# Patient Record
Sex: Male | Born: 1945
Health system: Southern US, Community
[De-identification: ages and names within clinical notes are randomized; demographics above are authoritative.]

## PROBLEM LIST (undated history)

## (undated) DIAGNOSIS — K5903 Drug induced constipation: Secondary | ICD-10-CM

## (undated) DIAGNOSIS — I739 Peripheral vascular disease, unspecified: Secondary | ICD-10-CM

## (undated) DIAGNOSIS — Z720 Tobacco use: Secondary | ICD-10-CM

## (undated) DIAGNOSIS — R0602 Shortness of breath: Secondary | ICD-10-CM

## (undated) DIAGNOSIS — M545 Low back pain, unspecified: Secondary | ICD-10-CM

## (undated) DIAGNOSIS — I10 Essential (primary) hypertension: Secondary | ICD-10-CM

## (undated) DIAGNOSIS — R634 Abnormal weight loss: Secondary | ICD-10-CM

## (undated) DIAGNOSIS — G709 Myoneural disorder, unspecified: Secondary | ICD-10-CM

## (undated) DIAGNOSIS — S81809A Unspecified open wound, unspecified lower leg, initial encounter: Secondary | ICD-10-CM

## (undated) DIAGNOSIS — J449 Chronic obstructive pulmonary disease, unspecified: Secondary | ICD-10-CM

## (undated) DIAGNOSIS — G8929 Other chronic pain: Secondary | ICD-10-CM

## (undated) DIAGNOSIS — J439 Emphysema, unspecified: Secondary | ICD-10-CM

## (undated) DIAGNOSIS — F419 Anxiety disorder, unspecified: Secondary | ICD-10-CM

## (undated) HISTORY — DX: Chronic obstructive pulmonary disease, unspecified: J44.9

## (undated) HISTORY — DX: Low back pain: M54.5

## (undated) HISTORY — DX: Other chronic pain: G89.29

## (undated) HISTORY — PX: OTHER SURGICAL HISTORY: SHX169

## (undated) HISTORY — DX: Peripheral vascular disease, unspecified: I73.9

## (undated) HISTORY — DX: Essential (primary) hypertension: I10

## (undated) HISTORY — DX: Anxiety disorder, unspecified: F41.9

## (undated) HISTORY — DX: Low back pain, unspecified: M54.50

## (undated) HISTORY — DX: Tobacco use: Z72.0

## (undated) HISTORY — DX: Abnormal weight loss: R63.4

---

## 2001-05-19 HISTORY — PX: APPENDECTOMY: SHX54

## 2013-04-09 ENCOUNTER — Ambulatory Visit (HOSPITAL_COMMUNITY)
Admission: RE | Admit: 2013-04-09 | Discharge: 2013-04-09 | Disposition: A | Discharge status: Home / Self Care | Payer: Medicare Other | Source: Ambulatory Visit | Attending: Orthopaedic Surgery | Admitting: Orthopaedic Surgery

## 2013-04-09 ENCOUNTER — Other Ambulatory Visit (HOSPITAL_COMMUNITY): Payer: Self-pay | Admitting: Orthopedic Surgery

## 2013-04-09 DIAGNOSIS — M79671 Pain in right foot: Secondary | ICD-10-CM

## 2013-04-09 DIAGNOSIS — M79609 Pain in unspecified limb: Secondary | ICD-10-CM

## 2013-04-09 DIAGNOSIS — I739 Peripheral vascular disease, unspecified: Secondary | ICD-10-CM | POA: Insufficient documentation

## 2013-04-09 NOTE — Progress Notes (Signed)
VASCULAR LAB PRELIMINARY  ARTERIAL  ABI completed:    RIGHT    LEFT    PRESSURE WAVEFORM  PRESSURE WAVEFORM  BRACHIAL 134 triphasic BRACHIAL 139 triphasic  DP   DP    AT 44 Dampened monophasic AT 79 Dampened monophasic  PT 48 Dampened monophasic PT 30 Dampened monophasic  PER   PER    GREAT TOE  NA GREAT TOE  NA    RIGHT LEFT  ABI 0.35 0.57     Aeon Koors, RVT 04/09/2013, 9:57 AM

## 2013-04-13 ENCOUNTER — Ambulatory Visit (INDEPENDENT_AMBULATORY_CARE_PROVIDER_SITE_OTHER): Payer: Medicare Other | Admitting: Internal Medicine

## 2013-04-13 ENCOUNTER — Encounter: Payer: Self-pay | Admitting: Internal Medicine

## 2013-04-13 VITALS — BP 108/68 | HR 70 | Ht 68.0 in | Wt 136.8 lb

## 2013-04-13 DIAGNOSIS — F419 Anxiety disorder, unspecified: Secondary | ICD-10-CM

## 2013-04-13 DIAGNOSIS — J449 Chronic obstructive pulmonary disease, unspecified: Secondary | ICD-10-CM

## 2013-04-13 DIAGNOSIS — M545 Low back pain, unspecified: Secondary | ICD-10-CM

## 2013-04-13 DIAGNOSIS — I739 Peripheral vascular disease, unspecified: Secondary | ICD-10-CM

## 2013-04-13 DIAGNOSIS — Z139 Encounter for screening, unspecified: Secondary | ICD-10-CM

## 2013-04-13 DIAGNOSIS — F411 Generalized anxiety disorder: Secondary | ICD-10-CM

## 2013-04-13 DIAGNOSIS — I10 Essential (primary) hypertension: Secondary | ICD-10-CM

## 2013-04-13 DIAGNOSIS — F17200 Nicotine dependence, unspecified, uncomplicated: Secondary | ICD-10-CM

## 2013-04-13 HISTORY — DX: Peripheral vascular disease, unspecified: I73.9

## 2013-04-13 NOTE — Patient Instructions (Addendum)
You have been referred to Dr. Nanetta Batty. Please schedule for next week.   Your physician has requested that you have a lower extremity arterial duplex. This test is an ultrasound of the arteries in the legs. It looks at arterial blood flow in the legs. Allow one hour for Lower Arterial scans. There are no restrictions or special instructions  Your physician has requested that you have an abdominal aorta duplex. During this test, an ultrasound is used to evaluate the aorta. Allow 30 minutes for this exam. Do not eat after midnight the day before and avoid carbonated beverages

## 2013-04-13 NOTE — Progress Notes (Signed)
OFFICE NOTE  Chief Complaint:  Leg pain, claudication  Primary Care Physician: No PCP Per Patient  HPI:  Cleon Signorelli is a 67 year old male with a long-standing history of smoking and COPD, hypertension, anxiety, and chronic low back pain managed by a pain clinic. He has been seen recently both at the gym at urgent care and by a podiatrist for foot pain and a sore over the left greater toe. The toe eventually healed but he underwent a surgical procedure to remove part of the nail. The podiatrist prescribed him some medications that he put on topically which caused pain over this so sore on his great toe. He also reports an open sore that came up spontaneously on the sole of the right foot which has since healed. He does report that he's had aching in his calves pain after walking 20-30 feet which improves when he stops. He is on chronic oxycodone as managed by his pain specialist. He has not had any blood work recently. He underwent ABIs recently which showed severe reduction in blood flow bilaterally. He is now referred for evaluation.  PMHx:  Past Medical History  Diagnosis Date  . Chronic low back pain   . COPD (chronic obstructive pulmonary disease)   . Hypertension   . Anxiety     Past Surgical History  Procedure Laterality Date  . Appendectomy    . Abi      04/09/13; abnormal    FAMHx:  No family history on file.  SOCHx:   reports that he has been smoking Cigarettes.  He has a 50 pack-year smoking history. He has never used smokeless tobacco. He reports that he does not drink alcohol or use illicit drugs.  ALLERGIES:  No Known Allergies  ROS: A comprehensive review of systems was negative except for: Cardiovascular: positive for lower extremity edema Musculoskeletal: positive for claudication, leg pain  HOME MEDS: Current Outpatient Prescriptions  Medication Sig Dispense Refill  . ALPRAZolam (XANAX) 1 MG tablet Take 1 mg by mouth 3 (three) times daily.      .  IBUPROFEN PO Take by mouth as needed.      . Oxycodone HCl 10 MG TABS Take 10 mg by mouth every 4 (four) hours.       No current facility-administered medications for this visit.    LABS/IMAGING: No results found for this or any previous visit (from the past 48 hour(s)). No results found.  VITALS: BP 108/68  Pulse 70  Ht 5\' 8"  (1.727 m)  Wt 136 lb 12.8 oz (62.052 kg)  BMI 20.81 kg/m2  EXAM: General appearance: alert and no distress Neck: no adenopathy, no carotid bruit, no JVD, supple, symmetrical, trachea midline and thyroid not enlarged, symmetric, no tenderness/mass/nodules Lungs: clear to auscultation bilaterally Heart: regular rate and rhythm, S1, S2 normal, no murmur, click, rub or gallop Abdomen: soft, non-tender; bowel sounds normal; no masses,  no organomegaly and no pulsatile masses Extremities: edema 1+ right, there is erythema of both feet which are cool, there is a healed sore on the tip of the left great toe. There is a healed ulcer on the sole of the right foot. Pulses: faint, not easily palpable Skin: pale, erythma over the feet Neurologic: Grossly normal  EKG: Sinus rhythm at 70  ASSESSMENT: 1. Symptomatic PAD with claudication, ulcers  PLAN: 1. [name] has significant PAD with pain in both legs. He's been on chronic pain management for low back problems, and probably has some neuropathy as well.  He is ABIs are severely reduced, but he did not have vein mapping. He's also been a smoker and his age of 39, should have a screening abdominal aortic ultrasound as well. I'll go ahead and order this as well as bilateral arterial venous mapping. We'll also obtain a CBC and BMP to evaluate candidacy for peripheral angiography. I plan to refer him to Dr. Allyson Sabal in our practice, who specializes in peripheral arterial disease.  He has been taking aspirin 162 mg daily which I encouraged him to continue. Again he is not reporting any significant rest pain, but does have  lifestyle-limiting claudication. In addition he has sores which are been slow to heal and are in typical arterial ulcer positions.  Chrystie Nose, MD, [facility] Attending Cardiologist [facility]  Javone Ybanez C 04/13/2013, 4:12 PM

## 2013-04-16 ENCOUNTER — Ambulatory Visit (HOSPITAL_COMMUNITY)
Admission: RE | Admit: 2013-04-16 | Discharge: 2013-04-16 | Disposition: A | Discharge status: Home / Self Care | Payer: Medicare Other | Source: Ambulatory Visit | Attending: Cardiovascular Disease | Admitting: Cardiovascular Disease

## 2013-04-16 DIAGNOSIS — F17200 Nicotine dependence, unspecified, uncomplicated: Secondary | ICD-10-CM

## 2013-04-16 DIAGNOSIS — I739 Peripheral vascular disease, unspecified: Secondary | ICD-10-CM | POA: Insufficient documentation

## 2013-04-16 NOTE — Progress Notes (Signed)
Abdominal Aortic Duplex Completed °Brianna L Mazza,RVT °

## 2013-04-16 NOTE — Progress Notes (Signed)
Lower Extremity Arterial Duplex Completed. °Brianna L Mazza,RVT °

## 2013-04-17 LAB — CBC
HCT: 41.7 % (ref 39.0–52.0)
Hemoglobin: 14.4 g/dL (ref 13.0–17.0)
MCHC: 34.5 g/dL (ref 30.0–36.0)
RBC: 4.46 MIL/uL (ref 4.22–5.81)

## 2013-04-17 LAB — BASIC METABOLIC PANEL
BUN: 16 mg/dL (ref 6–23)
CO2: 32 mEq/L (ref 19–32)
Chloride: 102 mEq/L (ref 96–112)
Glucose, Bld: 110 mg/dL — ABNORMAL HIGH (ref 70–99)
Potassium: 4.1 mEq/L (ref 3.5–5.3)
Sodium: 138 mEq/L (ref 135–145)

## 2013-04-21 ENCOUNTER — Telehealth: Payer: Self-pay | Admitting: Internal Medicine

## 2013-04-21 NOTE — Telephone Encounter (Signed)
Would like to know is there anything she should be doing , should he walking or should he be resting because of the pain in the muslces of his legs.. Please Call    Thanks

## 2013-04-21 NOTE — Telephone Encounter (Signed)
Walk as much as he can, stop when it hurts. Has appointment with Dr. Allyson Sabal. Can't do much else for him right now.  -Dr. Rennis Golden

## 2013-04-21 NOTE — Telephone Encounter (Signed)
Reviewed last OV note w/ Dr. Rennis Golden.  Pt to have tests completed and f/u with Dr. Allyson Sabal.  Appt on 9.10.14 w/ Dr. Allyson Sabal.    Message forwarded to Dr. Rennis Golden for further instructions.

## 2013-04-21 NOTE — Telephone Encounter (Signed)
Will await return call.

## 2013-04-28 ENCOUNTER — Ambulatory Visit (INDEPENDENT_AMBULATORY_CARE_PROVIDER_SITE_OTHER): Payer: Medicare Other | Admitting: Cardiovascular Disease

## 2013-04-28 ENCOUNTER — Encounter: Payer: Self-pay | Admitting: Cardiovascular Disease

## 2013-04-28 VITALS — BP 140/90 | HR 80 | Ht 68.0 in | Wt 132.0 lb

## 2013-04-28 DIAGNOSIS — R5381 Other malaise: Secondary | ICD-10-CM

## 2013-04-28 DIAGNOSIS — R5383 Other fatigue: Secondary | ICD-10-CM

## 2013-04-28 DIAGNOSIS — D689 Coagulation defect, unspecified: Secondary | ICD-10-CM

## 2013-04-28 DIAGNOSIS — R3911 Hesitancy of micturition: Secondary | ICD-10-CM

## 2013-04-28 DIAGNOSIS — I739 Peripheral vascular disease, unspecified: Secondary | ICD-10-CM

## 2013-04-28 DIAGNOSIS — R634 Abnormal weight loss: Secondary | ICD-10-CM | POA: Insufficient documentation

## 2013-04-28 DIAGNOSIS — Z01818 Encounter for other preprocedural examination: Secondary | ICD-10-CM

## 2013-04-28 DIAGNOSIS — R0602 Shortness of breath: Secondary | ICD-10-CM

## 2013-04-28 DIAGNOSIS — Z79899 Other long term (current) drug therapy: Secondary | ICD-10-CM

## 2013-04-28 DIAGNOSIS — F17200 Nicotine dependence, unspecified, uncomplicated: Secondary | ICD-10-CM

## 2013-04-28 DIAGNOSIS — Z720 Tobacco use: Secondary | ICD-10-CM

## 2013-04-28 NOTE — Assessment & Plan Note (Signed)
Patient has a 2 to three-month history of lifestyle limiting claudication. He has critical limb ischemia with painful feet and nonhealing ulcer on his left great toe as well as the dorsum of his right foot. He had arterial Dopplers performed in our office 04/16/13 revealing an ABI of point in the right with an occluded distal right SFA popliteal artery a right ABI of 0.38 with an occluded left SFA and popliteal artery.I'm going to obtain a pharmacologic Myoview stress test to stratify him prior to a cholangiogram and potential endovascular intervention for critical limb ischemia.

## 2013-04-28 NOTE — Progress Notes (Signed)
04/28/2013 Si Jachim   04/01/1946  [id number]  Primary Physician No PCP Per Patient Primary Cardiologist: Runell Gess MD [name]   HPI:  Christopher Pena is a 67 year old thin and cachectic appearing divorced Caucasian male father of 2, grandfather 57 grandchildren is a retired Customer service manager. He is referred by Dr. Rennis Golden for cardiovascular evaluation. Factors include 50-100-pack-year history of tobacco use having smoked one pack a day recently and tried to stop the cigarettes. He does have hypertension. He's never had a heart attack or stroke. He does of COPD. Colestid 2 months that is lifestyle limiting claudication which has gotten worse now unable to walk more than 20 yards. He does have resting pain at night. Dopplers performed in office on 04/16/13 revealed a right ABI 0.23 and left appropriate suggesting critical limb ischemia.   Current Outpatient Prescriptions  Medication Sig Dispense Refill  . ALPRAZolam (XANAX) 1 MG tablet Take 1 mg by mouth 3 (three) times daily.      . IBUPROFEN PO Take by mouth as needed.      . Oxycodone HCl 10 MG TABS Take 10 mg by mouth every 4 (four) hours.       No current facility-administered medications for this visit.    No Known Allergies  History   Social History  . Marital Status: Divorced    Spouse Name: N/A    Number of Children: N/A  . Years of Education: N/A   Occupational History  . former golf pro    Social History Main Topics  . Smoking status: Current Every Day Smoker -- 1.00 packs/day for 50 years    Types: Cigarettes  . Smokeless tobacco: Never Used  . Alcohol Use: No  . Drug Use: No  . Sexual Activity: Not on file   Other Topics Concern  . Not on file   Social History Narrative  . No narrative on file     Review of Systems: General: negative for chills, fever, night sweats or weight changes.  Cardiovascular: negative for chest pain, dyspnea on exertion, edema, orthopnea, palpitations,  paroxysmal nocturnal dyspnea or shortness of breath Dermatological: negative for rash Respiratory: negative for cough or wheezing Urologic: negative for hematuria Abdominal: negative for nausea, vomiting, diarrhea, bright red blood per rectum, melena, or hematemesis Neurologic: negative for visual changes, syncope, or dizziness All other systems reviewed and are otherwise negative except as noted above.    Blood pressure 140/90, pulse 80, height 5\' 8"  (1.727 m), weight 132 lb (59.875 kg).  General appearance: alert and no distress Neck: no adenopathy, no carotid bruit, no JVD, supple, symmetrical, trachea midline and thyroid not enlarged, symmetric, no tenderness/mass/nodules Lungs: clear to auscultation bilaterally Heart: regular rate and rhythm, S1, S2 normal, no murmur, click, rub or gallop Abdomen: soft, non-tender; bowel sounds normal; no masses,  no organomegaly and 8 hernia from a previous appendectomy in the right lower quadrant Extremities: dependent rubor bilaterally with absent pedal pulses Pulses: 2+ and symmetric absent pedal pulses  EKG not performed today  ASSESSMENT AND PLAN:   PAD (peripheral artery disease) Patient has a 2 to three-month history of lifestyle limiting claudication. He has critical limb ischemia with painful feet and nonhealing ulcer on his left great toe as well as the dorsum of his right foot. He had arterial Dopplers performed in our office 04/16/13 revealing an ABI of point in the right with an occluded distal right SFA popliteal artery a right ABI of 0.38 with an occluded left SFA  and popliteal artery.I'm going to obtain a pharmacologic Myoview stress test to stratify him prior to a cholangiogram and potential endovascular intervention for critical limb ischemia.      Runell Gess MD FACP,FACC,FAHA, [facility] 04/28/2013 4:05 PM

## 2013-04-28 NOTE — Patient Instructions (Signed)
Dr. Allyson Sabal has ordered a peripheral angiogram to be done at [facility].  This procedure is going to look at the bloodflow in your lower extremities.  If Dr. Allyson Sabal is able to open up the arteries, you will have to spend one night in the hospital.  If he is not able to open the arteries, you will be able to go home that same day.    After the procedure, you will not be allowed to drive for 3 days or push, pull, or lift anything greater than 10 lbs for one week.    You will be required to have bloodwork and a chest xray prior to your procedure.  Our scheduler will advise you on when these items need to be done.       REPS: Scott and Tara Right groin  Dr Allyson Sabal would like for you to have a lexiscan myoview prior to the pv angiogram

## 2013-05-04 ENCOUNTER — Ambulatory Visit
Admission: RE | Admit: 2013-05-04 | Discharge: 2013-05-04 | Disposition: A | Discharge status: Home / Self Care | Payer: Medicare Other | Source: Ambulatory Visit | Attending: Cardiovascular Disease | Admitting: Cardiovascular Disease

## 2013-05-04 DIAGNOSIS — Z720 Tobacco use: Secondary | ICD-10-CM

## 2013-05-04 LAB — CBC
MCH: 32.2 pg (ref 26.0–34.0)
Platelets: 246 10*3/uL (ref 150–400)
RBC: 4.82 MIL/uL (ref 4.22–5.81)
RDW: 13.5 % (ref 11.5–15.5)
WBC: 10.3 10*3/uL (ref 4.0–10.5)

## 2013-05-04 LAB — BASIC METABOLIC PANEL
BUN: 20 mg/dL (ref 6–23)
Creat: 0.84 mg/dL (ref 0.50–1.35)

## 2013-05-04 LAB — PROTIME-INR: INR: 1.09 (ref <1.50)

## 2013-05-04 LAB — APTT: aPTT: 34 seconds (ref 24–37)

## 2013-05-05 ENCOUNTER — Encounter (HOSPITAL_COMMUNITY): Payer: Self-pay | Admitting: Pharmacy Technician

## 2013-05-05 LAB — TSH: TSH: 1.027 u[IU]/mL (ref 0.350–4.500)

## 2013-05-06 ENCOUNTER — Ambulatory Visit (HOSPITAL_COMMUNITY)
Admission: RE | Admit: 2013-05-06 | Discharge: 2013-05-06 | Disposition: A | Discharge status: Home / Self Care | Payer: Medicare Other | Source: Ambulatory Visit | Attending: Cardiology | Admitting: Cardiology

## 2013-05-06 ENCOUNTER — Telehealth: Payer: Self-pay | Admitting: *Deleted

## 2013-05-06 DIAGNOSIS — R0602 Shortness of breath: Secondary | ICD-10-CM

## 2013-05-06 MED ORDER — TECHNETIUM TC 99M SESTAMIBI GENERIC - CARDIOLITE
30.0000 | Freq: Once | INTRAVENOUS | Status: AC | PRN
  Administered 2013-05-06: 30 via INTRAVENOUS

## 2013-05-06 MED ORDER — TECHNETIUM TC 99M SESTAMIBI GENERIC - CARDIOLITE
10.0000 | Freq: Once | INTRAVENOUS | Status: AC | PRN
  Administered 2013-05-06: 10 via INTRAVENOUS

## 2013-05-06 MED ORDER — REGADENOSON 0.4 MG/5ML IV SOLN
0.4000 mg | Freq: Once | INTRAVENOUS | Status: AC
  Administered 2013-05-06: 0.4 mg via INTRAVENOUS

## 2013-05-06 MED ORDER — AMINOPHYLLINE 25 MG/ML IV SOLN
75.0000 mg | Freq: Once | INTRAVENOUS | Status: AC
  Administered 2013-05-06: 75 mg via INTRAVENOUS

## 2013-05-06 NOTE — Procedures (Addendum)
[facility] [address] [place] [address] [phone]  Cardiology Nuclear Med Christopher Pena  Christopher Pena is a 67 y.o. male     MRN : [id number]     DOB: February 16, 1946  Procedure Date: 05/06/2013  Nuclear Med Background Indication for Stress Test:  Surgical Clearance History:  COPD Cardiac Risk Factors: Family History - CAD, Hypertension, PVD and Smoker  Symptoms:  DOE, Fatigue and SOB   Nuclear Pre-Procedure Caffeine/Decaff Intake:  10:00pm NPO After: 8:00am   IV Site: R Hand  IV 0.9% NS with Angio Cath:  22g  Chest Size (in):  36"  IV Started by: Emmit Pomfret, RN  Height: 5\' 8"  (1.727 m)  Cup Size: n/a  BMI:  Body mass index is 20.08 kg/(m^2). Weight:  132 lb (59.875 kg)   Tech Comments:  N/A    Nuclear Med Study 1 or 2 day study: 1 day  Stress Test Type:  Lexiscan  Order Authorizing Provider:  Nanetta Batty, MD   Resting Radionuclide: Technetium 72m Sestamibi  Resting Radionuclide Dose: 10.6 mCi   Stress Radionuclide:  Technetium 30m Sestamibi  Stress Radionuclide Dose: 32.8 mCi           Stress Protocol Rest HR: 82 Stress HR: 108  Rest BP: 133/87 Stress BP: 141/88  Exercise Time (min): n/a METS: n/a          Dose of Adenosine (mg):  n/a Dose of Lexiscan: 0.4 mg  Dose of Atropine (mg): n/a Dose of Dobutamine: n/a mcg/kg/min (at max HR)  Stress Test Technologist: Christopher Pena, CCT Nuclear Technologist: Christopher Pena, CNMT   Rest Procedure:  Myocardial perfusion imaging was performed at rest 45 minutes following the intravenous administration of Technetium 58m Sestamibi. Stress Procedure:  The patient received IV Lexiscan 0.4 mg over 15-seconds.  Technetium 60m Sestamibi injected at 30-seconds.  Due to patient's shortness of breath, he was given IV Aminophylline 75 mg. Symptom was resolved during recovery. There were no significant changes with Lexiscan.  Quantitative spect images were obtained after a 45  minute delay.  Transient Ischemic Dilatation (Normal <1.22):  0.94 Lung/Heart Ratio (Normal <0.45):  0.27 QGS EDV:  52 ml QGS ESV:  13 ml LV Ejection Fraction: 74%  Rest ECG: NSR - Normal EKG  Stress ECG: No significant change from baseline ECG  QPS Raw Data Images:  Normal; no motion artifact; normal heart/lung ratio. Stress Images:  Dense fixed septal and apical lateral defects, could represent scar Rest Images:  Marked global uptake of radiotracer with fixed septal and apical lateral defects Subtraction (SDS):  No evidence of ischemia.  Impression Exercise Capacity:  Lexiscan with no exercise. BP Response:  Hypotensive blood pressure response. Clinical Symptoms:  There is dyspnea. ECG Impression:  No significant ECG changes with Lexiscan. Comparison with Prior Nuclear Study: No previous nuclear study performed  Overall Impression:  Low risk stress nuclear study with fixed septal and apical lateral defects. There is reverse redistribution and poor radiotracer uptake. No obvious reversible ischemia, but clinical correlation is advised.  LV Wall Motion:  NL LV Function; NL Wall Motion; EF 74%  Christopher Nose, MD, [facility] Board Certified in Nuclear Cardiology Attending Cardiologist [facility]  Christopher Nose, MD  05/06/2013 12:55 PM

## 2013-05-06 NOTE — Telephone Encounter (Signed)
Application for disability parking placard form filled out and signed. Given to patient.

## 2013-05-10 ENCOUNTER — Ambulatory Visit (HOSPITAL_COMMUNITY)
Admission: RE | Admit: 2013-05-10 | Discharge: 2013-05-11 | Disposition: A | Discharge status: Home / Self Care | Payer: Medicare Other | Source: Ambulatory Visit | Attending: Cardiovascular Disease | Admitting: Cardiovascular Disease

## 2013-05-10 ENCOUNTER — Encounter (HOSPITAL_COMMUNITY): Payer: Self-pay | Admitting: *Deleted

## 2013-05-10 ENCOUNTER — Encounter (HOSPITAL_COMMUNITY)
Admission: RE | Disposition: A | Discharge status: Home / Self Care | Payer: Self-pay | Source: Ambulatory Visit | Attending: Cardiovascular Disease

## 2013-05-10 DIAGNOSIS — J4489 Other specified chronic obstructive pulmonary disease: Secondary | ICD-10-CM | POA: Insufficient documentation

## 2013-05-10 DIAGNOSIS — I739 Peripheral vascular disease, unspecified: Secondary | ICD-10-CM

## 2013-05-10 DIAGNOSIS — I7092 Chronic total occlusion of artery of the extremities: Secondary | ICD-10-CM | POA: Insufficient documentation

## 2013-05-10 DIAGNOSIS — R64 Cachexia: Secondary | ICD-10-CM | POA: Insufficient documentation

## 2013-05-10 DIAGNOSIS — Z01818 Encounter for other preprocedural examination: Secondary | ICD-10-CM

## 2013-05-10 DIAGNOSIS — Z7982 Long term (current) use of aspirin: Secondary | ICD-10-CM | POA: Diagnosis not present

## 2013-05-10 DIAGNOSIS — Z7902 Long term (current) use of antithrombotics/antiplatelets: Secondary | ICD-10-CM | POA: Insufficient documentation

## 2013-05-10 DIAGNOSIS — F17200 Nicotine dependence, unspecified, uncomplicated: Secondary | ICD-10-CM | POA: Insufficient documentation

## 2013-05-10 DIAGNOSIS — I700 Atherosclerosis of aorta: Secondary | ICD-10-CM | POA: Diagnosis not present

## 2013-05-10 DIAGNOSIS — J449 Chronic obstructive pulmonary disease, unspecified: Secondary | ICD-10-CM | POA: Insufficient documentation

## 2013-05-10 DIAGNOSIS — I70229 Atherosclerosis of native arteries of extremities with rest pain, unspecified extremity: Secondary | ICD-10-CM | POA: Insufficient documentation

## 2013-05-10 DIAGNOSIS — Z79899 Other long term (current) drug therapy: Secondary | ICD-10-CM | POA: Diagnosis not present

## 2013-05-10 DIAGNOSIS — I70219 Atherosclerosis of native arteries of extremities with intermittent claudication, unspecified extremity: Secondary | ICD-10-CM

## 2013-05-10 DIAGNOSIS — I10 Essential (primary) hypertension: Secondary | ICD-10-CM

## 2013-05-10 HISTORY — PX: FEMORAL ARTERY STENT: SHX1583

## 2013-05-10 HISTORY — PX: ABDOMINAL AORTAGRAM: SHX5454

## 2013-05-10 HISTORY — PX: PERCUTANEOUS STENT INTERVENTION: SHX5500

## 2013-05-10 HISTORY — PX: LOWER EXTREMITY ANGIOGRAM: SHX5508

## 2013-05-10 LAB — POCT ACTIVATED CLOTTING TIME
Activated Clotting Time: 211 seconds
Activated Clotting Time: 150 seconds

## 2013-05-10 SURGERY — PERCUTANEOUS STENT INTERVENTION
Laterality: Right

## 2013-05-10 MED ORDER — ALPRAZOLAM 0.5 MG PO TABS
1.0000 mg | ORAL_TABLET | Freq: Three times a day (TID) | ORAL | Status: DC
  Administered 2013-05-10: 1 mg via ORAL
  Filled 2013-05-10: qty 2

## 2013-05-10 MED ORDER — ASPIRIN 81 MG PO CHEW
CHEWABLE_TABLET | ORAL | Status: AC
  Filled 2013-05-10: qty 4

## 2013-05-10 MED ORDER — CLOPIDOGREL BISULFATE 300 MG PO TABS
ORAL_TABLET | ORAL | Status: AC
  Filled 2013-05-10: qty 1

## 2013-05-10 MED ORDER — ONDANSETRON HCL 4 MG/2ML IJ SOLN: 4.0000 mg | Freq: Four times a day (QID) | INTRAMUSCULAR | Status: DC | PRN

## 2013-05-10 MED ORDER — NICOTINE 7 MG/24HR TD PT24
7.0000 mg | MEDICATED_PATCH | Freq: Every day | TRANSDERMAL | Status: DC
  Administered 2013-05-10 – 2013-05-11 (×2): 7 mg via TRANSDERMAL
  Filled 2013-05-10 (×2): qty 1

## 2013-05-10 MED ORDER — OXYCODONE HCL 5 MG PO TABS
10.0000 mg | ORAL_TABLET | ORAL | Status: DC
  Administered 2013-05-10 – 2013-05-11 (×5): 10 mg via ORAL
  Filled 2013-05-10 (×5): qty 2

## 2013-05-10 MED ORDER — SODIUM CHLORIDE 0.9 % IV SOLN
INTRAVENOUS | Status: AC
  Administered 2013-05-10: 23:00:00 via INTRAVENOUS

## 2013-05-10 MED ORDER — DIAZEPAM 5 MG PO TABS
ORAL_TABLET | ORAL | Status: AC
  Filled 2013-05-10: qty 1

## 2013-05-10 MED ORDER — FENTANYL CITRATE 0.05 MG/ML IJ SOLN
INTRAMUSCULAR | Status: AC
  Filled 2013-05-10: qty 2

## 2013-05-10 MED ORDER — DIAZEPAM 5 MG PO TABS
5.0000 mg | ORAL_TABLET | ORAL | Status: AC
  Administered 2013-05-10: 5 mg via ORAL

## 2013-05-10 MED ORDER — LIDOCAINE HCL (PF) 1 % IJ SOLN
INTRAMUSCULAR | Status: AC
  Filled 2013-05-10: qty 30

## 2013-05-10 MED ORDER — HEPARIN (PORCINE) IN NACL 2-0.9 UNIT/ML-% IJ SOLN
INTRAMUSCULAR | Status: AC
  Filled 2013-05-10: qty 1000

## 2013-05-10 MED ORDER — MORPHINE SULFATE 2 MG/ML IJ SOLN: 2.0000 mg | INTRAMUSCULAR | Status: DC | PRN

## 2013-05-10 MED ORDER — ASPIRIN 81 MG PO CHEW
324.0000 mg | CHEWABLE_TABLET | ORAL | Status: AC
  Administered 2013-05-10: 324 mg via ORAL

## 2013-05-10 MED ORDER — SODIUM CHLORIDE 0.9 % IV SOLN
INTRAVENOUS | Status: DC
  Administered 2013-05-10: 11:00:00 via INTRAVENOUS

## 2013-05-10 MED ORDER — HEPARIN SODIUM (PORCINE) 1000 UNIT/ML IJ SOLN
INTRAMUSCULAR | Status: AC
  Filled 2013-05-10: qty 1

## 2013-05-10 MED ORDER — ACETAMINOPHEN 325 MG PO TABS: 650.0000 mg | ORAL_TABLET | ORAL | Status: DC | PRN

## 2013-05-10 MED ORDER — IBUPROFEN 800 MG PO TABS
800.0000 mg | ORAL_TABLET | Freq: Three times a day (TID) | ORAL | Status: DC | PRN
  Administered 2013-05-10: 800 mg via ORAL
  Filled 2013-05-10: qty 1

## 2013-05-10 MED ORDER — SODIUM CHLORIDE 0.9 % IJ SOLN: 3.0000 mL | INTRAMUSCULAR | Status: DC | PRN

## 2013-05-10 MED ORDER — ASPIRIN EC 81 MG PO TBEC
81.0000 mg | DELAYED_RELEASE_TABLET | Freq: Every day | ORAL | Status: DC
Start: 2013-05-11 — End: 2013-05-11
  Filled 2013-05-10: qty 1

## 2013-05-10 MED ORDER — MIDAZOLAM HCL 2 MG/2ML IJ SOLN
INTRAMUSCULAR | Status: AC
  Filled 2013-05-10: qty 2

## 2013-05-10 NOTE — H&P (Signed)
    Pt was reexamined and existing H & P reviewed. No changes found.  Runell Gess, MD [facility] 05/10/2013 12:40 PM

## 2013-05-10 NOTE — CV Procedure (Signed)
Brennon Otterness is a 67 y.o. male    [id number] LOCATION:  FACILITY: [facility]  PHYSICIAN: Nanetta Batty, M.D. 17-Jan-1946   DATE OF PROCEDURE:  05/10/2013  DATE OF DISCHARGE:   PV Intervention    History obtained from chart review.Mr. Cornia is a 67 year old thin and cachectic appearing divorced Caucasian male father of 2, grandfather 29 grandchildren is a retired Customer service manager. He is referred by Dr. Rennis Golden for cardiovascular evaluation. Factors include 50-100-pack-year history of tobacco use having smoked one pack a day recently and tried to stop the cigarettes. He does have hypertension. He's never had a heart attack or stroke. He does of COPD. Colestid 2 months that is lifestyle limiting claudication which has gotten worse now unable to walk more than 20 yards. He does have resting pain at night. Dopplers performed in office on 04/16/13 revealed a right ABI 0.23 and left appropriate suggesting critical limb ischemia.    PROCEDURE DESCRIPTION:    The patient was brought to the second floor [facility] cath lab in the postabsorptive state. He was premedicated with Valium 5 mg by mouth, IV Versed and fentanyl. His left groinwas prepped and shaved in usual sterile fashion. Xylocaine 1% was used for local anesthesia. A 5 French sheath was inserted into the left common femoral artery using standard Seldinger technique. A 5 French pigtail catheters used for abdominal aortography, bilateral iliac angiography and bifemoral runoff using bolus chase digital subtraction step table technique. Visipaque dye was used for the entirety of the case. Retrograde aortic pressure was monitored during the case.   HEMODYNAMICS:    AO SYSTOLIC/AO DIASTOLIC: 146/76    ANGIOGRAPHIC RESULTS:   1: Abdominal aortogram-the distal abdominal aorta was mildly atherosclerotic nonaneurysmal nor obstructive.  2: Left lower extremity-there is a moderately long segment occlusion beginning in the mid left SFA and  extending into the P1/P2 and P3 segments. There is two-vessel runoff at the posterior tibial  3: Right lower extremity-there was a chronic total occlusion beginning mid right SFA extending into the P1/P2 and P3 segments with three-vessel runoff  IMPRESSION:Mr. Taras has multilevel disease beginning in both his SFA and popliteal arteries. He has critical limb ischemia with rest pain and nonhealing ulcers. His right leg is more symptomatic than the left. We'll proceed with attempt at percutaneous revascularization.  Procedure description: Contralateral access obtained with a 5 French crossover catheter, 035 Versicore  wire and a 7 French/55 cm Ansel sheath. The patient received 9500 units of heparin in total with the contrast of 240 cc administered to the patient. The final ACT was 237. A Viance catheter was used to cross the COL on with an 014/300 cm length [place] core wire. This cross the lesion with little difficulty and rate entered the below-the-knee popliteal artery. I demonstrated I was intervascular. I then performed PTCA with a 5 mm x 120 mm long chocolate balloon throughout the entirety of the CTO. I then ballooned the mid and distal SFA and P1 and P2 segments with a 6 mm x 120 mm long balloon and stented the SFA, P1 and P2 segment with 5.5 mm x 150 mm long IDEV stent/5.5 mm x 40 mm long IDEV stent. The final angiographic result with reduction of a long chronic total occlusion to 0% residual in the SFA, P1, P2 and P3 segments with three-vessel runoff. There was a 30-40% stenosis in the P2 segment just beyond the distal end of the stent as well as a 50-60% stenosis in the proximal right SFA.  The sheath was then withdrawn across the bifurcation and exchanged over wire for a short 7 French sheath which was sutured in place.  Final impression: Successful PTA and stenting of a long right SFA and popliteal CT though with the Viance crossing catheter, chocolate balloon and IDEV Stent for critical limb  ischemia. The patient did receive 300 mg of by mouth Plavix. The sheath will be removed once the ACT falls below 170 and pressure will be held on the groin to achieve hemostasis. The patient left the lab in stable condition. He'll be hydrated overnight, discharged home in the morning we'll get followup arterial Dopplers after which he'll see me back in the office. We will then talk about staging his left SFA/popliteal intervention.  Runell Gess MD, [facility] 05/10/2013 2:25 PM

## 2013-05-10 NOTE — Progress Notes (Signed)
Site area: left groin  Site Prior to Removal:  Level 0  Pressure Applied For 20 MINUTES    Minutes Beginning at 1830  Manual:   yes  Patient Status During Pull:  stable  Post Pull Groin Site:  Level 0  Post Pull Instructions Given:  yes  Post Pull Pulses Present:  yes  Dressing Applied:  yes  Comments:

## 2013-05-10 NOTE — Progress Notes (Signed)
Pt sleeping soundly, awakened to loud voice.  Noncompliant with keeping rt leg straight.  Pt cursing at RN, states nobody gave him anything for pain.  Day RN held scheduled dose of oxycodone due to lethargy.  BP was down to 97/48, SB 45-60.  Explained to patient that pain meds cannot safely be given to one who is too drowsy to stay awake long enough to eat.  Meal and oxycodone given when pt finally remained awake.  Left groin level 0, DP pulses present per doppler.  Reviewed bedrest w/ left leg straight until 2300.  Pt calmed, ate, then went back to sleep.  Pt answered some questions but not agreeable to complete full health history admission questions.  Bed exit alarm on, call light in reach. VSS.

## 2013-05-11 ENCOUNTER — Encounter: Payer: Self-pay | Admitting: Cardiovascular Disease

## 2013-05-11 ENCOUNTER — Other Ambulatory Visit: Payer: Self-pay | Admitting: Physician Assistant

## 2013-05-11 ENCOUNTER — Encounter: Payer: Self-pay | Admitting: *Deleted

## 2013-05-11 DIAGNOSIS — I739 Peripheral vascular disease, unspecified: Secondary | ICD-10-CM

## 2013-05-11 DIAGNOSIS — I70229 Atherosclerosis of native arteries of extremities with rest pain, unspecified extremity: Secondary | ICD-10-CM | POA: Diagnosis not present

## 2013-05-11 DIAGNOSIS — Z01818 Encounter for other preprocedural examination: Secondary | ICD-10-CM

## 2013-05-11 LAB — CBC
HCT: 36.5 % — ABNORMAL LOW (ref 39.0–52.0)
MCH: 32.7 pg (ref 26.0–34.0)
MCHC: 35.3 g/dL (ref 30.0–36.0)
RDW: 13.4 % (ref 11.5–15.5)

## 2013-05-11 LAB — BASIC METABOLIC PANEL
BUN: 12 mg/dL (ref 6–23)
Chloride: 100 mEq/L (ref 96–112)
Creatinine, Ser: 0.78 mg/dL (ref 0.50–1.35)
GFR calc Af Amer: >90 mL/min (ref >90)
GFR calc non Af Amer: >90 mL/min (ref >90)
Glucose, Bld: 94 mg/dL (ref 70–99)

## 2013-05-11 MED ORDER — CLOPIDOGREL BISULFATE 75 MG PO TABS: 75.0000 mg | ORAL_TABLET | Freq: Every day | ORAL | Status: DC

## 2013-05-11 NOTE — Progress Notes (Signed)
    Subjective: Slept well  Objective: Vital signs in last 24 hours: Temp:  [97.5 F (36.4 C)-98 F (36.7 C)] 98 F (36.7 C) (09/23 0521) Pulse Rate:  [60-77] 63 (09/23 0521) Resp:  [16-18] 18 (09/23 0521) BP: (93-164)/(48-90) 93/64 mmHg (09/23 0521) SpO2:  [97 %-100 %] 97 % (09/23 0521) Weight:  [127 lb 3.3 oz (57.7 kg)-135 lb (61.236 kg)] 127 lb 3.3 oz (57.7 kg) (09/23 0050) Last BM Date: 05/09/13  Intake/Output from previous day: 09/22 0701 - 09/23 0700 In: 1135 [P.O.:440; I.V.:695] Out: 550 [Urine:550] Intake/Output this shift:    Medications Current Facility-Administered Medications  Medication Dose Route Frequency Provider Last Rate Last Dose  . acetaminophen (TYLENOL) tablet 650 mg  650 mg Oral Q4H PRN Runell Gess, MD      . ALPRAZolam Prudy Feeler) tablet 1 mg  1 mg Oral TID Runell Gess, MD   1 mg at 05/10/13 1656  . aspirin EC tablet 81 mg  81 mg Oral Daily Runell Gess, MD      . ibuprofen (ADVIL,MOTRIN) tablet 800 mg  800 mg Oral Q8H PRN Runell Gess, MD   800 mg at 05/10/13 1649  . morphine 2 MG/ML injection 2 mg  2 mg Intravenous Q1H PRN Runell Gess, MD      . nicotine (NICODERM CQ - dosed in mg/24 hr) patch 7 mg  7 mg Transdermal Daily Brittainy Simmons, PA-C   7 mg at 05/10/13 2101  . ondansetron (ZOFRAN) injection 4 mg  4 mg Intravenous Q6H PRN Runell Gess, MD      . oxyCODONE (Oxy IR/ROXICODONE) immediate release tablet 10 mg  10 mg Oral Q4H Runell Gess, MD   10 mg at 05/11/13 0535    PE: General appearance: alert, cooperative and no distress Lungs: clear to auscultation bilaterally Heart: regular rate and rhythm, S1, S2 normal, no murmur, click, rub or gallop Extremities: No LEE Pulses: 2+ radials.  2+ right PT. 0 Right-DP.  1+ Left DP. 2+ Left PT Skin: LEft groin:  no hematoma, ecchymosis or erythema.  Nontender.  Right foot:  Very warm with mild erythema.  ~4cm scabbed lesion on the medial aspect of the forefoot. Neurologic:  Grossly normal  Lab Results:   Recent Labs  05/11/13 0615  WBC 6.8  HGB 12.9*  HCT 36.5*  PLT 160     Assessment/Plan   Principal Problem:   Claudication Active Problems:   PAD (peripheral artery disease)   HTN (hypertension)  Plan:  S/P successful PTA and stenting of a long right SFA and popliteal.  LEA dopplers in two weeks.  Ok for DC home.      LOS: 1 day    HAGER, BRYAN 05/11/2013 7:44 AM   Agree with note written by Jones Skene PAC  S/P RSFA/Popliteal PTA/Stent with IDEV stent for CLI. Left groin OK. Right foot warm and moderately erythematous. Suspect this is secondary to increased blood flow. Labs IK. D/C home on ASA and Plavix. LEA then ROV. Staged LSFA intervention.   Runell Gess 05/11/2013 8:41 AM

## 2013-05-11 NOTE — Discharge Summary (Signed)
Physician Discharge Summary  Patient ID: Christopher Pena MRN: [id number] DOB/AGE: Jun 07, 1946 67 y.o.  Admit date: 05/10/2013 Discharge date: 05/11/2013  Admission Diagnoses:  Claudication, PAD  Discharge Diagnoses:  Principal Problem:   Claudication Active Problems:   PAD (peripheral artery disease)   HTN (hypertension)   Discharged Condition: stable  Hospital Course:  Christopher Pena is a 67 year old thin and cachectic appearing divorced Caucasian male father of 2, grandfather 67 grandchildren is a retired Customer service manager. He is referred by Dr. Rennis Golden for cardiovascular evaluation. Factors include 50-100-pack-year history of tobacco use having smoked one pack a day recently and tried to stop the cigarettes. He does have hypertension. He's never had a heart attack or stroke. He does of COPD.  Lifestyle limiting claudication which has gotten worse now unable to walk more than 20 yards. He does have resting pain at night. Dopplers performed in office on 04/16/13 revealed a right ABI 0.23 and left appropriate suggesting critical limb ischemia.  The patient presented for PV angiogram and underwent Successful PTA and stenting of a long right SFA and popliteal CT though with the Viance crossing catheter, chocolate balloon and IDEV Stent for critical limb ischemia. The patient did receive 300 mg of by mouth Plavix and was started on 75mg  PO daily.  The patient was seen by Dr. Allyson Sabal who felt he was stable for DC home.  He will be scheduled for LEA dopplers in 2-3 weeks and follow-up with Dr. Allyson Sabal after that.    Consults: None  Significant Diagnostic Studies: PROCEDURE DESCRIPTION:  The patient was brought to the second floor [facility] lab in the postabsorptive state. He was premedicated with Valium 5 mg by mouth, IV Versed and fentanyl. His left groinwas prepped and shaved in usual sterile fashion. Xylocaine 1% was used for local anesthesia. A 5 French sheath was inserted into the left  common femoral artery using standard Seldinger technique. A 5 French pigtail catheters used for abdominal aortography, bilateral iliac angiography and bifemoral runoff using bolus chase digital subtraction step table technique. Visipaque dye was used for the entirety of the case. Retrograde aortic pressure was monitored during the case.  HEMODYNAMICS:  AO SYSTOLIC/AO DIASTOLIC: 146/76  ANGIOGRAPHIC RESULTS:  1: Abdominal aortogram-the distal abdominal aorta was mildly atherosclerotic nonaneurysmal nor obstructive.  2: Left lower extremity-there is a moderately long segment occlusion beginning in the mid left SFA and extending into the P1/P2 and P3 segments. There is two-vessel runoff at the posterior tibial  3: Right lower extremity-there was a chronic total occlusion beginning mid right SFA extending into the P1/P2 and P3 segments with three-vessel runoff  IMPRESSION:Christopher Pena has multilevel disease beginning in both his SFA and popliteal arteries. He has critical limb ischemia with rest pain and nonhealing ulcers. His right leg is more symptomatic than the left. We'll proceed with attempt at percutaneous revascularization.  Procedure description: Contralateral access obtained with a 5 French crossover catheter, 035 Versicore wire and a 7 French/55 cm Ansel sheath. The patient received 9500 units of heparin in total with the contrast of 240 cc administered to the patient. The final ACT was 237. A Viance catheter was used to cross the COL on with an 014/300 cm length [place] core wire. This cross the lesion with little difficulty and rate entered the below-the-knee popliteal artery. I demonstrated I was intervascular. I then performed PTCA with a 5 mm x 120 mm long chocolate balloon throughout the entirety of the CTO. I then ballooned the mid and  distal SFA and P1 and P2 segments with a 6 mm x 120 mm long balloon and stented the SFA, P1 and P2 segment with 5.5 mm x 150 mm long IDEV stent/5.5 mm x 40 mm long  IDEV stent. The final angiographic result with reduction of a long chronic total occlusion to 0% residual in the SFA, P1, P2 and P3 segments with three-vessel runoff. There was a 30-40% stenosis in the P2 segment just beyond the distal end of the stent as well as a 50-60% stenosis in the proximal right SFA. The sheath was then withdrawn across the bifurcation and exchanged over wire for a short 7 French sheath which was sutured in place.  Final impression: Successful PTA and stenting of a long right SFA and popliteal CT though with the Viance crossing catheter, chocolate balloon and IDEV Stent for critical limb ischemia. The patient did receive 300 mg of by mouth Plavix. The sheath will be removed once the ACT falls below 170 and pressure will be held on the groin to achieve hemostasis. The patient left the lab in stable condition. He'll be hydrated overnight, discharged home in the morning we'll get followup arterial Dopplers after which he'll see me back in the office. We will then talk about staging his left SFA/popliteal intervention.  Runell Gess MD, [facility]  05/10/2013  Treatments:  See above.  Discharge Exam: Blood pressure 111/59, pulse 71, temperature 97.7 F (36.5 C), temperature source Oral, resp. rate 18, height 5\' 8"  (1.727 m), weight 127 lb 3.3 oz (57.7 kg), SpO2 98.00%.   Disposition: 01-Home or Self Care      Discharge Orders   Future Orders Complete By Expires   Diet - low sodium heart healthy  As directed    Discharge instructions  As directed    Comments:     No lifting more than a half gallon of milk or driving for three days.   Increase activity slowly  As directed        Medication List         ALPRAZolam 1 MG tablet  Commonly known as:  XANAX  Take 1 mg by mouth 3 (three) times daily.     aspirin EC 81 MG tablet  Take 81 mg by mouth See admin instructions. Patient takes 1 tablet every morning and occasionally takes one in the evening as needed for pain.      clopidogrel 75 MG tablet  Commonly known as:  PLAVIX  Take 1 tablet (75 mg total) by mouth daily.     ibuprofen 800 MG tablet  Commonly known as:  ADVIL,MOTRIN  Take 800 mg by mouth every 8 (eight) hours as needed for pain.     Oxycodone HCl 10 MG Tabs  Take 10 mg by mouth every 4 (four) hours.       Follow-up Information   Follow up with Runell Gess, MD. (Our office scheduler will call you with the appt date and time.)    Specialty:  Cardiology   Contact information:   [address] [place] [address] [phone]       Signed: Wilburt Finlay 05/11/2013, 11:00 AM

## 2013-05-19 ENCOUNTER — Encounter: Payer: Self-pay | Admitting: *Deleted

## 2013-06-01 ENCOUNTER — Telehealth (HOSPITAL_COMMUNITY): Payer: Self-pay | Admitting: *Deleted

## 2013-06-04 ENCOUNTER — Ambulatory Visit (INDEPENDENT_AMBULATORY_CARE_PROVIDER_SITE_OTHER): Payer: Medicare Other | Admitting: Cardiovascular Disease

## 2013-06-04 ENCOUNTER — Encounter: Payer: Self-pay | Admitting: Cardiovascular Disease

## 2013-06-04 VITALS — BP 110/72 | HR 68 | Ht 68.0 in | Wt 128.0 lb

## 2013-06-04 DIAGNOSIS — Z79899 Other long term (current) drug therapy: Secondary | ICD-10-CM

## 2013-06-04 DIAGNOSIS — Z01818 Encounter for other preprocedural examination: Secondary | ICD-10-CM

## 2013-06-04 DIAGNOSIS — I739 Peripheral vascular disease, unspecified: Secondary | ICD-10-CM

## 2013-06-04 DIAGNOSIS — D689 Coagulation defect, unspecified: Secondary | ICD-10-CM

## 2013-06-04 LAB — BASIC METABOLIC PANEL
BUN: 14 mg/dL (ref 6–23)
CO2: 29 mEq/L (ref 19–32)
Calcium: 9.2 mg/dL (ref 8.4–10.5)
Chloride: 101 mEq/L (ref 96–112)
Creat: 0.79 mg/dL (ref 0.50–1.35)
Glucose, Bld: 106 mg/dL — ABNORMAL HIGH (ref 70–99)
Sodium: 138 mEq/L (ref 135–145)

## 2013-06-04 LAB — CBC
HCT: 41.1 % (ref 39.0–52.0)
Hemoglobin: 14.4 g/dL (ref 13.0–17.0)
MCH: 32.1 pg (ref 26.0–34.0)
MCHC: 35 g/dL (ref 30.0–36.0)
Platelets: 236 10*3/uL (ref 150–400)
RDW: 14 % (ref 11.5–15.5)
WBC: 10.4 10*3/uL (ref 4.0–10.5)

## 2013-06-04 NOTE — Assessment & Plan Note (Signed)
Mr. Courington had critical limb ischemia with nonhealing ulcers on both feet. I performed percutaneous revascularization of his right SFA chronic total occlusion on 05/08/13 with overlapping IDEV stents. His claudication resolved and his wounds healed. He has an ischemic ulcer on his left toe which is painful and a chronic total occlusion in the mid to distal left SFA above-knee popliteal. We'll proceed with angiography and intervention for critical limb ischemia on the side.

## 2013-06-04 NOTE — Progress Notes (Signed)
   06/04/2013 Christopher Pena   09/27/1945  [id number]  Primary Physician No PCP Per Patient Primary Cardiologist: Spence Soberano J. Darcie Mellone MD FACP,FACC,FAHA, FSCAI   HPI:  Christopher Pena is a 67-year-old thin and cachectic appearing divorced Caucasian male father of 2, grandfather 22 grandchildren is a retired golf professional. He is referred by Dr. Hilty for cardiovascular evaluation. Factors include 50-100-pack-year history of tobacco use having smoked one pack a day recently and tried to stop the cigarettes. He does have hypertension. He's never had a heart attack or stroke. He does of COPD. He complained of several months of  lifestyle limiting claudication which has gotten worse now unable to walk more than 20 yards. He does have resting pain at night. Dopplers performed in office on 04/16/13 revealed a right ABI 0.23 and left appropriate suggesting critical limb ischemia. A subsequent Myoview stress test was low risk. On 05/10/13 a performed r angiography demonstrating occluded SFAs bilaterally. I recanalize his right SFA which resulted in improvement in his right lower [name] location and healing of his wound. He continues to have left lower extremity claudication and nonhealing wound. Will plan to proceed with percutaneous transposition of his left SFA. He does continue to smoke and is recalcitrant to risk factor modification.    Current Outpatient Prescriptions  Medication Sig Dispense Refill  . ALPRAZolam (XANAX) 1 MG tablet Take 1 mg by mouth 3 (three) times daily.      . aspirin EC 81 MG tablet Take 81 mg by mouth See admin instructions. Patient takes 1 tablet every morning and occasionally takes one in the evening as needed for pain.      . clopidogrel (PLAVIX) 75 MG tablet Take 1 tablet (75 mg total) by mouth daily.  30 tablet  5  . ibuprofen (ADVIL,MOTRIN) 800 MG tablet Take 800 mg by mouth every 8 (eight) hours as needed for pain.      . Oxycodone HCl 10 MG TABS Take 10 mg by mouth every 4  (four) hours.       No current facility-administered medications for this visit.    No Known Allergies  History   Social History  . Marital Status: Divorced    Spouse Name: N/A    Number of Children: N/A  . Years of Education: N/A   Occupational History  . former golf pro    Social History Main Topics  . Smoking status: Current Every Day Smoker -- 1.00 packs/day for 50 years    Types: Cigarettes  . Smokeless tobacco: Never Used  . Alcohol Use: No  . Drug Use: No  . Sexual Activity: Not on file   Other Topics Concern  . Not on file   Social History Narrative  . No narrative on file     Review of Systems: General: negative for chills, fever, night sweats or weight changes.  Cardiovascular: negative for chest pain, dyspnea on exertion, edema, orthopnea, palpitations, paroxysmal nocturnal dyspnea or shortness of breath Dermatological: negative for rash Respiratory: negative for cough or wheezing Urologic: negative for hematuria Abdominal: negative for nausea, vomiting, diarrhea, bright red blood per rectum, melena, or hematemesis Neurologic: negative for visual changes, syncope, or dizziness All other systems reviewed and are otherwise negative except as noted above.    Blood pressure 110/72, pulse 68, height 5' 8" (1.727 m), weight 128 lb (58.06 kg).  General appearance: alert and no distress Neck: no adenopathy, no carotid bruit, no JVD, supple, symmetrical, trachea midline and thyroid not enlarged, symmetric,   no tenderness/mass/nodules Lungs: clear to auscultation bilaterally Heart: regular rate and rhythm, S1, S2 normal, no murmur, click, rub or gallop Extremities: extremities normal, atraumatic, no cyanosis or edema and has a 2+ right pedal pulse.  EKG not performed today  ASSESSMENT AND PLAN:   PAD (peripheral artery disease) Christopher Pena had critical limb ischemia with nonhealing ulcers on both feet. I performed percutaneous revascularization of his right SFA  chronic total occlusion on 05/08/13 with overlapping IDEV stents. His claudication resolved and his wounds healed. He has an ischemic ulcer on his left toe which is painful and a chronic total occlusion in the mid to distal left SFA above-knee popliteal. We'll proceed with angiography and intervention for critical limb ischemia on the side.      Kayleena Eke J. Jenayah Antu MD FACP,FACC,FAHA, FSCAI 06/04/2013 3:19 PM  

## 2013-06-04 NOTE — Patient Instructions (Signed)
Dr. Allyson Sabal has ordered a peripheral angiogram to be done at [facility]  This procedure is going to look at the bloodflow in your lower extremities.  If Dr. Allyson Sabal is able to open up the arteries, you will have to spend one night in the hospital.  If he is not able to open the arteries, you will be able to go home that same day.    After the procedure, you will not be allowed to drive for 3 days or push, pull, or lift anything greater than 10 lbs for one week.    You will be required to have bloodwork prior to your procedure.  Our scheduler will advise you on when these items need to be done.    You do not need another chest xray!  REPS: Lorin Picket and Delice Bison

## 2013-06-05 LAB — PROTIME-INR
INR: 1.05 (ref <1.50)
Prothrombin Time: 13.7 seconds (ref 11.6–15.2)

## 2013-06-05 LAB — APTT: aPTT: 43 seconds — ABNORMAL HIGH (ref 24–37)

## 2013-06-07 ENCOUNTER — Encounter: Payer: Self-pay | Admitting: *Deleted

## 2013-06-07 ENCOUNTER — Encounter (HOSPITAL_COMMUNITY): Payer: Self-pay | Admitting: Pharmacy Technician

## 2013-06-08 ENCOUNTER — Encounter (HOSPITAL_COMMUNITY)
Admission: RE | Disposition: A | Discharge status: Home / Self Care | Payer: Self-pay | Source: Ambulatory Visit | Attending: Cardiovascular Disease

## 2013-06-08 ENCOUNTER — Encounter (HOSPITAL_COMMUNITY): Payer: Self-pay | Admitting: General Practice

## 2013-06-08 ENCOUNTER — Ambulatory Visit (HOSPITAL_COMMUNITY)
Admission: RE | Admit: 2013-06-08 | Discharge: 2013-06-09 | Disposition: A | Discharge status: Home / Self Care | Payer: Medicare Other | Source: Ambulatory Visit | Attending: Cardiovascular Disease | Admitting: Cardiovascular Disease

## 2013-06-08 DIAGNOSIS — I10 Essential (primary) hypertension: Secondary | ICD-10-CM | POA: Insufficient documentation

## 2013-06-08 DIAGNOSIS — R64 Cachexia: Secondary | ICD-10-CM | POA: Insufficient documentation

## 2013-06-08 DIAGNOSIS — L97509 Non-pressure chronic ulcer of other part of unspecified foot with unspecified severity: Secondary | ICD-10-CM | POA: Insufficient documentation

## 2013-06-08 DIAGNOSIS — Z79899 Other long term (current) drug therapy: Secondary | ICD-10-CM | POA: Insufficient documentation

## 2013-06-08 DIAGNOSIS — L03116 Cellulitis of left lower limb: Secondary | ICD-10-CM | POA: Diagnosis present

## 2013-06-08 DIAGNOSIS — I739 Peripheral vascular disease, unspecified: Secondary | ICD-10-CM | POA: Insufficient documentation

## 2013-06-08 DIAGNOSIS — Z01818 Encounter for other preprocedural examination: Secondary | ICD-10-CM

## 2013-06-08 DIAGNOSIS — J449 Chronic obstructive pulmonary disease, unspecified: Secondary | ICD-10-CM | POA: Diagnosis present

## 2013-06-08 DIAGNOSIS — L98499 Non-pressure chronic ulcer of skin of other sites with unspecified severity: Secondary | ICD-10-CM | POA: Insufficient documentation

## 2013-06-08 DIAGNOSIS — F17210 Nicotine dependence, cigarettes, uncomplicated: Secondary | ICD-10-CM | POA: Diagnosis present

## 2013-06-08 DIAGNOSIS — I7092 Chronic total occlusion of artery of the extremities: Secondary | ICD-10-CM | POA: Insufficient documentation

## 2013-06-08 DIAGNOSIS — F17200 Nicotine dependence, unspecified, uncomplicated: Secondary | ICD-10-CM | POA: Insufficient documentation

## 2013-06-08 DIAGNOSIS — L02619 Cutaneous abscess of unspecified foot: Secondary | ICD-10-CM | POA: Insufficient documentation

## 2013-06-08 DIAGNOSIS — J4489 Other specified chronic obstructive pulmonary disease: Secondary | ICD-10-CM | POA: Insufficient documentation

## 2013-06-08 DIAGNOSIS — S81809A Unspecified open wound, unspecified lower leg, initial encounter: Secondary | ICD-10-CM

## 2013-06-08 HISTORY — PX: LOWER EXTREMITY ANGIOGRAM: SHX5508

## 2013-06-08 HISTORY — DX: Tobacco use: Z72.0

## 2013-06-08 HISTORY — DX: Unspecified open wound, unspecified lower leg, initial encounter: S81.809A

## 2013-06-08 HISTORY — PX: FEMORAL ARTERY STENT: SHX1583

## 2013-06-08 HISTORY — DX: Peripheral vascular disease, unspecified: I73.9

## 2013-06-08 LAB — POCT ACTIVATED CLOTTING TIME
Activated Clotting Time: 222 seconds
Activated Clotting Time: 232 seconds
Activated Clotting Time: 186 seconds

## 2013-06-08 SURGERY — ANGIOGRAM, LOWER EXTREMITY
Anesthesia: LOCAL

## 2013-06-08 MED ORDER — OXYCODONE HCL 5 MG PO TABS
10.0000 mg | ORAL_TABLET | ORAL | Status: DC
Start: 2013-06-08 — End: 2013-06-09
  Administered 2013-06-08 – 2013-06-09 (×7): 10 mg via ORAL
  Filled 2013-06-08 (×12): qty 2

## 2013-06-08 MED ORDER — OXYCODONE HCL 5 MG PO TABS
ORAL_TABLET | ORAL | Status: AC
  Filled 2013-06-08: qty 2

## 2013-06-08 MED ORDER — CLOPIDOGREL BISULFATE 75 MG PO TABS
75.0000 mg | ORAL_TABLET | Freq: Every day | ORAL | Status: DC
  Administered 2013-06-08: 18:00:00 75 mg via ORAL
  Filled 2013-06-08 (×3): qty 1

## 2013-06-08 MED ORDER — HEPARIN (PORCINE) IN NACL 2-0.9 UNIT/ML-% IJ SOLN
INTRAMUSCULAR | Status: AC
  Filled 2013-06-08: qty 500

## 2013-06-08 MED ORDER — ASPIRIN EC 81 MG PO TBEC: 81.0000 mg | DELAYED_RELEASE_TABLET | Freq: Every day | ORAL | Status: DC

## 2013-06-08 MED ORDER — NICOTINE 7 MG/24HR TD PT24
7.0000 mg | MEDICATED_PATCH | TRANSDERMAL | Status: DC
  Administered 2013-06-08 – 2013-06-09 (×2): 7 mg via TRANSDERMAL
  Filled 2013-06-08 (×2): qty 1

## 2013-06-08 MED ORDER — ACETAMINOPHEN 325 MG PO TABS: 650.0000 mg | ORAL_TABLET | ORAL | Status: DC | PRN

## 2013-06-08 MED ORDER — SODIUM CHLORIDE 0.9 % IV SOLN: INTRAVENOUS | Status: AC

## 2013-06-08 MED ORDER — SODIUM CHLORIDE 0.9 % IJ SOLN: 3.0000 mL | INTRAMUSCULAR | Status: DC | PRN

## 2013-06-08 MED ORDER — HYDRALAZINE HCL 20 MG/ML IJ SOLN: 10.0000 mg | INTRAMUSCULAR | Status: DC

## 2013-06-08 MED ORDER — SODIUM CHLORIDE 0.9 % IV SOLN
INTRAVENOUS | Status: DC
  Administered 2013-06-08: 08:00:00 via INTRAVENOUS

## 2013-06-08 MED ORDER — HEPARIN SODIUM (PORCINE) 1000 UNIT/ML IJ SOLN
INTRAMUSCULAR | Status: AC
  Filled 2013-06-08: qty 1

## 2013-06-08 MED ORDER — DIAZEPAM 5 MG PO TABS
5.0000 mg | ORAL_TABLET | ORAL | Status: AC
  Administered 2013-06-08: 5 mg via ORAL
  Filled 2013-06-08: qty 1

## 2013-06-08 MED ORDER — FENTANYL CITRATE 0.05 MG/ML IJ SOLN
INTRAMUSCULAR | Status: AC
  Filled 2013-06-08: qty 2

## 2013-06-08 MED ORDER — ASPIRIN 81 MG PO CHEW
81.0000 mg | CHEWABLE_TABLET | ORAL | Status: AC
  Administered 2013-06-08: 81 mg via ORAL

## 2013-06-08 MED ORDER — ALPRAZOLAM 0.5 MG PO TABS
1.0000 mg | ORAL_TABLET | Freq: Three times a day (TID) | ORAL | Status: DC
  Administered 2013-06-09: 11:00:00 1 mg via ORAL
  Filled 2013-06-08: qty 2

## 2013-06-08 MED ORDER — ASPIRIN EC 325 MG PO TBEC
325.0000 mg | DELAYED_RELEASE_TABLET | Freq: Every day | ORAL | Status: DC
  Administered 2013-06-09: 11:00:00 325 mg via ORAL
  Filled 2013-06-08: qty 1

## 2013-06-08 MED ORDER — ONDANSETRON HCL 4 MG/2ML IJ SOLN: 4.0000 mg | Freq: Four times a day (QID) | INTRAMUSCULAR | Status: DC | PRN

## 2013-06-08 MED ORDER — CLOPIDOGREL BISULFATE 75 MG PO TABS: 75.0000 mg | ORAL_TABLET | Freq: Every day | ORAL | Status: DC

## 2013-06-08 MED ORDER — HEPARIN (PORCINE) IN NACL 2-0.9 UNIT/ML-% IJ SOLN
INTRAMUSCULAR | Status: AC
  Filled 2013-06-08: qty 1000

## 2013-06-08 MED ORDER — ASPIRIN 81 MG PO CHEW
CHEWABLE_TABLET | ORAL | Status: AC
  Administered 2013-06-08: 81 mg via ORAL
  Filled 2013-06-08: qty 1

## 2013-06-08 MED ORDER — HYDRALAZINE HCL 20 MG/ML IJ SOLN: 10.0000 mg | INTRAMUSCULAR | Status: DC | PRN

## 2013-06-08 MED ORDER — LIDOCAINE HCL (PF) 1 % IJ SOLN
INTRAMUSCULAR | Status: AC
  Filled 2013-06-08: qty 30

## 2013-06-08 NOTE — H&P (View-Only) (Signed)
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 He is referred by Dr. Rennis Golden for cardiovascular evaluation. Factors include 50-100-pack-year history of tobacco use having smoked one pack a day recently and tried to stop the cigarettes. He does have hypertension. He's never had a heart attack or stroke. He does of COPD. He complained of several months of  lifestyle limiting claudication which has gotten worse now unable to walk more than 20 yards. He does have resting pain at night. Dopplers performed in office on 04/16/13 revealed a right ABI 0.23 and left appropriate suggesting critical limb ischemia. A subsequent Myoview stress test was low risk. On 05/10/13 a performed r angiography demonstrating occluded SFAs bilaterally. I recanalize his right SFA which resulted in improvement in his right lower [name] location and healing of his wound. He continues to have left lower extremity claudication and nonhealing wound. Will plan to proceed with percutaneous transposition of his left SFA. He does continue to smoke and is recalcitrant to risk factor modification.    Current Outpatient Prescriptions  Medication Sig Dispense Refill  . ALPRAZolam (XANAX) 1 MG tablet Take 1 mg by mouth 3 (three) times daily.      Marland Kitchen aspirin EC 81 MG tablet Take 81 mg by mouth See admin instructions. Patient takes 1 tablet every morning and occasionally takes one in the evening as needed for pain.      Marland Kitchen clopidogrel (PLAVIX) 75 MG tablet Take 1 tablet (75 mg total) by mouth daily.  30 tablet  5  . ibuprofen (ADVIL,MOTRIN) 800 MG tablet Take 800 mg by mouth every 8 (eight) hours as needed for pain.      . Oxycodone HCl 10 MG TABS Take 10 mg by mouth every 4  (four) hours.       No current facility-administered medications for this visit.    No Known Allergies  History   Social History  . Marital Status: Divorced    Spouse Name: N/A    Number of Children: N/A  . Years of Education: N/A   Occupational History  . former golf pro    Social History Main Topics  . Smoking status: Current Every Day Smoker -- 1.00 packs/day for 50 years    Types: Cigarettes  . Smokeless tobacco: Never Used  . Alcohol Use: No  . Drug Use: No  . Sexual Activity: Not on file   Other Topics Concern  . Not on file   Social History Narrative  . No narrative on file     Review of Systems: General: negative for chills, fever, night sweats or weight changes.  Cardiovascular: negative for chest pain, dyspnea on exertion, edema, orthopnea, palpitations, paroxysmal nocturnal dyspnea or shortness of breath Dermatological: negative for rash Respiratory: negative for cough or wheezing Urologic: negative for hematuria Abdominal: negative for nausea, vomiting, diarrhea, bright red blood per rectum, melena, or hematemesis Neurologic: negative for visual changes, syncope, or dizziness All other systems reviewed and are otherwise negative except as noted above.    Blood pressure 110/72, pulse 68, height 5\' 8"  (1.727 m), weight 128 lb (58.06 kg).  General appearance: alert and no distress Neck: no adenopathy, no carotid bruit, no JVD, supple, symmetrical, trachea midline and thyroid not enlarged, symmetric,  no tenderness/mass/nodules Lungs: clear to auscultation bilaterally Heart: regular rate and rhythm, S1, S2 normal, no murmur, click, rub or gallop Extremities: extremities normal, atraumatic, no cyanosis or edema and has a 2+ right pedal pulse.  EKG not performed today  ASSESSMENT AND PLAN:   PAD (peripheral artery disease) Christopher Pena had critical limb ischemia with nonhealing ulcers on both feet. I performed percutaneous revascularization of his right SFA  chronic total occlusion on 05/08/13 with overlapping IDEV stents. His claudication resolved and his wounds healed. He has an ischemic ulcer on his left toe which is painful and a chronic total occlusion in the mid to distal left SFA above-knee popliteal. We'll proceed with angiography and intervention for critical limb ischemia on the side.      Runell Gess MD FACP,FACC,FAHA, [facility] 06/04/2013 3:19 PM

## 2013-06-08 NOTE — Plan of Care (Signed)
Problem: Consults Goal: Tobacco Cessation referral if indicated Outcome: Completed/Met Date Met:  06/08/13 Discussed smoking cessation with pt.  He states he smokes >1 ppd and has considered using e-cigarette.  Encouraged complete cessation with nicotine patch, explained dangers of using nicotine product with patch including stroke, heart attack, and death.  Pt states not committed to quitting at this time.  Tips for success sheet with support number given.

## 2013-06-08 NOTE — CV Procedure (Signed)
Christopher Pena is a 67 y.o. male    [id number] LOCATION:  FACILITY: [facility]  PHYSICIAN: Christopher Pena, M.D. 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 He is referred by Dr. Rennis Pena for cardiovascular evaluation. Factors include 50-100-pack-year history of tobacco use having smoked one pack a day recently and tried to stop the cigarettes. He does have hypertension. He's never had a heart attack or stroke. He does of COPD. He complained of several months of lifestyle limiting claudication which has gotten worse now unable to walk more than 20 yards. He does have resting pain at night. Dopplers performed in office on 04/16/13 revealed a right ABI 0.23 and left appropriate suggesting critical limb ischemia. A subsequent Myoview stress test was low risk. On 05/10/13 a performed r angiography demonstrating occluded SFAs bilaterally. I recanalize his right SFA which resulted in improvement in his right lower [name] location and healing of his wound. He continues to have left lower extremity claudication and nonhealing wound. Will plan to proceed with percutaneous transposition of his left SFA. He does continue to smoke and is recalcitrant to risk factor modification.    PROCEDURE DESCRIPTION:   The patient was brought to the second floor [facility] cath lab in the postabsorptive state. He was premedicated with Valium 5 mg by mouth, IV Versed and fentanyl. His right groinwas prepped and shaved in usual sterile fashion. Xylocaine 1% was used for local anesthesia. A 7 French sheath was inserted into the right common femoral  artery using standard Seldinger technique. Contralateral access was obtained with a 5 [place] crossover catheter, 035 Versicore  wire and a  7 French 55 cm long Ansel Sheath. A total of 240 cc of contrast was administered to the patient. The patient received a dose units of heparin intravenously ending ACT of 232.   HEMODYNAMICS:    AO SYSTOLIC/AO DIASTOLIC: 140/78   Angiographic Data: angiography revealed a total SFA on the left side of the canal reconstitution in the P3 segment of the popliteal artery. The posterior tibial was occluded.  IMPRESSION:long segment chronic total occlusion left SFA and popliteal artery with critical limb ischemia. We'll proceed with attempt at percutaneous transposition using the Viance catheter, chocolate balloon plus or minus the IDEV   Procedure Description:the sheath was placed in the left external iliac artery. Therapeutic ACT was documented. In 01/300 cm length [place] core wire was then preloaded into the [employer] catheter. With great difficulty the CTO was  crossed and true intraluminal position was documented angiographically. The crusting catheter was then removed and angioplasty was performed with a 3 mm x 120 mm long balloon creating a "channel". Following this a 5 mm x 120 mm long chocolate balloon was then used to perform angioplasty andthroughout the entirety of the chronic total occlusion. Angiography was performed and showed a area of dissection in the P2 segment. Following this, the entire segment was dilated with a 6 mm x 150 mm long balloon at nominal pressures and stented with a 5.5 mm x 150 mm long IDEV  Stent. The area within the abductor canal had significant recoil and was postdilated with a 5 mm x 100 mm balloon at 12 atmospheres. The final answer result with reduction of a long segment of total occlusion of  the mid and distal left SFA and P1 P3 segments 20% residual with excellent flow. There was a small area of recoil within the adductor canal and a 30-40% range. The anterior tibial and. Arteries were widely patent and the posterior tibial felt by collaterals.  Final  Impression: successful recanalization of a long chronic total occlusion SFA and popliteal arteries on the left lower extremity for critical limb ischemia using the IDEV  stent and chocolate balloon crossing using the Viance causing catheter. The patient is 40 on aspirin Plavix. The sheath was withdrawn across the bifurcation at completion angiography was performed using a pigtail catheter, bolus chase digital subtraction the table technique. The long 7 French sheath was then exchanged for a short 7 [place] sheath. The patient left the lab in stable condition.    Christopher Gess MD, [facility] 06/08/2013 11:09 AM

## 2013-06-08 NOTE — Interval H&P Note (Signed)
History and Physical Interval Note:  06/08/2013 9:01 AM  Christopher Pena  has presented today for surgery, with the diagnosis of Claudication  The various methods of treatment have been discussed with the patient and family. After consideration of risks, benefits and other options for treatment, the patient has consented to  Procedure(s): LOWER EXTREMITY ANGIOGRAM (N/A) as a surgical intervention .  The patient's history has been reviewed, patient examined, no change in status, stable for surgery.  I have reviewed the patient's chart and labs.  Questions were answered to the patient's satisfaction.     Runell Gess

## 2013-06-09 ENCOUNTER — Encounter (HOSPITAL_COMMUNITY): Payer: Self-pay | Admitting: Cardiology

## 2013-06-09 ENCOUNTER — Other Ambulatory Visit: Payer: Self-pay | Admitting: Cardiology

## 2013-06-09 DIAGNOSIS — S81809A Unspecified open wound, unspecified lower leg, initial encounter: Secondary | ICD-10-CM

## 2013-06-09 DIAGNOSIS — F17210 Nicotine dependence, cigarettes, uncomplicated: Secondary | ICD-10-CM | POA: Diagnosis present

## 2013-06-09 DIAGNOSIS — S91109D Unspecified open wound of unspecified toe(s) without damage to nail, subsequent encounter: Secondary | ICD-10-CM

## 2013-06-09 DIAGNOSIS — L03116 Cellulitis of left lower limb: Secondary | ICD-10-CM | POA: Diagnosis present

## 2013-06-09 DIAGNOSIS — Z959 Presence of cardiac and vascular implant and graft, unspecified: Secondary | ICD-10-CM

## 2013-06-09 DIAGNOSIS — I10 Essential (primary) hypertension: Secondary | ICD-10-CM

## 2013-06-09 DIAGNOSIS — I739 Peripheral vascular disease, unspecified: Secondary | ICD-10-CM

## 2013-06-09 DIAGNOSIS — Z720 Tobacco use: Secondary | ICD-10-CM

## 2013-06-09 HISTORY — DX: Unspecified open wound, unspecified lower leg, initial encounter: S81.809A

## 2013-06-09 HISTORY — DX: Tobacco use: Z72.0

## 2013-06-09 LAB — CBC
HCT: 36.4 % — ABNORMAL LOW (ref 39.0–52.0)
MCH: 32.7 pg (ref 26.0–34.0)
MCV: 92.2 fL (ref 78.0–100.0)
RBC: 3.95 MIL/uL — ABNORMAL LOW (ref 4.22–5.81)
WBC: 10 10*3/uL (ref 4.0–10.5)

## 2013-06-09 LAB — BASIC METABOLIC PANEL
CO2: 24 mEq/L (ref 19–32)
Chloride: 100 mEq/L (ref 96–112)
Glucose, Bld: 105 mg/dL — ABNORMAL HIGH (ref 70–99)
Potassium: 4 mEq/L (ref 3.5–5.1)
Sodium: 134 mEq/L — ABNORMAL LOW (ref 135–145)

## 2013-06-09 MED ORDER — ASPIRIN 325 MG PO TBEC: 325.0000 mg | DELAYED_RELEASE_TABLET | Freq: Every day | ORAL | Status: DC

## 2013-06-09 MED ORDER — CEPHALEXIN 500 MG PO CAPS: 500.0000 mg | ORAL_CAPSULE | Freq: Three times a day (TID) | ORAL | Status: DC

## 2013-06-09 MED ORDER — CEPHALEXIN 500 MG PO CAPS
500.0000 mg | ORAL_CAPSULE | Freq: Three times a day (TID) | ORAL | Status: DC
  Filled 2013-06-09 (×3): qty 1

## 2013-06-09 NOTE — Progress Notes (Signed)
67 year old WM  Factors include 50-100-pack-year history of tobacco use having smoked one pack a day recently and tried to stop the cigarettes. He does have hypertension. He's never had a heart attack or stroke. He does of COPD. He complained of several months of lifestyle limiting claudication which has gotten worse now unable to walk more than 20 yards. He does have resting pain at night. Dopplers performed in office on 04/16/13 revealed a right ABI 0.23 and left appropriate suggesting critical limb ischemia. A subsequent Myoview stress test was low risk. On 05/10/13 a performed r angiography demonstrating occluded SFAs bilaterally. I recanalize his right SFA which resulted in improvement in his right lower [name] location and healing of his wound. He continues to have left lower extremity claudication and nonhealing wound. Will plan to proceed with percutaneous transposition of his left SFA. He does continue to smoke and is recalcitrant to risk factor modification.   Subjective: Lt foot initially post procedure with reduced edema, though this AM + edema, and redness. Did hold foot off bed last pm due to throbbing this has been prior to procedure as well.   Objective: Vital signs in last 24 hours: Temp:  [97.8 F (36.6 C)-100.2 F (37.9 C)] 98.2 F (36.8 C) (10/22 0700) Pulse Rate:  [70-93] 93 (10/22 0700) Resp:  [16-20] 20 (10/22 0700) BP: (105-142)/(65-97) 114/65 mmHg (10/22 0700) SpO2:  [97 %-100 %] 97 % (10/22 0700) Weight:  [128 lb 1.4 oz (58.1 kg)] 128 lb 1.4 oz (58.1 kg) (10/22 0300) Weight change:  Last BM Date: 06/07/13 Intake/Output from previous day: 10/21 0701 - 10/22 0700 In: 1740 [P.O.:480; I.V.:1260] Out: 1000 [Urine:1000] Intake/Output this shift: Total I/O In: 120 [P.O.:120] Out: 200 [Urine:200]  ZO:XWRUEAV:WUJWJXBJ affect, NAD Skin:Warm and dry, brisk capillary refill HEENT:normocephalic, sclera clear, mucus membranes moist Heart:S1S2 RRR without murmur,  gallup, rub or click Lungs:clear without rales, rhonchi, or wheezes YNW:GNFA, non tender, + BS, do not palpate liver spleen or masses Ext:Lt foot edema, and redness, non healing wound on Lt great toe. Neuro:alert and oriented, MAE, follows commands, + facial symmetry  Lab Results:  Recent Labs  06/09/13 0333  WBC 10.0  HGB 12.9*  HCT 36.4*  PLT 189   BMET  Recent Labs  06/09/13 0333  NA 134*  K 4.0  CL 100  CO2 24  GLUCOSE 105*  BUN 13  CREATININE 0.72  CALCIUM 8.3*   No results found for this basename: TROPONINI, CK, MB,  in the last 72 hours  No results found for this basename: CHOL,  HDL,  LDLCALC,  LDLDIRECT,  TRIG,  CHOLHDL   No results found for this basename: HGBA1C     Lab Results  Component Value Date   TSH 1.027 05/04/2013    Studies/Results: successful recanalization of a long chronic total occlusion SFA and popliteal arteries on the left lower extremity for critical limb ischemia using the IDEV stent and chocolate balloon crossing using the Viance causing catheter  Medications: I have reviewed the patient's current medications. Scheduled Meds: . ALPRAZolam  1 mg Oral TID  . aspirin EC  325 mg Oral Daily  . clopidogrel  75 mg Oral Q breakfast  . nicotine  7 mg Transdermal Q24H  . oxyCODONE  10 mg Oral Q4H   Continuous Infusions:  PRN Meds:.acetaminophen, hydrALAZINE, ondansetron (ZOFRAN) IV  Assessment/Plan: Principal Problem:   Claudication, lifestyle limiting Active Problems:   PAD (peripheral artery disease), PTA/stent IDEV & chocolate  baloon 06/08/13, Previous PTA/Stent to Rt SFA 05/10/13   COPD (chronic obstructive pulmonary disease)  PLAN:   Ambulate and d/c home.  Follow up outpt dopplers, bil for yesterday procedure and procedure done 05/10/13 PTA/stent  Rt SFA.  Lt foot swelling and with redness.  Temp up to 100.2 last pm.  Add Keflex until he sees podiatrist?  Pt stated wound on Rt foot cleared within 2 weeks of PCI.     LOS: 1 day     [facility] R  Nurse Practitioner Certified Pager [phone] 06/09/2013, 10:01 AM   Pt seen & examined.  Groin site stable, good LLE pulses - I agree that the foot does seem swollen with erythema.  Short course of Abx would be a good idea.   Has f/u plan arranged.   Otherwise stable post LSFA PTA / stet.  Needs smoking cessation.  Needs to ROV with Dr. Allyson Sabal as well as Podiatry.  Ready for d/c  Marykay Lex, M.D., M.S. [facility] [address] [place], [place]  [address]  [phone] Pager # [phone] 06/09/2013 12:47 PM

## 2013-06-09 NOTE — Discharge Summary (Signed)
Physician Discharge Summary       Patient ID: Christopher Pena MRN: [id number] DOB/AGE: [id number] y.o.  Admit date: 06/08/2013 Discharge date: 06/09/2013  Discharge Diagnoses:  Principal Problem:   Claudication, lifestyle limiting Active Problems:   PAD (peripheral artery disease), PTA/stent IDEV & chocolate baloon 06/08/13, Previous PTA/Stent to Rt SFA 05/10/13   COPD (chronic obstructive pulmonary disease)   Cellulitis of foot, left   Non-healing wound of lower extremity, lt great toe   Tobacco use   Discharged Condition: good  Procedures: 2nd of planned staged intervention to Lt SFA with successful recanalization of a long chronic total occlusion SFA and popliteal arteries on the left lower extremity for critical limb ischemia using the IDEV stent and chocolate balloon crossing using the Viance causing catheter by Dr. Allyson Sabal 06/08/13. (Previous recanalization of Rt SFA was don 04/2013.)   Hospital Course: 67 year old WM  With risk factors that include 50-100-pack-year history of tobacco use having smoked one pack a day recently and tried to stop the cigarettes. He does have hypertension. He's never had a heart attack or stroke. He does of COPD. He complained of several months of lifestyle limiting claudication which has gotten worse now unable to walk more than 20 yards. He does have resting pain at night. Dopplers performed in office on 04/16/13 revealed a right ABI 0.23 and left appropriate suggesting critical limb ischemia. A subsequent Myoview stress test was low risk. On 05/10/13 Dr. Allyson Sabal performed angiography demonstrating occluded SFAs bilaterally. Dr. Allyson Sabal recanalize his right SFA which resulted in improvement in his right lower [name] location and healing of his wound. He continues to have left lower extremity claudication and nonhealing wound of Lt great toe.   Elective procedure of his left SFA was planned and pt underwent successful recanalization of a long chronic total  occlusion SFA and popliteal arteries on the left lower extremity for critical limb ischemia using the IDEV stent and chocolate balloon crossing using the Viance causing catheter.  He does continue to smoke and is recalcitrant to risk factor modification.   He tolerated the procedure without complications, but did develop mild fever night after procedure.  His Lt foot has less pain and edema, but is erythremic.  Keflex 500 mg added every 8 hours for 10 days and he will follow up with his podiatrist for further management of his Lt great toe wound. He will have bil. Lower ext dopplers done and then follow up with Dr. Allyson Sabal.  He will need continued tobacco cessation instructions as well. He was seen and found stable by Dr. Herbie Baltimore for discharge.  Consults: None  Significant Diagnostic Studies:  BMET    Component Value Date/Time   NA 134* 06/09/2013 0333   K 4.0 06/09/2013 0333   CL 100 06/09/2013 0333   CO2 24 06/09/2013 0333   GLUCOSE 105* 06/09/2013 0333   BUN 13 06/09/2013 0333   CREATININE 0.72 06/09/2013 0333   CREATININE 0.79 06/04/2013 1555   CALCIUM 8.3* 06/09/2013 0333   GFRNONAA >90 06/09/2013 0333   GFRAA >90 06/09/2013 0333    CBC    Component Value Date/Time   WBC 10.0 06/09/2013 0333   RBC 3.95* 06/09/2013 0333   HGB 12.9* 06/09/2013 0333   HCT 36.4* 06/09/2013 0333   PLT 189 06/09/2013 0333   MCV 92.2 06/09/2013 0333   MCH 32.7 06/09/2013 0333   MCHC 35.4 06/09/2013 0333   RDW 13.1 06/09/2013 0333       Discharge Exam:  Blood pressure 114/65, pulse 93, temperature 98.2 F (36.8 C), temperature source Oral, resp. rate 20, height 5\' 8"  (1.727 m), weight 128 lb 1.4 oz (58.1 kg), SpO2 97.00%.  AM exam:  ZO:XWRUEAV:WUJWJXBJ affect, NAD  Skin:Warm and dry, brisk capillary refill  HEENT:normocephalic, sclera clear, mucus membranes moist  Heart:S1S2 RRR without murmur, gallup, rub or click  Lungs:clear without rales, rhonchi, or wheezes  YNW:GNFA, non tender, +  BS, do not palpate liver spleen or masses  Ext:Lt foot edema, and redness, non healing wound on Lt great toe.  Neuro:alert and oriented, MAE, follows commands, + facial symmetry  Disposition: 01-Home or Self Care     Medication List         ALPRAZolam 1 MG tablet  Commonly known as:  XANAX  Take 1 mg by mouth 3 (three) times daily.     aspirin 325 MG EC tablet  Take 1 tablet (325 mg total) by mouth daily.     cephALEXin 500 MG capsule  Commonly known as:  KEFLEX  Take 1 capsule (500 mg total) by mouth 3 (three) times daily.     clopidogrel 75 MG tablet  Commonly known as:  PLAVIX  Take 1 tablet (75 mg total) by mouth daily.     nicotine 7 mg/24hr patch  Commonly known as:  NICODERM CQ - dosed in mg/24 hr  Place 1 patch onto the skin daily.     Oxycodone HCl 10 MG Tabs  Take 10 mg by mouth every 4 (four) hours.       Follow-up Information   Follow up with Runell Gess, MD. (for follow up dopplers and then appt with Dr. Allyson Sabal our office will call)    Specialty:  Cardiology   Contact information:   [address] [place] [address] [phone]        Discharge Instructions:  Call [facility] if any bleeding, swelling or drainage at cath site.  May shower, no tub baths for 48 hours for groin sticks.   No lifting over 5 pounds for 3 days, no driving for 3 days if you drive.  Heart healthy diet.  See your Podiatrist for Lt great toe wound.   Signed: Leone Brand Nurse Practitioner-Certified [facility]: [facility] 06/09/2013, 5:57 PM  Time spent on discharge : 35 minutes.    I saw the patient on the day of discharge.  Ready for d/c.  I agree with Ms. Ingold's summary.  Marykay Lex, MD

## 2013-06-10 ENCOUNTER — Encounter (HOSPITAL_COMMUNITY): Payer: Self-pay | Admitting: *Deleted

## 2013-06-22 ENCOUNTER — Telehealth: Payer: Self-pay | Admitting: Cardiovascular Disease

## 2013-06-22 ENCOUNTER — Ambulatory Visit (INDEPENDENT_AMBULATORY_CARE_PROVIDER_SITE_OTHER): Payer: Medicare Other | Admitting: Cardiology

## 2013-06-22 ENCOUNTER — Encounter: Payer: Self-pay | Admitting: Cardiology

## 2013-06-22 VITALS — BP 110/60 | HR 88 | Ht 68.0 in | Wt 126.0 lb

## 2013-06-22 DIAGNOSIS — S91109D Unspecified open wound of unspecified toe(s) without damage to nail, subsequent encounter: Secondary | ICD-10-CM

## 2013-06-22 DIAGNOSIS — I739 Peripheral vascular disease, unspecified: Secondary | ICD-10-CM

## 2013-06-22 DIAGNOSIS — R609 Edema, unspecified: Secondary | ICD-10-CM

## 2013-06-22 DIAGNOSIS — F17200 Nicotine dependence, unspecified, uncomplicated: Secondary | ICD-10-CM

## 2013-06-22 DIAGNOSIS — S81802D Unspecified open wound, left lower leg, subsequent encounter: Secondary | ICD-10-CM

## 2013-06-22 DIAGNOSIS — F411 Generalized anxiety disorder: Secondary | ICD-10-CM

## 2013-06-22 DIAGNOSIS — R600 Localized edema: Secondary | ICD-10-CM

## 2013-06-22 DIAGNOSIS — F419 Anxiety disorder, unspecified: Secondary | ICD-10-CM

## 2013-06-22 DIAGNOSIS — Z720 Tobacco use: Secondary | ICD-10-CM

## 2013-06-22 DIAGNOSIS — Z5189 Encounter for other specified aftercare: Secondary | ICD-10-CM

## 2013-06-22 LAB — CBC
HCT: 41.8 % (ref 39.0–52.0)
Hemoglobin: 14.4 g/dL (ref 13.0–17.0)
MCHC: 34.4 g/dL (ref 30.0–36.0)
Platelets: 492 10*3/uL — ABNORMAL HIGH (ref 150–400)
RDW: 13.7 % (ref 11.5–15.5)
WBC: 12.5 10*3/uL — ABNORMAL HIGH (ref 4.0–10.5)

## 2013-06-22 LAB — BASIC METABOLIC PANEL
Calcium: 9.8 mg/dL (ref 8.4–10.5)
Glucose, Bld: 100 mg/dL — ABNORMAL HIGH (ref 70–99)
Potassium: 4.4 mEq/L (ref 3.5–5.3)
Sodium: 137 mEq/L (ref 135–145)

## 2013-06-22 MED ORDER — FUROSEMIDE 20 MG PO TABS: 20.0000 mg | ORAL_TABLET | Freq: Every day | ORAL | Status: DC

## 2013-06-22 MED ORDER — POTASSIUM CHLORIDE ER 10 MEQ PO TBCR: 10.0000 meq | EXTENDED_RELEASE_TABLET | Freq: Every day | ORAL | Status: DC

## 2013-06-22 NOTE — Telephone Encounter (Signed)
Please call-having problems-had angioplasty on 06-08-13.

## 2013-06-22 NOTE — Assessment & Plan Note (Addendum)
Minimal improvement since discharge.  Crusty wound.  We did have him on cipro post procedure due to low grade fevers. Once dopplers are done and pt has had follow up with Dr. Allyson Sabal pt should return to podiatrist for further wound management.

## 2013-06-22 NOTE — Assessment & Plan Note (Signed)
Continued swelling of Lt foot.  From just above his ankle through foot.  This has continued since discharge.  I explained that once arterial blood flow improves the venous system may have trouble keeping up.  I added low dose lasix and K+.  He is to have follow up art. Dopplers done on Friday. No pain in his foot.

## 2013-06-22 NOTE — Telephone Encounter (Signed)
Christopher Pena is calling to speak to someone .Marland Kitchen Having some issues .Marland Kitchen Please Call

## 2013-06-22 NOTE — Assessment & Plan Note (Signed)
Pt very worried about the swelling in his lt foot.  I have reassured him that he is getting good blood flow but dopplers will prove that.  Most likely all venous insuff. Causing edema.

## 2013-06-22 NOTE — Patient Instructions (Signed)
Take lasix daily with KDUR, this will help with your swelling.  Keep foot elevated when you are not up and about.  Will check Lab work to make sure potassium and white blood count stable.  Follow up with Dr. Allyson Sabal next week.  Stop smoking or at least keep cutting back.

## 2013-06-22 NOTE — Assessment & Plan Note (Signed)
Claudication has resolved.  No pain with walking.  Bil lower ext dopplers Friday and to follow up with Dr. Allyson Sabal next week.

## 2013-06-22 NOTE — Telephone Encounter (Signed)
Returned call and pt verified x 2.  Pt stated he had an angioplasty on L leg two weeks ago.  Stated swelling is worse in foot and ankle.  Also stated he was given Abx for wound on his toe.  Stated doppler scheduled for next Tuesday.  Wanted to know what he should do.  RN advised sooner appt for doppler and possible appt w/ Extender.  Notified scheduling and doppler rescheduled for Friday at 2pm.  Pt informed and that he is to be fasting after 7am.  Pt agreed.  Vernona Rieger, NP also notified and advised pt come in today for evaluation.  Pt informed and agreed w/ plan.  Appt scheduled for today at 2:20pm per pt request.  Will come sooner if he can.  Pt agreed w/ plan.

## 2013-06-22 NOTE — Progress Notes (Signed)
06/22/2013   PCP: No PCP Per Patient   Chief Complaint  Patient presents with  . Follow-up    s/p stent placement two weeks ago by Dr. Allyson Sabal sates swelling is not gooing away    Primary Cardiologist: Dr. Allyson Sabal  HPI:67 year old WM With risk factors that include 50-100-pack-year history of tobacco use having smoked one pack a day recently and tried to stop the cigarettes. He does have hypertension. He's never had a heart attack or stroke. He does of COPD. He complained of several months of lifestyle limiting claudication which has gotten worse now unable to walk more than 20 yards. He does have resting pain at night. Dopplers performed in office on 04/16/13 revealed a right ABI 0.23 and left appropriate suggesting critical limb ischemia. A subsequent Myoview stress test was low risk. On 05/10/13 Dr. Allyson Sabal performed angiography demonstrating occluded SFAs bilaterally. Dr. Allyson Sabal recanalize his right SFA which resulted in improvement in his right lower [name] location and healing of his wound. He continues to have left lower extremity claudication and nonhealing wound of Lt great toe. Elective procedure of his left SFA was planned and pt underwent successful recanalization of a long chronic total occlusion SFA and popliteal arteries on the left lower extremity for critical limb ischemia using the IDEV stent and chocolate balloon crossing using the Viance causing catheter.   He does continue to smoke and is recalcitrant to risk factor modification.  He tolerated the procedure without complications, but did develop mild fever night after procedure. His Lt foot has less pain and edema, but is erythremic. Keflex 500 mg added every 8 hours for 10 days and he will follow up with his podiatrist for further management of his Lt great toe wound.   Patient called and very concerned about his left foot due to edema and he was afraid to close back up. He continues with edema today the left foot but  not as significant as it was in the hospital and no erythema his wound on his left great toe continues to be fair and has not improved. He is worried about infection he tells me at times besides hot to touch.  I reassured the patient about the edema that after you get blood flow arterial blood flow to the foot or leg the veins may not be able to handle the demand and edema results. He has no further claudication with walking. I could not palpate the pulses but I could not do that in the hospital either.  All diuretic to help with the fluid at all check a CBC to see if he needs further antibiotic therapy.   No Known Allergies  Current Outpatient Prescriptions  Medication Sig Dispense Refill  . ALPRAZolam (XANAX) 1 MG tablet Take 1 mg by mouth 3 (three) times daily.      Marland Kitchen aspirin EC 325 MG EC tablet Take 1 tablet (325 mg total) by mouth daily.  30 tablet  0  . clopidogrel (PLAVIX) 75 MG tablet Take 1 tablet (75 mg total) by mouth daily.  30 tablet  5  . Oxycodone HCl 10 MG TABS Take 10 mg by mouth every 4 (four) hours.      . cephALEXin (KEFLEX) 500 MG capsule Take 1 capsule (500 mg total) by mouth 3 (three) times daily.  30 capsule  0  . furosemide (LASIX) 20 MG tablet Take 1 tablet (20 mg total) by mouth daily.  30 tablet  0  . nicotine (NICODERM CQ - DOSED IN MG/24 HR) 7 mg/24hr patch Place 1 patch onto the skin daily.      . potassium chloride (K-DUR) 10 MEQ tablet Take 1 tablet (10 mEq total) by mouth daily.  30 tablet  0   No current facility-administered medications for this visit.    Past Medical History  Diagnosis Date  . Chronic low back pain     "degenerative spine dx'd ~ 7 yr ago" (06/08/2013)  . COPD (chronic obstructive pulmonary disease)   . Anxiety   . Claudication   . Unexplained weight loss   . Tobacco abuse   . Hypertension     "moderately high; RX didn't help" (06/08/2013)  . PAD (peripheral artery disease), PTA/stent IDEV & chocolate baloon 06/08/13 04/13/2013     Severely reduced ABI's of 0.3 bilaterally   . Non-healing wound of lower extremity 06/09/2013  . Tobacco use 06/09/2013    Past Surgical History  Procedure Laterality Date  . Abi      04/09/13; abnormal  . Femoral artery stent Right 05/10/13    2 stents Rt SFA  . Femoral artery stent Left 06/08/2013  . Appendectomy  05/2001    VHQ:IONGEXB:MW colds or fevers, continued weight loss Skin:no rashes or ulcers CV:see HPI PUL:see HPI MS:no joint pain, no claudication Endo:no diabetes, no thyroid disease  PHYSICAL EXAM BP 110/60  Pulse 88  Ht 5\' 8"  (1.727 m)  Wt 126 lb (57.153 kg)  BMI 19.16 kg/m2 General:Pleasant affect, NAD, anxious and frustrated Skin:feet: Warm to cool and dry,-slower capillary refill in toes with bilateral feet Heart:S1S2 RRR without murmur, gallup, rub or click Lungs:clear without rales, occ. rhonchi, no wheezes Ext:lt lower foot with 1+ edema, sock size area, no rt lower ext edema, unable to palpate pedal pulses in either foot, nor could I in the hospital Neuro:alert and oriented, MAE, follows commands, + facial symmetry   ASSESSMENT AND PLAN Leg edema, left, foot Continued swelling of Lt foot.  From just above his ankle through foot.  This has continued since discharge.  I explained that once arterial blood flow improves the venous system may have trouble keeping up.  I added low dose lasix and K+.  He is to have follow up art. Dopplers done on Friday. No pain in his foot.  Non-healing wound of lower extremity, lt great toe Minimal improvement since discharge.  Crusty wound.  We did have him on cipro post procedure due to low grade fevers. Once dopplers are done and pt has had follow up with Dr. Allyson Sabal pt should return to podiatrist for further wound management.  PAD (peripheral artery disease), PTA/stent IDEV & chocolate baloon 06/08/13, Previous PTA/Stent to Rt SFA 05/10/13 Claudication has resolved.  No pain with walking.  Bil lower ext dopplers Friday and  to follow up with Dr. Allyson Sabal next week.    Tobacco use Both my nurse and I discussed his need to stop tobacco.  He is cutting back.  He is finding it extremely difficult.  Anxiety Pt very worried about the swelling in his lt foot.  I have reassured him that he is getting good blood flow but dopplers will prove that.  Most likely all venous insuff. Causing edema.

## 2013-06-22 NOTE — Assessment & Plan Note (Signed)
Both my nurse and I discussed his need to stop tobacco.  He is cutting back.  He is finding it extremely difficult.

## 2013-06-24 ENCOUNTER — Other Ambulatory Visit: Payer: Self-pay | Admitting: *Deleted

## 2013-06-24 ENCOUNTER — Other Ambulatory Visit: Payer: Self-pay

## 2013-06-24 MED ORDER — CEPHALEXIN 500 MG PO CAPS: 500.0000 mg | ORAL_CAPSULE | Freq: Two times a day (BID) | ORAL | Status: DC

## 2013-06-24 NOTE — Progress Notes (Signed)
Pt. Informed of his results and informed his Keflex 500 mg was sent to his pharmacy. Pt stated understanding of this information

## 2013-06-25 ENCOUNTER — Ambulatory Visit (HOSPITAL_COMMUNITY)
Admission: RE | Admit: 2013-06-25 | Discharge: 2013-06-25 | Disposition: A | Discharge status: Home / Self Care | Payer: Medicare Other | Source: Ambulatory Visit | Attending: Cardiology | Admitting: Cardiology

## 2013-06-25 DIAGNOSIS — I70219 Atherosclerosis of native arteries of extremities with intermittent claudication, unspecified extremity: Secondary | ICD-10-CM

## 2013-06-25 DIAGNOSIS — Z9889 Other specified postprocedural states: Secondary | ICD-10-CM | POA: Insufficient documentation

## 2013-06-25 DIAGNOSIS — S91109A Unspecified open wound of unspecified toe(s) without damage to nail, initial encounter: Secondary | ICD-10-CM | POA: Insufficient documentation

## 2013-06-25 DIAGNOSIS — S91109D Unspecified open wound of unspecified toe(s) without damage to nail, subsequent encounter: Secondary | ICD-10-CM

## 2013-06-25 DIAGNOSIS — Z48812 Encounter for surgical aftercare following surgery on the circulatory system: Secondary | ICD-10-CM

## 2013-06-25 DIAGNOSIS — Z959 Presence of cardiac and vascular implant and graft, unspecified: Secondary | ICD-10-CM

## 2013-06-25 DIAGNOSIS — X58XXXA Exposure to other specified factors, initial encounter: Secondary | ICD-10-CM | POA: Insufficient documentation

## 2013-06-25 DIAGNOSIS — I739 Peripheral vascular disease, unspecified: Secondary | ICD-10-CM | POA: Insufficient documentation

## 2013-06-25 NOTE — Progress Notes (Signed)
Arterial Duplex Lower Ext. completed for status post SFA stents. Marilynne Halsted, BS, RDMS, RVT

## 2013-06-29 ENCOUNTER — Encounter (HOSPITAL_COMMUNITY): Payer: Medicare Other

## 2013-07-02 ENCOUNTER — Telehealth: Payer: Self-pay | Admitting: *Deleted

## 2013-07-02 ENCOUNTER — Encounter: Payer: Self-pay | Admitting: Cardiovascular Disease

## 2013-07-02 ENCOUNTER — Ambulatory Visit (INDEPENDENT_AMBULATORY_CARE_PROVIDER_SITE_OTHER): Payer: Medicare Other | Admitting: Cardiovascular Disease

## 2013-07-02 VITALS — BP 110/83 | HR 96 | Ht 68.0 in | Wt 127.4 lb

## 2013-07-02 DIAGNOSIS — F17200 Nicotine dependence, unspecified, uncomplicated: Secondary | ICD-10-CM

## 2013-07-02 DIAGNOSIS — E782 Mixed hyperlipidemia: Secondary | ICD-10-CM

## 2013-07-02 DIAGNOSIS — I739 Peripheral vascular disease, unspecified: Secondary | ICD-10-CM

## 2013-07-02 DIAGNOSIS — Z720 Tobacco use: Secondary | ICD-10-CM

## 2013-07-02 DIAGNOSIS — I10 Essential (primary) hypertension: Secondary | ICD-10-CM

## 2013-07-02 DIAGNOSIS — Z79899 Other long term (current) drug therapy: Secondary | ICD-10-CM

## 2013-07-02 NOTE — Telephone Encounter (Signed)
Message copied by Marella Bile on Fri Jul 02, 2013 10:34 AM ------      Message from: Runell Gess      Created: Fri Jul 02, 2013  6:41 AM       Signif improvement in bilateral ABIs a s a result of intervention. Repeat in 6 months ------

## 2013-07-02 NOTE — Patient Instructions (Signed)
Your physician wants you to follow-up in: 6 months with Dr Allyson Sabal.  You will receive a reminder letter in the mail two months in advance. If you don't receive a letter, please call our office to schedule the follow-up appointment.  Please have blood work done at [facility]. (fasting)

## 2013-07-02 NOTE — Assessment & Plan Note (Signed)
Recalcitrant to risk factor modification 

## 2013-07-02 NOTE — Progress Notes (Signed)
07/02/2013 Christopher Pena   14-Apr-1946  [id number]  Primary Physician No PCP Per Patient Primary Cardiologist: Runell Gess MD [name]   HPI:  Mr. Pellum is a 67 year old thin and cachectic appearing divorced Caucasian male father of 2, grandfather 52 grandchildren is a retired Customer service manager. He is referred by Dr. Rennis Golden for cardiovascular evaluation. Factors include 50-100-pack-year history of tobacco use having smoked one pack a day recently and tried to stop the cigarettes. He does have hypertension. He's never had a heart attack or stroke. He does of COPD. He complained of several months of lifestyle limiting claudication which has gotten worse now unable to walk more than 20 yards. He does have resting pain at night. Dopplers performed in office on 04/16/13 revealed a right ABI 0.23 and left appropriate suggesting critical limb ischemia. A subsequent Myoview stress test was low risk. On 05/10/13 a performed r angiography demonstrating occluded SFAs bilaterally. I recanalize his right SFA which resulted in improvement in his right lower [name] location and healing of his wound. He continued to have left lower extremity claudication and nonhealing wound.I performed percutaneous opacification of his left lower extremity on 06/08/13 using IDEV self expanding stents.his followup arterial Doppler studies performed 06/25/13 were completely normal. His pain is resolved and his wounds are healing. He does continue to smoke however   Current Outpatient Prescriptions  Medication Sig Dispense Refill  . ALPRAZolam (XANAX) 1 MG tablet Take 1 mg by mouth 3 (three) times daily.      Marland Kitchen aspirin EC 325 MG EC tablet Take 1 tablet (325 mg total) by mouth daily.  30 tablet  0  . cephALEXin (KEFLEX) 500 MG capsule Take 1 capsule (500 mg total) by mouth 2 (two) times daily.  40 capsule  0  . clopidogrel (PLAVIX) 75 MG tablet Take 1 tablet (75 mg total) by mouth daily.  30 tablet  5  .  furosemide (LASIX) 20 MG tablet Take 1 tablet (20 mg total) by mouth daily.  30 tablet  0  . nicotine (NICODERM CQ - DOSED IN MG/24 HR) 7 mg/24hr patch Place 1 patch onto the skin daily.      . Oxycodone HCl 10 MG TABS Take 10 mg by mouth every 4 (four) hours.      . potassium chloride (K-DUR) 10 MEQ tablet Take 1 tablet (10 mEq total) by mouth daily.  30 tablet  0   No current facility-administered medications for this visit.    No Known Allergies  History   Social History  . Marital Status: Divorced    Spouse Name: N/A    Number of Children: N/A  . Years of Education: N/A   Occupational History  . former golf pro    Social History Main Topics  . Smoking status: Current Every Day Smoker -- 0.50 packs/day for 50 years    Types: Cigarettes    Start date: 07/03/1963  . Smokeless tobacco: Never Used  . Alcohol Use: Yes     Comment: 06/08/2013 "quit 02/2007; drank my share before that though"  . Drug Use: No  . Sexual Activity: Yes   Other Topics Concern  . Not on file   Social History Narrative  . No narrative on file     Review of Systems: General: negative for chills, fever, night sweats or weight changes.  Cardiovascular: negative for chest pain, dyspnea on exertion, edema, orthopnea, palpitations, paroxysmal nocturnal dyspnea or shortness of breath Dermatological: negative for rash Respiratory:  negative for cough or wheezing Urologic: negative for hematuria Abdominal: negative for nausea, vomiting, diarrhea, bright red blood per rectum, melena, or hematemesis Neurologic: negative for visual changes, syncope, or dizziness All other systems reviewed and are otherwise negative except as noted above.    Blood pressure 110/83, pulse 96, height 5\' 8"  (1.727 m), weight 127 lb 6.4 oz (57.788 kg).  General appearance: alert and no distress Neck: no adenopathy, no carotid bruit, no JVD, supple, symmetrical, trachea midline and thyroid not enlarged, symmetric, no  tenderness/mass/nodules Lungs: clear to auscultation bilaterally Heart: regular rate and rhythm, S1, S2 normal, no murmur, click, rub or gallop Extremities: extremities normal, atraumatic, no cyanosis or edema and 2+ pedal pulses with mild ankle edema on the left  EKG not performed today  ASSESSMENT AND PLAN:   PAD (peripheral artery disease), PTA/stent IDEV & chocolate baloon 06/08/13, Previous PTA/Stent to Rt SFA 05/10/13 Status post bilateral SFA intervention right prior to left which was recently performed 06/08/13 using IDEV self expanding stents.His discomfort/ critical limb ischemia  has resolved as has as has his claudication.his followup lower extremity arterial Dopplers performed 06/25/13 normalized. He did have some edema in his left foot which is not unusual after recanalization of a chronic total occlusion. Continue to monitor him noninvasively.  Tobacco use Recalcitrant to risk factor modification  HTN (hypertension) Well-controlled on current medications      Runell Gess MD [facility], [facility] 07/02/2013 10:22 AM

## 2013-07-02 NOTE — Assessment & Plan Note (Signed)
Well-controlled on current medications 

## 2013-07-02 NOTE — Assessment & Plan Note (Signed)
Status post bilateral SFA intervention right prior to left which was recently performed 06/08/13 using IDEV self expanding stents.His discomfort/ critical limb ischemia  has resolved as has as has his claudication.his followup lower extremity arterial Dopplers performed 06/25/13 normalized. He did have some edema in his left foot which is not unusual after recanalization of a chronic total occlusion. Continue to monitor him noninvasively.

## 2013-07-02 NOTE — Telephone Encounter (Signed)
Order placed for repeat low ext doppler in 6 months

## 2013-10-26 ENCOUNTER — Encounter: Payer: Self-pay | Admitting: Cardiovascular Disease

## 2013-10-26 ENCOUNTER — Ambulatory Visit (HOSPITAL_COMMUNITY)
Admission: RE | Admit: 2013-10-26 | Discharge: 2013-10-26 | Disposition: A | Discharge status: Home / Self Care | Payer: Medicare Other | Source: Ambulatory Visit | Attending: Cardiovascular Disease | Admitting: Cardiovascular Disease

## 2013-10-26 ENCOUNTER — Encounter (HOSPITAL_COMMUNITY): Payer: Self-pay | Admitting: Pharmacy Technician

## 2013-10-26 ENCOUNTER — Ambulatory Visit (INDEPENDENT_AMBULATORY_CARE_PROVIDER_SITE_OTHER): Payer: Medicare Other | Admitting: Cardiovascular Disease

## 2013-10-26 VITALS — BP 102/82 | HR 104 | Ht 68.0 in | Wt 129.2 lb

## 2013-10-26 DIAGNOSIS — I739 Peripheral vascular disease, unspecified: Secondary | ICD-10-CM | POA: Insufficient documentation

## 2013-10-26 DIAGNOSIS — S81809A Unspecified open wound, unspecified lower leg, initial encounter: Secondary | ICD-10-CM

## 2013-10-26 DIAGNOSIS — Z79899 Other long term (current) drug therapy: Secondary | ICD-10-CM

## 2013-10-26 DIAGNOSIS — I10 Essential (primary) hypertension: Secondary | ICD-10-CM

## 2013-10-26 DIAGNOSIS — Z01818 Encounter for other preprocedural examination: Secondary | ICD-10-CM

## 2013-10-26 DIAGNOSIS — D689 Coagulation defect, unspecified: Secondary | ICD-10-CM

## 2013-10-26 NOTE — Progress Notes (Signed)
10/26/2013 Christopher Pena   24-Nov-1945  [id number]  Primary Physician No PCP Per Patient Primary Cardiologist: Runell Gess MD [name]   HPI:  Christopher Pena is a 68 year old thin and cachectic appearing divorced Caucasian male father of 2, grandfather 54 grandchildren is a retired Customer service manager. He is referred by Dr. Rennis Golden for cardiovascular evaluation. Factors include 50-100-pack-year history of tobacco use having smoked one pack a day recently and tried to stop the cigarettes. He does have hypertension. He's never had a heart attack or stroke. He does of COPD. He complained of several months of lifestyle limiting claudication which has gotten worse now unable to walk more than 20 yards. He does have resting pain at night. Dopplers performed in office on 04/16/13 revealed a right ABI 0.23 and left appropriate suggesting critical limb ischemia. A subsequent Myoview stress test was low risk. On 05/10/13 a performed r angiography demonstrating occluded SFAs bilaterally. I recanalize his right SFA which resulted in improvement in his right lower [name] location and healing of his wound. He continued to have left lower extremity claudication and nonhealing wound.I performed percutaneous opacification of his left lower extremity on 06/08/13 using IDEV self expanding stents.his followup arterial Doppler studies performed 06/25/13 were completely normal. His pain had resolved and his wounds were healing. He does continue to smoke however. Approximately 2 weeks ago he developed sudden onset of right lower 70 pain with subsequent claudication similar to his preintervention symptoms as well as resting pain. He does admit to his not taking his Plavix for several days prior to this. He does continue to smoke.    Current Outpatient Prescriptions  Medication Sig Dispense Refill  . ALPRAZolam (XANAX) 1 MG tablet Take 1 mg by mouth 3 (three) times daily.      Marland Kitchen aspirin EC 325 MG EC tablet Take 1  tablet (325 mg total) by mouth daily.  30 tablet  0  . clopidogrel (PLAVIX) 75 MG tablet Take 1 tablet (75 mg total) by mouth daily.  30 tablet  5  . Oxycodone HCl 10 MG TABS Take 10 mg by mouth every 4 (four) hours.       No current facility-administered medications for this visit.    No Known Allergies  History   Social History  . Marital Status: Divorced    Spouse Name: N/A    Number of Children: N/A  . Years of Education: N/A   Occupational History  . former golf pro    Social History Main Topics  . Smoking status: Current Every Day Smoker -- 0.50 packs/day for 50 years    Types: Cigarettes    Start date: 07/03/1963  . Smokeless tobacco: Never Used  . Alcohol Use: Yes     Comment: 06/08/2013 "quit 02/2007; drank my share before that though"  . Drug Use: No  . Sexual Activity: Yes   Other Topics Concern  . Not on file   Social History Narrative  . No narrative on file     Review of Systems: General: negative for chills, fever, night sweats or weight changes.  Cardiovascular: negative for chest pain, dyspnea on exertion, edema, orthopnea, palpitations, paroxysmal nocturnal dyspnea or shortness of breath Dermatological: negative for rash Respiratory: negative for cough or wheezing Urologic: negative for hematuria Abdominal: negative for nausea, vomiting, diarrhea, bright red blood per rectum, melena, or hematemesis Neurologic: negative for visual changes, syncope, or dizziness All other systems reviewed and are otherwise negative except as noted above.  Blood pressure 102/82, pulse 104, height 5\' 8"  (1.727 m), weight 58.605 kg (129 lb 3.2 oz).  General appearance: alert and no distress Neck: no adenopathy, no carotid bruit, no JVD, supple, symmetrical, trachea midline and thyroid not enlarged, symmetric, no tenderness/mass/nodules Lungs: clear to auscultation bilaterally Heart: regular rate and rhythm, S1, S2 normal, no murmur, click, rub or gallop Abdomen:  soft, non-tender; bowel sounds normal; no masses,  no organomegaly Pulses: 2+ and symmetric aabsent pedal pulses on the right, 1+ on the left  EKG sinus tachycardia at 104 without ST or T wave changes  ASSESSMENT AND PLAN:   Non-healing wound of lower extremity, lt great toe Status post staged bilateral SFA intervention in the fall of last year resulting in marked improvement in his ability to ambulate and normalization of his Doppler studies. The patient developed sudden onset of left lower extremity discomfort with resting pain" similar to his preintervention symptoms approximately 3 weeks ago. He does continue to smoke. He does admit to having missed several days of his products. I cannot feel pedal pulse on that side. A tourniquet Dopplers on him and most likely based on those results bring him to undergo angiography and probable re\re intervention.      Runell GessJonathan J. Berry MD FACP,FACC,FAHA, [facility] 10/26/2013 10:37 AM

## 2013-10-26 NOTE — Progress Notes (Signed)
Right Lower Extremity Arterial Duplex Completed. °Brianna L Mazza,RVT °

## 2013-10-26 NOTE — Patient Instructions (Signed)
Dr. Allyson SabalBerry [facility]  This procedure is going to look at the bloodflow in your lower extremities.  If Dr. Allyson SabalBerry is able to open up the arteries, you will have to spend one night in the hospital.  If he is not able to open the arteries, you will be able to go home that same day.    After the procedure, you will not be allowed to drive for 3 days or push, pull, or lift anything greater than 10 lbs for one week.    You will be required to have bloodwork prior to your procedure.  Our scheduler will advise you on when this item needs to be done.      [place]  You need to have another ultrasound of your leg arteries tomorrow.

## 2013-10-26 NOTE — Assessment & Plan Note (Signed)
Status post staged bilateral SFA intervention in the fall of last year resulting in marked improvement in his ability to ambulate and normalization of his Doppler studies. The patient developed sudden onset of left lower extremity discomfort with resting pain" similar to his preintervention symptoms approximately 3 weeks ago. He does continue to smoke. He does admit to having missed several days of his products. I cannot feel pedal pulse on that side. A tourniquet Dopplers on him and most likely based on those results bring him to undergo angiography and probable re\re intervention.

## 2013-10-27 ENCOUNTER — Ambulatory Visit (HOSPITAL_BASED_OUTPATIENT_CLINIC_OR_DEPARTMENT_OTHER)
Admission: RE | Admit: 2013-10-27 | Discharge: 2013-10-27 | Disposition: A | Discharge status: Home / Self Care | Payer: Medicare Other | Source: Ambulatory Visit | Attending: Cardiovascular Disease | Admitting: Cardiovascular Disease

## 2013-10-27 DIAGNOSIS — I70219 Atherosclerosis of native arteries of extremities with intermittent claudication, unspecified extremity: Secondary | ICD-10-CM

## 2013-10-27 DIAGNOSIS — I739 Peripheral vascular disease, unspecified: Secondary | ICD-10-CM

## 2013-10-27 DIAGNOSIS — M79609 Pain in unspecified limb: Secondary | ICD-10-CM

## 2013-10-27 LAB — CBC
HCT: 42.8 % (ref 39.0–52.0)
HEMOGLOBIN: 14.9 g/dL (ref 13.0–17.0)
MCH: 31.3 pg (ref 26.0–34.0)
MCHC: 34.8 g/dL (ref 30.0–36.0)
MCV: 89.9 fL (ref 78.0–100.0)
PLATELETS: 303 10*3/uL (ref 150–400)
RBC: 4.76 MIL/uL (ref 4.22–5.81)
RDW: 13.8 % (ref 11.5–15.5)
WBC: 8.5 10*3/uL (ref 4.0–10.5)

## 2013-10-27 LAB — BASIC METABOLIC PANEL
BUN: 16 mg/dL (ref 6–23)
CHLORIDE: 101 meq/L (ref 96–112)
CO2: 29 meq/L (ref 19–32)
CREATININE: 0.79 mg/dL (ref 0.50–1.35)
Calcium: 9.2 mg/dL (ref 8.4–10.5)
GLUCOSE: 99 mg/dL (ref 70–99)
Potassium: 4.8 mEq/L (ref 3.5–5.3)
Sodium: 138 mEq/L (ref 135–145)

## 2013-10-27 LAB — PROTIME-INR
INR: 1.06 (ref <1.50)
Prothrombin Time: 13.7 seconds (ref 11.6–15.2)

## 2013-10-27 LAB — APTT: APTT: 38 s — AB (ref 24–37)

## 2013-10-27 NOTE — Progress Notes (Signed)
Arterial Lower Ext. Duplex Completed. Rekita Miotke, BS, RDMS, RVT  

## 2013-10-28 ENCOUNTER — Ambulatory Visit: Payer: Medicare Other | Admitting: Cardiovascular Disease

## 2013-10-28 ENCOUNTER — Encounter (HOSPITAL_COMMUNITY)
Admission: RE | Disposition: A | Discharge status: Home / Self Care | Payer: Self-pay | Source: Ambulatory Visit | Attending: Cardiovascular Disease

## 2013-10-28 ENCOUNTER — Ambulatory Visit (HOSPITAL_COMMUNITY)
Admission: RE | Admit: 2013-10-28 | Discharge: 2013-10-28 | Disposition: A | Discharge status: Home / Self Care | Payer: Medicare Other | Source: Ambulatory Visit | Attending: Cardiovascular Disease | Admitting: Cardiovascular Disease

## 2013-10-28 DIAGNOSIS — Y831 Surgical operation with implant of artificial internal device as the cause of abnormal reaction of the patient, or of later complication, without mention of misadventure at the time of the procedure: Secondary | ICD-10-CM | POA: Insufficient documentation

## 2013-10-28 DIAGNOSIS — I70219 Atherosclerosis of native arteries of extremities with intermittent claudication, unspecified extremity: Secondary | ICD-10-CM | POA: Insufficient documentation

## 2013-10-28 DIAGNOSIS — T82898A Other specified complication of vascular prosthetic devices, implants and grafts, initial encounter: Secondary | ICD-10-CM | POA: Insufficient documentation

## 2013-10-28 DIAGNOSIS — Z01818 Encounter for other preprocedural examination: Secondary | ICD-10-CM

## 2013-10-28 HISTORY — PX: LOWER EXTREMITY ANGIOGRAM: SHX5508

## 2013-10-28 LAB — LIPID PANEL
Cholesterol: 152 mg/dL (ref 0–200)
HDL: 55 mg/dL (ref >39)
LDL Cholesterol: 87 mg/dL (ref 0–99)
Total CHOL/HDL Ratio: 2.8 Ratio
Triglycerides: 50 mg/dL (ref <150)
VLDL: 10 mg/dL (ref 0–40)

## 2013-10-28 LAB — HEPATIC FUNCTION PANEL
ALT: 10 U/L (ref 0–53)
AST: 18 U/L (ref 0–37)
Albumin: 4.1 g/dL (ref 3.5–5.2)
Alkaline Phosphatase: 54 U/L (ref 39–117)
Bilirubin, Direct: 0.1 mg/dL (ref 0.0–0.3)
Indirect Bilirubin: 0.3 mg/dL (ref 0.2–1.2)
Total Bilirubin: 0.4 mg/dL (ref 0.2–1.2)
Total Protein: 7.4 g/dL (ref 6.0–8.3)

## 2013-10-28 SURGERY — ANGIOGRAM, LOWER EXTREMITY
Anesthesia: LOCAL

## 2013-10-28 MED ORDER — SODIUM CHLORIDE 0.9 % IV SOLN: INTRAVENOUS | Status: AC

## 2013-10-28 MED ORDER — HEPARIN (PORCINE) IN NACL 2-0.9 UNIT/ML-% IJ SOLN
INTRAMUSCULAR | Status: AC
  Filled 2013-10-28: qty 500

## 2013-10-28 MED ORDER — ASPIRIN 81 MG PO CHEW
81.0000 mg | CHEWABLE_TABLET | ORAL | Status: AC
  Administered 2013-10-28: 81 mg via ORAL

## 2013-10-28 MED ORDER — FENTANYL CITRATE 0.05 MG/ML IJ SOLN
INTRAMUSCULAR | Status: AC
  Filled 2013-10-28: qty 2

## 2013-10-28 MED ORDER — ASPIRIN 81 MG PO CHEW
CHEWABLE_TABLET | ORAL | Status: AC
  Filled 2013-10-28: qty 1

## 2013-10-28 MED ORDER — SODIUM CHLORIDE 0.9 % IV SOLN
INTRAVENOUS | Status: DC
  Administered 2013-10-28: 08:00:00 via INTRAVENOUS

## 2013-10-28 MED ORDER — SODIUM CHLORIDE 0.9 % IJ SOLN: 3.0000 mL | INTRAMUSCULAR | Status: DC | PRN

## 2013-10-28 MED ORDER — MORPHINE SULFATE 2 MG/ML IJ SOLN
2.0000 mg | INTRAMUSCULAR | Status: DC | PRN
  Administered 2013-10-28 (×2): 2 mg via INTRAVENOUS
  Filled 2013-10-28: qty 1

## 2013-10-28 MED ORDER — ASPIRIN EC 325 MG PO TBEC: 325.0000 mg | DELAYED_RELEASE_TABLET | Freq: Every day | ORAL | Status: DC

## 2013-10-28 MED ORDER — DIAZEPAM 5 MG PO TABS
5.0000 mg | ORAL_TABLET | ORAL | Status: AC
  Administered 2013-10-28: 5 mg via ORAL
  Filled 2013-10-28: qty 1

## 2013-10-28 MED ORDER — LIDOCAINE HCL (PF) 1 % IJ SOLN
INTRAMUSCULAR | Status: AC
  Filled 2013-10-28: qty 30

## 2013-10-28 MED ORDER — MIDAZOLAM HCL 2 MG/2ML IJ SOLN
INTRAMUSCULAR | Status: AC
  Filled 2013-10-28: qty 2

## 2013-10-28 MED ORDER — FENTANYL CITRATE 0.05 MG/ML IJ SOLN: 25.0000 ug | INTRAMUSCULAR | Status: DC | PRN

## 2013-10-28 MED ORDER — MORPHINE SULFATE 2 MG/ML IJ SOLN
INTRAMUSCULAR | Status: AC
  Filled 2013-10-28: qty 1

## 2013-10-28 NOTE — Discharge Instructions (Signed)
Angiography, Care After °Refer to this sheet in the next few weeks. These instructions provide you with information on caring for yourself after your procedure. Your health care provider may also give you more specific instructions. Your treatment has been planned according to current medical practices, but problems sometimes occur. Call your health care provider if you have any problems or questions after your procedure.  °WHAT TO EXPECT AFTER THE PROCEDURE °After your procedure, it is typical to have the following sensations: °· Minor discomfort or tenderness and a small bump at the catheter insertion site. The bump should usually decrease in size and tenderness within 1 to 2 weeks. °· Any bruising will usually fade within 2 to 4 weeks. °HOME CARE INSTRUCTIONS  °· You may need to keep taking blood thinners if they were prescribed for you. Only take over-the-counter or prescription medicines for pain, fever, or discomfort as directed by your health care provider. °· Do not apply powder or lotion to the site. °· Do not sit in a bathtub, swimming pool, or whirlpool for 5 to 7 days. °· You may shower 24 hours after the procedure. Remove the bandage (dressing) and gently wash the site with plain soap and water. Gently pat the site dry. °· Inspect the site at least twice daily. °· Limit your activity for the first 48 hours. Do not bend, squat, or lift anything over 20 lb (9 kg) or as directed by your health care provider. °· Do not drive home if you are discharged the day of the procedure. Have someone else drive you. Follow instructions about when you can drive or return to work. °SEEK MEDICAL CARE IF: °· You get lightheaded when standing up. °· You have drainage (other than a small amount of blood on the dressing). °· You have chills. °· You have a fever. °· You have redness, warmth, swelling, or pain at the insertion site. °SEEK IMMEDIATE MEDICAL CARE IF:  °· You develop chest pain or shortness of breath, feel faint,  or pass out. °· You have bleeding, swelling larger than a walnut, or drainage from the catheter insertion site. °· You develop pain, discoloration, coldness, or severe bruising in the leg or arm that held the catheter. °· You develop bleeding from any other place, such as the bowels. You may see bright red blood in your urine or stools, or your stools may appear black and tarry. °· You have heavy bleeding from the site. If this happens, hold pressure on the site. °MAKE SURE YOU: °· Understand these instructions. °· Will watch your condition. °· Will get help right away if you are not doing well or get worse. °Document Released: 02/21/2005 Document Revised: 04/07/2013 Document Reviewed: 12/28/2012 °[employer]® Patient Information ©2014 [employer]. ° °

## 2013-10-28 NOTE — CV Procedure (Signed)
Christopher Pena is a 68 y.o. male    [id number] Pena:  [facility]  PHYSICIAN: Nanetta BattyJonathan Tahisha Hakim, M.D. Dec 07, 1945   DATE OF PROCEDURE:  10/28/2013  DATE OF DISCHARGE:     PV Angiogram/Intervention    History obtained from chart review.Christopher Pena is a 68 year old thin and cachectic appearing divorced Caucasian male father of 2, grandfather 5222 grandchildren is a retired Customer service managergolf professional. He is referred by Dr. Rennis GoldenHilty for cardiovascular evaluation. Factors include 50-100-pack-year history of tobacco use having smoked one pack a day recently and tried to stop the cigarettes. He does have hypertension. He's never had a heart attack or stroke. He does of COPD. He complained of several months of lifestyle limiting claudication which has gotten worse now unable to walk more than 20 yards. He does have resting pain at night. Dopplers performed in office on 04/16/13 revealed a right ABI 0.23 and left appropriate suggesting critical limb ischemia. A subsequent Myoview stress test was low risk. On 05/10/13 a performed r angiography demonstrating occluded SFAs bilaterally. I recanalize his right SFA which resulted in improvement in his right lower [name] Pena and healing of his wound. He continued to have left lower extremity claudication and nonhealing wound.I performed percutaneous opacification of his left lower extremity on 06/08/13 using IDEV self expanding stents.his followup arterial Doppler studies performed 06/25/13 were completely normal. His pain had resolved and his wounds were healing. He does continue to smoke however. Approximately 2 weeks ago he developed sudden onset of right lower 70 pain with subsequent claudication similar to his preintervention symptoms as well as resting pain. He does admit to his not taking his Plavix for several days prior to this. He does continue to smoke.    PROCEDURE DESCRIPTION:   The patient was brought to the second floor  Cardiac cath lab in the  postabsorptive state. He was premedicated with Valium 5 mg by mouth, IV Versed and fentanyl. His left groinwas prepped and shaved in usual sterile fashion. Xylocaine 1% was used for local anesthesia. A 5 French sheath was inserted into the left common femoral  artery using standard Seldinger technique. 5 French pigtail catheters used for abdominal aortography with bifemoral runoff using bolus chase digital subtraction spelled table technique. A 5 French crossover catheter and angled catheters were used for selective angiography and oblique views  Of the distal right common femoral and below knee popliteal arteries. Visipaque dye was used for the entirety of the case. Retrograde aortic pressure was monitored during the case.   HEMODYNAMICS:    AO SYSTOLIC/AO DIASTOLIC:  157/83   Angiographic Data:   1: Bilateral iliac angiography-iliac arteries are widely patent  2: Left lower extremity-there were 40-50% tandem lesions in the proximal and mid left SFA. The stent which originated in the adductor canal and continued down across the popliteal artery into the P3 segment of the popliteal artery was patent. There were 67% in-stent restenosis within the distal edge of the stent. There was two-vessel runoff.  3: Right lower extremity- the right SFA was occluded at its origin. The P3 segment of the popliteal artery reconstituted by profunda femoris and geniculate collaterals. There was three-vessel runoff.  IMPRESSION:Christopher Pena has had probably thrombotic occlusion of his entire right SFA now with Fontaine class III claudication. He has resting pain and cyanotic toes. I do not think he has produced his options for revascularization. He is a candidate for embolectomy popliteal bypass grafting. The sheath was removed and pressure was held on the left  groin to achieve hemostasis. The patient left the Cath Lab in stable condition. He will be discharged home after remaining recumbent for 4 hours and will see him  back in the office in one week for followup.    Runell Gess MD, [facility] 10/28/2013 11:03 AM

## 2013-11-01 ENCOUNTER — Telehealth: Payer: Self-pay | Admitting: Surgery

## 2013-11-01 NOTE — Telephone Encounter (Signed)
left message informing patient of appt. with dr. Myra Gianottibrabham on 11-29-13 10 am as per staff message 10-30-13, mailed np information   bar

## 2013-11-01 NOTE — Telephone Encounter (Signed)
notified patient of r/s from 11-29-13 tp 11-08-13 at 1:00 pm for his lab and he is aware that there will be an hr. wait to see the doctor.

## 2013-11-05 ENCOUNTER — Encounter: Payer: Self-pay | Admitting: Surgery

## 2013-11-05 ENCOUNTER — Other Ambulatory Visit: Payer: Self-pay | Admitting: *Deleted

## 2013-11-05 DIAGNOSIS — I739 Peripheral vascular disease, unspecified: Secondary | ICD-10-CM

## 2013-11-05 DIAGNOSIS — Z01810 Encounter for preprocedural cardiovascular examination: Secondary | ICD-10-CM

## 2013-11-08 ENCOUNTER — Ambulatory Visit (INDEPENDENT_AMBULATORY_CARE_PROVIDER_SITE_OTHER): Payer: Medicare Other | Admitting: Surgery

## 2013-11-08 ENCOUNTER — Encounter: Payer: Self-pay | Admitting: Surgery

## 2013-11-08 ENCOUNTER — Ambulatory Visit (INDEPENDENT_AMBULATORY_CARE_PROVIDER_SITE_OTHER)
Admission: RE | Admit: 2013-11-08 | Discharge: 2013-11-08 | Disposition: A | Discharge status: Home / Self Care | Payer: Medicare Other | Source: Ambulatory Visit | Attending: Surgery | Admitting: Surgery

## 2013-11-08 ENCOUNTER — Ambulatory Visit (HOSPITAL_COMMUNITY)
Admission: RE | Admit: 2013-11-08 | Discharge: 2013-11-08 | Disposition: A | Discharge status: Home / Self Care | Payer: Medicare Other | Source: Ambulatory Visit | Attending: Surgery | Admitting: Surgery

## 2013-11-08 ENCOUNTER — Other Ambulatory Visit: Payer: Self-pay | Admitting: Surgery

## 2013-11-08 VITALS — BP 131/75 | HR 64 | Ht 68.0 in | Wt 131.0 lb

## 2013-11-08 DIAGNOSIS — Z01810 Encounter for preprocedural cardiovascular examination: Secondary | ICD-10-CM

## 2013-11-08 DIAGNOSIS — Z01818 Encounter for other preprocedural examination: Secondary | ICD-10-CM | POA: Insufficient documentation

## 2013-11-08 DIAGNOSIS — I739 Peripheral vascular disease, unspecified: Secondary | ICD-10-CM | POA: Insufficient documentation

## 2013-11-08 NOTE — Progress Notes (Signed)
Patient name: Christopher Pena MRN: [id number] DOB: 17-Apr-1946 Sex: male   Referred by: Dr. Allyson Sabal  Reason for referral:  Chief Complaint  Patient presents with  . New Evaluation    PVD c/o R LE pain and cramping when ambulating    HISTORY OF PRESENT ILLNESS: This is a pleasant 68 year old gentleman who is referred for bypass.  The patient had stents placed in bilateral superficial femoral and popliteal arteries within the past year.  The ones on the right were done for an ulcer.  His ulcer on the right has healed however the stents have occluded within 5 months.  The patient has had worsening of his claudication symptoms on the right.  He can now only ambulate approximately 20 R.'s before he has difficulty.  Take him approximately 45 minutes to get back to baseline.  He also complains of right great toe pain which keeps him up most of the night.  He has to hang his foot over the bed to alleviate some of the pain.  He also has a nonhealing area from a ingrown toenail on the right toe.  Patient's cholesterol profile has been very good with a LDL cholesterol in the 87 range.  He is not on a statin.  The patient has a greater than 50 year pack year smoking.  He does suffer from COPD but is not yet on oxygen.  He has tried to quit.  He is medically managed for hypertension with minimal improvement for medications.  Past Medical History  Diagnosis Date  . Chronic low back pain     "degenerative spine dx'd ~ 7 yr ago" (06/08/2013)  . COPD (chronic obstructive pulmonary disease)   . Anxiety   . Claudication   . Unexplained weight loss   . Tobacco abuse   . Hypertension     "moderately high; RX didn't help" (06/08/2013)  . PAD (peripheral artery disease), PTA/stent IDEV & chocolate baloon 06/08/13 04/13/2013    Severely reduced ABI's of 0.3 bilaterally   . Non-healing wound of lower extremity 06/09/2013  . Tobacco use 06/09/2013    Past Surgical History  Procedure Laterality Date  . Abi       04/09/13; abnormal  . Femoral artery stent Right 05/10/13    2 stents Rt SFA  . Femoral artery stent Left 06/08/2013  . Appendectomy  05/2001    History   Social History  . Marital Status: Divorced    Spouse Name: N/A    Number of Children: N/A  . Years of Education: N/A   Occupational History  . former golf pro    Social History Main Topics  . Smoking status: Current Every Day Smoker -- 0.50 packs/day for 50 years    Types: Cigarettes    Start date: 07/03/1963  . Smokeless tobacco: Never Used  . Alcohol Use: Yes     Comment: 06/08/2013 "quit 02/2007; drank my share before that though"  . Drug Use: No  . Sexual Activity: Yes   Other Topics Concern  . Not on file   Social History Narrative  . No narrative on file    Family History  Problem Relation Age of Onset  . Hyperlipidemia Mother   . Heart disease Mother     Allergies as of 11/08/2013  . (No Known Allergies)    Current Outpatient Prescriptions on File Prior to Visit  Medication Sig Dispense Refill  . ALPRAZolam (XANAX) 1 MG tablet Take 1 mg by mouth 3 (three) times  daily as needed for anxiety.       Marland Kitchen. aspirin EC 81 MG tablet Take 81 mg by mouth daily.      . clopidogrel (PLAVIX) 75 MG tablet Take 1 tablet (75 mg total) by mouth daily.  30 tablet  5  . Oxycodone HCl 10 MG TABS Take 10 mg by mouth every 4 (four) hours.       No current facility-administered medications on file prior to visit.     REVIEW OF SYSTEMS: Cardiovascular: Positive for shortness of breath with exertion, pain in legs with walking, and when lying flat Pulmonary: Positive for productive cough. Neurologic: Positive for leg weakness.  No paresthesias, aphasia, or amaurosis. No dizziness. Hematologic: No bleeding problems or clotting disorders. Musculoskeletal: No joint pain or joint swelling. Gastrointestinal: No blood in stool or hematemesis Genitourinary: No dysuria or hematuria. Psychiatric:: No history of major  depression. Integumentary: No rashes or ulcers. Constitutional: No fever or chills.  PHYSICAL EXAMINATION: General: The patient appears their stated age.  Vital signs are BP 131/75  Pulse 64  Ht 5\' 8"  (1.727 m)  Wt 131 lb (59.421 kg)  BMI 19.92 kg/m2  SpO2 100% HEENT:  No gross abnormalities Pulmonary: Respirations are non-labored Abdomen: Soft and non-tender  Musculoskeletal: There are no major deformities.   Neurologic: No focal weakness or paresthesias are detected, Skin: dry eschar on the tip of the right great toe with dependent rubor. Psychiatric: The patient has normal affect. Cardiovascular: There is a regular rate and rhythm without significant murmur appreciated.  Palpable femoral pulses nonpalpable pedal pulses  Diagnostic Studies:  I have reviewed his angiography which reveals an occluded superficial femoral-popliteal system with reconstitution of the below knee popliteal artery.  I have ordered and reviewed his vein mapping.  He has adequate saphenous vein on the right.  Assessment:  Vascular disease Plan:  The patient will be scheduled for a right femoral to below-knee popliteal artery bypass graft for rest pain/nonhealing ulcer.  He appears to have adequate saphenous vein.  This will be performed this Friday, March 27.  He will stop his Plavix today.  We have discussed the risks and benefits of the operation including the need for future surveillance and long-term patency.  I have encouraged him to stop smoking.  I am going to start him on low-dose Lipitor despite inadequate cholesterol profile, given his short life of percutaneous stents.  I am ordering carotid Doppler studies were today   V. Charlena CrossWells Kiante Petrovich IV, M.D. [facility] [place] Office: [phone] Pager:  [phone]

## 2013-11-09 ENCOUNTER — Other Ambulatory Visit: Payer: Self-pay | Admitting: *Deleted

## 2013-11-11 ENCOUNTER — Encounter (HOSPITAL_COMMUNITY): Payer: Self-pay | Admitting: Certified Registered Nurse Anesthetist

## 2013-11-11 ENCOUNTER — Encounter (HOSPITAL_COMMUNITY): Payer: Self-pay

## 2013-11-11 ENCOUNTER — Encounter (HOSPITAL_COMMUNITY)
Admission: RE | Admit: 2013-11-11 | Discharge: 2013-11-11 | Disposition: A | Discharge status: Home / Self Care | Payer: Medicare Other | Source: Ambulatory Visit | Attending: Surgery | Admitting: Surgery

## 2013-11-11 HISTORY — DX: Shortness of breath: R06.02

## 2013-11-11 HISTORY — DX: Emphysema, unspecified: J43.9

## 2013-11-11 HISTORY — DX: Myoneural disorder, unspecified: G70.9

## 2013-11-11 LAB — URINALYSIS, ROUTINE W REFLEX MICROSCOPIC
BILIRUBIN URINE: NEGATIVE
Glucose, UA: NEGATIVE mg/dL
Ketones, ur: NEGATIVE mg/dL
Leukocytes, UA: NEGATIVE
Nitrite: NEGATIVE
Protein, ur: NEGATIVE mg/dL
Specific Gravity, Urine: 1.026 (ref 1.005–1.030)
Urobilinogen, UA: 0.2 mg/dL (ref 0.0–1.0)
pH: 6 (ref 5.0–8.0)

## 2013-11-11 LAB — CBC
HCT: 42.3 % (ref 39.0–52.0)
Hemoglobin: 14.8 g/dL (ref 13.0–17.0)
MCH: 32.5 pg (ref 26.0–34.0)
MCHC: 35 g/dL (ref 30.0–36.0)
MCV: 92.8 fL (ref 78.0–100.0)
Platelets: 258 10*3/uL (ref 150–400)
RBC: 4.56 MIL/uL (ref 4.22–5.81)
RDW: 13.7 % (ref 11.5–15.5)
WBC: 9.8 10*3/uL (ref 4.0–10.5)

## 2013-11-11 LAB — URINE MICROSCOPIC-ADD ON

## 2013-11-11 LAB — COMPREHENSIVE METABOLIC PANEL
ALBUMIN: 3.8 g/dL (ref 3.5–5.2)
ALK PHOS: 58 U/L (ref 39–117)
ALT: 10 U/L (ref 0–53)
AST: 20 U/L (ref 0–37)
BUN: 17 mg/dL (ref 6–23)
CO2: 27 mEq/L (ref 19–32)
Calcium: 9.1 mg/dL (ref 8.4–10.5)
Chloride: 100 mEq/L (ref 96–112)
Creatinine, Ser: 0.67 mg/dL (ref 0.50–1.35)
GFR calc Af Amer: >90 mL/min (ref >90)
GFR calc non Af Amer: >90 mL/min (ref >90)
Glucose, Bld: 105 mg/dL — ABNORMAL HIGH (ref 70–99)
POTASSIUM: 4.3 meq/L (ref 3.7–5.3)
SODIUM: 139 meq/L (ref 137–147)
Total Bilirubin: 0.3 mg/dL (ref 0.3–1.2)
Total Protein: 8 g/dL (ref 6.0–8.3)

## 2013-11-11 LAB — PROTIME-INR
INR: 1.02 (ref 0.00–1.49)
PROTHROMBIN TIME: 13.2 s (ref 11.6–15.2)

## 2013-11-11 LAB — ABO/RH: ABO/RH(D): A NEG

## 2013-11-11 LAB — APTT: APTT: 36 s (ref 24–37)

## 2013-11-11 LAB — SURGICAL PCR SCREEN
MRSA, PCR: NEGATIVE
STAPHYLOCOCCUS AUREUS: POSITIVE — AB

## 2013-11-11 LAB — TYPE AND SCREEN
ABO/RH(D): A NEG
Antibody Screen: NEGATIVE

## 2013-11-11 MED ORDER — CHLORHEXIDINE GLUCONATE 4 % EX LIQD
60.0000 mL | Freq: Once | CUTANEOUS | Status: DC
  Filled 2013-11-11: qty 60

## 2013-11-11 MED ORDER — DEXTROSE 5 % IV SOLN
1.5000 g | INTRAVENOUS | Status: AC
  Administered 2013-11-12: 1.5 g via INTRAVENOUS
  Filled 2013-11-11: qty 1.5

## 2013-11-11 MED ORDER — SODIUM CHLORIDE 0.9 % IV SOLN: INTRAVENOUS | Status: DC

## 2013-11-12 ENCOUNTER — Encounter (HOSPITAL_COMMUNITY)
Admission: RE | Disposition: A | Discharge status: Home Health | Payer: Self-pay | Source: Ambulatory Visit | Attending: Surgery

## 2013-11-12 ENCOUNTER — Encounter (HOSPITAL_COMMUNITY): Payer: Medicare Other | Admitting: Certified Registered Nurse Anesthetist

## 2013-11-12 ENCOUNTER — Encounter (HOSPITAL_COMMUNITY): Payer: Self-pay | Admitting: *Deleted

## 2013-11-12 ENCOUNTER — Inpatient Hospital Stay (HOSPITAL_COMMUNITY): Payer: Medicare Other | Admitting: Certified Registered Nurse Anesthetist

## 2013-11-12 ENCOUNTER — Inpatient Hospital Stay (HOSPITAL_COMMUNITY)
Admission: RE | Admit: 2013-11-12 | Discharge: 2013-11-15 | DRG: 253 | Disposition: A | Discharge status: Home Health | Payer: Medicare Other | Source: Ambulatory Visit | Attending: Surgery | Admitting: Surgery

## 2013-11-12 DIAGNOSIS — I739 Peripheral vascular disease, unspecified: Secondary | ICD-10-CM | POA: Diagnosis present

## 2013-11-12 DIAGNOSIS — Z7982 Long term (current) use of aspirin: Secondary | ICD-10-CM

## 2013-11-12 DIAGNOSIS — Z79899 Other long term (current) drug therapy: Secondary | ICD-10-CM

## 2013-11-12 DIAGNOSIS — F17200 Nicotine dependence, unspecified, uncomplicated: Secondary | ICD-10-CM | POA: Diagnosis present

## 2013-11-12 DIAGNOSIS — J4489 Other specified chronic obstructive pulmonary disease: Secondary | ICD-10-CM | POA: Diagnosis present

## 2013-11-12 DIAGNOSIS — L98499 Non-pressure chronic ulcer of skin of other sites with unspecified severity: Secondary | ICD-10-CM | POA: Diagnosis present

## 2013-11-12 DIAGNOSIS — T82898A Other specified complication of vascular prosthetic devices, implants and grafts, initial encounter: Principal | ICD-10-CM | POA: Diagnosis present

## 2013-11-12 DIAGNOSIS — L600 Ingrowing nail: Secondary | ICD-10-CM | POA: Diagnosis present

## 2013-11-12 DIAGNOSIS — L03116 Cellulitis of left lower limb: Secondary | ICD-10-CM

## 2013-11-12 DIAGNOSIS — I10 Essential (primary) hypertension: Secondary | ICD-10-CM

## 2013-11-12 DIAGNOSIS — L97909 Non-pressure chronic ulcer of unspecified part of unspecified lower leg with unspecified severity: Secondary | ICD-10-CM | POA: Diagnosis present

## 2013-11-12 DIAGNOSIS — IMO0002 Reserved for concepts with insufficient information to code with codable children: Secondary | ICD-10-CM | POA: Diagnosis present

## 2013-11-12 DIAGNOSIS — Z720 Tobacco use: Secondary | ICD-10-CM

## 2013-11-12 DIAGNOSIS — Y849 Medical procedure, unspecified as the cause of abnormal reaction of the patient, or of later complication, without mention of misadventure at the time of the procedure: Secondary | ICD-10-CM | POA: Diagnosis present

## 2013-11-12 DIAGNOSIS — I959 Hypotension, unspecified: Secondary | ICD-10-CM | POA: Diagnosis not present

## 2013-11-12 DIAGNOSIS — J449 Chronic obstructive pulmonary disease, unspecified: Secondary | ICD-10-CM | POA: Diagnosis present

## 2013-11-12 DIAGNOSIS — Z01812 Encounter for preprocedural laboratory examination: Secondary | ICD-10-CM

## 2013-11-12 DIAGNOSIS — Z7902 Long term (current) use of antithrombotics/antiplatelets: Secondary | ICD-10-CM

## 2013-11-12 DIAGNOSIS — S81809A Unspecified open wound, unspecified lower leg, initial encounter: Secondary | ICD-10-CM

## 2013-11-12 HISTORY — PX: FEMORAL-POPLITEAL BYPASS GRAFT: SHX937

## 2013-11-12 LAB — CBC
HCT: 36.5 % — ABNORMAL LOW (ref 39.0–52.0)
Hemoglobin: 12.5 g/dL — ABNORMAL LOW (ref 13.0–17.0)
MCH: 31.7 pg (ref 26.0–34.0)
MCHC: 34.2 g/dL (ref 30.0–36.0)
MCV: 92.6 fL (ref 78.0–100.0)
PLATELETS: 222 10*3/uL (ref 150–400)
RBC: 3.94 MIL/uL — AB (ref 4.22–5.81)
RDW: 13.7 % (ref 11.5–15.5)
WBC: 12.8 10*3/uL — ABNORMAL HIGH (ref 4.0–10.5)

## 2013-11-12 LAB — CREATININE, SERUM
Creatinine, Ser: 0.68 mg/dL (ref 0.50–1.35)
GFR calc non Af Amer: >90 mL/min (ref >90)

## 2013-11-12 SURGERY — BYPASS GRAFT FEMORAL-POPLITEAL ARTERY
Anesthesia: General | Site: Leg Upper | Laterality: Right

## 2013-11-12 MED ORDER — PHENYLEPHRINE HCL 10 MG/ML IJ SOLN
INTRAMUSCULAR | Status: DC | PRN
  Administered 2013-11-12: 40 ug via INTRAVENOUS

## 2013-11-12 MED ORDER — OXYCODONE HCL 5 MG PO TABS
5.0000 mg | ORAL_TABLET | Freq: Once | ORAL | Status: DC | PRN
Start: 2013-11-12 — End: 2013-11-12

## 2013-11-12 MED ORDER — SODIUM CHLORIDE 0.9 % IR SOLN
Status: DC | PRN
  Administered 2013-11-12: 07:00:00

## 2013-11-12 MED ORDER — LACTATED RINGERS IV SOLN
INTRAVENOUS | Status: DC | PRN
  Administered 2013-11-12 (×3): via INTRAVENOUS

## 2013-11-12 MED ORDER — ASPIRIN EC 81 MG PO TBEC
81.0000 mg | DELAYED_RELEASE_TABLET | Freq: Every day | ORAL | Status: DC
  Administered 2013-11-12 – 2013-11-15 (×4): 81 mg via ORAL
  Filled 2013-11-12 (×4): qty 1

## 2013-11-12 MED ORDER — PHENOL 1.4 % MT LIQD: 1.0000 | OROMUCOSAL | Status: DC | PRN

## 2013-11-12 MED ORDER — FENTANYL CITRATE 0.05 MG/ML IJ SOLN
INTRAMUSCULAR | Status: DC | PRN
  Administered 2013-11-12: 50 ug via INTRAVENOUS
  Administered 2013-11-12: 100 ug via INTRAVENOUS
  Administered 2013-11-12: 50 ug via INTRAVENOUS

## 2013-11-12 MED ORDER — HEPARIN SODIUM (PORCINE) 1000 UNIT/ML IJ SOLN
INTRAMUSCULAR | Status: AC
  Filled 2013-11-12: qty 1

## 2013-11-12 MED ORDER — OXYCODONE HCL 5 MG/5ML PO SOLN: 5.0000 mg | Freq: Once | ORAL | Status: DC | PRN

## 2013-11-12 MED ORDER — FENTANYL CITRATE 0.05 MG/ML IJ SOLN
INTRAMUSCULAR | Status: AC
  Filled 2013-11-12: qty 5

## 2013-11-12 MED ORDER — NICOTINE 21 MG/24HR TD PT24
21.0000 mg | MEDICATED_PATCH | Freq: Every day | TRANSDERMAL | Status: DC
  Administered 2013-11-12 – 2013-11-15 (×4): 21 mg via TRANSDERMAL
  Filled 2013-11-12 (×4): qty 1

## 2013-11-12 MED ORDER — LIDOCAINE HCL (CARDIAC) 20 MG/ML IV SOLN
INTRAVENOUS | Status: AC
  Filled 2013-11-12: qty 5

## 2013-11-12 MED ORDER — HYDRALAZINE HCL 20 MG/ML IJ SOLN: 10.0000 mg | INTRAMUSCULAR | Status: DC | PRN

## 2013-11-12 MED ORDER — ONDANSETRON HCL 4 MG/2ML IJ SOLN
INTRAMUSCULAR | Status: AC
  Filled 2013-11-12: qty 2

## 2013-11-12 MED ORDER — OXYCODONE HCL 5 MG PO TABS
10.0000 mg | ORAL_TABLET | ORAL | Status: DC
  Administered 2013-11-12 – 2013-11-15 (×17): 10 mg via ORAL
  Filled 2013-11-12 (×17): qty 2

## 2013-11-12 MED ORDER — DEXTROSE-NACL 5-0.45 % IV SOLN
INTRAVENOUS | Status: DC
  Administered 2013-11-12: 1000 mL via INTRAVENOUS
  Administered 2013-11-13: 18:00:00 via INTRAVENOUS

## 2013-11-12 MED ORDER — BISACODYL 10 MG RE SUPP: 10.0000 mg | Freq: Every day | RECTAL | Status: DC | PRN

## 2013-11-12 MED ORDER — HEPARIN SODIUM (PORCINE) 1000 UNIT/ML IJ SOLN
INTRAMUSCULAR | Status: DC | PRN
Start: 2013-11-12 — End: 2013-11-12
  Administered 2013-11-12 (×2): 1000 [IU] via INTRAVENOUS
  Administered 2013-11-12: 5000 [IU] via INTRAVENOUS

## 2013-11-12 MED ORDER — PROTAMINE SULFATE 10 MG/ML IV SOLN
INTRAVENOUS | Status: AC
Start: 2013-11-12 — End: 2013-11-12
  Filled 2013-11-12: qty 5

## 2013-11-12 MED ORDER — SENNOSIDES-DOCUSATE SODIUM 8.6-50 MG PO TABS
1.0000 | ORAL_TABLET | Freq: Every evening | ORAL | Status: DC | PRN
  Filled 2013-11-12: qty 1

## 2013-11-12 MED ORDER — DEXAMETHASONE SODIUM PHOSPHATE 4 MG/ML IJ SOLN
INTRAMUSCULAR | Status: AC
  Filled 2013-11-12: qty 2

## 2013-11-12 MED ORDER — SUCCINYLCHOLINE CHLORIDE 20 MG/ML IJ SOLN
INTRAMUSCULAR | Status: AC
  Filled 2013-11-12: qty 1

## 2013-11-12 MED ORDER — 0.9 % SODIUM CHLORIDE (POUR BTL) OPTIME
TOPICAL | Status: DC | PRN
  Administered 2013-11-12: 2000 mL

## 2013-11-12 MED ORDER — METOPROLOL TARTRATE 1 MG/ML IV SOLN: 2.0000 mg | INTRAVENOUS | Status: DC | PRN

## 2013-11-12 MED ORDER — PROPOFOL 10 MG/ML IV BOLUS
INTRAVENOUS | Status: DC | PRN
  Administered 2013-11-12: 120 mg via INTRAVENOUS

## 2013-11-12 MED ORDER — MIDAZOLAM HCL 2 MG/2ML IJ SOLN
INTRAMUSCULAR | Status: AC
  Filled 2013-11-12: qty 2

## 2013-11-12 MED ORDER — HYDROMORPHONE HCL PF 1 MG/ML IJ SOLN
INTRAMUSCULAR | Status: AC
  Filled 2013-11-12: qty 1

## 2013-11-12 MED ORDER — ONDANSETRON HCL 4 MG/2ML IJ SOLN: 4.0000 mg | Freq: Four times a day (QID) | INTRAMUSCULAR | Status: DC | PRN

## 2013-11-12 MED ORDER — MAGNESIUM SULFATE 40 MG/ML IJ SOLN
2.0000 g | Freq: Every day | INTRAMUSCULAR | Status: DC | PRN
  Filled 2013-11-12: qty 50

## 2013-11-12 MED ORDER — MUPIROCIN 2 % EX OINT
TOPICAL_OINTMENT | Freq: Two times a day (BID) | CUTANEOUS | Status: DC
  Administered 2013-11-12: 1 via NASAL
  Administered 2013-11-13 – 2013-11-14 (×4): via NASAL
  Filled 2013-11-12 (×2): qty 22

## 2013-11-12 MED ORDER — MUPIROCIN 2 % EX OINT
TOPICAL_OINTMENT | CUTANEOUS | Status: AC
  Administered 2013-11-12: 1
  Filled 2013-11-12: qty 22

## 2013-11-12 MED ORDER — ONDANSETRON HCL 4 MG/2ML IJ SOLN
4.0000 mg | Freq: Four times a day (QID) | INTRAMUSCULAR | Status: DC | PRN
  Filled 2013-11-12: qty 2

## 2013-11-12 MED ORDER — DOPAMINE-DEXTROSE 3.2-5 MG/ML-% IV SOLN
3.0000 ug/kg/min | INTRAVENOUS | Status: DC
  Administered 2013-11-12: 3 ug/kg/min via INTRAVENOUS
  Filled 2013-11-12: qty 250

## 2013-11-12 MED ORDER — ONDANSETRON HCL 4 MG/2ML IJ SOLN
INTRAMUSCULAR | Status: DC | PRN
  Administered 2013-11-12: 4 mg via INTRAVENOUS

## 2013-11-12 MED ORDER — HEMOSTATIC AGENTS (NO CHARGE) OPTIME
TOPICAL | Status: DC | PRN
  Administered 2013-11-12: 1 via TOPICAL

## 2013-11-12 MED ORDER — DEXTROSE 5 % IV SOLN
1.5000 g | Freq: Two times a day (BID) | INTRAVENOUS | Status: AC
  Administered 2013-11-12 – 2013-11-13 (×2): 1.5 g via INTRAVENOUS
  Filled 2013-11-12 (×2): qty 1.5

## 2013-11-12 MED ORDER — STERILE WATER FOR INJECTION IJ SOLN
INTRAMUSCULAR | Status: AC
  Filled 2013-11-12: qty 10

## 2013-11-12 MED ORDER — CLOPIDOGREL BISULFATE 75 MG PO TABS
75.0000 mg | ORAL_TABLET | Freq: Every day | ORAL | Status: DC
  Administered 2013-11-12 – 2013-11-15 (×4): 75 mg via ORAL
  Filled 2013-11-12 (×5): qty 1

## 2013-11-12 MED ORDER — LABETALOL HCL 5 MG/ML IV SOLN
10.0000 mg | INTRAVENOUS | Status: DC | PRN
  Filled 2013-11-12: qty 4

## 2013-11-12 MED ORDER — ARTIFICIAL TEARS OP OINT
TOPICAL_OINTMENT | OPHTHALMIC | Status: DC | PRN
  Administered 2013-11-12: 1 via OPHTHALMIC

## 2013-11-12 MED ORDER — DEXAMETHASONE SODIUM PHOSPHATE 4 MG/ML IJ SOLN
INTRAMUSCULAR | Status: DC | PRN
  Administered 2013-11-12: 8 mg via INTRAVENOUS

## 2013-11-12 MED ORDER — EPHEDRINE SULFATE 50 MG/ML IJ SOLN
INTRAMUSCULAR | Status: DC | PRN
  Administered 2013-11-12: 2.5 mg via INTRAVENOUS
  Administered 2013-11-12 (×3): 5 mg via INTRAVENOUS
  Administered 2013-11-12: 2.5 mg via INTRAVENOUS
  Administered 2013-11-12: 5 mg via INTRAVENOUS

## 2013-11-12 MED ORDER — MORPHINE SULFATE 2 MG/ML IJ SOLN
2.0000 mg | INTRAMUSCULAR | Status: DC | PRN
  Administered 2013-11-12: 4 mg via INTRAVENOUS
  Administered 2013-11-14: 2 mg via INTRAVENOUS
  Filled 2013-11-12: qty 2
  Filled 2013-11-12: qty 1

## 2013-11-12 MED ORDER — PANTOPRAZOLE SODIUM 40 MG PO TBEC
40.0000 mg | DELAYED_RELEASE_TABLET | Freq: Every day | ORAL | Status: DC
  Administered 2013-11-12 – 2013-11-15 (×4): 40 mg via ORAL
  Filled 2013-11-12 (×3): qty 1

## 2013-11-12 MED ORDER — PHENYLEPHRINE 40 MCG/ML (10ML) SYRINGE FOR IV PUSH (FOR BLOOD PRESSURE SUPPORT)
PREFILLED_SYRINGE | INTRAVENOUS | Status: AC
  Filled 2013-11-12: qty 10

## 2013-11-12 MED ORDER — ACETAMINOPHEN 650 MG RE SUPP: 325.0000 mg | RECTAL | Status: DC | PRN

## 2013-11-12 MED ORDER — PROTAMINE SULFATE 10 MG/ML IV SOLN
INTRAVENOUS | Status: DC | PRN
  Administered 2013-11-12: 10 mg via INTRAVENOUS
  Administered 2013-11-12 (×2): 20 mg via INTRAVENOUS

## 2013-11-12 MED ORDER — LIDOCAINE HCL (CARDIAC) 20 MG/ML IV SOLN
INTRAVENOUS | Status: DC | PRN
  Administered 2013-11-12: 80 mg via INTRAVENOUS

## 2013-11-12 MED ORDER — POTASSIUM CHLORIDE CRYS ER 20 MEQ PO TBCR: 20.0000 meq | EXTENDED_RELEASE_TABLET | Freq: Every day | ORAL | Status: DC | PRN

## 2013-11-12 MED ORDER — ALPRAZOLAM 0.5 MG PO TABS
1.0000 mg | ORAL_TABLET | Freq: Three times a day (TID) | ORAL | Status: DC | PRN
  Administered 2013-11-12 – 2013-11-15 (×4): 1 mg via ORAL
  Filled 2013-11-12 (×4): qty 2

## 2013-11-12 MED ORDER — SUCCINYLCHOLINE CHLORIDE 20 MG/ML IJ SOLN
INTRAMUSCULAR | Status: DC | PRN
  Administered 2013-11-12: 100 mg via INTRAVENOUS

## 2013-11-12 MED ORDER — EPHEDRINE SULFATE 50 MG/ML IJ SOLN
INTRAMUSCULAR | Status: AC
  Filled 2013-11-12: qty 1

## 2013-11-12 MED ORDER — ROCURONIUM BROMIDE 50 MG/5ML IV SOLN
INTRAVENOUS | Status: AC
  Filled 2013-11-12: qty 1

## 2013-11-12 MED ORDER — DOCUSATE SODIUM 100 MG PO CAPS
100.0000 mg | ORAL_CAPSULE | Freq: Every day | ORAL | Status: DC
  Administered 2013-11-13 – 2013-11-15 (×3): 100 mg via ORAL
  Filled 2013-11-12 (×3): qty 1

## 2013-11-12 MED ORDER — ALUM & MAG HYDROXIDE-SIMETH 200-200-20 MG/5ML PO SUSP
15.0000 mL | ORAL | Status: DC | PRN
  Administered 2013-11-13 – 2013-11-14 (×2): 30 mL via ORAL
  Filled 2013-11-12 (×2): qty 30

## 2013-11-12 MED ORDER — SODIUM CHLORIDE 0.9 % IV SOLN
500.0000 mL | Freq: Once | INTRAVENOUS | Status: AC | PRN
  Administered 2013-11-12 (×2): 500 mL via INTRAVENOUS

## 2013-11-12 MED ORDER — ARTIFICIAL TEARS OP OINT
TOPICAL_OINTMENT | OPHTHALMIC | Status: AC
  Filled 2013-11-12: qty 3.5

## 2013-11-12 MED ORDER — GUAIFENESIN-DM 100-10 MG/5ML PO SYRP: 15.0000 mL | ORAL_SOLUTION | ORAL | Status: DC | PRN

## 2013-11-12 MED ORDER — ENOXAPARIN SODIUM 40 MG/0.4ML ~~LOC~~ SOLN
40.0000 mg | SUBCUTANEOUS | Status: DC
  Administered 2013-11-12 – 2013-11-14 (×3): 40 mg via SUBCUTANEOUS
  Filled 2013-11-12 (×4): qty 0.4

## 2013-11-12 MED ORDER — PROPOFOL 10 MG/ML IV BOLUS
INTRAVENOUS | Status: AC
  Filled 2013-11-12: qty 20

## 2013-11-12 MED ORDER — ACETAMINOPHEN 325 MG PO TABS: 325.0000 mg | ORAL_TABLET | ORAL | Status: DC | PRN

## 2013-11-12 MED ORDER — HYDROMORPHONE HCL PF 1 MG/ML IJ SOLN
0.2500 mg | INTRAMUSCULAR | Status: DC | PRN
  Administered 2013-11-12: 0.5 mg via INTRAVENOUS

## 2013-11-12 SURGICAL SUPPLY — 67 items
ADH SKN CLS APL DERMABOND .7 (GAUZE/BANDAGES/DRESSINGS) ×3
BANDAGE ELASTIC 4 VELCRO ST LF (GAUZE/BANDAGES/DRESSINGS) IMPLANT
BANDAGE ESMARK 6X9 LF (GAUZE/BANDAGES/DRESSINGS) ×1 IMPLANT
BNDG CMPR 9X6 STRL LF SNTH (GAUZE/BANDAGES/DRESSINGS) ×1
BNDG ESMARK 6X9 LF (GAUZE/BANDAGES/DRESSINGS) ×3
CANISTER SUCTION 2500CC (MISCELLANEOUS) ×3 IMPLANT
CLIP TI MEDIUM 24 (CLIP) ×3 IMPLANT
CLIP TI WIDE RED SMALL 24 (CLIP) ×6 IMPLANT
COVER PROBE W GEL 5X96 (DRAPES) ×3 IMPLANT
COVER SURGICAL LIGHT HANDLE (MISCELLANEOUS) ×3 IMPLANT
CUFF TOURNIQUET SINGLE 24IN (TOURNIQUET CUFF) IMPLANT
CUFF TOURNIQUET SINGLE 34IN LL (TOURNIQUET CUFF) ×3 IMPLANT
CUFF TOURNIQUET SINGLE 44IN (TOURNIQUET CUFF) IMPLANT
DERMABOND ADVANCED (GAUZE/BANDAGES/DRESSINGS) ×6
DERMABOND ADVANCED .7 DNX12 (GAUZE/BANDAGES/DRESSINGS) ×3 IMPLANT
DRAIN CHANNEL 15F RND FF W/TCR (WOUND CARE) IMPLANT
DRAPE WARM FLUID 44X44 (DRAPE) ×3 IMPLANT
DRAPE X-RAY CASS 24X20 (DRAPES) IMPLANT
DRSG COVADERM 4X10 (GAUZE/BANDAGES/DRESSINGS) IMPLANT
DRSG COVADERM 4X8 (GAUZE/BANDAGES/DRESSINGS) IMPLANT
ELECT REM PT RETURN 9FT ADLT (ELECTROSURGICAL) ×3
ELECTRODE REM PT RTRN 9FT ADLT (ELECTROSURGICAL) ×1 IMPLANT
EVACUATOR SILICONE 100CC (DRAIN) IMPLANT
GLOVE BIOGEL M STRL SZ7.5 (GLOVE) ×3 IMPLANT
GLOVE BIOGEL PI IND STRL 6.5 (GLOVE) ×1 IMPLANT
GLOVE BIOGEL PI IND STRL 7.0 (GLOVE) ×2 IMPLANT
GLOVE BIOGEL PI IND STRL 7.5 (GLOVE) ×2 IMPLANT
GLOVE BIOGEL PI IND STRL 8 (GLOVE) ×1 IMPLANT
GLOVE BIOGEL PI INDICATOR 6.5 (GLOVE) ×2
GLOVE BIOGEL PI INDICATOR 7.0 (GLOVE) ×4
GLOVE BIOGEL PI INDICATOR 7.5 (GLOVE) ×4
GLOVE BIOGEL PI INDICATOR 8 (GLOVE) ×2
GLOVE SS BIOGEL STRL SZ 7 (GLOVE) ×1 IMPLANT
GLOVE SUPERSENSE BIOGEL SZ 7 (GLOVE) ×2
GLOVE SURG SS PI 7.5 STRL IVOR (GLOVE) ×3 IMPLANT
GOWN STRL REUS W/ TWL LRG LVL3 (GOWN DISPOSABLE) ×2 IMPLANT
GOWN STRL REUS W/ TWL XL LVL3 (GOWN DISPOSABLE) ×4 IMPLANT
GOWN STRL REUS W/TWL LRG LVL3 (GOWN DISPOSABLE) ×6
GOWN STRL REUS W/TWL XL LVL3 (GOWN DISPOSABLE) ×12
HEMOSTAT SNOW SURGICEL 2X4 (HEMOSTASIS) ×3 IMPLANT
KIT BASIN OR (CUSTOM PROCEDURE TRAY) ×3 IMPLANT
KIT ROOM TURNOVER OR (KITS) ×3 IMPLANT
MARKER GRAFT CORONARY BYPASS (MISCELLANEOUS) IMPLANT
NS IRRIG 1000ML POUR BTL (IV SOLUTION) ×6 IMPLANT
PACK PERIPHERAL VASCULAR (CUSTOM PROCEDURE TRAY) ×3 IMPLANT
PAD ARMBOARD 7.5X6 YLW CONV (MISCELLANEOUS) ×6 IMPLANT
PADDING CAST COTTON 6X4 STRL (CAST SUPPLIES) IMPLANT
SET COLLECT BLD 21X3/4 12 (NEEDLE) IMPLANT
STOPCOCK 4 WAY LG BORE MALE ST (IV SETS) IMPLANT
SUT ETHILON 3 0 PS 1 (SUTURE) IMPLANT
SUT PROLENE 5 0 C 1 24 (SUTURE) ×3 IMPLANT
SUT PROLENE 6 0 BV (SUTURE) ×15 IMPLANT
SUT PROLENE 7 0 BV 1 (SUTURE) ×9 IMPLANT
SUT SILK 2 0 SH (SUTURE) ×3 IMPLANT
SUT SILK 3 0 (SUTURE) ×4
SUT SILK 3-0 18XBRD TIE 12 (SUTURE) ×2 IMPLANT
SUT VIC AB 2-0 CT1 27 (SUTURE) ×6
SUT VIC AB 2-0 CT1 TAPERPNT 27 (SUTURE) ×2 IMPLANT
SUT VIC AB 3-0 SH 27 (SUTURE) ×12
SUT VIC AB 3-0 SH 27X BRD (SUTURE) ×4 IMPLANT
SUT VICRYL 4-0 PS2 18IN ABS (SUTURE) ×6 IMPLANT
TOWEL OR 17X24 6PK STRL BLUE (TOWEL DISPOSABLE) ×6 IMPLANT
TOWEL OR 17X26 10 PK STRL BLUE (TOWEL DISPOSABLE) ×6 IMPLANT
TRAY FOLEY CATH 16FRSI W/METER (SET/KITS/TRAYS/PACK) ×3 IMPLANT
TUBING EXTENTION W/L.L. (IV SETS) IMPLANT
UNDERPAD 30X30 INCONTINENT (UNDERPADS AND DIAPERS) ×3 IMPLANT
WATER STERILE IRR 1000ML POUR (IV SOLUTION) ×3 IMPLANT

## 2013-11-12 NOTE — Transfer of Care (Signed)
Immediate Anesthesia Transfer of Care Note  Patient: Christopher BalesDavid H Boulanger  Procedure(s) Performed: Procedure(s): RIGHT  FEMORAL-BELOW KNEE POPLITEAL ARTERY BYPASS GRAFT USING NON- REVERSE RIGHT GREATER SAPHENOUS VEIN. (Right)  Patient Location: PACU  Anesthesia Type:General  Level of Consciousness: sedated and responds to stimulation  Airway & Oxygen Therapy: Patient Spontanous Breathing and Patient connected to face mask oxygen  Post-op Assessment: Report given to PACU RN and Post -op Vital signs reviewed and stable  Post vital signs: Reviewed and stable  Complications: No apparent anesthesia complications

## 2013-11-12 NOTE — Op Note (Signed)
Patient name: Christopher Pena MRN: [id number] DOB: 1945/12/04 Sex: male  [name]3/27/2015 Pre-operative Diagnosis: Right leg ulcer Post-operative diagnosis:  Same Surgeon:  Jorge NyBRABHAM IV, V. WELLS Assistants:  Lianne CureMaureen Collins Procedure:   Right femoral to below knee popliteal artery bypass graft using ipsilateral non-reversed translocated vein Anesthesia:  Gen. Blood Loss:  See anesthesia record Specimens:  None  Findings:  The patient had an excellent vein, however just below the knee it tapered into a very small unusable vein.  This required me to place the proximal anastomosis on the profundofemoral artery so as to have enough length for the vein to reach the below knee popliteal artery.  The valvulotome did disrupt one of the vein branches which required primary repair.  Indications:  The patient is previously undergone percutaneous revascularization.  This has now occluded.  The patient has rest pain and a wound on his right great toe.  He comes in today for surgical revascularization.  Procedure:  The patient was identified in the holding area and [facility]  The patient was then placed supine on the table. general anesthesia was administered.  The patient was prepped and draped in the usual sterile fashion.  A time out was called and antibiotics were administered.  Ultrasound was used to mark the course of the saphenous vein.  It was of excellent caliber down to just below the knee.  A longitudinal incision was made in the right groin.  V. common femoral, profunda femoral and superficial femoral artery were all individually isolated and encircled with Vesseloops.  There was posterior plaque within the distal common femoral artery that did not appear occlusive.  Next, through multiple skip incisions the great saphenous vein was fully mobilized.  Side branches were ligated between silk ties and metal clips.  I then made the below knee incision 1 fingerbreadth posterior to the tibia.  I  continued with saphenous vein harvest through this incision.  About midway through the incision the vein had multiple branches, none of which I felt would be sufficient.  I proceeded with full mobilization of the vein.  Next, the fascia was opened in the popliteal space was entered.  The below knee popliteal artery was then dissected free.  This was a healthy-appearing artery with no significant plaque.  Next, a subsartorial tunnel was created.  The vein was then removed from the body, oversewing the saphenofemoral junction with 5-0 Prolene.  The vein was then prepared on the back table.  It distended nicely to about 5 mm.  Next, the patient was fully heparinized.  Based on the length of the vein, I felt I had to move the proximal anastomosis further distally.  I exposed the profundofemoral artery down to its primary branches.  The proximal anastomosis was then prepared for the proximal profunda femoral artery.  The profunda femoral artery was occluded distally.  I also occluded the common femoral artery.  A #11 blade was used to make an arteriotomy which was extended longitudinally with Potts scissors.  The vein was spatulated and cut to fit the size of the arteriotomy.  A running anastomosis was created in a end-to-side fashion with 6-0 Prolene.  Once the anastomosis was completed all clamps were released.  I then used a valvulotome to lyse the valves.  Unfortunately the valvulotome and out one of the vein branches.  This required primary repair this area with running 6-0 Prolene.  This did not narrow the vein.  There was excellent  pulsatile flow from the vein after lysis of all of the valves.  Next, the vein was brought through the previously created tunnel, making sure that it remained oriented.  I then placed a tourniquet on the upper thigh.  The leg was exsanguinated with an Esmarch.  The tourniquet was taken to 250 mm of pressure.  The below knee popliteal artery was then opened with #11 blade and extended  longitudinally.  The vein was cut to the appropriate length and spatulated.  A end-to-side anastomosis was created with running 6-0 Prolene.  Prior to completion, the appropriate flushing maneuvers were performed and the anastomosis was completed.  The patient had graft dependent Doppler signals in the dorsalis pedis and posterior tibial artery at the end of the case.  The patient's heparin was reversed with 50 mg of protamine.  Once hemostasis was satisfactory, the vein incisions were closed with 3-0 Vicryl in 2 layers.  The below knee incision in the groin incisions were closed by reapproximating the fascia with 2-0 Vicryl in the subcutaneous tissue with several layers of 2-0 Vicryl followed by 4 Vicryl the skin.  Dermabond was applied.  There were no complications.   Disposition:  To PACU in stable condition.   Juleen China, M.D. [facility] [place] Office: [phone] Pager:  [phone]

## 2013-11-12 NOTE — Anesthesia Preprocedure Evaluation (Signed)
Anesthesia Evaluation  Patient identified by MRN, date of birth, ID band Patient awake    Reviewed: Allergy & Precautions, H&P , NPO status , Patient's Chart, lab work & pertinent test results  Airway Mallampati: II  Neck ROM: full    Dental   Pulmonary shortness of breath, COPDCurrent Smoker,          Cardiovascular hypertension, + Peripheral Vascular Disease     Neuro/Psych Anxiety  Neuromuscular disease    GI/Hepatic   Endo/Other    Renal/GU      Musculoskeletal   Abdominal   Peds  Hematology   Anesthesia Other Findings   Reproductive/Obstetrics                           Anesthesia Physical Anesthesia Plan  ASA: II  Anesthesia Plan: General   Post-op Pain Management:    Induction: Intravenous  Airway Management Planned: Oral ETT  Additional Equipment:   Intra-op Plan:   Post-operative Plan: Extubation in OR  Informed Consent: I have reviewed the patients History and Physical, chart, labs and discussed the procedure including the risks, benefits and alternatives for the proposed anesthesia with the patient or authorized representative who has indicated his/her understanding and acceptance.     Plan Discussed with: CRNA, Anesthesiologist and Surgeon  Anesthesia Plan Comments:         Anesthesia Quick Evaluation

## 2013-11-12 NOTE — Preoperative (Signed)
Beta Blockers   Reason not to administer Beta Blockers:Not Applicable 

## 2013-11-12 NOTE — Anesthesia Postprocedure Evaluation (Signed)
Anesthesia Post Note  Patient: Christopher BalesDavid H Zaccone  Procedure(s) Performed: Procedure(s) (LRB): RIGHT  FEMORAL-BELOW KNEE POPLITEAL ARTERY BYPASS GRAFT USING NON- REVERSE RIGHT GREATER SAPHENOUS VEIN. (Right)  Anesthesia type: General  Patient location: PACU  Post pain: Pain level controlled and Adequate analgesia  Post assessment: Post-op Vital signs reviewed, Patient's Cardiovascular Status Stable, Respiratory Function Stable, Patent Airway and Pain level controlled  Last Vitals:  Filed Vitals:   11/12/13 1315  BP: 101/63  Pulse:   Temp:   Resp:     Post vital signs: Reviewed and stable  Level of consciousness: awake, alert  and oriented  Complications: No apparent anesthesia complications

## 2013-11-12 NOTE — H&P (View-Only) (Signed)
Patient name: Christopher Pena MRN: [id number] DOB: 17-Apr-1946 Sex: male   Referred by: Dr. Allyson Sabal  Reason for referral:  Chief Complaint  Patient presents with  . New Evaluation    PVD c/o R LE pain and cramping when ambulating    HISTORY OF PRESENT ILLNESS: This is a pleasant 68 year old gentleman who is referred for bypass.  The patient had stents placed in bilateral superficial femoral and popliteal arteries within the past year.  The ones on the right were done for an ulcer.  His ulcer on the right has healed however the stents have occluded within 5 months.  The patient has had worsening of his claudication symptoms on the right.  He can now only ambulate approximately 20 R.'s before he has difficulty.  Take him approximately 45 minutes to get back to baseline.  He also complains of right great toe pain which keeps him up most of the night.  He has to hang his foot over the bed to alleviate some of the pain.  He also has a nonhealing area from a ingrown toenail on the right toe.  Patient's cholesterol profile has been very good with a LDL cholesterol in the 87 range.  He is not on a statin.  The patient has a greater than 50 year pack year smoking.  He does suffer from COPD but is not yet on oxygen.  He has tried to quit.  He is medically managed for hypertension with minimal improvement for medications.  Past Medical History  Diagnosis Date  . Chronic low back pain     "degenerative spine dx'd ~ 7 yr ago" (06/08/2013)  . COPD (chronic obstructive pulmonary disease)   . Anxiety   . Claudication   . Unexplained weight loss   . Tobacco abuse   . Hypertension     "moderately high; RX didn't help" (06/08/2013)  . PAD (peripheral artery disease), PTA/stent IDEV & chocolate baloon 06/08/13 04/13/2013    Severely reduced ABI's of 0.3 bilaterally   . Non-healing wound of lower extremity 06/09/2013  . Tobacco use 06/09/2013    Past Surgical History  Procedure Laterality Date  . Abi       04/09/13; abnormal  . Femoral artery stent Right 05/10/13    2 stents Rt SFA  . Femoral artery stent Left 06/08/2013  . Appendectomy  05/2001    History   Social History  . Marital Status: Divorced    Spouse Name: N/A    Number of Children: N/A  . Years of Education: N/A   Occupational History  . former golf pro    Social History Main Topics  . Smoking status: Current Every Day Smoker -- 0.50 packs/day for 50 years    Types: Cigarettes    Start date: 07/03/1963  . Smokeless tobacco: Never Used  . Alcohol Use: Yes     Comment: 06/08/2013 "quit 02/2007; drank my share before that though"  . Drug Use: No  . Sexual Activity: Yes   Other Topics Concern  . Not on file   Social History Narrative  . No narrative on file    Family History  Problem Relation Age of Onset  . Hyperlipidemia Mother   . Heart disease Mother     Allergies as of 11/08/2013  . (No Known Allergies)    Current Outpatient Prescriptions on File Prior to Visit  Medication Sig Dispense Refill  . ALPRAZolam (XANAX) 1 MG tablet Take 1 mg by mouth 3 (three) times  daily as needed for anxiety.       Marland Kitchen. aspirin EC 81 MG tablet Take 81 mg by mouth daily.      . clopidogrel (PLAVIX) 75 MG tablet Take 1 tablet (75 mg total) by mouth daily.  30 tablet  5  . Oxycodone HCl 10 MG TABS Take 10 mg by mouth every 4 (four) hours.       No current facility-administered medications on file prior to visit.     REVIEW OF SYSTEMS: Cardiovascular: Positive for shortness of breath with exertion, pain in legs with walking, and when lying flat Pulmonary: Positive for productive cough. Neurologic: Positive for leg weakness.  No paresthesias, aphasia, or amaurosis. No dizziness. Hematologic: No bleeding problems or clotting disorders. Musculoskeletal: No joint pain or joint swelling. Gastrointestinal: No blood in stool or hematemesis Genitourinary: No dysuria or hematuria. Psychiatric:: No history of major  depression. Integumentary: No rashes or ulcers. Constitutional: No fever or chills.  PHYSICAL EXAMINATION: General: The patient appears their stated age.  Vital signs are BP 131/75  Pulse 64  Ht 5\' 8"  (1.727 m)  Wt 131 lb (59.421 kg)  BMI 19.92 kg/m2  SpO2 100% HEENT:  No gross abnormalities Pulmonary: Respirations are non-labored Abdomen: Soft and non-tender  Musculoskeletal: There are no major deformities.   Neurologic: No focal weakness or paresthesias are detected, Skin: dry eschar on the tip of the right great toe with dependent rubor. Psychiatric: The patient has normal affect. Cardiovascular: There is a regular rate and rhythm without significant murmur appreciated.  Palpable femoral pulses nonpalpable pedal pulses  Diagnostic Studies:  I have reviewed his angiography which reveals an occluded superficial femoral-popliteal system with reconstitution of the below knee popliteal artery.  I have ordered and reviewed his vein mapping.  He has adequate saphenous vein on the right.  Assessment:  Vascular disease Plan:  The patient will be scheduled for a right femoral to below-knee popliteal artery bypass graft for rest pain/nonhealing ulcer.  He appears to have adequate saphenous vein.  This will be performed this Friday, March 27.  He will stop his Plavix today.  We have discussed the risks and benefits of the operation including the need for future surveillance and long-term patency.  I have encouraged him to stop smoking.  I am going to start him on low-dose Lipitor despite inadequate cholesterol profile, given his short life of percutaneous stents.  I am ordering carotid Doppler studies were today   V. Charlena CrossWells Rudransh Bellanca IV, M.D. [facility] [place] Office: [phone] Pager:  [phone]

## 2013-11-12 NOTE — Interval H&P Note (Signed)
History and Physical Interval Note:  11/12/2013 7:10 AM  Christopher Pena  has presented today for surgery, with the diagnosis of PVD with ulcer  The various methods of treatment have been discussed with the patient and family. After consideration of risks, benefits and other options for treatment, the patient has consented to  Procedure(s): BYPASS GRAFT FEMORAL-BELOW KNEE POPLITEAL ARTERY (Right) as a surgical intervention .  The patient's history has been reviewed, patient examined, no change in status, stable for surgery.  I have reviewed the patient's chart and labs.  Questions were answered to the patient's satisfaction.     [name] IV, V. WELLS

## 2013-11-13 LAB — CBC
HCT: 34.4 % — ABNORMAL LOW (ref 39.0–52.0)
HEMOGLOBIN: 12 g/dL — AB (ref 13.0–17.0)
MCH: 31.9 pg (ref 26.0–34.0)
MCHC: 34.9 g/dL (ref 30.0–36.0)
MCV: 91.5 fL (ref 78.0–100.0)
Platelets: 235 10*3/uL (ref 150–400)
RBC: 3.76 MIL/uL — ABNORMAL LOW (ref 4.22–5.81)
RDW: 13.6 % (ref 11.5–15.5)
WBC: 13.9 10*3/uL — AB (ref 4.0–10.5)

## 2013-11-13 LAB — BASIC METABOLIC PANEL
BUN: 12 mg/dL (ref 6–23)
CALCIUM: 8.5 mg/dL (ref 8.4–10.5)
CO2: 26 mEq/L (ref 19–32)
CREATININE: 0.59 mg/dL (ref 0.50–1.35)
Chloride: 101 mEq/L (ref 96–112)
GFR calc Af Amer: >90 mL/min (ref >90)
GFR calc non Af Amer: >90 mL/min (ref >90)
GLUCOSE: 150 mg/dL — AB (ref 70–99)
Potassium: 3.9 mEq/L (ref 3.7–5.3)
Sodium: 139 mEq/L (ref 137–147)

## 2013-11-13 NOTE — Evaluation (Signed)
Physical Therapy Evaluation Patient Details Name: Christopher GageDavid H Bellin MRN: [id number] DOB: [birth year] Today's Date: 11/13/2013   History of Present Illness  Right femoral to below knee popliteal artery bypass graft using ipsilateral non-reversed translocated vein  Clinical Impression  Pt adm with above dx. Pt's functional mobility is limited to pain and general weakness associated with sx and lack of activity after/leading up to sx. Pt to benefit from supervision and HHPT upon d/c to maximize independence for safe return home. Pt to benefit from skilled acute PT to address limitations listed below.     Follow Up Recommendations Home health PT;Supervision/Assistance - 24 hour    Equipment Recommendations  Rolling walker with 5" wheels    Recommendations for Other Services       Precautions / Restrictions Precautions Precautions: Fall Restrictions Weight Bearing Restrictions: Yes RLE Weight Bearing: Weight bearing as tolerated      Mobility  Bed Mobility               General bed mobility comments: not assessed. pt in chair upon PT arrival  Transfers Overall transfer level: Needs assistance Equipment used: Rolling walker (2 wheeled) Transfers: Sit to/from Stand Sit to Stand: Min assist         General transfer comment: min A to collect balance and increased time to straighten R LE. pt req verbal cues for hand placement and safety using RW.   Ambulation/Gait Ambulation/Gait assistance: Min assist Ambulation Distance (Feet): 200 Feet Assistive device: Rolling walker (2 wheeled) Gait Pattern/deviations: Step-through pattern;Decreased stride length Gait velocity: slow; guarded for falls and WBAT R LE   General Gait Details: pt reports tightness in R politeal region. min A tactile cue for redirection. verbal cues for hand placement and RW safety  Stairs            Wheelchair Mobility    Modified Rankin (Stroke Patients Only)       Balance Overall balance  assessment: Needs assistance Sitting-balance support: Feet supported Sitting balance-Leahy Scale: Fair Sitting balance - Comments: pt able to sit edge of chair for approximately 3 min with feet supported   Standing balance support: Single extremity supported Standing balance-Leahy Scale: Fair Standing balance comment: pt able to tolerate standing with unilateral UE support for approximately 3 min             High level balance activites: Backward walking High Level Balance Comments: backward walking with RW for 4 feet min guard A, no LOB     Pertinent Vitals/Pain Pt reports no significant pain  BP seated: 94/49 BP standing: 88/53    Home Living Family/patient expects to be discharged to:: Private residence Living Arrangements: Alone Available Help at Discharge: Family Type of Home: Apartment Home Access: Level entry     Home Layout: One level Home Equipment: None Additional Comments: pt able to stay with daughters after d/c. daughter's home is level entry with bedroom/bathroom on first floor. family available to get off work to provide supervision and assistance    Prior Function Level of Independence: Independent         Comments: before sx his walking was limited to short distance due to R LE pain. pt reports being able to perform all ADLs and grocery shopping Independently      Hand Dominance        Extremity/Trunk Assessment   Upper Extremity Assessment: Overall WFL for tasks assessed           Lower Extremity Assessment: RLE deficits/detail RLE  Deficits / Details: WBAT due to sx. weakness and report "tightness" R popliteal region    Cervical / Trunk Assessment: Kyphotic  Communication   Communication: No difficulties  Cognition Arousal/Alertness: Awake/alert Behavior During Therapy: WFL for tasks assessed/performed Overall Cognitive Status: Within Functional Limits for tasks assessed                      General Comments General  comments (skin integrity, edema, etc.): ulcer on R great toe, sensative to touch    Exercises        Assessment/Plan    PT Assessment Patient needs continued PT services  PT Diagnosis Difficulty walking;Generalized weakness;Acute pain   PT Problem List Decreased strength;Decreased range of motion;Decreased activity tolerance;Decreased balance;Decreased mobility;Decreased coordination;Decreased knowledge of use of DME;Decreased safety awareness  PT Treatment Interventions DME instruction;Gait training;Stair training;Functional mobility training;Therapeutic activities;Therapeutic exercise;Balance training   PT Goals (Current goals can be found in the Care Plan section) Acute Rehab PT Goals Patient Stated Goal: return home with independence PT Goal Formulation: With patient Time For Goal Achievement: 11/20/13 Potential to Achieve Goals: Good    Frequency Min 3X/week   Barriers to discharge        End of Session Equipment Utilized During Treatment: Gait belt (RW) Activity Tolerance: Patient tolerated treatment well;Patient limited by fatigue Patient left: in chair;with call bell/phone within reach         Time: 1129-1200 PT Time Calculation (min): 31 min   Charges:         PT G Codes:          Constantinos Krempasky SPT 11/13/2013, 12:32 PM

## 2013-11-13 NOTE — Progress Notes (Addendum)
[facility] [place]  Subjective  - Eating and drinking.  Ambulated in the room short distance.  He states his pain is well controlled with PO meds.  He has been on chronic narcotics for 2 years for DDD of the spine.  He has not voided yet.     Objective 87/46 76 98.3 F (36.8 C) (Oral) 20 96%  Intake/Output Summary (Last 24 hours) at 11/13/13 0914 Last data filed at 11/12/13 2000  Gross per 24 hour  Intake   1170 ml  Output   1225 ml  Net    -55 ml    Right LE incisions C/D/I no hematoma. Skin is warm to touch.  Doppler DP/PT 2+ No change to right great toe tip ulcer   Assessment/Planning: POD #1 Right femoral to below knee popliteal artery bypass graft using ipsilateral non-reversed translocated vein Will start Plavix today. He is on dopamine secondary to Low BP 4 mcg/kg/ml, he received 2 bolus 500 ml 11/12/2013.  They tried to decrease it to 3 and had to go back up to the current dose. BP is normally 130s/70s.   He is completely asymptomatic.       Clinton GallantCOLLINS, EMMA [facility] 11/13/2013 9:14 AM -- No obvious etiology for his hypotension.  Apparently received several fluid boluses and started on Dopamine yesterday.  Not sure who ordered any of this. Right leg incisions clean no hematoma, doppler signals in foot pink and warm Wean off Dopamine Check BP both arms to make sure not artificially low from subclavian stenosis Ambulate  Fabienne Brunsharles Jovonte Commins, MD [facility] [place] Office: [phone] Pager: [phone]  Laboratory Lab Results:  Recent Labs  11/12/13 1805 11/13/13 0153  WBC 12.8* 13.9*  HGB 12.5* 12.0*  HCT 36.5* 34.4*  PLT 222 235   BMET  Recent Labs  11/11/13 1243 11/12/13 1805 11/13/13 0153  NA 139  --  139  K 4.3  --  3.9  CL 100  --  101  CO2 27  --  26  GLUCOSE 105*  --  150*  BUN 17  --  12  CREATININE 0.67 0.68 0.59  CALCIUM 9.1  --  8.5    COAG Lab Results  Component Value Date    INR 1.02 11/11/2013   INR 1.06 10/26/2013   INR 1.05 06/04/2013   No results found for this basename: PTT

## 2013-11-13 NOTE — Evaluation (Signed)
Physical Therapy Evaluation Patient Details Name: Christopher GageDavid H Pena MRN: [id number] DOB: [birth year] Today's Date: 11/13/2013   History of Present Illness  Right femoral to below knee popliteal artery bypass graft using ipsilateral non-reversed translocated vein  Clinical Impression  Pt adm with above dx. Pt's functional mobility is limited to pain and general weakness associated with sx and lack of activity after/leading up to sx. Pt to benefit from supervision, use of RW, and HHPT upon d/c to maximize independence for safe return home. Pt to benefit from skilled acute PT to address limitations listed below.     Follow Up Recommendations Home health PT;Supervision/Assistance - 24 hour    Equipment Recommendations  Rolling walker with 5" wheels    Recommendations for Other Services       Precautions / Restrictions Precautions Precautions: Fall Restrictions Weight Bearing Restrictions: Yes RLE Weight Bearing: Weight bearing as tolerated      Mobility  Bed Mobility               General bed mobility comments: not assessed. pt in chair upon PT arrival  Transfers Overall transfer level: Needs assistance Equipment used: Rolling walker (2 wheeled) Transfers: Sit to/from Stand Sit to Stand: Min assist         General transfer comment: min A to collect balance and increased time to straighten R LE. pt req verbal cues for hand placement and safety using RW.   Ambulation/Gait Ambulation/Gait assistance: Min assist Ambulation Distance (Feet): 200 Feet Assistive device: Rolling walker (2 wheeled) Gait Pattern/deviations: Step-through pattern;Decreased stride length Gait velocity: slow; guarded for falls and WBAT R LE   General Gait Details: pt reports tightness in R politeal region. min A tactile cue for redirection. verbal cues for hand placement and RW safety  Stairs            Wheelchair Mobility    Modified Rankin (Stroke Patients Only)       Balance  Overall balance assessment: Needs assistance Sitting-balance support: Feet supported Sitting balance-Leahy Scale: Fair Sitting balance - Comments: pt able to sit edge of chair for approximately 3 min with feet supported   Standing balance support: Single extremity supported Standing balance-Leahy Scale: Fair Standing balance comment: pt able to tolerate standing with unilateral UE support for approximately 3 min             High level balance activites: Backward walking High Level Balance Comments: backward walking with RW for 4 feet min guard A, no LOB     Pertinent Vitals/Pain Pt reports no significant pain  BP seated: 94/49 BP standing: 88/53 RN aware of low BP    Home Living Family/patient expects to be discharged to:: Private residence Living Arrangements: Alone Available Help at Discharge: Family Type of Home: Apartment Home Access: Level entry     Home Layout: One level Home Equipment: None Additional Comments: pt able to stay with daughters after d/c. daughter's home is level entry with bedroom/bathroom on first floor. family available to get off work to provide supervision and assistance    Prior Function Level of Independence: Independent         Comments: before sx his walking was limited to short distance due to R LE pain. pt reports being able to perform all ADLs and grocery shopping Independently      Hand Dominance        Extremity/Trunk Assessment   Upper Extremity Assessment: Overall WFL for tasks assessed  Lower Extremity Assessment: RLE deficits/detail RLE Deficits / Details: WBAT due to sx. weakness and report "tightness" R popliteal region    Cervical / Trunk Assessment: Kyphotic  Communication   Communication: No difficulties  Cognition Arousal/Alertness: Awake/alert Behavior During Therapy: WFL for tasks assessed/performed Overall Cognitive Status: Within Functional Limits for tasks assessed                       General Comments General comments (skin integrity, edema, etc.): ulcer on R great toe, sensative to touch    Exercises        Assessment/Plan    PT Assessment Patient needs continued PT services  PT Diagnosis Difficulty walking;Generalized weakness;Acute pain   PT Problem List Decreased strength;Decreased range of motion;Decreased activity tolerance;Decreased balance;Decreased mobility;Decreased coordination;Decreased knowledge of use of DME;Decreased safety awareness  PT Treatment Interventions DME instruction;Gait training;Stair training;Functional mobility training;Therapeutic activities;Therapeutic exercise;Balance training   PT Goals (Current goals can be found in the Care Plan section) Acute Rehab PT Goals Patient Stated Goal: return home with independence PT Goal Formulation: With patient Time For Goal Achievement: 11/20/13 Potential to Achieve Goals: Good    Frequency Min 3X/week   Barriers to discharge        End of Session Equipment Utilized During Treatment: Gait belt (RW) Activity Tolerance: Patient tolerated treatment well;Patient limited by fatigue Patient left: in chair;with call bell/phone within reach         Time: 1129-1200 PT Time Calculation (min): 31 min   Charges:         PT G Codes:          Kohler,Andrew SPT 11/13/2013, 12:32 PM  Agree with above assessment.  Lewis Shock, PT, DPT Pager #: [phone] Office #: [phone]

## 2013-11-14 NOTE — Progress Notes (Signed)
Report called to Marcelino DusterMichelle, RN, all questions answered.  Patient texting wife of his new location.  No complaints.

## 2013-11-14 NOTE — Progress Notes (Signed)
[facility] [place]  Subjective  - Feels better, off dopamine   Objective 112/66 80 98.3 F (36.8 C) (Oral) 22 99%  Intake/Output Summary (Last 24 hours) at 11/14/13 0832 Last data filed at 11/14/13 0700  Gross per 24 hour  Intake 876.05 ml  Output   2350 ml  Net -1473.95 ml   Right leg incisions healing Triphasic DP/PT doppler flow right foot Pink and warm  Assessment/Planning: Transfer 2 W Home tomorrow  Sherren KernsFIELDS,Navea Woodrow E 11/14/2013 8:32 AM --  Laboratory Lab Results:  Recent Labs  11/12/13 1805 11/13/13 0153  WBC 12.8* 13.9*  HGB 12.5* 12.0*  HCT 36.5* 34.4*  PLT 222 235   BMET  Recent Labs  11/11/13 1243 11/12/13 1805 11/13/13 0153  NA 139  --  139  K 4.3  --  3.9  CL 100  --  101  CO2 27  --  26  GLUCOSE 105*  --  150*  BUN 17  --  12  CREATININE 0.67 0.68 0.59  CALCIUM 9.1  --  8.5    COAG Lab Results  Component Value Date   INR 1.02 11/11/2013   INR 1.06 10/26/2013   INR 1.05 06/04/2013   No results found for this basename: PTT

## 2013-11-15 ENCOUNTER — Telehealth: Payer: Self-pay | Admitting: Surgery

## 2013-11-15 DIAGNOSIS — I739 Peripheral vascular disease, unspecified: Secondary | ICD-10-CM

## 2013-11-15 NOTE — Care Management Note (Signed)
    Page 1 of 2   11/15/2013     11:26:39 AM   CARE MANAGEMENT NOTE 11/15/2013  Patient:  HASKEL, DEWALT   Account Number:  [id number]  Date Initiated:  11/15/2013  Documentation initiated by:  Kye Hedden  Subjective/Objective Assessment:   PT ADM S/P RT FEM POP BYPASS GRAFT ON 11/12/13.  PTA, PT RESIDES AT [facility].     Action/Plan:   MET WITH PT TO DISCUSS DC ARRANGEMENTS.  REFERRAL TO [facility], PER PT CHOICE.  START OF CARE 24-48H POST DC DATE.  PT NEEDS RW FOR HOME; REFERRAL TO [facility] FOR DME NEEDS.   Anticipated DC Date:  11/15/2013   Anticipated DC Plan:  [place]  CM consult      [facility] Choice  HOME HEALTH   Choice offered to / List presented to:  C-1 Patient   DME arranged  [facility] Moselle      DME agency  [facility] arranged  HH-1 RN  [place].   Status of service:  Completed, signed off Medicare Important Message given?   (If response is "NO", the following Medicare IM given date fields will be blank) Date Medicare IM given:   Date Additional Medicare IM given:    Discharge Disposition:  [place]  Per UR Regulation:  Reviewed for med. necessity/level of care/duration of stay  If discussed at [place] of Stay Meetings, dates discussed:    Comments:

## 2013-11-15 NOTE — Telephone Encounter (Addendum)
Message copied by Fredrich BirksMILLIKAN, DANA P on Mon Nov 15, 2013  4:50 PM ------      Message from: Lorin MercyMCCHESNEY, MARILYN K      Created: Mon Nov 15, 2013  1:37 PM      Regarding: Schedule                   ----- Message -----         From: Sharee PimpleMarilyn K McChesney, CMA         Sent: 11/15/2013   8:35 AM           To: Sharee PimpleMarilyn K McChesney, CMA                        ----- Message -----         From: Lars MageEmma M Collins, PA-C         Sent: 11/15/2013   7:31 AM           To: Vvs Charge Pool            Right femoral to below knee popliteal artery bypass graft using ipsilateral non-reversed translocated vein f/u with Dr. Myra GianottiBrabham in 4 weeks       ------  11/15/13: spoke with pt, dpm

## 2013-11-15 NOTE — Progress Notes (Signed)
D/c instructions reviewed with pt. Copy of instructions given to pt. Pt d/c'd via wheelchair with his belongings with his daughter, escorted by unit NT.

## 2013-11-15 NOTE — Evaluation (Signed)
Occupational Therapy Evaluation and Discharge Patient Details Name: Christopher GageDavid H Pena MRN: [id number] DOB: [birth year] Today's Date: 11/15/2013    History of Present Illness Right femoral to below knee popliteal artery bypass graft using ipsilateral non-reversed translocated vein   Clinical Impression   This 68 yo male admitted and underwent above presents to acute OT with the need for S due to fall risk with new RLE surgery. Per chart family will be able to provide this at least initially. No further OT needs, will sign off.    Follow Up Recommendations  No OT follow up    Equipment Recommendations  None recommended by OT       Precautions / Restrictions Precautions Precautions: Fall Restrictions Weight Bearing Restrictions: No RLE Weight Bearing: Weight bearing as tolerated      Mobility Bed Mobility               General bed mobility comments: Pt sitting EOB upon arrival  Transfers Overall transfer level: Needs assistance Equipment used: Rolling walker (2 wheeled) Transfers: Sit to/from Stand Sit to Stand: Supervision                   ADL                     General ADL Comments: S for all BADLs due to fall risk with RLE sugery. Advised pt to use his tub seat/bench that he has for safety in the shower and to not scrub over the incision               Pertinent Vitals/Pain "RLE much less tender with walking"     Hand Dominance Right   Extremity/Trunk Assessment Upper Extremity Assessment Upper Extremity Assessment: Overall WFL for tasks assessed           Communication Communication Communication: No difficulties   Cognition Arousal/Alertness: Awake/alert Behavior During Therapy: WFL for tasks assessed/performed Overall Cognitive Status: Within Functional Limits for tasks assessed                             Home Living Family/patient expects to be discharged to:: Private residence Living Arrangements:  Alone Available Help at Discharge: Family;Available 24 hours/day                         Home Equipment: Tub bench   Additional Comments: pt able to stay with daughters after d/c. daughter's home is level entry with bedroom/bathroom on first floor. family available to get off work to provide supervision and assistance      Prior Functioning/Environment Level of Independence: Independent        Comments: before sx his walking was limited to short distance due to R LE pain. pt reports being able to perform all ADLs and grocery shopping Independently              OT Goals(Current goals can be found in the care plan section) Acute Rehab OT Goals Patient Stated Goal: home today  OT Frequency:             End of Session: Equipment Utilized During Treatment: Rolling walker  Activity Tolerance: Patient tolerated treatment well Patient left:  (sitting EOB waiting on daughter)   Time: [id number] OT Time Calculation (min): 9 min Charges:  OT General Charges $OT Visit: 1 Procedure OT Evaluation $Initial OT Evaluation Tier I: 1 Procedure   Reina FuseLeonard, [name]  [name] [phone] 11/15/2013, 3:04 PM

## 2013-11-15 NOTE — Progress Notes (Signed)
[facility] [place]  Subjective  - Medial calf to medial ankle sensation is decreased.   He did not walk yesterday.  He needs a rolling walker for home.  He is also on pain management and will not need a prescription from us to go home.   Objective 112/61 88 100 F (37.8 C) (Oral) 18 97%  Intake/Output Summary (Last 24 hours) at 11/15/13 0749 Last data filed at 11/15/13 0539  Gross per 24 hour  Intake      0 ml  Output   1100 ml  Net  -1100 ml    Incision are C/D/I Medial calf decreased sensation to touch Foot sensation is intact and equal bil. Triphasic DP/PT doppler flow right foot   Assessment/Planning: POD #3 Right femoral to below knee popliteal artery bypass graft using ipsilateral non-reversed translocated vein BP stable Voided , needs to ambulate HH and rolling walker ordered D/C home after PT    Clinton GallantCOLLINS, Savonna Birchmeier [facility] 11/15/2013 7:49 AM --  Laboratory Lab Results:  Recent Labs  11/12/13 1805 11/13/13 0153  WBC 12.8* 13.9*  HGB 12.5* 12.0*  HCT 36.5* 34.4*  PLT 222 235   BMET  Recent Labs  11/12/13 1805 11/13/13 0153  NA  --  139  K  --  3.9  CL  --  101  CO2  --  26  GLUCOSE  --  150*  BUN  --  12  CREATININE 0.68 0.59  CALCIUM  --  8.5    COAG Lab Results  Component Value Date   INR 1.02 11/11/2013   INR 1.06 10/26/2013   INR 1.05 06/04/2013   No results found for this basename: PTT

## 2013-11-15 NOTE — Progress Notes (Signed)
Physical Therapy Treatment Patient Details Name: Felicita GageDavid H Granderson MRN: [id number] DOB: 10/06/1945 Today's Date: 11/15/2013    History of Present Illness Right femoral to below knee popliteal artery bypass graft using ipsilateral non-reversed translocated vein    PT Comments    Progressing well. Should be safe with RW in home-like environment.  RW ordered.  No home PT needs.  Follow Up Recommendations  Home health PT;Supervision/Assistance - 24 hour     Equipment Recommendations  Rolling walker with 5" wheels    Recommendations for Other Services       Precautions / Restrictions Precautions Precautions: Fall Restrictions RLE Weight Bearing: Weight bearing as tolerated    Mobility  Bed Mobility Overal bed mobility: Modified Independent                Transfers Overall transfer level: Modified independent Equipment used: Rolling walker (2 wheeled) Transfers: Sit to/from UGI CorporationStand;Stand Pivot Transfers Sit to Stand: Supervision         General transfer comment: intially less steady due to R leg pain and decr ability to extend knee for weightbearing  Ambulation/Gait Ambulation/Gait assistance: Supervision Ambulation Distance (Feet): 130 Feet Assistive device: Rolling walker (2 wheeled) Gait Pattern/deviations: Step-to pattern;Step-through pattern;Antalgic Gait velocity: slow; guarded   Gait velocity interpretation: Below normal speed for age/gender General Gait Details: antalgic with progressively lessing limp and flexed posture as he worked the soreness out.   Stairs            Wheelchair Mobility    Modified Rankin (Stroke Patients Only)       Balance Overall balance assessment: Needs assistance Sitting-balance support: Feet supported Sitting balance-Leahy Scale: Good                              Cognition Arousal/Alertness: Awake/alert Behavior During Therapy: WFL for tasks assessed/performed Overall Cognitive Status: Within  Functional Limits for tasks assessed                      Exercises Other Exercises Other Exercises: taught and completed 10 reps each of quad sets, a/p and calf stretch with sheet.  Discussed doing 10-15 reps 3 times /day with holds.    General Comments        Pertinent Vitals/Pain     Home Living                      Prior Function            PT Goals (current goals can now be found in the care plan section) Acute Rehab PT Goals Patient Stated Goal: return home with independence PT Goal Formulation: With patient Time For Goal Achievement: 11/20/13 Potential to Achieve Goals: Good Progress towards PT goals: Progressing toward goals    Frequency  Min 3X/week    PT Plan Current plan remains appropriate    End of Session   Activity Tolerance: Patient tolerated treatment well;Patient limited by fatigue Patient left: in bed;with call bell/phone within reach     Time: 0951-1021 PT Time Calculation (min): 30 min  Charges:  $Gait Training: 8-22 mins $Therapeutic Exercise: 8-22 mins                    G Codes:      Merinda Victorino, Eliseo GumKenneth V 11/15/2013, 10:37 AM  11/15/2013  Butte BingKen Salam Micucci, PT [phone]  (pager)

## 2013-11-15 NOTE — Progress Notes (Signed)
VASCULAR LAB PRELIMINARY  ARTERIAL  ABI completed:    RIGHT    LEFT    PRESSURE WAVEFORM  PRESSURE WAVEFORM  BRACHIAL  triphasic BRACHIAL 120 triphasic  DP 101 monophasic DP 107 biphasic  AT   AT    PT 97 monophasic PT 123 Dampened monophasic  PER   PER    GREAT TOE  NA GREAT TOE  NA    RIGHT LEFT  ABI 0.84 >1.0     Leyton Brownlee, RVT 11/15/2013, 1:24 PM

## 2013-11-16 ENCOUNTER — Encounter (HOSPITAL_COMMUNITY): Payer: Self-pay | Admitting: Surgery

## 2013-11-16 NOTE — Discharge Summary (Signed)
[facility] Discharge Summary   Patient ID:  Christopher Pena MRN: [id number] DOB/AGE: [birth year] 68 y.o.  Admit date: 11/12/2013 Discharge date: 11/16/2013 Date of Surgery: 11/12/2013 Surgeon: Surgeon(s): Nada Libman, MD  Admission Diagnosis: PVD with ulcer  Discharge Diagnoses:  PVD with ulcer  Secondary Diagnoses: Past Medical History  Diagnosis Date  . Chronic low back pain     "degenerative spine dx'd ~ 7 yr ago" (06/08/2013)  . COPD (chronic obstructive pulmonary disease)   . Anxiety   . Claudication   . Unexplained weight loss   . Tobacco abuse   . Hypertension     "moderately high; RX didn't help" (06/08/2013)  . PAD (peripheral artery disease), PTA/stent IDEV & chocolate baloon 06/08/13 04/13/2013    Severely reduced ABI's of 0.3 bilaterally   . Non-healing wound of lower extremity 06/09/2013  . Tobacco use 06/09/2013  . Emphysema of lung   . Shortness of breath     w/ exertion  . Neuromuscular disorder     degen spine    Procedure(s): RIGHT  FEMORAL-BELOW KNEE POPLITEAL ARTERY BYPASS GRAFT USING NON- REVERSE RIGHT GREATER SAPHENOUS VEIN.  Discharged Condition: good  HPI: 68 y/o male Referred by Dr. Allyson Sabal secondary to occluded stents.  The patient had stents placed in bilateral superficial femoral and popliteal arteries within the past year. The ones on the right were done for an ulcer. His ulcer on the right has healed however the stents have occluded within 5 months. The patient has had worsening of his claudication symptoms on the right. He can now only ambulate approximately 20 R.'s before he has difficulty. Take him approximately 45 minutes to get back to baseline. He also complains of right great toe pain which keeps him up most of the night. He has to hang his foot over the bed to alleviate some of the pain. He also has a nonhealing area from a ingrown toenail on the right toe.  He underwent Right femoral to below knee popliteal artery  bypass graft using ipsilateral non-reversed translocated vein.  He has had low BP that has required Dopamine.  Post-op day 2 he was off the Dopamine and stable.  He was transferred to 2W.  A rolling walker was ordered for home and he was discharged with home PT set up.   Hospital Course:  BRITNEY CAPTAIN is a 68 y.o. male is S/P Right Procedure(s): RIGHT  FEMORAL-BELOW KNEE POPLITEAL ARTERY BYPASS GRAFT USING NON- REVERSE RIGHT GREATER SAPHENOUS VEIN. Extubated: POD # 0 Physical exam: Incision are C/D/I  Medial calf decreased sensation to touch  Foot sensation is intact and equal bil.  Triphasic DP/PT doppler flow right foot  Post-op wounds healing well Pt. Ambulating, voiding and taking PO diet without difficulty. Pt pain controlled with PO pain meds. Labs as below Complications:See HPI  Consults:     Significant Diagnostic Studies: CBC Lab Results  Component Value Date   WBC 13.9* 11/13/2013   HGB 12.0* 11/13/2013   HCT 34.4* 11/13/2013   MCV 91.5 11/13/2013   PLT 235 11/13/2013    BMET    Component Value Date/Time   NA 139 11/13/2013 0153   K 3.9 11/13/2013 0153   CL 101 11/13/2013 0153   CO2 26 11/13/2013 0153   GLUCOSE 150* 11/13/2013 0153   BUN 12 11/13/2013 0153   CREATININE 0.59 11/13/2013 0153   CREATININE 0.79 10/26/2013 1334   CALCIUM 8.5 11/13/2013 0153   GFRNONAA >90 11/13/2013 0153  GFRAA >90 11/13/2013 0153   COAG Lab Results  Component Value Date   INR 1.02 11/11/2013   INR 1.06 10/26/2013   INR 1.05 06/04/2013     Disposition:  Discharge to :Home Discharge Orders   Future Appointments Provider Department Dept Phone   12/13/2013 10:30 AM Nada LibmanVance W Brabham, MD [facility] [phone]   Future Orders Complete By Expires   Call MD for:  redness, tenderness, or signs of infection (pain, swelling, bleeding, redness, odor or green/yellow discharge around incision site)  As directed    Call MD for:  severe or increased pain, loss or  decreased feeling  in affected limb(s)  As directed    Call MD for:  temperature >100.5  As directed    Driving Restrictions  As directed    Comments:     No driving for 2 weeks   Increase activity slowly  As directed    Comments:     Walk with assistance use walker or cane as needed   Lifting restrictions  As directed    Comments:     No lifting for 6 weeks   May shower   As directed    Resume previous diet  As directed        Medication List         ALPRAZolam 1 MG tablet  Commonly known as:  XANAX  Take 1 mg by mouth 3 (three) times daily as needed for anxiety.     aspirin EC 81 MG tablet  Take 81 mg by mouth daily.     clopidogrel 75 MG tablet  Commonly known as:  PLAVIX  Take 1 tablet (75 mg total) by mouth daily.     Oxycodone HCl 10 MG Tabs  Take 10 mg by mouth every 4 (four) hours.       Verbal and written Discharge instructions given to the patient. Wound care per Discharge AVS     Follow-up Information   Follow up with Myra GianottiBRABHAM IV, Lala LundV. WELLS, MD In 4 weeks.   Specialty:  Vascular Surgery   Contact information:   [address] [place] [address] [phone]       Signed: Clinton GallantCOLLINS, Kayl [facility] 11/16/2013, 3:58 PM  - For VQI Registry use --- Instructions: Press F2 to tab through selections.  Delete question if not applicable.   Post-op:  Wound infection: No  Graft infection: No  Transfusion: No  If yes,  units given New Arrhythmia: No Ipsilateral amputation: [ ]  no, [ ]  Minor, [ ]  BKA, [ ]  AKA Discharge patency: [ ]  Primary, [ ]  Primary assisted, [ ]  Secondary, [ ]  Occluded Patency judged by: [ ]  Dopper only, [ ]  Palpable graft pulse, [ ]  Palpable distal pulse, [ ]  ABI inc. > 0.15, [ ]  Duplex Discharge ABI: R 0.84, L >1.0 D/C Ambulatory Status: Ambulatory with Assistance  Complications: MI: [x ] No, [ ]  Troponin only, [ ]  EKG or Clinical CHF: No Resp failure: [x ] none, [ ]  Pneumonia, [ ]  Ventilator Chg in renal function: [x ] none, [ ]   Inc. Cr > 0.5, [ ]  Temp. Dialysis, [ ]  Permanent dialysis Stroke: [x ] None, [ ]  Minor, [ ]  Major Return to OR: No  Reason for return to OR: [ ]  Bleeding, [ ]  Infection, [ ]  Thrombosis, [ ]  Revision  Discharge medications: Statin use:  No  for medical reason   ASA use:  Yes Plavix use:  Yes Beta blocker use:  No  for medical reason   Coumadin use: No  for medical reason

## 2013-11-17 NOTE — Discharge Summary (Signed)
I agree with the above  Christopher Pena 

## 2013-11-29 ENCOUNTER — Encounter: Payer: Medicare Other | Admitting: Surgery

## 2013-11-29 ENCOUNTER — Encounter (HOSPITAL_COMMUNITY): Payer: Medicare Other

## 2013-12-08 ENCOUNTER — Telehealth: Payer: Self-pay

## 2013-12-08 NOTE — Telephone Encounter (Signed)
Phone call from pt.  Reported approx. 1" of incision, of right lower leg, has opened-up.  Reported redness and swelling in the surrounding tissue.  Stated there is a "clear with yellow-tinged fluid" that is draining from the incision.   Denied any fever/ chills.  Appt. given for 11:20 AM 12/09/13.  Agreed with plan.

## 2013-12-09 ENCOUNTER — Ambulatory Visit (INDEPENDENT_AMBULATORY_CARE_PROVIDER_SITE_OTHER): Payer: Self-pay | Admitting: Family

## 2013-12-09 ENCOUNTER — Encounter: Payer: Self-pay | Admitting: Family

## 2013-12-09 VITALS — BP 125/81 | HR 80 | Temp 97.4°F | Resp 16 | Ht 68.0 in | Wt 128.0 lb

## 2013-12-09 DIAGNOSIS — L24A9 Irritant contact dermatitis due friction or contact with other specified body fluids: Secondary | ICD-10-CM

## 2013-12-09 DIAGNOSIS — Z4889 Encounter for other specified surgical aftercare: Secondary | ICD-10-CM | POA: Insufficient documentation

## 2013-12-09 DIAGNOSIS — T148XXA Other injury of unspecified body region, initial encounter: Secondary | ICD-10-CM | POA: Insufficient documentation

## 2013-12-09 NOTE — Progress Notes (Signed)
[facility] [place] HISTORY AND PHYSICAL -PAD  History of Present Illness Christopher Pena is a 68 y.o. male patient of Dr. Myra GianottiBrabham who is s/p right femoral to below the knee popliteal artery bypass graft using ipsilateral non-reversed translocated vein on 11/12/2013. He returns today for c/o incision infection, right lower leg incision opened up 1 inch, c/o red, swollen, draining. He thinks he might have accidentally hit his right lower leg on his chair the last day of his hospitalization. Swelling and clear yellow drainage at left lower leg, medial aspect started about 1.5 weeks ago.  He denies fever or chills at home. He has some numbness in his anterior left lower leg since the surgery. Dry ulcer at tip of right great toe enlarged since the surgery, but he did sustain a mild injury by foot of overhead rolling table hitting this toe. Since the last revascularization surgery, he has had no claudication pain in right calf as he did pre-op. He has no appetite, but denies post prandial abdominal pain. He denies personal or family history of aneurysms.  Pt Diabetic: Yes Pt smoker: smoker  (1/4 ppd, started 50 years ago)  Pt meds include: Statin :No ASA: Yes Other anticoagulants/antiplatelets: took his last Plavix last night  Past Medical History  Diagnosis Date  . Chronic low back pain     "degenerative spine dx'd ~ 7 yr ago" (06/08/2013)  . COPD (chronic obstructive pulmonary disease)   . Anxiety   . Claudication   . Unexplained weight loss   . Tobacco abuse   . Hypertension     "moderately high; RX didn't help" (06/08/2013)  . PAD (peripheral artery disease), PTA/stent IDEV & chocolate baloon 06/08/13 04/13/2013    Severely reduced ABI's of 0.3 bilaterally   . Non-healing wound of lower extremity 06/09/2013  . Tobacco use 06/09/2013  . Emphysema of lung   . Shortness of breath     w/ exertion  . Neuromuscular disorder     degen spine    Social  History History  Substance Use Topics  . Smoking status: Current Every Day Smoker -- 0.50 packs/day for 50 years    Types: Cigarettes    Start date: 07/03/1963  . Smokeless tobacco: Never Used  . Alcohol Use: Yes     Comment: 06/08/2013 "quit 02/2007; drank my share before that though"    Family History Family History  Problem Relation Age of Onset  . Hyperlipidemia Mother   . Heart disease Mother   . Cancer Father     Liver    Past Surgical History  Procedure Laterality Date  . Abi      04/09/13; abnormal  . Femoral artery stent Right 05/10/13    2 stents Rt SFA  . Femoral artery stent Left 06/08/2013  . Appendectomy  05/2001  . Femoral-popliteal bypass graft Right 11/12/2013    Procedure: RIGHT  FEMORAL-BELOW KNEE POPLITEAL ARTERY BYPASS GRAFT USING NON- REVERSE RIGHT GREATER SAPHENOUS VEIN.;  Surgeon: Nada LibmanVance W Brabham, MD;  Location: [facility] OR;  Service: Vascular;  Laterality: Right;    No Known Allergies  Current Outpatient Prescriptions  Medication Sig Dispense Refill  . ALPRAZolam (XANAX) 1 MG tablet Take 1 mg by mouth 3 (three) times daily as needed for anxiety.       Marland Kitchen. aspirin EC 81 MG tablet Take 81 mg by mouth daily.      . clopidogrel (PLAVIX) 75 MG tablet Take 1 tablet (75 mg total) by mouth daily.  30 tablet  5  . Oxycodone HCl 10 MG TABS Take 10 mg by mouth every 4 (four) hours.       No current facility-administered medications for this visit.    ROS: See HPI for pertinent positives and negatives.   Physical Examination  Filed Vitals:   12/09/13 1128  BP: 125/81  Pulse: 80  Temp: 97.4 F (36.3 C)  Resp: 16  Body mass index is 19.47 kg/(m^2).  General: A&O x 3, WDWN. Gait: normal Pulmonary: CTAB, without wheezes , rales or rhonchi. Cardiac: regular Rythm , without detected murmur.         Carotid Bruits Left Right   Negative Negative  Aorta is  palpable. Radial pulses: are 2+ palpable and =                           VASCULAR EXAM: Extremities  with ischemic changes: tip of right great toe and medial edge of left great ingrown toenail are dark, dry gangrene   with open surgical wound at right lower leg, medial aspect; large amount of serous fluid expressed and swelling at this area mostly resolved.                                                                                                          LE Pulses LEFT RIGHT       FEMORAL   palpable   palpable        POPLITEAL  not palpable   not palpable       POSTERIOR TIBIAL   palpable   not palpable        DORSALIS PEDIS      ANTERIOR TIBIAL not palpable   palpable    Abdomen: soft, NT, no masses. Skin: no rashes, no ulcers noted other than noted above. Musculoskeletal: no muscle wasting or atrophy.  Neurologic: A&O X 3; Appropriate Affect ; SENSATION: normal; MOTOR FUNCTION:  moving all extremities equally, motor strength 5/5 throughout. Speech is fluent/normal. CN 2-12 grossly intact.  ASSESSMENT: Christopher Pena is a 68 y.o. male who is s/p right femoral to below the knee popliteal artery bypass graft using ipsilateral non-reversed translocated vein on 11/12/2013. He returns today for c/o incision infection, right lower leg incision opened up 1 inch, c/o red, swollen, draining. He thinks he might have accidentally hit his right lower leg on his chair the last day of his hospitalization. Swelling and clear yellow drainage at left lower leg, medial aspect started about 1.5 weeks ago.  He denies fever or chills at home. He has some numbness in his anterior left lower leg since the surgery. Dry ulcer at tip of right great toe enlarged since the surgery, but he did sustain a mild injury by foot of overhead rolling table hitting this toe. Since the last revascularization surgery, he has had no claudication pain in right calf as he did pre-op. Seroma: Large amount of dark clear yellow tinged drainage expressed from the swelling at right lower leg medial aspect incision.  No purulent  drainage, minimal redness at incision. He has not been elevating his legs. Claudication in right calf resolved since the 11/12/13 surgery.  PLAN:  Walk at least 30 minutes daily. Elevate ankles above heart whenever he is not walking. Wash right lower leg incision with soap and water twice daily, tape 3 halved 4x4 gauze dressings on incision. I discussed in depth with the patient the nature of atherosclerosis, and emphasized the importance of maximal medical management including strict control of blood pressure, blood glucose, and lipid levels, obtaining regular exercise, and cessation of smoking.  The patient is aware that without maximal medical management the underlying atherosclerotic disease process will progress, limiting the benefit of any interventions.  Based on the patient's vascular studies and examination, pt will return to clinic on Monday, 12/13/13, as scheduled with Dr. Myra GianottiBrabham.  The patient was given information about PAD including signs, symptoms, treatment, what symptoms should prompt the patient to seek immediate medical care, and risk reduction measures to take.  Charisse MarchSuzanne Tecumseh Yeagley, RN, MSN, FNP-C [facility] [place] Office Phone: [phone]  [facility]  12/09/2013 11:39 AM

## 2013-12-09 NOTE — Patient Instructions (Addendum)
Peripheral Vascular Disease Peripheral Vascular Disease (PVD), also called Peripheral Arterial Disease (PAD), is a circulation problem caused by cholesterol (atherosclerotic plaque) deposits in the arteries. PVD commonly occurs in the lower extremities (legs) but it can occur in other areas of the body, such as your arms. The cholesterol buildup in the arteries reduces blood flow which can cause pain and other serious problems. The presence of PVD can place a person at risk for Coronary Artery Disease (CAD).  CAUSES  Causes of PVD can be many. It is usually associated with more than one risk factor such as:   High Cholesterol.  Smoking.  Diabetes.  Lack of exercise or inactivity.  High blood pressure (hypertension).  Obesity.  Family history. SYMPTOMS   When the lower extremities are affected, patients with PVD may experience:  Leg pain with exertion or physical activity. This is called INTERMITTENT CLAUDICATION. This may present as cramping or numbness with physical activity. The location of the pain is associated with the level of blockage. For example, blockage at the abdominal level (distal abdominal aorta) may result in buttock or hip pain. Lower leg arterial blockage may result in calf pain.  As PVD becomes more severe, pain can develop with less physical activity.  In people with severe PVD, leg pain may occur at rest.  Other PVD signs and symptoms:  Leg numbness or weakness.  Coldness in the affected leg or foot, especially when compared to the other leg.  A change in leg color.  Patients with significant PVD are more prone to ulcers or sores on toes, feet or legs. These may take longer to heal or may reoccur. The ulcers or sores can become infected.  If signs and symptoms of PVD are ignored, gangrene may occur. This can result in the loss of toes or loss of an entire limb.  Not all leg pain is related to PVD. Other medical conditions can cause leg pain such  as:  Blood clots (embolism) or Deep Vein Thrombosis.  Inflammation of the blood vessels (vasculitis).  Spinal stenosis. DIAGNOSIS  Diagnosis of PVD can involve several different types of tests. These can include:  Pulse Volume Recording Method (PVR). This test is simple, painless and does not involve the use of X-rays. PVR involves measuring and comparing the blood pressure in the arms and legs. An ABI (Ankle-Brachial Index) is calculated. The normal ratio of blood pressures is 1. As this number becomes smaller, it indicates more severe disease.  < 0.95  indicates significant narrowing in one or more leg vessels.  <0.8 there will usually be pain in the foot, leg or buttock with exercise.  <0.4 will usually have pain in the legs at rest.  <0.25  usually indicates limb threatening PVD.  Doppler detection of pulses in the legs. This test is painless and checks to see if you have a pulses in your legs/feet.  A dye or contrast material (a substance that highlights the blood vessels so they show up on x-ray) may be given to help your caregiver better see the arteries for the following tests. The dye is eliminated from your body by the kidney's. Your caregiver may order blood work to check your kidney function and other laboratory values before the following tests are performed:  Magnetic Resonance Angiography (MRA). An MRA is a picture study of the blood vessels and arteries. The MRA machine uses a large magnet to produce images of the blood vessels.  Computed Tomography Angiography (CTA). A CTA is a   specialized x-ray that looks at how the blood flows in your blood vessels. An IV may be inserted into your arm so contrast dye can be injected.  Angiogram. Is a procedure that uses x-rays to look at your blood vessels. This procedure is minimally invasive, meaning a small incision (cut) is made in your groin. A small tube (catheter) is then inserted into the artery of your groin. The catheter is  guided to the blood vessel or artery your caregiver wants to examine. Contrast dye is injected into the catheter. X-rays are then taken of the blood vessel or artery. After the images are obtained, the catheter is taken out. TREATMENT  Treatment of PVD involves many interventions which may include:  Lifestyle changes:  Quitting smoking.  Exercise.  Following a low fat, low cholesterol diet.  Control of diabetes.  Foot care is very important to the PVD patient. Good foot care can help prevent infection.  Medication:  Cholesterol-lowering medicine.  Blood pressure medicine.  Anti-platelet drugs.  Certain medicines may reduce symptoms of Intermittent Claudication.  Interventional/Surgical options:  Angioplasty. An Angioplasty is a procedure that inflates a balloon in the blocked artery. This opens the blocked artery to improve blood flow.  Stent Implant. A wire mesh tube (stent) is placed in the artery. The stent expands and stays in place, allowing the artery to remain open.  Peripheral Bypass Surgery. This is a surgical procedure that reroutes the blood around a blocked artery to help improve blood flow. This type of procedure may be performed if Angioplasty or stent implants are not an option. SEEK IMMEDIATE MEDICAL CARE IF:   You develop pain or numbness in your arms or legs.  Your arm or leg turns cold, becomes blue in color.  You develop redness, warmth, swelling and pain in your arms or legs. MAKE SURE YOU:   Understand these instructions.  Will watch your condition.  Will get help right away if you are not doing well or get worse. Document Released: 09/12/2004 Document Revised: 10/28/2011 Document Reviewed: 08/09/2008 ExitCare Patient Information 2014 [employer].   Smoking Cessation Quitting smoking is important to your health and has many advantages. However, it is not always easy to quit since nicotine is a very addictive drug. Often times, people try 3  times or more before being able to quit. This document explains the best ways for you to prepare to quit smoking. Quitting takes hard work and a lot of effort, but you can do it. ADVANTAGES OF QUITTING SMOKING  You will live longer, feel better, and live better.  Your body will feel the impact of quitting smoking almost immediately.  Within 20 minutes, blood pressure decreases. Your pulse returns to its normal level.  After 8 hours, carbon monoxide levels in the blood return to normal. Your oxygen level increases.  After 24 hours, the chance of having a heart attack starts to decrease. Your breath, hair, and body stop smelling like smoke.  After 48 hours, damaged nerve endings begin to recover. Your sense of taste and smell improve.  After 72 hours, the body is virtually free of nicotine. Your bronchial tubes relax and breathing becomes easier.  After 2 to 12 weeks, lungs can hold more air. Exercise becomes easier and circulation improves.  The risk of having a heart attack, stroke, cancer, or lung disease is greatly reduced.  After 1 year, the risk of coronary heart disease is cut in half.  After 5 years, the risk of stroke falls to   the same as a nonsmoker.  After 10 years, the risk of lung cancer is cut in half and the risk of other cancers decreases significantly.  After 15 years, the risk of coronary heart disease drops, usually to the level of a nonsmoker.  If you are pregnant, quitting smoking will improve your chances of having a healthy baby.  The people you live with, especially any children, will be healthier.  You will have extra money to spend on things other than cigarettes. QUESTIONS TO THINK ABOUT BEFORE ATTEMPTING TO QUIT You may want to talk about your answers with your caregiver.  Why do you want to quit?  If you tried to quit in the past, what helped and what did not?  What will be the most difficult situations for you after you quit? How will you plan to  handle them?  Who can help you through the tough times? Your family? Friends? A caregiver?  What pleasures do you get from smoking? What ways can you still get pleasure if you quit? Here are some questions to ask your caregiver:  How can you help me to be successful at quitting?  What medicine do you think would be best for me and how should I take it?  What should I do if I need more help?  What is smoking withdrawal like? How can I get information on withdrawal? GET READY  Set a quit date.  Change your environment by getting rid of all cigarettes, ashtrays, matches, and lighters in your home, car, or work. Do not let people smoke in your home.  Review your past attempts to quit. Think about what worked and what did not. GET SUPPORT AND ENCOURAGEMENT You have a better chance of being successful if you have help. You can get support in many ways.  Tell your family, friends, and co-workers that you are going to quit and need their support. Ask them not to smoke around you.  Get individual, group, or telephone counseling and support. Programs are available at local hospitals and health centers. Call your local health department for information about programs in your area.  Spiritual beliefs and practices may help some smokers quit.  Download a "quit meter" on your computer to keep track of quit statistics, such as how long you have gone without smoking, cigarettes not smoked, and money saved.  Get a self-help book about quitting smoking and staying off of tobacco. LEARN NEW SKILLS AND BEHAVIORS  Distract yourself from urges to smoke. Talk to someone, go for a walk, or occupy your time with a task.  Change your normal routine. Take a different route to work. Drink tea instead of coffee. Eat breakfast in a different place.  Reduce your stress. Take a hot bath, exercise, or read a book.  Plan something enjoyable to do every day. Reward yourself for not smoking.  Explore  interactive web-based programs that specialize in helping you quit. GET MEDICINE AND USE IT CORRECTLY Medicines can help you stop smoking and decrease the urge to smoke. Combining medicine with the above behavioral methods and support can greatly increase your chances of successfully quitting smoking.  Nicotine replacement therapy helps deliver nicotine to your body without the negative effects and risks of smoking. Nicotine replacement therapy includes nicotine gum, lozenges, inhalers, nasal sprays, and skin patches. Some may be available over-the-counter and others require a prescription.  Antidepressant medicine helps people abstain from smoking, but how this works is unknown. This medicine is available by prescription.    Nicotinic receptor partial agonist medicine simulates the effect of nicotine in your brain. This medicine is available by prescription. Ask your caregiver for advice about which medicines to use and how to use them based on your health history. Your caregiver will tell you what side effects to look out for if you choose to be on a medicine or therapy. Carefully read the information on the package. Do not use any other product containing nicotine while using a nicotine replacement product.  RELAPSE OR DIFFICULT SITUATIONS Most relapses occur within the first 3 months after quitting. Do not be discouraged if you start smoking again. Remember, most people try several times before finally quitting. You may have symptoms of withdrawal because your body is used to nicotine. You may crave cigarettes, be irritable, feel very hungry, cough often, get headaches, or have difficulty concentrating. The withdrawal symptoms are only temporary. They are strongest when you first quit, but they will go away within 10 14 days. To reduce the chances of relapse, try to:  Avoid drinking alcohol. Drinking lowers your chances of successfully quitting.  Reduce the amount of caffeine you consume. Once you  quit smoking, the amount of caffeine in your body increases and can give you symptoms, such as a rapid heartbeat, sweating, and anxiety.  Avoid smokers because they can make you want to smoke.  Do not let weight gain distract you. Many smokers will gain weight when they quit, usually less than 10 pounds. Eat a healthy diet and stay active. You can always lose the weight gained after you quit.  Find ways to improve your mood other than smoking. FOR MORE INFORMATION  www.smokefree.gov  Document Released: 07/30/2001 Document Revised: 02/04/2012 Document Reviewed: 11/14/2011 ExitCare Patient Information 2014 [employer].  

## 2013-12-10 ENCOUNTER — Encounter: Payer: Self-pay | Admitting: Family

## 2013-12-10 ENCOUNTER — Telehealth: Payer: Self-pay

## 2013-12-10 ENCOUNTER — Encounter: Payer: Self-pay | Admitting: Surgery

## 2013-12-10 NOTE — Telephone Encounter (Signed)
Phone call from pt. reported that approx. 2 hrs after his appt. yesterday with NP, he experienced "extreme pain behind his knee."  Stated he started having chills shortly after the pain in post. Knee; stated this was @ approx. 2:30 PM. Reported that at about 4:30 PM, his face and neck felt hot.  Reported at 9:00 PM, the chills and fever subsided.  Reported his thermometer did not work, but his symptoms improved.  Reported today he just feels like he has a mild case of the flu; c/o feeling achy, mild H/A, and feeling zapped."  Questioned about his right lower leg incision.  Reported there is some redness around the lower leg incision, but "not significant".  Reported the bandage that was placed yesterday, is saturated with drainage; describes as clear yellow with blood-tinge.  Will discuss with S. Nickel, NP.

## 2013-12-10 NOTE — Telephone Encounter (Signed)
Discussed pt's symptoms with Dr. Arbie CookeyEarly.  Advised pt. should monitor temperature, monitor incisional area for worsening symptoms, rest over the weekend, and see Dr. Myra GianottiBrabham on 12/13/13; also advised to check CBC prior to appt. on Monday.  Called pt. with above recommendations.  Stated he has pain at the top of the calf area, and not actually behind the knee.   Reports the pain decreases if he keeps his leg straight, and also improves if he points his right foot out to the side.  Denies any swelling of the right lower extremity.  Advised pt. to go to the ER of symptoms worsen, over the weekend.  Encouraged to get a thermometer, and monitor his temperature over the weekend.  Advised that Dr. Arbie CookeyEarly recommended to have pt. get CBC prior to his appt. on 4/27.   In[facility]ed [facility]  Stated he didn't want to do that until he sees Dr. Myra GianottiBrabham.  [place]. Pt. That the blood work would indicate if he had an infection.  Pt. Insisted he wanted to wait until he sees Dr. Myra GianottiBrabham.  Encouraged to keep the incision clean /dry to reduce chance of infection.  Pt. Verb. Understanding of instructions.

## 2013-12-13 ENCOUNTER — Encounter: Payer: Self-pay | Admitting: Surgery

## 2013-12-13 ENCOUNTER — Ambulatory Visit (INDEPENDENT_AMBULATORY_CARE_PROVIDER_SITE_OTHER): Payer: Self-pay | Admitting: Surgery

## 2013-12-13 VITALS — BP 129/85 | HR 109 | Temp 98.0°F | Ht 68.0 in | Wt 125.0 lb

## 2013-12-13 DIAGNOSIS — Z4889 Encounter for other specified surgical aftercare: Secondary | ICD-10-CM

## 2013-12-13 DIAGNOSIS — I739 Peripheral vascular disease, unspecified: Secondary | ICD-10-CM

## 2013-12-13 NOTE — Progress Notes (Signed)
The patient is back today for a wound check.  On 11/12/2013, he underwent a right femoral to below knee popliteal artery bypass graft using ipsilateral non-reversed translocated vein.  The proximal anastomosis was to the profundofemoral artery, given the length of the vein.  He had what sounds like a seroma drained in clinic a week ago.  After this he had pain and fevers.  These resolved.  He is back today for further evaluation.  There is an area about 2 cm in his below knee incision which has not completely healed.  I probed this area with a Q-tip and opened up into a seroma cavity.  This tracked approximately 1 cm cephalad.  The fascia was intact below.  I packed it with a 4 x 4.  The patient had excellent multiphasic Doppler signals in his anterior tibial and posterior tibial artery as well as what I believe to be his bypass graft and the distal thigh.  I am going to have the patient set up for home health.  He will do wet-to-dry dressing changes twice daily.  I have scheduled him for followup in 3-4 weeks.  I saw no evidence of infection today.  Durene CalWells Brabham

## 2013-12-14 ENCOUNTER — Ambulatory Visit: Payer: Medicare Other

## 2013-12-14 DIAGNOSIS — I739 Peripheral vascular disease, unspecified: Secondary | ICD-10-CM

## 2013-12-14 NOTE — Progress Notes (Signed)
Patient called this am asking about the status of home health. I [facility]  Wednesday, 12/15/13.  I asked him if he could come to office and I would do his dressing change today.  He came in and I removed the 4x4 from the incision on his right lower leg.  I cleansed the wound and then packed a 4x4 gauze in the wound moistened with normal saline.  I covered with two dry folded 4x4's. I instructed patient on doing the dressing change and gave him a few supplies in the event home health is late coming to see him. He is to call and come in for any worsening symptoms. The patient voiced understanding of these instructions.

## 2013-12-31 ENCOUNTER — Encounter: Payer: Self-pay | Admitting: Surgery

## 2014-01-03 ENCOUNTER — Encounter: Payer: Self-pay | Admitting: Surgery

## 2014-01-03 ENCOUNTER — Ambulatory Visit (INDEPENDENT_AMBULATORY_CARE_PROVIDER_SITE_OTHER): Payer: Self-pay | Admitting: Surgery

## 2014-01-03 ENCOUNTER — Telehealth: Payer: Self-pay | Admitting: Surgery

## 2014-01-03 VITALS — BP 132/77 | HR 69 | Ht 68.0 in | Wt 127.8 lb

## 2014-01-03 DIAGNOSIS — I739 Peripheral vascular disease, unspecified: Secondary | ICD-10-CM

## 2014-01-03 DIAGNOSIS — L24A9 Irritant contact dermatitis due friction or contact with other specified body fluids: Secondary | ICD-10-CM

## 2014-01-03 DIAGNOSIS — T148XXA Other injury of unspecified body region, initial encounter: Secondary | ICD-10-CM

## 2014-01-03 NOTE — Telephone Encounter (Signed)
l/m informing patient of fu appt. with dr. Allyson Sabalberry on 01-11-14   at 9:45

## 2014-01-03 NOTE — Progress Notes (Signed)
The patient is back today for a wound check.  On 11/12/2013, he underwent a right femoral to below knee popliteal artery bypass graft with ipsilateral non-reversed translocated vein.  The proximal anastomosis is on the profunda femoral artery secondary to the length of vein.  I have been following for a area on the below knee incision which has been slow to heal.  There has been significant interval change.  His wound is now very superficial.  He'll continue with wet to dry dressing changes.  I'll see him back in 6 weeks for a wound check.  The patient is complaining about discoloration and some claudication symptoms in his left leg.  I have referred him back to Dr. Allyson SabalBerry who has performed percutaneous intervention previously.

## 2014-01-11 ENCOUNTER — Ambulatory Visit (INDEPENDENT_AMBULATORY_CARE_PROVIDER_SITE_OTHER): Payer: Medicare Other | Admitting: Cardiovascular Disease

## 2014-01-11 ENCOUNTER — Encounter: Payer: Self-pay | Admitting: Cardiovascular Disease

## 2014-01-11 VITALS — BP 114/71 | HR 91 | Ht 68.0 in | Wt 127.5 lb

## 2014-01-11 DIAGNOSIS — Z720 Tobacco use: Secondary | ICD-10-CM

## 2014-01-11 DIAGNOSIS — F17200 Nicotine dependence, unspecified, uncomplicated: Secondary | ICD-10-CM

## 2014-01-11 DIAGNOSIS — I739 Peripheral vascular disease, unspecified: Secondary | ICD-10-CM

## 2014-01-11 MED ORDER — CLOPIDOGREL BISULFATE 75 MG PO TABS: 75.0000 mg | ORAL_TABLET | Freq: Every day | ORAL | Status: DC

## 2014-01-11 NOTE — Patient Instructions (Signed)
  We will see you back in follow up in 3 months with Dr Allyson Sabal.   Dr Allyson Sabal has ordered lower extremity arterial doppler- During this test, ultrasound is used to evaluate arterial blood flow in the legs. Allow approximately one hour for this exam.   Start back on your Plavix (clopidogrel) 75mg  daily

## 2014-01-11 NOTE — Assessment & Plan Note (Addendum)
Recalcitrant to risk factor modification. We offered him a prescription for Chantix

## 2014-01-11 NOTE — Assessment & Plan Note (Signed)
History of peripheral arterial disease with critical limb ischemia on both legs. I have a staged fashion percutaneously revascularize his right and left SFA last year. He ultimately occluded his right SFA probably thrombotic lady from continued tobacco abuse and not taking his Plavix. He underwent right profunda femoris to popliteal bypass grafting by Dr. Myra Gianotti with improvement in his symptoms. He now complains of some left lower extremity claudication. He does continue to smoke. He is currently on Plavix. Good repeat lower extremity arterial Doppler studies.

## 2014-01-11 NOTE — Progress Notes (Signed)
01/11/2014 Christopher Pena   [birth year]  [id number]  Primary Physician No PCP Per Patient Primary Cardiologist: Runell Gess MD [name]   HPI:  Christopher Pena is a 68 year old thin and cachectic appearing divorced Caucasian male father of 2, grandfather 79 grandchildren is a retired Customer service manager. He is referred by Dr. Rennis Golden for cardiovascular evaluation. Factors include 50-100-pack-year history of tobacco use having smoked one pack a day recently and tried to stop the cigarettes. He does have hypertension. He's never had a heart attack or stroke. He does of COPD. He complained of several months of lifestyle limiting claudication which has gotten worse now unable to walk more than 20 yards. He does have resting pain at night. Dopplers performed in office on 04/16/13 revealed a right ABI 0.23 and left appropriate suggesting critical limb ischemia. A subsequent Myoview stress test was low risk. On 05/10/13 a performed r angiography demonstrating occluded SFAs bilaterally. I recanalize his right SFA which resulted in improvement in his right lower [name] location and healing of his wound. He continued to have left lower extremity claudication and nonhealing wound.I performed percutaneous opacification of his left lower extremity on 06/08/13 using IDEV self expanding stents.his followup arterial Doppler studies performed 06/25/13 were completely normal. His pain had resolved and his wounds were healing. He does continue to smoke however.   I saw him in the office on 10/26/13 at which time he had fairly sudden onset of right lower extremity discomfort consistent with critical limb ischemia. I angiogram him 2 days later revealing what appeared to be a thrombotic occluded right SFA. His anatomy did not appear to be percutaneously revascularizable and therefore he underwent right profunda femoris to popliteal bypass grafting by Dr. Myra Gianotti  on 327 using ipsilateral lateral non-reversed  translocated vein. The symptoms have slowly resolved. He has seen Dr. Myra Gianotti back in the office for followup. He does complain of new left lower extremity claudication.    Current Outpatient Prescriptions  Medication Sig Dispense Refill  . ALPRAZolam (XANAX) 1 MG tablet Take 1 mg by mouth 3 (three) times daily as needed for anxiety.       Marland Kitchen aspirin EC 81 MG tablet Take 81 mg by mouth daily.      . Oxycodone HCl 10 MG TABS Take 10 mg by mouth every 4 (four) hours.      . clopidogrel (PLAVIX) 75 MG tablet Take 1 tablet (75 mg total) by mouth daily.  30 tablet  5   No current facility-administered medications for this visit.    No Known Allergies  History   Social History  . Marital Status: Divorced    Spouse Name: N/A    Number of Children: N/A  . Years of Education: N/A   Occupational History  . former golf pro    Social History Main Topics  . Smoking status: Current Every Day Smoker -- 0.50 packs/day for 50 years    Types: Cigarettes    Start date: 07/03/1963  . Smokeless tobacco: Never Used  . Alcohol Use: Yes     Comment: 06/08/2013 "quit 02/2007; drank my share before that though"  . Drug Use: No  . Sexual Activity: Yes   Other Topics Concern  . Not on file   Social History Narrative  . No narrative on file     Review of Systems: General: negative for chills, fever, night sweats or weight changes.  Cardiovascular: negative for chest pain, dyspnea on exertion, edema, orthopnea, palpitations,  paroxysmal nocturnal dyspnea or shortness of breath Dermatological: negative for rash Respiratory: negative for cough or wheezing Urologic: negative for hematuria Abdominal: negative for nausea, vomiting, diarrhea, bright red blood per rectum, melena, or hematemesis Neurologic: negative for visual changes, syncope, or dizziness All other systems reviewed and are otherwise negative except as noted above.    Blood pressure 114/71, pulse 91, height 5\' 8"  (1.727 m), weight  127 lb 8 oz (57.834 kg).  General appearance: alert and no distress Neck: no adenopathy, no carotid bruit, no JVD, supple, symmetrical, trachea midline and thyroid not enlarged, symmetric, no tenderness/mass/nodules Lungs: clear to auscultation bilaterally Heart: regular rate and rhythm, S1, S2 normal, no murmur, click, rub or gallop Extremities: his right femoral incision is healing well. He has bilateral palpable pedal pulses  EKG not performed today  ASSESSMENT AND PLAN:   PAD (peripheral artery disease), PTA/stent IDEV & chocolate baloon 06/08/13, Previous PTA/Stent to Rt SFA 05/10/13 History of peripheral arterial disease with critical limb ischemia on both legs. I have a staged fashion percutaneously revascularize his right and left SFA last year. He ultimately occluded his right SFA probably thrombotic lady from continued tobacco abuse and not taking his Plavix. He underwent right profunda femoris to popliteal bypass grafting by Dr. Myra GianottiBrabham with improvement in his symptoms. He now complains of some left lower extremity claudication. He does continue to smoke. He is currently on Plavix. Good repeat lower extremity arterial Doppler studies.  Tobacco use Recalcitrant to risk factor modification. We offered him a prescription for Chantix       Runell GessJonathan J. Berry MD [facility] LLCFACP,FACC,FAHA, [facility]SCAI 01/11/2014 10:49 AM

## 2014-01-13 ENCOUNTER — Other Ambulatory Visit (HOSPITAL_COMMUNITY): Payer: Self-pay | Admitting: Cardiovascular Disease

## 2014-01-13 ENCOUNTER — Ambulatory Visit (HOSPITAL_COMMUNITY)
Admission: RE | Admit: 2014-01-13 | Discharge: 2014-01-13 | Disposition: A | Discharge status: Home / Self Care | Payer: Medicare Other | Source: Ambulatory Visit | Attending: Cardiovascular Disease | Admitting: Cardiovascular Disease

## 2014-01-13 DIAGNOSIS — I739 Peripheral vascular disease, unspecified: Secondary | ICD-10-CM

## 2014-01-13 NOTE — Progress Notes (Signed)
Lower Extremity Arterial Duplex Completed. °Brianna L Mazza,RVT °

## 2014-01-19 ENCOUNTER — Other Ambulatory Visit: Payer: Self-pay | Admitting: *Deleted

## 2014-01-19 DIAGNOSIS — I739 Peripheral vascular disease, unspecified: Secondary | ICD-10-CM

## 2014-02-17 ENCOUNTER — Encounter: Payer: Self-pay | Admitting: Surgery

## 2014-02-21 ENCOUNTER — Ambulatory Visit (INDEPENDENT_AMBULATORY_CARE_PROVIDER_SITE_OTHER): Payer: Medicare Other | Admitting: Surgery

## 2014-02-21 ENCOUNTER — Encounter: Payer: Self-pay | Admitting: Surgery

## 2014-02-21 VITALS — BP 128/74 | HR 85 | Temp 97.9°F | Resp 24 | Ht 68.0 in | Wt 132.2 lb

## 2014-02-21 DIAGNOSIS — I739 Peripheral vascular disease, unspecified: Secondary | ICD-10-CM

## 2014-02-21 DIAGNOSIS — Z5189 Encounter for other specified aftercare: Secondary | ICD-10-CM

## 2014-02-21 NOTE — Progress Notes (Signed)
Patient name: Felicita GageDavid H Batalla MRN: [id number] DOB: 04/03/1946 Sex: male     Chief Complaint  Patient presents with  . Wound Check    hx Right Fem-BK Pop BP 11/12/13    HISTORY OF PRESENT ILLNESS: On 11/12/2013, he underwent a right femoral to below knee popliteal artery bypass graft using ipsilateral non-reversed translocated vein. The proximal anastomosis was to the profundofemoral artery, given the length of the vein.  The patient had a slight separation in his below knee incision which has been undergoing dressing changes.  He states that the claudication symptoms in his left leg have improved since starting back on his Plavix.   Past Medical History  Diagnosis Date  . Chronic low back pain     "degenerative spine dx'd ~ 7 yr ago" (06/08/2013)  . COPD (chronic obstructive pulmonary disease)   . Anxiety   . Claudication   . Unexplained weight loss   . Tobacco abuse   . Hypertension     "moderately high; RX didn't help" (06/08/2013)  . PAD (peripheral artery disease), PTA/stent IDEV & chocolate baloon 06/08/13 04/13/2013    Severely reduced ABI's of 0.3 bilaterally   . Non-healing wound of lower extremity 06/09/2013  . Tobacco use 06/09/2013  . Emphysema of lung   . Shortness of breath     w/ exertion  . Neuromuscular disorder     degen spine    Past Surgical History  Procedure Laterality Date  . Abi      04/09/13; abnormal  . Femoral artery stent Right 05/10/13    2 stents Rt SFA  . Femoral artery stent Left 06/08/2013  . Appendectomy  05/2001  . Femoral-popliteal bypass graft Right 11/12/2013    Procedure: RIGHT  FEMORAL-BELOW KNEE POPLITEAL ARTERY BYPASS GRAFT USING NON- REVERSE RIGHT GREATER SAPHENOUS VEIN.;  Surgeon: Nada LibmanVance W Turner Kunzman, MD;  Location: [facility] OR;  Service: Vascular;  Laterality: Right;    History   Social History  . Marital Status: Divorced    Spouse Name: N/A    Number of Children: N/A  . Years of Education: N/A   Occupational History  . former  golf pro    Social History Main Topics  . Smoking status: Current Every Day Smoker -- 0.50 packs/day for 50 years    Types: Cigarettes    Start date: 07/03/1963  . Smokeless tobacco: Never Used     Comment: states "trying to quit"  . Alcohol Use: No     Comment: 06/08/2013 "quit 02/2007; drank my share before that though"  . Drug Use: No  . Sexual Activity: Yes   Other Topics Concern  . Not on file   Social History Narrative  . No narrative on file    Family History  Problem Relation Age of Onset  . Hyperlipidemia Mother   . Heart disease Mother   . Cancer Father     Liver    Allergies as of 02/21/2014  . (No Known Allergies)    Current Outpatient Prescriptions on File Prior to Visit  Medication Sig Dispense Refill  . ALPRAZolam (XANAX) 1 MG tablet Take 1 mg by mouth 3 (three) times daily as needed for anxiety.       Marland Kitchen. aspirin EC 81 MG tablet Take 81 mg by mouth daily.      . clopidogrel (PLAVIX) 75 MG tablet Take 1 tablet (75 mg total) by mouth daily.  30 tablet  5  . Oxycodone HCl 10 MG TABS Take  10 mg by mouth every 4 (four) hours.       No current facility-administered medications on file prior to visit.     REVIEW OF SYSTEMS: Cardiovascular: No chest pain, chest pressure, palpitations, orthopnea, or dyspnea on exertion.  Pulmonary: No productive cough, asthma or wheezing. Neurologic: No weakness, paresthesias, aphasia, or amaurosis. No dizziness. Hematologic: No bleeding problems or clotting disorders. Musculoskeletal: No joint pain or joint swelling. Gastrointestinal: No blood in stool or hematemesis Genitourinary: No dysuria or hematuria. Psychiatric:: No history of major depression. Integumentary: Small opening on right below knee incision Constitutional: No fever or chills.  PHYSICAL EXAMINATION:   Vital signs are BP 128/74  Pulse 85  Temp(Src) 97.9 F (36.6 C)  Resp 24  Ht 5\' 8"  (1.727 m)  Wt 132 lb 3.2 oz (59.966 kg)  BMI 20.11 kg/m2 General:  The patient appears their stated age. HEENT:  No gross abnormalities Pulmonary:  Non labored breathing Musculoskeletal: There are no major deformities. Neurologic: No focal weakness or paresthesias are detected, Skin: Small 2 mm by less than 1 mm opening at the proximal aspect of the below knee incision on the right.  This was cauterized with silver nitrate. Psychiatric: The patient has normal affect. Cardiovascular: There is a regular rate and rhythm without significant murmur appreciated.   Diagnostic Studies I have reviewed his Doppler studies from Dr. Benay SpiceBarry's office which reveals a widely patent bypass graft, however there were velocity measurements in the high 30s.  Assessment: Status post right femoral-popliteal bypass graft Plan: From my perspective the patient is doing well.  I suspect that his wound will continue to heal and the resolve within the next one month.  His ultrasound is bypass graft shows a patent bypass, however there were velocities in the high 30s.  His imaging surveillance is going to be done with Dr. Allyson SabalBerry.  I strongly recommend repeating the ultrasound of his bypass graft in 6 months.  I am transferring care of his peripheral vascular disease back to Dr. Gery PrayBarry.  The patient will contact me if any surgical issues arise  V. Charlena CrossWells Dustan Hyams IV, M.D. [facility] [place] Office: [phone] Pager:  [phone]  e

## 2014-04-19 ENCOUNTER — Ambulatory Visit (INDEPENDENT_AMBULATORY_CARE_PROVIDER_SITE_OTHER): Payer: Medicare Other | Admitting: Cardiovascular Disease

## 2014-04-19 ENCOUNTER — Encounter (HOSPITAL_COMMUNITY): Payer: Self-pay | Admitting: *Deleted

## 2014-04-19 ENCOUNTER — Encounter: Payer: Self-pay | Admitting: Cardiovascular Disease

## 2014-04-19 VITALS — BP 122/66 | HR 84 | Ht 68.0 in | Wt 130.0 lb

## 2014-04-19 DIAGNOSIS — R5381 Other malaise: Secondary | ICD-10-CM

## 2014-04-19 DIAGNOSIS — R350 Frequency of micturition: Secondary | ICD-10-CM

## 2014-04-19 DIAGNOSIS — Z79899 Other long term (current) drug therapy: Secondary | ICD-10-CM

## 2014-04-19 DIAGNOSIS — E785 Hyperlipidemia, unspecified: Secondary | ICD-10-CM

## 2014-04-19 DIAGNOSIS — R5383 Other fatigue: Secondary | ICD-10-CM

## 2014-04-19 DIAGNOSIS — I10 Essential (primary) hypertension: Secondary | ICD-10-CM

## 2014-04-19 DIAGNOSIS — I739 Peripheral vascular disease, unspecified: Secondary | ICD-10-CM

## 2014-04-19 LAB — CBC
HEMATOCRIT: 42.7 % (ref 39.0–52.0)
Hemoglobin: 15.2 g/dL (ref 13.0–17.0)
MCH: 31.5 pg (ref 26.0–34.0)
MCHC: 35.6 g/dL (ref 30.0–36.0)
MCV: 88.4 fL (ref 78.0–100.0)
PLATELETS: 261 10*3/uL (ref 150–400)
RBC: 4.83 MIL/uL (ref 4.22–5.81)
RDW: 14.8 % (ref 11.5–15.5)
WBC: 10.7 10*3/uL — AB (ref 4.0–10.5)

## 2014-04-19 NOTE — Patient Instructions (Signed)
  We will see you back in follow up with Dr Allyson Sabal after the doppler.   Dr Allyson Sabal has ordered: 1. lower extremity arterial doppler (of the left leg)- During this test, ultrasound is used to evaluate arterial blood flow in the legs. Allow approximately one hour for this exam.   2. Dr Allyson Sabal has ordered for you to have some blood work done. fasting

## 2014-04-19 NOTE — Progress Notes (Signed)
04/19/2014 Christopher Pena Christopher Pena   1946-01-18  [id number]  Primary Physician No PCP Per Patient Primary Cardiologist: Runell Gess MD [name]   HPI:  [name] is a 68 year old thin and cachectic appearing divorced Caucasian male father of 2, grandfather 74 grandchildren is a retired Customer service manager. He is referred by Dr. Rennis Golden for cardiovascular evaluation. Factors include 50-100-pack-year history of tobacco use having smoked one pack a day recently and tried to stop the cigarettes. He does have hypertension. He's never had a heart attack or stroke. He does of COPD. He complained of several months of lifestyle limiting claudication which has gotten worse now unable to walk more than 20 yards. He does have resting pain at night. Dopplers performed in office on 04/16/13 revealed a right ABI 0.23 and left appropriate suggesting critical limb ischemia. A subsequent Myoview stress test was low risk. On 05/10/13 a performed r angiography demonstrating occluded SFAs bilaterally. I recanalize his right SFA which resulted in improvement in his right lower [name] location and healing of his wound. He continued to have left lower extremity claudication and nonhealing wound.I performed percutaneous opacification of his left lower extremity on 06/08/13 using IDEV self expanding stents.his followup arterial Doppler studies performed 06/25/13 were completely normal. His pain had resolved and his wounds were healing. He does continue to smoke however.  I saw him in the office on 10/26/13 at which time he had fairly sudden onset of right lower extremity discomfort consistent with critical limb ischemia. I angiogram him 2 days later revealing what appeared to be a thrombotic occluded right SFA. His anatomy did not appear to be percutaneously revascularizable and therefore he underwent right profunda femoris to popliteal bypass grafting by Dr. Myra Gianotti on 11/12/13 using ipsilateral lateral non-reversed  translocated vein. The symptoms have slowly resolved. He has seen Dr. Myra Gianotti back in the office for followup. He does complain of new left lower extremity claudication. Lower extremity arterial Doppler studies performed in our office on 12/2513 revealed a right ABI of 1.1 with a patent right femoropopliteal bypass graft a left ABI of 0.9 with moderate left popliteal artery disease.    Current Outpatient Prescriptions  Medication Sig Dispense Refill  . ALPRAZolam (XANAX) 1 MG tablet Take 1 mg by mouth 3 (three) times daily as needed for anxiety.       Marland Kitchen aspirin EC 81 MG tablet Take 81 mg by mouth daily.      . clopidogrel (PLAVIX) 75 MG tablet Take 1 tablet (75 mg total) by mouth daily.  30 tablet  5  . Oxycodone HCl 10 MG TABS Take 10 mg by mouth every 4 (four) hours.       No current facility-administered medications for this visit.    No Known Allergies  History   Social History  . Marital Status: Divorced    Spouse Name: N/A    Number of Children: N/A  . Years of Education: N/A   Occupational History  . former golf pro    Social History Main Topics  . Smoking status: Current Every Day Smoker -- 0.50 packs/day for 50 years    Types: Cigarettes    Start date: 07/03/1963  . Smokeless tobacco: Never Used     Comment: states "trying to quit"  . Alcohol Use: No     Comment: 06/08/2013 "quit 02/2007; drank my share before that though"  . Drug Use: No  . Sexual Activity: Yes   Other Topics Concern  . Not  on file   Social History Narrative  . No narrative on file     Review of Systems: General: negative for chills, fever, night sweats or weight changes.  Cardiovascular: negative for chest pain, dyspnea on exertion, edema, orthopnea, palpitations, paroxysmal nocturnal dyspnea or shortness of breath Dermatological: negative for rash Respiratory: negative for cough or wheezing Urologic: negative for hematuria Abdominal: negative for nausea, vomiting, diarrhea, bright red  blood per rectum, melena, or hematemesis Neurologic: negative for visual changes, syncope, or dizziness All other systems reviewed and are otherwise negative except as noted above.    Blood pressure 122/66, pulse 84, height  (1.727 m), weight 130 lb (58.968 kg).  General appearance: alert and no distress Neck: no adenopathy, no carotid bruit, no JVD, supple, symmetrical, trachea midline and thyroid not enlarged, symmetric, no tenderness/mass/nodules Lungs: clear to auscultation bilaterally Heart: regular rate and rhythm, S1, S2 normal, no murmur, click, rub or gallop Extremities: extremities normal, atraumatic, no cyanosis or edema and 1+ pedal pulses bilaterally  EKG normal sinus rhythm at 83 without ST or T wave changes  ASSESSMENT AND PLAN:   Tobacco use Significantly reduced, nonsmoking e-cigarettes  PAD (peripheral artery disease), PTA/stent IDEV & chocolate baloon 06/08/13, Previous PTA/Stent to Rt SFA 05/10/13 History of right SFA intervention with subsequent occlusion of the right requiring femoropopliteal bypass grafting by Dr. Myra Gianotti 11/12/13  with using ipsilateral non-reversed translocated vein.followup lower extremity arterial Doppler studies in our office 01/13/14 revealed a patent femoropopliteal bypass graft with an ABI of 1.1 on that side. His left-sided ABI was 0.9 with a mild to moderate stenosis in the popliteal area. He does complain of some left calf claudication.      Runell Gess MD FACP,FACC,FAHA, [facility] 04/19/2014 12:01 PM

## 2014-04-19 NOTE — Assessment & Plan Note (Signed)
Significantly reduced, nonsmoking e-cigarettes

## 2014-04-19 NOTE — Assessment & Plan Note (Signed)
History of right SFA intervention with subsequent occlusion of the right requiring femoropopliteal bypass grafting by Dr. Myra Gianotti 11/12/13  with using ipsilateral non-reversed translocated vein.followup lower extremity arterial Doppler studies in our office 01/13/14 revealed a patent femoropopliteal bypass graft with an ABI of 1.1 on that side. His left-sided ABI was 0.9 with a mild to moderate stenosis in the popliteal area. He does complain of some left calf claudication.

## 2014-04-20 LAB — COMPLETE METABOLIC PANEL WITH GFR
ALBUMIN: 4.4 g/dL (ref 3.5–5.2)
ALT: <8 U/L (ref 0–53)
AST: 14 U/L (ref 0–37)
Alkaline Phosphatase: 55 U/L (ref 39–117)
BUN: 15 mg/dL (ref 6–23)
CO2: 27 mEq/L (ref 19–32)
Calcium: 9.4 mg/dL (ref 8.4–10.5)
Chloride: 101 mEq/L (ref 96–112)
Creat: 0.71 mg/dL (ref 0.50–1.35)
GFR, Est African American: >89 mL/min
GFR, Est Non African American: >89 mL/min
Glucose, Bld: 101 mg/dL — ABNORMAL HIGH (ref 70–99)
POTASSIUM: 4.4 meq/L (ref 3.5–5.3)
SODIUM: 139 meq/L (ref 135–145)
Total Bilirubin: 0.4 mg/dL (ref 0.2–1.2)
Total Protein: 7.5 g/dL (ref 6.0–8.3)

## 2014-04-20 LAB — LIPID PANEL
CHOLESTEROL: 164 mg/dL (ref 0–200)
HDL: 56 mg/dL (ref >39)
LDL Cholesterol: 88 mg/dL (ref 0–99)
Total CHOL/HDL Ratio: 2.9 Ratio
Triglycerides: 101 mg/dL (ref <150)
VLDL: 20 mg/dL (ref 0–40)

## 2014-04-20 LAB — PSA: PSA: 0.57 ng/mL (ref <=4.00)

## 2014-04-20 LAB — TSH: TSH: 1.079 u[IU]/mL (ref 0.350–4.500)

## 2014-04-21 ENCOUNTER — Ambulatory Visit (HOSPITAL_COMMUNITY)
Admission: RE | Admit: 2014-04-21 | Discharge: 2014-04-21 | Disposition: A | Discharge status: Home / Self Care | Payer: Medicare Other | Source: Ambulatory Visit | Attending: Internal Medicine | Admitting: Internal Medicine

## 2014-04-21 DIAGNOSIS — I739 Peripheral vascular disease, unspecified: Secondary | ICD-10-CM | POA: Diagnosis not present

## 2014-04-21 DIAGNOSIS — I70219 Atherosclerosis of native arteries of extremities with intermittent claudication, unspecified extremity: Secondary | ICD-10-CM

## 2014-04-21 NOTE — Progress Notes (Signed)
Left Lower Ext. Arterial Duplex Completed.  Positive for an occluded SFA and popliteal artery stent with ABI of 0.34. Marilynne Halsted, BS, RDMS, RVT

## 2014-04-26 ENCOUNTER — Encounter: Payer: Self-pay | Admitting: *Deleted

## 2014-04-26 ENCOUNTER — Telehealth: Payer: Self-pay | Admitting: *Deleted

## 2014-04-26 DIAGNOSIS — Z79899 Other long term (current) drug therapy: Secondary | ICD-10-CM

## 2014-04-26 DIAGNOSIS — D689 Coagulation defect, unspecified: Secondary | ICD-10-CM

## 2014-04-26 NOTE — Telephone Encounter (Signed)
Lab slip mailed to patient for pre procedure labs.

## 2014-04-26 NOTE — Telephone Encounter (Signed)
Message copied by Marella Bile on Tue Apr 26, 2014  1:25 PM ------      Message from: Runell Gess      Created: Sat Apr 23, 2014  3:30 PM       Newly occluded LSFA. Will need angio ------

## 2014-04-26 NOTE — Telephone Encounter (Signed)
Patient notified and agreeable to proceed with angio. Procedure information given to patient and info packet mailed.

## 2014-05-12 ENCOUNTER — Encounter (HOSPITAL_COMMUNITY): Payer: Self-pay | Admitting: Pharmacy Technician

## 2014-05-12 LAB — CBC
HCT: 41.3 % (ref 39.0–52.0)
HEMOGLOBIN: 14.6 g/dL (ref 13.0–17.0)
MCH: 31.5 pg (ref 26.0–34.0)
MCHC: 35.4 g/dL (ref 30.0–36.0)
MCV: 89 fL (ref 78.0–100.0)
Platelets: 262 10*3/uL (ref 150–400)
RBC: 4.64 MIL/uL (ref 4.22–5.81)
RDW: 14.7 % (ref 11.5–15.5)
WBC: 9.3 10*3/uL (ref 4.0–10.5)

## 2014-05-12 LAB — BASIC METABOLIC PANEL
BUN: 9 mg/dL (ref 6–23)
CHLORIDE: 101 meq/L (ref 96–112)
CO2: 28 meq/L (ref 19–32)
CREATININE: 0.69 mg/dL (ref 0.50–1.35)
Calcium: 9.1 mg/dL (ref 8.4–10.5)
Glucose, Bld: 93 mg/dL (ref 70–99)
Potassium: 4.1 mEq/L (ref 3.5–5.3)
Sodium: 140 mEq/L (ref 135–145)

## 2014-05-12 LAB — PROTIME-INR
INR: 1.05 (ref <1.50)
PROTHROMBIN TIME: 13.7 s (ref 11.6–15.2)

## 2014-05-12 LAB — APTT: APTT: 34 s (ref 24–37)

## 2014-05-16 ENCOUNTER — Ambulatory Visit (HOSPITAL_COMMUNITY)
Admission: RE | Admit: 2014-05-16 | Discharge: 2014-05-16 | Disposition: A | Discharge status: Home / Self Care | Payer: Medicare Other | Source: Ambulatory Visit | Attending: Cardiovascular Disease | Admitting: Cardiovascular Disease

## 2014-05-16 ENCOUNTER — Encounter (HOSPITAL_COMMUNITY)
Admission: RE | Disposition: A | Discharge status: Home / Self Care | Payer: Self-pay | Source: Ambulatory Visit | Attending: Cardiovascular Disease

## 2014-05-16 DIAGNOSIS — J4489 Other specified chronic obstructive pulmonary disease: Secondary | ICD-10-CM | POA: Insufficient documentation

## 2014-05-16 DIAGNOSIS — Z7982 Long term (current) use of aspirin: Secondary | ICD-10-CM | POA: Diagnosis not present

## 2014-05-16 DIAGNOSIS — I10 Essential (primary) hypertension: Secondary | ICD-10-CM | POA: Insufficient documentation

## 2014-05-16 DIAGNOSIS — IMO0002 Reserved for concepts with insufficient information to code with codable children: Secondary | ICD-10-CM | POA: Diagnosis not present

## 2014-05-16 DIAGNOSIS — R64 Cachexia: Secondary | ICD-10-CM | POA: Insufficient documentation

## 2014-05-16 DIAGNOSIS — J449 Chronic obstructive pulmonary disease, unspecified: Secondary | ICD-10-CM | POA: Insufficient documentation

## 2014-05-16 DIAGNOSIS — I739 Peripheral vascular disease, unspecified: Secondary | ICD-10-CM | POA: Diagnosis present

## 2014-05-16 DIAGNOSIS — Z7902 Long term (current) use of antithrombotics/antiplatelets: Secondary | ICD-10-CM | POA: Diagnosis not present

## 2014-05-16 DIAGNOSIS — F17200 Nicotine dependence, unspecified, uncomplicated: Secondary | ICD-10-CM | POA: Insufficient documentation

## 2014-05-16 DIAGNOSIS — I70229 Atherosclerosis of native arteries of extremities with rest pain, unspecified extremity: Secondary | ICD-10-CM | POA: Insufficient documentation

## 2014-05-16 DIAGNOSIS — I70219 Atherosclerosis of native arteries of extremities with intermittent claudication, unspecified extremity: Secondary | ICD-10-CM

## 2014-05-16 HISTORY — PX: LOWER EXTREMITY ANGIOGRAM: SHX5508

## 2014-05-16 SURGERY — ANGIOGRAM, LOWER EXTREMITY
Anesthesia: LOCAL

## 2014-05-16 MED ORDER — SODIUM CHLORIDE 0.9 % IV SOLN: 250.0000 mL | INTRAVENOUS | Status: DC | PRN

## 2014-05-16 MED ORDER — ONDANSETRON HCL 4 MG/2ML IJ SOLN: 4.0000 mg | Freq: Four times a day (QID) | INTRAMUSCULAR | Status: DC | PRN

## 2014-05-16 MED ORDER — LIDOCAINE HCL (PF) 1 % IJ SOLN
INTRAMUSCULAR | Status: AC
  Filled 2014-05-16: qty 30

## 2014-05-16 MED ORDER — MIDAZOLAM HCL 2 MG/2ML IJ SOLN
INTRAMUSCULAR | Status: AC
  Filled 2014-05-16: qty 2

## 2014-05-16 MED ORDER — ASPIRIN 81 MG PO CHEW
CHEWABLE_TABLET | ORAL | Status: AC
  Filled 2014-05-16: qty 1

## 2014-05-16 MED ORDER — SODIUM CHLORIDE 0.9 % IJ SOLN: 3.0000 mL | INTRAMUSCULAR | Status: DC | PRN

## 2014-05-16 MED ORDER — HEPARIN (PORCINE) IN NACL 2-0.9 UNIT/ML-% IJ SOLN
INTRAMUSCULAR | Status: AC
  Filled 2014-05-16: qty 1000

## 2014-05-16 MED ORDER — ACETAMINOPHEN 325 MG PO TABS: 650.0000 mg | ORAL_TABLET | ORAL | Status: DC | PRN

## 2014-05-16 MED ORDER — SODIUM CHLORIDE 0.9 % IV SOLN
INTRAVENOUS | Status: DC
  Administered 2014-05-16: 08:00:00 via INTRAVENOUS

## 2014-05-16 MED ORDER — MORPHINE SULFATE 2 MG/ML IJ SOLN
INTRAMUSCULAR | Status: AC
  Filled 2014-05-16: qty 1

## 2014-05-16 MED ORDER — ASPIRIN 81 MG PO CHEW
81.0000 mg | CHEWABLE_TABLET | ORAL | Status: AC
  Administered 2014-05-16: 81 mg via ORAL

## 2014-05-16 MED ORDER — FENTANYL CITRATE 0.05 MG/ML IJ SOLN
INTRAMUSCULAR | Status: AC
  Filled 2014-05-16: qty 2

## 2014-05-16 MED ORDER — SODIUM CHLORIDE 0.9 % IJ SOLN: 3.0000 mL | Freq: Two times a day (BID) | INTRAMUSCULAR | Status: DC

## 2014-05-16 MED ORDER — MORPHINE SULFATE 2 MG/ML IJ SOLN
2.0000 mg | INTRAMUSCULAR | Status: DC | PRN
  Administered 2014-05-16: 2 mg via INTRAVENOUS

## 2014-05-16 MED ORDER — SODIUM CHLORIDE 0.9 % IV SOLN: INTRAVENOUS | Status: AC

## 2014-05-16 NOTE — Progress Notes (Signed)
Site area: rt groin Site Prior to Removal:  Level 0 Pressure Applied For: 20 minutes Manual:   yes Patient Status During Pull:  stable Post Pull Site:  Level 0 Post Pull Instructions Given:  yes Post Pull Pulses Present: rt popliteal, lt dp dopplered Dressing Applied:  yes Bedrest begins @ 1100 Comments: no complications

## 2014-05-16 NOTE — H&P (View-Only) (Signed)
04/19/2014 Christopher Pena   06-05-1946  [id number]  Primary Physician No PCP Per Patient Primary Cardiologist: Runell Gess MD [name]   HPI:  Christopher Pena is a 68 year old thin and cachectic appearing divorced Caucasian male father of 2, grandfather 52 grandchildren is a retired Customer service manager. He is referred by Dr. Rennis Golden for cardiovascular evaluation. Factors include 50-100-pack-year history of tobacco use having smoked one pack a day recently and tried to stop the cigarettes. He does have hypertension. He's never had a heart attack or stroke. He does of COPD. He complained of several months of lifestyle limiting claudication which has gotten worse now unable to walk more than 20 yards. He does have resting pain at night. Dopplers performed in office on 04/16/13 revealed a right ABI 0.23 and left appropriate suggesting critical limb ischemia. A subsequent Myoview stress test was low risk. On 05/10/13 a performed r angiography demonstrating occluded SFAs bilaterally. I recanalize his right SFA which resulted in improvement in his right lower [name] location and healing of his wound. He continued to have left lower extremity claudication and nonhealing wound.I performed percutaneous opacification of his left lower extremity on 06/08/13 using IDEV self expanding stents.his followup arterial Doppler studies performed 06/25/13 were completely normal. His pain had resolved and his wounds were healing. He does continue to smoke however.  I saw him in the office on 10/26/13 at which time he had fairly sudden onset of right lower extremity discomfort consistent with critical limb ischemia. I angiogram him 2 days later revealing what appeared to be a thrombotic occluded right SFA. His anatomy did not appear to be percutaneously revascularizable and therefore he underwent right profunda femoris to popliteal bypass grafting by Dr. Myra Gianotti on 11/12/13 using ipsilateral lateral non-reversed  translocated vein. The symptoms have slowly resolved. He has seen Dr. Myra Gianotti back in the office for followup. He does complain of new left lower extremity claudication. Lower extremity arterial Doppler studies performed in our office on 12/2513 revealed a right ABI of 1.1 with a patent right femoropopliteal bypass graft a left ABI of 0.9 with moderate left popliteal artery disease.    Current Outpatient Prescriptions  Medication Sig Dispense Refill  . ALPRAZolam (XANAX) 1 MG tablet Take 1 mg by mouth 3 (three) times daily as needed for anxiety.       Marland Kitchen aspirin EC 81 MG tablet Take 81 mg by mouth daily.      . clopidogrel (PLAVIX) 75 MG tablet Take 1 tablet (75 mg total) by mouth daily.  30 tablet  5  . Oxycodone HCl 10 MG TABS Take 10 mg by mouth every 4 (four) hours.       No current facility-administered medications for this visit.    No Known Allergies  History   Social History  . Marital Status: Divorced    Spouse Name: N/A    Number of Children: N/A  . Years of Education: N/A   Occupational History  . former golf pro    Social History Main Topics  . Smoking status: Current Every Day Smoker -- 0.50 packs/day for 50 years    Types: Cigarettes    Start date: 07/03/1963  . Smokeless tobacco: Never Used     Comment: states "trying to quit"  . Alcohol Use: No     Comment: 06/08/2013 "quit 02/2007; drank my share before that though"  . Drug Use: No  . Sexual Activity: Yes   Other Topics Concern  . Not  on file   Social History Narrative  . No narrative on file     Review of Systems: General: negative for chills, fever, night sweats or weight changes.  Cardiovascular: negative for chest pain, dyspnea on exertion, edema, orthopnea, palpitations, paroxysmal nocturnal dyspnea or shortness of breath Dermatological: negative for rash Respiratory: negative for cough or wheezing Urologic: negative for hematuria Abdominal: negative for nausea, vomiting, diarrhea, bright red  blood per rectum, melena, or hematemesis Neurologic: negative for visual changes, syncope, or dizziness All other systems reviewed and are otherwise negative except as noted above.    Blood pressure 122/66, pulse 84, height  (1.727 m), weight 130 lb (58.968 kg).  General appearance: alert and no distress Neck: no adenopathy, no carotid bruit, no JVD, supple, symmetrical, trachea midline and thyroid not enlarged, symmetric, no tenderness/mass/nodules Lungs: clear to auscultation bilaterally Heart: regular rate and rhythm, S1, S2 normal, no murmur, click, rub or gallop Extremities: extremities normal, atraumatic, no cyanosis or edema and 1+ pedal pulses bilaterally  EKG normal sinus rhythm at 83 without ST or T wave changes  ASSESSMENT AND PLAN:   Tobacco use Significantly reduced, nonsmoking e-cigarettes  PAD (peripheral artery disease), PTA/stent IDEV & chocolate baloon 06/08/13, Previous PTA/Stent to Rt SFA 05/10/13 History of right SFA intervention with subsequent occlusion of the right requiring femoropopliteal bypass grafting by Dr. Myra Gianotti 11/12/13  with using ipsilateral non-reversed translocated vein.followup lower extremity arterial Doppler studies in our office 01/13/14 revealed a patent femoropopliteal bypass graft with an ABI of 1.1 on that side. His left-sided ABI was 0.9 with a mild to moderate stenosis in the popliteal area. He does complain of some left calf claudication.      Runell Gess MD FACP,FACC,FAHA, [facility] 04/19/2014 12:01 PM

## 2014-05-16 NOTE — Discharge Instructions (Signed)
Angiogram, Care After °Refer to this sheet in the next few weeks. These instructions provide you with information on caring for yourself after your procedure. Your health care provider may also give you more specific instructions. Your treatment has been planned according to current medical practices, but problems sometimes occur. Call your health care provider if you have any problems or questions after your procedure.  °WHAT TO EXPECT AFTER THE PROCEDURE °After your procedure, it is typical to have the following sensations: °· Minor discomfort or tenderness and a small bump at the catheter insertion site. The bump should usually decrease in size and tenderness within 1 to 2 weeks. °· Any bruising will usually fade within 2 to 4 weeks. °HOME CARE INSTRUCTIONS  °· You may need to keep taking blood thinners if they were prescribed for you. Take medicines only as directed by your health care provider. °· Do not apply powder or lotion to the site. °· Do not take baths, swim, or use a hot tub until your health care provider approves. °· You may shower 24 hours after the procedure. Remove the bandage (dressing) and gently wash the site with plain soap and water. Gently pat the site dry. °· Inspect the site at least twice daily. °· Limit your activity for the first 48 hours. Do not bend, squat, or lift anything over 20 lb (9 kg) or as directed by your health care provider. °· Plan to have someone take you home after the procedure. Follow instructions about when you can drive or return to work. °SEEK MEDICAL CARE IF: °· You get light-headed when standing up. °· You have drainage (other than a small amount of blood on the dressing). °· You have chills. °· You have a fever. °· You have redness, warmth, swelling, or pain at the insertion site. °SEEK IMMEDIATE MEDICAL CARE IF:  °· You develop chest pain or shortness of breath, feel faint, or pass out. °· You have bleeding, swelling larger than a walnut, or drainage from the  catheter insertion site. °· You develop pain, discoloration, coldness, or severe bruising in the leg or arm that held the catheter. °· You have heavy bleeding from the site. If this happens, hold pressure on the site and call 911. °MAKE SURE YOU: °· Understand these instructions. °· Will watch your condition. °· Will get help right away if you are not doing well or get worse. °Document Released: 02/21/2005 Document Revised: 12/20/2013 Document Reviewed: 12/28/2012 °Exit[employer]® Patient Information ©2015 [employer]. This information is not intended to replace advice given to you by your health care provider. Make sure you discuss any questions you have with your health care provider. ° °

## 2014-05-16 NOTE — Interval H&P Note (Signed)
History and Physical Interval Note:  05/16/2014 9:21 AM  Christopher Pena  has presented today for surgery, with the diagnosis of pad  The various methods of treatment have been discussed with the patient and family. After consideration of risks, benefits and other options for treatment, the patient has consented to  Procedure(s): LOWER EXTREMITY ANGIOGRAM (N/A) as a surgical intervention .  The patient's history has been reviewed, patient examined, no change in status, stable for surgery.  I have reviewed the patient's chart and labs.  Questions were answered to the patient's satisfaction.     Runell Gess

## 2014-05-16 NOTE — CV Procedure (Signed)
Christopher Pena is a 68 y.o. male    [id number] LOCATION:  FACILITY: [facility]  PHYSICIAN: Nanetta Batty, M.D. 1945/10/15   DATE OF PROCEDURE:  05/16/2014  DATE OF DISCHARGE:     PV Angiogram/Intervention    History obtained from chart review.Christopher Pena is a 68 year old thin and cachectic appearing divorced Caucasian male father of 2, grandfather 7 grandchildren is a retired Customer service manager. He is referred by Dr. Rennis Golden for cardiovascular evaluation. Factors include 50-100-pack-year history of tobacco use having smoked one pack a day recently and tried to stop the cigarettes. He does have hypertension. He's never had a heart attack or stroke. He does of COPD. He complained of several months of lifestyle limiting claudication which has gotten worse now unable to walk more than 20 yards. He does have resting pain at night. Dopplers performed in office on 04/16/13 revealed a right ABI 0.23 and left appropriate suggesting critical limb ischemia. A subsequent Myoview stress test was low risk. On 05/10/13 a performed r angiography demonstrating occluded SFAs bilaterally. I recanalize his right SFA which resulted in improvement in his right lower [name] location and healing of his wound. He continued to have left lower extremity claudication and nonhealing wound.I performed percutaneous opacification of his left lower extremity on 06/08/13 using IDEV self expanding stents.his followup arterial Doppler studies performed 06/25/13 were completely normal. His pain had resolved and his wounds were healing. He does continue to smoke however.  I saw him in the office on 10/26/13 at which time he had fairly sudden onset of right lower extremity discomfort consistent with critical limb ischemia. I angiogram him 2 days later revealing what appeared to be a thrombotic occluded right SFA. His anatomy did not appear to be percutaneously revascularizable and therefore he underwent right profunda femoris to popliteal bypass  grafting by Dr. Myra Gianotti on 11/12/13 using ipsilateral lateral non-reversed translocated vein. The symptoms have slowly resolved. He has seen Dr. Myra Gianotti back in the office for followup. He does complain of new left lower extremity claudication. Lower extremity arterial Doppler studies performed in our office on 12/2513 revealed a right ABI of 1.1 with a patent right femoropopliteal bypass graft a left ABI of 0.9 with moderate left popliteal artery disease. Past developed worsening left lower extremity claudication. Recent Dopplers reveal a decrease in his left from .9 to 0.3. He presents now for angiography and potential intervention.   PROCEDURE DESCRIPTION:   The patient was brought to the second floor [place] [facility] cath lab in the postabsorptive state. He was premedicated with Valium 5 mg by mouth, IV Versed and fentanyl. His right groinwas prepped and shaved in usual sterile fashion. Xylocaine 1% was used for local anesthesia. A 5 French sheath was inserted into the recommend femoral artery using standard Seldinger technique. A 5 French pigtail catheter was placed in the distal aorta. Distal abdominal aortography, a laparoscopic cholecystectomy at angiography with bifemoral runoff was performed using bolus chase digital subtraction step table technique. Omnipaque dye was used for the entirety of the case. Retrograde aortic pressure was monitored during the case.   HEMODYNAMICS:    AO SYSTOLIC/AO DIASTOLIC: 127/63   Angiographic Data:   1: Abdominal aortogram-the distal abdominal aorta was free of significant disease  2: Left lower extremity-the left common and external iliac arteries were free of significant disease. The left SFA was occluded at its origin and did not not reconstitute until the P3 segments by profunda femoris collaterals. The entire stented segment and segment proximal to  that were occluded there was two-vessel runoff.  3: Right lower extremity-the right SFA was occluded.  The right profunda popliteal bypass graft had developed progressive disease in the distal third with an 80% pre-anastomotic lesion. There were tandem 80% lesion just proximal to this. There is three-vessel runoff.  IMPRESSION:Christopher Pena has had occlusion of his left SFA with an ABI of 0.3 range. I do not believe he has a percutaneous options for revascularization on the side. His right profunda femoris bypass graft has had progressive progression of disease don't need to further evaluate. The sheath was removed and pressure was held on the groin to achieve hemostasis. The patient left the lab in stable condition. He'll be gently hydrated and discharged home. I will see him back in the office in one to 2 weeks for followup.   Runell Gess MD, [facility] 05/16/2014 9:57 AM

## 2014-05-31 ENCOUNTER — Ambulatory Visit (INDEPENDENT_AMBULATORY_CARE_PROVIDER_SITE_OTHER): Payer: Medicare Other | Admitting: Cardiology

## 2014-05-31 ENCOUNTER — Encounter: Payer: Self-pay | Admitting: Cardiology

## 2014-05-31 VITALS — BP 130/87 | HR 93 | Ht 68.0 in | Wt 126.4 lb

## 2014-05-31 DIAGNOSIS — R000 Tachycardia, unspecified: Secondary | ICD-10-CM

## 2014-05-31 NOTE — Patient Instructions (Signed)
No changes have been made today in your current medications or treatment plan.  Please keep your appointment with Dr Allyson SabalBerry on 06/21/2014 at 11:00a.

## 2014-05-31 NOTE — Progress Notes (Signed)
Patient ID: Christopher Pena, male   DOB: [birth year], 68 y.o.   MRN: [id number]    05/31/2014 Sandria BalesDavid H Quesenberry   [birth year]  [id number]  Primary Physicia No PCP Per Patient Primary Cardiologist: Dr. Allyson Sabalberry  HPI:  The patient is a 68 year old male, followed by Dr Allyson SabalBerry, who presents to clinic for post procedural followup. He has a history of long-term tobacco abuse as well as severe bilateral peripheral vascular disease. He's had prior PV interventions to both his right and left SFAs. Recently in March of this year, he endorsed right lower extremity discomfort consistent with critical limb ischemia. He underwent repeat PV angiogram which revealed a thrombotic occluded right SFA, not amenable to percutaneous revascularization. He ended up undergoing popliteal bypass grafting by Dr. Myra GianottiBrabham on 11/12/2013 using an ipsilateral non-reversed translocated vein. Most recently, he developed worsening left lower extremity claudication. Repeat Doppler studies revealed a decrease in his left ABI from 0.9-0.3. Subsequently, he underwent repeat angiography by Dr. Allyson SabalBerry on 05/16/2014. Access was obtained via the right femoral artery. Was found to have occlusion of his left SFA, not amenable to percutaneous revascularization. His right profunda femoris bypass graft has had progressive progression of disease in the distal third with an 80% pre-anastomotic lesion. There was tandem 80% lesion just proximal to this with three-vessel runoff. No intervention was performed. He left the Cath Lab in stable condition.  He presents back to clinic today for post procedural followup. His femoral access site is stable without complication. He denies anterior groin, flank and low back pain. He continues to have bilateral lower extremity claudication. He can walk more no more than 100 feet before having to stop. Symptoms improve with rest. He denies resting leg pain. No foot ulcers or wounds. Unfortunately, he continues to smoke cigarettes,  on average a half pack per day.      Current Outpatient Prescriptions  Medication Sig Dispense Refill  . ALPRAZolam (XANAX) 1 MG tablet Take 1 mg by mouth 3 (three) times daily as needed for anxiety.       Marland Kitchen. aspirin EC 81 MG tablet Take 81 mg by mouth daily.      . clopidogrel (PLAVIX) 75 MG tablet Take 75 mg by mouth daily.      Marland Kitchen. oxyCODONE (ROXICODONE) 15 MG immediate release tablet Take 15 mg by mouth every 4 (four) hours as needed for pain.       No current facility-administered medications for this visit.    No Known Allergies  History   Social History  . Marital Status: Divorced    Spouse Name: N/A    Number of Children: N/A  . Years of Education: N/A   Occupational History  . former golf pro    Social History Main Topics  . Smoking status: Current Every Day Smoker -- 0.50 packs/day for 50 years    Types: Cigarettes    Start date: 07/03/1963  . Smokeless tobacco: Never Used     Comment: states "trying to quit"  . Alcohol Use: No     Comment: 06/08/2013 "quit 02/2007; drank my share before that though"  . Drug Use: No  . Sexual Activity: Yes   Other Topics Concern  . Not on file   Social History Narrative  . No narrative on file     Review of Systems: General: negative for chills, fever, night sweats or weight changes.  Cardiovascular: negative for chest pain, dyspnea on exertion, edema, orthopnea, palpitations, paroxysmal nocturnal dyspnea or shortness of breath  Dermatological: negative for rash Respiratory: negative for cough or wheezing Urologic: negative for hematuria Abdominal: negative for nausea, vomiting, diarrhea, bright red blood per rectum, melena, or hematemesis Neurologic: negative for visual changes, syncope, or dizziness All other systems reviewed and are otherwise negative except as noted above.    Blood pressure 130/87, pulse 93, height 5\' 8"  (1.727 m), weight 126 lb 6.4 oz (57.335 kg).  General appearance: alert, cooperative and no  distress Neck: no carotid bruit and no JVD Lungs: clear to auscultation bilaterally Heart: regular rate and rhythm, S1, S2 normal, no murmur, click, rub or gallop Extremities: no LEE Pulses: 2+ and symmetric weak bilateral distal pulses Skin: warm and dry, distal digits bilaterally are slightly purple in color Neurologic: Grossly normal  EKG NSR HR 93 bpm  ASSESSMENT AND PLAN:   1. bilateral peripheral vascular disease: Repeat PV angiogram reveals worsening bilateral peripheral vascular disease with reocclusion of his left SFA as well as progressive progression of disease in his right profunda femoris bypass graft. His PVD is not amenable to percutaneous intervention. For now, continue medical therapy with aspirin and Plavix. His right femoral access site is stable and free of complication. He is scheduled for followup with Dr. Allyson SabalBerry on 06/21/2014 to discuss further options.  2. tobacco abuse: Smoking cessation strongly advised  PLAN  continue aspirin and Plavix. Follow up with Dr. Allyson SabalBerry 06/21/2014.  Glenys Snader, BRITTAINYPA-C 05/31/2014 11:14 AM

## 2014-06-21 ENCOUNTER — Ambulatory Visit (INDEPENDENT_AMBULATORY_CARE_PROVIDER_SITE_OTHER): Payer: Medicare Other | Admitting: Cardiovascular Disease

## 2014-06-21 ENCOUNTER — Encounter: Payer: Self-pay | Admitting: Cardiovascular Disease

## 2014-06-21 VITALS — BP 110/76 | HR 88 | Ht 68.0 in | Wt 130.6 lb

## 2014-06-21 DIAGNOSIS — I10 Essential (primary) hypertension: Secondary | ICD-10-CM

## 2014-06-21 DIAGNOSIS — I739 Peripheral vascular disease, unspecified: Secondary | ICD-10-CM

## 2014-06-21 NOTE — Progress Notes (Signed)
06/21/2014 Christopher Pena   [birth year]  [id number]  Primary Physician No PCP Per Patient Primary Cardiologist: Runell GessJonathan J. Adair Lauderback MD Roseanne RenoFACP,FACC,FAHA, FSCAI   HPI:  [name] is a 68 year old thin and cachectic appearing divorced Caucasian male father of 2, grandfather 7322 grandchildren is a retired Customer service managergolf professional. He is referred by Dr. Rennis GoldenHilty for cardiovascular evaluation. Factors include 50-100-pack-year history of tobacco use having smoked one pack a day recently and tried to stop the cigarettes. He does have hypertension. He's never had a heart attack or stroke. He does of COPD. He complained of several months of lifestyle limiting claudication which has gotten worse now unable to walk more than 20 yards. He does have resting pain at night. Dopplers performed in office on 04/16/13 revealed a right ABI 0.23 and left appropriate suggesting critical limb ischemia. A subsequent Myoview stress test was low risk. On 05/10/13 a performed r angiography demonstrating occluded SFAs bilaterally. I recanalize his right SFA which resulted in improvement in his right lower [name] location and healing of his wound. He continued to have left lower extremity claudication and nonhealing wound.I performed percutaneous opacification of his left lower extremity on 06/08/13 using IDEV self expanding stents.his followup arterial Doppler studies performed 06/25/13 were completely normal. His pain had resolved and his wounds were healing. He does continue to smoke however.  I saw him in the office on 10/26/13 at which time he had fairly sudden onset of right lower extremity discomfort consistent with critical limb ischemia. I angiogram him 2 days later revealing what appeared to be a thrombotic occluded right SFA. His anatomy did not appear to be percutaneously revascularizable and therefore he underwent right profunda femoris to popliteal bypass grafting by Dr. Myra GianottiBrabham on 11/12/13 using ipsilateral lateral non-reversed  translocated vein. The symptoms have slowly resolved. He has seen Dr. Myra GianottiBrabham back in the office for followup. He does complain of new left lower extremity claudication. Lower extremity arterial Doppler studies performed in our office on 12/2513 revealed a right ABI of 1.1 with a patent right femoropopliteal bypass graft a left ABI of 0.9 with moderate left popliteal artery disease. Past developed worsening left lower extremity claudication. Recent Dopplers reveal a decrease in his left from .9 to 0.3. I performed angiography on him 05/16/14 revealing a functionally occluded right profunda to below-the-knee popliteal bypass graft and an occluded left SFA. He does have Rutherford class 4 claudication and really has no further percutaneous or surgical options for revascularization.   Current Outpatient Prescriptions  Medication Sig Dispense Refill  . ALPRAZolam (XANAX) 1 MG tablet Take 1 mg by mouth 3 (three) times daily as needed for anxiety.     Marland Kitchen. aspirin EC 81 MG tablet Take 81 mg by mouth daily.    . clopidogrel (PLAVIX) 75 MG tablet Take 75 mg by mouth daily.    Marland Kitchen. oxyCODONE (ROXICODONE) 15 MG immediate release tablet Take 15 mg by mouth every 4 (four) hours as needed for pain.     No current facility-administered medications for this visit.    No Known Allergies  History   Social History  . Marital Status: Divorced    Spouse Name: N/A    Number of Children: N/A  . Years of Education: N/A   Occupational History  . former golf pro    Social History Main Topics  . Smoking status: Current Every Day Smoker -- 0.25 packs/day for 50 years    Types: Cigarettes    Start date: 07/03/1963  .  Smokeless tobacco: Never Used     Comment: states "trying to quit"  . Alcohol Use: No     Comment: 06/08/2013 "quit 02/2007; drank my share before that though"  . Drug Use: No  . Sexual Activity: Yes   Other Topics Concern  . Not on file   Social History Narrative     Review of Systems: General:  negative for chills, fever, night sweats or weight changes.  Cardiovascular: negative for chest pain, dyspnea on exertion, edema, orthopnea, palpitations, paroxysmal nocturnal dyspnea or shortness of breath Dermatological: negative for rash Respiratory: negative for cough or wheezing Urologic: negative for hematuria Abdominal: negative for nausea, vomiting, diarrhea, bright red blood per rectum, melena, or hematemesis Neurologic: negative for visual changes, syncope, or dizziness All other systems reviewed and are otherwise negative except as noted above.    Blood pressure 110/76, pulse 88, height 5\' 8"  (1.727 m), weight 130 lb 9.6 oz (59.24 kg).  General appearance: alert and no distress Neck: no adenopathy, no carotid bruit, no JVD, supple, symmetrical, trachea midline and thyroid not enlarged, symmetric, no tenderness/mass/nodules Lungs: clear to auscultation bilaterally Heart: regular rate and rhythm, S1, S2 normal, no murmur, click, rub or gallop Extremities: extremities normal, atraumatic, no cyanosis or edema  EKG not performed.  ASSESSMENT AND PLAN:   PAD (peripheral artery disease), PTA/stent IDEV & chocolate baloon 06/08/13, Previous PTA/Stent to Rt SFA 05/10/13 [name] has severe PAD. He has occluded the stent in his right SFA and underwent subsequent right profunda to below the knee popliteal bypass grafting by Dr. Myra GianottiBrabham  11/12/13. He is also occluded his left SFA. Angiography which I performed 05/16/14 revealed a functionally occluded profunda to below the knee pop bypass graft and occluded left SFA. He does have Rutherford classic for claudication. He continues to smoke. I do not think he has any further percutaneous or surgical options for revascularization.  HTN (hypertension) Controlled on current medications      Runell GessJonathan J. Yaris Ferrell MD [facility] CenterFACP,FACC,FAHA, [facility] OrleansFSCAI 06/21/2014 11:57 AM

## 2014-06-21 NOTE — Patient Instructions (Addendum)
We request that you follow-up in: 6 months with an extender and in 1 year with Dr San MorelleBerry  You will receive a reminder letter in the mail two months in advance. If you don't receive a letter, please call our office to schedule the follow-up appointment.  Dr Allyson SabalBerry has referred you back to Dr Myra GianottiBrabham for your peripheral artery disease.

## 2014-06-21 NOTE — Assessment & Plan Note (Signed)
Controlled on current medications 

## 2014-06-21 NOTE — Assessment & Plan Note (Signed)
[name] has severe PAD. He has occluded the stent in his right SFA and underwent subsequent right profunda to below the knee popliteal bypass grafting by Dr. Myra GianottiBrabham  11/12/13. He is also occluded his left SFA. Angiography which I performed 05/16/14 revealed a functionally occluded profunda to below the knee pop bypass graft and occluded left SFA. He does have Rutherford classic for claudication. He continues to smoke. I do not think he has any further percutaneous or surgical options for revascularization.

## 2014-07-12 ENCOUNTER — Encounter: Payer: Self-pay | Admitting: Surgery

## 2014-07-13 ENCOUNTER — Encounter: Payer: Self-pay | Admitting: Surgery

## 2014-07-13 ENCOUNTER — Ambulatory Visit (INDEPENDENT_AMBULATORY_CARE_PROVIDER_SITE_OTHER): Payer: Medicare Other | Admitting: Surgery

## 2014-07-13 VITALS — BP 119/77 | HR 81 | Ht 68.0 in | Wt 129.1 lb

## 2014-07-13 DIAGNOSIS — I739 Peripheral vascular disease, unspecified: Secondary | ICD-10-CM

## 2014-07-13 MED ORDER — CILOSTAZOL 100 MG PO TABS: 100.0000 mg | ORAL_TABLET | Freq: Two times a day (BID) | ORAL | Status: DC

## 2014-07-13 NOTE — Progress Notes (Signed)
Patient name: Christopher Pena MRN: [id number] DOB: [birth year] Sex: male     Chief Complaint  Patient presents with  . Re-evaluation    discuss sugical intervention- PV Angio performed    HISTORY OF PRESENT ILLNESS: On 11/12/2013, he underwent a right femoral to below knee popliteal artery bypass graft using ipsilateral non-reversed translocated vein. The proximal anastomosis was to the profundofemoral artery, given the length of the vein. The patient had a slight separation in his below knee incision which has been undergoing dressing changes.  He has a history of failed bilateral percutaneous revascularization.  He developed a recurrence of symptoms and underwent angiography on September 28 which revealed a sclerotic vein graft which was nearly occluded.  The patient continues to smoke.  He also has a scab on the tip of his left great toe he continues to have claudication symptoms.  Past Medical History  Diagnosis Date  . Chronic low back pain     "degenerative spine dx'd ~ 7 yr ago" (06/08/2013)  . COPD (chronic obstructive pulmonary disease)   . Anxiety   . Claudication   . Unexplained weight loss   . Tobacco abuse   . Hypertension     "moderately high; RX didn't help" (06/08/2013)  . PAD (peripheral artery disease), PTA/stent IDEV & chocolate baloon 06/08/13 04/13/2013    Severely reduced ABI's of 0.3 bilaterally   . Non-healing wound of lower extremity 06/09/2013  . Tobacco use 06/09/2013  . Emphysema of lung   . Shortness of breath     w/ exertion  . Neuromuscular disorder     degen spine    Past Surgical History  Procedure Laterality Date  . Abi      04/09/13; abnormal  . Femoral artery stent Right 05/10/13    2 stents Rt SFA  . Femoral artery stent Left 06/08/2013  . Appendectomy  05/2001  . Femoral-popliteal bypass graft Right 11/12/2013    Procedure: RIGHT  FEMORAL-BELOW KNEE POPLITEAL ARTERY BYPASS GRAFT USING NON- REVERSE RIGHT GREATER SAPHENOUS VEIN.;   Surgeon: Nada LibmanVance W Brabham, MD;  Location: [facility] OR;  Service: Vascular;  Laterality: Right;    History   Social History  . Marital Status: Divorced    Spouse Name: N/A    Number of Children: N/A  . Years of Education: N/A   Occupational History  . former golf pro    Social History Main Topics  . Smoking status: Current Every Day Smoker -- 0.25 packs/day for 50 years    Types: Cigarettes    Start date: 07/03/1963  . Smokeless tobacco: Never Used     Comment: states "trying to quit"  . Alcohol Use: No     Comment: 06/08/2013 "quit 02/2007; drank my share before that though"  . Drug Use: No  . Sexual Activity: Yes   Other Topics Concern  . Not on file   Social History Narrative    Family History  Problem Relation Age of Onset  . Hyperlipidemia Mother   . Heart disease Mother   . Diabetes Mother   . Cancer Father     Liver    Allergies as of 07/13/2014  . (No Known Allergies)    Current Outpatient Prescriptions on File Prior to Visit  Medication Sig Dispense Refill  . ALPRAZolam (XANAX) 1 MG tablet Take 1 mg by mouth 3 (three) times daily as needed for anxiety.     Marland Kitchen. aspirin EC 81 MG tablet Take 81 mg by mouth  daily.    . clopidogrel (PLAVIX) 75 MG tablet Take 75 mg by mouth daily.    Marland Kitchen. oxyCODONE (ROXICODONE) 15 MG immediate release tablet Take 15 mg by mouth every 4 (four) hours as needed for pain.     No current facility-administered medications on file prior to visit.     REVIEW OF SYSTEMS: Please see history of present illness, otherwise no changes from prior visit.  PHYSICAL EXAMINATION:   Vital signs are BP 119/77 mmHg  Pulse 81  Ht 5\' 8"  (1.727 m)  Wt 129 lb 1.6 oz (58.559 kg)  BMI 19.63 kg/m2  SpO2 96% General: The patient appears their stated age. HEENT:  No gross abnormalities Pulmonary:  Non labored breathing  Musculoskeletal: There are no major deformities. Neurologic: No focal weakness or paresthesias are detected, Skin: Scab to the tip of  the left great toe Psychiatric: The patient has normal affect. Cardiovascular: There is a regular rate and rhythm without significant murmur appreciated.  Pedal pulses are not palpable   Diagnostic Studies None  Assessment: Peripheral vascular disease with claudication Plan: The patient had early failure of bilateral femoral-popliteal interventions.  He then went on to have a right femoral to below-knee popliteal artery bypass graft.  He had an excellent caliber vein, however within 4 months this became sclerotic and has nearly occluded.  The patient continues to smoke.  He does have a scab on the tip of his left great toe.  I discussed with the patient that I would not intervene unless absolutely necessary until he stops smoking.  He would need a right femoral to below-knee popliteal artery bypass graft with Gore-Tex.  I would not do this except in the setting of rest pain or nonhealing wounds.  On the left he will likely need a tibial bypass.  I reviewed his previous vein mapping he does have an adequate vein.  Again I would not intervene unless he develops rest pain or nonhealing wounds.  He is going to pay close attention to his wound on the left great toe and if it gets worse he will contact me.  The patient still suffers from claudication and therefore I'm starting him on Pletal today.  I'm glad to have a follow-up in 3 months.  Jorge NyV. Wells Brabham IV, M.D. [facility] Office: [phone] Pager:  [phone]

## 2014-07-28 ENCOUNTER — Encounter (HOSPITAL_COMMUNITY): Payer: Self-pay | Admitting: Cardiovascular Disease

## 2014-07-28 ENCOUNTER — Other Ambulatory Visit: Payer: Self-pay | Admitting: Cardiovascular Disease

## 2014-07-28 MED ORDER — CLOPIDOGREL BISULFATE 75 MG PO TABS: 75.0000 mg | ORAL_TABLET | Freq: Every day | ORAL | Status: DC

## 2014-07-28 NOTE — Telephone Encounter (Signed)
Refilled Rx, sent pt message.

## 2014-08-25 DIAGNOSIS — M545 Low back pain: Secondary | ICD-10-CM | POA: Diagnosis not present

## 2014-08-25 DIAGNOSIS — G8929 Other chronic pain: Secondary | ICD-10-CM | POA: Diagnosis not present

## 2014-08-25 DIAGNOSIS — M79605 Pain in left leg: Secondary | ICD-10-CM | POA: Diagnosis not present

## 2014-08-25 DIAGNOSIS — M79604 Pain in right leg: Secondary | ICD-10-CM | POA: Diagnosis not present

## 2014-09-22 DIAGNOSIS — M79605 Pain in left leg: Secondary | ICD-10-CM | POA: Diagnosis not present

## 2014-09-22 DIAGNOSIS — M79604 Pain in right leg: Secondary | ICD-10-CM | POA: Diagnosis not present

## 2014-09-22 DIAGNOSIS — M545 Low back pain: Secondary | ICD-10-CM | POA: Diagnosis not present

## 2014-09-22 DIAGNOSIS — G8929 Other chronic pain: Secondary | ICD-10-CM | POA: Diagnosis not present

## 2014-10-14 ENCOUNTER — Encounter: Payer: Self-pay | Admitting: Surgery

## 2014-10-17 ENCOUNTER — Encounter: Payer: Self-pay | Admitting: Surgery

## 2014-10-17 ENCOUNTER — Ambulatory Visit (INDEPENDENT_AMBULATORY_CARE_PROVIDER_SITE_OTHER): Payer: Medicare Other | Admitting: Surgery

## 2014-10-17 VITALS — BP 109/82 | HR 88 | Resp 16 | Ht 68.0 in | Wt 128.0 lb

## 2014-10-17 DIAGNOSIS — I739 Peripheral vascular disease, unspecified: Secondary | ICD-10-CM

## 2014-10-17 DIAGNOSIS — M25569 Pain in unspecified knee: Secondary | ICD-10-CM | POA: Diagnosis not present

## 2014-10-17 MED ORDER — CILOSTAZOL 100 MG PO TABS: 100.0000 mg | ORAL_TABLET | Freq: Two times a day (BID) | ORAL | Status: DC

## 2014-10-17 NOTE — Progress Notes (Signed)
Patient name: Christopher Pena MRN: [id number] DOB: Aug 22, 1945 Sex: male     Chief Complaint  Patient presents with  . Re-evaluation    F/U October 28, 2013 Angiogram C/O  Left Great toe painful to touch, ongoing bilat leg pain the same.    HISTORY OF PRESENT ILLNESS: On 11/12/2013, he underwent a right femoral to below knee popliteal artery bypass graft using ipsilateral non-reversed translocated vein. The proximal anastomosis was to the profundofemoral artery, given the length of the vein. The patient had a slight separation in his below knee incision which has been undergoing dressing changes. He has a history of failed bilateral percutaneous revascularization. He developed a recurrence of symptoms and underwent angiography on September 28 which revealed a sclerotic vein graft which was nearly occluded. The patient continues to smoke. He also has a scab on the tip of his left great toe he continues to have claudication symptoms.  His claudication symptoms are unchanged.  He did not start Pletal.  Past Medical History  Diagnosis Date  . Chronic low back pain     "degenerative spine dx'd ~ 7 yr ago" (06/08/2013)  . COPD (chronic obstructive pulmonary disease)   . Anxiety   . Claudication   . Unexplained weight loss   . Tobacco abuse   . Hypertension     "moderately high; RX didn't help" (06/08/2013)  . PAD (peripheral artery disease), PTA/stent IDEV & chocolate baloon 06/08/13 04/13/2013    Severely reduced ABI's of 0.3 bilaterally   . Non-healing wound of lower extremity 06/09/2013  . Tobacco use 06/09/2013  . Emphysema of lung   . Shortness of breath     w/ exertion  . Neuromuscular disorder     degen spine    Past Surgical History  Procedure Laterality Date  . Abi      04/09/13; abnormal  . Femoral artery stent Right 05/10/13    2 stents Rt SFA  . Femoral artery stent Left 06/08/2013  . Appendectomy  05/2001  . Femoral-popliteal bypass graft Right 11/12/2013   Procedure: RIGHT  FEMORAL-BELOW KNEE POPLITEAL ARTERY BYPASS GRAFT USING NON- REVERSE RIGHT GREATER SAPHENOUS VEIN.;  Surgeon: Nada LibmanVance W Brabham, MD;  Location: [facility] OR;  Service: Vascular;  Laterality: Right;  . Percutaneous stent intervention Right 05/10/2013    Procedure: PERCUTANEOUS STENT INTERVENTION;  Surgeon: Runell GessJonathan J Berry, MD;  [facility];  Service: Cardiovascular;  Laterality: Right;  rt sfa and popliteal stent x2  . Lower extremity angiogram Bilateral 05/10/2013    Procedure: LOWER EXTREMITY ANGIOGRAM;  Surgeon: Runell GessJonathan J Berry, MD;  [facility];  Service: Cardiovascular;  Laterality: Bilateral;  . Abdominal aortagram  05/10/2013    Procedure: ABDOMINAL AORTAGRAM;  Surgeon: Runell GessJonathan J Berry, MD;  [facility];  Service: Cardiovascular;;  . Lower extremity angiogram N/A 06/08/2013    Procedure: LOWER EXTREMITY ANGIOGRAM;  Surgeon: Runell GessJonathan J Berry, MD;  [facility];  Service: Cardiovascular;  Laterality: N/A;  . Lower extremity angiogram N/A 10/28/2013    Procedure: LOWER EXTREMITY ANGIOGRAM;  Surgeon: Runell GessJonathan J Berry, MD;  [facility];  Service: Cardiovascular;  Laterality: N/A;  . Lower extremity angiogram N/A 05/16/2014    Procedure: LOWER EXTREMITY ANGIOGRAM;  Surgeon: Runell GessJonathan J Berry, MD;  [facility];  Service: Cardiovascular;  Laterality: N/A;    History   Social History  . Marital Status: Divorced    Spouse Name: N/A  . Number of Children:  N/A  . Years of Education: N/A   Occupational History  . former golf pro    Social History Main Topics  . Smoking status: Light Tobacco Smoker -- 0.25 packs/day for 50 years    Types: Cigarettes    Start date: 07/03/1963  . Smokeless tobacco: Never Used     Comment: states "trying to quit"  . Alcohol Use: No     Comment: 06/08/2013 "quit 02/2007; drank my share before that though"  . Drug Use: No  . Sexual Activity: Yes   Other Topics Concern  . Not on file   Social  History Narrative    Family History  Problem Relation Age of Onset  . Hyperlipidemia Mother   . Heart disease Mother   . Diabetes Mother   . Cancer Father     Liver    Allergies as of 10/17/2014  . (No Known Allergies)    Current Outpatient Prescriptions on File Prior to Visit  Medication Sig Dispense Refill  . ALPRAZolam (XANAX) 1 MG tablet Take 1 mg by mouth 3 (three) times daily as needed for anxiety.     Marland Kitchen aspirin EC 81 MG tablet Take 81 mg by mouth daily.    . clopidogrel (PLAVIX) 75 MG tablet Take 1 tablet (75 mg total) by mouth daily. 30 tablet 5  . oxyCODONE (ROXICODONE) 15 MG immediate release tablet Take 15 mg by mouth every 4 (four) hours as needed for pain.    . cilostazol (PLETAL) 100 MG tablet Take 1 tablet (100 mg total) by mouth 2 (two) times daily before a meal. (Patient not taking: Reported on 10/17/2014) 60 tablet 3   No current facility-administered medications on file prior to visit.     REVIEW OF SYSTEMS: Cardiovascular: No chest pain, chest pressure, palpitations, orthopnea, or dyspnea on exertion. Positive claudication no rest pain,  No history of DVT or phlebitis. Pulmonary: No productive cough, asthma or wheezing. Neurologic: No weakness, paresthesias, aphasia, or amaurosis. No dizziness. Hematologic: No bleeding problems or clotting disorders. Musculoskeletal: No joint pain or joint swelling. Gastrointestinal: No blood in stool or hematemesis Genitourinary: No dysuria or hematuria. Psychiatric:: No history of major depression. Integumentary: No rashes or ulcers. Constitutional: No fever or chills.  PHYSICAL EXAMINATION:   Vital signs are BP 109/82 mmHg  Pulse 88  Resp 16  Ht  (1.727 m)  Wt 128 lb (58.06 kg)  BMI 19.47 kg/m2  SpO2 99% General: The patient appears their stated age. HEENT:  No gross abnormalities Pulmonary:  Non labored breathing Musculoskeletal: There are no major deformities. Neurologic: No focal weakness or  paresthesias are detected, Skin: There are no ulcer or rashes noted. Psychiatric: The patient has normal affect. Cardiovascular: There is a regular rate and rhythm without significant murmur appreciated. Pedal pulses not palpable  Diagnostic Studies None  Assessment: Claudiation Plan: Continue to try smoking cessation Start Pletal Follow up 6 months  V. Charlena Cross, M.D. [facility] [place] Office: [phone] Pager:  [phone]

## 2014-10-17 NOTE — Addendum Note (Signed)
Addended by: Sharee PimpleMCCHESNEY, Zev Blue K on: 10/17/2014 01:34 PM   Modules accepted: Orders

## 2014-10-20 DIAGNOSIS — G8929 Other chronic pain: Secondary | ICD-10-CM | POA: Diagnosis not present

## 2014-10-20 DIAGNOSIS — M545 Low back pain: Secondary | ICD-10-CM | POA: Diagnosis not present

## 2014-11-17 DIAGNOSIS — G8929 Other chronic pain: Secondary | ICD-10-CM | POA: Diagnosis not present

## 2014-11-17 DIAGNOSIS — M545 Low back pain: Secondary | ICD-10-CM | POA: Diagnosis not present

## 2014-12-15 DIAGNOSIS — G541 Lumbosacral plexus disorders: Secondary | ICD-10-CM | POA: Diagnosis not present

## 2014-12-15 DIAGNOSIS — M79605 Pain in left leg: Secondary | ICD-10-CM | POA: Diagnosis not present

## 2014-12-15 DIAGNOSIS — M545 Low back pain: Secondary | ICD-10-CM | POA: Diagnosis not present

## 2014-12-15 DIAGNOSIS — G603 Idiopathic progressive neuropathy: Secondary | ICD-10-CM | POA: Diagnosis not present

## 2014-12-15 DIAGNOSIS — G8929 Other chronic pain: Secondary | ICD-10-CM | POA: Diagnosis not present

## 2014-12-15 DIAGNOSIS — M79604 Pain in right leg: Secondary | ICD-10-CM | POA: Diagnosis not present

## 2015-01-12 DIAGNOSIS — G8929 Other chronic pain: Secondary | ICD-10-CM | POA: Diagnosis not present

## 2015-01-12 DIAGNOSIS — M79604 Pain in right leg: Secondary | ICD-10-CM | POA: Diagnosis not present

## 2015-01-12 DIAGNOSIS — M545 Low back pain: Secondary | ICD-10-CM | POA: Diagnosis not present

## 2015-01-12 DIAGNOSIS — M79605 Pain in left leg: Secondary | ICD-10-CM | POA: Diagnosis not present

## 2015-02-09 DIAGNOSIS — G8929 Other chronic pain: Secondary | ICD-10-CM | POA: Diagnosis not present

## 2015-02-09 DIAGNOSIS — M545 Low back pain: Secondary | ICD-10-CM | POA: Diagnosis not present

## 2015-02-10 DIAGNOSIS — G8929 Other chronic pain: Secondary | ICD-10-CM | POA: Diagnosis not present

## 2015-03-09 ENCOUNTER — Other Ambulatory Visit: Payer: Self-pay | Admitting: Cardiovascular Disease

## 2015-03-09 DIAGNOSIS — G8929 Other chronic pain: Secondary | ICD-10-CM | POA: Diagnosis not present

## 2015-03-09 DIAGNOSIS — M545 Low back pain: Secondary | ICD-10-CM | POA: Diagnosis not present

## 2015-03-09 NOTE — Telephone Encounter (Signed)
REFILL 

## 2015-04-06 DIAGNOSIS — Z79891 Long term (current) use of opiate analgesic: Secondary | ICD-10-CM | POA: Diagnosis not present

## 2015-04-06 DIAGNOSIS — M79605 Pain in left leg: Secondary | ICD-10-CM | POA: Diagnosis not present

## 2015-04-06 DIAGNOSIS — G8929 Other chronic pain: Secondary | ICD-10-CM | POA: Diagnosis not present

## 2015-04-06 DIAGNOSIS — M545 Low back pain: Secondary | ICD-10-CM | POA: Diagnosis not present

## 2015-04-06 DIAGNOSIS — M79604 Pain in right leg: Secondary | ICD-10-CM | POA: Diagnosis not present

## 2015-04-17 ENCOUNTER — Ambulatory Visit: Payer: Medicare Other | Admitting: Surgery

## 2015-05-04 ENCOUNTER — Other Ambulatory Visit: Payer: Self-pay | Admitting: Cardiovascular Disease

## 2015-05-04 DIAGNOSIS — M545 Low back pain: Secondary | ICD-10-CM | POA: Diagnosis not present

## 2015-05-04 DIAGNOSIS — G8929 Other chronic pain: Secondary | ICD-10-CM | POA: Diagnosis not present

## 2015-05-05 ENCOUNTER — Encounter: Payer: Self-pay | Admitting: Surgery

## 2015-05-08 ENCOUNTER — Ambulatory Visit (INDEPENDENT_AMBULATORY_CARE_PROVIDER_SITE_OTHER): Payer: Medicare Other | Admitting: Surgery

## 2015-05-08 ENCOUNTER — Encounter: Payer: Self-pay | Admitting: Surgery

## 2015-05-08 VITALS — BP 133/78 | HR 73 | Temp 98.2°F | Resp 16 | Ht 68.0 in | Wt 122.0 lb

## 2015-05-08 DIAGNOSIS — I739 Peripheral vascular disease, unspecified: Secondary | ICD-10-CM | POA: Diagnosis not present

## 2015-05-08 DIAGNOSIS — Z5189 Encounter for other specified aftercare: Secondary | ICD-10-CM | POA: Diagnosis not present

## 2015-05-08 NOTE — Progress Notes (Signed)
Patient name: Christopher Pena MRN: [id number] DOB: [birth year] Sex: male     Chief Complaint  Patient presents with  . Re-evaluation    6 mo PVD w/Claudication .  C/O Ongoing bilateral lower leg pain.    HISTORY OF PRESENT ILLNESS: On 11/12/2013, he underwent a right femoral to below knee popliteal artery bypass graft using ipsilateral non-reversed translocated vein. The proximal anastomosis was to the profundofemoral artery, given the length of the vein. The patient had a slight separation in his below knee incision which has been undergoing dressing changes. He has a history of failed bilateral percutaneous revascularization by Dr Allyson Sabal. He developed a recurrence of symptoms and underwent angiography on May 16 2014 which revealed a sclerotic vein graft which was nearly occluded. The patient continues to smoke  When I saw him 6 months ago we had elected to restart Pletal.  He states that after he took his first pill he has some chest pressure and he elected not to take anymore.  Currently he would like to try to restart that.  He can walk approximately 100 yards before he has cramping.  He denies any open wounds.  He denies rest pain.  Past Medical History  Diagnosis Date  . Chronic low back pain     "degenerative spine dx'd ~ 7 yr ago" (06/08/2013)  . COPD (chronic obstructive pulmonary disease)   . Anxiety   . Claudication   . Unexplained weight loss   . Tobacco abuse   . Hypertension     "moderately high; RX didn't help" (06/08/2013)  . PAD (peripheral artery disease), PTA/stent IDEV & chocolate baloon 06/08/13 04/13/2013    Severely reduced ABI's of 0.3 bilaterally   . Non-healing wound of lower extremity 06/09/2013  . Tobacco use 06/09/2013  . Emphysema of lung   . Shortness of breath     w/ exertion  . Neuromuscular disorder     degen spine    Past Surgical History  Procedure Laterality Date  . Abi      04/09/13; abnormal  . Femoral artery stent Right  05/10/13    2 stents Rt SFA  . Femoral artery stent Left 06/08/2013  . Appendectomy  05/2001  . Femoral-popliteal bypass graft Right 11/12/2013    Procedure: RIGHT  FEMORAL-BELOW KNEE POPLITEAL ARTERY BYPASS GRAFT USING NON- REVERSE RIGHT GREATER SAPHENOUS VEIN.;  Surgeon: Nada Libman, MD;  Location: [facility] OR;  Service: Vascular;  Laterality: Right;  . Percutaneous stent intervention Right 05/10/2013    Procedure: PERCUTANEOUS STENT INTERVENTION;  Surgeon: Runell Gess, MD;  [facility] LAB;  Service: Cardiovascular;  Laterality: Right;  rt sfa and popliteal stent x2  . Lower extremity angiogram Bilateral 05/10/2013    Procedure: LOWER EXTREMITY ANGIOGRAM;  Surgeon: Runell Gess, MD;  Location: [facility] CATH LAB;  Service: Cardiovascular;  Laterality: Bilateral;  . Abdominal aortagram  05/10/2013    Procedure: ABDOMINAL AORTAGRAM;  Surgeon: Runell Gess, MD;  Location: [facility] CATH LAB;  Service: Cardiovascular;;  . Lower extremity angiogram N/A 06/08/2013    Procedure: LOWER EXTREMITY ANGIOGRAM;  Surgeon: Runell Gess, MD;  Location: [facility] CATH LAB;  Service: Cardiovascular;  Laterality: N/A;  . Lower extremity angiogram N/A 10/28/2013    Procedure: LOWER EXTREMITY ANGIOGRAM;  Surgeon: Runell Gess, MD;  Location: [facility] CATH LAB;  Service: Cardiovascular;  Laterality: N/A;  . Lower extremity angiogram N/A 05/16/2014    Procedure: LOWER EXTREMITY ANGIOGRAM;  Surgeon: Runell Gess,  MD;  Location: [facility] CATH LAB;  Service: Cardiovascular;  Laterality: N/A;    Social History   Social History  . Marital Status: Divorced    Spouse Name: N/A  . Number of Children: N/A  . Years of Education: N/A   Occupational History  . former golf pro    Social History Main Topics  . Smoking status: Light Tobacco Smoker -- 0.25 packs/day for 50 years    Types: Cigarettes    Start date: 07/03/1963  . Smokeless tobacco: Never Used     Comment: states "trying to quit"  . Alcohol Use: No     Comment:  06/08/2013 "quit 02/2007; drank my share before that though"  . Drug Use: No  . Sexual Activity: Yes   Other Topics Concern  . Not on file   Social History Narrative    Family History  Problem Relation Age of Onset  . Hyperlipidemia Mother   . Heart disease Mother   . Diabetes Mother   . Cancer Father     Liver    Allergies as of 05/08/2015  . (No Known Allergies)    Current Outpatient Prescriptions on File Prior to Visit  Medication Sig Dispense Refill  . ALPRAZolam (XANAX) 1 MG tablet Take 1 mg by mouth daily.     Marland Kitchen aspirin EC 81 MG tablet Take 81 mg by mouth daily.    . clopidogrel (PLAVIX) 75 MG tablet take 1 tablet by mouth once daily 30 tablet 2  . oxyCODONE (ROXICODONE) 15 MG immediate release tablet Take 15 mg by mouth every 4 (four) hours as needed for pain.    . cilostazol (PLETAL) 100 MG tablet Take 1 tablet (100 mg total) by mouth 2 (two) times daily before a meal. (Patient not taking: Reported on 05/08/2015) 60 tablet 10   No current facility-administered medications on file prior to visit.     REVIEW OF SYSTEMS: See history of present illness, otherwise negative   PHYSICAL EXAMINATION:   Vital signs are  Filed Vitals:   05/08/15 1546  BP: 133/78  Pulse: 73  Temp: 98.2 F (36.8 C)  TempSrc: Oral  Resp: 16  Height:  (1.727 m)  Weight: 122 lb (55.339 kg)  SpO2: 100%   Body mass index is 18.55 kg/(m^2). General: The patient appears their stated age. HEENT:  No gross abnormalities Pulmonary:  Non labored breathing Musculoskeletal: There are no major deformities. Neurologic: No focal weakness or paresthesias are detected, Skin: There are no ulcer or rashes noted. Psychiatric: The patient has normal affect. Cardiovascular: Pedal pulses are nonpalpable.  Diagnostic Studies None today  Assessment: #1: Peripheral vascular disease #2: Abdominal aortic aneurysm Plan: #1: The patient is going to start taking 50 mg of Pletal twice a day to see  if he gets any relief from this.  If he does not have any recurrent chest pain type symptoms he will increase the dose. I will repeat his duplex in 6 months   #2: In reviewing his old records he had a abdominal aneurysm screening in 2014 by Dr. Rennis Golden which showed a 3.6 cm infrarenal abdominal aortic aneurysm.  I will have this repeated at his next visit.       Jorge Ny, M.D. [facility] [place] Office: [phone] Pager:  [phone]

## 2015-05-08 NOTE — Addendum Note (Signed)
Addended by: Adria Dill L on: 05/08/2015 05:00 PM   Modules accepted: Orders

## 2015-06-01 DIAGNOSIS — M5417 Radiculopathy, lumbosacral region: Secondary | ICD-10-CM | POA: Diagnosis not present

## 2015-06-01 DIAGNOSIS — G541 Lumbosacral plexus disorders: Secondary | ICD-10-CM | POA: Diagnosis not present

## 2015-06-01 DIAGNOSIS — M79604 Pain in right leg: Secondary | ICD-10-CM | POA: Diagnosis not present

## 2015-06-01 DIAGNOSIS — G8929 Other chronic pain: Secondary | ICD-10-CM | POA: Diagnosis not present

## 2015-06-01 DIAGNOSIS — M79652 Pain in left thigh: Secondary | ICD-10-CM | POA: Diagnosis not present

## 2015-06-01 DIAGNOSIS — G603 Idiopathic progressive neuropathy: Secondary | ICD-10-CM | POA: Diagnosis not present

## 2015-06-29 DIAGNOSIS — M545 Low back pain: Secondary | ICD-10-CM | POA: Diagnosis not present

## 2015-06-29 DIAGNOSIS — G8929 Other chronic pain: Secondary | ICD-10-CM | POA: Diagnosis not present

## 2015-07-28 DIAGNOSIS — Z79891 Long term (current) use of opiate analgesic: Secondary | ICD-10-CM | POA: Diagnosis not present

## 2015-07-28 DIAGNOSIS — M545 Low back pain: Secondary | ICD-10-CM | POA: Diagnosis not present

## 2015-07-28 DIAGNOSIS — M79604 Pain in right leg: Secondary | ICD-10-CM | POA: Diagnosis not present

## 2015-07-28 DIAGNOSIS — M79605 Pain in left leg: Secondary | ICD-10-CM | POA: Diagnosis not present

## 2015-07-28 DIAGNOSIS — G8929 Other chronic pain: Secondary | ICD-10-CM | POA: Diagnosis not present

## 2015-08-23 ENCOUNTER — Other Ambulatory Visit: Payer: Self-pay | Admitting: Cardiovascular Disease

## 2015-08-23 NOTE — Telephone Encounter (Signed)
REFILL 

## 2015-08-24 DIAGNOSIS — M79604 Pain in right leg: Secondary | ICD-10-CM | POA: Diagnosis not present

## 2015-08-24 DIAGNOSIS — M79605 Pain in left leg: Secondary | ICD-10-CM | POA: Diagnosis not present

## 2015-08-24 DIAGNOSIS — M545 Low back pain: Secondary | ICD-10-CM | POA: Diagnosis not present

## 2015-08-24 DIAGNOSIS — G894 Chronic pain syndrome: Secondary | ICD-10-CM | POA: Diagnosis not present

## 2015-09-21 ENCOUNTER — Other Ambulatory Visit: Payer: Self-pay | Admitting: Cardiovascular Disease

## 2015-09-21 DIAGNOSIS — M5431 Sciatica, right side: Secondary | ICD-10-CM | POA: Diagnosis not present

## 2015-09-21 DIAGNOSIS — F1120 Opioid dependence, uncomplicated: Secondary | ICD-10-CM | POA: Diagnosis not present

## 2015-09-21 DIAGNOSIS — M5418 Radiculopathy, sacral and sacrococcygeal region: Secondary | ICD-10-CM | POA: Diagnosis not present

## 2015-09-21 DIAGNOSIS — M5416 Radiculopathy, lumbar region: Secondary | ICD-10-CM | POA: Diagnosis not present

## 2015-09-21 DIAGNOSIS — G894 Chronic pain syndrome: Secondary | ICD-10-CM | POA: Diagnosis not present

## 2015-09-21 DIAGNOSIS — Z79891 Long term (current) use of opiate analgesic: Secondary | ICD-10-CM | POA: Diagnosis not present

## 2015-09-21 DIAGNOSIS — G890 Central pain syndrome: Secondary | ICD-10-CM | POA: Diagnosis not present

## 2015-10-19 DIAGNOSIS — Z79891 Long term (current) use of opiate analgesic: Secondary | ICD-10-CM | POA: Diagnosis not present

## 2015-10-19 DIAGNOSIS — M79605 Pain in left leg: Secondary | ICD-10-CM | POA: Diagnosis not present

## 2015-10-19 DIAGNOSIS — M79604 Pain in right leg: Secondary | ICD-10-CM | POA: Diagnosis not present

## 2015-10-19 DIAGNOSIS — M545 Low back pain: Secondary | ICD-10-CM | POA: Diagnosis not present

## 2015-10-19 DIAGNOSIS — G894 Chronic pain syndrome: Secondary | ICD-10-CM | POA: Diagnosis not present

## 2015-11-16 DIAGNOSIS — M5418 Radiculopathy, sacral and sacrococcygeal region: Secondary | ICD-10-CM | POA: Diagnosis not present

## 2015-11-16 DIAGNOSIS — G890 Central pain syndrome: Secondary | ICD-10-CM | POA: Diagnosis not present

## 2015-11-16 DIAGNOSIS — M5431 Sciatica, right side: Secondary | ICD-10-CM | POA: Diagnosis not present

## 2015-11-16 DIAGNOSIS — M5416 Radiculopathy, lumbar region: Secondary | ICD-10-CM | POA: Diagnosis not present

## 2015-11-17 ENCOUNTER — Encounter: Payer: Self-pay | Admitting: Family

## 2015-11-17 DIAGNOSIS — L03116 Cellulitis of left lower limb: Secondary | ICD-10-CM | POA: Diagnosis not present

## 2015-11-22 ENCOUNTER — Ambulatory Visit (INDEPENDENT_AMBULATORY_CARE_PROVIDER_SITE_OTHER): Payer: Medicare Other | Admitting: Podiatry

## 2015-11-22 ENCOUNTER — Ambulatory Visit (INDEPENDENT_AMBULATORY_CARE_PROVIDER_SITE_OTHER): Payer: Medicare Other

## 2015-11-22 ENCOUNTER — Encounter: Payer: Self-pay | Admitting: Podiatry

## 2015-11-22 ENCOUNTER — Ambulatory Visit: Payer: Self-pay

## 2015-11-22 VITALS — BP 112/86 | HR 107 | Temp 98.2°F | Resp 16 | Ht 68.0 in | Wt 125.0 lb

## 2015-11-22 DIAGNOSIS — M79674 Pain in right toe(s): Secondary | ICD-10-CM | POA: Diagnosis not present

## 2015-11-22 DIAGNOSIS — M79675 Pain in left toe(s): Secondary | ICD-10-CM

## 2015-11-22 DIAGNOSIS — L03012 Cellulitis of left finger: Secondary | ICD-10-CM

## 2015-11-22 DIAGNOSIS — I999 Unspecified disorder of circulatory system: Secondary | ICD-10-CM

## 2015-11-22 MED ORDER — GABAPENTIN 300 MG PO CAPS: 300.0000 mg | ORAL_CAPSULE | Freq: Three times a day (TID) | ORAL | Status: DC

## 2015-11-22 NOTE — Progress Notes (Signed)
   Subjective:    Patient ID: Felicita GageDavid H Millay, male    DOB: 1946/03/24, 70 y.o.   MRN: [id number]  HPI Patient presents with toe pain in their left foot; great toe; swollen; purple looking; oozing. Pt stated, "Went to urgent care on Friday, November 17, 2015 and was told had cellulitis; has constant pain for the past 11 days".  Patient is in a pain management clinic.  Pt was on Clindamycin but got diarrhea from taking. Pt stopped taking antibiotic on Tuesday, November 21, 2015.  Review of Systems  All other systems reviewed and are negative.      Objective:   Physical Exam        Assessment & Plan:

## 2015-11-23 NOTE — Progress Notes (Signed)
Subjective:     Patient ID: Christopher Pena, male   DOB: 03/29/1946, 70 y.o.   MRN: [id number]  HPI patient presents stating that he has a painful left big toe and he has vascular disease and is scheduled to see the doctor on Monday for evaluation   Review of Systems  All other systems reviewed and are negative.      Objective:   Physical Exam  Constitutional: He is oriented to person, place, and time.  Musculoskeletal: Normal range of motion.  Neurological: He is oriented to person, place, and time.  Skin: Skin is dry.  Nursing note and vitals reviewed.  vascular status found to be significantly diminished left over right foot with history of grafting on the right which only lasted for a period of time. Patient's left hallux distal social crusted type tissue damage to the nail plate and localized drainage with no proximal edema erythema or drainage noted currently. Patient has cool toes with minimal digital perfusion     Assessment:     Acute vascular condition that's being evaluated and will need further evaluation with traumatized left hallux nail with pain    Plan:     H&P and x-ray reviewed with patient. I do think that this patient is going to require amputation but it is difficult yet to ascertain as to what level it will be required. I do not believe the left big toe is viable and patient was made aware of this. To try to help him I did numb the big toe which she tolerated well I removed a portion of the distal nailbed I carefully divided prided necrotic tissue did not note any odor or drainage and instructed on soaks and open toed shoes. I then reviewed with him at that he is most likely can require amputation but we do not know yet the level until vascular evaluation on Monday is complete  X-ray the left foot indicated no indication of ostial lysis appears to be strictly soft tissue and circulatory in its orientation

## 2015-11-27 ENCOUNTER — Ambulatory Visit (INDEPENDENT_AMBULATORY_CARE_PROVIDER_SITE_OTHER)
Admission: RE | Admit: 2015-11-27 | Discharge: 2015-11-27 | Disposition: A | Discharge status: Home / Self Care | Payer: Medicare Other | Source: Ambulatory Visit | Attending: Family | Admitting: Family

## 2015-11-27 ENCOUNTER — Encounter: Payer: Self-pay | Admitting: Family

## 2015-11-27 ENCOUNTER — Ambulatory Visit (HOSPITAL_COMMUNITY)
Admission: RE | Admit: 2015-11-27 | Discharge: 2015-11-27 | Disposition: A | Discharge status: Home / Self Care | Payer: Medicare Other | Source: Ambulatory Visit | Attending: Family | Admitting: Family

## 2015-11-27 ENCOUNTER — Ambulatory Visit (INDEPENDENT_AMBULATORY_CARE_PROVIDER_SITE_OTHER): Payer: Medicare Other | Admitting: Family

## 2015-11-27 VITALS — BP 114/86 | HR 56 | Temp 97.0°F | Ht 68.0 in | Wt 121.2 lb

## 2015-11-27 DIAGNOSIS — I719 Aortic aneurysm of unspecified site, without rupture: Secondary | ICD-10-CM | POA: Insufficient documentation

## 2015-11-27 DIAGNOSIS — Z720 Tobacco use: Secondary | ICD-10-CM

## 2015-11-27 DIAGNOSIS — I998 Other disorder of circulatory system: Secondary | ICD-10-CM

## 2015-11-27 DIAGNOSIS — Z5189 Encounter for other specified aftercare: Secondary | ICD-10-CM | POA: Insufficient documentation

## 2015-11-27 DIAGNOSIS — I714 Abdominal aortic aneurysm, without rupture, unspecified: Secondary | ICD-10-CM

## 2015-11-27 DIAGNOSIS — I739 Peripheral vascular disease, unspecified: Secondary | ICD-10-CM

## 2015-11-27 DIAGNOSIS — I779 Disorder of arteries and arterioles, unspecified: Secondary | ICD-10-CM | POA: Diagnosis not present

## 2015-11-27 DIAGNOSIS — I723 Aneurysm of iliac artery: Secondary | ICD-10-CM | POA: Diagnosis not present

## 2015-11-27 DIAGNOSIS — Z95828 Presence of other vascular implants and grafts: Secondary | ICD-10-CM

## 2015-11-27 DIAGNOSIS — I748 Embolism and thrombosis of other arteries: Secondary | ICD-10-CM | POA: Diagnosis not present

## 2015-11-27 DIAGNOSIS — F17200 Nicotine dependence, unspecified, uncomplicated: Secondary | ICD-10-CM

## 2015-11-27 DIAGNOSIS — M79673 Pain in unspecified foot: Secondary | ICD-10-CM

## 2015-11-27 DIAGNOSIS — I10 Essential (primary) hypertension: Secondary | ICD-10-CM | POA: Insufficient documentation

## 2015-11-27 DIAGNOSIS — Z4889 Encounter for other specified surgical aftercare: Secondary | ICD-10-CM

## 2015-11-27 DIAGNOSIS — Z48812 Encounter for surgical aftercare following surgery on the circulatory system: Secondary | ICD-10-CM

## 2015-11-27 NOTE — Patient Instructions (Addendum)
Abdominal Aortic Aneurysm An aneurysm is a weakened or damaged part of an artery wall that bulges from the normal force of blood pumping through the body. An abdominal aortic aneurysm is an aneurysm that occurs in the lower part of the aorta, the main artery of the body.  The major concern with an abdominal aortic aneurysm is that it can enlarge and burst (rupture) or blood can flow between the layers of the wall of the aorta through a tear (aorticdissection). Both of these conditions can cause bleeding inside the body and can be life threatening unless diagnosed and treated promptly. CAUSES  The exact cause of an abdominal aortic aneurysm is unknown. Some contributing factors are:   A hardening of the arteries caused by the buildup of fat and other substances in the lining of a blood vessel (arteriosclerosis).  Inflammation of the walls of an artery (arteritis).   Connective tissue diseases, such as Marfan syndrome.   Abdominal trauma.   An infection, such as syphilis or staphylococcus, in the wall of the aorta (infectious aortitis) caused by bacteria. RISK FACTORS  Risk factors that contribute to an abdominal aortic aneurysm may include:  Age older than 60 years.   High blood pressure (hypertension).  Male gender.  Ethnicity (white race).  Obesity.  Family history of aneurysm (first degree relatives only).  Tobacco use. PREVENTION  The following healthy lifestyle habits may help decrease your risk of abdominal aortic aneurysm:  Quitting smoking. Smoking can raise your blood pressure and cause arteriosclerosis.  Limiting or avoiding alcohol.  Keeping your blood pressure, blood sugar level, and cholesterol levels within normal limits.  Decreasing your salt intake. In somepeople, too much salt can raise blood pressure and increase your risk of abdominal aortic aneurysm.  Eating a diet low in saturated fats and cholesterol.  Increasing your fiber intake by including  whole grains, vegetables, and fruits in your diet. Eating these foods may help lower blood pressure.  Maintaining a healthy weight.  Staying physically active and exercising regularly. SYMPTOMS  The symptoms of abdominal aortic aneurysm may vary depending on the size and rate of growth of the aneurysm.Most grow slowly and do not have any symptoms. When symptoms do occur, they may include:  Pain (abdomen, side, lower back, or groin). The pain may vary in intensity. A sudden onset of severe pain may indicate that the aneurysm has ruptured.  Feeling full after eating only small amounts of food.  Nausea or vomiting or both.  Feeling a pulsating lump in the abdomen.  Feeling faint or passing out. DIAGNOSIS  Since most unruptured abdominal aortic aneurysms have no symptoms, they are often discovered during diagnostic exams for other conditions. An aneurysm may be found during the following procedures:  Ultrasonography (A one-time screening for abdominal aortic aneurysm by ultrasonography is also recommended for all men aged 65-75 years who have ever smoked).  X-ray exams.  A computed tomography (CT).  Magnetic resonance imaging (MRI).  Angiography or arteriography. TREATMENT  Treatment of an abdominal aortic aneurysm depends on the size of your aneurysm, your age, and risk factors for rupture. Medication to control blood pressure and pain may be used to manage aneurysms smaller than 6 cm. Regular monitoring for enlargement may be recommended by your caregiver if:  The aneurysm is 3-4 cm in size (an annual ultrasonography may be recommended).  The aneurysm is 4-4.5 cm in size (an ultrasonography every 6 months may be recommended).  The aneurysm is larger than 4.5 cm in   size (your caregiver may ask that you be examined by a vascular surgeon). If your aneurysm is larger than 6 cm, surgical repair may be recommended. There are two main methods for repair of an aneurysm:   Endovascular  repair (a minimally invasive surgery). This is done most often.  Open repair. This method is used if an endovascular repair is not possible.   This information is not intended to replace advice given to you by your health care provider. Make sure you discuss any questions you have with your health care provider.   Document Released: 05/15/2005 Document Revised: 11/30/2012 Document Reviewed: 09/04/2012 Elsevier Interactive Patient Education 2016 [employer]    Peripheral Vascular Disease Peripheral vascular disease (PVD) is a disease of the blood vessels that are not part of your heart and brain. A simple term for PVD is poor circulation. In most cases, PVD narrows the blood vessels that carry blood from your heart to the rest of your body. This can result in a decreased supply of blood to your arms, legs, and internal organs, like your stomach or kidneys. However, it most often affects a person's lower legs and feet. There are two types of PVD.  Organic PVD. This is the more common type. It is caused by damage to the structure of blood vessels.  Functional PVD. This is caused by conditions that make blood vessels contract and tighten (spasm). Without treatment, PVD tends to get worse over time. PVD can also lead to acute ischemic limb. This is when an arm or limb suddenly has trouble getting enough blood. This is a medical emergency. CAUSES Each type of PVD has many different causes. The most common cause of PVD is buildup of a fatty material (plaque) inside of your arteries (atherosclerosis). Small amounts of plaque can break off from the walls of the blood vessels and become lodged in a smaller artery. This blocks blood flow and can cause acute ischemic limb. Other common causes of PVD include:  Blood clots that form inside of blood vessels.  Injuries to blood vessels.  Diseases that cause inflammation of blood vessels or cause blood vessel spasms.  Health behaviors and health  history that increase your risk of developing PVD. RISK FACTORS  You may have a greater risk of PVD if you:  Have a family history of PVD.  Have certain medical conditions, including:  High cholesterol.  Diabetes.  High blood pressure (hypertension).  Coronary heart disease.  Past problems with blood clots.  Past injury, such as burns or a broken bone. These may have damaged blood vessels in your limbs.  Buerger disease. This is caused by inflamed blood vessels in your hands and feet.  Some forms of arthritis.  Rare birth defects that affect the arteries in your legs.  Use tobacco.  Do not get enough exercise.  Are obese.  Are age 50 or older. SIGNS AND SYMPTOMS  PVD may cause many different symptoms. Your symptoms depend on what part of your body is not getting enough blood. Some common signs and symptoms include:  Cramps in your lower legs. This may be a symptom of poor leg circulation (claudication).  Pain and weakness in your legs while you are physically active that goes away when you rest (intermittent claudication).  Leg pain when at rest.  Leg numbness, tingling, or weakness.  Coldness in a leg or foot, especially when compared with the other leg.  Skin or hair changes. These can include:  Hair loss.  Shiny   skin.  Pale or bluish skin.  Thick toenails.  Inability to get or maintain an erection (erectile dysfunction). People with PVD are more prone to developing ulcers and sores on their toes, feet, or legs. These may take longer than normal to heal. DIAGNOSIS Your health care provider may diagnose PVD from your signs and symptoms. The health care provider will also do a physical exam. You may have tests to find out what is causing your PVD and determine its severity. Tests may include:  Blood pressure recordings from your arms and legs and measurements of the strength of your pulses (pulse volume recordings).  Imaging studies using sound waves to  take pictures of the blood flow through your blood vessels (Doppler ultrasound).  Injecting a dye into your blood vessels before having imaging studies using:  X-rays (angiogram or arteriogram).  Computer-generated X-rays (CT angiogram).  A powerful electromagnetic field and a computer (magnetic resonance angiogram or MRA). TREATMENT Treatment for PVD depends on the cause of your condition and the severity of your symptoms. It also depends on your age. Underlying causes need to be treated and controlled. These include long-lasting (chronic) conditions, such as diabetes, high cholesterol, and high blood pressure. You may need to first try making lifestyle changes and taking medicines. Surgery may be needed if these do not work. Lifestyle changes may include:  Quitting smoking.  Exercising regularly.  Following a low-fat, low-cholesterol diet. Medicines may include:  Blood thinners to prevent blood clots.  Medicines to improve blood flow.  Medicines to improve your blood cholesterol levels. Surgical procedures may include:  A procedure that uses an inflated balloon to open a blocked artery and improve blood flow (angioplasty).  A procedure to put in a tube (stent) to keep a blocked artery open (stent implant).  Surgery to reroute blood flow around a blocked artery (peripheral bypass surgery).  Surgery to remove dead tissue from an infected wound on the affected limb.  Amputation. This is surgical removal of the affected limb. This may be necessary in cases of acute ischemic limb that are not improved through medical or surgical treatments. HOME CARE INSTRUCTIONS  Take medicines only as directed by your health care provider.  Do not use any tobacco products, including cigarettes, chewing tobacco, or electronic cigarettes. If you need help quitting, ask your health care provider.  Lose weight if you are overweight, and maintain a healthy weight as directed by your health care  provider.  Eat a diet that is low in fat and cholesterol. If you need help, ask your health care provider.  Exercise regularly. Ask your health care provider to suggest some good activities for you.  Use compression stockings or other mechanical devices as directed by your health care provider.  Take good care of your feet.  Wear comfortable shoes that fit well.  Check your feet often for any cuts or sores. SEEK MEDICAL CARE IF:  You have cramps in your legs while walking.  You have leg pain when you are at rest.  You have coldness in a leg or foot.  Your skin changes.  You have erectile dysfunction.  You have cuts or sores on your feet that are not healing. SEEK IMMEDIATE MEDICAL CARE IF:  Your arm or leg turns cold and blue.  Your arms or legs become red, warm, swollen, painful, or numb.  You have chest pain or trouble breathing.  You suddenly have weakness in your face, arm, or leg.  You become very confused or   lose the ability to speak.  You suddenly have a very bad headache or lose your vision.   This information is not intended to replace advice given to you by your health care provider. Make sure you discuss any questions you have with your health care provider.   Document Released: 09/12/2004 Document Revised: 08/26/2014 Document Reviewed: 01/13/2014 Elsevier Interactive Patient Education 2016 [employer]    Steps to Quit Smoking  Smoking tobacco can be harmful to your health and can affect almost every organ in your body. Smoking puts you, and those around you, at risk for developing many serious chronic diseases. Quitting smoking is difficult, but it is one of the best things that you can do for your health. It is never too late to quit. WHAT ARE THE BENEFITS OF QUITTING SMOKING? When you quit smoking, you lower your risk of developing serious diseases and conditions, such as:  Lung cancer or lung disease, such as COPD.  Heart  disease.  Stroke.  Heart attack.  Infertility.  Osteoporosis and bone fractures. Additionally, symptoms such as coughing, wheezing, and shortness of breath may get better when you quit. You may also find that you get sick less often because your body is stronger at fighting off colds and infections. If you are pregnant, quitting smoking can help to reduce your chances of having a baby of low birth weight. HOW DO I GET READY TO QUIT? When you decide to quit smoking, create a plan to make sure that you are successful. Before you quit:  Pick a date to quit. Set a date within the next two weeks to give you time to prepare.  Write down the reasons why you are quitting. Keep this list in places where you will see it often, such as on your bathroom mirror or in your car or wallet.  Identify the people, places, things, and activities that make you want to smoke (triggers) and avoid them. Make sure to take these actions:  Throw away all cigarettes at home, at work, and in your car.  Throw away smoking accessories, such as ashtrays and lighters.  Clean your car and make sure to empty the ashtray.  Clean your home, including curtains and carpets.  Tell your family, friends, and coworkers that you are quitting. Support from your loved ones can make quitting easier.  Talk with your health care provider about your options for quitting smoking.  Find out what treatment options are covered by your health insurance. WHAT STRATEGIES CAN I USE TO QUIT SMOKING?  Talk with your healthcare provider about different strategies to quit smoking. Some strategies include:  Quitting smoking altogether instead of gradually lessening how much you smoke over a period of time. Research shows that quitting "cold turkey" is more successful than gradually quitting.  Attending in-person counseling to help you build problem-solving skills. You are more likely to have success in quitting if you attend several  counseling sessions. Even short sessions of 10 minutes can be effective.  Finding resources and support systems that can help you to quit smoking and remain smoke-free after you quit. These resources are most helpful when you use them often. They can include:  Online chats with a counselor.  Telephone quitlines.  Printed self-help materials.  Support groups or group counseling.  Text messaging programs.  Mobile phone applications.  Taking medicines to help you quit smoking. (If you are pregnant or breastfeeding, talk with your health care provider first.) Some medicines contain nicotine and some do   not. Both types of medicines help with cravings, but the medicines that include nicotine help to relieve withdrawal symptoms. Your health care provider may recommend:  Nicotine patches, gum, or lozenges.  Nicotine inhalers or sprays.  Non-nicotine medicine that is taken by mouth. Talk with your health care provider about combining strategies, such as taking medicines while you are also receiving in-person counseling. Using these two strategies together makes you more likely to succeed in quitting than if you used either strategy on its own. If you are pregnant or breastfeeding, talk with your health care provider about finding counseling or other support strategies to quit smoking. Do not take medicine to help you quit smoking unless told to do so by your health care provider. WHAT THINGS CAN I DO TO MAKE IT EASIER TO QUIT? Quitting smoking might feel overwhelming at first, but there is a lot that you can do to make it easier. Take these important actions:  Reach out to your family and friends and ask that they support and encourage you during this time. Call telephone quitlines, reach out to support groups, or work with a counselor for support.  Ask people who smoke to avoid smoking around you.  Avoid places that trigger you to smoke, such as bars, parties, or smoke-break areas at  work.  Spend time around people who do not smoke.  Lessen stress in your life, because stress can be a smoking trigger for some people. To lessen stress, try:  Exercising regularly.  Deep-breathing exercises.  Yoga.  Meditating.  Performing a body scan. This involves closing your eyes, scanning your body from head to toe, and noticing which parts of your body are particularly tense. Purposefully relax the muscles in those areas.  Download or purchase mobile phone or tablet apps (applications) that can help you stick to your quit plan by providing reminders, tips, and encouragement. There are many free apps, such as QuitGuide from the CDC (Centers for Disease Control and Prevention). You can find other support for quitting smoking (smoking cessation) through smokefree.gov and other websites. HOW WILL I FEEL WHEN I QUIT SMOKING? Within the first 24 hours of quitting smoking, you may start to feel some withdrawal symptoms. These symptoms are usually most noticeable 2-3 days after quitting, but they usually do not last beyond 2-3 weeks. Changes or symptoms that you might experience include:  Mood swings.  Restlessness, anxiety, or irritation.  Difficulty concentrating.  Dizziness.  Strong cravings for sugary foods in addition to nicotine.  Mild weight gain.  Constipation.  Nausea.  Coughing or a sore throat.  Changes in how your medicines work in your body.  A depressed mood.  Difficulty sleeping (insomnia). After the first 2-3 weeks of quitting, you may start to notice more positive results, such as:  Improved sense of smell and taste.  Decreased coughing and sore throat.  Slower heart rate.  Lower blood pressure.  Clearer skin.  The ability to breathe more easily.  Fewer sick days. Quitting smoking is very challenging for most people. Do not get discouraged if you are not successful the first time. Some people need to make many attempts to quit before they  achieve long-term success. Do your best to stick to your quit plan, and talk with your health care provider if you have any questions or concerns.   This information is not intended to replace advice given to you by your health care provider. Make sure you discuss any questions you have with your health care   provider.   Document Released: 07/30/2001 Document Revised: 12/20/2014 Document Reviewed: 12/20/2014 Elsevier Interactive Patient Education 2016 [employer]  

## 2015-11-27 NOTE — Progress Notes (Signed)
[facility] [place] HISTORY AND PHYSICAL -PAD  History of Present Illness Christopher Pena is a 70 y.o. male patient of Dr. Myra Gianotti who is s/p right femoral to below the knee popliteal artery bypass graft using ipsilateral non-reversed translocated vein on 11/12/2013. He reports constant pain started in left foot since about 2 1/2 weeks ago; before 2 1/2 weeks ago left foot pain had been rare. The ulcer on the tip of his left great toe has been present since about 2012 or 2013.  He denies fever or chills at home.  Since the right leg bypass surgery he has had no claudication pain in right calf as he did pre-op. He has no appetite, but denies post prandial abdominal pain.  He denies abdominal pain, denies any new back pain, he does have chronic back pain and attends a pain management clinic. The analgesics he takes for his back helps slightly to dull the constant pain in his left foot, but not the knife like pain in the left foot. He denies personal or family history of aneurysms.  Pt Diabetic: no Pt smoker: smoker (1/4 ppd, started 50 years ago)  Pt meds include: Statin :No ASA: Yes Other anticoagulants/antiplatelets: no, ran out of Plavix about a month ago, prescribed by Dr. Allyson Sabal     Past Medical History  Diagnosis Date  . Chronic low back pain     "degenerative spine dx'd ~ 7 yr ago" (06/08/2013)  . COPD (chronic obstructive pulmonary disease) (HCC)   . Anxiety   . Claudication (HCC)   . Unexplained weight loss   . Tobacco abuse   . Hypertension     "moderately high; RX didn't help" (06/08/2013)  . PAD (peripheral artery disease), PTA/stent IDEV & chocolate baloon 06/08/13 04/13/2013    Severely reduced ABI's of 0.3 bilaterally   . Non-healing wound of lower extremity 06/09/2013  . Tobacco use 06/09/2013  . Emphysema of lung (HCC)   . Shortness of breath     w/ exertion  . Neuromuscular disorder [facility])     degen spine    Social History Social  History  Substance Use Topics  . Smoking status: Light Tobacco Smoker -- 0.25 packs/day for 50 years    Types: Cigarettes    Start date: 07/03/1963  . Smokeless tobacco: Never Used     Comment: states "trying to quit"  . Alcohol Use: No     Comment: 06/08/2013 "quit 02/2007; drank my share before that though"    Family History Family History  Problem Relation Age of Onset  . Hyperlipidemia Mother   . Heart disease Mother   . Diabetes Mother   . Cancer Father     Liver    Past Surgical History  Procedure Laterality Date  . Abi      04/09/13; abnormal  . Femoral artery stent Right 05/10/13    2 stents Rt SFA  . Femoral artery stent Left 06/08/2013  . Appendectomy  05/2001  . Femoral-popliteal bypass graft Right 11/12/2013    Procedure: RIGHT  FEMORAL-BELOW KNEE POPLITEAL ARTERY BYPASS GRAFT USING NON- REVERSE RIGHT GREATER SAPHENOUS VEIN.;  Surgeon: Nada Libman, MD;  Location: [facility] OR;  Service: Vascular;  Laterality: Right;  . Percutaneous stent intervention Right 05/10/2013    Procedure: PERCUTANEOUS STENT INTERVENTION;  Surgeon: Runell Gess, MD;  Location: [facility] CATH LAB;  Service: Cardiovascular;  Laterality: Right;  rt sfa and popliteal stent x2  . Lower extremity angiogram Bilateral 05/10/2013  Procedure: LOWER EXTREMITY ANGIOGRAM;  Surgeon: Runell Gess, MD;  Location: [facility] CATH LAB;  Service: Cardiovascular;  Laterality: Bilateral;  . Abdominal aortagram  05/10/2013    Procedure: ABDOMINAL AORTAGRAM;  Surgeon: Runell Gess, MD;  Location: [facility] CATH LAB;  Service: Cardiovascular;;  . Lower extremity angiogram N/A 06/08/2013    Procedure: LOWER EXTREMITY ANGIOGRAM;  Surgeon: Runell Gess, MD;  Location: [facility] CATH LAB;  Service: Cardiovascular;  Laterality: N/A;  . Lower extremity angiogram N/A 10/28/2013    Procedure: LOWER EXTREMITY ANGIOGRAM;  Surgeon: Runell Gess, MD;  Location: [facility] CATH LAB;  Service: Cardiovascular;  Laterality: N/A;  . Lower extremity  angiogram N/A 05/16/2014    Procedure: LOWER EXTREMITY ANGIOGRAM;  Surgeon: Runell Gess, MD;  Location: [facility] CATH LAB;  Service: Cardiovascular;  Laterality: N/A;    Allergies  Allergen Reactions  . Clindamycin/Lincomycin Diarrhea    Current Outpatient Prescriptions  Medication Sig Dispense Refill  . aspirin EC 81 MG tablet Take 81 mg by mouth daily.    Marland Kitchen gabapentin (NEURONTIN) 300 MG capsule Take 1 capsule (300 mg total) by mouth 3 (three) times daily. 90 capsule 3  . oxyCODONE (ROXICODONE) 15 MG immediate release tablet Take 15 mg by mouth every 4 (four) hours as needed for pain.    Marland Kitchen clopidogrel (PLAVIX) 75 MG tablet take 1 tablet by mouth once daily (Patient not taking: Reported on 11/27/2015) 30 tablet 0   No current facility-administered medications for this visit.    ROS: See HPI for pertinent positives and negatives.   Physical Examination  Filed Vitals:   11/27/15 1038  BP: 114/86  Pulse: 56  Temp: 97 F (36.1 C)  TempSrc: Oral  Height:  (1.727 m)  Weight: 121 lb 3.2 oz (54.976 kg)  SpO2: 98%   Body mass index is 18.43 kg/(m^2).  General: A&O x 3, WDWN. Thin male. Gait: normal Pulmonary: CTAB, without wheezes , rales or rhonchi. Cardiac: regular rhythm, no detected murmur.     Carotid Bruits Left Right   Negative Negative  Aorta is palpable. Radial pulses: are 2+ palpable and =   VASCULAR EXAM: Extremities with ischemic changes: tip of left great toe and medial edge of left great ingrown toenail are dark; distal 2/3 of left foot with dependent rubor. Right leg incisions are well healed.       LE Pulses LEFT RIGHT   FEMORAL  palpable  palpable    POPLITEAL not palpable  not palpable   POSTERIOR TIBIAL  not palpable  not palpable     DORSALIS PEDIS  ANTERIOR TIBIAL not palpable  not palpable    Abdomen: soft, NT, no palpable masses. Skin: no rashes, no ulcers noted other than noted above. Musculoskeletal: no muscle wasting or atrophy. Neurologic: A&O X 3; Appropriate Affect ; SENSATION: normal; MOTOR FUNCTION: moving all extremities equally, motor strength 5/5 throughout. Speech is fluent/normal. CN 2-12 grossly intact.          Non-Invasive Vascular Imaging: DATE: 11/27/2015   ABDOMINAL AORTA DUPLEX EVALUATION    INDICATION: Follow up aortic aneurysm    PREVIOUS INTERVENTION(S): None    DUPLEX EXAM:     LOCATION DIAMETER AP (cm) DIAMETER TRANSVERSE (cm) VELOCITIES (cm/sec)  Aorta Proximal 2.4 1.9 34  Aorta Mid 3.6 3.6 36  Aorta Distal 2.5 2.6 18  Right Common Iliac Artery 1.7 2.0 27  Left Common Iliac Artery 1.4 1.5 27    Previous max aortic diameter:  3.6 Date: 2014  outside exam     ADDITIONAL FINDINGS: Plaque noted within mid to distal aorta    IMPRESSION: 1. 3.6 x 3.6 mid aortic aneurysm with bilateral common iliac artery aneurysm.    Compared to the previous exam:  No prior exam performed here       LOWER EXTREMITY ARTERIAL DUPLEX EVALUATION    INDICATION: Left great toe ulcer, rest pain of left foot    PREVIOUS INTERVENTION(S): History of bilateral stents, Known occlusion of left stent as well as the  femoral/popliteal artery. Right profunda to popliteal bypass graft with saphenous vein 11/12/2013    DUPLEX EXAM:     RIGHT  LEFT   Peak Systolic Velocity (cm/s) Ratio (if abnormal) Waveform  Peak Systolic Velocity (cm/s) Ratio (if abnormal) Waveform  180  T Inflow Artery See    112  T Proximal Anastomosis Attached    62  T Proximal Graft     80  M Mid Graft     63  M  Distal Graft     38  M Distal Anastomosis     90  M Outflow Artery     .69 Today's ABI / TBI .74  .80 Previous ABI / TBI ( 04/21/14 ) .35    Waveform:    M - Monophasic       B - Biphasic       T -  Triphasic  If Ankle Brachial Index (ABI) or Toe Brachial Index (TBI) performed, please see complete report     ADDITIONAL FINDINGS:     IMPRESSION: 1. The right profunda to popliteal bypass graft is small and tortuous, unable to visualize the mid to distal segment of the graft in its entirety,  2. Known left superficial femoral artery occlusion proximally through to the popliteal artery.  Collaterals visualized in the distal femoral artery. Flow appears to be by way of the profunda artery    Compared to the previous exam:  No prior exam     05/16/14 Angiography by Dr. Allyson SabalBerry: Angiographic Data:   1: Abdominal aortogram-the distal abdominal aorta was free of significant disease  2: Left lower extremity-the left common and external iliac arteries were free of significant disease. The left SFA was occluded at its origin and did not not reconstitute until the P3 segments by profunda femoris collaterals. The entire stented segment and segment proximal to that were occluded there was two-vessel runoff.  3: Right lower extremity-the right SFA was occluded. The right profunda popliteal bypass graft had developed progressive disease in the distal third with an 80% pre-anastomotic lesion. There were tandem 80% lesion just proximal to this. There is three-vessel runoff.  IMPRESSION:Mr. Dobberstein has had occlusion of his left SFA with an ABI of 0.3 range. I do not believe he has a percutaneous options for revascularization on the side. His right profunda femoris bypass graft has had progressive progression of disease don't need to further evaluate. The sheath was removed and pressure was held on the groin to achieve hemostasis. The patient left the lab in stable condition. He'll be gently hydrated and discharged home. I will see him back in the office in one to 2 weeks for followup.   06/21/14 office visit assessment by Dr. Allyson SabalBerry: PAD (peripheral artery disease), PTA/stent IDEV & chocolate baloon 06/08/13,  Previous PTA/Stent to Rt SFA 05/10/13 Mr. Floyde Parkinsullin has severe PAD. He has occluded the stent in his right SFA and underwent subsequent right profunda to below the knee popliteal bypass grafting by  Dr. Myra Gianotti 11/12/13. He is also occluded his left SFA. Angiography which I performed 05/16/14 revealed a functionally occluded profunda to below the knee pop bypass graft and occluded left SFA. He does have Rutherford classic for claudication. He continues to smoke. I do not think he has any further percutaneous or surgical options for revascularization.    ASSESSMENT: Christopher Pena is a 70 y.o. male who is s/p right femoral to below the knee popliteal artery bypass graft using ipsilateral non-reversed translocated vein on 11/12/2013.  Pt reports left foot rest pain suddenly started on a Friday 2 1/2 weeks ago accompanied by rubor in the distal 2/3 of left foot, and great toe dark discoloration seems enlarged compared to previous visit seven months ago. Dry ulcer at tip of left great toe enlarged since the surgery, but he has had this ulcer since 2012 or 2013.  Pt ran out of Plavix a month ago. He does not have DM but continues to smoke for 50+ years.  Dr. Myra Gianotti performed the right femoral to below the knee popliteal artery bypass graft using ipsilateral non-reversed translocated vein on 11/12/2013. Pt states Dr. Allyson Sabal inserted bilateral SFA stents.  Today's left LE arterial duplex suggests the right profunda to popliteal bypass graft is small and tortuous, unable to visualize the mid to distal segment of the graft in its entirety,  Known left superficial femoral artery occlusion proximally through to the popliteal artery.  Collaterals visualized in the distal femoral artery. Flow appears to be by way of the profunda artery. All monophasic waveforms in ABI's.  See above angiography results and subsequent office visit with Dr. Allyson Sabal.  Since the right leg bypass surgery he has had no claudication pain in  right calf as he did pre-op.  Small AAA on abdominal duplex today with largest diameter of 3.6 cm, unchanged in comparison to outlying facility exam in 2014.  Carotid duplex from 11/08/13 suggested <40% bilateral ICA stenoses.   PLAN:  Based on the patient's vascular studies and examination, pt will return to clinic on 12/05/15 to discuss with Dr. Myra Gianotti how to manage sudden onset left foot rest pain.  The patient was counseled re smoking cessation and given several free resources re smoking cessation.  I discussed in depth with the patient the nature of atherosclerosis, and emphasized the importance of maximal medical management including strict control of blood pressure, blood glucose, and lipid levels, obtaining regular exercise, and cessation of smoking.  The patient is aware that without maximal medical management the underlying atherosclerotic disease process will progress, limiting the benefit of any interventions.  The patient was given information about PAD including signs, symptoms, treatment, what symptoms should prompt the patient to seek immediate medical care, and risk reduction measures to take.  Charisse March, RN, MSN, FNP-C [facility] [place] Phone: [phone]  Clinic MD: Hart Rochester  11/27/2015 11:01 AM

## 2015-11-29 ENCOUNTER — Encounter: Payer: Self-pay | Admitting: Surgery

## 2015-12-04 ENCOUNTER — Other Ambulatory Visit: Payer: Self-pay

## 2015-12-04 ENCOUNTER — Encounter: Payer: Self-pay | Admitting: Surgery

## 2015-12-04 ENCOUNTER — Ambulatory Visit (INDEPENDENT_AMBULATORY_CARE_PROVIDER_SITE_OTHER): Payer: Medicare Other | Admitting: Surgery

## 2015-12-04 VITALS — BP 101/66 | HR 108 | Temp 98.2°F | Resp 22 | Ht 68.0 in | Wt 117.0 lb

## 2015-12-04 DIAGNOSIS — I70245 Atherosclerosis of native arteries of left leg with ulceration of other part of foot: Secondary | ICD-10-CM | POA: Diagnosis not present

## 2015-12-04 MED ORDER — LEVOFLOXACIN 500 MG PO TABS: 500.0000 mg | ORAL_TABLET | Freq: Every day | ORAL | Status: DC

## 2015-12-04 NOTE — Progress Notes (Signed)
[facility]  Patient name: Christopher Pena MRN: [id number] DOB: 26-Jun-1946 Sex: male  REASON FOR VISIT: Left great toe wound and pain  HPI: Christopher Pena is a 70 y.o. male who presents for evaluation of left great toe ulcer and left foot pain. He is status post right femoral to below-knee popliteal bypass with ipsilateral non-reversed translocated vein by Dr. Myra Gianotti on 11/12/2013. He previously underwent bilateral percutaneous revascularization by Dr. Allyson Sabal.   He states that he had a blood blister on his left great toe approximately 4 years ago. He "opened up" the blister at home and since then has been having issues with it healing. He recently went to a podiatrist two weeks ago and did some toe nail trimming. He states that his left great toe has had dark darkening since then. He states that his left foot has been very painful and he is unable to sleep at night. He states that elevating his legs to help with the swelling in his foot that causes him great pain. Dangling the foot down helps with the pain.  The patient has claudication after ambulating 5200 feet on the right. He states that he is able to tolerate this. His greatest concern is his left foot. He continues to smoke.  He was previously on Plavix but ran out of refills 2 months ago. He was advised to see Dr. Allyson Sabal, but the patient did not follow-up. He takes a daily aspirin, he continues to smoke.  He has a past medical history of chronic low back pain being followed at the pain clinic. He has hypertension not medically managed. He has a history of COPD.   Past Medical History  Diagnosis Date  . Chronic low back pain     "degenerative spine dx'd ~ 7 yr ago" (06/08/2013)  . COPD (chronic obstructive pulmonary disease) (HCC)   . Anxiety   . Claudication (HCC)   . Unexplained weight loss   . Tobacco abuse   . Hypertension     "moderately high; RX didn't help" (06/08/2013)  . PAD (peripheral  artery disease), PTA/stent IDEV & chocolate baloon 06/08/13 04/13/2013    Severely reduced ABI's of 0.3 bilaterally   . Non-healing wound of lower extremity 06/09/2013  . Tobacco use 06/09/2013  . Emphysema of lung (HCC)   . Shortness of breath     w/ exertion  . Neuromuscular disorder (HCC)     degen spine    Family History  Problem Relation Age of Onset  . Hyperlipidemia Mother   . Heart disease Mother   . Diabetes Mother   . Cancer Father     Liver    SOCIAL HISTORY: Social History  Substance Use Topics  . Smoking status: Light Tobacco Smoker -- 0.25 packs/day for 50 years    Types: Cigarettes    Start date: 07/03/1963  . Smokeless tobacco: Never Used     Comment: states "trying to quit"  . Alcohol Use: No     Comment: 06/08/2013 "quit 02/2007; drank my share before that though"    Allergies  Allergen Reactions  . Clindamycin/Lincomycin Diarrhea    Current Outpatient Prescriptions  Medication Sig Dispense Refill  . aspirin EC 81 MG tablet Take 81 mg by mouth daily.    Marland Kitchen gabapentin (NEURONTIN) 300 MG capsule Take 1 capsule (300 mg total) by mouth 3 (three) times daily. 90 capsule 3  . oxyCODONE (ROXICODONE) 15 MG immediate release tablet Take 15 mg by mouth  every 4 (four) hours as needed for pain.    Marland Kitchen. clopidogrel (PLAVIX) 75 MG tablet take 1 tablet by mouth once daily (Patient not taking: Reported on 12/04/2015) 30 tablet 0  . levofloxacin (LEVAQUIN) 500 MG tablet Take 1 tablet (500 mg total) by mouth daily. 14 tablet 0   No current facility-administered medications for this visit.    REVIEW OF SYSTEMS:  [X]  denotes positive finding, [ ]  denotes negative finding Cardiac  Comments:  Chest pain or chest pressure:    Shortness of breath upon exertion:    Short of breath when lying flat:    Irregular heart rhythm:        Vascular    Pain in calf, thigh, or hip brought on by ambulation: x   Pain in feet at night that wakes you up from your sleep:  x   Blood clot  in your veins:    Leg swelling:         Pulmonary    Oxygen at home:    Productive cough:     Wheezing:         Neurologic    Sudden weakness in arms or legs:     Sudden numbness in arms or legs:     Sudden onset of difficulty speaking or slurred speech:    Temporary loss of vision in one eye:     Problems with dizziness:         Gastrointestinal    Blood in stool:     Vomited blood:         Genitourinary    Burning when urinating:     Blood in urine:        Psychiatric    Major depression:         Hematologic    Bleeding problems:    Problems with blood clotting too easily:        Skin    Rashes or ulcers:        Constitutional    Fever or chills:      PHYSICAL EXAM: Filed Vitals:   12/04/15 1206  BP: 101/66  Pulse: 108  Temp: 98.2 F (36.8 C)  TempSrc: Oral  Resp: 22  Height: 5\' 8"  (1.727 m)  Weight: 117 lb (53.071 kg)  SpO2: 96%    GENERAL: The patient is a thin male who appears older than stated age, in no acute distress. The vital signs are documented above. CARDIAC: There is a regular rate and rhythm. No carotid bruits VASCULAR: 2+ femoral pulses bilaterally. Nonpalpable popliteal pulses. Nonpalpable pedal pulses. PULMONARY: There is good air exchange bilaterally. Mild expiratory wheeze bilaterally. ABDOMEN: Soft and non-tender with normal pitched bowel sounds.  MUSCULOSKELETAL: Erythematous, swollen left great toe with ulceration and dry gangrenous changes at tip of toe. No drainage seen. NEUROLOGIC: No focal weakness or paresthesias are detected. SKIN: There are no ulcers or rashes noted. PSYCHIATRIC: The patient has a normal affect.  DATA:  ABIs 11/27/2015  Right: 0.69 with monophasic peroneal, posterior tibial and dorsalis pedis waveforms.  Left: 0.74 monophasic peroneal, posterior tibial and dorsalis pedis waveforms. Unable to obtain TBI due to ulcer. ABIs may be falsely elevated.  Lower extremity arterial duplex for 06/07/2016  Right  profunda to popliteal bypass is small and tortuous the mid to distal segment of the graft was unable to be visualized Known left SFA occlusion. Collateral flow seen from distal femoral artery.  AAA duplex for 06/07/2016  3.6 x 3.6 cm at mid  aorta 1.7 cm right common iliac artery 1.4 cm left common iliac artery  MEDICAL ISSUES:  Left great toe ulceration and left rest pain  The patient has previously undergone left popliteal stenting by Dr. Allyson Sabal in 2015. He now presents with worsening left great toe ulcer and rest pain for the past 2 weeks. This appears infected. We'll prescribe Levaquin. He will need to undergo arteriogram to evaluate for revascularization options. This will be scheduled for 12/06/2015 with Dr. Myra Gianotti. Tentatively, he'll be scheduled for left femoral to tibial bypass on 12/12/2015 with Dr. Myra Gianotti. He'll need to undergo cardiac clearance prior to this. His previous saphenous vein mapping from 2015 reveals an adequate left great saphenous vein conduit. He understands that he is at risk for limb loss. The patient ran out of his Plavix 2 months ago. He has not been back to see Dr. Allyson Sabal for refill. He is on a daily aspirin.    Status post right femoral to below-knee popliteal bypass 11/12/2013 On duplex, this appeared very small and tortuous. Angiography in 2015 by Dr. Allyson Sabal revealed 2 stenotic regions of his bypass. He is currently having claudication symptoms that are tolerable. Fortunately, he does not have any wounds.  AAA  His infrarenal AAA has been stable in size at 3.6 cm. There have been no changes since his last ultrasound in 2014 Dr. Blanchie Dessert office.   Maris Berger, PA-C [facility] [place]   I agree with the above.  I have seen and evaluated the patient.  The patient has a history of a right femoral to below-knee popliteal artery bypass graft.  In 2015.  Despite a vein mapping that showed an excellent vein preoperatively, his vein became  sclerotic postoperatively, documented by angiography.  The patient has developed a wound on his left great toe with erythema and pain.  He has a history of stenting to his left popliteal artery.  This has occluded.  I am placing him on antibiotics for cellulitis.  He needs urgent revascularization of his left leg in order to salvage his foot.  I have scheduled him for angiography this Wednesday.  This will be a right femoral approach with diagnostic images of the left leg.  Next, he will be scheduled for a left femoral-tibial bypass graft on Tuesday, April 25.  He will remain off of his Plavix.  Durene Cal

## 2015-12-05 ENCOUNTER — Other Ambulatory Visit: Payer: Self-pay

## 2015-12-06 ENCOUNTER — Encounter (HOSPITAL_COMMUNITY)
Admission: RE | Disposition: A | Discharge status: Home / Self Care | Payer: Self-pay | Source: Ambulatory Visit | Attending: Surgery

## 2015-12-06 ENCOUNTER — Encounter (HOSPITAL_COMMUNITY): Payer: Self-pay | Admitting: *Deleted

## 2015-12-06 ENCOUNTER — Ambulatory Visit (HOSPITAL_COMMUNITY): Payer: Medicare Other

## 2015-12-06 ENCOUNTER — Ambulatory Visit (HOSPITAL_COMMUNITY)
Admission: RE | Admit: 2015-12-06 | Discharge: 2015-12-06 | Disposition: A | Discharge status: Home / Self Care | Payer: Medicare Other | Source: Ambulatory Visit | Attending: Surgery | Admitting: Surgery

## 2015-12-06 DIAGNOSIS — F17210 Nicotine dependence, cigarettes, uncomplicated: Secondary | ICD-10-CM

## 2015-12-06 DIAGNOSIS — L97529 Non-pressure chronic ulcer of other part of left foot with unspecified severity: Secondary | ICD-10-CM | POA: Insufficient documentation

## 2015-12-06 DIAGNOSIS — M545 Low back pain: Secondary | ICD-10-CM | POA: Diagnosis not present

## 2015-12-06 DIAGNOSIS — I10 Essential (primary) hypertension: Secondary | ICD-10-CM | POA: Diagnosis present

## 2015-12-06 DIAGNOSIS — J449 Chronic obstructive pulmonary disease, unspecified: Secondary | ICD-10-CM

## 2015-12-06 DIAGNOSIS — L03116 Cellulitis of left lower limb: Secondary | ICD-10-CM | POA: Diagnosis not present

## 2015-12-06 DIAGNOSIS — I70245 Atherosclerosis of native arteries of left leg with ulceration of other part of foot: Secondary | ICD-10-CM | POA: Diagnosis not present

## 2015-12-06 DIAGNOSIS — G8929 Other chronic pain: Secondary | ICD-10-CM | POA: Diagnosis not present

## 2015-12-06 DIAGNOSIS — Z7902 Long term (current) use of antithrombotics/antiplatelets: Secondary | ICD-10-CM | POA: Insufficient documentation

## 2015-12-06 DIAGNOSIS — Z79899 Other long term (current) drug therapy: Secondary | ICD-10-CM

## 2015-12-06 DIAGNOSIS — M79675 Pain in left toe(s): Secondary | ICD-10-CM | POA: Diagnosis not present

## 2015-12-06 DIAGNOSIS — Z7982 Long term (current) use of aspirin: Secondary | ICD-10-CM

## 2015-12-06 HISTORY — PX: PERIPHERAL VASCULAR CATHETERIZATION: SHX172C

## 2015-12-06 LAB — SURGICAL PCR SCREEN
MRSA, PCR: NEGATIVE
Staphylococcus aureus: POSITIVE — AB

## 2015-12-06 LAB — POCT I-STAT, CHEM 8
BUN: 22 mg/dL — AB (ref 6–20)
CALCIUM ION: 1.09 mmol/L — AB (ref 1.13–1.30)
CREATININE: 0.8 mg/dL (ref 0.61–1.24)
Chloride: 97 mmol/L — ABNORMAL LOW (ref 101–111)
GLUCOSE: 131 mg/dL — AB (ref 65–99)
HCT: 46 % (ref 39.0–52.0)
Hemoglobin: 15.6 g/dL (ref 13.0–17.0)
Potassium: 4 mmol/L (ref 3.5–5.1)
SODIUM: 140 mmol/L (ref 135–145)
TCO2: 33 mmol/L (ref 0–100)

## 2015-12-06 LAB — URINALYSIS, ROUTINE W REFLEX MICROSCOPIC
Glucose, UA: NEGATIVE mg/dL
KETONES UR: NEGATIVE mg/dL
Leukocytes, UA: NEGATIVE
Nitrite: NEGATIVE
PH: 5 (ref 5.0–8.0)
Protein, ur: 100 mg/dL — AB

## 2015-12-06 LAB — COMPREHENSIVE METABOLIC PANEL
ALBUMIN: 3.4 g/dL — AB (ref 3.5–5.0)
ALT: 12 U/L — ABNORMAL LOW (ref 17–63)
ANION GAP: 13 (ref 5–15)
AST: 25 U/L (ref 15–41)
Alkaline Phosphatase: 46 U/L (ref 38–126)
BILIRUBIN TOTAL: 0.5 mg/dL (ref 0.3–1.2)
BUN: 13 mg/dL (ref 6–20)
CHLORIDE: 98 mmol/L — AB (ref 101–111)
CO2: 27 mmol/L (ref 22–32)
Calcium: 8.5 mg/dL — ABNORMAL LOW (ref 8.9–10.3)
Creatinine, Ser: 0.82 mg/dL (ref 0.61–1.24)
GFR calc Af Amer: >60 mL/min (ref >60)
GLUCOSE: 168 mg/dL — AB (ref 65–99)
POTASSIUM: 3.3 mmol/L — AB (ref 3.5–5.1)
Sodium: 138 mmol/L (ref 135–145)
TOTAL PROTEIN: 6.8 g/dL (ref 6.5–8.1)

## 2015-12-06 LAB — PROTIME-INR
INR: 1.11 (ref 0.00–1.49)
PROTHROMBIN TIME: 14.5 s (ref 11.6–15.2)

## 2015-12-06 LAB — TYPE AND SCREEN
ABO/RH(D): A NEG
ANTIBODY SCREEN: NEGATIVE

## 2015-12-06 LAB — CBC
HEMATOCRIT: 40.8 % (ref 39.0–52.0)
Hemoglobin: 14 g/dL (ref 13.0–17.0)
MCH: 33 pg (ref 26.0–34.0)
MCHC: 34.3 g/dL (ref 30.0–36.0)
MCV: 96.2 fL (ref 78.0–100.0)
PLATELETS: 240 10*3/uL (ref 150–400)
RBC: 4.24 MIL/uL (ref 4.22–5.81)
RDW: 13.6 % (ref 11.5–15.5)
WBC: 7.8 10*3/uL (ref 4.0–10.5)

## 2015-12-06 LAB — URINE MICROSCOPIC-ADD ON: WBC, UA: NONE SEEN WBC/hpf (ref 0–5)

## 2015-12-06 LAB — APTT: APTT: 33 s (ref 24–37)

## 2015-12-06 SURGERY — ABDOMINAL AORTOGRAM W/LOWER EXTREMITY
Anesthesia: LOCAL

## 2015-12-06 MED ORDER — LABETALOL HCL 5 MG/ML IV SOLN: 10.0000 mg | INTRAVENOUS | Status: DC | PRN

## 2015-12-06 MED ORDER — METOPROLOL TARTRATE 1 MG/ML IV SOLN: 2.0000 mg | INTRAVENOUS | Status: DC | PRN

## 2015-12-06 MED ORDER — MIDAZOLAM HCL 2 MG/2ML IJ SOLN
INTRAMUSCULAR | Status: DC | PRN
  Administered 2015-12-06: 1 mg via INTRAVENOUS

## 2015-12-06 MED ORDER — FENTANYL CITRATE (PF) 100 MCG/2ML IJ SOLN
INTRAMUSCULAR | Status: DC | PRN
  Administered 2015-12-06: 25 ug via INTRAVENOUS

## 2015-12-06 MED ORDER — DOCUSATE SODIUM 100 MG PO CAPS: 100.0000 mg | ORAL_CAPSULE | Freq: Every day | ORAL | Status: DC

## 2015-12-06 MED ORDER — OXYCODONE HCL 5 MG PO TABS
5.0000 mg | ORAL_TABLET | ORAL | Status: DC | PRN
  Administered 2015-12-06: 10 mg via ORAL

## 2015-12-06 MED ORDER — MORPHINE SULFATE (PF) 10 MG/ML IV SOLN
2.0000 mg | INTRAVENOUS | Status: DC | PRN
  Administered 2015-12-06: 4 mg via INTRAVENOUS
  Administered 2015-12-06: 2 mg via INTRAVENOUS

## 2015-12-06 MED ORDER — IODIXANOL 320 MG/ML IV SOLN
INTRAVENOUS | Status: DC | PRN
  Administered 2015-12-06: 110 mL via INTRA_ARTERIAL

## 2015-12-06 MED ORDER — OXYCODONE HCL 5 MG PO TABS
ORAL_TABLET | ORAL | Status: AC
  Administered 2015-12-06: 10 mg via ORAL
  Filled 2015-12-06: qty 2

## 2015-12-06 MED ORDER — MORPHINE SULFATE (PF) 2 MG/ML IV SOLN
INTRAVENOUS | Status: AC
  Filled 2015-12-06: qty 1

## 2015-12-06 MED ORDER — ONDANSETRON HCL 4 MG/2ML IJ SOLN: 4.0000 mg | Freq: Four times a day (QID) | INTRAMUSCULAR | Status: DC | PRN

## 2015-12-06 MED ORDER — SODIUM CHLORIDE 0.9 % IV SOLN
INTRAVENOUS | Status: DC
  Administered 2015-12-06: 10:00:00 via INTRAVENOUS

## 2015-12-06 MED ORDER — FENTANYL CITRATE (PF) 100 MCG/2ML IJ SOLN
INTRAMUSCULAR | Status: AC
  Filled 2015-12-06: qty 2

## 2015-12-06 MED ORDER — MIDAZOLAM HCL 2 MG/2ML IJ SOLN
INTRAMUSCULAR | Status: AC
  Filled 2015-12-06: qty 2

## 2015-12-06 MED ORDER — HEPARIN (PORCINE) IN NACL 2-0.9 UNIT/ML-% IJ SOLN
INTRAMUSCULAR | Status: DC | PRN
  Administered 2015-12-06: 1000 mL via INTRA_ARTERIAL

## 2015-12-06 MED ORDER — ALUM & MAG HYDROXIDE-SIMETH 200-200-20 MG/5ML PO SUSP: 15.0000 mL | ORAL | Status: DC | PRN

## 2015-12-06 MED ORDER — ACETAMINOPHEN 325 MG PO TABS: 325.0000 mg | ORAL_TABLET | ORAL | Status: DC | PRN

## 2015-12-06 MED ORDER — LIDOCAINE HCL (PF) 1 % IJ SOLN
INTRAMUSCULAR | Status: AC
  Filled 2015-12-06: qty 30

## 2015-12-06 MED ORDER — ACETAMINOPHEN 325 MG RE SUPP: 325.0000 mg | RECTAL | Status: DC | PRN

## 2015-12-06 MED ORDER — MORPHINE SULFATE (PF) 4 MG/ML IV SOLN
INTRAVENOUS | Status: AC
  Filled 2015-12-06: qty 1

## 2015-12-06 MED ORDER — GUAIFENESIN-DM 100-10 MG/5ML PO SYRP: 15.0000 mL | ORAL_SOLUTION | ORAL | Status: DC | PRN

## 2015-12-06 MED ORDER — HEPARIN (PORCINE) IN NACL 2-0.9 UNIT/ML-% IJ SOLN
INTRAMUSCULAR | Status: AC
  Filled 2015-12-06: qty 1000

## 2015-12-06 MED ORDER — PHENOL 1.4 % MT LIQD
1.0000 | OROMUCOSAL | Status: DC | PRN
Start: 2015-12-06 — End: 2015-12-06

## 2015-12-06 MED ORDER — HYDRALAZINE HCL 20 MG/ML IJ SOLN: 5.0000 mg | INTRAMUSCULAR | Status: DC | PRN

## 2015-12-06 MED ORDER — LIDOCAINE HCL (PF) 1 % IJ SOLN
INTRAMUSCULAR | Status: DC | PRN
  Administered 2015-12-06: 12 mL

## 2015-12-06 MED ORDER — SODIUM CHLORIDE 0.9 % IV SOLN: 1.0000 mL/kg/h | INTRAVENOUS | Status: DC

## 2015-12-06 SURGICAL SUPPLY — 8 items
CATH OMNI FLUSH 5F 65CM (CATHETERS) ×2 IMPLANT
KIT MICROINTRODUCER STIFF 5F (SHEATH) ×2 IMPLANT
KIT PV (KITS) ×2 IMPLANT
SHEATH PINNACLE 5F 10CM (SHEATH) ×2 IMPLANT
SYR MEDRAD MARK V 150ML (SYRINGE) ×2 IMPLANT
TRANSDUCER W/STOPCOCK (MISCELLANEOUS) ×2 IMPLANT
TRAY PV CATH (CUSTOM PROCEDURE TRAY) ×2 IMPLANT
WIRE BENTSON .035X145CM (WIRE) ×4 IMPLANT

## 2015-12-06 NOTE — Pre-Procedure Instructions (Addendum)
Christopher BalesDavid H Pena  12/06/2015      [facility] [place] [facility] [address] [facility] [address] [place] [id number] Phone: [phone] Fax: [phone]    Your procedure is scheduled on Friday, December 08, 2015  [facility].M.  Call this number if you have problems the morning of surgery:  [phone]   Remember:  Do not eat food or drink liquids after midnight Thursday, December 07, 2015  Take these medicines the morning of surgery with A SIP OF WATER : aspirin, gabapentin (NEURONTIN), if needed: oxyCODONE (ROXICODONE) for pain  Stop taking vitamins, fish oil, and herbal medications. Do not take any NSAIDs ie: Ibuprofen, Advil, Naproxen, BC and Goody Powder; stop now.  Do not wear jewelry, make-up or nail polish.  Do not wear lotions, powders, or perfumes.  You may not wear deodorant.  Do not shave 48 hours prior to surgery.  Men may shave face and neck.  Do not bring valuables to the hospital.  [facility] is not responsible for any belongings or valuables.  Contacts, dentures or bridgework may not be worn into surgery.  Leave your suitcase in the car.  After surgery it may be brought to your room.  For patients admitted to the hospital, discharge time will be determined by your treatment team.  Patients discharged the day of surgery will not be allowed to drive home.   Name and phone number of your driver:  Special instructions: [place] - Preparing for Surgery  Before surgery, you can play an important role.  Because skin is not sterile, your skin needs to be as free of germs as possible.  You can reduce the number of germs on you skin by washing with CHG (chlorahexidine gluconate) soap before surgery.  CHG is an antiseptic cleaner which kills germs and bonds with the skin to continue killing germs even after washing.  Please DO NOT use if you have an allergy to CHG or antibacterial soaps.  If  your skin becomes reddened/irritated stop using the CHG and inform your nurse when you arrive at Short Stay.  Do not shave (including legs and underarms) for at least 48 hours prior to the first CHG shower.  You may shave your face.  Please follow these instructions carefully:   1.  Shower with CHG Soap the night before surgery and the morning of Surgery.  2.  If you choose to wash your hair, wash your hair first as usual with your normal shampoo.  3.  After you shampoo, rinse your hair and body thoroughly to remove the Shampoo.  4.  Use CHG as you would any other liquid soap.  You can apply chg directly  to the skin and wash gently with scrungie or a clean washcloth.  5.  Apply the CHG Soap to your body ONLY FROM THE NECK DOWN.  Do not use on open wounds or open sores.  Avoid contact with your eyes, ears, mouth and genitals (private parts).  Wash genitals (private parts) with your normal soap.  6.  Wash thoroughly, paying special attention to the area where your surgery will be performed.  7.  Thoroughly rinse your body with warm water from the neck down.  8.  DO NOT shower/wash with your normal soap after using and rinsing off the CHG Soap.  9.  Pat yourself dry with a clean towel.  10.  Wear clean pajamas.            11.  Place clean sheets on your bed the night of your first shower and do not sleep with pets.  Day of Surgery  Do not apply any lotions/deodorants the morning of surgery.  Please wear clean clothes to the hospital/surgery center.  Please read over the following fact sheets that you were given. Pain Booklet, Coughing and Deep Breathing, Blood Transfusion Information, MRSA Information and Surgical Site Infection Prevention

## 2015-12-06 NOTE — H&P (View-Only) (Signed)
 [facility] [place]  Patient name: Christopher Pena MRN: [id number] DOB: 02/26/1946 Sex: male  REASON FOR VISIT: Left great toe wound and pain  HPI: Christopher Pena is a 70 y.o. male who presents for evaluation of left great toe ulcer and left foot pain. He is status post right femoral to below-knee popliteal bypass with ipsilateral non-reversed translocated vein by Dr. Josselin Gaulin on 11/12/2013. He previously underwent bilateral percutaneous revascularization by Dr. Berry.   He states that he had a blood blister on his left great toe approximately 4 years ago. He "opened up" the blister at home and since then has been having issues with it healing. He recently went to a podiatrist two weeks ago and did some toe nail trimming. He states that his left great toe has had dark darkening since then. He states that his left foot has been very painful and he is unable to sleep at night. He states that elevating his legs to help with the swelling in his foot that causes him great pain. Dangling the foot down helps with the pain.  The patient has claudication after ambulating 5200 feet on the right. He states that he is able to tolerate this. His greatest concern is his left foot. He continues to smoke.  He was previously on Plavix but ran out of refills 2 months ago. He was advised to see Dr. Berry, but the patient did not follow-up. He takes a daily aspirin, he continues to smoke.  He has a past medical history of chronic low back pain being followed at the pain clinic. He has hypertension not medically managed. He has a history of COPD.   Past Medical History  Diagnosis Date  . Chronic low back pain     "degenerative spine dx'd ~ 7 yr ago" (06/08/2013)  . COPD (chronic obstructive pulmonary disease) (HCC)   . Anxiety   . Claudication (HCC)   . Unexplained weight loss   . Tobacco abuse   . Hypertension     "moderately high; RX didn't help" (06/08/2013)  . PAD (peripheral  artery disease), PTA/stent IDEV & chocolate baloon 06/08/13 04/13/2013    Severely reduced ABI's of 0.3 bilaterally   . Non-healing wound of lower extremity 06/09/2013  . Tobacco use 06/09/2013  . Emphysema of lung (HCC)   . Shortness of breath     w/ exertion  . Neuromuscular disorder (HCC)     degen spine    Family History  Problem Relation Age of Onset  . Hyperlipidemia Mother   . Heart disease Mother   . Diabetes Mother   . Cancer Father     Liver    SOCIAL HISTORY: Social History  Substance Use Topics  . Smoking status: Light Tobacco Smoker -- 0.25 packs/day for 50 years    Types: Cigarettes    Start date: 07/03/1963  . Smokeless tobacco: Never Used     Comment: states "trying to quit"  . Alcohol Use: No     Comment: 06/08/2013 "quit 02/2007; drank my share before that though"    Allergies  Allergen Reactions  . Clindamycin/Lincomycin Diarrhea    Current Outpatient Prescriptions  Medication Sig Dispense Refill  . aspirin EC 81 MG tablet Take 81 mg by mouth daily.    . gabapentin (NEURONTIN) 300 MG capsule Take 1 capsule (300 mg total) by mouth 3 (three) times daily. 90 capsule 3  . oxyCODONE (ROXICODONE) 15 MG immediate release tablet Take 15 mg by mouth   every 4 (four) hours as needed for pain.    . clopidogrel (PLAVIX) 75 MG tablet take 1 tablet by mouth once daily (Patient not taking: Reported on 12/04/2015) 30 tablet 0  . levofloxacin (LEVAQUIN) 500 MG tablet Take 1 tablet (500 mg total) by mouth daily. 14 tablet 0   No current facility-administered medications for this visit.    REVIEW OF SYSTEMS:  [X] denotes positive finding, [ ] denotes negative finding Cardiac  Comments:  Chest pain or chest pressure:    Shortness of breath upon exertion:    Short of breath when lying flat:    Irregular heart rhythm:        Vascular    Pain in calf, thigh, or hip brought on by ambulation: x   Pain in feet at night that wakes you up from your sleep:  x   Blood clot  in your veins:    Leg swelling:         Pulmonary    Oxygen at home:    Productive cough:     Wheezing:         Neurologic    Sudden weakness in arms or legs:     Sudden numbness in arms or legs:     Sudden onset of difficulty speaking or slurred speech:    Temporary loss of vision in one eye:     Problems with dizziness:         Gastrointestinal    Blood in stool:     Vomited blood:         Genitourinary    Burning when urinating:     Blood in urine:        Psychiatric    Major depression:         Hematologic    Bleeding problems:    Problems with blood clotting too easily:        Skin    Rashes or ulcers:        Constitutional    Fever or chills:      PHYSICAL EXAM: Filed Vitals:   12/03/68 1206  BP: 101/66  Pulse: 108  Temp: 98.2 F (36.8 C)  TempSrc: Oral  Resp: 22  Height: 5' 8" (1.727 m)  Weight: 117 lb (53.071 kg)  SpO2: 96%    GENERAL: The patient is a thin male who appears older than stated age, in no acute distress. The vital signs are documented above. CARDIAC: There is a regular rate and rhythm. No carotid bruits VASCULAR: 2+ femoral pulses bilaterally. Nonpalpable popliteal pulses. Nonpalpable pedal pulses. PULMONARY: There is good air exchange bilaterally. Mild expiratory wheeze bilaterally. ABDOMEN: Soft and non-tender with normal pitched bowel sounds.  MUSCULOSKELETAL: Erythematous, swollen left great toe with ulceration and dry gangrenous changes at tip of toe. No drainage seen. NEUROLOGIC: No focal weakness or paresthesias are detected. SKIN: There are no ulcers or rashes noted. PSYCHIATRIC: The patient has a normal affect.  DATA:  ABIs 11/27/2015  Right: 0.69 with monophasic peroneal, posterior tibial and dorsalis pedis waveforms.  Left: 0.74 monophasic peroneal, posterior tibial and dorsalis pedis waveforms. Unable to obtain TBI due to ulcer. ABIs may be falsely elevated.  Lower extremity arterial duplex for 06/07/2016  Right  profunda to popliteal bypass is small and tortuous the mid to distal segment of the graft was unable to be visualized Known left SFA occlusion. Collateral flow seen from distal femoral artery.  AAA duplex for 06/07/2016  3.6 x 3.6 cm at mid   aorta 1.7 cm right common iliac artery 1.4 cm left common iliac artery  MEDICAL ISSUES:  Left great toe ulceration and left rest pain  The patient has previously undergone left popliteal stenting by Dr. Berry in 2015. He now presents with worsening left great toe ulcer and rest pain for the past 2 weeks. This appears infected. We'll prescribe Levaquin. He will need to undergo arteriogram to evaluate for revascularization options. This will be scheduled for 12/06/2015 with Dr. Imaad Reuss. Tentatively, he'll be scheduled for left femoral to tibial bypass on 12/12/2015 with Dr. Henessy Rohrer. He'll need to undergo cardiac clearance prior to this. His previous saphenous vein mapping from 2015 reveals an adequate left great saphenous vein conduit. He understands that he is at risk for limb loss. The patient ran out of his Plavix 2 months ago. He has not been back to see Dr. Berry for refill. He is on a daily aspirin.    Status post right femoral to below-knee popliteal bypass 11/12/2013 On duplex, this appeared very small and tortuous. Angiography in 2015 by Dr. Berry revealed 2 stenotic regions of his bypass. He is currently having claudication symptoms that are tolerable. Fortunately, he does not have any wounds.  AAA  His infrarenal AAA has been stable in size at 3.6 cm. There have been no changes since his last ultrasound in 2014 Dr. Hilty's office.   Kimberly Trinh, PA-C [facility] [place]   I agree with the above.  I have seen and evaluated the patient.  The patient has a history of a right femoral to below-knee popliteal artery bypass graft.  In 2015.  Despite a vein mapping that showed an excellent vein preoperatively, his vein became  sclerotic postoperatively, documented by angiography.  The patient has developed a wound on his left great toe with erythema and pain.  He has a history of stenting to his left popliteal artery.  This has occluded.  I am placing him on antibiotics for cellulitis.  He needs urgent revascularization of his left leg in order to salvage his foot.  I have scheduled him for angiography this Wednesday.  This will be a right femoral approach with diagnostic images of the left leg.  Next, he will be scheduled for a left femoral-tibial bypass graft on Tuesday, April 25.  He will remain off of his Plavix.  Wells Khair Chasteen    

## 2015-12-06 NOTE — Interval H&P Note (Signed)
History and Physical Interval Note:  12/06/2015 9:27 AM  Christopher Pena  has presented today for surgery, with the diagnosis of pvd  lt great toe ulcer  The various methods of treatment have been discussed with the patient and family. After consideration of risks, benefits and other options for treatment, the patient has consented to  Procedure(s): Abdominal Aortogram w/Lower Extremity (N/A) as a surgical intervention .  The patient's history has been reviewed, patient examined, no change in status, stable for surgery.  I have reviewed the patient's chart and labs.  Questions were answered to the patient's satisfaction.     [name], Wells

## 2015-12-06 NOTE — Op Note (Addendum)
    Patient name: Christopher GageDavid H Sampey MRN: [id number] DOB: 1945/11/02 Sex: male  12/06/2015 Pre-operative Diagnosis: left leg ulcer Post-operative diagnosis:  Same Surgeon:  [name], Wells Procedure Performed:  1.  Ultrasound-guided access, right femoral artery  2.  abdominal aortogram  3.  Bilateral lower extremity runoff  4.  Second order catheterization  5.  Conscious sedation (10:23-10:54)     Indications:  Patient has a history of bilateral percutaneous interventions and right femoral-popliteal bypass graft.  He developed a wound on his left toe and comes in today for further evaluation  Procedure:  The patient was identified in the holding area and taken to room 8.  The patient was then placed supine on the table and prepped and draped in the usual sterile fashion.  A time out was called.  cconscious sedation was performed with the use of IV fentanyl and Versed under continuous position and nurse monitoring.  Heart rate blood pressur, and oxygen saturation levels were continuously monitored.  I was present for the duration of the procedure.Ultrasound was used to evaluate the right common femoral artery.  It was patent .  A digital ultrasound image was acquired.  A micropuncture needle was used to access the right common femoral artery under ultrasound guidance.  An 018 wire was advanced without resistance and a micropuncture sheath was placed.  The 018 wire was removed and a benson wire was placed.  The micropuncture sheath was exchanged for a 5 french sheath.  An omniflush catheter was advanced over the wire to the level of L-1.  An abdominal angiogram was obtained.  Next, using the omniflush catheter and a benson wire, the aortic bifurcation was crossed and the catheter was placed into theleft external iliac artery and left runoff was obtained.  right runoff was performed via retrograde sheath injections.  Findings:   Aortogram:  No significant renal artery stenosis.  Bilateral iliac arteries  widely patent  Right Lower Extremity:  Right common femoral profunda femoral are widely patent.  The superficial femoral artery is occluded.  There is reconstitution of the below-knee popliteal artery with three-vessel runoff.  Left Lower Extremity:  Left common femoral and profunda femoral artery are patent throughout their course.  The superficial femoral and popliteal artery are occluded.  There is two-vessel runoff via the peroneal and anterior tibial artery.  The dominant vessel his anterior tibial artery  Intervention:  none  Impression:  #1  Occluded right superficial femoral and popliteal artery  #2  occluded left superficial femoral and popliteal artery  #3  The patient will be scheduled for a left femoral to anterior tibial bypass graft   V. Durene CalWells Brabham, M.D. [facility] [place] Office: [phone] Pager:  [phone]

## 2015-12-06 NOTE — Progress Notes (Signed)
Site area: RFA Site Prior to Removal:  Level 0 Pressure Applied For:20 min Manual:   yes Patient Status During Pull:  stable Post Pull Site:  Level 0 Post Pull Instructions Given:  yes Post Pull Pulses Present: doppler Dressing Applied:  tegaderm Bedrest begins @ 1150 [phone] Comments:

## 2015-12-06 NOTE — Progress Notes (Signed)
Took over the last 5 minutes of direct pressure to rt groin from Chip Xcel Energynderson RN.  DSG applied to site and post instructions given.  Area soft without bruising or hematoma.  Palpatable rt dp  Pt to SS..Marland Kitchen

## 2015-12-06 NOTE — Progress Notes (Signed)
Pt denies SOB and chest pain. Pt is under the care of Dr. Allyson SabalBerry, Cardiology. Pt denies having an echo and cardiac cath.

## 2015-12-06 NOTE — Discharge Instructions (Signed)
Angiogram, Care After °Refer to this sheet in the next few weeks. These instructions provide you with information about caring for yourself after your procedure. Your health care provider may also give you more specific instructions. Your treatment has been planned according to current medical practices, but problems sometimes occur. Call your health care provider if you have any problems or questions after your procedure. °WHAT TO EXPECT AFTER THE PROCEDURE °After your procedure, it is typical to have the following: °· Bruising at the catheter insertion site that usually fades within 1-2 weeks. °· Blood collecting in the tissue (hematoma) that may be painful to the touch. It should usually decrease in size and tenderness within 1-2 weeks. °HOME CARE INSTRUCTIONS °· Take medicines only as directed by your health care provider. °· You may shower 24-48 hours after the procedure or as directed by your health care provider. Remove the bandage (dressing) and gently wash the site with plain soap and water. Pat the area dry with a clean towel. Do not rub the site, because this may cause bleeding. °· Do not take baths, swim, or use a hot tub until your health care provider approves. °· Check your insertion site every day for redness, swelling, or drainage. °· Do not apply powder or lotion to the site. °· Do not lift over 10 lb (4.5 kg) for 5 days after your procedure or as directed by your health care provider. °· Ask your health care provider when it is okay to: °¨ Return to work or school. °¨ Resume usual physical activities or sports. °¨ Resume sexual activity. °· Do not drive home if you are discharged the same day as the procedure. Have someone else drive you. °· You may drive 24 hours after the procedure unless otherwise instructed by your health care provider. °· Do not operate machinery or power tools for 24 hours after the procedure or as directed by your health care provider. °· If your procedure was done as an  outpatient procedure, which means that you went home the same day as your procedure, a responsible adult should be with you for the first 24 hours after you arrive home. °· Keep all follow-up visits as directed by your health care provider. This is important. °SEEK MEDICAL CARE IF: °· You have a fever. °· You have chills. °· You have increased bleeding from the catheter insertion site. Hold pressure on the site and call 911. °SEEK IMMEDIATE MEDICAL CARE IF: °· You have unusual pain at the catheter insertion site. °· You have redness, warmth, or swelling at the catheter insertion site. °· You have drainage (other than a small amount of blood on the dressing) from the catheter insertion site. °· The catheter insertion site is bleeding, and the bleeding does not stop after 30 minutes of holding steady pressure on the site. °· The area near or just beyond the catheter insertion site becomes pale, cool, tingly, or numb. °  °This information is not intended to replace advice given to you by your health care provider. Make sure you discuss any questions you have with your health care provider. °  °Document Released: 02/21/2005 Document Revised: 08/26/2014 Document Reviewed: 01/06/2013 °Elsevier Interactive Patient Education ©2016 [employer] ° °

## 2015-12-07 ENCOUNTER — Encounter (HOSPITAL_COMMUNITY): Payer: Self-pay | Admitting: Surgery

## 2015-12-07 MED ORDER — DEXTROSE 5 % IV SOLN
1.5000 g | INTRAVENOUS | Status: AC
  Administered 2015-12-08: 1.5 g via INTRAVENOUS
  Filled 2015-12-07: qty 1.5

## 2015-12-07 MED ORDER — SODIUM CHLORIDE 0.9 % IV SOLN: INTRAVENOUS | Status: DC

## 2015-12-07 MED FILL — Morphine Sulfate IV Soln PF 2 MG/ML: INTRAVENOUS | Qty: 1 | Status: AC

## 2015-12-07 MED FILL — Morphine Sulfate IV Soln PF 4 MG/ML: INTRAVENOUS | Qty: 1 | Status: AC

## 2015-12-08 ENCOUNTER — Inpatient Hospital Stay (HOSPITAL_COMMUNITY): Payer: Medicare Other | Admitting: Critical Care Medicine

## 2015-12-08 ENCOUNTER — Inpatient Hospital Stay (HOSPITAL_COMMUNITY)
Admission: RE | Admit: 2015-12-08 | Discharge: 2015-12-12 | DRG: 253 | Disposition: A | Discharge status: Home / Self Care | Payer: Medicare Other | Source: Ambulatory Visit | Attending: Surgery | Admitting: Surgery

## 2015-12-08 ENCOUNTER — Encounter (HOSPITAL_COMMUNITY): Payer: Self-pay | Admitting: Critical Care Medicine

## 2015-12-08 ENCOUNTER — Encounter (HOSPITAL_COMMUNITY)
Admission: RE | Disposition: A | Discharge status: Home / Self Care | Payer: Self-pay | Source: Ambulatory Visit | Attending: Surgery

## 2015-12-08 DIAGNOSIS — G8929 Other chronic pain: Secondary | ICD-10-CM | POA: Diagnosis present

## 2015-12-08 DIAGNOSIS — M79675 Pain in left toe(s): Secondary | ICD-10-CM | POA: Diagnosis present

## 2015-12-08 DIAGNOSIS — I10 Essential (primary) hypertension: Secondary | ICD-10-CM | POA: Diagnosis not present

## 2015-12-08 DIAGNOSIS — Z9889 Other specified postprocedural states: Secondary | ICD-10-CM | POA: Diagnosis not present

## 2015-12-08 DIAGNOSIS — J449 Chronic obstructive pulmonary disease, unspecified: Secondary | ICD-10-CM | POA: Diagnosis not present

## 2015-12-08 DIAGNOSIS — Z7982 Long term (current) use of aspirin: Secondary | ICD-10-CM | POA: Diagnosis not present

## 2015-12-08 DIAGNOSIS — I739 Peripheral vascular disease, unspecified: Secondary | ICD-10-CM | POA: Diagnosis present

## 2015-12-08 DIAGNOSIS — M545 Low back pain: Secondary | ICD-10-CM | POA: Diagnosis not present

## 2015-12-08 DIAGNOSIS — I70245 Atherosclerosis of native arteries of left leg with ulceration of other part of foot: Secondary | ICD-10-CM | POA: Diagnosis not present

## 2015-12-08 DIAGNOSIS — L03116 Cellulitis of left lower limb: Secondary | ICD-10-CM | POA: Diagnosis not present

## 2015-12-08 DIAGNOSIS — F17210 Nicotine dependence, cigarettes, uncomplicated: Secondary | ICD-10-CM | POA: Diagnosis present

## 2015-12-08 HISTORY — PX: VEIN HARVEST: SHX6363

## 2015-12-08 HISTORY — PX: FEMORAL-TIBIAL BYPASS GRAFT: SHX938

## 2015-12-08 LAB — CBC
HCT: 34.5 % — ABNORMAL LOW (ref 39.0–52.0)
Hemoglobin: 11.3 g/dL — ABNORMAL LOW (ref 13.0–17.0)
MCH: 31.7 pg (ref 26.0–34.0)
MCHC: 32.8 g/dL (ref 30.0–36.0)
MCV: 96.9 fL (ref 78.0–100.0)
PLATELETS: 214 10*3/uL (ref 150–400)
RBC: 3.56 MIL/uL — AB (ref 4.22–5.81)
RDW: 13.9 % (ref 11.5–15.5)
WBC: 13.4 10*3/uL — ABNORMAL HIGH (ref 4.0–10.5)

## 2015-12-08 LAB — CREATININE, SERUM
CREATININE: 0.66 mg/dL (ref 0.61–1.24)
GFR calc Af Amer: >60 mL/min (ref >60)
GFR calc non Af Amer: >60 mL/min (ref >60)

## 2015-12-08 SURGERY — CREATION, BYPASS, ARTERIAL, FEMORAL TO TIBIAL, USING GRAFT
Anesthesia: General | Site: Leg Lower | Laterality: Left

## 2015-12-08 MED ORDER — ACETAMINOPHEN 325 MG PO TABS
325.0000 mg | ORAL_TABLET | ORAL | Status: DC | PRN
  Administered 2015-12-09: 650 mg via ORAL
  Filled 2015-12-08: qty 2

## 2015-12-08 MED ORDER — SODIUM CHLORIDE 0.9 % IV SOLN
INTRAVENOUS | Status: DC | PRN
  Administered 2015-12-08: 12:00:00

## 2015-12-08 MED ORDER — OXYCODONE HCL 5 MG PO TABS
15.0000 mg | ORAL_TABLET | ORAL | Status: DC | PRN
  Administered 2015-12-08 – 2015-12-10 (×9): 15 mg via ORAL
  Filled 2015-12-08 (×9): qty 3

## 2015-12-08 MED ORDER — LACTATED RINGERS IV SOLN
INTRAVENOUS | Status: DC | PRN
  Administered 2015-12-08 (×3): via INTRAVENOUS

## 2015-12-08 MED ORDER — SODIUM CHLORIDE 0.9 % IV SOLN: 500.0000 mL | Freq: Once | INTRAVENOUS | Status: DC | PRN

## 2015-12-08 MED ORDER — LABETALOL HCL 5 MG/ML IV SOLN: 10.0000 mg | INTRAVENOUS | Status: DC | PRN

## 2015-12-08 MED ORDER — FENTANYL CITRATE (PF) 250 MCG/5ML IJ SOLN
INTRAMUSCULAR | Status: AC
  Filled 2015-12-08: qty 5

## 2015-12-08 MED ORDER — POTASSIUM CHLORIDE CRYS ER 20 MEQ PO TBCR: 20.0000 meq | EXTENDED_RELEASE_TABLET | Freq: Every day | ORAL | Status: DC | PRN

## 2015-12-08 MED ORDER — CHLORHEXIDINE GLUCONATE CLOTH 2 % EX PADS: 6.0000 | MEDICATED_PAD | Freq: Once | CUTANEOUS | Status: DC

## 2015-12-08 MED ORDER — DOCUSATE SODIUM 100 MG PO CAPS
100.0000 mg | ORAL_CAPSULE | Freq: Every day | ORAL | Status: DC
  Administered 2015-12-09 – 2015-12-12 (×4): 100 mg via ORAL
  Filled 2015-12-08 (×4): qty 1

## 2015-12-08 MED ORDER — HYDROMORPHONE HCL 1 MG/ML IJ SOLN
INTRAMUSCULAR | Status: AC
  Filled 2015-12-08: qty 1

## 2015-12-08 MED ORDER — PROTAMINE SULFATE 10 MG/ML IV SOLN
INTRAVENOUS | Status: DC | PRN
  Administered 2015-12-08: 40 mg via INTRAVENOUS
  Administered 2015-12-08: 10 mg via INTRAVENOUS

## 2015-12-08 MED ORDER — ONDANSETRON HCL 4 MG/2ML IJ SOLN
INTRAMUSCULAR | Status: AC
  Filled 2015-12-08: qty 4

## 2015-12-08 MED ORDER — DEXTROSE 5 % IV SOLN
1.5000 g | Freq: Once | INTRAVENOUS | Status: AC
  Administered 2015-12-08: 1.5 g via INTRAVENOUS
  Filled 2015-12-08 (×2): qty 1.5

## 2015-12-08 MED ORDER — GLYCOPYRROLATE 0.2 MG/ML IJ SOLN
INTRAMUSCULAR | Status: AC
  Filled 2015-12-08: qty 2

## 2015-12-08 MED ORDER — PHENYLEPHRINE HCL 10 MG/ML IJ SOLN
INTRAMUSCULAR | Status: DC | PRN
Start: 2015-12-08 — End: 2015-12-08
  Administered 2015-12-08 (×3): 80 ug via INTRAVENOUS
  Administered 2015-12-08 (×2): 40 ug via INTRAVENOUS
  Administered 2015-12-08: 80 ug via INTRAVENOUS

## 2015-12-08 MED ORDER — ACETAMINOPHEN 160 MG/5ML PO SOLN: 325.0000 mg | ORAL | Status: DC | PRN

## 2015-12-08 MED ORDER — HEPARIN SODIUM (PORCINE) 1000 UNIT/ML IJ SOLN
INTRAMUSCULAR | Status: DC | PRN
  Administered 2015-12-08: 2000 [IU] via INTRAVENOUS
  Administered 2015-12-08: 5000 [IU] via INTRAVENOUS

## 2015-12-08 MED ORDER — VANCOMYCIN HCL IN DEXTROSE 750-5 MG/150ML-% IV SOLN
750.0000 mg | Freq: Two times a day (BID) | INTRAVENOUS | Status: DC
  Administered 2015-12-08 – 2015-12-12 (×7): 750 mg via INTRAVENOUS
  Filled 2015-12-08 (×11): qty 150

## 2015-12-08 MED ORDER — DEXTROSE 5 % IV SOLN
10.0000 mg | INTRAVENOUS | Status: DC | PRN
  Administered 2015-12-08: 25 ug/min via INTRAVENOUS

## 2015-12-08 MED ORDER — FENTANYL CITRATE (PF) 100 MCG/2ML IJ SOLN
INTRAMUSCULAR | Status: DC | PRN
  Administered 2015-12-08: 25 ug via INTRAVENOUS
  Administered 2015-12-08 (×3): 50 ug via INTRAVENOUS
  Administered 2015-12-08: 25 ug via INTRAVENOUS
  Administered 2015-12-08 (×2): 50 ug via INTRAVENOUS
  Administered 2015-12-08: 100 ug via INTRAVENOUS

## 2015-12-08 MED ORDER — HYDRALAZINE HCL 20 MG/ML IJ SOLN: 5.0000 mg | INTRAMUSCULAR | Status: DC | PRN

## 2015-12-08 MED ORDER — ONDANSETRON HCL 4 MG/2ML IJ SOLN: 4.0000 mg | Freq: Four times a day (QID) | INTRAMUSCULAR | Status: DC | PRN

## 2015-12-08 MED ORDER — 0.9 % SODIUM CHLORIDE (POUR BTL) OPTIME
TOPICAL | Status: DC | PRN
  Administered 2015-12-08: 2000 mL

## 2015-12-08 MED ORDER — HEMOSTATIC AGENTS (NO CHARGE) OPTIME
TOPICAL | Status: DC | PRN
  Administered 2015-12-08: 1 via TOPICAL

## 2015-12-08 MED ORDER — LIDOCAINE HCL (CARDIAC) 20 MG/ML IV SOLN
INTRAVENOUS | Status: DC | PRN
  Administered 2015-12-08: 40 mg via INTRAVENOUS

## 2015-12-08 MED ORDER — PROPOFOL 10 MG/ML IV BOLUS
INTRAVENOUS | Status: DC | PRN
  Administered 2015-12-08: 100 mg via INTRAVENOUS

## 2015-12-08 MED ORDER — GUAIFENESIN-DM 100-10 MG/5ML PO SYRP: 15.0000 mL | ORAL_SOLUTION | ORAL | Status: DC | PRN

## 2015-12-08 MED ORDER — ALUM & MAG HYDROXIDE-SIMETH 200-200-20 MG/5ML PO SUSP: 15.0000 mL | ORAL | Status: DC | PRN

## 2015-12-08 MED ORDER — ACETAMINOPHEN 650 MG RE SUPP: 325.0000 mg | RECTAL | Status: DC | PRN

## 2015-12-08 MED ORDER — MORPHINE SULFATE (PF) 2 MG/ML IV SOLN
2.0000 mg | INTRAVENOUS | Status: DC | PRN
  Administered 2015-12-08 – 2015-12-10 (×18): 2 mg via INTRAVENOUS
  Filled 2015-12-08 (×17): qty 1

## 2015-12-08 MED ORDER — DEXTROSE-NACL 5-0.9 % IV SOLN
INTRAVENOUS | Status: DC
  Administered 2015-12-08: 100 mL/h via INTRAVENOUS

## 2015-12-08 MED ORDER — PIPERACILLIN-TAZOBACTAM 3.375 G IVPB
3.3750 g | Freq: Three times a day (TID) | INTRAVENOUS | Status: DC
  Administered 2015-12-08 – 2015-12-11 (×8): 3.375 g via INTRAVENOUS
  Filled 2015-12-08 (×11): qty 50

## 2015-12-08 MED ORDER — ESMOLOL HCL 100 MG/10ML IV SOLN
INTRAVENOUS | Status: AC
  Filled 2015-12-08: qty 10

## 2015-12-08 MED ORDER — MUPIROCIN 2 % EX OINT
1.0000 "application " | TOPICAL_OINTMENT | Freq: Two times a day (BID) | CUTANEOUS | Status: DC
  Administered 2015-12-09 – 2015-12-12 (×8): 1 via NASAL
  Filled 2015-12-08 (×2): qty 22

## 2015-12-08 MED ORDER — PHENOL 1.4 % MT LIQD
1.0000 | OROMUCOSAL | Status: DC | PRN
Start: 2015-12-08 — End: 2015-12-12

## 2015-12-08 MED ORDER — ROCURONIUM BROMIDE 100 MG/10ML IV SOLN
INTRAVENOUS | Status: DC | PRN
  Administered 2015-12-08: 10 mg via INTRAVENOUS
  Administered 2015-12-08: 40 mg via INTRAVENOUS

## 2015-12-08 MED ORDER — OXYCODONE HCL 5 MG/5ML PO SOLN: 5.0000 mg | Freq: Once | ORAL | Status: DC | PRN

## 2015-12-08 MED ORDER — HYDROMORPHONE HCL 1 MG/ML IJ SOLN
0.2500 mg | INTRAMUSCULAR | Status: DC | PRN
  Administered 2015-12-08 (×4): 0.5 mg via INTRAVENOUS

## 2015-12-08 MED ORDER — OXYCODONE HCL 5 MG PO TABS: 5.0000 mg | ORAL_TABLET | Freq: Once | ORAL | Status: DC | PRN

## 2015-12-08 MED ORDER — MIDAZOLAM HCL 2 MG/2ML IJ SOLN
INTRAMUSCULAR | Status: AC
  Filled 2015-12-08: qty 2

## 2015-12-08 MED ORDER — MORPHINE SULFATE (PF) 2 MG/ML IV SOLN
INTRAVENOUS | Status: AC
  Filled 2015-12-08: qty 1

## 2015-12-08 MED ORDER — MAGNESIUM SULFATE 2 GM/50ML IV SOLN
2.0000 g | Freq: Every day | INTRAVENOUS | Status: DC | PRN
  Filled 2015-12-08: qty 50

## 2015-12-08 MED ORDER — ASPIRIN EC 81 MG PO TBEC
81.0000 mg | DELAYED_RELEASE_TABLET | Freq: Every day | ORAL | Status: DC
  Administered 2015-12-08 – 2015-12-12 (×4): 81 mg via ORAL
  Filled 2015-12-08 (×5): qty 1

## 2015-12-08 MED ORDER — GABAPENTIN 300 MG PO CAPS
300.0000 mg | ORAL_CAPSULE | Freq: Three times a day (TID) | ORAL | Status: DC
  Administered 2015-12-08 – 2015-12-12 (×11): 300 mg via ORAL
  Filled 2015-12-08 (×11): qty 1

## 2015-12-08 MED ORDER — NEOSTIGMINE METHYLSULFATE 10 MG/10ML IV SOLN
INTRAVENOUS | Status: AC
  Filled 2015-12-08: qty 1

## 2015-12-08 MED ORDER — ENOXAPARIN SODIUM 40 MG/0.4ML ~~LOC~~ SOLN
40.0000 mg | SUBCUTANEOUS | Status: DC
  Administered 2015-12-09 – 2015-12-12 (×4): 40 mg via SUBCUTANEOUS
  Filled 2015-12-08 (×4): qty 0.4

## 2015-12-08 MED ORDER — HYDROMORPHONE HCL 1 MG/ML IJ SOLN
INTRAMUSCULAR | Status: AC
  Administered 2015-12-08: 0.5 mg via INTRAVENOUS
  Filled 2015-12-08: qty 1

## 2015-12-08 MED ORDER — ROCURONIUM BROMIDE 50 MG/5ML IV SOLN
INTRAVENOUS | Status: AC
  Filled 2015-12-08: qty 1

## 2015-12-08 MED ORDER — MIDAZOLAM HCL 5 MG/5ML IJ SOLN
INTRAMUSCULAR | Status: DC | PRN
  Administered 2015-12-08: 2 mg via INTRAVENOUS

## 2015-12-08 MED ORDER — ONDANSETRON HCL 4 MG/2ML IJ SOLN
INTRAMUSCULAR | Status: DC | PRN
  Administered 2015-12-08: 4 mg via INTRAVENOUS

## 2015-12-08 MED ORDER — CHLORHEXIDINE GLUCONATE CLOTH 2 % EX PADS
6.0000 | MEDICATED_PAD | Freq: Every day | CUTANEOUS | Status: DC
  Administered 2015-12-09 – 2015-12-10 (×2): 6 via TOPICAL

## 2015-12-08 MED ORDER — LACTATED RINGERS IV SOLN
INTRAVENOUS | Status: DC
  Administered 2015-12-08: 09:00:00 via INTRAVENOUS

## 2015-12-08 MED ORDER — METOPROLOL TARTRATE 1 MG/ML IV SOLN
2.0000 mg | INTRAVENOUS | Status: DC | PRN
Start: 2015-12-08 — End: 2015-12-12

## 2015-12-08 MED ORDER — PANTOPRAZOLE SODIUM 40 MG PO TBEC
40.0000 mg | DELAYED_RELEASE_TABLET | Freq: Every day | ORAL | Status: DC
  Administered 2015-12-08 – 2015-12-12 (×5): 40 mg via ORAL
  Filled 2015-12-08 (×5): qty 1

## 2015-12-08 MED ORDER — ACETAMINOPHEN 325 MG PO TABS: 325.0000 mg | ORAL_TABLET | ORAL | Status: DC | PRN

## 2015-12-08 MED ORDER — GLYCOPYRROLATE 0.2 MG/ML IJ SOLN
INTRAMUSCULAR | Status: DC | PRN
  Administered 2015-12-08: 0.4 mg via INTRAVENOUS

## 2015-12-08 MED ORDER — NEOSTIGMINE METHYLSULFATE 10 MG/10ML IV SOLN
INTRAVENOUS | Status: DC | PRN
  Administered 2015-12-08: 3 mg via INTRAVENOUS

## 2015-12-08 MED ORDER — PHENYLEPHRINE 40 MCG/ML (10ML) SYRINGE FOR IV PUSH (FOR BLOOD PRESSURE SUPPORT)
PREFILLED_SYRINGE | INTRAVENOUS | Status: AC
  Filled 2015-12-08: qty 10

## 2015-12-08 SURGICAL SUPPLY — 61 items
BANDAGE ESMARK 6X9 LF (GAUZE/BANDAGES/DRESSINGS) ×1 IMPLANT
BNDG CMPR 9X6 STRL LF SNTH (GAUZE/BANDAGES/DRESSINGS) ×1
BNDG ESMARK 6X9 LF (GAUZE/BANDAGES/DRESSINGS) ×3
CANISTER SUCTION 2500CC (MISCELLANEOUS) ×3 IMPLANT
CANNULA VESSEL 3MM 2 BLNT TIP (CANNULA) ×3 IMPLANT
CLIP TI MEDIUM 24 (CLIP) ×3 IMPLANT
CLIP TI WIDE RED SMALL 24 (CLIP) ×6 IMPLANT
COVER SURGICAL LIGHT HANDLE (MISCELLANEOUS) ×3 IMPLANT
CUFF TOURNIQUET SINGLE 24IN (TOURNIQUET CUFF) ×3 IMPLANT
DRAIN CHANNEL 15F RND FF W/TCR (WOUND CARE) IMPLANT
DRAPE X-RAY CASS 24X20 (DRAPES) IMPLANT
ELECT REM PT RETURN 9FT ADLT (ELECTROSURGICAL) ×3
ELECTRODE REM PT RTRN 9FT ADLT (ELECTROSURGICAL) ×1 IMPLANT
EVACUATOR SILICONE 100CC (DRAIN) IMPLANT
GLOVE BIO SURGEON STRL SZ 6.5 (GLOVE) ×2 IMPLANT
GLOVE BIO SURGEON STRL SZ8 (GLOVE) ×3 IMPLANT
GLOVE BIO SURGEONS STRL SZ 6.5 (GLOVE) ×1
GLOVE BIOGEL PI IND STRL 6.5 (GLOVE) ×4 IMPLANT
GLOVE BIOGEL PI IND STRL 7.0 (GLOVE) ×1 IMPLANT
GLOVE BIOGEL PI IND STRL 7.5 (GLOVE) ×3 IMPLANT
GLOVE BIOGEL PI INDICATOR 6.5 (GLOVE) ×8
GLOVE BIOGEL PI INDICATOR 7.0 (GLOVE) ×2
GLOVE BIOGEL PI INDICATOR 7.5 (GLOVE) ×6
GLOVE ECLIPSE 6.0 STRL STRAW (GLOVE) ×3 IMPLANT
GLOVE ECLIPSE 6.5 STRL STRAW (GLOVE) ×9 IMPLANT
GLOVE ECLIPSE 7.0 STRL STRAW (GLOVE) ×6 IMPLANT
GLOVE SS BIOGEL STRL SZ 7 (GLOVE) ×1 IMPLANT
GLOVE SUPERSENSE BIOGEL SZ 7 (GLOVE) ×2
GLOVE SURG SS PI 7.5 STRL IVOR (GLOVE) ×3 IMPLANT
GOWN STRL REUS W/ TWL LRG LVL3 (GOWN DISPOSABLE) ×7 IMPLANT
GOWN STRL REUS W/ TWL XL LVL3 (GOWN DISPOSABLE) ×2 IMPLANT
GOWN STRL REUS W/TWL LRG LVL3 (GOWN DISPOSABLE) ×14
GOWN STRL REUS W/TWL XL LVL3 (GOWN DISPOSABLE) ×6
HEMOSTAT SNOW SURGICEL 2X4 (HEMOSTASIS) ×3 IMPLANT
KIT BASIN OR (CUSTOM PROCEDURE TRAY) ×3 IMPLANT
KIT ROOM TURNOVER OR (KITS) ×3 IMPLANT
LIQUID BAND (GAUZE/BANDAGES/DRESSINGS) ×12 IMPLANT
NS IRRIG 1000ML POUR BTL (IV SOLUTION) ×6 IMPLANT
PACK PERIPHERAL VASCULAR (CUSTOM PROCEDURE TRAY) ×3 IMPLANT
PAD ARMBOARD 7.5X6 YLW CONV (MISCELLANEOUS) ×6 IMPLANT
SET COLLECT BLD 21X3/4 12 (NEEDLE) IMPLANT
SPONGE LAP 18X18 X RAY DECT (DISPOSABLE) ×3 IMPLANT
STOPCOCK 4 WAY LG BORE MALE ST (IV SETS) IMPLANT
SUT ETHILON 3 0 PS 1 (SUTURE) IMPLANT
SUT PROLENE 5 0 C 1 24 (SUTURE) ×6 IMPLANT
SUT PROLENE 6 0 BV (SUTURE) ×6 IMPLANT
SUT PROLENE 7 0 BV 1 (SUTURE) ×3 IMPLANT
SUT SILK 2 0 SH (SUTURE) ×3 IMPLANT
SUT SILK 3 0 (SUTURE) ×4
SUT SILK 3-0 18XBRD TIE 12 (SUTURE) ×2 IMPLANT
SUT SILK 4 0 (SUTURE) ×3
SUT SILK 4-0 18XBRD TIE 12 (SUTURE) ×1 IMPLANT
SUT VIC AB 2-0 CT1 27 (SUTURE) ×6
SUT VIC AB 2-0 CT1 TAPERPNT 27 (SUTURE) ×2 IMPLANT
SUT VIC AB 3-0 SH 27 (SUTURE) ×18
SUT VIC AB 3-0 SH 27X BRD (SUTURE) ×6 IMPLANT
SUT VICRYL 4-0 PS2 18IN ABS (SUTURE) ×12 IMPLANT
TRAY FOLEY W/METER SILVER 16FR (SET/KITS/TRAYS/PACK) ×3 IMPLANT
TUBING EXTENTION W/L.L. (IV SETS) IMPLANT
UNDERPAD 30X30 INCONTINENT (UNDERPADS AND DIAPERS) ×3 IMPLANT
WATER STERILE IRR 1000ML POUR (IV SOLUTION) ×3 IMPLANT

## 2015-12-08 NOTE — Transfer of Care (Signed)
Immediate Anesthesia Transfer of Care Note  Patient: Christopher Pena  Procedure(s) Performed: Procedure(s):  LEFT FEMORAL-ANTERIOR TIBIAL ARTERY BYPASS GRAFT (Left) USING NON REVERSE  LEFT GREATER SAPHENOUS VEIN (Left)  Patient Location: PACU  Anesthesia Type:General  Level of Consciousness: awake, alert  and oriented  Airway & Oxygen Therapy: Patient Spontanous Breathing and Patient connected to nasal cannula oxygen  Post-op Assessment: Report given to RN, Post -op Vital signs reviewed and stable and Patient moving all extremities X 4  Post vital signs: Reviewed and stable  Last Vitals:  Filed Vitals:   12/08/15 0851 12/08/15 1106  BP: 121/74 101/71  Pulse: 89 101  Temp: 36.8 C 36.8 C  Resp: 18 16    Complications: No apparent anesthesia complications

## 2015-12-08 NOTE — Anesthesia Postprocedure Evaluation (Signed)
Anesthesia Post Note  Patient: Christopher Pena  Procedure(s) Performed: Procedure(s) (LRB):  LEFT FEMORAL-ANTERIOR TIBIAL ARTERY BYPASS GRAFT (Left) USING NON REVERSE  LEFT GREATER SAPHENOUS VEIN (Left)  Patient location during evaluation: PACU Anesthesia Type: General Level of consciousness: awake and alert and oriented Pain management: pain level controlled Vital Signs Assessment: post-procedure vital signs reviewed and stable Respiratory status: spontaneous breathing, nonlabored ventilation and respiratory function stable Cardiovascular status: blood pressure returned to baseline and stable Postop Assessment: no signs of nausea or vomiting Anesthetic complications: no    Last Vitals:  Filed Vitals:   12/08/15 1715 12/08/15 1730  BP: 94/63 94/69  Pulse: 75 83  Temp:    Resp: 15 11    Last Pain:  Filed Vitals:   12/08/15 1732  PainSc: 7                  Marelyn Rouser A.

## 2015-12-08 NOTE — Anesthesia Preprocedure Evaluation (Addendum)
Anesthesia Evaluation  Patient identified by MRN, date of birth, ID band Patient awake    Reviewed: Allergy & Precautions, NPO status , Patient's Chart, lab work & pertinent test results  History of Anesthesia Complications Negative for: history of anesthetic complications  Airway Mallampati: I  TM Distance: >3 FB Neck ROM: Full    Dental  (+) Upper Dentures, Lower Dentures   Pulmonary shortness of breath, COPD, Current Smoker,    breath sounds clear to auscultation       Cardiovascular hypertension, + Peripheral Vascular Disease   Rhythm:Regular     Neuro/Psych Anxiety  Neuromuscular disease    GI/Hepatic negative GI ROS, Neg liver ROS,   Endo/Other  negative endocrine ROS  Renal/GU negative Renal ROS     Musculoskeletal   Abdominal   Peds  Hematology negative hematology ROS (+)   Anesthesia Other Findings   Reproductive/Obstetrics                            Anesthesia Physical Anesthesia Plan  ASA: III  Anesthesia Plan: General   Post-op Pain Management:    Induction: Intravenous  Airway Management Planned: Oral ETT  Additional Equipment: Arterial line  Intra-op Plan:   Post-operative Plan: Extubation in OR  Informed Consent: I have reviewed the patients History and Physical, chart, labs and discussed the procedure including the risks, benefits and alternatives for the proposed anesthesia with the patient or authorized representative who has indicated his/her understanding and acceptance.   Dental advisory given  Plan Discussed with: Anesthesiologist and Surgeon  Anesthesia Plan Comments:        Anesthesia Quick Evaluation

## 2015-12-08 NOTE — H&P (View-Only) (Signed)
 [facility] [place]  Patient name: Christopher Pena MRN: [id number] DOB: 03/21/1946 Sex: male  REASON FOR VISIT: Left great toe wound and pain  HPI: Christopher Pena is a 69 y.o. male who presents for evaluation of left great toe ulcer and left foot pain. He is status post right femoral to below-knee popliteal bypass with ipsilateral non-reversed translocated vein by Dr. Nikky Duba on 11/12/2013. He previously underwent bilateral percutaneous revascularization by Dr. Berry.   He states that he had a blood blister on his left great toe approximately 4 years ago. He "opened up" the blister at home and since then has been having issues with it healing. He recently went to a podiatrist two weeks ago and did some toe nail trimming. He states that his left great toe has had dark darkening since then. He states that his left foot has been very painful and he is unable to sleep at night. He states that elevating his legs to help with the swelling in his foot that causes him great pain. Dangling the foot down helps with the pain.  The patient has claudication after ambulating 5200 feet on the right. He states that he is able to tolerate this. His greatest concern is his left foot. He continues to smoke.  He was previously on Plavix but ran out of refills 2 months ago. He was advised to see Dr. Berry, but the patient did not follow-up. He takes a daily aspirin, he continues to smoke.  He has a past medical history of chronic low back pain being followed at the pain clinic. He has hypertension not medically managed. He has a history of COPD.   Past Medical History  Diagnosis Date  . Chronic low back pain     "degenerative spine dx'd ~ 7 yr ago" (06/08/2013)  . COPD (chronic obstructive pulmonary disease) (HCC)   . Anxiety   . Claudication (HCC)   . Unexplained weight loss   . Tobacco abuse   . Hypertension     "moderately high; RX didn't help" (06/08/2013)  . PAD (peripheral  artery disease), PTA/stent IDEV & chocolate baloon 06/08/13 04/13/2013    Severely reduced ABI's of 0.3 bilaterally   . Non-healing wound of lower extremity 06/09/2013  . Tobacco use 06/09/2013  . Emphysema of lung (HCC)   . Shortness of breath     w/ exertion  . Neuromuscular disorder (HCC)     degen spine    Family History  Problem Relation Age of Onset  . Hyperlipidemia Mother   . Heart disease Mother   . Diabetes Mother   . Cancer Father     Liver    SOCIAL HISTORY: Social History  Substance Use Topics  . Smoking status: Light Tobacco Smoker -- 0.25 packs/day for 50 years    Types: Cigarettes    Start date: 07/03/1963  . Smokeless tobacco: Never Used     Comment: states "trying to quit"  . Alcohol Use: No     Comment: 06/08/2013 "quit 02/2007; drank my share before that though"    Allergies  Allergen Reactions  . Clindamycin/Lincomycin Diarrhea    Current Outpatient Prescriptions  Medication Sig Dispense Refill  . aspirin EC 81 MG tablet Take 81 mg by mouth daily.    . gabapentin (NEURONTIN) 300 MG capsule Take 1 capsule (300 mg total) by mouth 3 (three) times daily. 90 capsule 3  . oxyCODONE (ROXICODONE) 15 MG immediate release tablet Take 15 mg by mouth   every 4 (four) hours as needed for pain.    . clopidogrel (PLAVIX) 75 MG tablet take 1 tablet by mouth once daily (Patient not taking: Reported on 12/04/2015) 30 tablet 0  . levofloxacin (LEVAQUIN) 500 MG tablet Take 1 tablet (500 mg total) by mouth daily. 14 tablet 0   No current facility-administered medications for this visit.    REVIEW OF SYSTEMS:  [X] denotes positive finding, [ ] denotes negative finding Cardiac  Comments:  Chest pain or chest pressure:    Shortness of breath upon exertion:    Short of breath when lying flat:    Irregular heart rhythm:        Vascular    Pain in calf, thigh, or hip brought on by ambulation: x   Pain in feet at night that wakes you up from your sleep:  x   Blood clot  in your veins:    Leg swelling:         Pulmonary    Oxygen at home:    Productive cough:     Wheezing:         Neurologic    Sudden weakness in arms or legs:     Sudden numbness in arms or legs:     Sudden onset of difficulty speaking or slurred speech:    Temporary loss of vision in one eye:     Problems with dizziness:         Gastrointestinal    Blood in stool:     Vomited blood:         Genitourinary    Burning when urinating:     Blood in urine:        Psychiatric    Major depression:         Hematologic    Bleeding problems:    Problems with blood clotting too easily:        Skin    Rashes or ulcers:        Constitutional    Fever or chills:      PHYSICAL EXAM: Filed Vitals:   12/04/15 1206  BP: 101/66  Pulse: 108  Temp: 98.2 F (36.8 C)  TempSrc: Oral  Resp: 22  Height: 5' 8" (1.727 m)  Weight: 117 lb (53.071 kg)  SpO2: 96%    GENERAL: The patient is a thin male who appears older than stated age, in no acute distress. The vital signs are documented above. CARDIAC: There is a regular rate and rhythm. No carotid bruits VASCULAR: 2+ femoral pulses bilaterally. Nonpalpable popliteal pulses. Nonpalpable pedal pulses. PULMONARY: There is good air exchange bilaterally. Mild expiratory wheeze bilaterally. ABDOMEN: Soft and non-tender with normal pitched bowel sounds.  MUSCULOSKELETAL: Erythematous, swollen left great toe with ulceration and dry gangrenous changes at tip of toe. No drainage seen. NEUROLOGIC: No focal weakness or paresthesias are detected. SKIN: There are no ulcers or rashes noted. PSYCHIATRIC: The patient has a normal affect.  DATA:  ABIs 11/27/2015  Right: 0.69 with monophasic peroneal, posterior tibial and dorsalis pedis waveforms.  Left: 0.74 monophasic peroneal, posterior tibial and dorsalis pedis waveforms. Unable to obtain TBI due to ulcer. ABIs may be falsely elevated.  Lower extremity arterial duplex for 06/07/2016  Right  profunda to popliteal bypass is small and tortuous the mid to distal segment of the graft was unable to be visualized Known left SFA occlusion. Collateral flow seen from distal femoral artery.  AAA duplex for 06/07/2016  3.6 x 3.6 cm at mid   aorta 1.7 cm right common iliac artery 1.4 cm left common iliac artery  MEDICAL ISSUES:  Left great toe ulceration and left rest pain  The patient has previously undergone left popliteal stenting by Dr. Berry in 2015. He now presents with worsening left great toe ulcer and rest pain for the past 2 weeks. This appears infected. We'll prescribe Levaquin. He will need to undergo arteriogram to evaluate for revascularization options. This will be scheduled for 12/06/2015 with Dr. Jase Himmelberger. Tentatively, he'll be scheduled for left femoral to tibial bypass on 12/12/2015 with Dr. Linville Decarolis. He'll need to undergo cardiac clearance prior to this. His previous saphenous vein mapping from 2015 reveals an adequate left great saphenous vein conduit. He understands that he is at risk for limb loss. The patient ran out of his Plavix 2 months ago. He has not been back to see Dr. Berry for refill. He is on a daily aspirin.    Status post right femoral to below-knee popliteal bypass 11/12/2013 On duplex, this appeared very small and tortuous. Angiography in 2015 by Dr. Berry revealed 2 stenotic regions of his bypass. He is currently having claudication symptoms that are tolerable. Fortunately, he does not have any wounds.  AAA  His infrarenal AAA has been stable in size at 3.6 cm. There have been no changes since his last ultrasound in 2014 Dr. Hilty's office.   Kimberly Trinh, PA-C [facility] [place]   I agree with the above.  I have seen and evaluated the patient.  The patient has a history of a right femoral to below-knee popliteal artery bypass graft.  In 2015.  Despite a vein mapping that showed an excellent vein preoperatively, his vein became  sclerotic postoperatively, documented by angiography.  The patient has developed a wound on his left great toe with erythema and pain.  He has a history of stenting to his left popliteal artery.  This has occluded.  I am placing him on antibiotics for cellulitis.  He needs urgent revascularization of his left leg in order to salvage his foot.  I have scheduled him for angiography this Wednesday.  This will be a right femoral approach with diagnostic images of the left leg.  Next, he will be scheduled for a left femoral-tibial bypass graft on Tuesday, April 25.  He will remain off of his Plavix.  Wells Sheli Dorin    

## 2015-12-08 NOTE — Brief Op Note (Signed)
12/08/2015  9:44 PM  PATIENT:  Christopher Pena  70 y.o. male  PRE-OPERATIVE DIAGNOSIS:  Peripheral vascular disease with left great toe ulcer I70.245  POST-OPERATIVE DIAGNOSIS:  Peripheral vascular disease with left great toe ulcer I70.245  PROCEDURE:  Procedure(s):  LEFT FEMORAL-ANTERIOR TIBIAL ARTERY BYPASS GRAFT (Left) USING NON REVERSE  LEFT GREATER SAPHENOUS VEIN (Left)  SURGEON:  Surgeon(s) and Role:    * Nada LibmanVance W Brabham, MD - Primary    * Pryor OchoaJames D Lawson, MD - Assisting  PHYSICIAN ASSISTANT:   ASSISTANTS: JD Manuela SchwartzLawson, Samantha Rhyne, Maureen, Collins   ANESTHESIA:   general  EBL:  Total I/O In: -  Out: 100 [Urine:100]  BLOOD ADMINISTERED:none  DRAINS: none   LOCAL MEDICATIONS USED:  NONE  SPECIMEN:  No Specimen  DISPOSITION OF SPECIMEN:  N/A  COUNTS:  YES  TOURNIQUET:   Total Tourniquet Time Documented: Thigh (Left) - 25 minutes Total: Thigh (Left) - 25 minutes   DICTATION: .Reubin Milanragon Dictation  PLAN OF CARE: Admit to inpatient   PATIENT DISPOSITION:  PACU - hemodynamically stable.   Delay start of Pharmacological VTE agent (>24hrs) due to surgical blood loss or risk of bleeding: yes

## 2015-12-08 NOTE — Interval H&P Note (Signed)
History and Physical Interval Note:  12/08/2015 10:57 AM  Christopher Pena H Kishimoto  has presented today for surgery, with the diagnosis of Peripheral vascular disease with left great toe ulcer I70.245  The various methods of treatment have been discussed with the patient and family. After consideration of risks, benefits and other options for treatment, the patient has consented to  Procedure(s): BYPASS GRAFT FEMORAL-TIBIAL ARTERY (Left) as a surgical intervention .  The patient's history has been reviewed, patient examined, no change in status, stable for surgery.  I have reviewed the patient's chart and labs.  Questions were answered to the patient's satisfaction.     [name], Wells

## 2015-12-08 NOTE — Op Note (Signed)
Patient name: Christopher Pena MRN: [id number] DOB: 12/05/1945 Sex: male  12/08/2015 Pre-operative Diagnosis: Left Toe Ulcer Post-operative diagnosis:  Same Surgeon:  Durene Cal Assistants:  Betti Cruz, Willette Brace Procedure:   Left Femoral to Anterior tibial bypass with ipsilateral non-reversed greater saphenous vein Anesthesia:  General Blood Loss:  See anesthesia record Specimens:  none  Findings:  Excellent vein.  Anterior tibial artery was soft.  Indications:  The patient has a history of a right femoral to below-knee popliteal artery bypass graft which failed secondary to the vein conduit.  He has recently developed a wound on his left great toe.  He has a history of percutaneous stenting by Dr. Allyson Sabal which have been chronically occluded.  Recent angiogram revealed the dominant runoff to be the anterior tibial artery.  He comes in today for bypass.  Preoperative vein mapping showed excellent vein.  Procedure:  The patient was identified in the holding area and taken to [facility] OR ROOM 16  The patient was then placed supine on the table. general anesthesia was administered.  The patient was prepped and draped in the usual sterile fashion.  A time out was called and antibiotics were administered.  A longitudinal incision was made in the left groin.  Cautery was used to divide the subcutaneous tissue down to the femoral sheath which was opened sharply.  The common femoral artery was exposed from the inguinal ligament down to the bifurcation.  The superficial femoral and profunda femoral artery were individually isolated.  The common femoral artery had some posterior plaque but was otherwise soft.  Next, the saphenous vein was identified at the saphenofemoral junction and dissected free.  Side branches were ligated between silk ties.  I proceeded to harvest the left great saphenous vein down below the knee through skip incisions.  Branches were individually divided.  This was a  excellent caliber vein.  It was 4-5 millimeters above the knee and at least 3 mm below the knee.  Next, a lateral incision was made below the knee.  Through this incision and identified the anterior tibial artery and dissected it free.  This was a soft artery measuring approximately 3 mm.  Next, I created a superficial tunnel, on the lateral side of the leg.  Once the tunnel was created, the patient was fully heparinized.  The saphenous vein was ligated at the saphenofemoral junction in 2 layers with 5-0 Prolene.  It was ligated distally with a silk tie.  The vein was prepared on the back table.  It distended nicely to at least 4 mm throughout.  Next, the common femoral profunda femoral and superficial femoral artery were individually occluded.  A #11 blade was used to make an arteriotomy in the common femoral artery which was extended longitudinally down to the origin of the profunda.  The vein was placed in a non-reversed fashion.  The proximal vein was spatulated to fit the size of the arteriotomy a running anastomosis was created with 5-0 Prolene.  Once this was completed, the clamps were released.  Sims valvulotomes were used to lyse all of the valves.  There was excellent pulsatile flow through the vein graft.  This was marked for orientation and then brought through the previously created tunnel, making sure to avoid twisting or kinking.  Next, a tourniquet was placed on the upper thigh.  The leg was exsanguinated with an Esmarch.  The tourniquet was taken to 250 mm of pressure.  Next, the anterior tibial  artery was opened with a #11 blade and extended longitudinally.  I was able to pass a #3 Fogarty catheter all the way down across the ankle.  The vein was cut to the appropriate length and then spatulated to fit the size of the arteriotomy.  A running anastomosis was created with 6-0 Prolene.  Prior to completion, the tourniquet was let down and the appropriate flushing maneuvers were performed.  The  anastomosis was then completed.  At this point there was an excellent pulse within the graft and an excellent Doppler signal in the dorsalis pedis artery at the ankle which was graft dependent.  The patient's heparin was then reversed with 50 mg of protamine.  Once hemostasis was satisfactory the groin was closed by reapproximating the femoral sheath with 2-0 Vicryl followed by multiple layers of 20 and 3-0 Vicryl on the subcutaneous tissue and 4-0 Vicryl on the skin.  The vein harvest incisions were closed with 2 layers of 3-0 Vicryl.  The lateral below knee incision was closed by reapproximating the subcutaneous Tissue with 3-0 Vicryl.  I did not close the fascia.  The skin was closed running 4-0 Vicryl followed by Dermabond.  The patient tolerated the procedure well there were no immediate complications.   Disposition:  To PACU in stable condition.   [name] Brabham, M.D. [facility] [place] Office: [phone] Pager:  [phone]

## 2015-12-08 NOTE — Anesthesia Procedure Notes (Addendum)
Procedure Name: Intubation Date/Time: 12/08/2015 11:27 AM Performed by: Glo HerringLEE, Ellison Rieth B Pre-anesthesia Checklist: Patient identified, Emergency Drugs available, Suction available, Patient being monitored and Timeout performed Patient Re-evaluated:Patient Re-evaluated prior to inductionOxygen Delivery Method: Circle system utilized Preoxygenation: Pre-oxygenation with 100% oxygen Intubation Type: IV induction Ventilation: Mask ventilation without difficulty Laryngoscope Size: Mac and 4 Grade View: Grade I Tube type: Oral Tube size: 7.5 mm Number of attempts: 1 Airway Equipment and Method: Stylet Placement Confirmation: ETT inserted through vocal cords under direct vision,  positive ETCO2 and breath sounds checked- equal and bilateral Secured at: 22 cm Tube secured with: Tape Dental Injury: Teeth and Oropharynx as per pre-operative assessment

## 2015-12-08 NOTE — Progress Notes (Signed)
Pharmacy Antibiotic Note  Christopher Pena is a 70 y.o. male admitted on 12/08/2015 with infected toe.  Pharmacy has been consulted for Vancomycin dosing.  Also receiving Zosyn S/p right fem to anterior tibial bypass  Plan: Zosyn 3.375 grams iv Q 8 hours - 4 hr infusion Vancomycin 750 mg iv Q 12 hours   Height: 5\' 8"  (172.7 cm) Weight: 125 lb 3.5 oz (56.8 kg) IBW/kg (Calculated) : 68.4  Temp (24hrs), Avg:97.7 F (36.5 C), Min:97.3 F (36.3 C), Max:98.2 F (36.8 C)   Recent Labs Lab 12/06/15 0936 12/06/15 1406  WBC  --  7.8  CREATININE 0.80 0.82    Estimated Creatinine Clearance: 68.3 mL/min (by C-G formula based on Cr of 0.82).    Allergies  Allergen Reactions  . Clindamycin/Lincomycin Diarrhea    Antimicrobials this admission: Vancomycin 4/21>>  Zosyn 4/21 >>  Microbiology results: Cultures pending  Thank you for allowing pharmacy to be a part of this patient's care.  Elwin Sleightowell, Phinley Schall Kay 12/08/2015 7:04 PM

## 2015-12-08 NOTE — Progress Notes (Signed)
      Left by pass graft palpable Palpable left DP pulses Great toe wound no change from pre-op open to air   S/P right fem to anterior tibial by pass Waiting for bed on 3S Stable  COLLINS, EMMA MAUREEN PA-C

## 2015-12-09 LAB — CBC
HCT: 32.1 % — ABNORMAL LOW (ref 39.0–52.0)
Hemoglobin: 10.6 g/dL — ABNORMAL LOW (ref 13.0–17.0)
MCH: 31.8 pg (ref 26.0–34.0)
MCHC: 33 g/dL (ref 30.0–36.0)
MCV: 96.4 fL (ref 78.0–100.0)
PLATELETS: 210 10*3/uL (ref 150–400)
RBC: 3.33 MIL/uL — AB (ref 4.22–5.81)
RDW: 13.8 % (ref 11.5–15.5)
WBC: 10.5 10*3/uL (ref 4.0–10.5)

## 2015-12-09 LAB — BASIC METABOLIC PANEL
ANION GAP: 9 (ref 5–15)
BUN: 6 mg/dL (ref 6–20)
CALCIUM: 7.9 mg/dL — AB (ref 8.9–10.3)
CO2: 28 mmol/L (ref 22–32)
Chloride: 98 mmol/L — ABNORMAL LOW (ref 101–111)
Creatinine, Ser: 0.66 mg/dL (ref 0.61–1.24)
Glucose, Bld: 103 mg/dL — ABNORMAL HIGH (ref 65–99)
POTASSIUM: 3.8 mmol/L (ref 3.5–5.1)
SODIUM: 135 mmol/L (ref 135–145)

## 2015-12-09 MED ORDER — BOOST / RESOURCE BREEZE PO LIQD
1.0000 | Freq: Three times a day (TID) | ORAL | Status: DC
  Administered 2015-12-09: 1 via ORAL

## 2015-12-09 MED ORDER — ENSURE ENLIVE PO LIQD
237.0000 mL | Freq: Two times a day (BID) | ORAL | Status: DC
  Administered 2015-12-09 – 2015-12-11 (×4): 237 mL via ORAL

## 2015-12-09 MED ORDER — NICOTINE 21 MG/24HR TD PT24
21.0000 mg | MEDICATED_PATCH | Freq: Every day | TRANSDERMAL | Status: DC
  Administered 2015-12-09 – 2015-12-12 (×4): 21 mg via TRANSDERMAL
  Filled 2015-12-09 (×4): qty 1

## 2015-12-09 NOTE — Evaluation (Signed)
Physical Therapy Evaluation Patient Details Name: Christopher Pena MRN: [id number] DOB: September 02, 1945 Today's Date: 12/09/2015   History of Present Illness  Patient is a 70 y/o male admitted due to PVD now s/p L fem to ant tib BPG.  Clinical Impression  Patient presents with decreased independence with mobility due to deficits listed in PT problem list.  He will benefit from skilled PT in the acute setting to allow return home with some level of family support.  At this point may need to stay with daughter initially, but depending on progress may be able to go home with intermittent support and likely not need follow up PT at d/c.  Will continue to follow acutely.    Follow Up Recommendations No PT follow up;Supervision - Intermittent    Equipment Recommendations  Rolling walker with 5" wheels    Recommendations for Other Services       Precautions / Restrictions Precautions Precautions: Fall      Mobility  Bed Mobility Overal bed mobility: Needs Assistance Bed Mobility: Sit to Supine       Sit to supine: Supervision   General bed mobility comments: for safety, cues for positioning  Transfers Overall transfer level: Needs assistance Equipment used: Rolling walker (2 wheeled) Transfers: Sit to/from Stand Sit to Stand: Min assist         General transfer comment: initially posterior, but not much lifing help needed, cues for hand placement  Ambulation/Gait Ambulation/Gait assistance: Min guard Ambulation Distance (Feet): 200 Feet Assistive device: Rolling walker (2 wheeled) Gait Pattern/deviations: Antalgic;Decreased stride length;Step-to pattern     General Gait Details: stopped x 1 to stand and rest and c/o L shoulder soreness with weight bearing, but no prior history of pain   Stairs            Wheelchair Mobility    Modified Rankin (Stroke Patients Only)       Balance Overall balance assessment: Needs assistance   Sitting balance-Leahy Scale:  Good     Standing balance support: Single extremity supported Standing balance-Leahy Scale: Fair Standing balance comment: can stand with weight shifted right with little to no UE support in static standing                             Pertinent Vitals/Pain Pain Assessment: Faces Faces Pain Scale: Hurts even more Pain Location: L calf with ambulation and L shoulder when putting lot of weight on walker Pain Descriptors / Indicators: Tightness;Sore Pain Intervention(s): Limited activity within patient's tolerance;Repositioned;Monitored during session;RN gave pain meds during session    Home Living Family/patient expects to be discharged to:: Private residence Living Arrangements: Children;Alone (lives alone, but can stay with daughter if needed) Available Help at Discharge: Family;Available 24 hours/day Type of Home: Apartment Home Access: Level entry     Home Layout: One level Home Equipment: Walker - standard      Prior Function Level of Independence: Independent         Comments: would occasionally use scooter at store, but if short distance would walk     Hand Dominance        Extremity/Trunk Assessment   Upper Extremity Assessment: Overall WFL for tasks assessed           Lower Extremity Assessment: LLE deficits/detail   LLE Deficits / Details: ankle and lower leg edema limit DF at ankle, noted tenderness from incision limiting hip and knee flexion  Cervical / Trunk  Assessment: Kyphotic;Other exceptions  Communication   Communication: No difficulties  Cognition Arousal/Alertness: Awake/alert Behavior During Therapy: WFL for tasks assessed/performed Overall Cognitive Status: Within Functional Limits for tasks assessed                      General Comments      Exercises General Exercises - Lower Extremity Ankle Circles/Pumps: AROM;Both;10 reps;Supine      Assessment/Plan    PT Assessment Patient needs continued PT services   PT Diagnosis Difficulty walking;Acute pain   PT Problem List Decreased strength;Decreased activity tolerance;Decreased balance;Decreased mobility;Decreased range of motion;Decreased knowledge of use of DME;Decreased safety awareness;Decreased knowledge of precautions;Pain  PT Treatment Interventions DME instruction;Balance training;Gait training;Functional mobility training;Patient/family education;Therapeutic activities;Therapeutic exercise   PT Goals (Current goals can be found in the Care Plan section) Acute Rehab PT Goals Patient Stated Goal: to go home PT Goal Formulation: With patient Time For Goal Achievement: 12/16/15 Potential to Achieve Goals: Good    Frequency Min 3X/week   Barriers to discharge Decreased caregiver support      Co-evaluation               End of Session Equipment Utilized During Treatment: Gait belt Activity Tolerance: Patient tolerated treatment well Patient left: in bed;with call bell/phone within reach           Time: 1478-2956 PT Time Calculation (min) (ACUTE ONLY): 32 min   Charges:   PT Evaluation $PT Eval Moderate Complexity: 1 Procedure PT Treatments $Gait Training: 8-22 mins   PT G CodesElray Mcgregor 12/10/15, 6:28 PM  Sheran Lawless, PT [phone] 2015-12-10

## 2015-12-09 NOTE — Progress Notes (Addendum)
[facility] [place]  Subjective  - Doing well over all, he has had pain issues and is in pain management.  Seems controlled right now.   Objective 91/60 92 99.7 F (37.6 C) (Oral) 14 94%  Intake/Output Summary (Last 24 hours) at 12/09/15 0804 Last data filed at 12/09/15 0600  Gross per 24 hour  Intake 3608.33 ml  Output   1900 ml  Net 1708.33 ml    Left foot with moderate edema and ruber when down Palpable by pass graft, left foot is warm Heart RRR Lungs non labored breathing    Assessment/Planning: POD #1 left fem to anterior tibial by pass  Stable disposition will transfer to 2w Ordered nicotine patch 21 mg Elevate and alternate with dependent position for comfort  Thomasena EdisCOLLINS, EMMA [facility] 12/09/2015 8:04 AM -- Agree with above.  Easily palpable graft pulse.  Some soreness Transfer 2w Incisions look ok  Fabienne Brunsharles Fields, MD [facility] [place] Office: [phone] Pager: [phone]   Laboratory Lab Results:  Recent Labs  12/08/15 2212 12/09/15 0520  WBC 13.4* 10.5  HGB 11.3* 10.6*  HCT 34.5* 32.1*  PLT 214 210   BMET  Recent Labs  12/06/15 1406 12/08/15 2212 12/09/15 0520  NA 138  --  135  K 3.3*  --  3.8  CL 98*  --  98*  CO2 27  --  28  GLUCOSE 168*  --  103*  BUN 13  --  6  CREATININE 0.82 0.66 0.66  CALCIUM 8.5*  --  7.9*    COAG Lab Results  Component Value Date   INR 1.11 12/06/2015   INR 1.05 05/11/2014   INR 1.02 11/11/2013   No results found for: PTT

## 2015-12-09 NOTE — Progress Notes (Signed)
Initial Nutrition Assessment  DOCUMENTATION CODES:   Severe malnutrition in context of chronic illness  INTERVENTION:   Ensure Enlive po BID, each supplement provides 350 kcal and 20 grams of protein  NUTRITION DIAGNOSIS:   Increased nutrient needs related to  (post op healing) as evidenced by estimated needs  GOAL:   Patient will meet greater than or equal to 90% of their needs  MONITOR:   PO intake, Supplement acceptance, Labs, Weight trends, I & O's  REASON FOR ASSESSMENT:   Malnutrition Screening Tool  ASSESSMENT:   70 y.o. Male who presented for evaluation of left great toe ulcer and left foot pain. He is status post right femoral to below-knee popliteal bypass with ipsilateral non-reversed translocated vein by Dr. Myra GianottiBrabham on 11/12/2013. He previously underwent bilateral percutaneous revascularization by Dr. Allyson SabalBerry.   Patient s/p procedures 4/21: LEFT FEMORAL-ANTERIOR TIBIAL ARTERY BYPASS GRAFT  USING NON REVERSELEFT GREATER SAPHENOUS VEIN   Patient is waiting for his breakfast tray upon RD visit. Reports he hadn't been eating as well PTA. Reveals he's lost about 75 lbs in the last 2-3 years. Drinks Ensure oral nutrition supplements at home. Would benefit from supplements during his hospital stay >> RD to order.  Nutrition-Focused physical exam completed. Findings are severe fat depletion, severe muscle depletion, and no edema.   Diet Order:  Diet Heart Room service appropriate?: Yes; Fluid consistency:: Thin  Skin:  Stage I to coccyx, surgical incisions to L toe  Last BM:  4/19  Height:   Ht Readings from Last 1 Encounters:  12/08/15 5\' 8"  (1.727 m)    Weight:   Wt Readings from Last 1 Encounters:  12/08/15 125 lb 3.5 oz (56.8 kg)    Ideal Body Weight:  70 kg  BMI:  Body mass index is 19.04 kg/(m^2).  Estimated Nutritional Needs:   Kcal:  1700-1900  Protein:  85-95 gm  Fluid:  1.7-1.9 L  EDUCATION NEEDS:   No education needs identified  at this time  Maureen ChattersKatie Geroge Gilliam, RD, LDN Pager #: [phone] After-Hours Pager #: [phone]

## 2015-12-10 MED ORDER — OXYCODONE HCL 5 MG PO TABS
10.0000 mg | ORAL_TABLET | ORAL | Status: DC
  Administered 2015-12-10 – 2015-12-12 (×24): 10 mg via ORAL
  Filled 2015-12-10 (×24): qty 2

## 2015-12-10 NOTE — Progress Notes (Signed)
Occupational Therapy Evaluation Patient Details Name: Christopher Pena MRN: [id number] DOB: [birth year] Today's Date: 12/10/2015    History of Present Illness Patient is a 70 y/o male admitted due to PVD now s/p L fem to ant tib BPG.   Clinical Impression   Pt admitted with the above diagnoses and presents with below problem list. Pt will benefit from continued acute OT to address the below listed deficits and maximize independence with BADLs prior to d/c to venue below. PTA pt was independent with ADLs. Pt is currently min guard to min A. Pt plans to d/c home to family members home until ready to return home where he lives alone. Pt verbalized desire to have [facility] aide service to assist with his medical care at d/c.      Follow Up Recommendations  No OT follow up;Supervision - Intermittent    Equipment Recommendations  3 in 1 bedside comode    Recommendations for Other Services       Precautions / Restrictions Precautions Precautions: Fall Restrictions     Mobility Bed Mobility Overal bed mobility: Needs Assistance Bed Mobility: Supine to Sit     Supine to sit: Supervision;HOB elevated     General bed mobility comments: extra effort, used bed rails  Transfers Overall transfer level: Needs assistance Equipment used: Rolling walker (2 wheeled) Transfers: Sit to/from Stand Sit to Stand: Min assist         General transfer comment: min A during powerup and to control descent. from EOB and recliner. cues for technique.    Balance Overall balance assessment: Needs assistance Sitting-balance support: No upper extremity supported;Feet supported Sitting balance-Leahy Scale: Good Sitting balance - Comments: touched floor in sitting position to retrieve item with no LOB   Standing balance support: Bilateral upper extremity supported;During functional activity Standing balance-Leahy Scale: Fair Standing balance comment: rw utilized for balance                             ADL Overall ADL's : Needs assistance/impaired Eating/Feeding: Set up;Sitting   Grooming: Set up;Sitting   Upper Body Bathing: Set up;Sitting   Lower Body Bathing: Minimal assistance;Sit to/from stand   Upper Body Dressing : Set up;Sitting   Lower Body Dressing: Minimal assistance;Sit to/from stand   Toilet Transfer: Minimal assistance;Ambulation;RW   Toileting- Clothing Manipulation and Hygiene: Minimal assistance;Sit to/from stand   Tub/ Shower Transfer: Minimal assistance;Ambulation;Rolling walker;3 in 1   Functional mobility during ADLs: Min guard;Rolling walker;Minimal assistance General ADL Comments: Pt stood 2x from EOB. On first attempt pt sat suddenly due to incisional pain in LLE. Pt then ambulated around bed to recliner. Limited movements at ankle due to edema and tenderness. Discussed 3n1 for toilet transfer recommendation. Plans to stay with family  member at d/c until he feels ready to return home.      Vision     Perception     Praxis      Pertinent Vitals/Pain Pain Assessment: Faces Faces Pain Scale: Hurts even more Pain Location: LLE upon initial stand Pain Descriptors / Indicators: Grimacing Pain Intervention(s): Limited activity within patient's tolerance;Monitored during session;Repositioned     Hand Dominance     Extremity/Trunk Assessment Upper Extremity Assessment Upper Extremity Assessment: Overall WFL for tasks assessed   Lower Extremity Assessment Lower Extremity Assessment: Defer to PT evaluation   Cervical / Trunk Assessment Cervical / Trunk Assessment: Kyphotic;Other exceptions Cervical / Trunk Exceptions: forward head   Communication Communication  Communication: No difficulties   Cognition Arousal/Alertness: Awake/alert Behavior During Therapy: WFL for tasks assessed/performed Overall Cognitive Status: Within Functional Limits for tasks assessed                     General Comments       Exercises        Shoulder Instructions      Home Living Family/patient expects to be discharged to:: Private residence Living Arrangements: Children;Alone ((lives alone, but can stay with daughter if needed) Available Help at Discharge: Family;Available 24 hours/day Type of Home: Apartment Home Access: Level entry     Home Layout: One level         Bathroom Toilet: Standard     Home Equipment: Walker - standard          Prior Functioning/Environment Level of Independence: Independent        Comments: would occasionally use scooter at store, but if short distance would walk    OT Diagnosis: Acute pain   OT Problem List: Impaired balance (sitting and/or standing);Decreased knowledge of use of DME or AE;Decreased knowledge of precautions;Pain;Increased edema;Cardiopulmonary status limiting activity   OT Treatment/Interventions: Self-care/ADL training;Energy conservation;DME and/or AE instruction;Therapeutic activities;Patient/family education;Balance training    OT Goals(Current goals can be found in the care plan section) Acute Rehab OT Goals Patient Stated Goal: to go home OT Goal Formulation: With patient Time For Goal Achievement: 12/24/15 Potential to Achieve Goals: Good ADL Goals Pt Will Perform Lower Body Dressing: with modified independence;with adaptive equipment;sit to/from stand Pt Will Transfer to Toilet: with modified independence;bedside commode Pt Will Perform Toileting - Clothing Manipulation and hygiene: with modified independence;sitting/lateral leans;sit to/from stand  OT Frequency: Min 2X/week   Barriers to D/C:            Co-evaluation              End of Session Equipment Utilized During Treatment: Gait belt;Rolling walker  Activity Tolerance: Patient tolerated treatment well Patient left: in chair;with call bell/phone within reach;with chair alarm set   Time: [id number] OT Time Calculation (min): 32 min Charges:  OT General Charges $OT Visit: 1  Procedure OT Evaluation $OT Eval Low Complexity: 1 Procedure OT Treatments $Self Care/Home Management : 8-22 mins G-Codes:    Pilar GrammesMathews, Christopher Pena 12/10/2015, 11:06 AM

## 2015-12-10 NOTE — Progress Notes (Addendum)
[facility] [place]  Subjective  - Pain issues.  Requiring IV push every hour.   Objective 115/68 103 99.7 F (37.6 C) (Oral) 16 97%  Intake/Output Summary (Last 24 hours) at 12/10/15 0757 Last data filed at 12/10/15 [id number]  Gross per 24 hour  Intake    870 ml  Output   3025 ml  Net  -2155 ml    Doppler biphasic DP left, PT monophasic right Left leg incisions clean and dry Left groin soft without hematoma Heart RRR Lungs non labored breathing  Assessment/Planning: POD #2 left fem to anterior tibial by pass  Will change PO medication to q 2 10 mg from 15 mg q 3 hour and try to wean off IV pain medication By pass is patent Ambulate    Thomasena EdisCOLLINS, EMMA [facility] 12/10/2015 7:57 AM -- Easily palpable graft pulse Incisions continue to heal Some pain management issues Home next 1-2 days  Fabienne Brunsharles Arlita Buffkin, MD [facility] [place] Office: [phone] Pager: [phone]  Laboratory Lab Results:  Recent Labs  12/08/15 2212 12/09/15 0520  WBC 13.4* 10.5  HGB 11.3* 10.6*  HCT 34.5* 32.1*  PLT 214 210   BMET  Recent Labs  12/08/15 2212 12/09/15 0520  NA  --  135  K  --  3.8  CL  --  98*  CO2  --  28  GLUCOSE  --  103*  BUN  --  6  CREATININE 0.66 0.66  CALCIUM  --  7.9*    COAG Lab Results  Component Value Date   INR 1.11 12/06/2015   INR 1.05 05/11/2014   INR 1.02 11/11/2013   No results found for: PTT

## 2015-12-11 ENCOUNTER — Encounter (HOSPITAL_COMMUNITY): Payer: Self-pay | Admitting: Surgery

## 2015-12-11 ENCOUNTER — Ambulatory Visit (HOSPITAL_COMMUNITY): Payer: Medicare Other

## 2015-12-11 DIAGNOSIS — Z9889 Other specified postprocedural states: Secondary | ICD-10-CM

## 2015-12-11 MED ORDER — PIPERACILLIN-TAZOBACTAM 3.375 G IVPB
3.3750 g | Freq: Three times a day (TID) | INTRAVENOUS | Status: DC
  Administered 2015-12-11 – 2015-12-12 (×3): 3.375 g via INTRAVENOUS
  Filled 2015-12-11 (×6): qty 50

## 2015-12-11 NOTE — Progress Notes (Addendum)
Occupational Therapy Treatment Patient Details Name: Christopher GageDavid H Pena MRN: [id number] DOB: 09-08-1945 Today's Date: 12/11/2015    History of present illness Patient is a 70 y.o. male admitted due to PVD now s/p LEFT FEMORAL-ANTERIOR TIBIAL ARTERY BYPASS GRAFT. pt with hx of COPD, Anxiety, Emphysema, HTN, Chronic Back pain, PAD, and Non-healing wound on L LE.     OT comments  Pt progressing. Will continue to follow pt to help increase independence and reinforce energy conservation techniques.    Follow Up Recommendations  No OT follow up;Supervision - Intermittent    Equipment Recommendations  3 in 1 bedside comode    Recommendations for Other Services      Precautions / Restrictions Precautions Precautions: Fall Restrictions Weight Bearing Restrictions: No       Mobility Bed Mobility General bed mobility comments: not assessed  Transfers Overall transfer level: Needs assistance Transfers: Sit to/from Stand Sit to Stand: Supervision         General transfer comment: also set up for RW prior to initial stand from chair    Balance  Ambulated with RW with supervision (and set up for RW prior to initial stand from bed). Stood at sink and performed functional tasks with supervision.               ADL Overall ADL's : Needs assistance/impaired     Grooming: Oral care;Wash/dry hands;Supervision/safety;Standing Grooming Details (indicate cue type and reason): Pt retrieved toothbrush and OT retrieved toothpaste for him.             Lower Body Dressing: Supervision/safety;Sitting/lateral leans Lower Body Dressing Details (indicate cue type and reason): donned/doffed left sock Toilet Transfer: Supervision/safety;Ambulation;RW;Regular Toilet (stood and urinated at toilet; set up for RW)   Toileting- ArchitectClothing Manipulation and Hygiene: Supervision/safety (standing)       Functional mobility during ADLs: Supervision/safety;Rolling walker (and set up for RW prior to  initial stand from chair) General ADL Comments: Educated on safety such as sitting for LB dressing, safe footwear, use of bag on walker, and recommended someone be with him for shower transfer and bathing. Educated on energy conservation as pt's HR went up to 127.      Vision                     Perception     Praxis      Cognition  Awake/Alert Behavior During Therapy: WFL for tasks assessed/performed Overall Cognitive Status: No family/caregiver present to determine baseline cognitive functioning (decreased safety awareness)                       Extremity/Trunk Assessment               Exercises     Shoulder Instructions       General Comments      Pertinent Vitals/ Pain       Pain Assessment: 0-10 Pain Score: 5  Pain Location: left lower extremity  Pain Descriptors / Indicators: Sore;Tightness;Tender;Other (Comment) (stiff) Pain Intervention(s): Monitored during session;Repositioned  HR up to 127 in session. Cues for deep breathing technique and pt took a break. HR trended down.  Home Living                                          Prior Functioning/Environment  Frequency Min 2X/week     Progress Toward Goals  OT Goals(current goals can now be found in the care plan section)  Progress towards OT goals: Progressing toward goals-added a goal  Acute Rehab OT Goals Patient Stated Goal: be able to walk and get around OT Goal Formulation: With patient Time For Goal Achievement: 12/24/15 Potential to Achieve Goals: Good ADL Goals Pt Will Perform Lower Body Dressing: with modified independence;with adaptive equipment;sit to/from stand Pt Will Transfer to Toilet: with modified independence;bedside commode Pt Will Perform Toileting - Clothing Manipulation and hygiene: with modified independence;sitting/lateral leans;sit to/from stand Additional ADL Goal #1: Pt will independently verbalize 3 energy  conservation techniques and utilize in session as needed.  Plan Discharge plan remains appropriate    Co-evaluation                 End of Session Equipment Utilized During Treatment: Rolling walker   Activity Tolerance Patient tolerated treatment well   Patient Left in chair;with call bell/phone within reach;with chair alarm set   Nurse Communication          Time: [phone] OT Time Calculation (min): 17 min  Charges: OT General Charges $OT Visit: 1 Procedure OT Treatments $Self Care/Home Management : 8-22 mins   Earlie Raveling OTR/L [phone] 12/11/2015, 9:44 AM

## 2015-12-11 NOTE — Progress Notes (Signed)
@   0800am   IV site leaking, discontinued at this time.  IV team notified for restart  Governor SpeckingKathryn Ociel Retherford, RN

## 2015-12-11 NOTE — Progress Notes (Signed)
Physical Therapy Treatment Patient Details Name: Christopher GageDavid H Zavalza MRN: [id number] DOB: 22-Nov-1945 Today's Date: 12/11/2015    History of Present Illness Patient is a 70 y/o male admitted due to PVD now s/p L fem to ant tib BPG.  pt with hx of COPD, Anxiety, Emphysema, HTN, Chronic Back pain, PAD, and Non-healing wound on L LE.      PT Comments    Pt very eager for ambulation, but often gets distracted during conversation and needs cues to re-orient to task.  Pt's balance affected by him getting distracted with conversation and had 3 LOB requiring MinA to regain balance.  Feel pt would benefit from staying with his daughter initially until he is independent enough to return to home.    Follow Up Recommendations  No PT follow up;Supervision - Intermittent     Equipment Recommendations  Rolling walker with 5" wheels    Recommendations for Other Services       Precautions / Restrictions Precautions Precautions: Fall Restrictions Weight Bearing Restrictions: No LLE Weight Bearing: Touchdown weight bearing    Mobility  Bed Mobility Overal bed mobility: Modified Independent Bed Mobility: Supine to Sit     Supine to sit: Modified independent (Device/Increase time)     General bed mobility comments: Increased effort to complete, but no A or cueing needed.    Transfers Overall transfer level: Needs assistance Equipment used: Rolling walker (2 wheeled) Transfers: Sit to/from Stand Sit to Stand: Min guard         General transfer comment: cues for UE use and and attending to task.  pt verbose and needs re-directing at times.    Ambulation/Gait Ambulation/Gait assistance: Min assist Ambulation Distance (Feet): 300 Feet Assistive device: Rolling walker (2 wheeled) Gait Pattern/deviations: Step-through pattern;Decreased stride length;Trunk flexed     General Gait Details: cues for mroe upright posture and positioning within RW.  pt tends to talk and gets distracted causing  LOB.  pt had LOB x3 requiring MinA to regain balance otherwise is MinG.     Stairs            Wheelchair Mobility    Modified Rankin (Stroke Patients Only)       Balance Overall balance assessment: Needs assistance Sitting-balance support: No upper extremity supported;Feet supported Sitting balance-Leahy Scale: Good   Postural control: Posterior lean Standing balance support: Bilateral upper extremity supported;During functional activity Standing balance-Leahy Scale: Fair                      Cognition Arousal/Alertness: Awake/alert Behavior During Therapy: WFL for tasks assessed/performed Overall Cognitive Status: Within Functional Limits for tasks assessed                      Exercises      General Comments        Pertinent Vitals/Pain Pain Assessment: Faces Faces Pain Scale: Hurts a little bit Pain Location: "It's not pain, it's uncomfortable." Pain Descriptors / Indicators: Discomfort;Grimacing Pain Intervention(s): Monitored during session;Premedicated before session;Repositioned    Home Living                      Prior Function            PT Goals (current goals can now be found in the care plan section) Acute Rehab PT Goals Patient Stated Goal: to go home PT Goal Formulation: With patient Time For Goal Achievement: 12/16/15 Potential to Achieve Goals: Good Progress towards  PT goals: Progressing toward goals    Frequency  Min 3X/week    PT Plan Current plan remains appropriate    Co-evaluation             End of Session Equipment Utilized During Treatment: Gait belt Activity Tolerance: Patient tolerated treatment well Patient left: in chair;with call bell/phone within reach;with chair alarm set     Time: 6213-0865 PT Time Calculation (min) (ACUTE ONLY): 17 min  Charges:  $Gait Training: 8-22 mins                    G CodesSunny Schlein, Bethany [phone] 12/11/2015, 9:32 AM

## 2015-12-11 NOTE — Progress Notes (Signed)
Pharmacy Antibiotic Note  Christopher GageDavid H Pena is a 70 y.o. male admitted on 12/08/2015 with infected toe.  Pharmacy has been consulted for Vancomycin dosing. Spoke with Dr. Myra GianottiBrabham wants to continue vanc/zosyn through tomorrow and d/c home on oral antibiotics.  Plan: Zosyn 3.375 grams IV q8h Vancomycin 750 mg IV q12h F/u narrowing antibiotics tomorrow   Height: 5\' 8"  (172.7 cm) Weight: 125 lb 3.5 oz (56.8 kg) IBW/kg (Calculated) : 68.4  Temp (24hrs), Avg:98.1 F (36.7 C), Min:97.8 F (36.6 C), Max:98.5 F (36.9 C)   Recent Labs Lab 12/06/15 0936 12/06/15 1406 12/08/15 2212 12/09/15 0520  WBC  --  7.8 13.4* 10.5  CREATININE 0.80 0.82 0.66 0.66    Estimated Creatinine Clearance: 70 mL/min (by C-G formula based on Cr of 0.66).    Allergies  Allergen Reactions  . Clindamycin/Lincomycin Diarrhea    Antimicrobials this admission: Vancomycin 4/21>>  Zosyn 4/21 >>  Arlean HoppingCorey M. Newman PiesBall, PharmD, BCPS Clinical Pharmacist Pager [phone]  12/11/2015 10:47 AM

## 2015-12-11 NOTE — Progress Notes (Addendum)
[facility] [place]  Subjective  - Doing better.  Pain well controlled after adjustment of medications yesterday.   Objective 109/81 89 98.5 F (36.9 C) (Oral) 18 92%  Intake/Output Summary (Last 24 hours) at 12/11/15 0717 Last data filed at 12/11/15 0600  Gross per 24 hour  Intake    410 ml  Output   1225 ml  Net   -815 ml    Left foot warm and well perfused, decreased erythema and edema Left groin soft without hematoma Left leg incisions clean and dry Left great toe no change dry wound Lungs non labored breathing Heart RRR  Assessment/Planning: POD #3 left fem to anterior tibial by pass Will order IS for lungs, he has a nicotine patch Pending PT recommendations for discharge planning He is on pain management    Clinton GallantCOLLINS, EMMA [facility] 12/11/2015 7:17 AM --  Laboratory Lab Results:  Recent Labs  12/08/15 2212 12/09/15 0520  WBC 13.4* 10.5  HGB 11.3* 10.6*  HCT 34.5* 32.1*  PLT 214 210   BMET  Recent Labs  12/08/15 2212 12/09/15 0520  NA  --  135  K  --  3.8  CL  --  98*  CO2  --  28  GLUCOSE  --  103*  BUN  --  6  CREATININE 0.66 0.66  CALCIUM  --  7.9*    COAG Lab Results  Component Value Date   INR 1.11 12/06/2015   INR 1.05 05/11/2014   INR 1.02 11/11/2013   No results found for: PTT    I agree with the above.  I have seen and evaluated the patient.  His incisions are all healing nicely.  He has a palpable graft pulse and a palpable dorsalis pedis pulse on the left.  His pain is better controlled.  He will ambulate with PT today with plans for possible discharge tomorrow  Durene CalWells Jarryn Altland

## 2015-12-11 NOTE — Care Management Important Message (Signed)
Important Message  Patient Details  Name: Christopher Pena MRN: [id number] Date of Birth: [birth year]   Medicare Important Message Given:  Yes    Christopher Pena, [name] [name] 12/11/2015, 12:20 PM

## 2015-12-11 NOTE — Clinical Documentation Improvement (Signed)
Vascular Surgery  Can the diagnosis for the BMI of 19.04 be further provided if appropriate for admission? Thank you   Document Severity - Severe(third degree), Moderate (second degree), Mild (first degree)  Form - Kw[place]or (rarely seen in the [place] Marasmus, Other Condition, Unable to Determine  Other condition  Unable to clinically determine     Supporting Information: s/p Bypass femoral to popliteal artery w greater saphenous vein, pressure ulcer to rt foot   BMI: Body mass index is 19.04 kg/(m^2).  Please exercise your independent, professional judgment when responding. A specific answer is not anticipated or expected.   Thank You, Lavonda JumboLawanda J Masoud Nyce [facility] Information Management [place] [phone]

## 2015-12-11 NOTE — Progress Notes (Addendum)
VASCULAR LAB PRELIMINARY  ARTERIAL  ABI completed:    RIGHT    LEFT    PRESSURE WAVEFORM  PRESSURE WAVEFORM  BRACHIAL 112 Triphasic BRACHIAL 110 Triphasic  AT 63 Monophasic AT 115 Biphasic  PT 67 Monophasic PT 95 Monophasic    RIGHT LEFT  ABI 0.60 0.86   ABIs on the right indicate a moderate reduction in arterial flow at rest. Left ABI indicates a mild reduction in arterial flow at rest.  Helix Lafontaine, RVS 12/11/2015, 10:43 AM

## 2015-12-12 MED ORDER — CEPHALEXIN 500 MG PO CAPS: 500.0000 mg | ORAL_CAPSULE | Freq: Three times a day (TID) | ORAL | Status: DC

## 2015-12-12 NOTE — Progress Notes (Signed)
  Progress Note    12/12/2015 7:41 AM 4 Days Post-Op  Subjective:  Really no complaints  Afebrile HR 80's-90's NSR 100's systolic 99% RA  Filed Vitals:   12/11/15 1935 12/12/15 0651  BP: 103/67 108/69  Pulse: 90 84  Temp: 98.8 F (37.1 C) 97.7 F (36.5 C)  Resp: 18 18    Physical Exam: Cardiac:  regular Lungs:  Non labored Incisions:  All are clean and dry  Extremities:  +swelling LLE with palpable left DP and graft pulse   CBC    Component Value Date/Time   WBC 10.5 12/09/2015 0520   RBC 3.33* 12/09/2015 0520   HGB 10.6* 12/09/2015 0520   HCT 32.1* 12/09/2015 0520   PLT 210 12/09/2015 0520   MCV 96.4 12/09/2015 0520   MCH 31.8 12/09/2015 0520   MCHC 33.0 12/09/2015 0520   RDW 13.8 12/09/2015 0520    BMET    Component Value Date/Time   NA 135 12/09/2015 0520   K 3.8 12/09/2015 0520   CL 98* 12/09/2015 0520   CO2 28 12/09/2015 0520   GLUCOSE 103* 12/09/2015 0520   BUN 6 12/09/2015 0520   CREATININE 0.66 12/09/2015 0520   CREATININE 0.69 05/11/2014 1533   CALCIUM 7.9* 12/09/2015 0520   GFRNONAA >60 12/09/2015 0520   GFRNONAA >89 04/19/2014 1418   GFRAA >60 12/09/2015 0520   GFRAA >89 04/19/2014 1418    INR    Component Value Date/Time   INR 1.11 12/06/2015 1406     Intake/Output Summary (Last 24 hours) at 12/12/15 0741 Last data filed at 12/12/15 [id number]  Gross per 24 hour  Intake    720 ml  Output   1725 ml  Net  -1005 ml     Assessment:  70 y.o. male is s/p:  left fem to anterior tibial by pass  4 Days Post-Op  Plan: -pt with palpable left DP and palpable graft pulse -pt with more swelling in LLE today.  Denies pain and is not painful to palpation.  He did ambulate 3x yesterday and with vein harvest, swelling is not unexpected.  Continue leg elevation. -erythema LLE-pt on Vanc/Zosyn.  Will d/w Dr. Myra GianottiBrabham what Abx to send pt home on. -DVT prophylaxis:  Pt on Lovenox daily -pt is in the pain management clinic.  We will not prescribe  any pain medication and let his pain management MD manage.  He does have an appt there on Thursday.     Doreatha MassedSamantha Rhyne, PA-C [facility] [phone] 12/12/2015 7:41 AM

## 2015-12-12 NOTE — Care Management Note (Signed)
Case Management Note Donn PieriniKristi Nickson Middlesworth RN, BSN Unit 2W-Case Manager [phone]  Patient Details  Name: Christopher GageDavid H Pena MRN: [id number] Date of Birth: [birth year]  Subjective/Objective:     Pt admitted s/p  left fem to anterior tibial by pass              Action/Plan: PTA pt lived at home- no f/u recommendations made by PT- plan to return home- order placed for RW - [facility] to be delivered to room prior to discharge.  Expected Discharge Date:     12/12/15             Expected Discharge Plan:  Home/Self Care  In-House Referral:     Discharge planning Services  CM Consult  Post Acute Care Choice:  Durable Medical Equipment Choice offered to:  Patient  DME Arranged:  Dan HumphreysWalker rolling DME Agency:  [facility]  [facility]:     Status of Service:  Completed, signed off  Medicare Important Message Given:  Yes Date Medicare IM Given:    Medicare IM give by:    Date Additional Medicare IM Given:    Additional Medicare Important Message give by:     If discussed at Long Length of Stay Meetings, dates discussed:    Additional Comments:  Darrold SpanWebster, Avin Gibbons Hall, RN 12/12/2015, 10:34 AM

## 2015-12-13 ENCOUNTER — Telehealth: Payer: Self-pay | Admitting: Surgery

## 2015-12-13 NOTE — Telephone Encounter (Signed)
-----   Message from Sharee PimpleMarilyn K McChesney, RN sent at 12/12/2015 10:17 AM EDT ----- Regarding: FW: schedule   ----- Message -----    From: Sharee PimpleMarilyn K McChesney, RN    Sent: 12/12/2015   9:50 AM      To: [facility] Subject: schedule                                         ----- Message -----    From: Raymond GurneyKimberly A Trinh, PA-C    Sent: 12/12/2015   9:33 AM      To: [facility] Charge Pool  left fem to anterior tibial bypass 12/08/15  F/u with Dr. Myra GianottiBrabham in 2 weeks  Thanks Selena BattenKim

## 2015-12-13 NOTE — Telephone Encounter (Signed)
sched appt 5/8 at 8:45. Spoke to pt to inform them of appt.

## 2015-12-14 DIAGNOSIS — M5416 Radiculopathy, lumbar region: Secondary | ICD-10-CM | POA: Diagnosis not present

## 2015-12-14 DIAGNOSIS — G8929 Other chronic pain: Secondary | ICD-10-CM | POA: Diagnosis not present

## 2015-12-14 DIAGNOSIS — M545 Low back pain: Secondary | ICD-10-CM | POA: Diagnosis not present

## 2015-12-14 DIAGNOSIS — Z79891 Long term (current) use of opiate analgesic: Secondary | ICD-10-CM | POA: Diagnosis not present

## 2015-12-14 DIAGNOSIS — M5418 Radiculopathy, sacral and sacrococcygeal region: Secondary | ICD-10-CM | POA: Diagnosis not present

## 2015-12-14 DIAGNOSIS — M5431 Sciatica, right side: Secondary | ICD-10-CM | POA: Diagnosis not present

## 2015-12-15 ENCOUNTER — Encounter: Payer: Self-pay | Admitting: Surgery

## 2015-12-18 ENCOUNTER — Encounter: Payer: Self-pay | Admitting: Surgery

## 2015-12-18 ENCOUNTER — Ambulatory Visit (INDEPENDENT_AMBULATORY_CARE_PROVIDER_SITE_OTHER): Payer: Medicare Other | Admitting: Surgery

## 2015-12-18 VITALS — BP 115/73 | HR 90 | Temp 97.3°F | Resp 18 | Ht 68.0 in | Wt 116.0 lb

## 2015-12-18 DIAGNOSIS — I70245 Atherosclerosis of native arteries of left leg with ulceration of other part of foot: Secondary | ICD-10-CM

## 2015-12-18 NOTE — Discharge Summary (Signed)
[facility] Discharge Summary  ALIAS VILLAGRAN 22-Jun-1946 70 y.o. male  [id number]  Admission Date: 12/08/2015  Discharge Date: 12/12/2015  Physician: Admission Diagnosis: Peripheral vascular disease with left great toe ulcer I70.245  HPI:   This is a 70 y.o. male who presents for evaluation of left great toe ulcer and left foot pain. He is status post right femoral to below-knee popliteal bypass with ipsilateral non-reversed translocated vein by Dr. Myra Gianotti on 11/12/2013. He previously underwent bilateral percutaneous revascularization by Dr. Allyson Sabal.   He states that he had a blood blister on his left great toe approximately 4 years ago. He "opened up" the blister at home and since then has been having issues with it healing. He recently went to a podiatrist two weeks ago and did some toe nail trimming. He states that his left great toe has had dark darkening since then. He states that his left foot has been very painful and he is unable to sleep at night. He states that elevating his legs to help with the swelling in his foot that causes him great pain. Dangling the foot down helps with the pain.  The patient has claudication after ambulating 5200 feet on the right. He states that he is able to tolerate this. His greatest concern is his left foot. He continues to smoke.  He was previously on Plavix but ran out of refills 2 months ago. He was advised to see Dr. Allyson Sabal, but the patient did not follow-up. He takes a daily aspirin, he continues to smoke.  He has a past medical history of chronic low back pain being followed at the pain clinic. He has hypertension not medically managed. He has a history of COPD.  Hospital Course:  The patient was admitted to the hospital and taken to the operating room on 12/08/2015 and underwent: left femoral to anterior tibial bypass with ipsilateral non-reversed greater saphenous vein.   The patient tolerated the procedure well and was  transported to the PACU in stable condition.   The patient had an easily palpable graft pulse post-operatively. His incisions were clean and intact. He had some swelling of his left leg. He is on vancomycin and zosyn for his left great toe infection. He did have some issues with pain management. By POD 4, the patient was mobilizing adequately and his pain was improved. He continued to have a palpable graft pulse and DP pulse. He was discharged home on POD 4 in good condition. He was placed on keflex at discharge. He was to follow up with his pain management physician.   CBC    Component Value Date/Time   WBC 10.5 12/09/2015 0520   RBC 3.33* 12/09/2015 0520   HGB 10.6* 12/09/2015 0520   HCT 32.1* 12/09/2015 0520   PLT 210 12/09/2015 0520   MCV 96.4 12/09/2015 0520   MCH 31.8 12/09/2015 0520   MCHC 33.0 12/09/2015 0520   RDW 13.8 12/09/2015 0520    BMET    Component Value Date/Time   NA 135 12/09/2015 0520   K 3.8 12/09/2015 0520   CL 98* 12/09/2015 0520   CO2 28 12/09/2015 0520   GLUCOSE 103* 12/09/2015 0520   BUN 6 12/09/2015 0520   CREATININE 0.66 12/09/2015 0520   CREATININE 0.69 05/11/2014 1533   CALCIUM 7.9* 12/09/2015 0520   GFRNONAA >60 12/09/2015 0520   GFRNONAA >89 04/19/2014 1418   GFRAA >60 12/09/2015 0520   GFRAA >89 04/19/2014 1418     Discharge Instructions:  The patient is discharged to home with extensive instructions on wound care and progressive ambulation.  They are instructed not to drive or perform any heavy lifting until returning to see the physician in his office.  Discharge Instructions    Call MD for:  redness, tenderness, or signs of infection (pain, swelling, bleeding, redness, odor or green/yellow discharge around incision site)    Complete by:  As directed      Call MD for:  severe or increased pain, loss or decreased feeling  in affected limb(s)    Complete by:  As directed      Call MD for:  temperature >100.5    Complete by:  As directed        Discharge wound care:    Complete by:  As directed   Wash wounds daily with soap and water and pat dry. Do not apply any creams or ointments on your incisions.     Driving Restrictions    Complete by:  As directed   No driving for 2 weeks     Increase activity slowly    Complete by:  As directed   Walk with assistance use walker or cane as needed     Lifting restrictions    Complete by:  As directed   No lifting for 2 weeks     Resume previous diet    Complete by:  As directed            Discharge Diagnosis:  Peripheral vascular disease with left great toe ulcer I70.245  Secondary Diagnosis: Patient Active Problem List   Diagnosis Date Noted  . Pain in joint, lower leg 10/17/2014  . Visit for wound check 02/21/2014  . Encounter for post surgical wound check 12/09/2013  . Wound drainage-Right medial leg 12/09/2013  . Peripheral vascular disease, unspecified (HCC) 11/08/2013  . Leg edema, left, foot 06/22/2013  . Cellulitis of foot, left 06/09/2013  . Non-healing wound of lower extremity, lt great toe 06/09/2013  . Tobacco use 06/09/2013  . Claudication, lifestyle limiting 05/11/2013  . Unexplained weight loss 04/28/2013  . PAD (peripheral artery disease), PTA/stent IDEV & chocolate baloon 06/08/13, Previous PTA/Stent to Rt SFA 05/10/13 04/13/2013  . COPD (chronic obstructive pulmonary disease) (HCC) 04/13/2013  . HTN (hypertension) 04/13/2013  . Anxiety 04/13/2013  . Low back pain 04/13/2013   Past Medical History  Diagnosis Date  . Chronic low back pain     "degenerative spine dx'd ~ 7 yr ago" (06/08/2013)  . COPD (chronic obstructive pulmonary disease) (HCC)   . Anxiety   . Claudication (HCC)   . Unexplained weight loss   . Tobacco abuse   . Hypertension     "moderately high; RX didn't help" (06/08/2013)  . PAD (peripheral artery disease), PTA/stent IDEV & chocolate baloon 06/08/13 04/13/2013    Severely reduced ABI's of 0.3 bilaterally   . Non-healing  wound of lower extremity 06/09/2013  . Tobacco use 06/09/2013  . Emphysema of lung (HCC)   . Shortness of breath     w/ exertion  . Neuromuscular disorder (HCC)     degen spine       Medication List    STOP taking these medications        levofloxacin 500 MG tablet  Commonly known as:  LEVAQUIN      TAKE these medications        aspirin EC 81 MG tablet  Take 81 mg by mouth daily.     cephALEXin 500  MG capsule  Commonly known as:  KEFLEX  Take 1 capsule (500 mg total) by mouth 3 (three) times daily.     clopidogrel 75 MG tablet  Commonly known as:  PLAVIX  take 1 tablet by mouth once daily     gabapentin 300 MG capsule  Commonly known as:  NEURONTIN  Take 1 capsule (300 mg total) by mouth 3 (three) times daily.     oxyCODONE 15 MG immediate release tablet  Commonly known as:  ROXICODONE  Take 15 mg by mouth every 4 (four) hours as needed for pain.        Disposition: home  Patient's condition: is Good  Follow up: 1. Dr. Myra GianottiBrabham in 2 weeks   Maris BergerKimberly Mette [place], PA-C [facility] [phone] 12/18/2015  2:09 PM  - For VQI Registry use --- Instructions: Press F2 to tab through selections.  Delete question if not applicable.   Post-op:  Wound infection: No  Graft infection: No  Transfusion: No New Arrhythmia: No Ipsilateral amputation: No, [ ]  Minor, [ ]  BKA, [ ]  AKA Discharge patency: [x ] Primary, [ ]  Primary assisted, [ ]  Secondary, [ ]  Occluded Patency judged by: [ ]  Dopper only, [x ] Palpable graft pulse, [x ] Palpable distal pulse, [ ]  ABI inc. > 0.15, [ ]  Duplex Discharge ABI: R 0.60, L 0.86 D/C Ambulatory Status: Ambulatory with Assistance  Complications: MI: No, [ ]  Troponin only, [ ]  EKG or Clinical CHF: No Resp failure:No, [ ]  Pneumonia, [ ]  Ventilator Chg in renal function: No, [ ]  Inc. Cr > 0.5, [ ]  Temp. Dialysis, [ ]  Permanent dialysis Stroke: No, [ ]  Minor, [ ]  Major Return to OR: No  Reason for return to OR: [ ]   Bleeding, [ ]  Infection, [ ]  Thrombosis, [ ]  Revision  Discharge medications: Statin use:  no ASA use:  yes Plavix use:  yes Beta blocker use: no Coumadin use: no
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
## 2015-12-25 ENCOUNTER — Ambulatory Visit (INDEPENDENT_AMBULATORY_CARE_PROVIDER_SITE_OTHER): Payer: Medicare Other | Admitting: Podiatry

## 2015-12-25 ENCOUNTER — Encounter: Payer: Self-pay | Admitting: Surgery

## 2015-12-25 ENCOUNTER — Encounter: Payer: Self-pay | Admitting: Podiatry

## 2015-12-25 ENCOUNTER — Other Ambulatory Visit: Payer: Self-pay | Admitting: *Deleted

## 2015-12-25 ENCOUNTER — Ambulatory Visit (INDEPENDENT_AMBULATORY_CARE_PROVIDER_SITE_OTHER): Payer: Medicare Other | Admitting: Surgery

## 2015-12-25 ENCOUNTER — Other Ambulatory Visit: Payer: Self-pay

## 2015-12-25 VITALS — BP 108/73 | HR 109 | Temp 97.3°F | Ht 68.0 in | Wt 114.0 lb

## 2015-12-25 VITALS — BP 111/75 | HR 98 | Resp 16

## 2015-12-25 DIAGNOSIS — L03032 Cellulitis of left toe: Secondary | ICD-10-CM

## 2015-12-25 DIAGNOSIS — R0602 Shortness of breath: Secondary | ICD-10-CM

## 2015-12-25 DIAGNOSIS — I70345 Atherosclerosis of unspecified type of bypass graft(s) of the left leg with ulceration of other part of foot: Secondary | ICD-10-CM

## 2015-12-25 DIAGNOSIS — I70245 Atherosclerosis of native arteries of left leg with ulceration of other part of foot: Secondary | ICD-10-CM

## 2015-12-25 DIAGNOSIS — I999 Unspecified disorder of circulatory system: Secondary | ICD-10-CM

## 2015-12-25 DIAGNOSIS — M79675 Pain in left toe(s): Secondary | ICD-10-CM

## 2015-12-25 DIAGNOSIS — L03012 Cellulitis of left finger: Secondary | ICD-10-CM

## 2015-12-25 MED ORDER — LEVOFLOXACIN 500 MG PO TABS: 500.0000 mg | ORAL_TABLET | Freq: Every day | ORAL | Status: DC

## 2015-12-25 NOTE — Addendum Note (Signed)
Addended by: Melodye PedMANESS-HARRISON, CHANDA C on: 12/25/2015 01:29 PM   Modules accepted: Orders
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
## 2015-12-26 NOTE — Progress Notes (Signed)
Subjective:     Patient ID: Christopher Pena, male   DOB: February 08, 1946, 70 y.o.   MRN: [id number]  HPI patient states she was revascularized and is not having pain in his toe anymore but it is crusted and his vascular doctor wants us evaluating   Review of Systems     Objective:   Physical Exam Neurovascular status is improved on the left foot with digital perfusion with patient noted to have thickened type tissue on the distal hallux with no drainage of an active nature or no proximal erythema edema drainage noted    Assessment:     Doing better after having this procedure    Plan:     Carefully debrided small amount of crusted tissue and did not note any disease and we'll allow the rest of it heal naturally. Patient was given strict instructions on ways to monitor this and cannot wear anything tight against the foot and will be seen back as needed

## 2015-12-27 ENCOUNTER — Encounter (HOSPITAL_COMMUNITY): Payer: Medicare Other

## 2016-01-02 ENCOUNTER — Ambulatory Visit
Admission: RE | Admit: 2016-01-02 | Discharge: 2016-01-02 | Disposition: A | Discharge status: Home / Self Care | Payer: Medicare Other | Source: Ambulatory Visit | Attending: Surgery | Admitting: Surgery

## 2016-01-02 ENCOUNTER — Encounter: Payer: Self-pay | Admitting: Surgery

## 2016-01-02 DIAGNOSIS — R0602 Shortness of breath: Secondary | ICD-10-CM

## 2016-01-08 ENCOUNTER — Encounter: Payer: Self-pay | Admitting: Surgery

## 2016-01-08 ENCOUNTER — Ambulatory Visit (INDEPENDENT_AMBULATORY_CARE_PROVIDER_SITE_OTHER): Payer: Medicare Other | Admitting: Surgery

## 2016-01-08 VITALS — BP 114/73 | HR 97 | Temp 97.5°F | Ht 68.0 in | Wt 115.7 lb

## 2016-01-08 DIAGNOSIS — I70245 Atherosclerosis of native arteries of left leg with ulceration of other part of foot: Secondary | ICD-10-CM

## 2016-01-08 NOTE — Progress Notes (Signed)
   Patient name: Christopher Pena MRN: [id number] DOB: 08-17-1946 Sex: male  REASON FOR VISIT: Post-op  HPI: Christopher Pena is a 70 y.o. male who is status post left femoral to anterior tibial bypass graft on 12/10/2015.  This was done for a wound on his left great toe.  He has a history of a right femoral to below-knee popliteal bypass graft with vein which has occluded.  He was having some trouble with shortness of breath and so I sent her for an x-ray last time.  He states that has improved.  His walking is better.  He has also been seen by podiatry who did some debridement of the toe.  Current Outpatient Prescriptions  Medication Sig Dispense Refill  . aspirin EC 81 MG tablet Take 81 mg by mouth daily.    Marland Kitchen. gabapentin (NEURONTIN) 300 MG capsule Take 1 capsule (300 mg total) by mouth 3 (three) times daily. 90 capsule 3  . levofloxacin (LEVAQUIN) 500 MG tablet Take 1 tablet (500 mg total) by mouth daily. 14 tablet 0  . oxyCODONE (ROXICODONE) 15 MG immediate release tablet Take 15 mg by mouth every 4 (four) hours as needed for pain.    Marland Kitchen. clopidogrel (PLAVIX) 75 MG tablet take 1 tablet by mouth once daily (Patient not taking: Reported on 01/08/2016) 30 tablet 0   No current facility-administered medications for this visit.    REVIEW OF SYSTEMS:  [X]  denotes positive finding, [ ]  denotes negative finding Cardiac  Comments:  Chest pain or chest pressure:    Shortness of breath upon exertion:    Short of breath when lying flat:    Irregular heart rhythm:    Constitutional    Fever or chills:      PHYSICAL EXAM: Filed Vitals:   01/08/16 1109  BP: 114/73  Pulse: 97  Temp: 97.5 F (36.4 C)  TempSrc: Oral  Height: 5\' 8"  (1.727 m)  Weight: 115 lb 11.2 oz (52.481 kg)  SpO2: 100%    GENERAL: The patient is a well-nourished male, in no acute distress. The vital signs are documented above. CARDIOVASCULAR: There is a regular rate and rhythm. PULMONARY: There is good air exchange  bilaterally without wheezing or rales Palpable graft pulse  MEDICAL ISSUES:  THE PATIENT CONTINUES TO DO VERY WELL.  HE HAS EXCELLENT PULSE WITHIN HIS GRAFT.  FOLLOW-UP IN 3 MONTHS WITH A DUPLEX.  I AM ALSO TRYING TO FACILITATE GETTING HIM A PRIMARY CARE PHYSICIAN.  Durene CalWells Belita Warsame. MD [facility] [place] Tel: [phone] Pager: [phone]

## 2016-01-11 DIAGNOSIS — Z79891 Long term (current) use of opiate analgesic: Secondary | ICD-10-CM | POA: Diagnosis not present

## 2016-01-11 DIAGNOSIS — M545 Low back pain: Secondary | ICD-10-CM | POA: Diagnosis not present

## 2016-01-11 DIAGNOSIS — M79605 Pain in left leg: Secondary | ICD-10-CM | POA: Diagnosis not present

## 2016-01-11 DIAGNOSIS — M79604 Pain in right leg: Secondary | ICD-10-CM | POA: Diagnosis not present

## 2016-01-11 DIAGNOSIS — G894 Chronic pain syndrome: Secondary | ICD-10-CM | POA: Diagnosis not present

## 2016-01-12 ENCOUNTER — Telehealth: Payer: Self-pay | Admitting: Surgery

## 2016-01-12 NOTE — Telephone Encounter (Signed)
[facility] to see [facility] Dr. Beverely Lowabori on 04/08/16 at 1:30. They will fill med requests and see him for sickness prior to that if he calls. Patient verbalized understanding.

## 2016-02-08 DIAGNOSIS — G894 Chronic pain syndrome: Secondary | ICD-10-CM | POA: Diagnosis not present

## 2016-02-08 DIAGNOSIS — Z79891 Long term (current) use of opiate analgesic: Secondary | ICD-10-CM | POA: Diagnosis not present

## 2016-02-08 DIAGNOSIS — M5418 Radiculopathy, sacral and sacrococcygeal region: Secondary | ICD-10-CM | POA: Diagnosis not present

## 2016-02-08 DIAGNOSIS — M5431 Sciatica, right side: Secondary | ICD-10-CM | POA: Diagnosis not present

## 2016-02-08 DIAGNOSIS — G890 Central pain syndrome: Secondary | ICD-10-CM | POA: Diagnosis not present

## 2016-02-08 DIAGNOSIS — M5416 Radiculopathy, lumbar region: Secondary | ICD-10-CM | POA: Diagnosis not present

## 2016-03-07 DIAGNOSIS — M5416 Radiculopathy, lumbar region: Secondary | ICD-10-CM | POA: Diagnosis not present

## 2016-03-07 DIAGNOSIS — Z79891 Long term (current) use of opiate analgesic: Secondary | ICD-10-CM | POA: Diagnosis not present

## 2016-03-07 DIAGNOSIS — M5418 Radiculopathy, sacral and sacrococcygeal region: Secondary | ICD-10-CM | POA: Diagnosis not present

## 2016-03-07 DIAGNOSIS — G541 Lumbosacral plexus disorders: Secondary | ICD-10-CM | POA: Diagnosis not present

## 2016-03-07 DIAGNOSIS — G894 Chronic pain syndrome: Secondary | ICD-10-CM | POA: Diagnosis not present

## 2016-03-07 DIAGNOSIS — G603 Idiopathic progressive neuropathy: Secondary | ICD-10-CM | POA: Diagnosis not present

## 2016-03-19 ENCOUNTER — Other Ambulatory Visit: Payer: Self-pay | Admitting: *Deleted

## 2016-03-19 DIAGNOSIS — I739 Peripheral vascular disease, unspecified: Secondary | ICD-10-CM

## 2016-04-05 ENCOUNTER — Encounter: Payer: Self-pay | Admitting: Emergency Medicine

## 2016-04-05 ENCOUNTER — Telehealth: Payer: Self-pay | Admitting: Emergency Medicine

## 2016-04-05 NOTE — Telephone Encounter (Signed)
Pre-Visit Call completed with patient and chart updated.   Pre-Visit Info documented in Specialty Comments under Snap[employer].    

## 2016-04-08 ENCOUNTER — Ambulatory Visit (INDEPENDENT_AMBULATORY_CARE_PROVIDER_SITE_OTHER): Payer: Medicare Other | Admitting: Family Medicine

## 2016-04-08 ENCOUNTER — Encounter: Payer: Self-pay | Admitting: Family Medicine

## 2016-04-08 VITALS — BP 116/82 | HR 82 | Temp 98.2°F | Resp 17 | Ht 68.0 in | Wt 108.1 lb

## 2016-04-08 DIAGNOSIS — R634 Abnormal weight loss: Secondary | ICD-10-CM

## 2016-04-08 DIAGNOSIS — E43 Unspecified severe protein-calorie malnutrition: Secondary | ICD-10-CM | POA: Diagnosis not present

## 2016-04-08 DIAGNOSIS — J449 Chronic obstructive pulmonary disease, unspecified: Secondary | ICD-10-CM | POA: Diagnosis not present

## 2016-04-08 LAB — BASIC METABOLIC PANEL
BUN: 19 mg/dL (ref 6–23)
CALCIUM: 9.5 mg/dL (ref 8.4–10.5)
CO2: 29 meq/L (ref 19–32)
CREATININE: 0.67 mg/dL (ref 0.40–1.50)
Chloride: 98 mEq/L (ref 96–112)
GFR: 124.71 mL/min (ref >60.00)
GLUCOSE: 106 mg/dL — AB (ref 70–99)
Potassium: 4.7 mEq/L (ref 3.5–5.1)
SODIUM: 138 meq/L (ref 135–145)

## 2016-04-08 LAB — HEPATIC FUNCTION PANEL
ALBUMIN: 4.5 g/dL (ref 3.5–5.2)
ALK PHOS: 60 U/L (ref 39–117)
ALT: 14 U/L (ref 0–53)
AST: 19 U/L (ref 0–37)
Bilirubin, Direct: 0.1 mg/dL (ref 0.0–0.3)
TOTAL PROTEIN: 8 g/dL (ref 6.0–8.3)
Total Bilirubin: 0.4 mg/dL (ref 0.2–1.2)

## 2016-04-08 LAB — CBC WITH DIFFERENTIAL/PLATELET
BASOS PCT: 0.5 % (ref 0.0–3.0)
Basophils Absolute: 0 10*3/uL (ref 0.0–0.1)
EOS PCT: 1 % (ref 0.0–5.0)
Eosinophils Absolute: 0.1 10*3/uL (ref 0.0–0.7)
HCT: 44.8 % (ref 39.0–52.0)
Hemoglobin: 15.1 g/dL (ref 13.0–17.0)
LYMPHS ABS: 1.6 10*3/uL (ref 0.7–4.0)
Lymphocytes Relative: 16.4 % (ref 12.0–46.0)
MCHC: 33.6 g/dL (ref 30.0–36.0)
MCV: 94.4 fl (ref 78.0–100.0)
Monocytes Absolute: 0.7 10*3/uL (ref 0.1–1.0)
Monocytes Relative: 6.8 % (ref 3.0–12.0)
NEUTROS ABS: 7.5 10*3/uL (ref 1.4–7.7)
NEUTROS PCT: 75.3 % (ref 43.0–77.0)
Platelets: 268 10*3/uL (ref 150.0–400.0)
RBC: 4.74 Mil/uL (ref 4.22–5.81)
RDW: 14.9 % (ref 11.5–15.5)
WBC: 9.9 10*3/uL (ref 4.0–10.5)

## 2016-04-08 LAB — TSH: TSH: 3.02 u[IU]/mL (ref 0.35–4.50)

## 2016-04-08 MED ORDER — PREDNISONE 10 MG PO TABS: ORAL_TABLET | ORAL | 0 refills | Status: DC

## 2016-04-08 MED ORDER — FLUTICASONE PROPIONATE 50 MCG/ACT NA SUSP: 2.0000 | Freq: Every day | NASAL | 6 refills | Status: DC

## 2016-04-08 MED ORDER — ALBUTEROL SULFATE HFA 108 (90 BASE) MCG/ACT IN AERS: 2.0000 | INHALATION_SPRAY | RESPIRATORY_TRACT | 6 refills | Status: DC | PRN

## 2016-04-08 MED ORDER — ALBUTEROL SULFATE (2.5 MG/3ML) 0.083% IN NEBU
2.5000 mg | INHALATION_SOLUTION | Freq: Once | RESPIRATORY_TRACT | Status: AC
  Administered 2016-04-08: 2.5 mg via RESPIRATORY_TRACT

## 2016-04-08 NOTE — Assessment & Plan Note (Signed)
New.  This dx was made based on changes seen on his recent CXR.  He has never seen pulmonary or had formal PFTs.  Wheezing improved s/p neb tx in office.  Start albuterol inhaler prn.  Pt to complete pred taper.  Repeat CXR.  Refer to pulm for complete evaluation and tx.  Pt expressed understanding and is in agreement w/ plan.

## 2016-04-08 NOTE — Assessment & Plan Note (Signed)
New.  See unexplained weight loss above.  Continue Ensure regularly to try and build protein stores.  Pt expressed understanding and is in agreement w/ plan.

## 2016-04-08 NOTE — Patient Instructions (Signed)
Schedule your complete physical in 3-4 months We'll notify you of your lab results and make any changes if needed [facility] [address] to get your chest xray done We'll call you with your pulmonary appt Start the Prednisone as directed- take w/ food- to improve your wheezing and air movement Continue the Ensure for additional protein Use the Albuterol inhaler- 2 puffs every 4-6 hrs as needed for cough, shortness of breath, wheezing Call with any questions or concerns Welcome!  We're glad to have you!!

## 2016-04-08 NOTE — Assessment & Plan Note (Signed)
New.  Pt is down to 108 lbs.  He reports loss of appetite and when he does eat, he develops nausea.  Pt is drinking ensure regularly.  Concerned for malignancy given his extensive smoking hx.  Check labs.  Repeat CXR.  Will follow closely.

## 2016-04-08 NOTE — Progress Notes (Signed)
   Subjective:    Patient ID: Christopher Pena, male    DOB: [birth year], 70 y.o.   MRN: [id number]  HPI New to establish.  Previous MD- no recent PCP (>10 yrs)  Cards- Dr Allyson SabalBerry  Vascular- Dr Myra GianottiBrabham  Weight Loss- pt reports he has lost ~80 lbs in the last 4 yrs.  'i can't gain the weight back'.  + anorexia, no appetite, 'i get about 1/2 sick from it if I try and eat'.  Pt is drinking Ensure.  SOB- pt had CXR done May 2017 that showed 'chronic changes consistent w/ COPD'.  Pt reports ongoing SOB.  Having increased anxiety due to SOB.  Pt has >50 pack year smoking history.  Pt reports his SOB has progressed from SOB w/ exertion to SOB at rest w/ talking.  Pt reports increased wheezing w/ cough.  Cough is intermittently productive of clear sputum.  Pt reports he will wake up 'almost gasping' for breath   Review of Systems For ROS see HPI     Objective:   Physical Exam  Constitutional: He is oriented to person, place, and time.  Cachectic, frail, appears older than stated age  HENT:  Head: Normocephalic and atraumatic.  Nose: Nose normal.  Copious PND TMs WNL bilaterally  Neck: Normal range of motion. Neck supple.  Cardiovascular: Normal rate, regular rhythm and normal heart sounds.   Pulmonary/Chest: No respiratory distress. He has wheezes.  Scattered wheezes, R>L.  Improved s/p neb tx  Musculoskeletal: He exhibits no edema or tenderness.  Lymphadenopathy:    He has no cervical adenopathy.  Neurological: He is alert and oriented to person, place, and time. No cranial nerve deficit. Coordination normal.  Skin: Skin is warm and dry.  Psychiatric: He has a normal mood and affect. His behavior is normal. Thought content normal.  Vitals reviewed.         Assessment & Plan:

## 2016-04-08 NOTE — Progress Notes (Signed)
Pre visit review using our clinic review tool, if applicable. No additional management support is needed unless otherwise documented below in the visit note. 

## 2016-04-10 ENCOUNTER — Encounter: Payer: Self-pay | Admitting: Surgery

## 2016-04-11 DIAGNOSIS — G894 Chronic pain syndrome: Secondary | ICD-10-CM | POA: Diagnosis not present

## 2016-04-11 DIAGNOSIS — M5418 Radiculopathy, sacral and sacrococcygeal region: Secondary | ICD-10-CM | POA: Diagnosis not present

## 2016-04-11 DIAGNOSIS — M79605 Pain in left leg: Secondary | ICD-10-CM | POA: Diagnosis not present

## 2016-04-11 DIAGNOSIS — M5416 Radiculopathy, lumbar region: Secondary | ICD-10-CM | POA: Diagnosis not present

## 2016-04-11 DIAGNOSIS — Z79891 Long term (current) use of opiate analgesic: Secondary | ICD-10-CM | POA: Diagnosis not present

## 2016-04-11 DIAGNOSIS — M545 Low back pain: Secondary | ICD-10-CM | POA: Diagnosis not present

## 2016-04-15 ENCOUNTER — Ambulatory Visit (INDEPENDENT_AMBULATORY_CARE_PROVIDER_SITE_OTHER): Payer: Medicare Other | Admitting: Surgery

## 2016-04-15 ENCOUNTER — Ambulatory Visit (INDEPENDENT_AMBULATORY_CARE_PROVIDER_SITE_OTHER)
Admission: RE | Admit: 2016-04-15 | Discharge: 2016-04-15 | Disposition: A | Discharge status: Home / Self Care | Payer: Medicare Other | Source: Ambulatory Visit | Attending: Surgery | Admitting: Surgery

## 2016-04-15 ENCOUNTER — Ambulatory Visit (HOSPITAL_COMMUNITY)
Admission: RE | Admit: 2016-04-15 | Discharge: 2016-04-15 | Disposition: A | Discharge status: Home / Self Care | Payer: Medicare Other | Source: Ambulatory Visit | Attending: Surgery | Admitting: Surgery

## 2016-04-15 ENCOUNTER — Encounter: Payer: Self-pay | Admitting: Surgery

## 2016-04-15 VITALS — BP 102/75 | HR 80 | Temp 97.2°F | Resp 16 | Ht 65.0 in | Wt 113.0 lb

## 2016-04-15 DIAGNOSIS — I739 Peripheral vascular disease, unspecified: Secondary | ICD-10-CM

## 2016-04-15 DIAGNOSIS — L97529 Non-pressure chronic ulcer of other part of left foot with unspecified severity: Secondary | ICD-10-CM | POA: Insufficient documentation

## 2016-04-15 DIAGNOSIS — Z95828 Presence of other vascular implants and grafts: Secondary | ICD-10-CM | POA: Insufficient documentation

## 2016-04-15 DIAGNOSIS — F17200 Nicotine dependence, unspecified, uncomplicated: Secondary | ICD-10-CM | POA: Insufficient documentation

## 2016-04-15 DIAGNOSIS — I7025 Atherosclerosis of native arteries of other extremities with ulceration: Secondary | ICD-10-CM

## 2016-04-15 DIAGNOSIS — L97209 Non-pressure chronic ulcer of unspecified calf with unspecified severity: Secondary | ICD-10-CM | POA: Diagnosis not present

## 2016-04-15 NOTE — Progress Notes (Signed)
HISTORY AND PHYSICAL     CC:  Follow up for bypass Referring Provider:  No ref. provider found  HPI: This is a 70 y.o. male who is s/p left femoral to AT bypass on 12/10/15 with saphenous vein by Dr. Myra Gianotti, which was done for non healing wound left great toe.  He does have a hx of a right femoral to below knee popliteal bypass graft with vein, which has occluded.  He states that he is able to get around better than before the surgery.  He says that the right leg bothers him a little bit, but doesn't keep him from his activities.  He says that his left leg is doing great.  He says the ulcer on his left great toe is getting better and that he wound soak it to get the ulcer to fall off, but is scared about what will be underneath and fears pain.    He still has complaints of shortness of breath.  He continues to smoke and states this aggravates his SOB.  He states that it does get better until he smokes the next cigarette.  He says that 2nd hand smoke also aggravates it.  He says he had an appointment with a PCP last Monday at which time he did get albuterol and this has also given him some relief with his SOB.  He has an appointment with Pulmonary MD on September 9th.    He takes a daily aspirin.     Past Medical History:  Diagnosis Date  . Anxiety   . Chronic low back pain    "degenerative spine dx'd ~ 7 yr ago" (06/08/2013)  . Claudication (HCC)   . COPD (chronic obstructive pulmonary disease) (HCC)   . Emphysema of lung (HCC)   . Hypertension    "moderately high; RX didn't help" (06/08/2013)  . Neuromuscular disorder (HCC)    degen spine  . Non-healing wound of lower extremity 06/09/2013  . PAD (peripheral artery disease), PTA/stent IDEV & chocolate baloon 06/08/13 04/13/2013   Severely reduced ABI's of 0.3 bilaterally   . Shortness of breath    w/ exertion  . Tobacco abuse   . Tobacco use 06/09/2013  . Unexplained weight loss     Past Surgical History:  Procedure Laterality  Date  . ABDOMINAL AORTAGRAM  05/10/2013   Procedure: ABDOMINAL Ronny Flurry;  Surgeon: Runell Gess, MD;  Location: [facility] CATH LAB;  Service: Cardiovascular;;  . ABI     04/09/13; abnormal  . APPENDECTOMY  05/2001  . FEMORAL ARTERY STENT Right 05/10/13   2 stents Rt SFA  . FEMORAL ARTERY STENT Left 06/08/2013  . FEMORAL-POPLITEAL BYPASS GRAFT Right 11/12/2013   Procedure: RIGHT  FEMORAL-BELOW KNEE POPLITEAL ARTERY BYPASS GRAFT USING NON- REVERSE RIGHT GREATER SAPHENOUS VEIN.;  Surgeon: Nada Libman, MD;  Location: [facility] OR;  Service: Vascular;  Laterality: Right;  . FEMORAL-TIBIAL BYPASS GRAFT Left 12/08/2015   Procedure:  LEFT FEMORAL-ANTERIOR TIBIAL ARTERY BYPASS GRAFT;  Surgeon: Nada Libman, MD;  Location: [facility] OR;  Service: Vascular;  Laterality: Left;  . LOWER EXTREMITY ANGIOGRAM Bilateral 05/10/2013   Procedure: LOWER EXTREMITY ANGIOGRAM;  Surgeon: Runell Gess, MD;  Location: [facility] CATH LAB;  Service: Cardiovascular;  Laterality: Bilateral;  . LOWER EXTREMITY ANGIOGRAM N/A 06/08/2013   Procedure: LOWER EXTREMITY ANGIOGRAM;  Surgeon: Runell Gess, MD;  Location: [facility] CATH LAB;  Service: Cardiovascular;  Laterality: N/A;  . LOWER EXTREMITY ANGIOGRAM N/A 10/28/2013   Procedure: LOWER EXTREMITY ANGIOGRAM;  Surgeon:  Runell GessJonathan J Berry, MD;  [facility];  Service: Cardiovascular;  Laterality: N/A;  . LOWER EXTREMITY ANGIOGRAM N/A 05/16/2014   Procedure: LOWER EXTREMITY ANGIOGRAM;  Surgeon: Runell GessJonathan J Berry, MD;  [facility];  Service: Cardiovascular;  Laterality: N/A;  . PERCUTANEOUS STENT INTERVENTION Right 05/10/2013   Procedure: PERCUTANEOUS STENT INTERVENTION;  Surgeon: Runell GessJonathan J Berry, MD;  [facility];  Service: Cardiovascular;  Laterality: Right;  rt sfa and popliteal stent x2  . PERIPHERAL VASCULAR CATHETERIZATION N/A 12/06/2015   Procedure: Abdominal Aortogram w/Lower Extremity;  Surgeon: Nada LibmanVance W Jehieli Brassell, MD;  Location: [facility] INVASIVE CV LAB;  Service:  Cardiovascular;  Laterality: N/A;  . VEIN HARVEST Left 12/08/2015   Procedure: USING NON REVERSE  LEFT GREATER SAPHENOUS VEIN;  Surgeon: Nada LibmanVance W Brizza Nathanson, MD;  Location: [facility] OR;  Service: Vascular;  Laterality: Left;    Allergies  Allergen Reactions  . Clindamycin/Lincomycin Diarrhea    Current Outpatient Prescriptions  Medication Sig Dispense Refill  . albuterol (PROAIR HFA) 108 (90 Base) MCG/ACT inhaler Inhale 2 puffs into the lungs every 4 (four) hours as needed for wheezing or shortness of breath. 1 Inhaler 6  . aspirin EC 81 MG tablet Take 81 mg by mouth daily.    . fluticasone (FLONASE) 50 MCG/ACT nasal spray Place 2 sprays into both nostrils daily. 16 g 6  . gabapentin (NEURONTIN) 300 MG capsule Take 1 capsule (300 mg total) by mouth 3 (three) times daily. 90 capsule 3  . oxyCODONE (ROXICODONE) 15 MG immediate release tablet Take 15 mg by mouth every 4 (four) hours as needed for pain.    . predniSONE (DELTASONE) 10 MG tablet 3 tabs x3 days and then 2 tabs x3 days and then 1 tab x3 days.  Take w/ food. 18 tablet 0   No current facility-administered medications for this visit.     Family History  Problem Relation Age of Onset  . Hyperlipidemia Mother   . Heart disease Mother   . Diabetes Mother   . Cancer Father     Liver    Social History   Social History  . Marital status: Divorced    Spouse name: N/A  . Number of children: N/A  . Years of education: N/A   Occupational History  . former golf pro    Social History Main Topics  . Smoking status: Light Tobacco Smoker    Packs/day: 0.25    Years: 50.00    Types: Cigarettes    Start date: 07/03/1963  . Smokeless tobacco: Never Used     Comment: states "he is ready to quit"  . Alcohol use No     Comment: 06/08/2013 "quit 02/2007; drank my share before that though"  . Drug use: No  . Sexual activity: Yes   Other Topics Concern  . Not on file   Social History Narrative  . No narrative on file     REVIEW OF  SYSTEMS:   [X]  denotes positive finding, [ ]  denotes negative finding Cardiac  Comments:  Chest pain or chest pressure:    Shortness of breath upon exertion: x   Short of breath when lying flat:    Irregular heart rhythm:        Vascular    Pain in calf, thigh, or hip brought on by ambulation:    Pain in feet at night that wakes you up from your sleep:     Blood clot in your veins:    Leg swelling:  Pulmonary    Oxygen at home:    Productive cough:  x   Wheezing:  x       Neurologic    Sudden weakness in arms or legs:     Sudden numbness in arms or legs:     Sudden onset of difficulty speaking or slurred speech:    Temporary loss of vision in one eye:     Problems with dizziness:         Gastrointestinal    Blood in stool:     Vomited blood:         Genitourinary    Burning when urinating:     Blood in urine:        Psychiatric    Major depression:         Hematologic    Bleeding problems:    Problems with blood clotting too easily:        Skin    Rashes or ulcers:        Constitutional    Fever or chills:      PHYSICAL EXAMINATION:  Vitals:   04/15/16 1321  BP: 102/75  Pulse: 80  Resp: 16  Temp: 97.2 F (36.2 C)   Body mass index is 18.8 kg/m.  General:  WDWN in NAD; vital signs documented above Gait: Not observed HENT: WNL, normocephalic Pulmonary: normal non-labored breathing , without Rales, rhonchi,  wheezing Cardiac: regular HR, without  Murmurs, rubs or gallops; without carotid bruits Abdomen: soft, NT, easily palpable aorta Skin: without rashes Vascular Exam/Pulses:  Right Left  Radial 2+ (normal) 2+ (normal)  Ulnar Unable to palpate  Unable to palpate   Femoral 2+ (normal) 2+ (normal)  Graft Pulse -- 2+ (normal)  DP Unable to palpate  Unable to palpate   PT Unable to palpate  Unable to palpate    Extremities: without ischemic changes, without Gangrene , without cellulitis; without open wounds; dry ulcer distal left  great toe Musculoskeletal: no muscle wasting or atrophy  Neurologic: A&O X 3;  No focal weakness or paresthesias are detected Psychiatric:  The pt has Normal affect.   Non-Invasive Vascular Imaging:   ABI's 04/15/16: Right:  0.89 Left:  0.61 --unable to obtain toe pressure due to absent waveform on the right and due to ulcer on the left great toe  Previous ABI's 11/27/15: Right:  0.74 Left:  0.69  AAA duplex April 2017: 3.6 x 3.6 mid aortic aneurysm with bilateral common iliac artery aneurysm   Pt meds includes: Statin:  No. Beta Blocker:  No. Aspirin:  Yes.   ACEI:  No. ARB:  No. Other Antiplatelet/Anticoagulant:  No.    ASSESSMENT/PLAN:: 70 y.o. male s/p left femoral to AT bypass on 12/10/15 with saphenous vein by Dr. Myra Gianotti, which was done for non healing wound left great toe.  He does have a hx of a right femoral to below knee popliteal bypass graft with vein, which has occluded.   -pt is doing well from his surgery.  He is able to get around and walk as he needs to without difficulty. -he continues to have dry ulcer on left great toe, which is getting better. -he does have a palpable aorta and AAA duplex in April revealed 3.6cm aneurysm. -He continues to have shortness of breath-he continues to smoke, which we had a discussion about the importance of smoking cessation.  He states that his shortness of breath does improve until he smokes another cigarette or smells 2nd hand smoke.  I did discuss with him the number of 1-800-QUIT-NOW.  He did see PCP last week and was given albuterol, which has helped.  He does have an appointment with a pulmonologist on April 27, 2016.   -he will follow up in April 2018 for AAA duplex, ABI's and left leg graft duplex. -he will call sooner if he has any issues.    Doreatha Massed, PA-C [facility] [phone]  Clinic MD:  Pt seen and examined in conjunction with Dr. Myra Gianotti   I agree with the above.  I have seen  and evaluated the patient.  He is status post left femoral anterior tibial artery bypass graft for a left great toe ulcer.  He still has a dry eschar on the tip of his foot.  He states that when he soaks it in water that he gets soft.  He is afraid for her to come off because of how much pain he is having when there was an open wound.  I have recommended evaluating this with podiatry.  The patient is scheduled for follow-up April 2018 for his aneurysm as well as surveillance of his bypass graft  Durene Cal

## 2016-04-30 ENCOUNTER — Telehealth: Payer: Self-pay

## 2016-04-30 ENCOUNTER — Ambulatory Visit (INDEPENDENT_AMBULATORY_CARE_PROVIDER_SITE_OTHER): Payer: Medicare Other | Admitting: Internal Medicine

## 2016-04-30 ENCOUNTER — Encounter: Payer: Self-pay | Admitting: Internal Medicine

## 2016-04-30 VITALS — BP 94/60 | HR 104 | Ht 65.5 in | Wt 116.2 lb

## 2016-04-30 DIAGNOSIS — J449 Chronic obstructive pulmonary disease, unspecified: Secondary | ICD-10-CM | POA: Diagnosis not present

## 2016-04-30 DIAGNOSIS — F17210 Nicotine dependence, cigarettes, uncomplicated: Secondary | ICD-10-CM

## 2016-04-30 MED ORDER — FLUTICASONE FUROATE-VILANTEROL 100-25 MCG/INH IN AEPB: 1.0000 | INHALATION_SPRAY | Freq: Every day | RESPIRATORY_TRACT | 0 refills | Status: DC

## 2016-04-30 MED ORDER — FLUTICASONE FUROATE-VILANTEROL 100-25 MCG/INH IN AEPB: 1.0000 | INHALATION_SPRAY | Freq: Every day | RESPIRATORY_TRACT | 11 refills | Status: DC

## 2016-04-30 NOTE — Telephone Encounter (Signed)
Pt. walked in to clinic to discuss symptoms.  Reported since he saw Dr. Myra GianottiBrabham 2 weeks ago, he has been having pain in the left calf with walking, that eases up with rest.  Also, reported he noticed a knot on a vein above the left knee.  Reported the left foot stays cool.   Stated he gets intermittent pain in the left great toe, at rest.  Denied rest pain in the left leg.  Noted dependent rubor of left LE, with pt. sitting in a chair @ this time.  Noted a dried scab on the end of left great toe.  Denied any other sores.  Stated "something has changed with this leg since I saw Dr. Myra GianottiBrabham."  ABI's done 8/28 and were 0.89 (L) and 0.61 (R).  Advised will make Dr. Myra GianottiBrabham aware of his symptoms, and will contact pt. with recommendations.  Agreed.
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
## 2016-04-30 NOTE — Patient Instructions (Signed)
The key is to stop smoking completely before smoking completely stops you!   Plan A = Automatic = BREO one click each am   Plan B = Backup Only use your albuterol as a rescue medication to be used if you can't catch your breath by resting or doing a relaxed purse lip breathing pattern.  - The less you use it, the better it will work when you need it. - Ok to use the inhaler up to 2 puffs  every 4 hours if you must but call for appointment if use goes up over your usual need - Don't leave home without it !!  (think of it like the spare tire for your car)   Please schedule a follow up office visit in 6 weeks, call sooner if needed with full pfts - ok to push back if needed

## 2016-05-01 NOTE — Assessment & Plan Note (Signed)

## 2016-05-01 NOTE — Assessment & Plan Note (Signed)
Spirometry 04/30/2016  FEV1 1.23 (49%)  Ratio 67 - 04/30/2016  Walked RA x 3 laps @ 185 ft each stopped due to  End of study, fast pace pace, sob with desats at 88%    - 04/30/2016 rec trial of BREO   Overuse of saba at baseline and still smoking (see separate a/p)   rx for copd other than quit smoking is directed to symptoms which are not well controlled on saba and needs either LAMA/LABA or LABA/ ICS per guidelines but formulary restriction/ compliance is likely to be a big issue and only had samples of BREO to demonstrate use today so rec trial of BREO and f/u in 6 weeks  Total time devoted to counseling  = 35/142m review case with pt/ discussion of options/alternatives/ personally creating written instructions  in presence of pt  then going over those specific  Instructions directly with the pt including how to use all of the meds but in particular covering each new medication in detail and the difference between the maintenance/automatic meds and the prns using an action plan format for the latter.

## 2016-05-06 NOTE — Telephone Encounter (Signed)
I scheduled an appt for 05/20/16 at 8:30am w/ [facility] for this patient. He is aware of this appt. I also instructed the patient that I would call him if we have any cancellations on 05/13/16. awt

## 2016-05-06 NOTE — Telephone Encounter (Signed)
RE: need next step for pt.  Received: 3 days ago  Message Contents  Nada LibmanVance W Brabham, MD  Phillips Odorarol S Anneka Studer, RN        Schedule him to see me

## 2016-05-09 DIAGNOSIS — G603 Idiopathic progressive neuropathy: Secondary | ICD-10-CM | POA: Diagnosis not present

## 2016-05-09 DIAGNOSIS — G894 Chronic pain syndrome: Secondary | ICD-10-CM | POA: Diagnosis not present

## 2016-05-09 DIAGNOSIS — Z79891 Long term (current) use of opiate analgesic: Secondary | ICD-10-CM | POA: Diagnosis not present

## 2016-05-09 DIAGNOSIS — M5416 Radiculopathy, lumbar region: Secondary | ICD-10-CM | POA: Diagnosis not present

## 2016-05-09 DIAGNOSIS — M5418 Radiculopathy, sacral and sacrococcygeal region: Secondary | ICD-10-CM | POA: Diagnosis not present

## 2016-05-09 DIAGNOSIS — G541 Lumbosacral plexus disorders: Secondary | ICD-10-CM | POA: Diagnosis not present

## 2016-05-13 ENCOUNTER — Ambulatory Visit (INDEPENDENT_AMBULATORY_CARE_PROVIDER_SITE_OTHER): Payer: Medicare Other | Admitting: Family Medicine

## 2016-05-13 ENCOUNTER — Encounter: Payer: Self-pay | Admitting: Family Medicine

## 2016-05-13 VITALS — BP 92/60 | HR 92 | Temp 98.1°F | Resp 16 | Ht 66.0 in | Wt 110.5 lb

## 2016-05-13 DIAGNOSIS — J309 Allergic rhinitis, unspecified: Secondary | ICD-10-CM | POA: Insufficient documentation

## 2016-05-13 DIAGNOSIS — Z23 Encounter for immunization: Secondary | ICD-10-CM | POA: Diagnosis not present

## 2016-05-13 DIAGNOSIS — J3089 Other allergic rhinitis: Secondary | ICD-10-CM

## 2016-05-13 DIAGNOSIS — R634 Abnormal weight loss: Secondary | ICD-10-CM

## 2016-05-13 DIAGNOSIS — E43 Unspecified severe protein-calorie malnutrition: Secondary | ICD-10-CM | POA: Diagnosis not present

## 2016-05-13 LAB — PSA, MEDICARE: PSA: 0.25 ng/mL (ref 0.10–4.00)

## 2016-05-13 MED ORDER — CETIRIZINE HCL 10 MG PO TABS: 10.0000 mg | ORAL_TABLET | Freq: Every day | ORAL | 11 refills | Status: DC

## 2016-05-13 NOTE — Assessment & Plan Note (Signed)
New.  Pt's PND and eustachian tube sxs are most likely due to inadequately treated seasonal allergies.  Start Zyrtec in addition to [employer] IndustriesFlonase.  Will continue to follow.

## 2016-05-13 NOTE — Progress Notes (Signed)
Pre visit review using our clinic review tool, if applicable. No additional management support is needed unless otherwise documented below in the visit note. 

## 2016-05-13 NOTE — Progress Notes (Signed)
   Subjective:    Patient ID: Christopher Pena, male    DOB: Feb 11, 1946, 70 y.o.   MRN: [id number]  HPI Weight loss- pt is up to 115 from 108 when I saw him on 8/21.  He is drinking store brand version of Ensure once daily.  Pt is 'trying to eat'- Malawiturkey sandwiches, bananas, some soup, canned beans.  Has PB but has not opened it.  Pt has smoked for over 50 yrs, continues to smoke but 'very little'.  'my sinuses'- + drainage, congestion.  Bilateral eustachian tube dysfxn.  Denies facial pain, HA.  Using Flonase daily.  Not currently taking antihistamine.     Review of Systems For ROS see HPI     Objective:   Physical Exam  Constitutional: He is oriented to person, place, and time. He appears well-developed and well-nourished. No distress.  Cachectic appearing  HENT:  Head: Normocephalic and atraumatic.  No TTP over sinuses + turbinate edema + PND TMs normal bilaterally  Eyes: Conjunctivae and EOM are normal. Pupils are equal, round, and reactive to light.  Neck: Normal range of motion. Neck supple.  Cardiovascular: Normal rate, regular rhythm and normal heart sounds.   Pulmonary/Chest: Effort normal and breath sounds normal. No respiratory distress. He has no wheezes.  Lymphadenopathy:    He has no cervical adenopathy.  Neurological: He is alert and oriented to person, place, and time.  Skin: Skin is warm and dry.  Psychiatric: He has a normal mood and affect. His behavior is normal. Thought content normal.  Vitals reviewed.         Assessment & Plan:

## 2016-05-13 NOTE — Patient Instructions (Signed)
Follow up as schedule for your physical exam We'll notify you of your lab results and make any changes if needed We'll call you with your appt for the CT scan of your chest Continue to drink the Ensure at least once daily Continue to get at least 3 meals daily and please add snacks, too! Start the Zyrtec once daily in addition to the Flonase- I sent a prescription for you to compare which is cheaper (OTC vs prescription) Call with any questions or concerns Hang in there!!!

## 2016-05-13 NOTE — Assessment & Plan Note (Signed)
Pt has gained 7 lbs since last visit w/ me but remains cachectic appearing.  Recent labs WNL.  Check Pre-Albumin.  Encouraged Ensure at least once daily.  Will continue to follow.

## 2016-05-13 NOTE — Addendum Note (Signed)
Addended by: Geannie RisenBRODMERKEL, JESSICA L on: 05/13/2016 11:17 AM   Modules accepted: Orders

## 2016-05-13 NOTE — Assessment & Plan Note (Signed)
Ongoing for pt.  Primary concern given his smoking hx is malignancy.  Will get CT chest to assess and PSA as it has been a year (but was normal last year).  Recent labs WNL.  Check Pre-Albumin to assess protein stores.  Will follow closely.

## 2016-05-14 LAB — PREALBUMIN: PREALBUMIN: 18 mg/dL — AB (ref 21–43)

## 2016-05-15 ENCOUNTER — Encounter: Payer: Self-pay | Admitting: Surgery

## 2016-05-20 ENCOUNTER — Ambulatory Visit (INDEPENDENT_AMBULATORY_CARE_PROVIDER_SITE_OTHER): Payer: Medicare Other | Admitting: Surgery

## 2016-05-20 ENCOUNTER — Encounter: Payer: Self-pay | Admitting: Surgery

## 2016-05-20 ENCOUNTER — Other Ambulatory Visit: Payer: Self-pay

## 2016-05-20 ENCOUNTER — Encounter (HOSPITAL_COMMUNITY): Payer: Self-pay | Admitting: General Practice

## 2016-05-20 VITALS — BP 120/76 | HR 77 | Temp 98.6°F | Resp 22 | Ht 66.0 in | Wt 118.6 lb

## 2016-05-20 DIAGNOSIS — I70212 Atherosclerosis of native arteries of extremities with intermittent claudication, left leg: Secondary | ICD-10-CM | POA: Diagnosis not present

## 2016-05-20 NOTE — Progress Notes (Signed)
.                                     [facility] Specialist of [place]  Patient name: Christopher Pena MRN: [id number] DOB: 04/04/1946 Sex: male  REASON FOR VISIT: follow up  HPI: Christopher Pena is a 69 y.o. male who is status post left femoral to anterior tibial bypass graft on 12/10/2015.  This was done for a wound on his left great toe.  He has a history of a right femoral to below-knee popliteal bypass graft with vein which has occluded.  He was last seen on 04/15/2016 and his bypass graft was patent.  He states that several days after this he began having worsening claudication symptoms and cannot feel a pulse within his graft.  Past Medical History:  Diagnosis Date  . Anxiety   . Chronic low back pain    "degenerative spine dx'd ~ 7 yr ago" (06/08/2013)  . Claudication (HCC)   . COPD (chronic obstructive pulmonary disease) (HCC)   . Emphysema of lung (HCC)   . Hypertension    "moderately high; RX didn't help" (06/08/2013)  . Neuromuscular disorder (HCC)    degen spine  . Non-healing wound of lower extremity 06/09/2013  . PAD (peripheral artery disease), PTA/stent IDEV & chocolate baloon 06/08/13 04/13/2013   Severely reduced ABI's of 0.3 bilaterally   . Shortness of breath    w/ exertion  . Tobacco abuse   . Tobacco use 06/09/2013  . Unexplained weight loss     Family History  Problem Relation Age of Onset  . Hyperlipidemia Mother   . Heart disease Mother   . Diabetes Mother   . Cancer Father     Liver    SOCIAL HISTORY: Social History  Substance Use Topics  . Smoking status: Light Tobacco Smoker    Packs/day: 0.25    Years: 50.00    Types: Cigarettes    Start date: 07/03/1963  . Smokeless tobacco: Never Used     Comment: states "he is ready to quit"  . Alcohol use No     Comment: 06/08/2013 "quit 02/2007; drank my share before that though"    Allergies  Allergen Reactions  . Clindamycin/Lincomycin Diarrhea    Current Outpatient Prescriptions    Medication Sig Dispense Refill  . albuterol (PROAIR HFA) 108 (90 Base) MCG/ACT inhaler Inhale 2 puffs into the lungs every 4 (four) hours as needed for wheezing or shortness of breath. 1 Inhaler 6  . aspirin EC 81 MG tablet Take 81 mg by mouth daily.    . cetirizine (ZYRTEC) 10 MG tablet Take 1 tablet (10 mg total) by mouth daily. 30 tablet 11  . fluticasone (FLONASE) 50 MCG/ACT nasal spray Place 2 sprays into both nostrils daily. 16 g 6  . fluticasone furoate-vilanterol (BREO ELLIPTA) 100-25 MCG/INH AEPB Inhale 1 puff into the lungs daily. 60 each 0  . gabapentin (NEURONTIN) 300 MG capsule Take 1 capsule (300 mg total) by mouth 3 (three) times daily. 90 capsule 3  . oxyCODONE (ROXICODONE) 15 MG immediate release tablet Take 15 mg by mouth every 4 (four) hours as needed for pain.     No current facility-administered medications for this visit.     REVIEW OF SYSTEMS:  [X] denotes positive finding, [ ] denotes negative finding Cardiac  Comments:  Chest pain or chest pressure:    Shortness of   breath upon exertion:    Short of breath when lying flat:    Irregular heart rhythm:        Vascular    Pain in calf, thigh, or hip brought on by ambulation: x   Pain in feet at night that wakes you up from your sleep:     Blood clot in your veins:    Leg swelling:         Pulmonary    Oxygen at home:    Productive cough:     Wheezing:         Neurologic    Sudden weakness in arms or legs:     Sudden numbness in arms or legs:     Sudden onset of difficulty speaking or slurred speech:    Temporary loss of vision in one eye:     Problems with dizziness:         Gastrointestinal    Blood in stool:     Vomited blood:         Genitourinary    Burning when urinating:     Blood in urine:        Psychiatric    Major depression:         Hematologic    Bleeding problems:    Problems with blood clotting too easily:        Skin    Rashes or ulcers: x       Constitutional    Fever or  chills:      PHYSICAL EXAM: Vitals:   05/20/16 0857  BP: 120/76  Pulse: 77  Resp: (!) 22  Temp: 98.6 F (37 C)  TempSrc: Oral  SpO2: 97%  Weight: 118 lb 9.6 oz (53.8 kg)  Height: 5' 6" (1.676 m)    GENERAL: The patient is a well-nourished male, in no acute distress. The vital signs are documented above. CARDIAC: There is a regular rate and rhythm.  VASCULAR: Nonpalpable pulse within superficial left anterior tibial bypass graft PULMONARY: There is good air exchange bilaterally without wheezing or rales. ABDOMEN: Soft and non-tender with normal pitched bowel sounds.  MUSCULOSKELETAL: There are no major deformities or cyanosis. NEUROLOGIC: No focal weakness or paresthesias are detected. SKIN: Left great toe ulcer PSYCHIATRIC: The patient has a normal affect.  DATA:  None  MEDICAL ISSUES: The patient's bypass graft is occluded.  This probably happened about 3 weeks ago.  Because he has a toe wound revealed a coin to the operating room to attempt to salvage his bypass graft is the best chance at limb salvage.  I have scheduled this for tomorrow.  If I cannot get his bypass graft open, I will need to consider redoing this with Gore-Tex.  I would also like to start him on anticoagulation after his operation.  Unfortunately, he continues to smoke    [place] Christopher Dames, MD [facility] [place] Tel [phone] Pager [phone] 

## 2016-05-20 NOTE — Progress Notes (Signed)
PCP- Dr. Neena RhymesKatherine Tabori Pulmonologist - Dr. Sherene SiresWert Cardiologist - denies  EKG - 12/06/15 CXR - 01/02/16 Echo- denies Stress test - 2014 Cardiac Cath - denies  Patient denies recent chest pain but states that he has had shortness of breath since April 2017.  Patient informs nurse that he has seen his PCP for this issue as well as Dr. Sherene SiresWert.

## 2016-05-21 ENCOUNTER — Encounter (HOSPITAL_COMMUNITY): Payer: Self-pay | Admitting: Urology

## 2016-05-21 ENCOUNTER — Inpatient Hospital Stay (HOSPITAL_COMMUNITY): Payer: Medicare Other | Admitting: Anesthesiology

## 2016-05-21 ENCOUNTER — Encounter (HOSPITAL_COMMUNITY)
Admission: RE | Disposition: A | Discharge status: Home / Self Care | Payer: Self-pay | Source: Ambulatory Visit | Attending: Surgery

## 2016-05-21 ENCOUNTER — Inpatient Hospital Stay (HOSPITAL_COMMUNITY)
Admission: RE | Admit: 2016-05-21 | Discharge: 2016-05-23 | DRG: 272 | Disposition: A | Discharge status: Home / Self Care | Payer: Medicare Other | Source: Ambulatory Visit | Attending: Surgery | Admitting: Surgery

## 2016-05-21 DIAGNOSIS — F17210 Nicotine dependence, cigarettes, uncomplicated: Secondary | ICD-10-CM | POA: Diagnosis not present

## 2016-05-21 DIAGNOSIS — G3189 Other specified degenerative diseases of nervous system: Secondary | ICD-10-CM | POA: Diagnosis present

## 2016-05-21 DIAGNOSIS — I739 Peripheral vascular disease, unspecified: Secondary | ICD-10-CM | POA: Diagnosis not present

## 2016-05-21 DIAGNOSIS — Z881 Allergy status to other antibiotic agents status: Secondary | ICD-10-CM | POA: Diagnosis not present

## 2016-05-21 DIAGNOSIS — L97529 Non-pressure chronic ulcer of other part of left foot with unspecified severity: Secondary | ICD-10-CM | POA: Diagnosis not present

## 2016-05-21 DIAGNOSIS — Z800 Family history of malignant neoplasm of digestive organs: Secondary | ICD-10-CM | POA: Diagnosis not present

## 2016-05-21 DIAGNOSIS — Z7982 Long term (current) use of aspirin: Secondary | ICD-10-CM

## 2016-05-21 DIAGNOSIS — I10 Essential (primary) hypertension: Secondary | ICD-10-CM | POA: Diagnosis not present

## 2016-05-21 DIAGNOSIS — Z7951 Long term (current) use of inhaled steroids: Secondary | ICD-10-CM | POA: Diagnosis not present

## 2016-05-21 DIAGNOSIS — Z8249 Family history of ischemic heart disease and other diseases of the circulatory system: Secondary | ICD-10-CM

## 2016-05-21 DIAGNOSIS — Z833 Family history of diabetes mellitus: Secondary | ICD-10-CM

## 2016-05-21 DIAGNOSIS — M545 Low back pain: Secondary | ICD-10-CM | POA: Diagnosis present

## 2016-05-21 DIAGNOSIS — Z79899 Other long term (current) drug therapy: Secondary | ICD-10-CM | POA: Diagnosis not present

## 2016-05-21 DIAGNOSIS — T82858A Stenosis of vascular prosthetic devices, implants and grafts, initial encounter: Secondary | ICD-10-CM | POA: Diagnosis not present

## 2016-05-21 DIAGNOSIS — Z79891 Long term (current) use of opiate analgesic: Secondary | ICD-10-CM | POA: Diagnosis not present

## 2016-05-21 DIAGNOSIS — T82898A Other specified complication of vascular prosthetic devices, implants and grafts, initial encounter: Secondary | ICD-10-CM | POA: Diagnosis present

## 2016-05-21 DIAGNOSIS — Y832 Surgical operation with anastomosis, bypass or graft as the cause of abnormal reaction of the patient, or of later complication, without mention of misadventure at the time of the procedure: Secondary | ICD-10-CM | POA: Diagnosis present

## 2016-05-21 DIAGNOSIS — J449 Chronic obstructive pulmonary disease, unspecified: Secondary | ICD-10-CM | POA: Diagnosis present

## 2016-05-21 HISTORY — PX: ANGIOPLASTY: SHX39

## 2016-05-21 HISTORY — PX: THROMBECTOMY FEMORAL ARTERY: SHX6406

## 2016-05-21 LAB — APTT: APTT: 33 s (ref 24–36)

## 2016-05-21 LAB — COMPREHENSIVE METABOLIC PANEL
ALBUMIN: 3.6 g/dL (ref 3.5–5.0)
ALK PHOS: 57 U/L (ref 38–126)
ALT: 10 U/L — ABNORMAL LOW (ref 17–63)
AST: 23 U/L (ref 15–41)
Anion gap: 6 (ref 5–15)
BILIRUBIN TOTAL: 0.5 mg/dL (ref 0.3–1.2)
BUN: 15 mg/dL (ref 6–20)
CALCIUM: 8.8 mg/dL — AB (ref 8.9–10.3)
CO2: 31 mmol/L (ref 22–32)
CREATININE: 0.78 mg/dL (ref 0.61–1.24)
Chloride: 102 mmol/L (ref 101–111)
GFR calc Af Amer: >60 mL/min (ref >60)
GFR calc non Af Amer: >60 mL/min (ref >60)
Glucose, Bld: 128 mg/dL — ABNORMAL HIGH (ref 65–99)
Potassium: 3.5 mmol/L (ref 3.5–5.1)
SODIUM: 139 mmol/L (ref 135–145)
TOTAL PROTEIN: 7.2 g/dL (ref 6.5–8.1)

## 2016-05-21 LAB — SURGICAL PCR SCREEN
MRSA, PCR: NEGATIVE
STAPHYLOCOCCUS AUREUS: POSITIVE — AB

## 2016-05-21 LAB — PROTIME-INR
INR: 1.02
Prothrombin Time: 13.4 seconds (ref 11.4–15.2)

## 2016-05-21 LAB — CBC
HEMATOCRIT: 39.1 % (ref 39.0–52.0)
HEMOGLOBIN: 12.7 g/dL — AB (ref 13.0–17.0)
MCH: 32.2 pg (ref 26.0–34.0)
MCHC: 32.5 g/dL (ref 30.0–36.0)
MCV: 99 fL (ref 78.0–100.0)
Platelets: 232 10*3/uL (ref 150–400)
RBC: 3.95 MIL/uL — AB (ref 4.22–5.81)
RDW: 14.2 % (ref 11.5–15.5)
WBC: 9.2 10*3/uL (ref 4.0–10.5)

## 2016-05-21 LAB — TYPE AND SCREEN
ABO/RH(D): A NEG
ANTIBODY SCREEN: NEGATIVE

## 2016-05-21 SURGERY — THROMBECTOMY, ARTERY, FEMORAL
Anesthesia: General | Site: Leg Lower | Laterality: Left

## 2016-05-21 MED ORDER — PANTOPRAZOLE SODIUM 40 MG PO TBEC
40.0000 mg | DELAYED_RELEASE_TABLET | Freq: Every day | ORAL | Status: DC
  Administered 2016-05-21 – 2016-05-23 (×3): 40 mg via ORAL
  Filled 2016-05-21 (×3): qty 1

## 2016-05-21 MED ORDER — CHLORHEXIDINE GLUCONATE CLOTH 2 % EX PADS: 6.0000 | MEDICATED_PAD | Freq: Once | CUTANEOUS | Status: DC

## 2016-05-21 MED ORDER — FLUTICASONE FUROATE-VILANTEROL 100-25 MCG/INH IN AEPB
1.0000 | INHALATION_SPRAY | Freq: Every day | RESPIRATORY_TRACT | Status: DC
  Administered 2016-05-22 – 2016-05-23 (×2): 1 via RESPIRATORY_TRACT
  Filled 2016-05-21 (×3): qty 28

## 2016-05-21 MED ORDER — PROPOFOL 10 MG/ML IV BOLUS
INTRAVENOUS | Status: DC | PRN
  Administered 2016-05-21: 100 mg via INTRAVENOUS

## 2016-05-21 MED ORDER — LIDOCAINE HCL (CARDIAC) 20 MG/ML IV SOLN
INTRAVENOUS | Status: DC | PRN
  Administered 2016-05-21: 50 mg via INTRAVENOUS

## 2016-05-21 MED ORDER — PAPAVERINE HCL 30 MG/ML IJ SOLN
INTRAMUSCULAR | Status: AC
  Filled 2016-05-21: qty 2

## 2016-05-21 MED ORDER — GABAPENTIN 300 MG PO CAPS: 300.0000 mg | ORAL_CAPSULE | Freq: Three times a day (TID) | ORAL | Status: DC | PRN

## 2016-05-21 MED ORDER — SUGAMMADEX SODIUM 200 MG/2ML IV SOLN
INTRAVENOUS | Status: DC | PRN
  Administered 2016-05-21: 200 mg via INTRAVENOUS

## 2016-05-21 MED ORDER — ROCURONIUM BROMIDE 100 MG/10ML IV SOLN
INTRAVENOUS | Status: DC | PRN
  Administered 2016-05-21: 30 mg via INTRAVENOUS

## 2016-05-21 MED ORDER — NICOTINE 14 MG/24HR TD PT24
14.0000 mg | MEDICATED_PATCH | TRANSDERMAL | Status: DC
  Administered 2016-05-21: 14 mg via TRANSDERMAL
  Filled 2016-05-21: qty 1

## 2016-05-21 MED ORDER — SODIUM CHLORIDE 0.9 % IV SOLN
INTRAVENOUS | Status: DC | PRN
  Administered 2016-05-21: 500 mL

## 2016-05-21 MED ORDER — ONDANSETRON HCL 4 MG/2ML IJ SOLN: 4.0000 mg | Freq: Four times a day (QID) | INTRAMUSCULAR | Status: DC | PRN

## 2016-05-21 MED ORDER — POTASSIUM CHLORIDE CRYS ER 20 MEQ PO TBCR: 20.0000 meq | EXTENDED_RELEASE_TABLET | Freq: Every day | ORAL | Status: DC | PRN

## 2016-05-21 MED ORDER — MORPHINE SULFATE (PF) 2 MG/ML IV SOLN
2.0000 mg | INTRAVENOUS | Status: DC | PRN
  Administered 2016-05-22 (×6): 2 mg via INTRAVENOUS
  Administered 2016-05-22: 3 mg via INTRAVENOUS
  Administered 2016-05-22: 2 mg via INTRAVENOUS
  Administered 2016-05-23: 3 mg via INTRAVENOUS
  Filled 2016-05-21 (×2): qty 2
  Filled 2016-05-21 (×6): qty 1
  Filled 2016-05-21: qty 2

## 2016-05-21 MED ORDER — FLUTICASONE PROPIONATE 50 MCG/ACT NA SUSP
2.0000 | Freq: Every day | NASAL | Status: DC | PRN
  Filled 2016-05-21: qty 16

## 2016-05-21 MED ORDER — FENTANYL CITRATE (PF) 100 MCG/2ML IJ SOLN
25.0000 ug | INTRAMUSCULAR | Status: DC | PRN
  Administered 2016-05-21: 25 ug via INTRAVENOUS

## 2016-05-21 MED ORDER — FENTANYL CITRATE (PF) 250 MCG/5ML IJ SOLN
INTRAMUSCULAR | Status: AC
  Filled 2016-05-21: qty 5

## 2016-05-21 MED ORDER — OXYCODONE HCL 5 MG PO TABS
15.0000 mg | ORAL_TABLET | ORAL | Status: DC | PRN
  Administered 2016-05-21 – 2016-05-23 (×8): 15 mg via ORAL
  Filled 2016-05-21 (×8): qty 3

## 2016-05-21 MED ORDER — LACTATED RINGERS IV SOLN
INTRAVENOUS | Status: DC
  Administered 2016-05-21: 09:00:00 via INTRAVENOUS

## 2016-05-21 MED ORDER — PHENYLEPHRINE HCL 10 MG/ML IJ SOLN
INTRAVENOUS | Status: DC | PRN
  Administered 2016-05-21: 25 ug/min via INTRAVENOUS

## 2016-05-21 MED ORDER — MAGNESIUM HYDROXIDE 400 MG/5ML PO SUSP: 30.0000 mL | Freq: Every day | ORAL | Status: DC | PRN

## 2016-05-21 MED ORDER — SODIUM CHLORIDE 0.9 % IV SOLN: INTRAVENOUS | Status: DC

## 2016-05-21 MED ORDER — HEPARIN (PORCINE) IN NACL 100-0.45 UNIT/ML-% IJ SOLN
800.0000 [IU]/h | INTRAMUSCULAR | Status: DC
  Filled 2016-05-21: qty 250

## 2016-05-21 MED ORDER — DOCUSATE SODIUM 100 MG PO CAPS
100.0000 mg | ORAL_CAPSULE | Freq: Every day | ORAL | Status: DC
  Administered 2016-05-22 – 2016-05-23 (×2): 100 mg via ORAL
  Filled 2016-05-21 (×2): qty 1

## 2016-05-21 MED ORDER — HEPARIN SODIUM (PORCINE) 1000 UNIT/ML IJ SOLN
INTRAMUSCULAR | Status: DC | PRN
  Administered 2016-05-21: 5000 [IU] via INTRAVENOUS

## 2016-05-21 MED ORDER — ASPIRIN EC 81 MG PO TBEC
81.0000 mg | DELAYED_RELEASE_TABLET | Freq: Every day | ORAL | Status: DC
  Administered 2016-05-22 – 2016-05-23 (×2): 81 mg via ORAL
  Filled 2016-05-21 (×2): qty 1

## 2016-05-21 MED ORDER — FENTANYL CITRATE (PF) 100 MCG/2ML IJ SOLN
INTRAMUSCULAR | Status: AC
  Filled 2016-05-21: qty 2

## 2016-05-21 MED ORDER — ACETAMINOPHEN 325 MG RE SUPP: 325.0000 mg | RECTAL | Status: DC | PRN

## 2016-05-21 MED ORDER — MIDAZOLAM HCL 5 MG/5ML IJ SOLN
INTRAMUSCULAR | Status: DC | PRN
  Administered 2016-05-21: 2 mg via INTRAVENOUS

## 2016-05-21 MED ORDER — DEXTROSE 5 % IV SOLN
INTRAVENOUS | Status: AC
  Filled 2016-05-21: qty 1.5

## 2016-05-21 MED ORDER — ALUM & MAG HYDROXIDE-SIMETH 200-200-20 MG/5ML PO SUSP: 15.0000 mL | ORAL | Status: DC | PRN

## 2016-05-21 MED ORDER — PHENOL 1.4 % MT LIQD: 1.0000 | OROMUCOSAL | Status: DC | PRN

## 2016-05-21 MED ORDER — BISACODYL 10 MG RE SUPP: 10.0000 mg | Freq: Every day | RECTAL | Status: DC | PRN

## 2016-05-21 MED ORDER — GUAIFENESIN-DM 100-10 MG/5ML PO SYRP: 15.0000 mL | ORAL_SOLUTION | ORAL | Status: DC | PRN

## 2016-05-21 MED ORDER — MAGNESIUM SULFATE 2 GM/50ML IV SOLN
2.0000 g | Freq: Every day | INTRAVENOUS | Status: DC | PRN
Start: 2016-05-21 — End: 2016-05-23

## 2016-05-21 MED ORDER — ALBUTEROL SULFATE (2.5 MG/3ML) 0.083% IN NEBU: 3.0000 mL | INHALATION_SOLUTION | RESPIRATORY_TRACT | Status: DC | PRN

## 2016-05-21 MED ORDER — DEXTROSE 5 % IV SOLN
1.5000 g | INTRAVENOUS | Status: AC
  Administered 2016-05-21: 1.5 g via INTRAVENOUS

## 2016-05-21 MED ORDER — LORATADINE 10 MG PO TABS
10.0000 mg | ORAL_TABLET | Freq: Every day | ORAL | Status: DC
  Administered 2016-05-22 – 2016-05-23 (×2): 10 mg via ORAL
  Filled 2016-05-21 (×2): qty 1

## 2016-05-21 MED ORDER — DEXTROSE 5 % IV SOLN
1.5000 g | Freq: Two times a day (BID) | INTRAVENOUS | Status: AC
  Administered 2016-05-21 – 2016-05-22 (×2): 1.5 g via INTRAVENOUS
  Filled 2016-05-21 (×2): qty 1.5

## 2016-05-21 MED ORDER — HYDRALAZINE HCL 20 MG/ML IJ SOLN
5.0000 mg | INTRAMUSCULAR | Status: DC | PRN
Start: 2016-05-21 — End: 2016-05-23

## 2016-05-21 MED ORDER — ONDANSETRON HCL 4 MG/2ML IJ SOLN
INTRAMUSCULAR | Status: DC | PRN
  Administered 2016-05-21: 4 mg via INTRAVENOUS

## 2016-05-21 MED ORDER — OXYCODONE HCL 5 MG PO TABS
ORAL_TABLET | ORAL | Status: AC
  Filled 2016-05-21: qty 3

## 2016-05-21 MED ORDER — LABETALOL HCL 5 MG/ML IV SOLN: 10.0000 mg | INTRAVENOUS | Status: DC | PRN

## 2016-05-21 MED ORDER — IOPAMIDOL (ISOVUE-300) INJECTION 61%
INTRAVENOUS | Status: AC
  Filled 2016-05-21: qty 50

## 2016-05-21 MED ORDER — MUPIROCIN 2 % EX OINT
1.0000 "application " | TOPICAL_OINTMENT | Freq: Once | CUTANEOUS | Status: AC
  Administered 2016-05-21: 1 via TOPICAL

## 2016-05-21 MED ORDER — HEPARIN (PORCINE) IN NACL 100-0.45 UNIT/ML-% IJ SOLN
800.0000 [IU]/h | INTRAMUSCULAR | Status: DC
  Administered 2016-05-21: 800 [IU]/h via INTRAVENOUS
  Filled 2016-05-21: qty 250

## 2016-05-21 MED ORDER — PROMETHAZINE HCL 25 MG/ML IJ SOLN: 6.2500 mg | INTRAMUSCULAR | Status: DC | PRN

## 2016-05-21 MED ORDER — SODIUM CHLORIDE 0.9 % IV SOLN: 500.0000 mL | Freq: Once | INTRAVENOUS | Status: DC | PRN

## 2016-05-21 MED ORDER — FENTANYL CITRATE (PF) 100 MCG/2ML IJ SOLN
INTRAMUSCULAR | Status: DC | PRN
  Administered 2016-05-21: 100 ug via INTRAVENOUS

## 2016-05-21 MED ORDER — ACETAMINOPHEN 325 MG PO TABS
325.0000 mg | ORAL_TABLET | ORAL | Status: DC | PRN
Start: 2016-05-21 — End: 2016-05-22

## 2016-05-21 MED ORDER — 0.9 % SODIUM CHLORIDE (POUR BTL) OPTIME
TOPICAL | Status: DC | PRN
  Administered 2016-05-21: 2000 mL

## 2016-05-21 MED ORDER — HEPARIN SODIUM (PORCINE) 1000 UNIT/ML IJ SOLN
INTRAMUSCULAR | Status: AC
  Filled 2016-05-21: qty 1

## 2016-05-21 MED ORDER — SODIUM CHLORIDE 0.9 % IV SOLN
INTRAVENOUS | Status: DC
  Administered 2016-05-21: 75 mL/h via INTRAVENOUS
  Administered 2016-05-22: 10:00:00 via INTRAVENOUS

## 2016-05-21 MED ORDER — MUPIROCIN 2 % EX OINT
TOPICAL_OINTMENT | CUTANEOUS | Status: AC
  Administered 2016-05-21: 1 via TOPICAL
  Filled 2016-05-21: qty 22

## 2016-05-21 MED ORDER — METOPROLOL TARTRATE 5 MG/5ML IV SOLN: 2.0000 mg | INTRAVENOUS | Status: DC | PRN

## 2016-05-21 MED ORDER — MIDAZOLAM HCL 2 MG/2ML IJ SOLN
INTRAMUSCULAR | Status: AC
  Filled 2016-05-21: qty 2

## 2016-05-21 SURGICAL SUPPLY — 61 items
BALLN MUSTANG 4X120X135 (BALLOONS) ×3 IMPLANT
BANDAGE ACE 4X5 VEL STRL LF (GAUZE/BANDAGES/DRESSINGS) IMPLANT
BANDAGE ESMARK 6X9 LF (GAUZE/BANDAGES/DRESSINGS) IMPLANT
BNDG CMPR 9X6 STRL LF SNTH (GAUZE/BANDAGES/DRESSINGS)
BNDG ESMARK 6X9 LF (GAUZE/BANDAGES/DRESSINGS)
CANISTER SUCTION 2500CC (MISCELLANEOUS) ×3 IMPLANT
CATH EMB 3FR 80CM (CATHETERS) ×3 IMPLANT
CATH EMB 4FR 80CM (CATHETERS) ×6 IMPLANT
CATH EMB 5FR 80CM (CATHETERS) ×3 IMPLANT
CLIP TI MEDIUM 24 (CLIP) ×3 IMPLANT
CLIP TI WIDE RED SMALL 24 (CLIP) ×3 IMPLANT
CUFF TOURNIQUET SINGLE 24IN (TOURNIQUET CUFF) IMPLANT
CUFF TOURNIQUET SINGLE 34IN LL (TOURNIQUET CUFF) IMPLANT
DERMABOND ADVANCED (GAUZE/BANDAGES/DRESSINGS) ×2
DERMABOND ADVANCED .7 DNX12 (GAUZE/BANDAGES/DRESSINGS) ×1 IMPLANT
DRAIN CHANNEL 15F RND FF W/TCR (WOUND CARE) IMPLANT
DRAPE X-RAY CASS 24X20 (DRAPES) IMPLANT
DRSG COVADERM 4X10 (GAUZE/BANDAGES/DRESSINGS) IMPLANT
DRSG COVADERM 4X8 (GAUZE/BANDAGES/DRESSINGS) IMPLANT
ELECT REM PT RETURN 9FT ADLT (ELECTROSURGICAL) ×3
ELECTRODE REM PT RTRN 9FT ADLT (ELECTROSURGICAL) ×1 IMPLANT
EVACUATOR SILICONE 100CC (DRAIN) IMPLANT
GLOVE BIO SURGEON STRL SZ 6.5 (GLOVE) ×4 IMPLANT
GLOVE BIO SURGEONS STRL SZ 6.5 (GLOVE) ×2
GLOVE BIOGEL PI IND STRL 7.5 (GLOVE) ×1 IMPLANT
GLOVE BIOGEL PI INDICATOR 7.5 (GLOVE) ×2
GLOVE SURG SS PI 7.5 STRL IVOR (GLOVE) ×3 IMPLANT
GOWN STRL REUS W/ TWL LRG LVL3 (GOWN DISPOSABLE) ×3 IMPLANT
GOWN STRL REUS W/ TWL XL LVL3 (GOWN DISPOSABLE) ×1 IMPLANT
GOWN STRL REUS W/TWL LRG LVL3 (GOWN DISPOSABLE) ×9
GOWN STRL REUS W/TWL XL LVL3 (GOWN DISPOSABLE) ×3
GUIDEWIRE BENTSON TYPE (WIRE) ×3 IMPLANT
HEMOSTAT SNOW SURGICEL 2X4 (HEMOSTASIS) IMPLANT
KIT BASIN OR (CUSTOM PROCEDURE TRAY) ×3 IMPLANT
KIT ENCORE 26 ADVANTAGE (KITS) ×3 IMPLANT
KIT ROOM TURNOVER OR (KITS) ×3 IMPLANT
LIQUID BAND (GAUZE/BANDAGES/DRESSINGS) ×3 IMPLANT
NS IRRIG 1000ML POUR BTL (IV SOLUTION) ×6 IMPLANT
PACK PERIPHERAL VASCULAR (CUSTOM PROCEDURE TRAY) ×3 IMPLANT
PAD ARMBOARD 7.5X6 YLW CONV (MISCELLANEOUS) ×6 IMPLANT
PADDING CAST COTTON 6X4 STRL (CAST SUPPLIES) IMPLANT
SET COLLECT BLD 21X3/4 12 (NEEDLE) IMPLANT
STOPCOCK 4 WAY LG BORE MALE ST (IV SETS) IMPLANT
SUT ETHILON 3 0 PS 1 (SUTURE) IMPLANT
SUT PROLENE 5 0 C 1 24 (SUTURE) IMPLANT
SUT PROLENE 6 0 BV (SUTURE) ×6 IMPLANT
SUT PROLENE 7 0 BV 1 (SUTURE) IMPLANT
SUT SILK 2 0 SH (SUTURE) ×3 IMPLANT
SUT SILK 3 0 (SUTURE)
SUT SILK 3-0 18XBRD TIE 12 (SUTURE) IMPLANT
SUT VIC AB 2-0 CT1 27 (SUTURE) ×3
SUT VIC AB 2-0 CT1 TAPERPNT 27 (SUTURE) ×1 IMPLANT
SUT VIC AB 3-0 SH 27 (SUTURE) ×6
SUT VIC AB 3-0 SH 27X BRD (SUTURE) ×2 IMPLANT
SUT VICRYL 4-0 PS2 18IN ABS (SUTURE) ×3 IMPLANT
SYR 3ML LL SCALE MARK (SYRINGE) ×3 IMPLANT
TRAY FOLEY W/METER SILVER 16FR (SET/KITS/TRAYS/PACK) ×3 IMPLANT
TUBING EXTENTION W/L.L. (IV SETS) IMPLANT
UNDERPAD 30X30 (UNDERPADS AND DIAPERS) ×3 IMPLANT
WATER STERILE IRR 1000ML POUR (IV SOLUTION) IMPLANT
WIRE HI TORQ VERSACORE J 260CM (WIRE) ×3 IMPLANT

## 2016-05-21 NOTE — Interval H&P Note (Signed)
History and Physical Interval Note:  05/21/2016 12:04 PM  Christopher Pena  has presented today for surgery, with the diagnosis of Occluded left femoral to anterior tibial bypass graft T82.858A  The various methods of treatment have been discussed with the patient and family. After consideration of risks, benefits and other options for treatment, the patient has consented to  Procedure(s): THROMBECTOMY FEMORAL TO ANTERIOR TIBIAL BYPASS GRAFT (Left) as a surgical intervention .  The patient's history has been reviewed, patient examined, no change in status, stable for surgery.  I have reviewed the patient's chart and labs.  Questions were answered to the patient's satisfaction.     [name], [place]

## 2016-05-21 NOTE — Progress Notes (Signed)
ANTICOAGULATION CONSULT NOTE - Initial Consult  Pharmacy Consult for Heparin IV drip Indication: s/p THROMBECTOMY FEMORAL TO ANTERIOR TIBIAL BYPASS GRAFT. OK for pharmacy to follow and adjust down, but do not increase rate even when level is low.   Allergies  Allergen Reactions  . Clindamycin/Lincomycin Diarrhea    Patient Measurements: Height: 5\' 6"  (167.6 cm) Weight: 118 lb 9.6 oz (53.8 kg) IBW/kg (Calculated) : 63.8 Heparin Dosing Weight: = TBW = 53.8 kg  Vital Signs: Temp: 97.5 F (36.4 C) (10/03 1400) Temp Source: Oral (10/03 0828) BP: 110/64 (10/03 1415) Pulse Rate: 69 (10/03 1415)  Labs:  Recent Labs  05/21/16 0839  HGB 12.7*  HCT 39.1  PLT 232  APTT 33  LABPROT 13.4  INR 1.02  CREATININE 0.78    Estimated Creatinine Clearance: 66.3 mL/min (by C-G formula based on SCr of 0.78 mg/dL).   Medical History: Past Medical History:  Diagnosis Date  . Anxiety   . Chronic low back pain    "degenerative spine dx'd ~ 7 yr ago" (06/08/2013)  . Claudication (HCC)   . COPD (chronic obstructive pulmonary disease) (HCC)   . Emphysema of lung (HCC)   . Hypertension    "moderately high; RX didn't help" (06/08/2013)  . Neuromuscular disorder (HCC)    degen spine  . Non-healing wound of lower extremity 06/09/2013  . PAD (peripheral artery disease), PTA/stent IDEV & chocolate baloon 06/08/13 04/13/2013   Severely reduced ABI's of 0.3 bilaterally   . Shortness of breath    w/ exertion  . Tobacco abuse   . Tobacco use 06/09/2013  . Unexplained weight loss     Medications:  Prescriptions Prior to Admission  Medication Sig Dispense Refill Last Dose  . albuterol (PROAIR HFA) 108 (90 Base) MCG/ACT inhaler Inhale 2 puffs into the lungs every 4 (four) hours as needed for wheezing or shortness of breath. 1 Inhaler 6 05/21/2016 at Unknown time  . aspirin EC 81 MG tablet Take 81 mg by mouth daily.   05/21/2016 at 0700  . cetirizine (ZYRTEC) 10 MG tablet Take 1 tablet (10 mg  total) by mouth daily. 30 tablet 11 05/21/2016 at 0700  . fluticasone furoate-vilanterol (BREO ELLIPTA) 100-25 MCG/INH AEPB Inhale 1 puff into the lungs daily. 60 each 0 05/21/2016 at Unknown time  . gabapentin (NEURONTIN) 300 MG capsule Take 1 capsule (300 mg total) by mouth 3 (three) times daily. (Patient taking differently: Take 300 mg by mouth 3 (three) times daily as needed (pain). ) 90 capsule 3 05/20/2016 at Unknown time  . oxyCODONE (ROXICODONE) 15 MG immediate release tablet Take 15 mg by mouth every 4 (four) hours as needed for pain.   05/21/2016 at 0700  . fluticasone (FLONASE) 50 MCG/ACT nasal spray Place 2 sprays into both nostrils daily. (Patient taking differently: Place 2 sprays into both nostrils daily as needed for allergies. ) 16 g 6 Unknown at Unknown time   Scheduled:  . Chlorhexidine Gluconate Cloth  6 each Topical Once   And  . Chlorhexidine Gluconate Cloth  6 each Topical Once  . fentaNYL         Assessment: 70 y.o male who is s/p thomobectomy and balloon angioplasty of left femoral to popliteal bypass grafting.  Patient has been placed on IV heparin drip at 800 units/hr per vascular surgeon, Dr. Myra GianottiBrabham. Pharmacy consulted to follow and adjust rate down, but do not increase rate even when heparin level is low.  Vascular surgery notes that patient is to go  for arteriogram tomorrow and possible intervention, and to continue heparin. He was not on anticoagulation prior to admission.    Goal of Therapy:  May adjust rate down if level is > 0.7 Monitor platelets by anticoagulation protocol: Yes   Plan:  Continue IV heparin drip at 800 units/hr Heparin level 8 hours post start of heparin drip.  Daily heparin level, CBC.  Pharmacist may adjust rate down if level is high (> 0.7 units/ml).    Noah Delaine, RPh Clinical Pharmacist Pager: [phone] 05/21/2016,4:19 PM

## 2016-05-21 NOTE — Transfer of Care (Signed)
Immediate Anesthesia Transfer of Care Note  Patient: Christopher BalesDavid H Stacy  Procedure(s) Performed: Procedure(s): THROMBECTOMY FEMORAL TO ANTERIOR TIBIAL BYPASS GRAFT (Left) ballon ANGIOPLASTY of femoral to anterior to tibial bypass graft (Left)  Patient Location: PACU  Anesthesia Type:General  Level of Consciousness: awake, alert  and oriented  Airway & Oxygen Therapy: Patient Spontanous Breathing and Patient connected to nasal cannula oxygen  Post-op Assessment: Report given to RN and Post -op Vital signs reviewed and stable  Post vital signs: Reviewed and stable  Last Vitals:  Vitals:   05/21/16 0828  BP: 120/73  Pulse: 87  Resp: 20  Temp: 36.5 C    Last Pain:  Vitals:   05/21/16 0828  TempSrc: Oral         Complications: No apparent anesthesia complications

## 2016-05-21 NOTE — Interval H&P Note (Signed)
History and Physical Interval Note:  05/21/2016 8:42 AM  Christopher Pena  has presented today for surgery, with the diagnosis of Occluded left femoral to anterior tibial bypass graft T82.858A  The various methods of treatment have been discussed with the patient and family. After consideration of risks, benefits and other options for treatment, the patient has consented to  Procedure(s): THROMBECTOMY FEMORAL TO ANTERIOR TIBIAL BYPASS GRAFT (Left) as a surgical intervention .  The patient's history has been reviewed, patient examined, no change in status, stable for surgery.  I have reviewed the patient's chart and labs.  Questions were answered to the patient's satisfaction.     [name], [place]

## 2016-05-21 NOTE — Anesthesia Postprocedure Evaluation (Signed)
Anesthesia Post Note  Patient: Christopher Pena  Procedure(s) Performed: Procedure(s) (LRB): THROMBECTOMY FEMORAL TO ANTERIOR TIBIAL BYPASS GRAFT (Left) ballon ANGIOPLASTY of femoral to anterior to tibial bypass graft (Left)  Patient location during evaluation: PACU Anesthesia Type: General Level of consciousness: awake and alert Pain management: pain level controlled Vital Signs Assessment: post-procedure vital signs reviewed and stable Respiratory status: spontaneous breathing, nonlabored ventilation, respiratory function stable and patient connected to nasal cannula oxygen Cardiovascular status: blood pressure returned to baseline and stable Postop Assessment: no signs of nausea or vomiting Anesthetic complications: no    Last Vitals:  Vitals:   05/21/16 1400 05/21/16 1415  BP: 112/63 110/64  Pulse: 63 69  Resp: 16 17  Temp: 36.4 C     Last Pain:  Vitals:   05/21/16 1500  TempSrc:   PainSc: Asleep                 Kennieth RadFitzgerald, Harwood Nall E

## 2016-05-21 NOTE — Anesthesia Procedure Notes (Signed)
**Note De-Pena via Obfuscation** Procedure Name: Intubation Date/Time: 05/21/2016 12:24 PM Performed by: Marena ChancyBECKNER, Christopher Pena, Christopher Pena, Christopher Pena and Patient being monitored Patient Re-evaluated:Patient Re-evaluated prior to inductionOxygen Delivery Method: Circle System Utilized Preoxygenation: Pre-oxygenation with 100% oxygen Intubation Type: IV induction Ventilation: Mask ventilation without difficulty Laryngoscope Size: Miller and 2 Grade View: Grade I Tube type: Oral Number of attempts: 1 Airway Equipment and Method: Stylet and Oral airway Placement Confirmation: ETT inserted through vocal cords under direct vision,  positive ETCO2 and breath sounds checked- equal and bilateral Tube secured with: Tape Dental Injury: Teeth and Oropharynx as per pre-operative assessment

## 2016-05-21 NOTE — Anesthesia Preprocedure Evaluation (Signed)
Anesthesia Evaluation  Patient identified by MRN, date of birth, ID band Patient awake    Reviewed: Allergy & Precautions, NPO status , Patient's Chart, lab work & pertinent test results  History of Anesthesia Complications Negative for: history of anesthetic complications  Airway Mallampati: I  TM Distance: >3 FB Neck ROM: Full    Dental  (+) Upper Dentures, Lower Dentures   Pulmonary shortness of breath, COPD, Current Smoker,    breath sounds clear to auscultation       Cardiovascular hypertension, + Peripheral Vascular Disease   Rhythm:Regular     Neuro/Psych Anxiety  Neuromuscular disease    GI/Hepatic negative GI ROS, Neg liver ROS,   Endo/Other  negative endocrine ROS  Renal/GU negative Renal ROS     Musculoskeletal   Abdominal   Peds  Hematology negative hematology ROS (+)   Anesthesia Other Findings   Reproductive/Obstetrics                             Lab Results  Component Value Date   WBC 9.2 05/21/2016   HGB 12.7 (L) 05/21/2016   HCT 39.1 05/21/2016   MCV 99.0 05/21/2016   PLT 232 05/21/2016   Lab Results  Component Value Date   CREATININE 0.67 04/08/2016   BUN 19 04/08/2016   NA 138 04/08/2016   K 4.7 04/08/2016   CL 98 04/08/2016   CO2 29 04/08/2016    Anesthesia Physical  Anesthesia Plan  ASA: III  Anesthesia Plan: General   Post-op Pain Management:    Induction: Intravenous  Airway Management Planned: Oral ETT  Additional Equipment:   Intra-op Plan:   Post-operative Plan: Extubation in OR  Informed Consent: I have reviewed the patients History and Physical, chart, labs and discussed the procedure including the risks, benefits and alternatives for the proposed anesthesia with the patient or authorized representative who has indicated his/her understanding and acceptance.   Dental advisory given  Plan Discussed with: CRNA  Anesthesia Plan  Comments:         Anesthesia Quick Evaluation

## 2016-05-21 NOTE — Progress Notes (Addendum)
  Day of Surgery Note    Subjective:  Sleepy-just out of the OR   Vitals:   05/21/16 1400 05/21/16 1415  BP: 112/63 110/64  Pulse: 63 69  Resp: 16 17  Temp: 97.5 F (36.4 C)     Incisions:   Clean and dry without hematoma Extremities:  +doppler signal within the graft; + doppler signals left DP/PT/peroneal Cardiac:  regular Lungs:  Non labored   Assessment/Plan:  This is a 70 y.o. male who is s/p thomobectomy and balloon angioplasty of left femoral to popliteal bypass grafting  -bypass graft is patent.   -pt has been placed on heparin 800U/hr.   -pt is pre-op'd for arteriogram tomorrow and possible intervention.  Continue heparin. -to 3 south when bed available -if bypass occludes-will not be aggressive per Dr. Myra GianottiBrabham and may need a new graft in the future.   Doreatha MassedSamantha Ronrico Dupin, PA-C 05/21/2016 3:02 PM [phone]

## 2016-05-21 NOTE — H&P (View-Only) (Signed)
.                                     [facility]  Patient name: Christopher Pena MRN: [id number] DOB: 07/15/1946 Sex: male  REASON FOR VISIT: follow up  HPI: Christopher GageDavid H Leandro is a 70 y.o. male who is status post left femoral to anterior tibial bypass graft on 12/10/2015.  This was done for a wound on his left great toe.  He has a history of a right femoral to below-knee popliteal bypass graft with vein which has occluded.  He was last seen on 04/15/2016 and his bypass graft was patent.  He states that several days after this he began having worsening claudication symptoms and cannot feel a pulse within his graft.  Past Medical History:  Diagnosis Date  . Anxiety   . Chronic low back pain    "degenerative spine dx'd ~ 7 yr ago" (06/08/2013)  . Claudication (HCC)   . COPD (chronic obstructive pulmonary disease) (HCC)   . Emphysema of lung (HCC)   . Hypertension    "moderately high; RX didn't help" (06/08/2013)  . Neuromuscular disorder (HCC)    degen spine  . Non-healing wound of lower extremity 06/09/2013  . PAD (peripheral artery disease), PTA/stent IDEV & chocolate baloon 06/08/13 04/13/2013   Severely reduced ABI's of 0.3 bilaterally   . Shortness of breath    w/ exertion  . Tobacco abuse   . Tobacco use 06/09/2013  . Unexplained weight loss     Family History  Problem Relation Age of Onset  . Hyperlipidemia Mother   . Heart disease Mother   . Diabetes Mother   . Cancer Father     Liver    SOCIAL HISTORY: Social History  Substance Use Topics  . Smoking status: Light Tobacco Smoker    Packs/day: 0.25    Years: 50.00    Types: Cigarettes    Start date: 07/03/1963  . Smokeless tobacco: Never Used     Comment: states "he is ready to quit"  . Alcohol use No     Comment: 06/08/2013 "quit 02/2007; drank my share before that though"    Allergies  Allergen Reactions  . Clindamycin/Lincomycin Diarrhea    Current Outpatient Prescriptions    Medication Sig Dispense Refill  . albuterol (PROAIR HFA) 108 (90 Base) MCG/ACT inhaler Inhale 2 puffs into the lungs every 4 (four) hours as needed for wheezing or shortness of breath. 1 Inhaler 6  . aspirin EC 81 MG tablet Take 81 mg by mouth daily.    . cetirizine (ZYRTEC) 10 MG tablet Take 1 tablet (10 mg total) by mouth daily. 30 tablet 11  . fluticasone (FLONASE) 50 MCG/ACT nasal spray Place 2 sprays into both nostrils daily. 16 g 6  . fluticasone furoate-vilanterol (BREO ELLIPTA) 100-25 MCG/INH AEPB Inhale 1 puff into the lungs daily. 60 each 0  . gabapentin (NEURONTIN) 300 MG capsule Take 1 capsule (300 mg total) by mouth 3 (three) times daily. 90 capsule 3  . oxyCODONE (ROXICODONE) 15 MG immediate release tablet Take 15 mg by mouth every 4 (four) hours as needed for pain.     No current facility-administered medications for this visit.     REVIEW OF SYSTEMS:  [X]  denotes positive finding, [ ]  denotes negative finding Cardiac  Comments:  Chest pain or chest pressure:    Shortness of  breath upon exertion:    Short of breath when lying flat:    Irregular heart rhythm:        Vascular    Pain in calf, thigh, or hip brought on by ambulation: x   Pain in feet at night that wakes you up from your sleep:     Blood clot in your veins:    Leg swelling:         Pulmonary    Oxygen at home:    Productive cough:     Wheezing:         Neurologic    Sudden weakness in arms or legs:     Sudden numbness in arms or legs:     Sudden onset of difficulty speaking or slurred speech:    Temporary loss of vision in one eye:     Problems with dizziness:         Gastrointestinal    Blood in stool:     Vomited blood:         Genitourinary    Burning when urinating:     Blood in urine:        Psychiatric    Major depression:         Hematologic    Bleeding problems:    Problems with blood clotting too easily:        Skin    Rashes or ulcers: x       Constitutional    Fever or  chills:      PHYSICAL EXAM: Vitals:   05/20/16 0857  BP: 120/76  Pulse: 77  Resp: (!) 22  Temp: 98.6 F (37 C)  TempSrc: Oral  SpO2: 97%  Weight: 118 lb 9.6 oz (53.8 kg)  Height: 5\' 6"  (1.676 m)    GENERAL: The patient is a well-nourished male, in no acute distress. The vital signs are documented above. CARDIAC: There is a regular rate and rhythm.  VASCULAR: Nonpalpable pulse within superficial left anterior tibial bypass graft PULMONARY: There is good air exchange bilaterally without wheezing or rales. ABDOMEN: Soft and non-tender with normal pitched bowel sounds.  MUSCULOSKELETAL: There are no major deformities or cyanosis. NEUROLOGIC: No focal weakness or paresthesias are detected. SKIN: Left great toe ulcer PSYCHIATRIC: The patient has a normal affect.  DATA:  None  MEDICAL ISSUES: The patient's bypass graft is occluded.  This probably happened about 3 weeks ago.  Because he has a toe wound revealed a coin to the operating room to attempt to salvage his bypass graft is the best chance at limb salvage.  I have scheduled this for tomorrow.  If I cannot get his bypass graft open, I will need to consider redoing this with Gore-Tex.  I would also like to start him on anticoagulation after his operation.  Unfortunately, he continues to smoke    Durene Cal, MD [facility] [phone] Pager [phone]

## 2016-05-22 ENCOUNTER — Other Ambulatory Visit: Payer: Medicare Other

## 2016-05-22 ENCOUNTER — Encounter (HOSPITAL_COMMUNITY)
Admission: RE | Disposition: A | Discharge status: Home / Self Care | Payer: Self-pay | Source: Ambulatory Visit | Attending: Surgery

## 2016-05-22 ENCOUNTER — Encounter (HOSPITAL_COMMUNITY): Payer: Self-pay | Admitting: Surgery

## 2016-05-22 ENCOUNTER — Ambulatory Visit (HOSPITAL_COMMUNITY): Admission: RE | Admit: 2016-05-22 | Payer: Medicare Other | Source: Ambulatory Visit | Admitting: Vascular Surgery

## 2016-05-22 DIAGNOSIS — Z79891 Long term (current) use of opiate analgesic: Secondary | ICD-10-CM | POA: Diagnosis not present

## 2016-05-22 HISTORY — PX: PERIPHERAL VASCULAR CATHETERIZATION: SHX172C

## 2016-05-22 LAB — APTT: aPTT: 63 seconds — ABNORMAL HIGH (ref 24–36)

## 2016-05-22 LAB — BASIC METABOLIC PANEL
Anion gap: 4 — ABNORMAL LOW (ref 5–15)
BUN: 7 mg/dL (ref 6–20)
CALCIUM: 8.4 mg/dL — AB (ref 8.9–10.3)
CO2: 31 mmol/L (ref 22–32)
CREATININE: 0.68 mg/dL (ref 0.61–1.24)
Chloride: 103 mmol/L (ref 101–111)
GFR calc non Af Amer: >60 mL/min (ref >60)
GLUCOSE: 91 mg/dL (ref 65–99)
Potassium: 4.3 mmol/L (ref 3.5–5.1)
Sodium: 138 mmol/L (ref 135–145)

## 2016-05-22 LAB — CBC
HCT: 36.9 % — ABNORMAL LOW (ref 39.0–52.0)
HEMOGLOBIN: 11.7 g/dL — AB (ref 13.0–17.0)
MCH: 32.1 pg (ref 26.0–34.0)
MCHC: 31.7 g/dL (ref 30.0–36.0)
MCV: 101.4 fL — ABNORMAL HIGH (ref 78.0–100.0)
PLATELETS: 204 10*3/uL (ref 150–400)
RBC: 3.64 MIL/uL — AB (ref 4.22–5.81)
RDW: 14.4 % (ref 11.5–15.5)
WBC: 10.5 10*3/uL (ref 4.0–10.5)

## 2016-05-22 LAB — POCT ACTIVATED CLOTTING TIME
Activated Clotting Time: 0 seconds
Activated Clotting Time: 213 seconds
Activated Clotting Time: 180 seconds

## 2016-05-22 LAB — HEPARIN LEVEL (UNFRACTIONATED): Heparin Unfractionated: 0.18 IU/mL — ABNORMAL LOW (ref 0.30–0.70)

## 2016-05-22 SURGERY — ABDOMINAL AORTOGRAM W/LOWER EXTREMITY

## 2016-05-22 MED ORDER — LIDOCAINE HCL (PF) 1 % IJ SOLN
INTRAMUSCULAR | Status: AC
  Filled 2016-05-22: qty 30

## 2016-05-22 MED ORDER — IODIXANOL 320 MG/ML IV SOLN
INTRAVENOUS | Status: DC | PRN
  Administered 2016-05-22: 75 mL via INTRA_ARTERIAL

## 2016-05-22 MED ORDER — HEPARIN SODIUM (PORCINE) 1000 UNIT/ML IJ SOLN
INTRAMUSCULAR | Status: DC | PRN
  Administered 2016-05-22: 6000 [IU] via INTRAVENOUS

## 2016-05-22 MED ORDER — HEPARIN SODIUM (PORCINE) 1000 UNIT/ML IJ SOLN
INTRAMUSCULAR | Status: AC
  Filled 2016-05-22: qty 1

## 2016-05-22 MED ORDER — HEPARIN (PORCINE) IN NACL 2-0.9 UNIT/ML-% IJ SOLN
INTRAMUSCULAR | Status: DC | PRN
  Administered 2016-05-22: 1000 mL

## 2016-05-22 MED ORDER — SODIUM CHLORIDE 0.9 % IV SOLN: 1.0000 mL/kg/h | INTRAVENOUS | Status: AC

## 2016-05-22 MED ORDER — LIDOCAINE HCL (PF) 1 % IJ SOLN
INTRAMUSCULAR | Status: DC | PRN
  Administered 2016-05-22: 15 mL

## 2016-05-22 MED ORDER — HEPARIN (PORCINE) IN NACL 100-0.45 UNIT/ML-% IJ SOLN
800.0000 [IU]/h | INTRAMUSCULAR | Status: DC
  Administered 2016-05-22: 800 [IU]/h via INTRAVENOUS
  Filled 2016-05-22: qty 250

## 2016-05-22 MED ORDER — ACETAMINOPHEN 325 MG PO TABS: 650.0000 mg | ORAL_TABLET | ORAL | Status: DC | PRN

## 2016-05-22 MED ORDER — FENTANYL CITRATE (PF) 100 MCG/2ML IJ SOLN
INTRAMUSCULAR | Status: DC | PRN
  Administered 2016-05-22 (×2): 50 ug via INTRAVENOUS

## 2016-05-22 MED ORDER — FENTANYL CITRATE (PF) 100 MCG/2ML IJ SOLN
INTRAMUSCULAR | Status: AC
  Filled 2016-05-22: qty 2

## 2016-05-22 MED ORDER — SODIUM CHLORIDE 0.9 % IV SOLN
INTRAVENOUS | Status: DC | PRN
  Administered 2016-05-22: 10 mL/h via INTRAVENOUS

## 2016-05-22 MED ORDER — HEPARIN (PORCINE) IN NACL 2-0.9 UNIT/ML-% IJ SOLN
INTRAMUSCULAR | Status: AC
  Filled 2016-05-22: qty 500

## 2016-05-22 MED ORDER — MUPIROCIN 2 % EX OINT
1.0000 "application " | TOPICAL_OINTMENT | Freq: Two times a day (BID) | CUTANEOUS | Status: DC
  Administered 2016-05-22 – 2016-05-23 (×2): 1 via NASAL
  Filled 2016-05-22: qty 22

## 2016-05-22 MED ORDER — CHLORHEXIDINE GLUCONATE CLOTH 2 % EX PADS
6.0000 | MEDICATED_PAD | Freq: Every day | CUTANEOUS | Status: DC
  Administered 2016-05-22 – 2016-05-23 (×2): 6 via TOPICAL

## 2016-05-22 MED ORDER — ONDANSETRON HCL 4 MG/2ML IJ SOLN: 4.0000 mg | Freq: Four times a day (QID) | INTRAMUSCULAR | Status: DC | PRN

## 2016-05-22 MED ORDER — MORPHINE SULFATE (PF) 2 MG/ML IV SOLN
INTRAVENOUS | Status: AC
  Filled 2016-05-22: qty 1

## 2016-05-22 MED ORDER — NICOTINE 14 MG/24HR TD PT24
14.0000 mg | MEDICATED_PATCH | Freq: Every day | TRANSDERMAL | Status: DC
  Administered 2016-05-22 – 2016-05-23 (×2): 14 mg via TRANSDERMAL
  Filled 2016-05-22 (×2): qty 1

## 2016-05-22 SURGICAL SUPPLY — 21 items
BALLN MUSTANG 5X80X135 (BALLOONS) ×2
BALLOON MUSTANG 5X80X135 (BALLOONS) ×1 IMPLANT
CATH MUSTANG 3X80X135 (BALLOONS) ×2 IMPLANT
CATH MUSTANG 4X200X135 (BALLOONS) ×2 IMPLANT
CATH OMNI FLUSH 5F 65CM (CATHETERS) ×2 IMPLANT
CATH QUICKCROSS .035X135CM (MICROCATHETER) ×2 IMPLANT
COVER PRB 48X5XTLSCP FOLD TPE (BAG) ×1 IMPLANT
COVER PROBE 5X48 (BAG) ×1
GUIDEWIRE ANGLED .035X260CM (WIRE) ×2 IMPLANT
KIT ENCORE 26 ADVANTAGE (KITS) ×2 IMPLANT
KIT MICROINTRODUCER STIFF 5F (SHEATH) ×2 IMPLANT
KIT PV (KITS) ×2 IMPLANT
SHEATH PINNACLE 5F 10CM (SHEATH) ×2 IMPLANT
SHEATH PINNACLE 7F 10CM (SHEATH) ×2 IMPLANT
SHEATH PINNACLE MP 6F 45CM (SHEATH) ×2 IMPLANT
SHEATH PINNACLE ST 6F 45CM (SHEATH) ×2 IMPLANT
SYR MEDRAD MARK V 150ML (SYRINGE) IMPLANT
TRANSDUCER W/STOPCOCK (MISCELLANEOUS) ×2 IMPLANT
TRAY PV CATH (CUSTOM PROCEDURE TRAY) ×2 IMPLANT
WIRE BENTSON .035X145CM (WIRE) ×2 IMPLANT
WIRE HI TORQ VERSACORE J 260CM (WIRE) ×2 IMPLANT

## 2016-05-22 NOTE — Progress Notes (Signed)
  Progress Note    05/22/2016 9:17 AM 1 Day Post-Op  Subjective:  Feeling well, eating breakfast  Vitals:   05/22/16 0344 05/22/16 0805  BP: (!) 92/56 104/64  Pulse: 64 71  Resp: 18 (!) 22  Temp: 98 F (36.7 C) 97.5 F (36.4 C)    Physical Exam: Cardiac:  rrr Lungs:  Non labored Incisions:  Left lateral thigh incision is cdi with dermabond Extremities:  Left foot is warm and dry, palpable pulse in graft lateral leg Abdomen:  Soft, ntnd  CBC    Component Value Date/Time   WBC 10.5 05/22/2016 0224   RBC 3.64 (L) 05/22/2016 0224   HGB 11.7 (L) 05/22/2016 0224   HCT 36.9 (L) 05/22/2016 0224   PLT 204 05/22/2016 0224   MCV 101.4 (H) 05/22/2016 0224   MCH 32.1 05/22/2016 0224   MCHC 31.7 05/22/2016 0224   RDW 14.4 05/22/2016 0224   LYMPHSABS 1.6 04/08/2016 1417   MONOABS 0.7 04/08/2016 1417   EOSABS 0.1 04/08/2016 1417   BASOSABS 0.0 04/08/2016 1417    BMET    Component Value Date/Time   NA 138 05/22/2016 0224   K 4.3 05/22/2016 0224   CL 103 05/22/2016 0224   CO2 31 05/22/2016 0224   GLUCOSE 91 05/22/2016 0224   BUN 7 05/22/2016 0224   CREATININE 0.68 05/22/2016 0224   CREATININE 0.69 05/11/2014 1533   CALCIUM 8.4 (L) 05/22/2016 0224   GFRNONAA >60 05/22/2016 0224   GFRNONAA >89 04/19/2014 1418   GFRAA >60 05/22/2016 0224   GFRAA >89 04/19/2014 1418    INR    Component Value Date/Time   INR 1.02 05/21/2016 0839     Intake/Output Summary (Last 24 hours) at 05/22/16 0917 Last data filed at 05/22/16 0800  Gross per 24 hour  Intake             3261 ml  Output             1600 ml  Net             1661 ml     Assessment:  70 y.o. male is s/p thrombectomy of lle vein bypass  Plan: Angiogram today with possible intervention. Discussed risks of procedure and patient agrees to proceed under local anesthesia given that he ate breakfast.  Apolinar JunesBrandon C. Randie Heinzain, MD [facility] [place] Office: [phone] Pager:  [phone]  05/22/2016 9:17 AM

## 2016-05-22 NOTE — Progress Notes (Signed)
Site area: rt groin Site Prior to Removal:  Level 0 Pressure Applied For:  25 minutes Manual:   yes Patient Status During Pull:  stable Post Pull Site:  Level  0 Post Pull Instructions Given:  yes Post Pull Pulses Present: yes Dressing Applied:  tegaderm Bedrest begins @ 1705 Comments:

## 2016-05-22 NOTE — Op Note (Signed)
    Patient name: KAHIL AGNER MRN: [id number] DOB: 1945-12-28 Sex: male  05/21/2016 Pre-operative Diagnosis: Occluded left femoral anterior tibial bypass graft Post-operative diagnosis:  Same Surgeon:  Annamarie Major Assistants:  Leontine Locket Procedure:   #1: Thrombectomy of left femoral anterior tibial bypass graft   #2: Balloon angioplasty, left femoral anterior tibial bypass graft Anesthesia:  Gen. Blood Loss:  See anesthesia record Specimens:  None  Findings:  The vein graft appeared sclerotic.  After performing successful thrombectomy, I took a 4 mm angioplasty balloon and performed angioplasty throughout the length of the graft.  He had a palpable graft pulse at the end of the procedure  Indications:  The patient has a history of a left femoral to anterior tibial bypass graft which was done for a left great toe ulcer.  He was seen in the office approximately one month ago and had a palpable graft pulse.  Shortly afterwards, he began having symptoms of claudication.  I saw him in the office yesterday and his bypass graft was occluded.  He comes in today for an attempt at thrombectomy.  Procedure:  The patient was identified in the holding area and taken to [place] 16  The patient was then placed supine on the table. general anesthesia was administered.  The patient was prepped and draped in the usual sterile fashion.  A time out was called and antibiotics were administered.  In the lateral leg just proximal to the knee, made a longitudinal incision over the superficial femoral anterior tibial bypass graft.  The bypass graft was circumferentially dissected free.  There was no pulse in the graft.  The patient was fully heparinized.  I made a transverse venotomy and the graft and identified what appeared to be acute thrombus.  I then performed thrombectomy of the bypass graft using a combination of #3, #4, and #5 Fogarty catheters.  I was able to remove what I felt was all of the clot.  On  inspection of the vein, it did appear sclerotic.  Therefore I selected a 4 mm Mustang balloon and perform balloon angioplasty of the entire length of the bypass graft, taking the balloon to 10 atm on each inflation.  After this, there was a pulse within the graft.  There was moderate pulsatile blood flow through the graft.  This point, I felt the best course of action was to stop and have the bypass graft evaluated with a formal arteriogram to see if there is anything else that needs to be done to salvage his graft.  I started the patient on IV heparin drip.  I closed the venotomy with 2 running 6-0 proline sutures in a transverse fashion.  Hemostasis was achieved with cautery.  I closed the incision with 2 layers of 3-0 Vicryl and Dermabond.  There were no immediate complications.   Disposition:  To PACU in stable condition.   Theotis Burrow, M.D. [facility] [place] Office: [phone] Pager:  [phone]

## 2016-05-22 NOTE — Progress Notes (Signed)
    Subjective  - POD #1, status post thrombectomy of left anterior tibial bypass graft  The patient is complaining of back pain and some shortness of breath   Physical Exam:  He has a palpable pulse within his femoral anterior tibial bypass       Assessment/Plan:  POD #1  Because of the appearance of the vein in the operating room, which appeared sclerotic, I feel he needs to undergo formal angiography today to get a better look at the vein and potentially perform repeat angioplasty if indicated in order to keep this bypass patent.  He is currently on IV heparin.  I will convert him to anticoagulation if we are able to salvage his graft.  His angiography will be performed today  [name], [place] 05/22/2016 9:19 AM --  Vitals:   05/22/16 0344 05/22/16 0805  BP: (!) 92/56 104/64  Pulse: 64 71  Resp: 18 (!) 22  Temp: 98 F (36.7 C) 97.5 F (36.4 C)    Intake/Output Summary (Last 24 hours) at 05/22/16 0919 Last data filed at 05/22/16 0800  Gross per 24 hour  Intake             3261 ml  Output             1600 ml  Net             1661 ml     Laboratory CBC    Component Value Date/Time   WBC 10.5 05/22/2016 0224   HGB 11.7 (L) 05/22/2016 0224   HCT 36.9 (L) 05/22/2016 0224   PLT 204 05/22/2016 0224    BMET    Component Value Date/Time   NA 138 05/22/2016 0224   K 4.3 05/22/2016 0224   CL 103 05/22/2016 0224   CO2 31 05/22/2016 0224   GLUCOSE 91 05/22/2016 0224   BUN 7 05/22/2016 0224   CREATININE 0.68 05/22/2016 0224   CREATININE 0.69 05/11/2014 1533   CALCIUM 8.4 (L) 05/22/2016 0224   GFRNONAA >60 05/22/2016 0224   GFRNONAA >89 04/19/2014 1418   GFRAA >60 05/22/2016 0224   GFRAA >89 04/19/2014 1418    COAG Lab Results  Component Value Date   INR 1.02 05/21/2016   INR 1.11 12/06/2015   INR 1.05 05/11/2014   No results found for: PTT  Antibiotics Anti-infectives    Start     Dose/Rate Route Frequency Ordered Stop   05/21/16 2300  cefUROXime  (ZINACEF) 1.5 g in dextrose 5 % 50 mL IVPB     1.5 g 100 mL/hr over 30 Minutes Intravenous Every 12 hours 05/21/16 1758 05/22/16 2259   05/21/16 1015  cefUROXime (ZINACEF) 1.5 g in dextrose 5 % 50 mL IVPB     1.5 g 100 mL/hr over 30 Minutes Intravenous 30 min pre-op 05/21/16 0839 05/21/16 1238   05/21/16 0842  dextrose 5 % with cefUROXime (ZINACEF) ADS Med    Comments:  Lorette Angosenberger, Meredit: cabinet override      05/21/16 0842 05/21/16 1238       V. Charlena CrossWells Birttany Dechellis IV, M.D. [facility] [place] Office: [phone] Pager:  [phone]

## 2016-05-22 NOTE — Progress Notes (Signed)
ANTICOAGULATION CONSULT NOTE - Follow-up Consult  Pharmacy Consult for Heparin IV drip  Indication: s/p THROMBECTOMY FEMORAL TO ANTERIOR TIBIAL BYPASS GRAFT. OK for pharmacy to follow and adjust down, but do not increase rate even when level is low.   Allergies  Allergen Reactions  . Clindamycin/Lincomycin Diarrhea    Patient Measurements: Height: 5\' 6"  (167.6 cm) Weight: 124 lb 6.4 oz (56.4 kg) IBW/kg (Calculated) : 63.8 Heparin Dosing Weight: = TBW = 53.8 kg  Vital Signs: Temp: 98 F (36.7 C) (10/04 0344) Temp Source: Oral (10/04 0344) BP: 92/56 (10/04 0344) Pulse Rate: 64 (10/04 0344)  Labs:  Recent Labs  05/21/16 0839 05/22/16 0224  HGB 12.7* 11.7*  HCT 39.1 36.9*  PLT 232 204  APTT 33 63*  LABPROT 13.4  --   INR 1.02  --   HEPARINUNFRC  --  0.18*  CREATININE 0.78 0.68    Estimated Creatinine Clearance: 69.5 mL/min (by C-G formula based on SCr of 0.68 mg/dL).  Assessment: 70 y.o male who is s/p thomobectomy and balloon angioplasty of left femoral to popliteal bypass grafting.  Patient has been placed on IV heparin drip at 800 units/hr per vascular surgeon, Dr. Myra GianottiBrabham. Pharmacy consulted to follow and adjust rate down, but do not increase rate even when heparin level is low.  Vascular surgery notes that patient is to go  for arteriogram tomorrow and possible intervention, and to continue heparin. He was not on anticoagulation prior to admission.   Heparin level 0.18 on gtt at 800 units/hr.    Goal of Therapy:  May adjust rate down if level is > 0.7 Monitor platelets by anticoagulation protocol: Yes   Plan:  Continue IV heparin drip at 800 units/hr Daily heparin level, CBC Will f/u post arteriogram today Pharmacist may adjust rate down if level is high (> 0.7 units/ml).   Christoper Fabianaron Lorris Carducci, PharmD, BCPS Clinical pharmacist, pager [phone] 05/22/2016,3:47 AM

## 2016-05-22 NOTE — Progress Notes (Signed)
ANTICOAGULATION CONSULT NOTE - Follow-up Consult  Pharmacy Consult for Heparin IV drip  Indication: s/p THROMBECTOMY FEMORAL TO ANTERIOR TIBIAL BYPASS GRAFT.   Allergies  Allergen Reactions  . Clindamycin/Lincomycin Diarrhea    Patient Measurements: Height: 5\' 6"  (167.6 cm) Weight: 124 lb 6.4 oz (56.4 kg) IBW/kg (Calculated) : 63.8 Heparin Dosing Weight: = TBW = 53.8 kg  Vital Signs: Temp: 97.9 F (36.6 C) (10/04 1222) Temp Source: Oral (10/04 1222) BP: 113/48 (10/04 1720) Pulse Rate: 62 (10/04 1720)  Labs:  Recent Labs  05/21/16 0839 05/22/16 0224  HGB 12.7* 11.7*  HCT 39.1 36.9*  PLT 232 204  APTT 33 63*  LABPROT 13.4  --   INR 1.02  --   HEPARINUNFRC  --  0.18*  CREATININE 0.78 0.68    Estimated Creatinine Clearance: 69.5 mL/min (by C-G formula based on SCr of 0.68 mg/dL).  Assessment: 70 y.o male who is s/p thomobectomy and balloon angioplasty of left femoral to popliteal bypass grafting and is now s/p arteriogram.  Previously, Pharmacy consulted to follow and adjust rate down, but do not increase rate even when heparin level is low.    He was not on anticoagulation prior to admission.   Heparin level 0.18 on gtt at 800 units/hr this morning. Rate was not adjusted as per request. Now to resume heparin 4h after sheath removal. Sheath removed at 1529. No immediate complications noted.  Goal of Therapy:  May adjust rate down if level is > 0.7 Monitor platelets by anticoagulation protocol: Yes   Plan:  Restart IV heparin drip at 800 units/hr at 1930 this evening Daily heparin level, CBC   Unique Sillas D. Susano Cleckler, PharmD, BCPS Clinical Pharmacist Pager: [phone] 05/22/2016 5:55 PM

## 2016-05-22 NOTE — Op Note (Signed)
    Patient name: Christopher GageDavid H Pena MRN: [id number] DOB: [birth year] Sex: male  05/22/2016 Pre-operative Diagnosis: pending graft failure left fem-AT vein bypass Post-operative diagnosis:  Same Surgeon:  Luanna SalkBrandon C. Randie Heinzain, MD Procedure Performed: 1.  US guided cannulation of right common femoral artery 2.  Balloon angioplasty distal anastomosis and AT artery with 3mm balloon 3.  Balloon angioplasty vein bypass with 4mm balloon 4.  Balloon angioplasty proximal anastomosis with 5mm balloon 5.  Left lower extremity angiogram  Indications:  70 year old white male recent history of a left femoral to anterior tibial artery bypass with vein. He has recent thrombosis of the vein bypass and underwent open thrombectomy the day prior to this procedure. He is now indicated for angiogram with possible endovascular intervention of his left lower extremity bypass.  Findings: There was a tight stenosis at the proximal bifurcation resolved post balloon angioplasty. Breasts the vein was mostly sclerotic and the distal anastomosis also had a stenosis resolved post angioplasty. He does have a large ATV that runs off filling the artery in the foot. He also has a peroneal that is quite large and was filling prior to revision of his bypass vein graft.   Procedure:  The patient was identified in the holding area and taken to room 8.  The patient was then placed supine on the table and prepped and draped in the usual sterile fashion.  A time out was called.  Ultrasound was used to evaluate the right common femoral artery.  It was patent .  A digital ultrasound image was acquired.  A micropuncture needle was used to access the right common femoral artery under ultrasound guidance.  An 018 wire was advanced without resistance and a micropuncture sheath was placed.  The 018 wire was removed and a benson wire was placed.  The micropuncture sheath was exchanged for a 5 french sheath.  An omniflush catheter was advanced over the wire  and used to go up and over into the left external iliac artery. Left lower extremity angiography was then performed with the findings above. Patient was then heparinized and 6 French sheath placed into his left common femoral artery. Bypass graft was selected with Glidewire which passed easily into the anterior tibial artery distally. We then performed balloon angioplasty of the distal anastomosis with 3 mm balloon followed by proximal anastomosis of the 5 mm balloon. Angiogram M demonstrated very minimal runoff through the bypass. 4 mm angioplasty of the entire vein bypass was then performed and completion angiogram demonstrated preferential flow into the bypass with runoff via the AT filling in the arch and the foot. The sheath was then withdrawn into the right iliofemoral system wire removed the sheath flushed. He'll be transferred to recovery where the sheath we pulled and pressure held. Patient procedure well without immediate complications.  Contrast: 75cc  Omkar Stratmann C. Randie Heinzain, MD [facility] [place] Office: [phone] Pager: [phone]

## 2016-05-23 ENCOUNTER — Encounter (HOSPITAL_COMMUNITY): Payer: Self-pay | Admitting: Vascular Surgery

## 2016-05-23 ENCOUNTER — Telehealth: Payer: Self-pay | Admitting: Surgery

## 2016-05-23 LAB — APTT: APTT: 73 s — AB (ref 24–36)

## 2016-05-23 LAB — CBC
HCT: 34.6 % — ABNORMAL LOW (ref 39.0–52.0)
Hemoglobin: 11.1 g/dL — ABNORMAL LOW (ref 13.0–17.0)
MCH: 31.8 pg (ref 26.0–34.0)
MCHC: 32.1 g/dL (ref 30.0–36.0)
MCV: 99.1 fL (ref 78.0–100.0)
PLATELETS: 189 10*3/uL (ref 150–400)
RBC: 3.49 MIL/uL — ABNORMAL LOW (ref 4.22–5.81)
RDW: 13.9 % (ref 11.5–15.5)
WBC: 9.8 10*3/uL (ref 4.0–10.5)

## 2016-05-23 LAB — HEPARIN LEVEL (UNFRACTIONATED): Heparin Unfractionated: 0.16 IU/mL — ABNORMAL LOW (ref 0.30–0.70)

## 2016-05-23 MED ORDER — CLOPIDOGREL BISULFATE 75 MG PO TABS: 75.0000 mg | ORAL_TABLET | Freq: Every day | ORAL | 11 refills | Status: DC

## 2016-05-23 MED ORDER — OXYCODONE-ACETAMINOPHEN 5-325 MG PO TABS: 1.0000 | ORAL_TABLET | Freq: Four times a day (QID) | ORAL | 0 refills | Status: DC | PRN

## 2016-05-23 MED ORDER — GABAPENTIN 300 MG PO CAPS: 300.0000 mg | ORAL_CAPSULE | Freq: Three times a day (TID) | ORAL | Status: DC | PRN

## 2016-05-23 NOTE — Progress Notes (Addendum)
[facility] [place]  Subjective  - Doing OK, needs increased pain medication.  Chronic pain management out patient.   Objective 119/66 80 98.5 F (36.9 C) (Oral) 14 97%  Intake/Output Summary (Last 24 hours) at 05/23/16 0747 Last data filed at 05/23/16 0500  Gross per 24 hour  Intake           798.35 ml  Output             1295 ml  Net          -496.65 ml    Doppler PT bilaterally  Incision healing well, with out hematoma No change left toe ulcer   Assessment/Planning:  POD #1  Dr. Myra GianottiBrabham Procedure:   #1: Thrombectomy of left femoral anterior tibial bypass graft                         #2: Balloon angioplasty, left femoral anterior tibial bypass graft Dr. Randie Heinzain Procedure Performed: 1.  US guided cannulation of right common femoral artery 2.  Balloon angioplasty distal anastomosis and AT artery with 3mm balloon 3.  Balloon angioplasty vein bypass with 4mm balloon 4.  Balloon angioplasty proximal anastomosis with 5mm balloon 5.  Left lower extremity angiogram  On heparin D/C planning per Dr. Diamond NickelBrabham   COLLINS, [facility] [place] 05/23/2016 7:47 AM --  Laboratory Lab Results:  Recent Labs  05/22/16 0224 05/23/16 0445  WBC 10.5 9.8  HGB 11.7* 11.1*  HCT 36.9* 34.6*  PLT 204 189   BMET  Recent Labs  05/21/16 0839 05/22/16 0224  NA 139 138  K 3.5 4.3  CL 102 103  CO2 31 31  GLUCOSE 128* 91  BUN 15 7  CREATININE 0.78 0.68  CALCIUM 8.8* 8.4*    COAG Lab Results  Component Value Date   INR 1.02 05/21/2016   INR 1.11 12/06/2015   INR 1.05 05/11/2014   No results found for: PTT  Palpable graft pulse D/c home with Plavix F/U with me in 1 month Nees to see wound center next week   Durene CalWells Tamika Nou

## 2016-05-23 NOTE — Telephone Encounter (Signed)
LVM on home # for post op appt on 06/10/16

## 2016-05-23 NOTE — Progress Notes (Signed)
ANTICOAGULATION CONSULT NOTE - Follow-up Consult  Pharmacy Consult for Heparin IV drip  Indication: s/p THROMBECTOMY FEMORAL TO ANTERIOR TIBIAL BYPASS GRAFT.   Allergies  Allergen Reactions  . Clindamycin/Lincomycin Diarrhea    Patient Measurements: Height: 5\' 6"  (167.6 cm) Weight: 124 lb 6.4 oz (56.4 kg) IBW/kg (Calculated) : 63.8 Heparin Dosing Weight: = TBW = 53.8 kg  Vital Signs: Temp: 97.8 F (36.6 C) (10/05 0845) Temp Source: Oral (10/05 0845) BP: 104/87 (10/05 0845) Pulse Rate: 68 (10/05 0845)  Labs:  Recent Labs  05/21/16 0839 05/22/16 0224 05/23/16 0445  HGB 12.7* 11.7* 11.1*  HCT 39.1 36.9* 34.6*  PLT 232 204 189  APTT 33 63* 73*  LABPROT 13.4  --   --   INR 1.02  --   --   HEPARINUNFRC  --  0.18* 0.16*  CREATININE 0.78 0.68  --     Estimated Creatinine Clearance: 69.5 mL/min (by C-G formula based on SCr of 0.68 mg/dL).  Assessment: 70 y.o male who is s/p thomobectomy and balloon angioplasty of left femoral to popliteal bypass grafting and is now s/p arteriogram (He was not on anticoagulation prior to admission).  Previously, Pharmacy consulted to follow and adjust rate down, but do not increase rate even when heparin level is low.  Plans noted for discharge on plavix. -heparin level = 0.16  Goal of Therapy:  May adjust rate down if level is > 0.7 Monitor platelets by anticoagulation protocol: Yes   Plan:  -No heparin changes needed -Plans for discharge today per MD  Harland GermanAndrew Dequandre Cordova, Pharm D 05/23/2016 10:50 AM

## 2016-05-23 NOTE — Progress Notes (Signed)
Received order to d/c patient.  Provided patient with education.  PIV removed.  Answered all questions.

## 2016-05-23 NOTE — Telephone Encounter (Signed)
-----   Message from Sharee PimpleMarilyn K McChesney, RN sent at 05/23/2016 10:33 AM EDT ----- Regarding: schedule   ----- Message ----- From: Dara LordsSamantha J Rhyne, PA-C Sent: 05/23/2016  10:00 AM To: Vvs Charge Pool  S/p #1: Thrombectomy of left femoral anterior tibial bypass graft #2: Balloon angioplasty, left femoral anterior tibial bypass graft  F/u with Dr. Myra GianottiBrabham in 2 weeks.  Thanks

## 2016-05-23 NOTE — Care Management Note (Signed)
Case Management Note  Patient Details  Name: Christopher Pena MRN: [id number] Date of Birth: 06-09-1946  Subjective/Objective:   S/p aortogram and balloon bypass, from home , pta indep.  For dc , no needs.                 Action/Plan:   Expected Discharge Date:                  Expected Discharge Plan:  Home/Self Care  In-House Referral:     Discharge planning Services  CM Consult  Post Acute Care Choice:    Choice offered to:     DME Arranged:    DME Agency:     HH Arranged:    HH Agency:     Status of Service:  Completed, signed off  If discussed at [employer] Length of Stay Meetings, dates discussed:    Additional Comments:  Leone Havenaylor, Winfred Iiams [place], RN 05/23/2016, 2:22 PM

## 2016-05-29 ENCOUNTER — Other Ambulatory Visit: Payer: Medicare Other

## 2016-05-29 ENCOUNTER — Other Ambulatory Visit: Payer: Self-pay

## 2016-05-29 DIAGNOSIS — L97929 Non-pressure chronic ulcer of unspecified part of left lower leg with unspecified severity: Secondary | ICD-10-CM

## 2016-05-29 DIAGNOSIS — I70245 Atherosclerosis of native arteries of left leg with ulceration of other part of foot: Secondary | ICD-10-CM

## 2016-05-29 NOTE — Discharge Summary (Signed)
[facility] Discharge Summary   Patient ID:  Christopher Pena MRN: [id number] DOB/AGE: 1945/10/04 70 y.o.  Admit date: 05/21/2016 Discharge date: 05/23/2016 Date of Surgery: 05/21/2016 - 05/22/2016 Surgeon: Surgeon(s): Maeola HarmanBrandon Christopher Cain, MD  Admission Diagnosis: Occluded left femoral to anterior tibial bypass graft T82.858A pvd  Discharge Diagnoses:  Occluded left femoral to anterior tibial bypass graft T82.858A pvd  Secondary Diagnoses: Past Medical History:  Diagnosis Date  . Anxiety   . Chronic low back pain    "degenerative spine dx'd ~ 7 yr ago" (06/08/2013)  . Claudication (HCC)   . COPD (chronic obstructive pulmonary disease) (HCC)   . Emphysema of lung (HCC)   . Hypertension    "moderately high; RX didn't help" (06/08/2013)  . Neuromuscular disorder (HCC)    degen spine  . Non-healing wound of lower extremity 06/09/2013  . PAD (peripheral artery disease), PTA/stent IDEV & chocolate baloon 06/08/13 04/13/2013   Severely reduced ABI's of 0.3 bilaterally   . Shortness of breath    w/ exertion  . Tobacco abuse   . Tobacco use 06/09/2013  . Unexplained weight loss     Procedure(s): Abdominal Aortogram w/Lower Extremity  Discharged Condition: good  HPI: Christopher BalesDavid H [name] a 70 y.o.malewho is status post left femoral to anterior tibial bypass graft on 12/10/2015. This was done for a wound on his left great toe. He has a history of a right femoral to below-knee popliteal bypass graft with vein which has occluded.  He was last seen on 04/15/2016 and his bypass graft was patent.  He states that several days after this he began having worsening claudication symptoms and cannot feel a pulse within his graft.   Hospital Course:  Christopher Pena is a 70 y.o. male is S/P  Procedure(s): Procedure:   #1: Thrombectomy of left femoral anterior tibial bypass graft                         #2: Balloon angioplasty, left femoral anterior tibial bypass  graft Surgeon:  Luanna SalkBrandon C. Randie Heinzain, MD Procedure Performed: 1.  US guided cannulation of right common femoral artery 2.  Balloon angioplasty distal anastomosis and AT artery with 3mm balloon 3.  Balloon angioplasty vein bypass with 4mm balloon 4.  Balloon angioplasty proximal anastomosis with 5mm balloon 5.  Left lower extremity angiogram  POD#1 Doppler PT bilaterally  Incision healing well, with out hematoma No change left toe ulcer  Palpable graft pulse D/c home with Plavix F/U with me in 1 month Nees to see wound center next week   Durene CalWells Brabham Consults:  Treatment Team:  Maeola HarmanBrandon Christopher Cain, MD  Significant Diagnostic Studies: CBC Lab Results  Component Value Date   WBC 9.8 05/23/2016   HGB 11.1 (L) 05/23/2016   HCT 34.6 (L) 05/23/2016   MCV 99.1 05/23/2016   PLT 189 05/23/2016    BMET    Component Value Date/Time   NA 138 05/22/2016 0224   K 4.3 05/22/2016 0224   CL 103 05/22/2016 0224   CO2 31 05/22/2016 0224   GLUCOSE 91 05/22/2016 0224   BUN 7 05/22/2016 0224   CREATININE 0.68 05/22/2016 0224   CREATININE 0.69 05/11/2014 1533   CALCIUM 8.4 (L) 05/22/2016 0224   GFRNONAA >60 05/22/2016 0224   GFRNONAA >89 04/19/2014 1418   GFRAA >60 05/22/2016 0224   GFRAA >89 04/19/2014 1418   COAG Lab Results  Component Value Date   INR 1.02 05/21/2016  INR 1.11 12/06/2015   INR 1.05 05/11/2014     Disposition:  Discharge to :Home Discharge Instructions    Call MD for:  redness, tenderness, or signs of infection (pain, swelling, bleeding, redness, odor or green/yellow discharge around incision site)    Complete by:  As directed    Call MD for:  severe or increased pain, loss or decreased feeling  in affected limb(s)    Complete by:  As directed    Call MD for:  temperature >100.5    Complete by:  As directed    Discharge wound care:    Complete by:  As directed    Wash the groin wound with soap and water daily and pat dry. (No tub bath-only shower)   Then put a dry gauze or washcloth there to keep this area dry daily and as needed.  Do not use Vaseline or neosporin on your incisions.  Only use soap and water on your incisions and then protect and keep dry.   Driving Restrictions    Complete by:  As directed    No driving for 2 weeks   Lifting restrictions    Complete by:  As directed    No lifting for 4 weeks   Resume previous diet    Complete by:  As directed        Medication List    TAKE these medications   albuterol 108 (90 Base) MCG/ACT inhaler Commonly known as:  PROAIR HFA Inhale 2 puffs into the lungs every 4 (four) hours as needed for wheezing or shortness of breath.   aspirin EC 81 MG tablet Take 81 mg by mouth daily.   cetirizine 10 MG tablet Commonly known as:  ZYRTEC Take 1 tablet (10 mg total) by mouth daily.   clopidogrel 75 MG tablet Commonly known as:  PLAVIX Take 1 tablet (75 mg total) by mouth daily.   fluticasone 50 MCG/ACT nasal spray Commonly known as:  FLONASE Place 2 sprays into both nostrils daily. What changed:  when to take this  reasons to take this   fluticasone furoate-vilanterol 100-25 MCG/INH Aepb Commonly known as:  BREO ELLIPTA Inhale 1 puff into the lungs daily.   gabapentin 300 MG capsule Commonly known as:  NEURONTIN Take 1 capsule (300 mg total) by mouth 3 (three) times daily as needed (pain).   oxyCODONE 15 MG immediate release tablet Commonly known as:  ROXICODONE Take 15 mg by mouth every 4 (four) hours as needed for pain.   oxyCODONE-acetaminophen 5-325 MG tablet Commonly known as:  ROXICET Take 1 tablet by mouth every 6 (six) hours as needed for moderate pain or severe pain.      Verbal and written Discharge instructions given to the patient. Wound care per Discharge AVS Follow-up Information    Durene Cal, MD In 2 weeks.   Specialties:  Vascular Surgery, Cardiology Why:  Office will call you to arrange your appt (sent) Contact information: [address] [place] [address] [phone]           Signed: [facility] 05/29/2016, 10:07 AM

## 2016-06-06 ENCOUNTER — Encounter: Payer: Self-pay | Admitting: Surgery

## 2016-06-06 DIAGNOSIS — M5416 Radiculopathy, lumbar region: Secondary | ICD-10-CM | POA: Diagnosis not present

## 2016-06-06 DIAGNOSIS — G894 Chronic pain syndrome: Secondary | ICD-10-CM | POA: Diagnosis not present

## 2016-06-06 DIAGNOSIS — G603 Idiopathic progressive neuropathy: Secondary | ICD-10-CM | POA: Diagnosis not present

## 2016-06-06 DIAGNOSIS — G541 Lumbosacral plexus disorders: Secondary | ICD-10-CM | POA: Diagnosis not present

## 2016-06-06 DIAGNOSIS — M5418 Radiculopathy, sacral and sacrococcygeal region: Secondary | ICD-10-CM | POA: Diagnosis not present

## 2016-06-10 ENCOUNTER — Telehealth: Payer: Self-pay | Admitting: Family Medicine

## 2016-06-10 ENCOUNTER — Other Ambulatory Visit: Payer: Self-pay | Admitting: Family Medicine

## 2016-06-10 ENCOUNTER — Ambulatory Visit (INDEPENDENT_AMBULATORY_CARE_PROVIDER_SITE_OTHER): Payer: Medicare Other | Admitting: Surgery

## 2016-06-10 VITALS — BP 120/73 | HR 86 | Temp 97.7°F | Resp 14 | Ht 66.0 in | Wt 117.0 lb

## 2016-06-10 DIAGNOSIS — I70212 Atherosclerosis of native arteries of extremities with intermittent claudication, left leg: Secondary | ICD-10-CM

## 2016-06-10 NOTE — Telephone Encounter (Signed)
What order are they referring to?

## 2016-06-10 NOTE — Telephone Encounter (Signed)
The one that was ordered on 9/25 CT chest w/o contrast that was canceled.

## 2016-06-10 NOTE — Progress Notes (Signed)
Patient name: Felicita GageDavid H Allbee MRN: [id number] DOB: Mar 28, 1946 Sex: male  REASON FOR VISIT: post-op  HPI: Sandria BalesDavid H [name] a 70 y.o.malewho is status post left femoral to anterior tibial bypass graft on 12/10/2015. This was done for a wound on his left great toe. He has a history of a right femoral to below-knee popliteal bypass graft with vein which has occluded.  He was last seen on 04/15/2016 and his bypass graft was patent.  On 05/20/2016 he came back in the office without a pulse in his bypass graft.  He stated that have been going on for approximately 3 weeks.  He had also developed a wound on his great toe.  On 05/21/2016 he was taken to the operating room for a thrombectomy of his superficial femoral anterior tibial bypass graft.  I was able to get the bypass graft open, however the vein appeared to be sclerotic.  I performed angioplasty blindly in the operating room.  The following day he continued to have a pulse within the graft and therefore I had him taken to the Angio suite for arteriogram and additional balloon angioplasty with a 4 mm balloon.  He was discharged home on Plavix.  He is here today stating his left leg feels better area for some reason he states that he has worsening numbness in his right leg.  Prior to this hospitalization he had numbness down to the ankle.  Now he goes across the foot.  He does walk with this foot.  Current Outpatient Prescriptions  Medication Sig Dispense Refill  . albuterol (PROAIR HFA) 108 (90 Base) MCG/ACT inhaler Inhale 2 puffs into the lungs every 4 (four) hours as needed for wheezing or shortness of breath. 1 Inhaler 6  . aspirin EC 81 MG tablet Take 81 mg by mouth daily.    . cetirizine (ZYRTEC) 10 MG tablet Take 1 tablet (10 mg total) by mouth daily. 30 tablet 11  . clopidogrel (PLAVIX) 75 MG tablet Take 1 tablet (75 mg total) by mouth daily. 30 tablet 11  . fluticasone (FLONASE) 50 MCG/ACT nasal spray  Place 2 sprays into both nostrils daily. (Patient taking differently: Place 2 sprays into both nostrils daily as needed for allergies. ) 16 g 6  . fluticasone furoate-vilanterol (BREO ELLIPTA) 100-25 MCG/INH AEPB Inhale 1 puff into the lungs daily. 60 each 0  . gabapentin (NEURONTIN) 300 MG capsule Take 1 capsule (300 mg total) by mouth 3 (three) times daily as needed (pain).    Marland Kitchen. oxyCODONE (ROXICODONE) 15 MG immediate release tablet Take 15 mg by mouth every 4 (four) hours as needed for pain.     No current facility-administered medications for this visit.     REVIEW OF SYSTEMS:  [X]  denotes positive finding, [ ]  denotes negative finding Cardiac  Comments:  Chest pain or chest pressure:    Shortness of breath upon exertion:    Short of breath when lying flat:    Irregular heart rhythm:    Constitutional    Fever or chills:      PHYSICAL EXAM: Vitals:   06/10/16 0829  BP: 120/73  Pulse: 86  Resp: 14  Temp: 97.7 F (36.5 C)  TempSrc: Oral  SpO2: 97%  Weight: 117 lb (53.1 kg)  Height: 5\' 6"  (1.676 m)    GENERAL: The patient is a well-nourished male, in no acute distress. The vital signs are documented above. CARDIOVASCULAR: There is a regular rate and rhythm. PULMONARY: There is good air exchange bilaterally  without wheezing or rales. Palpable graft pulse. The right foot is warm and well perfused  MEDICAL ISSUES: I'm referring the patient to the wound center for evaluation of the left second and great toe. He will be closely followed with surveillance imaging with a low threshold to intervene should he develop recurrent stenosis.  He understands that he is at risk for graft failure because of the sclerotic vein.  He again was counseled on smoking cessation.  Durene Cal, MD [facility] [phone] Pager [phone]

## 2016-06-10 NOTE — Telephone Encounter (Signed)
Call[facility] with [facility] is asking for a new order to be placed in Epic, pt had to cancel last appt.

## 2016-06-10 NOTE — Telephone Encounter (Signed)
These orders are still active in EPIC.

## 2016-06-12 ENCOUNTER — Encounter (HOSPITAL_BASED_OUTPATIENT_CLINIC_OR_DEPARTMENT_OTHER): Payer: Self-pay

## 2016-06-12 ENCOUNTER — Encounter (HOSPITAL_BASED_OUTPATIENT_CLINIC_OR_DEPARTMENT_OTHER): Payer: Medicare Other | Attending: Surgery

## 2016-06-12 DIAGNOSIS — L84 Corns and callosities: Secondary | ICD-10-CM | POA: Diagnosis not present

## 2016-06-12 DIAGNOSIS — Z7982 Long term (current) use of aspirin: Secondary | ICD-10-CM | POA: Insufficient documentation

## 2016-06-12 DIAGNOSIS — Z7951 Long term (current) use of inhaled steroids: Secondary | ICD-10-CM | POA: Insufficient documentation

## 2016-06-12 DIAGNOSIS — Z79899 Other long term (current) drug therapy: Secondary | ICD-10-CM | POA: Insufficient documentation

## 2016-06-12 DIAGNOSIS — J439 Emphysema, unspecified: Secondary | ICD-10-CM | POA: Diagnosis not present

## 2016-06-12 DIAGNOSIS — L97522 Non-pressure chronic ulcer of other part of left foot with fat layer exposed: Secondary | ICD-10-CM | POA: Diagnosis not present

## 2016-06-12 DIAGNOSIS — F17200 Nicotine dependence, unspecified, uncomplicated: Secondary | ICD-10-CM | POA: Diagnosis not present

## 2016-06-12 DIAGNOSIS — Z87891 Personal history of nicotine dependence: Secondary | ICD-10-CM | POA: Diagnosis not present

## 2016-06-12 DIAGNOSIS — I10 Essential (primary) hypertension: Secondary | ICD-10-CM | POA: Diagnosis not present

## 2016-06-12 DIAGNOSIS — Z7902 Long term (current) use of antithrombotics/antiplatelets: Secondary | ICD-10-CM | POA: Diagnosis not present

## 2016-06-12 DIAGNOSIS — Z95820 Peripheral vascular angioplasty status with implants and grafts: Secondary | ICD-10-CM | POA: Insufficient documentation

## 2016-06-12 DIAGNOSIS — I70245 Atherosclerosis of native arteries of left leg with ulceration of other part of foot: Secondary | ICD-10-CM | POA: Diagnosis not present

## 2016-06-12 DIAGNOSIS — L97521 Non-pressure chronic ulcer of other part of left foot limited to breakdown of skin: Secondary | ICD-10-CM | POA: Insufficient documentation

## 2016-06-18 ENCOUNTER — Encounter: Payer: Self-pay | Admitting: Internal Medicine

## 2016-06-18 ENCOUNTER — Ambulatory Visit (INDEPENDENT_AMBULATORY_CARE_PROVIDER_SITE_OTHER): Payer: Medicare Other | Admitting: Internal Medicine

## 2016-06-18 ENCOUNTER — Encounter (INDEPENDENT_AMBULATORY_CARE_PROVIDER_SITE_OTHER): Payer: Medicare Other | Admitting: Internal Medicine

## 2016-06-18 VITALS — BP 110/70 | HR 84 | Ht 67.0 in | Wt 118.0 lb

## 2016-06-18 DIAGNOSIS — J449 Chronic obstructive pulmonary disease, unspecified: Secondary | ICD-10-CM

## 2016-06-18 DIAGNOSIS — F17210 Nicotine dependence, cigarettes, uncomplicated: Secondary | ICD-10-CM

## 2016-06-18 LAB — PULMONARY FUNCTION TEST
DL/VA % pred: 41 %
DL/VA: 1.81 ml/min/mmHg/L
DLCO UNC % PRED: 37 %
DLCO UNC: 10.49 ml/min/mmHg
DLCO cor % pred: 38 %
DLCO cor: 10.84 ml/min/mmHg
FEF 25-75 PRE: 0.72 L/s
FEF 25-75 Post: 0.87 L/sec
FEF2575-%Change-Post: 20 %
FEF2575-%PRED-PRE: 32 %
FEF2575-%Pred-Post: 39 %
FEV1-%CHANGE-POST: 9 %
FEV1-%PRED-POST: 49 %
FEV1-%PRED-PRE: 45 %
FEV1-POST: 1.44 L
FEV1-Pre: 1.31 L
FEV1FVC-%Change-Post: 7 %
FEV1FVC-%PRED-PRE: 70 %
FEV6-%CHANGE-POST: 2 %
FEV6-%PRED-PRE: 67 %
FEV6-%Pred-Post: 69 %
FEV6-POST: 2.56 L
FEV6-Pre: 2.5 L
FEV6FVC-%Change-Post: 0 %
FEV6FVC-%PRED-POST: 106 %
FEV6FVC-%Pred-Pre: 105 %
FVC-%Change-Post: 1 %
FVC-%Pred-Post: 65 %
FVC-%Pred-Pre: 63 %
FVC-PRE: 2.51 L
FVC-Post: 2.56 L
POST FEV6/FVC RATIO: 100 %
PRE FEV1/FVC RATIO: 52 %
Post FEV1/FVC ratio: 56 %
Pre FEV6/FVC Ratio: 100 %
RV % pred: 164 %
RV: 3.77 L
TLC % PRED: 101 %
TLC: 6.54 L

## 2016-06-18 MED ORDER — BUDESONIDE 0.25 MG/2ML IN SUSP: RESPIRATORY_TRACT | 12 refills | Status: DC

## 2016-06-18 MED ORDER — FORMOTEROL FUMARATE 20 MCG/2ML IN NEBU: 20.0000 ug | INHALATION_SOLUTION | Freq: Two times a day (BID) | RESPIRATORY_TRACT | 11 refills | Status: DC

## 2016-06-18 NOTE — Patient Instructions (Addendum)
Plan A = Automatic = Performist 20 mcg and Budesonide 0.25 mg every 12 hours  per Neb (Medicare Part B)   Plan B = Backup Only use your albuterol as a rescue medication to be used if you can't catch your breath by resting or doing a relaxed purse lip breathing pattern.  - The less you use it, the better it will work when you need it. - Ok to use the inhaler up to 2 puffs  every 4 hours if you must but call for appointment if use goes up over your usual need - Don't leave home without it !!  (think of it like the spare tire for your car)   Please see patient coordinator before you leave today  to schedule neb and meds from neb company   The key is to stop smoking completely before smoking completely stops you - it's not too late !  Please schedule a follow up office visit in 6 weeks, call sooner if needed
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
## 2016-06-19 ENCOUNTER — Telehealth: Payer: Self-pay | Admitting: Internal Medicine

## 2016-06-19 ENCOUNTER — Encounter (HOSPITAL_BASED_OUTPATIENT_CLINIC_OR_DEPARTMENT_OTHER): Payer: Medicare Other | Attending: Surgery

## 2016-06-19 DIAGNOSIS — I70245 Atherosclerosis of native arteries of left leg with ulceration of other part of foot: Secondary | ICD-10-CM | POA: Insufficient documentation

## 2016-06-19 DIAGNOSIS — F17200 Nicotine dependence, unspecified, uncomplicated: Secondary | ICD-10-CM | POA: Diagnosis not present

## 2016-06-19 DIAGNOSIS — L84 Corns and callosities: Secondary | ICD-10-CM | POA: Insufficient documentation

## 2016-06-19 DIAGNOSIS — J439 Emphysema, unspecified: Secondary | ICD-10-CM | POA: Insufficient documentation

## 2016-06-19 DIAGNOSIS — L97521 Non-pressure chronic ulcer of other part of left foot limited to breakdown of skin: Secondary | ICD-10-CM | POA: Insufficient documentation

## 2016-06-19 NOTE — Telephone Encounter (Signed)
Called and spoke with Christopher Pena and she is going to get the medications filled for the pt. Nothing further is needed.

## 2016-06-19 NOTE — Assessment & Plan Note (Signed)

## 2016-06-19 NOTE — Assessment & Plan Note (Signed)
Spirometry 04/30/2016  FEV1 1.23 (49%)  Ratio 67 - 04/30/2016  Walked RA x 3 laps @ 185 ft each stopped due to  End of study, fast pace pace, sob with desats at 88%    - 04/30/2016 rec trial of BREO > no better - PFT's  06/18/2016  FEV1 1.44 (49 % ) ratio 56  p 9 % improvement from saba p BREO prior to study with DLCO  37/38 % corrects to 41  % for alv volume    .Pt is Group B in terms of symptom/risk and laba/lama therefore appropriate rx at this point and I suggested a trial of Bevespi since he uses so much hfa saba but he's convinced the only thing that works is a neb based on his fm members experience and his own response to neb in the office "worked for a week"  It is reasonable to try this though we don's have a lama in neb form so best bet is laba/ics trial if we can get it through medicare B   I had an extended discussion with the patient reviewing all relevant studies completed to date and  lasting 15 to 20 minutes of a 25 minute visit    Each maintenance medication was reviewed in detail including most importantly the difference between maintenance and prns and under what circumstances the prns are to be triggered using an action plan format that is not reflected in the computer generated alphabetically organized AVS.    Please see instructions for details which were reviewed in writing and the patient given a copy highlighting the part that I personally wrote and discussed at today's ov.

## 2016-06-26 DIAGNOSIS — I70245 Atherosclerosis of native arteries of left leg with ulceration of other part of foot: Secondary | ICD-10-CM | POA: Diagnosis not present

## 2016-07-10 DIAGNOSIS — I70245 Atherosclerosis of native arteries of left leg with ulceration of other part of foot: Secondary | ICD-10-CM | POA: Diagnosis not present

## 2016-07-24 ENCOUNTER — Encounter (HOSPITAL_BASED_OUTPATIENT_CLINIC_OR_DEPARTMENT_OTHER): Payer: Medicare Other | Attending: Surgery

## 2016-07-24 DIAGNOSIS — F17210 Nicotine dependence, cigarettes, uncomplicated: Secondary | ICD-10-CM | POA: Diagnosis not present

## 2016-07-24 DIAGNOSIS — L84 Corns and callosities: Secondary | ICD-10-CM | POA: Diagnosis not present

## 2016-07-24 DIAGNOSIS — J439 Emphysema, unspecified: Secondary | ICD-10-CM | POA: Insufficient documentation

## 2016-07-24 DIAGNOSIS — I70245 Atherosclerosis of native arteries of left leg with ulceration of other part of foot: Secondary | ICD-10-CM | POA: Insufficient documentation

## 2016-07-24 DIAGNOSIS — M86372 Chronic multifocal osteomyelitis, left ankle and foot: Secondary | ICD-10-CM | POA: Diagnosis not present

## 2016-07-24 DIAGNOSIS — Z95820 Peripheral vascular angioplasty status with implants and grafts: Secondary | ICD-10-CM | POA: Diagnosis not present

## 2016-07-24 DIAGNOSIS — I10 Essential (primary) hypertension: Secondary | ICD-10-CM | POA: Insufficient documentation

## 2016-07-24 DIAGNOSIS — L97521 Non-pressure chronic ulcer of other part of left foot limited to breakdown of skin: Secondary | ICD-10-CM | POA: Diagnosis not present

## 2016-07-30 ENCOUNTER — Ambulatory Visit (INDEPENDENT_AMBULATORY_CARE_PROVIDER_SITE_OTHER): Payer: Medicare Other | Admitting: Internal Medicine

## 2016-07-30 ENCOUNTER — Encounter: Payer: Self-pay | Admitting: Internal Medicine

## 2016-07-30 VITALS — BP 120/80 | HR 86 | Ht 67.0 in | Wt 120.0 lb

## 2016-07-30 DIAGNOSIS — J449 Chronic obstructive pulmonary disease, unspecified: Secondary | ICD-10-CM

## 2016-07-30 DIAGNOSIS — F17210 Nicotine dependence, cigarettes, uncomplicated: Secondary | ICD-10-CM | POA: Diagnosis not present

## 2016-07-30 NOTE — Patient Instructions (Addendum)
No change in medications  - if not doing well you will need to return with your medication formulary from insurance company  The key is to stop smoking completely before smoking completely stops you!   Please schedule a follow up visit in 6 months but call sooner if needed
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
## 2016-07-31 ENCOUNTER — Encounter: Payer: Self-pay | Admitting: Internal Medicine

## 2016-07-31 DIAGNOSIS — I70245 Atherosclerosis of native arteries of left leg with ulceration of other part of foot: Secondary | ICD-10-CM | POA: Diagnosis not present

## 2016-07-31 NOTE — Assessment & Plan Note (Signed)
>   3 min Discussed the risks and costs (both direct and indirect)  of smoking relative to the benefits of quitting but patient unwilling to commit at this point to a specific quit date.    Although I don't endorse regular use of e cigs/ many pts find them helpful; however, I emphasized they should be considered a "one-way bridge" off all tobacco products.  

## 2016-08-02 ENCOUNTER — Ambulatory Visit
Admission: RE | Admit: 2016-08-02 | Discharge: 2016-08-02 | Disposition: A | Discharge status: Home / Self Care | Payer: Medicare Other | Source: Ambulatory Visit | Attending: Surgery | Admitting: Surgery

## 2016-08-02 ENCOUNTER — Other Ambulatory Visit: Payer: Self-pay | Admitting: Surgery

## 2016-08-02 DIAGNOSIS — L97529 Non-pressure chronic ulcer of other part of left foot with unspecified severity: Secondary | ICD-10-CM

## 2016-08-07 DIAGNOSIS — I70245 Atherosclerosis of native arteries of left leg with ulceration of other part of foot: Secondary | ICD-10-CM | POA: Diagnosis not present

## 2016-08-08 ENCOUNTER — Ambulatory Visit: Payer: Medicare Other

## 2016-08-14 ENCOUNTER — Encounter: Payer: Self-pay | Admitting: Family Medicine

## 2016-08-14 ENCOUNTER — Ambulatory Visit (INDEPENDENT_AMBULATORY_CARE_PROVIDER_SITE_OTHER): Payer: Medicare Other | Admitting: Family Medicine

## 2016-08-14 VITALS — BP 118/83 | HR 69 | Temp 98.1°F | Resp 16 | Ht 67.0 in | Wt 120.1 lb

## 2016-08-14 DIAGNOSIS — E43 Unspecified severe protein-calorie malnutrition: Secondary | ICD-10-CM | POA: Diagnosis not present

## 2016-08-14 DIAGNOSIS — Z0000 Encounter for general adult medical examination without abnormal findings: Secondary | ICD-10-CM | POA: Insufficient documentation

## 2016-08-14 DIAGNOSIS — Z125 Encounter for screening for malignant neoplasm of prostate: Secondary | ICD-10-CM

## 2016-08-14 DIAGNOSIS — I739 Peripheral vascular disease, unspecified: Secondary | ICD-10-CM | POA: Diagnosis not present

## 2016-08-14 LAB — LIPID PANEL
CHOLESTEROL: 175 mg/dL (ref 0–200)
HDL: 75.8 mg/dL (ref >39.00)
LDL Cholesterol: 89 mg/dL (ref 0–99)
NONHDL: 99.57
Total CHOL/HDL Ratio: 2
Triglycerides: 54 mg/dL (ref 0.0–149.0)
VLDL: 10.8 mg/dL (ref 0.0–40.0)

## 2016-08-14 LAB — CBC WITH DIFFERENTIAL/PLATELET
BASOS ABS: 0.1 10*3/uL (ref 0.0–0.1)
BASOS PCT: 0.5 % (ref 0.0–3.0)
Eosinophils Absolute: 0.1 10*3/uL (ref 0.0–0.7)
Eosinophils Relative: 1.2 % (ref 0.0–5.0)
HEMATOCRIT: 44.3 % (ref 39.0–52.0)
Hemoglobin: 14.8 g/dL (ref 13.0–17.0)
LYMPHS ABS: 1.5 10*3/uL (ref 0.7–4.0)
Lymphocytes Relative: 14.1 % (ref 12.0–46.0)
MCHC: 33.4 g/dL (ref 30.0–36.0)
MCV: 96.3 fl (ref 78.0–100.0)
MONOS PCT: 6.5 % (ref 3.0–12.0)
Monocytes Absolute: 0.7 10*3/uL (ref 0.1–1.0)
NEUTROS ABS: 8.2 10*3/uL — AB (ref 1.4–7.7)
NEUTROS PCT: 77.7 % — AB (ref 43.0–77.0)
PLATELETS: 257 10*3/uL (ref 150.0–400.0)
RBC: 4.61 Mil/uL (ref 4.22–5.81)
RDW: 13.9 % (ref 11.5–15.5)
WBC: 10.6 10*3/uL — ABNORMAL HIGH (ref 4.0–10.5)

## 2016-08-14 LAB — BASIC METABOLIC PANEL
BUN: 15 mg/dL (ref 6–23)
CO2: 30 meq/L (ref 19–32)
Calcium: 9.3 mg/dL (ref 8.4–10.5)
Chloride: 102 mEq/L (ref 96–112)
Creatinine, Ser: 0.76 mg/dL (ref 0.40–1.50)
GFR: 107.72 mL/min (ref >60.00)
GLUCOSE: 110 mg/dL — AB (ref 70–99)
POTASSIUM: 4.7 meq/L (ref 3.5–5.1)
SODIUM: 139 meq/L (ref 135–145)

## 2016-08-14 LAB — HEPATIC FUNCTION PANEL
ALK PHOS: 56 U/L (ref 39–117)
ALT: 11 U/L (ref 0–53)
AST: 18 U/L (ref 0–37)
Albumin: 4.4 g/dL (ref 3.5–5.2)
BILIRUBIN DIRECT: 0.1 mg/dL (ref 0.0–0.3)
BILIRUBIN TOTAL: 0.4 mg/dL (ref 0.2–1.2)
TOTAL PROTEIN: 7.5 g/dL (ref 6.0–8.3)

## 2016-08-14 LAB — TSH: TSH: 0.64 u[IU]/mL (ref 0.35–4.50)

## 2016-08-14 LAB — PSA, MEDICARE: PSA: 1.29 ng/ml (ref 0.10–4.00)

## 2016-08-14 NOTE — Progress Notes (Signed)
Subjective:   Christopher GageDavid H Pena is a 70 y.o. male who presents for an Initial Medicare Annual Wellness Visit.  The Patient was informed that the wellness visit is to identify future health risk and educate and initiate measures that can reduce risk for increased disease through the lifespan.   Describes health as fair, good or great? "fair"  Review of Systems  No ROS.  Medicare Wellness Visit.  Cardiac Risk Factors include: advanced age (>[id number], 49>65 women);sedentary lifestyle;male gender   Sleep patterns: Sleeps fair, difficulty falling asleep. Typically goes to bed around 4-5 am. Discussed eating dinner earlier and minimizing use of electronics before bed.  Home Safety/Smoke Alarms: Smoke detectors in place.   Living environment; residence and Firearm Safety: Lives alone in first floor apartment Seat Belt Safety/Bike Helmet: Wears seat belt.    Counseling:   Eye Exam-Last exam > 5 years. Declines appointment.   Dental-Full dentures 40+ years. Discussed gum care and sore detection/prevention.   Male:   CCS-Declines colonoscopy. Cologuard ordered.      PSA-05/13/2016, 0.25     Objective:    Today's Vitals   08/14/16 1336  BP: 118/83  Pulse: 69  Resp: 16  Temp: 98.1 F (36.7 C)  TempSrc: Oral  SpO2: 98%  Weight: 120 lb 2 oz (54.5 kg)  Height: 5\' 7"  (1.702 m)   Body mass index is 18.81 kg/m.  Current Medications (verified) Outpatient Encounter Prescriptions as of 08/14/2016  Medication Sig  . aspirin EC 81 MG tablet Take 81 mg by mouth daily.  . budesonide (PULMICORT) 0.25 MG/2ML nebulizer solution Use one vial twice daily  . cetirizine (ZYRTEC) 10 MG tablet Take 1 tablet (10 mg total) by mouth daily.  . clopidogrel (PLAVIX) 75 MG tablet Take 1 tablet (75 mg total) by mouth daily.  Marland Kitchen. gabapentin (NEURONTIN) 300 MG capsule Take 1 capsule (300 mg total) by mouth 3 (three) times daily as needed (pain).  Marland Kitchen. oxyCODONE (ROXICODONE) 15 MG immediate release tablet Take 15 mg  by mouth every 4 (four) hours as needed for pain.  . [DISCONTINUED] formoterol (PERFOROMIST) 20 MCG/2ML nebulizer solution Take 2 mLs (20 mcg total) by nebulization 2 (two) times daily. Use in nebulizer twice daily perfectly regularly  . albuterol (PROAIR HFA) 108 (90 Base) MCG/ACT inhaler Inhale 2 puffs into the lungs every 4 (four) hours as needed for wheezing or shortness of breath. (Patient not taking: Reported on 08/14/2016)   No facility-administered encounter medications on file as of 08/14/2016.     Allergies (verified) Clindamycin/lincomycin   History: Past Medical History:  Diagnosis Date  . Anxiety   . Chronic low back pain    "degenerative spine dx'd ~ 7 yr ago" (06/08/2013)  . Claudication (HCC)   . COPD (chronic obstructive pulmonary disease) (HCC)   . Emphysema of lung (HCC)   . Hypertension    "moderately high; RX didn't help" (06/08/2013)  . Neuromuscular disorder (HCC)    degen spine  . Non-healing wound of lower extremity 06/09/2013  . PAD (peripheral artery disease), PTA/stent IDEV & chocolate baloon 06/08/13 04/13/2013   Severely reduced ABI's of 0.3 bilaterally   . Shortness of breath    w/ exertion  . Tobacco abuse   . Tobacco use 06/09/2013  . Unexplained weight loss    Past Surgical History:  Procedure Laterality Date  . ABDOMINAL AORTAGRAM  05/10/2013   Procedure: ABDOMINAL Ronny FlurryAORTAGRAM;  Surgeon: Runell GessJonathan J Berry, MD;  [facility];  Service: Cardiovascular;;  .  ABI     04/09/13; abnormal  . ANGIOPLASTY Left 05/21/2016   Procedure: ballon ANGIOPLASTY of femoral to anterior to tibial bypass graft;  Surgeon: Nada Libman, MD;  Location: [facility] OR;  Service: Vascular;  Laterality: Left;  . APPENDECTOMY  05/2001  . FEMORAL ARTERY STENT Right 05/10/13   2 stents Rt SFA  . FEMORAL ARTERY STENT Left 06/08/2013  . FEMORAL-POPLITEAL BYPASS GRAFT Right 11/12/2013   Procedure: RIGHT  FEMORAL-BELOW KNEE POPLITEAL ARTERY BYPASS GRAFT USING NON- REVERSE RIGHT  GREATER SAPHENOUS VEIN.;  Surgeon: Nada Libman, MD;  Location: [facility] OR;  Service: Vascular;  Laterality: Right;  . FEMORAL-TIBIAL BYPASS GRAFT Left 12/08/2015   Procedure:  LEFT FEMORAL-ANTERIOR TIBIAL ARTERY BYPASS GRAFT;  Surgeon: Nada Libman, MD;  Location: [facility] OR;  Service: Vascular;  Laterality: Left;  . LOWER EXTREMITY ANGIOGRAM Bilateral 05/10/2013   Procedure: LOWER EXTREMITY ANGIOGRAM;  Surgeon: Runell Gess, MD;  Location: [facility] CATH LAB;  Service: Cardiovascular;  Laterality: Bilateral;  . LOWER EXTREMITY ANGIOGRAM N/A 06/08/2013   Procedure: LOWER EXTREMITY ANGIOGRAM;  Surgeon: Runell Gess, MD;  Location: [facility] CATH LAB;  Service: Cardiovascular;  Laterality: N/A;  . LOWER EXTREMITY ANGIOGRAM N/A 10/28/2013   Procedure: LOWER EXTREMITY ANGIOGRAM;  Surgeon: Runell Gess, MD;  Location: [facility] CATH LAB;  Service: Cardiovascular;  Laterality: N/A;  . LOWER EXTREMITY ANGIOGRAM N/A 05/16/2014   Procedure: LOWER EXTREMITY ANGIOGRAM;  Surgeon: Runell Gess, MD;  Location[facility] CATH LAB;  Service: Cardiovascular;  Laterality: N/A;  . PERCUTANEOUS STENT INTERVENTION Right 05/10/2013   Procedure: PERCUTANEOUS STENT INTERVENTION;  Surgeon: Runell Gess, MD;  Location: [facility] CATH LAB;  Service: Cardiovascular;  Laterality: Right;  rt sfa and popliteal stent x2  . PERIPHERAL VASCULAR CATHETERIZATION N/A 12/06/2015   Procedure: Abdominal Aortogram w/Lower Extremity;  Surgeon: Nada Libman, MD;  Location: [facility] INVASIVE CV LAB;  Service: Cardiovascular;  Laterality: N/A;  . PERIPHERAL VASCULAR CATHETERIZATION N/A 05/22/2016   Procedure: Abdominal Aortogram w/Lower Extremity;  Surgeon: Maeola Harman, MD;  Location: [facility] INVASIVE CV LAB;  Service: Cardiovascular;  Laterality: N/A;  . THROMBECTOMY FEMORAL ARTERY Left 05/21/2016   Procedure: THROMBECTOMY FEMORAL TO ANTERIOR TIBIAL BYPASS GRAFT;  Surgeon: Nada Libman, MD;  Location: [facility] OR;  Service: Vascular;  Laterality: Left;  Marland Kitchen VEIN  HARVEST Left 12/08/2015   Procedure: USING NON REVERSE  LEFT GREATER SAPHENOUS VEIN;  Surgeon: Nada Libman, MD;  Location: [facility] OR;  Service: Vascular;  Laterality: Left;   Family History  Problem Relation Age of Onset  . Hyperlipidemia Mother   . Heart disease Mother   . Diabetes Mother   . Cancer Father     Liver   Social History   Occupational History  . former golf pro    Social History Main Topics  . Smoking status: Light Tobacco Smoker    Packs/day: 0.25    Years: 50.00    Types: Cigarettes    Start date: 07/03/1963  . Smokeless tobacco: Never Used     Comment: states "he is ready to quit"  . Alcohol use No     Comment: 06/08/2013 "quit 02/2007; drank my share before that though"  . Drug use: No  . Sexual activity: Yes   Tobacco Counseling Ready to quit: No Counseling given: Yes   Activities of Daily Living In your present state of health, do you have any difficulty performing the following activities: 08/14/2016 08/14/2016  Hearing? N N  Vision? N N  Difficulty concentrating or making decisions? N N  Walking or climbing stairs? N N  Dressing or bathing? N N  Doing errands, shopping? N N  Preparing Food and eating ? N N  Using the Toilet? N N  In the past six months, have you accidently leaked urine? N N  Do you have problems with loss of bowel control? N N  Managing your Medications? N N  Managing your Finances? N N  Housekeeping or managing your Housekeeping? N N  Some recent data might be hidden    Immunizations and Health Maintenance Immunization History  Administered Date(s) Administered  . Influenza Whole 06/10/2016  . Influenza, High Dose Seasonal PF 05/11/2014  . Influenza-Unspecified 05/19/2013  . Pneumococcal Conjugate-13 05/13/2016   There are no preventive care reminders to display for this patient.  Patient Care Team: Sheliah Hatch, MD as PCP - General (Family Medicine) Runell Gess, MD as Consulting Physician  (Cardiology) Lenn Sink, DPM as Consulting Physician (Podiatry) [facility] (Pain Medicine) Nada Libman, MD as Consulting Physician (Vascular Surgery) Nyoka Cowden, MD as Consulting Physician (Pulmonary Disease) Evlyn Kanner, MD as Consulting Physician (Surgery)  Indicate any recent Medical Services you may have received from other than [facility] providers in the past year (date may be approximate).    Assessment:   This is a routine wellness examination for Christopher Pena. Physical assessment deferred to PCP.   Hearing/Vision screen Hearing Screening Comments: Able to hear conversational tones w/o difficulty. No issues reported.   Vision Screening Comments: Wears reading glasses.   Dietary issues and exercise activities discussed: Current Exercise Habits: The patient does not participate in regular exercise at present, Exercise limited by: respiratory conditions(s) (leg pain)   Diet (meal preparation, eat out, water intake, caffeinated beverages, dairy products, fruits and vegetables): Eats 1-2 meals/day. No appetite/energy. Drinks water.   Typically skips Breakfast and Lunch. States his pain medications do not work as well when he eats, but does eat when he's hungry.  Dinner: sandwiches, canned soup, boiled eggs and ensure.     Discussed eating small meals throughout the day, having Ensure in the am. Encouraged to be as active as possible (back pain and toe/leg pain).   Goals      Patient Stated   . <enter goal here> (pt-stated)          "breathe better" by seeing pulmonologist. Ultimate goal to move back to [place].       Depression Screen PHQ 2/9 Scores 08/14/2016 08/14/2016 04/08/2016  PHQ - 2 Score 0 0 0  PHQ- 9 Score - 0 -    Fall Risk Fall Risk  08/14/2016 08/14/2016 04/08/2016  Falls in the past year? No No No    Cognitive Function:       Ad8 score reviewed for issues:  Issues making decisions:no  Less interest in hobbies /  activities:nono  Repeats questions, stories (family complaining):no  Trouble using ordinary gadgets (microwave, computer, phone):no  Forgets the month or year: no  Mismanaging finances: no  Remembering appts:no  Daily problems with thinking and/or memory:no Ad8 score is= 0     Screening Tests Health Maintenance  Topic Date Due  . COLONOSCOPY  05/19/2017 (Originally 06/15/1996)  . ZOSTAVAX  05/19/2017 (Originally 06/15/2006)  . TETANUS/TDAP  05/19/2017 (Originally 06/15/1965)  . Hepatitis C Screening  05/19/2017 (Originally [birth year])  . PNA vac Low Risk Adult (2 of 2 - PPSV23) 05/13/2017  . INFLUENZA VACCINE  Completed  Plan:     Try to eat heart healthy diet (full of fruits, vegetables, whole grains, lean protein, water--limit salt, fat, and sugar intake) and increase physical activity as tolerated.  Continue doing brain stimulating activities (puzzles, reading, adult coloring books, staying active) to keep memory sharp.   Bring a copy of your advance directives to your next office visit.  Complete cologuard testing.   During the course of the visit Onalee HuaDavid was educated and counseled about the following appropriate screening and preventive services:   Vaccines to include Pneumoccal, Influenza, Hepatitis B, Td, Zostavax, HCV  Electrocardiogram  Colorectal cancer screening  Cardiovascular disease screening  Diabetes screening  Glaucoma screening  Nutrition counseling  Prostate cancer screening   Patient Instructions (the written plan) were given to the patient.   Alysia PennaKimberly R Chiquetta Langner, RN   08/14/2016

## 2016-08-14 NOTE — Progress Notes (Signed)
   Subjective:    Patient ID: Christopher Pena, male    DOB: 1945-12-29, 70 y.o.   MRN: [id number]  HPI CPE- due for colonoscopy but he refuses.  Is not willing to do Cologuard at this time either.  UTD on flu and Prevnar.  Due for Pneumovax next year.  Is seeing Dr Sherene SiresWert for his COPD and Dr Myra GianottiBrabham for his PAD/PVD.  Currently has a L great toe ulcer and seeing Dr Desmond LopeBrittow at wound care  [facility]- pt had wellness visit w/ RN, documentation reviewed and agree.  Review of Systems Patient reports no vision/hearing changes, anorexia, fever ,adenopathy, persistant/recurrent hoarseness, swallowing issues, chest pain, palpitations, edema, hemoptysis, gastrointestinal  bleeding (melena, rectal bleeding), abdominal pain, excessive heart burn, GU symptoms (dysuria, hematuria, voiding/incontinence issues) syncope, focal weakness, memory loss, numbness & tingling, skin/hair/nail changes, depression, anxiety, abnormal bruising/bleeding, musculoskeletal symptoms/signs.   + chronic SOB and cough    Objective:   Physical Exam General Appearance:    Alert, cooperative, no distress, appears older than stated age  Head:    Normocephalic, without obvious abnormality, atraumatic  Eyes:    PERRL, conjunctiva/corneas clear, EOM's intact, fundi    benign, both eyes       Ears:    Normal TM's and external ear canals, both ears  Nose:   Nares normal, septum midline, mucosa normal, no drainage   or sinus tenderness  Throat:   Lips, mucosa, and tongue normal; teeth and gums normal  Neck:   Supple, symmetrical, trachea midline, no adenopathy;       thyroid:  No enlargement/tenderness/nodules  Back:     Symmetric, no curvature, ROM normal, no CVA tenderness  Lungs:     Clear to auscultation bilaterally, respirations unlabored  Chest wall:    No tenderness or deformity  Heart:    Regular rate and rhythm, S1 and S2 normal, no murmur, rub   or gallop  Abdomen:     Soft, non-tender, bowel sounds active all four quadrants,   no masses, no organomegaly  Genitalia:    Deferred at pt's request  Rectal:    Extremities:   Extremities normal, atraumatic, no cyanosis or edema  Pulses:   2+ and symmetric all extremities  Skin:   Skin color, texture, turgor normal, no rashes or lesions  Lymph nodes:   Cervical, supraclavicular, and axillary nodes normal  Neurologic:   CNII-XII intact. Normal strength, sensation and reflexes      throughout          Assessment & Plan:

## 2016-08-14 NOTE — Assessment & Plan Note (Signed)
Ongoing issue for pt.  Is is regaining some of the weight lost.  Check labs.  Replete electrolytes or tx underlying thyroid abnormalities if present.  Pt expressed understanding and is in agreement w/ plan.

## 2016-08-14 NOTE — Assessment & Plan Note (Signed)
Chronic problem, following w/ vascular and wound care for lesion on L great toe.  Goal is risk reduction w/ lipid and BP control.  Check labs.  Start meds prn.

## 2016-08-14 NOTE — Progress Notes (Signed)
Pre visit review using our clinic review tool, if applicable. No additional management support is needed unless otherwise documented below in the visit note. 

## 2016-08-14 NOTE — Patient Instructions (Addendum)
Follow up in 6 months to recheck BP We'll notify you of your lab results and make any changes if needed Continue to eat regularly to prevent any additional weight loss- you can do it! Please consider doing the Cologuard as a colon cancer screening test You are up to date on immunizations- yay!!! Call with any questions or concerns Happy New Year!!!  Try to eat heart healthy diet (full of fruits, vegetables, whole grains, lean protein, water--limit salt, fat, and sugar intake) and increase physical activity as tolerated.  Continue doing brain stimulating activities (puzzles, reading, adult coloring books, staying active) to keep memory sharp.   Bring a copy of your advance directives to your next office visit.  Complete cologuard testing.    Fall Prevention in the Home Introduction Falls can cause injuries. They can happen to people of all ages. There are many things you can do to make your home safe and to help prevent falls. What can I do on the outside of my home?  Regularly fix the edges of walkways and driveways and fix any cracks.  Remove anything that might make you trip as you walk through a door, such as a raised step or threshold.  Trim any bushes or trees on the path to your home.  Use bright outdoor lighting.  Clear any walking paths of anything that might make someone trip, such as rocks or tools.  Regularly check to see if handrails are loose or broken. Make sure that both sides of any steps have handrails.  Any raised decks and porches should have guardrails on the edges.  Have any leaves, snow, or ice cleared regularly.  Use sand or salt on walking paths during winter.  Clean up any spills in your garage right away. This includes oil or grease spills. What can I do in the bathroom?  Use night lights.  Install grab bars by the toilet and in the tub and shower. Do not use towel bars as grab bars.  Use non-skid mats or decals in the tub or shower.  If you  need to sit down in the shower, use a plastic, non-slip stool.  Keep the floor dry. Clean up any water that spills on the floor as soon as it happens.  Remove soap buildup in the tub or shower regularly.  Attach bath mats securely with double-sided non-slip rug tape.  Do not have throw rugs and other things on the floor that can make you trip. What can I do in the bedroom?  Use night lights.  Make sure that you have a light by your bed that is easy to reach.  Do not use any sheets or blankets that are too big for your bed. They should not hang down onto the floor.  Have a firm chair that has side arms. You can use this for support while you get dressed.  Do not have throw rugs and other things on the floor that can make you trip. What can I do in the kitchen?  Clean up any spills right away.  Avoid walking on wet floors.  Keep items that you use a lot in easy-to-reach places.  If you need to reach something above you, use a strong step stool that has a grab bar.  Keep electrical cords out of the way.  Do not use floor polish or wax that makes floors slippery. If you must use wax, use non-skid floor wax.  Do not have throw rugs and other things on the  floor that can make you trip. What can I do with my stairs?  Do not leave any items on the stairs.  Make sure that there are handrails on both sides of the stairs and use them. Fix handrails that are broken or loose. Make sure that handrails are as long as the stairways.  Check any carpeting to make sure that it is firmly attached to the stairs. Fix any carpet that is loose or worn.  Avoid having throw rugs at the top or bottom of the stairs. If you do have throw rugs, attach them to the floor with carpet tape.  Make sure that you have a light switch at the top of the stairs and the bottom of the stairs. If you do not have them, ask someone to add them for you. What else can I do to help prevent falls?  Wear shoes  that:  Do not have high heels.  Have rubber bottoms.  Are comfortable and fit you well.  Are closed at the toe. Do not wear sandals.  If you use a stepladder:  Make sure that it is fully opened. Do not climb a closed stepladder.  Make sure that both sides of the stepladder are locked into place.  Ask someone to hold it for you, if possible.  Clearly mark and make sure that you can see:  Any grab bars or handrails.  First and last steps.  Where the edge of each step is.  Use tools that help you move around (mobility aids) if they are needed. These include:  Canes.  Walkers.  Scooters.  Crutches.  Turn on the lights when you go into a dark area. Replace any light bulbs as soon as they burn out.  Set up your furniture so you have a clear path. Avoid moving your furniture around.  If any of your floors are uneven, fix them.  If there are any pets around you, be aware of where they are.  Review your medicines with your doctor. Some medicines can make you feel dizzy. This can increase your chance of falling. Ask your doctor what other things that you can do to help prevent falls. This information is not intended to replace advice given to you by your health care provider. Make sure you discuss any questions you have with your health care provider. Document Released: 06/01/2009 Document Revised: 01/11/2016 Document Reviewed: 09/09/2014  2017 Elsevier  Health Maintenance, Male A healthy lifestyle and preventative care can promote health and wellness.  Maintain regular health, dental, and eye exams.  Eat a healthy diet. Foods like vegetables, fruits, whole grains, low-fat dairy products, and lean protein foods contain the nutrients you need and are low in calories. Decrease your intake of foods high in solid fats, added sugars, and salt. Get information about a proper diet from your health care provider, if necessary.  Regular physical exercise is one of the most  important things you can do for your health. Most adults should get at least 150 minutes of moderate-intensity exercise (any activity that increases your heart rate and causes you to sweat) each week. In addition, most adults need muscle-strengthening exercises on 2 or more days a week.   Maintain a healthy weight. The body mass index (BMI) is a screening tool to identify possible weight problems. It provides an estimate of body fat based on height and weight. Your health care provider can find your BMI and can help you achieve or maintain a healthy weight. For males  20 years and older:  A BMI below 18.5 is considered underweight.  A BMI of 18.5 to 24.9 is normal.  A BMI of 25 to 29.9 is considered overweight.  A BMI of 30 and above is considered obese.  Maintain normal blood lipids and cholesterol by exercising and minimizing your intake of saturated fat. Eat a balanced diet with plenty of fruits and vegetables. Blood tests for lipids and cholesterol should begin at age 70 and be repeated every 5 years. If your lipid or cholesterol levels are high, you are over age 70, or you are at high risk for heart disease, you may need your cholesterol levels checked more frequently.Ongoing high lipid and cholesterol levels should be treated with medicines if diet and exercise are not working.  If you smoke, find out from your health care provider how to quit. If you do not use tobacco, do not start.  Lung cancer screening is recommended for adults aged 55-80 years who are at high risk for developing lung cancer because of a history of smoking. A yearly low-dose CT scan of the lungs is recommended for people who have at least a 30-pack-year history of smoking and are current smokers or have quit within the past 15 years. A pack year of smoking is smoking an average of 1 pack of cigarettes a day for 1 year (for example, a 30-pack-year history of smoking could mean smoking 1 pack a day for 30 years or 2 packs a  day for 15 years). Yearly screening should continue until the smoker has stopped smoking for at least 15 years. Yearly screening should be stopped for people who develop a health problem that would prevent them from having lung cancer treatment.  If you choose to drink alcohol, do not have more than 2 drinks per day. One drink is considered to be 12 oz (360 mL) of beer, 5 oz (150 mL) of wine, or 1.5 oz (45 mL) of liquor.  Avoid the use of street drugs. Do not share needles with anyone. Ask for help if you need support or instructions about stopping the use of drugs.  High blood pressure causes heart disease and increases the risk of stroke. High blood pressure is more likely to develop in:  People who have blood pressure in the end of the normal range (100-139/85-89 mm Hg).  People who are overweight or obese.  People who are African American.  If you are 6518-70 years of age, have your blood pressure checked every 3-5 years. If you are 70 years of age or older, have your blood pressure checked every year. You should have your blood pressure measured twice-once when you are at a hospital or clinic, and once when you are not at a hospital or clinic. Record the average of the two measurements. To check your blood pressure when you are not at a hospital or clinic, you can use:  An automated blood pressure machine at a pharmacy.  A home blood pressure monitor.  If you are 4745-70 years old, ask your health care provider if you should take aspirin to prevent heart disease.  Diabetes screening involves taking a blood sample to check your fasting blood sugar level. This should be done once every 3 years after age 70 if you are at a normal weight and without risk factors for diabetes. Testing should be considered at a younger age or be carried out more frequently if you are overweight and have at least 1 risk factor for diabetes.  Colorectal cancer can be detected and often prevented. Most routine  colorectal cancer screening begins at the age of 70 and continues through age 70. However, your health care provider may recommend screening at an earlier age if you have risk factors for colon cancer. On a yearly basis, your health care provider may provide home test kits to check for hidden blood in the stool. A small camera at the end of a tube may be used to directly examine the colon (sigmoidoscopy or colonoscopy) to detect the earliest forms of colorectal cancer. Talk to your health care provider about this at age 70 when routine screening begins. A direct exam of the colon should be repeated every 5-10 years through age 70, unless early forms of precancerous polyps or small growths are found.  People who are at an increased risk for hepatitis B should be screened for this virus. You are considered at high risk for hepatitis B if:  You were born in a country where hepatitis B occurs often. Talk with your health care provider about which countries are considered high risk.  Your parents were born in a high-risk country and you have not received a shot to protect against hepatitis B (hepatitis B vaccine).  You have HIV or AIDS.  You use needles to inject street drugs.  You live with, or have sex with, someone who has hepatitis B.  You are a man who has sex with other men (MSM).  You get hemodialysis treatment.  You take certain medicines for conditions like cancer, organ transplantation, and autoimmune conditions.  Hepatitis C blood testing is recommended for all people born from [id number] through 1965 and any individual with known risk factors for hepatitis C.  Healthy men should no longer receive prostate-specific antigen (PSA) blood tests as part of routine cancer screening. Talk to your health care provider about prostate cancer screening.  Testicular cancer screening is not recommended for adolescents or adult males who have no symptoms. Screening includes self-exam, a health care  provider exam, and other screening tests. Consult with your health care provider about any symptoms you have or any concerns you have about testicular cancer.  Practice safe sex. Use condoms and avoid high-risk sexual practices to reduce the spread of sexually transmitted infections (STIs).  You should be screened for STIs, including gonorrhea and chlamydia if:  You are sexually active and are younger than 24 years.  You are older than 24 years, and your health care provider tells you that you are at risk for this type of infection.  Your sexual activity has changed since you were last screened, and you are at an increased risk for chlamydia or gonorrhea. Ask your health care provider if you are at risk.  If you are at risk of being infected with HIV, it is recommended that you take a prescription medicine daily to prevent HIV infection. This is called pre-exposure prophylaxis (PrEP). You are considered at risk if:  You are a man who has sex with other men (MSM).  You are a heterosexual man who is sexually active with multiple partners.  You take drugs by injection.  You are sexually active with a partner who has HIV.  Talk with your health care provider about whether you are at high risk of being infected with HIV. If you choose to begin PrEP, you should first be tested for HIV. You should then be tested every 3 months for as long as you are taking PrEP.  Use sunscreen. Apply  sunscreen liberally and repeatedly throughout the day. You should seek shade when your shadow is shorter than you. Protect yourself by wearing long sleeves, pants, a wide-brimmed hat, and sunglasses year round whenever you are outdoors.  Tell your health care provider of new moles or changes in moles, especially if there is a change in shape or color. Also, tell your health care provider if a mole is larger than the size of a pencil eraser.  A one-time screening for abdominal aortic aneurysm (AAA) and surgical  repair of large AAAs by ultrasound is recommended for men aged 65-75 years who are current or former smokers.  Stay current with your vaccines (immunizations). This information is not intended to replace advice given to you by your health care provider. Make sure you discuss any questions you have with your health care provider. Document Released: 02/01/2008 Document Revised: 08/26/2014 Document Reviewed: 05/09/2015 Elsevier Interactive Patient Education  2017 [employer].

## 2016-08-14 NOTE — Assessment & Plan Note (Signed)
PE unchanged from previous.  UTD on immunizations.  Declines colonoscopy, wants to think about cologuard.  Written screening schedule updated and given to pt.  Check labs.  Anticipatory guidance provided.

## 2016-08-15 ENCOUNTER — Encounter: Payer: Self-pay | Admitting: General Practice

## 2016-08-29 ENCOUNTER — Encounter: Payer: Self-pay | Admitting: Surgery

## 2016-09-02 ENCOUNTER — Other Ambulatory Visit: Payer: Self-pay

## 2016-09-02 ENCOUNTER — Encounter: Payer: Self-pay | Admitting: Surgery

## 2016-09-02 ENCOUNTER — Ambulatory Visit (INDEPENDENT_AMBULATORY_CARE_PROVIDER_SITE_OTHER): Payer: Medicare Other | Admitting: Surgery

## 2016-09-02 VITALS — BP 130/74 | HR 75 | Temp 97.9°F | Resp 18 | Ht 67.0 in | Wt 123.0 lb

## 2016-09-02 DIAGNOSIS — I70445 Atherosclerosis of autologous vein bypass graft(s) of the left leg with ulceration of other part of foot: Secondary | ICD-10-CM | POA: Diagnosis not present

## 2016-09-02 NOTE — Progress Notes (Signed)
[facility]  Patient name: Christopher GageDavid H Pena MRN: [id number] DOB: Jun 02, 1946 Sex: male  REASON FOR VISIT: follow up  HPI: Christopher BalesDavid H Pena a 71 y.o.malewho is status post left femoral to anterior tibial bypass graft on 12/10/2015. This was done for a wound on his left great toe. He has a history of a right femoral to below-knee popliteal bypass graft with vein which has occluded. He was last seen on 04/15/2016 and his bypass graft was patent.  On 05/20/2016 he came back in the office without a pulse in his bypass graft.  He stated that have been going on for approximately 3 weeks.  He had also developed a wound on his great toe.  On 05/21/2016 he was taken to the operating room for a thrombectomy of his superficial femoral anterior tibial bypass graft.  I was able to get the bypass graft open, however the vein appeared to be sclerotic.  I performed angioplasty blindly in the operating room.  The following day he continued to have a pulse within the graft and therefore I had him taken to the Angio suite for arteriogram and additional balloon angioplasty with a 4 mm balloon.  He was discharged home on Plavix.  He has developed worsening pain in his left foot and a shorter distance claudication over the past 5 days.  He now has an ulcer on the first and third toe which have been present for several months but look a little worse.  The pain in his foot is also worse on the left.  Past Medical History:  Diagnosis Date  . Anxiety   . Chronic low back pain    "degenerative spine dx'd ~ 7 yr ago" (06/08/2013)  . Claudication (HCC)   . COPD (chronic obstructive pulmonary disease) (HCC)   . Emphysema of lung (HCC)   . Hypertension    "moderately high; RX didn't help" (06/08/2013)  . Neuromuscular disorder (HCC)    degen spine  . Non-healing wound of lower extremity 06/09/2013  . PAD (peripheral artery disease), PTA/stent IDEV &  chocolate baloon 06/08/13 04/13/2013   Severely reduced ABI's of 0.3 bilaterally   . Shortness of breath    w/ exertion  . Tobacco abuse   . Tobacco use 06/09/2013  . Unexplained weight loss     Family History  Problem Relation Age of Onset  . Hyperlipidemia Mother   . Heart disease Mother   . Diabetes Mother   . Cancer Father     Liver    SOCIAL HISTORY: Social History  Substance Use Topics  . Smoking status: Light Tobacco Smoker    Packs/day: 0.25    Years: 50.00    Types: Cigarettes    Start date: 07/03/1963  . Smokeless tobacco: Never Used     Comment: states "he is ready to quit"  . Alcohol use No     Comment: 06/08/2013 "quit 02/2007; drank my share before that though"    Allergies  Allergen Reactions  . Clindamycin/Lincomycin Diarrhea    Current Outpatient Prescriptions  Medication Sig Dispense Refill  . aspirin EC 81 MG tablet Take 81 mg by mouth daily.    . budesonide (PULMICORT) 0.25 MG/2ML nebulizer solution Use one vial twice daily 120 mL 12  . cetirizine (ZYRTEC) 10 MG tablet Take 1 tablet (10 mg total) by mouth daily. 30 tablet 11  . clopidogrel (PLAVIX) 75 MG tablet Take 1 tablet (75 mg total) by mouth daily. 30 tablet 11  .  gabapentin (NEURONTIN) 300 MG capsule Take 1 capsule (300 mg total) by mouth 3 (three) times daily as needed (pain).    Marland Kitchen oxyCODONE (ROXICODONE) 15 MG immediate release tablet Take 15 mg by mouth every 4 (four) hours as needed for pain.    Marland Kitchen albuterol (PROAIR HFA) 108 (90 Base) MCG/ACT inhaler Inhale 2 puffs into the lungs every 4 (four) hours as needed for wheezing or shortness of breath. (Patient not taking: Reported on 09/02/2016) 1 Inhaler 6   No current facility-administered medications for this visit.     REVIEW OF SYSTEMS:  [X]  denotes positive finding, [ ]  denotes negative finding Cardiac  Comments:  Chest pain or chest pressure:    Shortness of breath upon exertion:    Short of breath when lying flat:    Irregular heart  rhythm:        Vascular    Pain in calf, thigh, or hip brought on by ambulation: x   Pain in feet at night that wakes you up from your sleep:     Blood clot in your veins:    Leg swelling:         Pulmonary    Oxygen at home:    Productive cough:     Wheezing:         Neurologic    Sudden weakness in arms or legs:     Sudden numbness in arms or legs:     Sudden onset of difficulty speaking or slurred speech:    Temporary loss of vision in one eye:     Problems with dizziness:         Gastrointestinal    Blood in stool:     Vomited blood:         Genitourinary    Burning when urinating:     Blood in urine:        Psychiatric    Major depression:         Hematologic    Bleeding problems:    Problems with blood clotting too easily:        Skin    Rashes or ulcers: X       Constitutional    Fever or chills:      PHYSICAL EXAM: Vitals:   09/02/16 1110  BP: 130/74  Pulse: 75  Resp: 18  Temp: 97.9 F (36.6 C)  TempSrc: Oral  SpO2: 98%  Weight: 123 lb (55.8 kg)  Height: 5\' 7"  (1.702 m)    GENERAL: The patient is a well-nourished male, in no acute distress. The vital signs are documented above. CARDIAC: There is a regular rate and rhythm.  VASCULAR: Palpable left femoral anterior tibial graft pulse. PULMONARY: Non-labored respirations ABDOMEN: Soft and non-tender with normal pitched bowel sounds.  MUSCULOSKELETAL: There are no major deformities or cyanosis. NEUROLOGIC: No focal weakness or paresthesias are detected. SKIN: Chronic ulcers to the left great and third toe PSYCHIATRIC: The patient has a normal affect.  DATA:  None  MEDICAL ISSUES: I am concerned that the patient has developed progressive changes within his bypass graft or within his posterior tibial artery distally.  This is evidenced by the fact that he has developed a shorter distance claudication over the past 5 days as well as a bluish discoloration in his foot.  I have recommended that we  get angiography done in the next day or 2, however he states that his nurse her size that he wants to wait at least until next week.  I have scheduled for Tuesday, January 23.  I will cannulate his right groin perform aortogram with bilateral runoff and intervene on the left leg.  I told him I would be very aggressive about dilating his bypass graft as he had neointimal hyperplasia but did respond well initially but most likely has had some recoil.  I would also be aggressive about intervening on his anterior tibial artery below the bypass graft as this would be for limb salvage.  He does know that if he no longer feels a pulse in his bypass graft that he needs to be evaluated immediately    Durene Cal, MD [facility] [phone] Pager [phone]

## 2016-09-10 ENCOUNTER — Ambulatory Visit (HOSPITAL_COMMUNITY)
Admission: RE | Admit: 2016-09-10 | Discharge: 2016-09-10 | Disposition: A | Discharge status: Home / Self Care | Payer: Medicare Other | Source: Ambulatory Visit | Attending: Surgery | Admitting: Surgery

## 2016-09-10 ENCOUNTER — Encounter (HOSPITAL_COMMUNITY)
Admission: RE | Disposition: A | Discharge status: Home / Self Care | Payer: Self-pay | Source: Ambulatory Visit | Attending: Surgery

## 2016-09-10 DIAGNOSIS — Z539 Procedure and treatment not carried out, unspecified reason: Secondary | ICD-10-CM | POA: Diagnosis not present

## 2016-09-10 DIAGNOSIS — I70213 Atherosclerosis of native arteries of extremities with intermittent claudication, bilateral legs: Secondary | ICD-10-CM | POA: Insufficient documentation

## 2016-09-10 LAB — POCT I-STAT, CHEM 8
BUN: 19 mg/dL (ref 6–20)
CALCIUM ION: 1.17 mmol/L (ref 1.15–1.40)
CHLORIDE: 104 mmol/L (ref 101–111)
CREATININE: 0.8 mg/dL (ref 0.61–1.24)
GLUCOSE: 106 mg/dL — AB (ref 65–99)
HCT: 43 % (ref 39.0–52.0)
HEMOGLOBIN: 14.6 g/dL (ref 13.0–17.0)
POTASSIUM: 3.6 mmol/L (ref 3.5–5.1)
Sodium: 142 mmol/L (ref 135–145)
TCO2: 25 mmol/L (ref 0–100)

## 2016-09-10 SURGERY — ABDOMINAL AORTOGRAM W/LOWER EXTREMITY

## 2016-09-10 MED ORDER — SODIUM CHLORIDE 0.9 % IV SOLN
INTRAVENOUS | Status: DC
  Administered 2016-09-10: 11:00:00 via INTRAVENOUS

## 2016-09-10 NOTE — Progress Notes (Signed)
Dr Myra GianottiBrabham spoke with pt/ pt has decided to reschedule procedure.

## 2016-09-10 NOTE — Discharge Instructions (Signed)
See not/ dc pt

## 2016-09-11 ENCOUNTER — Other Ambulatory Visit: Payer: Self-pay | Admitting: Surgery

## 2016-09-11 ENCOUNTER — Other Ambulatory Visit: Payer: Self-pay | Admitting: *Deleted

## 2016-09-11 DIAGNOSIS — L97509 Non-pressure chronic ulcer of other part of unspecified foot with unspecified severity: Secondary | ICD-10-CM

## 2016-09-16 ENCOUNTER — Ambulatory Visit: Payer: Medicare Other | Admitting: Surgery

## 2016-09-17 ENCOUNTER — Encounter (HOSPITAL_COMMUNITY)
Admission: RE | Disposition: A | Discharge status: Home / Self Care | Payer: Self-pay | Source: Ambulatory Visit | Attending: Surgery

## 2016-09-17 ENCOUNTER — Other Ambulatory Visit: Payer: Self-pay

## 2016-09-17 ENCOUNTER — Ambulatory Visit (HOSPITAL_COMMUNITY)
Admission: RE | Admit: 2016-09-17 | Discharge: 2016-09-17 | Disposition: A | Discharge status: Home / Self Care | Payer: Medicare Other | Source: Ambulatory Visit | Attending: Surgery | Admitting: Surgery

## 2016-09-17 DIAGNOSIS — Z7902 Long term (current) use of antithrombotics/antiplatelets: Secondary | ICD-10-CM

## 2016-09-17 DIAGNOSIS — L97529 Non-pressure chronic ulcer of other part of left foot with unspecified severity: Secondary | ICD-10-CM | POA: Insufficient documentation

## 2016-09-17 DIAGNOSIS — Z808 Family history of malignant neoplasm of other organs or systems: Secondary | ICD-10-CM

## 2016-09-17 DIAGNOSIS — G709 Myoneural disorder, unspecified: Secondary | ICD-10-CM | POA: Insufficient documentation

## 2016-09-17 DIAGNOSIS — Z95820 Peripheral vascular angioplasty status with implants and grafts: Secondary | ICD-10-CM

## 2016-09-17 DIAGNOSIS — M545 Low back pain: Secondary | ICD-10-CM | POA: Insufficient documentation

## 2016-09-17 DIAGNOSIS — Z7982 Long term (current) use of aspirin: Secondary | ICD-10-CM

## 2016-09-17 DIAGNOSIS — Z7951 Long term (current) use of inhaled steroids: Secondary | ICD-10-CM

## 2016-09-17 DIAGNOSIS — I10 Essential (primary) hypertension: Secondary | ICD-10-CM | POA: Insufficient documentation

## 2016-09-17 DIAGNOSIS — F419 Anxiety disorder, unspecified: Secondary | ICD-10-CM | POA: Insufficient documentation

## 2016-09-17 DIAGNOSIS — I70445 Atherosclerosis of autologous vein bypass graft(s) of the left leg with ulceration of other part of foot: Secondary | ICD-10-CM | POA: Insufficient documentation

## 2016-09-17 DIAGNOSIS — I70245 Atherosclerosis of native arteries of left leg with ulceration of other part of foot: Secondary | ICD-10-CM | POA: Diagnosis not present

## 2016-09-17 DIAGNOSIS — Z833 Family history of diabetes mellitus: Secondary | ICD-10-CM | POA: Insufficient documentation

## 2016-09-17 DIAGNOSIS — G8929 Other chronic pain: Secondary | ICD-10-CM

## 2016-09-17 DIAGNOSIS — J449 Chronic obstructive pulmonary disease, unspecified: Secondary | ICD-10-CM | POA: Insufficient documentation

## 2016-09-17 DIAGNOSIS — F17210 Nicotine dependence, cigarettes, uncomplicated: Secondary | ICD-10-CM | POA: Insufficient documentation

## 2016-09-17 DIAGNOSIS — Z8249 Family history of ischemic heart disease and other diseases of the circulatory system: Secondary | ICD-10-CM | POA: Insufficient documentation

## 2016-09-17 HISTORY — PX: PERIPHERAL VASCULAR CATHETERIZATION: SHX172C

## 2016-09-17 LAB — POCT I-STAT, CHEM 8
BUN: 22 mg/dL — ABNORMAL HIGH (ref 6–20)
CALCIUM ION: 1.15 mmol/L (ref 1.15–1.40)
CHLORIDE: 99 mmol/L — AB (ref 101–111)
Creatinine, Ser: 0.9 mg/dL (ref 0.61–1.24)
Glucose, Bld: 103 mg/dL — ABNORMAL HIGH (ref 65–99)
HEMATOCRIT: 44 % (ref 39.0–52.0)
Hemoglobin: 15 g/dL (ref 13.0–17.0)
Potassium: 3.6 mmol/L (ref 3.5–5.1)
SODIUM: 139 mmol/L (ref 135–145)
TCO2: 28 mmol/L (ref 0–100)

## 2016-09-17 SURGERY — ABDOMINAL AORTOGRAM W/LOWER EXTREMITY

## 2016-09-17 MED ORDER — LIDOCAINE HCL (PF) 1 % IJ SOLN
INTRAMUSCULAR | Status: AC
  Filled 2016-09-17: qty 30

## 2016-09-17 MED ORDER — OXYCODONE HCL 5 MG PO TABS
ORAL_TABLET | ORAL | Status: AC
  Filled 2016-09-17: qty 2

## 2016-09-17 MED ORDER — GUAIFENESIN-DM 100-10 MG/5ML PO SYRP
15.0000 mL | ORAL_SOLUTION | ORAL | Status: DC | PRN
  Filled 2016-09-17: qty 15

## 2016-09-17 MED ORDER — ACETAMINOPHEN 325 MG RE SUPP
325.0000 mg | RECTAL | Status: DC | PRN
  Filled 2016-09-17: qty 2

## 2016-09-17 MED ORDER — MIDAZOLAM HCL 2 MG/2ML IJ SOLN
INTRAMUSCULAR | Status: AC
  Filled 2016-09-17: qty 2

## 2016-09-17 MED ORDER — ALUM & MAG HYDROXIDE-SIMETH 200-200-20 MG/5ML PO SUSP
15.0000 mL | ORAL | Status: DC | PRN
  Filled 2016-09-17: qty 30

## 2016-09-17 MED ORDER — SODIUM CHLORIDE 0.9 % IV SOLN: 1.0000 mL/kg/h | INTRAVENOUS | Status: DC

## 2016-09-17 MED ORDER — IODIXANOL 320 MG/ML IV SOLN
INTRAVENOUS | Status: DC | PRN
  Administered 2016-09-17: 77 mL via INTRA_ARTERIAL

## 2016-09-17 MED ORDER — HYDRALAZINE HCL 20 MG/ML IJ SOLN: 5.0000 mg | INTRAMUSCULAR | Status: DC | PRN

## 2016-09-17 MED ORDER — DOCUSATE SODIUM 100 MG PO CAPS: 100.0000 mg | ORAL_CAPSULE | Freq: Every day | ORAL | Status: DC

## 2016-09-17 MED ORDER — MORPHINE SULFATE (PF) 4 MG/ML IV SOLN
INTRAVENOUS | Status: AC
  Administered 2016-09-17: 4 mg
  Filled 2016-09-17: qty 1

## 2016-09-17 MED ORDER — OXYCODONE HCL 5 MG PO TABS
5.0000 mg | ORAL_TABLET | ORAL | Status: DC | PRN
  Administered 2016-09-17: 10 mg via ORAL

## 2016-09-17 MED ORDER — ONDANSETRON HCL 4 MG/2ML IJ SOLN: 4.0000 mg | Freq: Four times a day (QID) | INTRAMUSCULAR | Status: DC | PRN

## 2016-09-17 MED ORDER — ACETAMINOPHEN 325 MG PO TABS: 325.0000 mg | ORAL_TABLET | ORAL | Status: DC | PRN

## 2016-09-17 MED ORDER — METOPROLOL TARTRATE 5 MG/5ML IV SOLN: 2.0000 mg | INTRAVENOUS | Status: DC | PRN

## 2016-09-17 MED ORDER — HEPARIN (PORCINE) IN NACL 2-0.9 UNIT/ML-% IJ SOLN
INTRAMUSCULAR | Status: DC | PRN
  Administered 2016-09-17: 1000 mL

## 2016-09-17 MED ORDER — MIDAZOLAM HCL 2 MG/2ML IJ SOLN
INTRAMUSCULAR | Status: DC | PRN
  Administered 2016-09-17: 2 mg via INTRAVENOUS

## 2016-09-17 MED ORDER — FENTANYL CITRATE (PF) 100 MCG/2ML IJ SOLN
INTRAMUSCULAR | Status: DC | PRN
  Administered 2016-09-17: 50 ug via INTRAVENOUS

## 2016-09-17 MED ORDER — FENTANYL CITRATE (PF) 100 MCG/2ML IJ SOLN
INTRAMUSCULAR | Status: AC
  Filled 2016-09-17: qty 2

## 2016-09-17 MED ORDER — PHENOL 1.4 % MT LIQD
1.0000 | OROMUCOSAL | Status: DC | PRN
  Filled 2016-09-17: qty 177

## 2016-09-17 MED ORDER — LABETALOL HCL 5 MG/ML IV SOLN: 10.0000 mg | INTRAVENOUS | Status: DC | PRN

## 2016-09-17 MED ORDER — HEPARIN (PORCINE) IN NACL 2-0.9 UNIT/ML-% IJ SOLN
INTRAMUSCULAR | Status: AC
  Filled 2016-09-17: qty 1000

## 2016-09-17 MED ORDER — MORPHINE SULFATE (PF) 10 MG/ML IV SOLN
2.0000 mg | INTRAVENOUS | Status: DC | PRN
  Administered 2016-09-17: 4 mg via INTRAVENOUS

## 2016-09-17 MED ORDER — SODIUM CHLORIDE 0.9 % IV SOLN
INTRAVENOUS | Status: DC
  Administered 2016-09-17: 09:00:00 via INTRAVENOUS

## 2016-09-17 MED ORDER — LIDOCAINE HCL (PF) 1 % IJ SOLN
INTRAMUSCULAR | Status: DC | PRN
  Administered 2016-09-17: 12 mL

## 2016-09-17 MED ORDER — MORPHINE SULFATE (PF) 4 MG/ML IV SOLN
INTRAVENOUS | Status: AC
  Filled 2016-09-17: qty 1

## 2016-09-17 SURGICAL SUPPLY — 11 items
CATH OMNI FLUSH 5F 65CM (CATHETERS) ×3 IMPLANT
COVER PRB 48X5XTLSCP FOLD TPE (BAG) ×2 IMPLANT
COVER PROBE 5X48 (BAG) ×6
DRAPE ZERO GRAVITY STERILE (DRAPES) ×3 IMPLANT
KIT PV (KITS) ×3 IMPLANT
SHEATH PINNACLE 5F 10CM (SHEATH) ×3 IMPLANT
SYR MEDRAD MARK V 150ML (SYRINGE) ×3 IMPLANT
TRANSDUCER W/STOPCOCK (MISCELLANEOUS) ×3 IMPLANT
TRAY PV CATH (CUSTOM PROCEDURE TRAY) ×3 IMPLANT
WIRE BENTSON .035X145CM (WIRE) ×3 IMPLANT
WIRE MINI STICK MAX (SHEATH) ×3 IMPLANT

## 2016-09-17 NOTE — Op Note (Signed)
    Patient name: Christopher GageDavid H Grygiel MRN: [id number] DOB: 1946/02/28 Sex: male  09/17/2016 Pre-operative Diagnosis: Left foot ulcer Post-operative diagnosis:  Same Surgeon:  [name], Wells Procedure Performed:  1.  Ultrasound-guided access, right femoral artery  2.  Abdominal aortogram  3.  Second order catheterization  4.  Left lower extremity runoff  5.  Conscious sedation (15minutes)  Indications:  The patient has a history of a left femoral anterior tibial bypass graft for limb salvage.  This is previously occluded.  It was able to be opened however the vein was noted to be very sclerotic.  He was originally scheduled for angiogram last week but rescheduled.  According to him the pulse within the graft was last yesterday and gone today.  He is here today for further evaluation  Procedure:  The patient was identified in the holding area and taken to room 8.  The patient was then placed supine on the table and prepped and draped in the usual sterile fashion.  A time out was called.  Conscious sedation was performed with the use of IV fentanyl and Versed in a continuous physician and nurse monitoring.  Heart rate, blood pressure, and oxygen saturations were continuously monitored.  Ultrasound was used to evaluate the right common femoral artery.  It was patent .  A digital ultrasound image was acquired.  A micropuncture needle was used to access the right common femoral artery under ultrasound guidance.  An 018 wire was advanced without resistance and a micropuncture sheath was placed.  The 018 wire was removed and a benson wire was placed.  The micropuncture sheath was exchanged for a 5 french sheath.  An omniflush catheter was advanced over the wire to the level of L-1.  An abdominal angiogram was obtained.  Next, using the omniflush catheter and a benson wire, the aortic bifurcation was crossed and the catheter was placed into theleft external iliac artery and left runoff was obtained.    Findings:     Aortogram:  No significant renal artery stenosis.  No aortic stenosis.  Bilateral common and external iliac arteries are widely patent  Right Lower Extremity:  Not evaluated  Left Lower Extremity:  Left common femoral profunda femoral artery are patent.  The bypass graft is not visualized.  There is reconstitution of the anterior tibial and peroneal artery.  The anterior tibial is the dominant vessel across the ankle.  Intervention:  None  Impression:  #1  occluded left femoral anterior tibial bypass graft.  #2  consideration for redo bypass graft.   [name] Ebany Bowermaster, M.D. [facility] Office: [phone] Pager:  [phone]

## 2016-09-17 NOTE — H&P (View-Only) (Signed)
[facility]  Patient name: Christopher Pena MRN: [id number] DOB: Jun 02, 1946 Sex: male  REASON FOR VISIT: follow up  HPI: Christopher Pena a 71 y.o.malewho is status post left femoral to anterior tibial bypass graft on 12/10/2015. This was done for a wound on his left great toe. He has a history of a right femoral to below-knee popliteal bypass graft with vein which has occluded. He was last seen on 04/15/2016 and his bypass graft was patent.  On 05/20/2016 he came back in the office without a pulse in his bypass graft.  He stated that have been going on for approximately 3 weeks.  He had also developed a wound on his great toe.  On 05/21/2016 he was taken to the operating room for a thrombectomy of his superficial femoral anterior tibial bypass graft.  I was able to get the bypass graft open, however the vein appeared to be sclerotic.  I performed angioplasty blindly in the operating room.  The following day he continued to have a pulse within the graft and therefore I had him taken to the Angio suite for arteriogram and additional balloon angioplasty with a 4 mm balloon.  He was discharged home on Plavix.  He has developed worsening pain in his left foot and a shorter distance claudication over the past 5 days.  He now has an ulcer on the first and third toe which have been present for several months but look a little worse.  The pain in his foot is also worse on the left.  Past Medical History:  Diagnosis Date  . Anxiety   . Chronic low back pain    "degenerative spine dx'd ~ 7 yr ago" (06/08/2013)  . Claudication (HCC)   . COPD (chronic obstructive pulmonary disease) (HCC)   . Emphysema of lung (HCC)   . Hypertension    "moderately high; RX didn't help" (06/08/2013)  . Neuromuscular disorder (HCC)    degen spine  . Non-healing wound of lower extremity 06/09/2013  . PAD (peripheral artery disease), PTA/stent IDEV &  chocolate baloon 06/08/13 04/13/2013   Severely reduced ABI's of 0.3 bilaterally   . Shortness of breath    w/ exertion  . Tobacco abuse   . Tobacco use 06/09/2013  . Unexplained weight loss     Family History  Problem Relation Age of Onset  . Hyperlipidemia Mother   . Heart disease Mother   . Diabetes Mother   . Cancer Father     Liver    SOCIAL HISTORY: Social History  Substance Use Topics  . Smoking status: Light Tobacco Smoker    Packs/day: 0.25    Years: 50.00    Types: Cigarettes    Start date: 07/03/1963  . Smokeless tobacco: Never Used     Comment: states "he is ready to quit"  . Alcohol use No     Comment: 06/08/2013 "quit 02/2007; drank my share before that though"    Allergies  Allergen Reactions  . Clindamycin/Lincomycin Diarrhea    Current Outpatient Prescriptions  Medication Sig Dispense Refill  . aspirin EC 81 MG tablet Take 81 mg by mouth daily.    . budesonide (PULMICORT) 0.25 MG/2ML nebulizer solution Use one vial twice daily 120 mL 12  . cetirizine (ZYRTEC) 10 MG tablet Take 1 tablet (10 mg total) by mouth daily. 30 tablet 11  . clopidogrel (PLAVIX) 75 MG tablet Take 1 tablet (75 mg total) by mouth daily. 30 tablet 11  .  gabapentin (NEURONTIN) 300 MG capsule Take 1 capsule (300 mg total) by mouth 3 (three) times daily as needed (pain).    Marland Kitchen oxyCODONE (ROXICODONE) 15 MG immediate release tablet Take 15 mg by mouth every 4 (four) hours as needed for pain.    Marland Kitchen albuterol (PROAIR HFA) 108 (90 Base) MCG/ACT inhaler Inhale 2 puffs into the lungs every 4 (four) hours as needed for wheezing or shortness of breath. (Patient not taking: Reported on 09/02/2016) 1 Inhaler 6   No current facility-administered medications for this visit.     REVIEW OF SYSTEMS:  [X]  denotes positive finding, [ ]  denotes negative finding Cardiac  Comments:  Chest pain or chest pressure:    Shortness of breath upon exertion:    Short of breath when lying flat:    Irregular heart  rhythm:        Vascular    Pain in calf, thigh, or hip brought on by ambulation: x   Pain in feet at night that wakes you up from your sleep:     Blood clot in your veins:    Leg swelling:         Pulmonary    Oxygen at home:    Productive cough:     Wheezing:         Neurologic    Sudden weakness in arms or legs:     Sudden numbness in arms or legs:     Sudden onset of difficulty speaking or slurred speech:    Temporary loss of vision in one eye:     Problems with dizziness:         Gastrointestinal    Blood in stool:     Vomited blood:         Genitourinary    Burning when urinating:     Blood in urine:        Psychiatric    Major depression:         Hematologic    Bleeding problems:    Problems with blood clotting too easily:        Skin    Rashes or ulcers: X       Constitutional    Fever or chills:      PHYSICAL EXAM: Vitals:   09/02/16 1110  BP: 130/74  Pulse: 75  Resp: 18  Temp: 97.9 F (36.6 C)  TempSrc: Oral  SpO2: 98%  Weight: 123 lb (55.8 kg)  Height: 5\' 7"  (1.702 m)    GENERAL: The patient is a well-nourished male, in no acute distress. The vital signs are documented above. CARDIAC: There is a regular rate and rhythm.  VASCULAR: Palpable left femoral anterior tibial graft pulse. PULMONARY: Non-labored respirations ABDOMEN: Soft and non-tender with normal pitched bowel sounds.  MUSCULOSKELETAL: There are no major deformities or cyanosis. NEUROLOGIC: No focal weakness or paresthesias are detected. SKIN: Chronic ulcers to the left great and third toe PSYCHIATRIC: The patient has a normal affect.  DATA:  None  MEDICAL ISSUES: I am concerned that the patient has developed progressive changes within his bypass graft or within his posterior tibial artery distally.  This is evidenced by the fact that he has developed a shorter distance claudication over the past 5 days as well as a bluish discoloration in his foot.  I have recommended that we  get angiography done in the next day or 2, however he states that his nurse her size that he wants to wait at least until next week.  I have scheduled for Tuesday, January 23.  I will cannulate his right groin perform aortogram with bilateral runoff and intervene on the left leg.  I told him I would be very aggressive about dilating his bypass graft as he had neointimal hyperplasia but did respond well initially but most likely has had some recoil.  I would also be aggressive about intervening on his anterior tibial artery below the bypass graft as this would be for limb salvage.  He does know that if he no longer feels a pulse in his bypass graft that he needs to be evaluated immediately    Durene Cal, MD [facility] [phone] Pager [phone]

## 2016-09-17 NOTE — Interval H&P Note (Signed)
History and Physical Interval Note:  09/17/2016 11:07 AM  Christopher Pena  has presented today for surgery, with the diagnosis of pvd  The various methods of treatment have been discussed with the patient and family. After consideration of risks, benefits and other options for treatment, the patient has consented to  Procedure(s): Abdominal Aortogram w/Lower Extremity (N/A) as a surgical intervention .  The patient's history has been reviewed, patient examined, no change in status, stable for surgery.  I have reviewed the patient's chart and labs.  Questions were answered to the patient's satisfaction.     [name], [place]

## 2016-09-17 NOTE — Progress Notes (Signed)
Site area: rt groin Site Prior to Removal:  Level 0 Pressure Applied For: 20 minutes Manual:   yes Patient Status During Pull:  stable Post Pull Site:  Level  0 Post Pull Instructions Given:  yes Post Pull Pulses Present: dopplered Dressing Applied:  yes Bedrest begins @ 1235 Comments:

## 2016-09-17 NOTE — Discharge Instructions (Signed)
Femoral Site Care °Introduction °Refer to this sheet in the next few weeks. These instructions provide you with information about caring for yourself after your procedure. Your health care provider may also give you more specific instructions. Your treatment has been planned according to current medical practices, but problems sometimes occur. Call your health care provider if you have any problems or questions after your procedure. °What can I expect after the procedure? °After your procedure, it is typical to have the following: °· Bruising at the site that usually fades within 1-2 weeks. °· Blood collecting in the tissue (hematoma) that may be painful to the touch. It should usually decrease in size and tenderness within 1-2 weeks. °Follow these instructions at home: °· Take medicines only as directed by your health care provider. °· You may shower 24-48 hours after the procedure or as directed by your health care provider. Remove the bandage (dressing) and gently wash the site with plain soap and water. Pat the area dry with a clean towel. Do not rub the site, because this may cause bleeding. °· Do not take baths, swim, or use a hot tub until your health care provider approves. °· Check your insertion site every day for redness, swelling, or drainage. °· Do not apply powder or lotion to the site. °· Limit use of stairs to twice a day for the first 2-3 days or as directed by your health care provider. °· Do not squat for the first 2-3 days or as directed by your health care provider. °· Do not lift over 10 lb (4.5 kg) for 5 days after your procedure or as directed by your health care provider. °· Ask your health care provider when it is okay to: °¨ Return to work or school. °¨ Resume usual physical activities or sports. °¨ Resume sexual activity. °· Do not drive home if you are discharged the same day as the procedure. Have someone else drive you. °· You may drive 24 hours after the procedure unless otherwise  instructed by your health care provider. °· Do not operate machinery or power tools for 24 hours after the procedure or as directed by your health care provider. °· If your procedure was done as an outpatient procedure, which means that you went home the same day as your procedure, a responsible adult should be with you for the first 24 hours after you arrive home. °· Keep all follow-up visits as directed by your health care provider. This is important. °Contact a health care provider if: °· You have a fever. °· You have chills. °· You have increased bleeding from the site. Hold pressure on the site. °Get help right away if: °· You have unusual pain at the site. °· You have redness, warmth, or swelling at the site. °· You have drainage (other than a small amount of blood on the dressing) from the site. °· The site is bleeding, and the bleeding does not stop after 30 minutes of holding steady pressure on the site. °· Your leg or foot becomes pale, cool, tingly, or numb. °This information is not intended to replace advice given to you by your health care provider. Make sure you discuss any questions you have with your health care provider. °Document Released: 04/08/2014 Document Revised: 01/11/2016 Document Reviewed: 02/22/2014 °© 2017 Elsevier ° °

## 2016-09-18 ENCOUNTER — Encounter (HOSPITAL_COMMUNITY): Payer: Self-pay | Admitting: Surgery

## 2016-09-19 ENCOUNTER — Encounter (HOSPITAL_COMMUNITY): Payer: Self-pay | Admitting: *Deleted

## 2016-09-19 NOTE — Progress Notes (Signed)
Spoke with pt for pre-op call. Pt denies cardiac history, chest pain or sob. 

## 2016-09-20 ENCOUNTER — Inpatient Hospital Stay (HOSPITAL_COMMUNITY): Payer: Medicare Other | Admitting: Certified Registered Nurse Anesthetist

## 2016-09-20 ENCOUNTER — Encounter (HOSPITAL_COMMUNITY)
Admission: RE | Disposition: A | Discharge status: Home / Self Care | Payer: Self-pay | Source: Ambulatory Visit | Attending: Surgery

## 2016-09-20 ENCOUNTER — Inpatient Hospital Stay (HOSPITAL_COMMUNITY)
Admission: RE | Admit: 2016-09-20 | Discharge: 2016-09-23 | DRG: 253 | Disposition: A | Discharge status: Home / Self Care | Payer: Medicare Other | Source: Ambulatory Visit | Attending: Surgery | Admitting: Surgery

## 2016-09-20 ENCOUNTER — Encounter (HOSPITAL_COMMUNITY): Payer: Self-pay | Admitting: Certified Registered Nurse Anesthetist

## 2016-09-20 DIAGNOSIS — Z7951 Long term (current) use of inhaled steroids: Secondary | ICD-10-CM

## 2016-09-20 DIAGNOSIS — Z95820 Peripheral vascular angioplasty status with implants and grafts: Secondary | ICD-10-CM | POA: Diagnosis not present

## 2016-09-20 DIAGNOSIS — J439 Emphysema, unspecified: Secondary | ICD-10-CM | POA: Diagnosis present

## 2016-09-20 DIAGNOSIS — I739 Peripheral vascular disease, unspecified: Secondary | ICD-10-CM | POA: Diagnosis present

## 2016-09-20 DIAGNOSIS — R058 Other specified cough: Secondary | ICD-10-CM

## 2016-09-20 DIAGNOSIS — Z800 Family history of malignant neoplasm of digestive organs: Secondary | ICD-10-CM | POA: Diagnosis not present

## 2016-09-20 DIAGNOSIS — G8929 Other chronic pain: Secondary | ICD-10-CM | POA: Diagnosis present

## 2016-09-20 DIAGNOSIS — Z881 Allergy status to other antibiotic agents status: Secondary | ICD-10-CM | POA: Diagnosis not present

## 2016-09-20 DIAGNOSIS — L97529 Non-pressure chronic ulcer of other part of left foot with unspecified severity: Secondary | ICD-10-CM | POA: Diagnosis present

## 2016-09-20 DIAGNOSIS — Z7982 Long term (current) use of aspirin: Secondary | ICD-10-CM | POA: Diagnosis not present

## 2016-09-20 DIAGNOSIS — J449 Chronic obstructive pulmonary disease, unspecified: Secondary | ICD-10-CM

## 2016-09-20 DIAGNOSIS — I10 Essential (primary) hypertension: Secondary | ICD-10-CM | POA: Diagnosis present

## 2016-09-20 DIAGNOSIS — Z8249 Family history of ischemic heart disease and other diseases of the circulatory system: Secondary | ICD-10-CM | POA: Diagnosis not present

## 2016-09-20 DIAGNOSIS — F419 Anxiety disorder, unspecified: Secondary | ICD-10-CM | POA: Diagnosis present

## 2016-09-20 DIAGNOSIS — J9811 Atelectasis: Secondary | ICD-10-CM | POA: Diagnosis not present

## 2016-09-20 DIAGNOSIS — I70211 Atherosclerosis of native arteries of extremities with intermittent claudication, right leg: Secondary | ICD-10-CM | POA: Diagnosis present

## 2016-09-20 DIAGNOSIS — Z7902 Long term (current) use of antithrombotics/antiplatelets: Secondary | ICD-10-CM | POA: Diagnosis not present

## 2016-09-20 DIAGNOSIS — I70312 Atherosclerosis of unspecified type of bypass graft(s) of the extremities with intermittent claudication, left leg: Secondary | ICD-10-CM | POA: Diagnosis present

## 2016-09-20 DIAGNOSIS — R509 Fever, unspecified: Secondary | ICD-10-CM

## 2016-09-20 DIAGNOSIS — I70445 Atherosclerosis of autologous vein bypass graft(s) of the left leg with ulceration of other part of foot: Principal | ICD-10-CM | POA: Diagnosis present

## 2016-09-20 DIAGNOSIS — F17210 Nicotine dependence, cigarettes, uncomplicated: Secondary | ICD-10-CM | POA: Diagnosis present

## 2016-09-20 DIAGNOSIS — I70245 Atherosclerosis of native arteries of left leg with ulceration of other part of foot: Secondary | ICD-10-CM

## 2016-09-20 DIAGNOSIS — M545 Low back pain: Secondary | ICD-10-CM | POA: Diagnosis present

## 2016-09-20 DIAGNOSIS — R05 Cough: Secondary | ICD-10-CM

## 2016-09-20 HISTORY — PX: FEMORAL-TIBIAL BYPASS GRAFT: SHX938

## 2016-09-20 HISTORY — DX: Drug induced constipation: K59.03

## 2016-09-20 LAB — PROTIME-INR
INR: 1.02
PROTHROMBIN TIME: 13.4 s (ref 11.4–15.2)

## 2016-09-20 LAB — COMPREHENSIVE METABOLIC PANEL
ALBUMIN: 4.3 g/dL (ref 3.5–5.0)
ALT: 12 U/L — ABNORMAL LOW (ref 17–63)
ANION GAP: 13 (ref 5–15)
AST: 29 U/L (ref 15–41)
Alkaline Phosphatase: 57 U/L (ref 38–126)
BILIRUBIN TOTAL: 0.6 mg/dL (ref 0.3–1.2)
BUN: 18 mg/dL (ref 6–20)
CO2: 24 mmol/L (ref 22–32)
Calcium: 9.2 mg/dL (ref 8.9–10.3)
Chloride: 102 mmol/L (ref 101–111)
Creatinine, Ser: 1.01 mg/dL (ref 0.61–1.24)
GFR calc Af Amer: >60 mL/min (ref >60)
Glucose, Bld: 122 mg/dL — ABNORMAL HIGH (ref 65–99)
POTASSIUM: 3.4 mmol/L — AB (ref 3.5–5.1)
Sodium: 139 mmol/L (ref 135–145)
TOTAL PROTEIN: 7.6 g/dL (ref 6.5–8.1)

## 2016-09-20 LAB — CBC
HEMATOCRIT: 44.4 % (ref 39.0–52.0)
Hemoglobin: 14.7 g/dL (ref 13.0–17.0)
MCH: 31.9 pg (ref 26.0–34.0)
MCHC: 33.1 g/dL (ref 30.0–36.0)
MCV: 96.3 fL (ref 78.0–100.0)
Platelets: 255 10*3/uL (ref 150–400)
RBC: 4.61 MIL/uL (ref 4.22–5.81)
RDW: 14 % (ref 11.5–15.5)
WBC: 11.5 10*3/uL — ABNORMAL HIGH (ref 4.0–10.5)

## 2016-09-20 LAB — SURGICAL PCR SCREEN
MRSA, PCR: NEGATIVE
Staphylococcus aureus: POSITIVE — AB

## 2016-09-20 LAB — TYPE AND SCREEN
ABO/RH(D): A NEG
ANTIBODY SCREEN: NEGATIVE

## 2016-09-20 LAB — APTT: aPTT: 33 seconds (ref 24–36)

## 2016-09-20 SURGERY — CREATION, BYPASS, ARTERIAL, FEMORAL TO TIBIAL, USING GRAFT
Anesthesia: General | Laterality: Left

## 2016-09-20 MED ORDER — MAGNESIUM SULFATE 2 GM/50ML IV SOLN: 2.0000 g | Freq: Every day | INTRAVENOUS | Status: DC | PRN

## 2016-09-20 MED ORDER — HYDROMORPHONE HCL 1 MG/ML IJ SOLN: 0.5000 mg | INTRAMUSCULAR | Status: DC | PRN

## 2016-09-20 MED ORDER — HYDROMORPHONE HCL 1 MG/ML IJ SOLN
INTRAMUSCULAR | Status: AC
  Filled 2016-09-20: qty 2

## 2016-09-20 MED ORDER — SUGAMMADEX SODIUM 200 MG/2ML IV SOLN
INTRAVENOUS | Status: DC | PRN
  Administered 2016-09-20: 100 mg via INTRAVENOUS

## 2016-09-20 MED ORDER — OXYCODONE HCL 5 MG PO TABS
15.0000 mg | ORAL_TABLET | ORAL | Status: DC | PRN
  Administered 2016-09-20 – 2016-09-23 (×16): 15 mg via ORAL
  Filled 2016-09-20 (×16): qty 3

## 2016-09-20 MED ORDER — HEMOSTATIC AGENTS (NO CHARGE) OPTIME
TOPICAL | Status: DC | PRN
  Administered 2016-09-20 (×2): 1 via TOPICAL

## 2016-09-20 MED ORDER — ROCURONIUM BROMIDE 10 MG/ML (PF) SYRINGE
PREFILLED_SYRINGE | INTRAVENOUS | Status: DC | PRN
  Administered 2016-09-20: 40 mg via INTRAVENOUS
  Administered 2016-09-20 (×3): 10 mg via INTRAVENOUS

## 2016-09-20 MED ORDER — PHENYLEPHRINE HCL 10 MG/ML IJ SOLN
INTRAVENOUS | Status: DC | PRN
  Administered 2016-09-20: 20 ug/min via INTRAVENOUS

## 2016-09-20 MED ORDER — ONDANSETRON HCL 4 MG/2ML IJ SOLN: 4.0000 mg | Freq: Four times a day (QID) | INTRAMUSCULAR | Status: DC | PRN

## 2016-09-20 MED ORDER — ALUM & MAG HYDROXIDE-SIMETH 200-200-20 MG/5ML PO SUSP: 15.0000 mL | ORAL | Status: DC | PRN

## 2016-09-20 MED ORDER — GUAIFENESIN-DM 100-10 MG/5ML PO SYRP: 15.0000 mL | ORAL_SOLUTION | ORAL | Status: DC | PRN

## 2016-09-20 MED ORDER — SODIUM CHLORIDE 0.9 % IV SOLN
INTRAVENOUS | Status: DC | PRN
  Administered 2016-09-20: 11:00:00

## 2016-09-20 MED ORDER — MIDAZOLAM HCL 2 MG/2ML IJ SOLN
INTRAMUSCULAR | Status: AC
  Filled 2016-09-20: qty 2

## 2016-09-20 MED ORDER — FENTANYL CITRATE (PF) 100 MCG/2ML IJ SOLN
INTRAMUSCULAR | Status: AC
  Filled 2016-09-20: qty 2

## 2016-09-20 MED ORDER — DEXTROSE 5 % IV SOLN
1.5000 g | Freq: Two times a day (BID) | INTRAVENOUS | Status: AC
  Administered 2016-09-20 – 2016-09-21 (×2): 1.5 g via INTRAVENOUS
  Filled 2016-09-20 (×2): qty 1.5

## 2016-09-20 MED ORDER — MORPHINE SULFATE (PF) 2 MG/ML IV SOLN
2.0000 mg | INTRAVENOUS | Status: DC | PRN
  Administered 2016-09-20 – 2016-09-23 (×18): 4 mg via INTRAVENOUS
  Filled 2016-09-20 (×18): qty 2

## 2016-09-20 MED ORDER — ENOXAPARIN SODIUM 40 MG/0.4ML ~~LOC~~ SOLN
40.0000 mg | SUBCUTANEOUS | Status: DC
  Administered 2016-09-21 – 2016-09-23 (×3): 40 mg via SUBCUTANEOUS
  Filled 2016-09-20 (×3): qty 0.4

## 2016-09-20 MED ORDER — LACTATED RINGERS IV SOLN
INTRAVENOUS | Status: DC
  Administered 2016-09-20 (×3): via INTRAVENOUS

## 2016-09-20 MED ORDER — HYDROMORPHONE HCL 1 MG/ML IJ SOLN
0.2500 mg | INTRAMUSCULAR | Status: DC | PRN
Start: 2016-09-20 — End: 2016-09-20
  Administered 2016-09-20 (×6): 0.5 mg via INTRAVENOUS

## 2016-09-20 MED ORDER — PROTAMINE SULFATE 10 MG/ML IV SOLN
INTRAVENOUS | Status: DC | PRN
  Administered 2016-09-20: 50 mg via INTRAVENOUS
  Administered 2016-09-20: 25 mg via INTRAVENOUS
  Administered 2016-09-20: 10 mg via INTRAVENOUS

## 2016-09-20 MED ORDER — SUGAMMADEX SODIUM 200 MG/2ML IV SOLN
INTRAVENOUS | Status: AC
  Filled 2016-09-20: qty 4

## 2016-09-20 MED ORDER — LABETALOL HCL 5 MG/ML IV SOLN: 10.0000 mg | INTRAVENOUS | Status: DC | PRN

## 2016-09-20 MED ORDER — SUCCINYLCHOLINE CHLORIDE 200 MG/10ML IV SOSY
PREFILLED_SYRINGE | INTRAVENOUS | Status: AC
  Filled 2016-09-20: qty 20

## 2016-09-20 MED ORDER — PANTOPRAZOLE SODIUM 40 MG PO TBEC
40.0000 mg | DELAYED_RELEASE_TABLET | Freq: Every day | ORAL | Status: DC
  Administered 2016-09-20 – 2016-09-23 (×4): 40 mg via ORAL
  Filled 2016-09-20 (×4): qty 1

## 2016-09-20 MED ORDER — HYDRALAZINE HCL 20 MG/ML IJ SOLN: 5.0000 mg | INTRAMUSCULAR | Status: DC | PRN

## 2016-09-20 MED ORDER — ONDANSETRON HCL 4 MG/2ML IJ SOLN
INTRAMUSCULAR | Status: DC | PRN
  Administered 2016-09-20: 4 mg via INTRAVENOUS

## 2016-09-20 MED ORDER — ASPIRIN EC 81 MG PO TBEC
81.0000 mg | DELAYED_RELEASE_TABLET | Freq: Every evening | ORAL | Status: DC
  Administered 2016-09-20 – 2016-09-22 (×3): 81 mg via ORAL
  Filled 2016-09-20 (×3): qty 1

## 2016-09-20 MED ORDER — ACETAMINOPHEN 325 MG PO TABS
325.0000 mg | ORAL_TABLET | ORAL | Status: DC | PRN
  Administered 2016-09-21: 650 mg via ORAL
  Filled 2016-09-20: qty 2

## 2016-09-20 MED ORDER — DEXTROSE 5 % IV SOLN
1.5000 g | INTRAVENOUS | Status: AC
  Administered 2016-09-20: 1.5 g via INTRAVENOUS
  Filled 2016-09-20: qty 1.5

## 2016-09-20 MED ORDER — MUPIROCIN 2 % EX OINT
TOPICAL_OINTMENT | CUTANEOUS | Status: AC
  Filled 2016-09-20: qty 22

## 2016-09-20 MED ORDER — BISACODYL 5 MG PO TBEC
5.0000 mg | DELAYED_RELEASE_TABLET | Freq: Every day | ORAL | Status: DC | PRN
  Filled 2016-09-20: qty 1

## 2016-09-20 MED ORDER — PHENYLEPHRINE 40 MCG/ML (10ML) SYRINGE FOR IV PUSH (FOR BLOOD PRESSURE SUPPORT)
PREFILLED_SYRINGE | INTRAVENOUS | Status: DC | PRN
  Administered 2016-09-20: 40 ug via INTRAVENOUS
  Administered 2016-09-20: 80 ug via INTRAVENOUS
  Administered 2016-09-20: 40 ug via INTRAVENOUS

## 2016-09-20 MED ORDER — PROTAMINE SULFATE 10 MG/ML IV SOLN
INTRAVENOUS | Status: AC
  Filled 2016-09-20: qty 50

## 2016-09-20 MED ORDER — ROCURONIUM BROMIDE 50 MG/5ML IV SOSY
PREFILLED_SYRINGE | INTRAVENOUS | Status: AC
  Filled 2016-09-20: qty 15

## 2016-09-20 MED ORDER — HYDROMORPHONE HCL 1 MG/ML IJ SOLN
INTRAMUSCULAR | Status: AC
  Filled 2016-09-20: qty 1

## 2016-09-20 MED ORDER — CHLORHEXIDINE GLUCONATE CLOTH 2 % EX PADS: 6.0000 | MEDICATED_PAD | Freq: Once | CUTANEOUS | Status: DC

## 2016-09-20 MED ORDER — ONDANSETRON HCL 4 MG/2ML IJ SOLN
INTRAMUSCULAR | Status: AC
  Filled 2016-09-20: qty 4

## 2016-09-20 MED ORDER — PHENYLEPHRINE 40 MCG/ML (10ML) SYRINGE FOR IV PUSH (FOR BLOOD PRESSURE SUPPORT)
PREFILLED_SYRINGE | INTRAVENOUS | Status: AC
  Filled 2016-09-20: qty 30

## 2016-09-20 MED ORDER — SENNOSIDES-DOCUSATE SODIUM 8.6-50 MG PO TABS: 1.0000 | ORAL_TABLET | Freq: Every evening | ORAL | Status: DC | PRN

## 2016-09-20 MED ORDER — LIDOCAINE 2% (20 MG/ML) 5 ML SYRINGE
INTRAMUSCULAR | Status: DC | PRN
  Administered 2016-09-20: 60 mg via INTRAVENOUS

## 2016-09-20 MED ORDER — OXYCODONE HCL 5 MG PO TABS
ORAL_TABLET | ORAL | Status: AC
  Filled 2016-09-20: qty 3

## 2016-09-20 MED ORDER — HEPARIN SODIUM (PORCINE) 1000 UNIT/ML IJ SOLN
INTRAMUSCULAR | Status: AC
  Filled 2016-09-20: qty 2

## 2016-09-20 MED ORDER — LIDOCAINE 2% (20 MG/ML) 5 ML SYRINGE
INTRAMUSCULAR | Status: AC
  Filled 2016-09-20: qty 10

## 2016-09-20 MED ORDER — PROPOFOL 10 MG/ML IV BOLUS
INTRAVENOUS | Status: DC | PRN
  Administered 2016-09-20: 130 mg via INTRAVENOUS
  Administered 2016-09-20: 20 mg via INTRAVENOUS

## 2016-09-20 MED ORDER — POTASSIUM CHLORIDE CRYS ER 20 MEQ PO TBCR
20.0000 meq | EXTENDED_RELEASE_TABLET | Freq: Every day | ORAL | Status: AC | PRN
  Administered 2016-09-20: 20 meq via ORAL
  Filled 2016-09-20: qty 1

## 2016-09-20 MED ORDER — IOPAMIDOL (ISOVUE-300) INJECTION 61%
INTRAVENOUS | Status: AC
  Filled 2016-09-20: qty 50

## 2016-09-20 MED ORDER — MUPIROCIN 2 % EX OINT
1.0000 "application " | TOPICAL_OINTMENT | Freq: Once | CUTANEOUS | Status: AC
  Administered 2016-09-20: 1 via TOPICAL

## 2016-09-20 MED ORDER — ACETAMINOPHEN 650 MG RE SUPP: 325.0000 mg | RECTAL | Status: DC | PRN

## 2016-09-20 MED ORDER — 0.9 % SODIUM CHLORIDE (POUR BTL) OPTIME
TOPICAL | Status: DC | PRN
  Administered 2016-09-20: 2000 mL

## 2016-09-20 MED ORDER — PROPOFOL 10 MG/ML IV BOLUS
INTRAVENOUS | Status: AC
  Filled 2016-09-20: qty 20

## 2016-09-20 MED ORDER — METOPROLOL TARTRATE 5 MG/5ML IV SOLN: 2.0000 mg | INTRAVENOUS | Status: DC | PRN

## 2016-09-20 MED ORDER — PHENOL 1.4 % MT LIQD: 1.0000 | OROMUCOSAL | Status: DC | PRN

## 2016-09-20 MED ORDER — DIAZEPAM 5 MG PO TABS
5.0000 mg | ORAL_TABLET | Freq: Four times a day (QID) | ORAL | Status: DC | PRN
  Administered 2016-09-20 – 2016-09-22 (×4): 5 mg via ORAL
  Filled 2016-09-20 (×4): qty 1

## 2016-09-20 MED ORDER — DOCUSATE SODIUM 100 MG PO CAPS
100.0000 mg | ORAL_CAPSULE | Freq: Every day | ORAL | Status: DC
  Administered 2016-09-21 – 2016-09-23 (×3): 100 mg via ORAL
  Filled 2016-09-20 (×3): qty 1

## 2016-09-20 MED ORDER — SODIUM CHLORIDE 0.9 % IV SOLN
INTRAVENOUS | Status: DC
  Administered 2016-09-20 – 2016-09-23 (×4): via INTRAVENOUS

## 2016-09-20 MED ORDER — FENTANYL CITRATE (PF) 100 MCG/2ML IJ SOLN
INTRAMUSCULAR | Status: DC | PRN
  Administered 2016-09-20 (×2): 25 ug via INTRAVENOUS
  Administered 2016-09-20 (×2): 50 ug via INTRAVENOUS
  Administered 2016-09-20 (×3): 25 ug via INTRAVENOUS
  Administered 2016-09-20: 50 ug via INTRAVENOUS
  Administered 2016-09-20: 25 ug via INTRAVENOUS

## 2016-09-20 MED ORDER — SODIUM CHLORIDE 0.9 % IV SOLN
500.0000 mL | Freq: Once | INTRAVENOUS | Status: DC | PRN
Start: 2016-09-20 — End: 2016-09-23

## 2016-09-20 MED ORDER — SODIUM CHLORIDE 0.9 % IV SOLN: INTRAVENOUS | Status: DC

## 2016-09-20 MED ORDER — HEPARIN SODIUM (PORCINE) 1000 UNIT/ML IJ SOLN
INTRAMUSCULAR | Status: AC
  Filled 2016-09-20: qty 1

## 2016-09-20 MED ORDER — EPHEDRINE SULFATE-NACL 50-0.9 MG/10ML-% IV SOSY
PREFILLED_SYRINGE | INTRAVENOUS | Status: DC | PRN
  Administered 2016-09-20: 5 mg via INTRAVENOUS

## 2016-09-20 MED ORDER — ALBUMIN HUMAN 5 % IV SOLN
INTRAVENOUS | Status: DC | PRN
  Administered 2016-09-20: 14:00:00 via INTRAVENOUS

## 2016-09-20 MED ORDER — HEPARIN SODIUM (PORCINE) 1000 UNIT/ML IJ SOLN
INTRAMUSCULAR | Status: DC | PRN
  Administered 2016-09-20: 5000 [IU] via INTRAVENOUS
  Administered 2016-09-20: 1000 [IU] via INTRAVENOUS

## 2016-09-20 MED ORDER — BUDESONIDE 0.25 MG/2ML IN SUSP: 0.2500 mg | Freq: Two times a day (BID) | RESPIRATORY_TRACT | Status: DC | PRN

## 2016-09-20 MED ORDER — EPHEDRINE 5 MG/ML INJ
INTRAVENOUS | Status: AC
  Filled 2016-09-20: qty 30

## 2016-09-20 MED ORDER — PROMETHAZINE HCL 25 MG/ML IJ SOLN
INTRAMUSCULAR | Status: AC
  Filled 2016-09-20: qty 1

## 2016-09-20 MED ORDER — PROMETHAZINE HCL 25 MG/ML IJ SOLN
6.2500 mg | INTRAMUSCULAR | Status: DC | PRN
  Administered 2016-09-20: 12.5 mg via INTRAVENOUS

## 2016-09-20 SURGICAL SUPPLY — 65 items
BANDAGE ACE 4X5 VEL STRL LF (GAUZE/BANDAGES/DRESSINGS) IMPLANT
BANDAGE ESMARK 6X9 LF (GAUZE/BANDAGES/DRESSINGS) ×1 IMPLANT
BNDG CMPR 9X6 STRL LF SNTH (GAUZE/BANDAGES/DRESSINGS) ×1
BNDG ESMARK 6X9 LF (GAUZE/BANDAGES/DRESSINGS) ×2
CANISTER SUCTION 2500CC (MISCELLANEOUS) ×2 IMPLANT
CANNULA VESSEL 3MM 2 BLNT TIP (CANNULA) IMPLANT
CATH EMB 3FR 40CM (CATHETERS) ×2 IMPLANT
CLIP TI MEDIUM 24 (CLIP) ×2 IMPLANT
CLIP TI WIDE RED SMALL 24 (CLIP) ×2 IMPLANT
CUFF TOURNIQUET SINGLE 24IN (TOURNIQUET CUFF) IMPLANT
CUFF TOURNIQUET SINGLE 34IN LL (TOURNIQUET CUFF) IMPLANT
CUFF TOURNIQUET SINGLE 44IN (TOURNIQUET CUFF) IMPLANT
DERMABOND ADVANCED (GAUZE/BANDAGES/DRESSINGS) ×1
DERMABOND ADVANCED .7 DNX12 (GAUZE/BANDAGES/DRESSINGS) ×1 IMPLANT
DRAIN CHANNEL 15F RND FF W/TCR (WOUND CARE) ×2 IMPLANT
DRAPE PROXIMA HALF (DRAPES) IMPLANT
DRAPE X-RAY CASS 24X20 (DRAPES) IMPLANT
DRSG COVADERM 4X10 (GAUZE/BANDAGES/DRESSINGS) ×2 IMPLANT
DRSG COVADERM 4X8 (GAUZE/BANDAGES/DRESSINGS) IMPLANT
ELECT REM PT RETURN 9FT ADLT (ELECTROSURGICAL) ×2
ELECTRODE REM PT RTRN 9FT ADLT (ELECTROSURGICAL) ×1 IMPLANT
EVACUATOR SILICONE 100CC (DRAIN) ×2 IMPLANT
GAUZE SPONGE 4X4 16PLY XRAY LF (GAUZE/BANDAGES/DRESSINGS) ×2 IMPLANT
GLOVE BIOGEL PI IND STRL 7.5 (GLOVE) ×3 IMPLANT
GLOVE BIOGEL PI INDICATOR 7.5 (GLOVE) ×3
GLOVE ECLIPSE 7.0 STRL STRAW (GLOVE) ×2 IMPLANT
GLOVE SURG SS PI 6.5 STRL IVOR (GLOVE) ×2 IMPLANT
GLOVE SURG SS PI 7.5 STRL IVOR (GLOVE) ×4 IMPLANT
GOWN STRL REUS W/ TWL LRG LVL3 (GOWN DISPOSABLE) ×2 IMPLANT
GOWN STRL REUS W/ TWL XL LVL3 (GOWN DISPOSABLE) ×1 IMPLANT
GOWN STRL REUS W/TWL LRG LVL3 (GOWN DISPOSABLE) ×4
GOWN STRL REUS W/TWL XL LVL3 (GOWN DISPOSABLE) ×2
GRAFT PROPATEN W/RING 6X80X60 (Vascular Products) ×2 IMPLANT
HEMOSTAT SNOW SURGICEL 2X4 (HEMOSTASIS) IMPLANT
KIT BASIN OR (CUSTOM PROCEDURE TRAY) ×2 IMPLANT
KIT ROOM TURNOVER OR (KITS) ×2 IMPLANT
LIQUID BAND (GAUZE/BANDAGES/DRESSINGS) ×2 IMPLANT
MARKER GRAFT CORONARY BYPASS (MISCELLANEOUS) IMPLANT
NS IRRIG 1000ML POUR BTL (IV SOLUTION) ×4 IMPLANT
PACK PERIPHERAL VASCULAR (CUSTOM PROCEDURE TRAY) ×2 IMPLANT
PAD ARMBOARD 7.5X6 YLW CONV (MISCELLANEOUS) ×4 IMPLANT
PADDING CAST COTTON 6X4 STRL (CAST SUPPLIES) IMPLANT
SET COLLECT BLD 21X3/4 12 (NEEDLE) IMPLANT
SLEEVE SURGEON STRL (DRAPES) ×2 IMPLANT
SPONGE LAP 18X18 X RAY DECT (DISPOSABLE) ×4 IMPLANT
STOPCOCK 4 WAY LG BORE MALE ST (IV SETS) IMPLANT
SUT ETHILON 3 0 PS 1 (SUTURE) IMPLANT
SUT PROLENE 2 0 SH DA (SUTURE) ×8 IMPLANT
SUT PROLENE 5 0 C 1 24 (SUTURE) ×8 IMPLANT
SUT PROLENE 6 0 BV (SUTURE) ×12 IMPLANT
SUT PROLENE 7 0 BV 1 (SUTURE) IMPLANT
SUT SILK 2 0 SH (SUTURE) ×2 IMPLANT
SUT SILK 3 0 (SUTURE)
SUT SILK 3-0 18XBRD TIE 12 (SUTURE) IMPLANT
SUT VIC AB 2-0 CT1 27 (SUTURE) ×4
SUT VIC AB 2-0 CT1 TAPERPNT 27 (SUTURE) ×2 IMPLANT
SUT VIC AB 3-0 SH 27 (SUTURE) ×8
SUT VIC AB 3-0 SH 27X BRD (SUTURE) ×4 IMPLANT
SUT VICRYL 4-0 PS2 18IN ABS (SUTURE) ×4 IMPLANT
SYR 3ML LL SCALE MARK (SYRINGE) ×2 IMPLANT
TAPE UMBILICAL COTTON 1/8X30 (MISCELLANEOUS) IMPLANT
TRAY FOLEY W/METER SILVER 16FR (SET/KITS/TRAYS/PACK) ×2 IMPLANT
TUBING EXTENTION W/L.L. (IV SETS) IMPLANT
UNDERPAD 30X30 (UNDERPADS AND DIAPERS) ×2 IMPLANT
WATER STERILE IRR 1000ML POUR (IV SOLUTION) ×2 IMPLANT

## 2016-09-20 NOTE — Progress Notes (Signed)
      Left leg incisions intact, groin is soft, JP drain in place left LE no out put since surgery. Doppler signal left  AT  S/P re-do left fem-AT by pass with pro paten ringed graft Pain control may be an issue he was on 15 mg oxycodone q 4 before surgery for pain issues and sees a pain management MD.  Thomasena EdisOLLINS, Christopher Pena Marisue HumbleMAUREEN PA-C

## 2016-09-20 NOTE — Anesthesia Procedure Notes (Deleted)
Performed by: Ranika Mcniel W       

## 2016-09-20 NOTE — Anesthesia Procedure Notes (Signed)
Procedure Name: Intubation Date/Time: 09/20/2016 10:51 AM Performed by: Merdis Delay Pre-anesthesia Checklist: Patient identified, Emergency Drugs available, Suction available, Patient being monitored and Timeout performed Patient Re-evaluated:Patient Re-evaluated prior to inductionOxygen Delivery Method: Circle system utilized Preoxygenation: Pre-oxygenation with 100% oxygen Intubation Type: IV induction Ventilation: Oral airway inserted - appropriate to patient size and Mask ventilation without difficulty Laryngoscope Size: Mac and 3 Grade View: Grade I Tube type: Oral Tube size: 7.5 mm Number of attempts: 1 Airway Equipment and Method: Stylet Placement Confirmation: ETT inserted through vocal cords under direct vision,  positive ETCO2,  CO2 detector and breath sounds checked- equal and bilateral Secured at: 23 cm Tube secured with: Tape Dental Injury: Teeth and Oropharynx as per pre-operative assessment

## 2016-09-20 NOTE — Transfer of Care (Signed)
Immediate Anesthesia Transfer of Care Note  Patient: Christopher Pena  Procedure(s) Performed: Procedure(s): REDO BYPASS GRAFT FEMORAL-ANTERIOR TIBIAL ARTERY (Left)  Patient Location: PACU  Anesthesia Type:General  Level of Consciousness: awake, alert  and oriented  Airway & Oxygen Therapy: Patient Spontanous Breathing  Post-op Assessment: Report given to RN and Post -op Vital signs reviewed and stable  Post vital signs: Reviewed and stable  Last Vitals:  Vitals:   09/20/16 0911  BP: 139/89  Pulse: (!) 107  Resp: 18  Temp: 36.8 C    Last Pain:  Vitals:   09/20/16 0911  TempSrc: Oral         Complications: No apparent anesthesia complications

## 2016-09-20 NOTE — Op Note (Signed)
Patient name: Christopher Pena MRN: [id number] DOB: [birth year] Sex: male  09/20/2016 Pre-operative Diagnosis: left foot ulcer Post-operative diagnosis:  Same Surgeon:  Durene Cal Assistants:  Narda Amber Procedure:   #1  Left femoral to anterior tibial bypass with 6mm ringed Propatent PTFE   #2:  Re-do exposure of left femoral and anterior tibial arteries Anesthesia:  General Blood Loss:  See anesthesia record Specimens:  none   Indications:  The patient has previously undergone a left femral tibial bypass with vein for limb salvage.  This occluded several months ago and he was taken back to the OR for a thrombectomy.  His vein was found to be sclerotic.  I got the bypass open and performed angioplasty of the vein.  He has yet to heal his wounds.  He was scheduled for angiography and repeat PTA of his bypass, however it occluded before his angiogram, and therefore, I elected to re-do the bypass with PTFE, given how quickly the by[pass re-occluded and how sclerotic the vein was  Procedure:  The patient was identified in the holding area and taken to [facility] OR ROOM 11  The patient was then placed supine on the table. general anesthesia was administered.  The patient was prepped and draped in the usual sterile fashion.  A time out was called and antibiotics were administered.  Th eprevious longitudinal femoral incision was re-opened.  With sharp and cautery dissection, I isolated the DFA, PFA, and SFA as well as the previous vein bypass.  Once there was aqequate exposure, I turned my attention to the anterior tibial artery.  I made a longitudinal incision in the lateral lower leg, just below the previous incision.  Through this incision, I exposed the anterior tibial artery.  It appeared to be a disease free artery measuring about 3,,/ I then used a long Gore tunneller to creat a tunnel between the 2 incisions.  It was difficult to pass the tunneller due to scar tissue, but ultimately I was able to do  so.  He was fully heparanized.  After the heparin had circulated, The femoral vessels were occluded.  I then removed the previous vein bypass with a #11 blade and Pottts scissors.  The CFA appeared healthy.  I then spatulate the end of a 5mm external ring PTFE graft anf performed the proximal anastamosis with 5-0 prolene.  Several repair sutures were used for hemostaisis.  I then brought the graft through the previously created tunnel, making sure to maintain the proper orientation.  A tournaquet was then placed on the upper thigh.  An esmarch was used to exanguinate the leg and the tournaquet was infalted to 250 mmHg.  I opened the ATA longitudinally with a # 11 blade and extended the arteriotomy with Potts scissors.  I passed a #3 Fogarty catheter without resistance across the ankle.  The graft was cut to the propper length and a running anastamosis was completed with 6-0 prolene suture.  Prior to completion, the appropriate flushing maneuvers were performed and the anastamosis was completed.  He had a graft dependant ATA doppler signal.  75 mg of protamine was given.  He had diffuse oozing from all of his wounds.  I used multiple topical hemostatic agents to help control the bleeding.  I placed a 15 blake drain in the lower incision.  Once I was satisfied with hemostasis, I closed the groin incision with multiple layers of vicryl suture followed by a 4-0 vicryl and dermabond on the skin.  The lower leg incision was closed by re-approximating muscle ofer the graft.  I re-approximated the subcutaneous tissue as well without closing the fascia.  I placed 4 2-0 prolene retention sutures and closed the skin with 4-0 vicryl followed by Dermabond   Disposition:  To PACU in stable condition.   [name] Lisseth Brazeau, M.D. [facility] Office: [phone] Pager:  [phone]

## 2016-09-20 NOTE — Anesthesia Postprocedure Evaluation (Signed)
Anesthesia Post Note  Patient: Christopher Pena  Procedure(s) Performed: Procedure(s) (LRB): REDO BYPASS GRAFT FEMORAL-ANTERIOR TIBIAL ARTERY (Left)  Patient location during evaluation: PACU Anesthesia Type: General Level of consciousness: awake and alert Pain management: pain level controlled Vital Signs Assessment: post-procedure vital signs reviewed and stable Respiratory status: spontaneous breathing, nonlabored ventilation, respiratory function stable and patient connected to nasal cannula oxygen Cardiovascular status: blood pressure returned to baseline and stable Postop Assessment: no signs of nausea or vomiting Anesthetic complications: no       Last Vitals:  Vitals:   09/20/16 1541 09/20/16 1545  BP: 108/68   Pulse: 82 81  Resp: (!) 21 20  Temp:      Last Pain:  Vitals:   09/20/16 1541  TempSrc:   PainSc: 10-Worst pain ever                 Kennieth RadFitzgerald, Temekia Caskey E

## 2016-09-20 NOTE — Anesthesia Preprocedure Evaluation (Addendum)
Anesthesia Evaluation  Patient identified by MRN, date of birth, ID band Patient awake    Reviewed: Allergy & Precautions, NPO status , Patient's Chart, lab work & pertinent test results  History of Anesthesia Complications Negative for: history of anesthetic complications  Airway Mallampati: I  TM Distance: >3 FB Neck ROM: Full    Dental  (+) Upper Dentures, Lower Dentures, Dental Advisory Given   Pulmonary shortness of breath, COPD, Current Smoker,    breath sounds clear to auscultation       Cardiovascular hypertension, + Peripheral Vascular Disease   Rhythm:Regular Rate:Normal     Neuro/Psych Anxiety  Neuromuscular disease    GI/Hepatic negative GI ROS, Neg liver ROS,   Endo/Other  negative endocrine ROS  Renal/GU negative Renal ROS     Musculoskeletal   Abdominal   Peds  Hematology negative hematology ROS (+)   Anesthesia Other Findings   Reproductive/Obstetrics                            Lab Results  Component Value Date   WBC 10.6 (H) 08/14/2016   HGB 15.0 09/17/2016   HCT 44.0 09/17/2016   MCV 96.3 08/14/2016   PLT 257.0 08/14/2016   Lab Results  Component Value Date   CREATININE 0.90 09/17/2016   BUN 22 (H) 09/17/2016   NA 139 09/17/2016   K 3.6 09/17/2016   CL 99 (L) 09/17/2016   CO2 30 08/14/2016    Anesthesia Physical  Anesthesia Plan  ASA: III  Anesthesia Plan: General   Post-op Pain Management:    Induction: Intravenous  Airway Management Planned: Oral ETT  Additional Equipment:   Intra-op Plan:   Post-operative Plan: Extubation in OR  Informed Consent: I have reviewed the patients History and Physical, chart, labs and discussed the procedure including the risks, benefits and alternatives for the proposed anesthesia with the patient or authorized representative who has indicated his/her understanding and acceptance.   Dental advisory given  Plan  Discussed with: CRNA  Anesthesia Plan Comments:         Anesthesia Quick Evaluation

## 2016-09-20 NOTE — Interval H&P Note (Signed)
History and Physical Interval Note:  09/20/2016 10:00 AM  Christopher Pena  has presented today for surgery, with the diagnosis of Peripheral vascular disease with bilateral lower extremity claudication I70.213  The various methods of treatment have been discussed with the patient and family. After consideration of risks, benefits and other options for treatment, the patient has consented to  Procedure(s): REDO BYPASS GRAFT FEMORAL-ANTERIOR TIBIAL ARTERY (Left) as a surgical intervention .  The patient's history has been reviewed, patient examined, no change in status, stable for surgery.  I have reviewed the patient's chart and labs.  Questions were answered to the patient's satisfaction.     [name], [place]  When patient showed up for angiography on Tuesday, his BPG had occluded.  Given the poor quality of his vein I elected to redo his BPG with gortex.  He has been off plavix for 3 days.  Again he was councelled about smoking cessation  [name]

## 2016-09-20 NOTE — H&P (View-Only) (Signed)
[facility]  Patient name: Christopher GageDavid H Pena MRN: [id number] DOB: Jun 02, 1946 Sex: male  REASON FOR VISIT: follow up  HPI: Christopher BalesDavid H Pena a 71 y.o.malewho is status post left femoral to anterior tibial bypass graft on 12/10/2015. This was done for a wound on his left great toe. He has a history of a right femoral to below-knee popliteal bypass graft with vein which has occluded. He was last seen on 04/15/2016 and his bypass graft was patent.  On 05/20/2016 he came back in the office without a pulse in his bypass graft.  He stated that have been going on for approximately 3 weeks.  He had also developed a wound on his great toe.  On 05/21/2016 he was taken to the operating room for a thrombectomy of his superficial femoral anterior tibial bypass graft.  I was able to get the bypass graft open, however the vein appeared to be sclerotic.  I performed angioplasty blindly in the operating room.  The following day he continued to have a pulse within the graft and therefore I had him taken to the Angio suite for arteriogram and additional balloon angioplasty with a 4 mm balloon.  He was discharged home on Plavix.  He has developed worsening pain in his left foot and a shorter distance claudication over the past 5 days.  He now has an ulcer on the first and third toe which have been present for several months but look a little worse.  The pain in his foot is also worse on the left.  Past Medical History:  Diagnosis Date  . Anxiety   . Chronic low back pain    "degenerative spine dx'd ~ 7 yr ago" (06/08/2013)  . Claudication (HCC)   . COPD (chronic obstructive pulmonary disease) (HCC)   . Emphysema of lung (HCC)   . Hypertension    "moderately high; RX didn't help" (06/08/2013)  . Neuromuscular disorder (HCC)    degen spine  . Non-healing wound of lower extremity 06/09/2013  . PAD (peripheral artery disease), PTA/stent IDEV &  chocolate baloon 06/08/13 04/13/2013   Severely reduced ABI's of 0.3 bilaterally   . Shortness of breath    w/ exertion  . Tobacco abuse   . Tobacco use 06/09/2013  . Unexplained weight loss     Family History  Problem Relation Age of Onset  . Hyperlipidemia Mother   . Heart disease Mother   . Diabetes Mother   . Cancer Father     Liver    SOCIAL HISTORY: Social History  Substance Use Topics  . Smoking status: Light Tobacco Smoker    Packs/day: 0.25    Years: 50.00    Types: Cigarettes    Start date: 07/03/1963  . Smokeless tobacco: Never Used     Comment: states "he is ready to quit"  . Alcohol use No     Comment: 06/08/2013 "quit 02/2007; drank my share before that though"    Allergies  Allergen Reactions  . Clindamycin/Lincomycin Diarrhea    Current Outpatient Prescriptions  Medication Sig Dispense Refill  . aspirin EC 81 MG tablet Take 81 mg by mouth daily.    . budesonide (PULMICORT) 0.25 MG/2ML nebulizer solution Use one vial twice daily 120 mL 12  . cetirizine (ZYRTEC) 10 MG tablet Take 1 tablet (10 mg total) by mouth daily. 30 tablet 11  . clopidogrel (PLAVIX) 75 MG tablet Take 1 tablet (75 mg total) by mouth daily. 30 tablet 11  .  gabapentin (NEURONTIN) 300 MG capsule Take 1 capsule (300 mg total) by mouth 3 (three) times daily as needed (pain).    Marland Kitchen oxyCODONE (ROXICODONE) 15 MG immediate release tablet Take 15 mg by mouth every 4 (four) hours as needed for pain.    Marland Kitchen albuterol (PROAIR HFA) 108 (90 Base) MCG/ACT inhaler Inhale 2 puffs into the lungs every 4 (four) hours as needed for wheezing or shortness of breath. (Patient not taking: Reported on 09/02/2016) 1 Inhaler 6   No current facility-administered medications for this visit.     REVIEW OF SYSTEMS:  [X]  denotes positive finding, [ ]  denotes negative finding Cardiac  Comments:  Chest pain or chest pressure:    Shortness of breath upon exertion:    Short of breath when lying flat:    Irregular heart  rhythm:        Vascular    Pain in calf, thigh, or hip brought on by ambulation: x   Pain in feet at night that wakes you up from your sleep:     Blood clot in your veins:    Leg swelling:         Pulmonary    Oxygen at home:    Productive cough:     Wheezing:         Neurologic    Sudden weakness in arms or legs:     Sudden numbness in arms or legs:     Sudden onset of difficulty speaking or slurred speech:    Temporary loss of vision in one eye:     Problems with dizziness:         Gastrointestinal    Blood in stool:     Vomited blood:         Genitourinary    Burning when urinating:     Blood in urine:        Psychiatric    Major depression:         Hematologic    Bleeding problems:    Problems with blood clotting too easily:        Skin    Rashes or ulcers: X       Constitutional    Fever or chills:      PHYSICAL EXAM: Vitals:   09/02/16 1110  BP: 130/74  Pulse: 75  Resp: 18  Temp: 97.9 F (36.6 C)  TempSrc: Oral  SpO2: 98%  Weight: 123 lb (55.8 kg)  Height: 5\' 7"  (1.702 m)    GENERAL: The patient is a well-nourished male, in no acute distress. The vital signs are documented above. CARDIAC: There is a regular rate and rhythm.  VASCULAR: Palpable left femoral anterior tibial graft pulse. PULMONARY: Non-labored respirations ABDOMEN: Soft and non-tender with normal pitched bowel sounds.  MUSCULOSKELETAL: There are no major deformities or cyanosis. NEUROLOGIC: No focal weakness or paresthesias are detected. SKIN: Chronic ulcers to the left great and third toe PSYCHIATRIC: The patient has a normal affect.  DATA:  None  MEDICAL ISSUES: I am concerned that the patient has developed progressive changes within his bypass graft or within his posterior tibial artery distally.  This is evidenced by the fact that he has developed a shorter distance claudication over the past 5 days as well as a bluish discoloration in his foot.  I have recommended that we  get angiography done in the next day or 2, however he states that his nurse her size that he wants to wait at least until next week.  I have scheduled for Tuesday, January 23.  I will cannulate his right groin perform aortogram with bilateral runoff and intervene on the left leg.  I told him I would be very aggressive about dilating his bypass graft as he had neointimal hyperplasia but did respond well initially but most likely has had some recoil.  I would also be aggressive about intervening on his anterior tibial artery below the bypass graft as this would be for limb salvage.  He does know that if he no longer feels a pulse in his bypass graft that he needs to be evaluated immediately    Durene Cal, MD [facility] of [facility] [phone] Pager [phone]

## 2016-09-21 ENCOUNTER — Inpatient Hospital Stay (HOSPITAL_COMMUNITY): Payer: Medicare Other

## 2016-09-21 LAB — CBC
HCT: 32.6 % — ABNORMAL LOW (ref 39.0–52.0)
Hemoglobin: 10.9 g/dL — ABNORMAL LOW (ref 13.0–17.0)
MCH: 32 pg (ref 26.0–34.0)
MCHC: 33.4 g/dL (ref 30.0–36.0)
MCV: 95.6 fL (ref 78.0–100.0)
PLATELETS: 179 10*3/uL (ref 150–400)
RBC: 3.41 MIL/uL — AB (ref 4.22–5.81)
RDW: 13.7 % (ref 11.5–15.5)
WBC: 10 10*3/uL (ref 4.0–10.5)

## 2016-09-21 LAB — BASIC METABOLIC PANEL
ANION GAP: 6 (ref 5–15)
BUN: 11 mg/dL (ref 6–20)
CALCIUM: 8.3 mg/dL — AB (ref 8.9–10.3)
CO2: 28 mmol/L (ref 22–32)
Chloride: 102 mmol/L (ref 101–111)
Creatinine, Ser: 0.86 mg/dL (ref 0.61–1.24)
GFR calc Af Amer: >60 mL/min (ref >60)
GFR calc non Af Amer: >60 mL/min (ref >60)
GLUCOSE: 102 mg/dL — AB (ref 65–99)
Potassium: 4.3 mmol/L (ref 3.5–5.1)
SODIUM: 136 mmol/L (ref 135–145)

## 2016-09-21 LAB — URINALYSIS, ROUTINE W REFLEX MICROSCOPIC
BILIRUBIN URINE: NEGATIVE
Glucose, UA: NEGATIVE mg/dL
Ketones, ur: 5 mg/dL — AB
Nitrite: NEGATIVE
Protein, ur: NEGATIVE mg/dL
SPECIFIC GRAVITY, URINE: 1.017 (ref 1.005–1.030)
SQUAMOUS EPITHELIAL / LPF: NONE SEEN
pH: 5 (ref 5.0–8.0)

## 2016-09-21 MED ORDER — NICOTINE 7 MG/24HR TD PT24
7.0000 mg | MEDICATED_PATCH | Freq: Every day | TRANSDERMAL | Status: DC
  Administered 2016-09-21 – 2016-09-23 (×3): 7 mg via TRANSDERMAL
  Filled 2016-09-21 (×3): qty 1

## 2016-09-21 NOTE — Evaluation (Signed)
Physical Therapy Evaluation Patient Details Name: JOSHUAL TERRIO MRN: [id number] DOB: 04-23-1946 Today's Date: 09/21/2016   History of Present Illness  Pt is a 71 y/o male s/p L femoral to anterior tibial bypass. PMH including but not limited to PAD, chronic LBP, COPD, HTN and tobacco use.   Clinical Impression  Pt presented supine in bed with HOB elevated, awake and initially very hesitant about mobilizing; however, with max encouragement pt agreeable to sit EOB and stand. Prior to admission, pt reported that he was independent with functional mobility and ADLs. Pt currently requires min guard for safety with bed mobility and min A for stability with transfers. Pt unable to ambulate at this time secondary to pain in L LE despite max encouragement from therapist. Pt tolerated standing for ~5 minutes with lateral weight shifting. All VSS throughout. Pt would continue to benefit from skilled physical therapy services at this time while admitted and after d/c to address his below listed limitations in order to improve his overall safety and independence with functional mobility.      Follow Up Recommendations Home health PT;Supervision/Assistance - 24 hour    Equipment Recommendations  None recommended by PT    Recommendations for Other Services       Precautions / Restrictions Precautions Precautions: Fall Restrictions Weight Bearing Restrictions: No      Mobility  Bed Mobility Overal bed mobility: Needs Assistance Bed Mobility: Supine to Sit;Sit to Supine     Supine to sit: Min guard Sit to supine: Min guard   General bed mobility comments: increased time, min guard for safety  Transfers Overall transfer level: Needs assistance Equipment used: Rolling walker (2 wheeled) Transfers: Sit to/from Stand Sit to Stand: Min assist         General transfer comment: increased time, VC'ing for bilateral hand placement and min A for stability with tranfser  Ambulation/Gait             General Gait Details: pt attempted taking steps but unable to secondary to L LE pain despite therapist's max encouragement and stressing the importance of mobility  Stairs            Wheelchair Mobility    Modified Rankin (Stroke Patients Only)       Balance Overall balance assessment: Needs assistance Sitting-balance support: Feet supported Sitting balance-Leahy Scale: Fair     Standing balance support: During functional activity;Bilateral upper extremity supported Standing balance-Leahy Scale: Poor Standing balance comment: pt reliant on bilateral UEs on RW; participate in lateral weight shifting to improve weight bearing on L LE                             Pertinent Vitals/Pain Pain Assessment: Faces Faces Pain Scale: Hurts even more Pain Location: L great toe and foot Pain Descriptors / Indicators: Grimacing;Guarding;Moaning;Sore Pain Intervention(s): Monitored during session;Repositioned    Home Living Family/patient expects to be discharged to:: Private residence Living Arrangements: Alone Available Help at Discharge: Family;Available 24 hours/day   Home Access: Level entry     Home Layout: One level Home Equipment: Walker - 2 wheels      Prior Function Level of Independence: Independent               Hand Dominance        Extremity/Trunk Assessment   Upper Extremity Assessment Upper Extremity Assessment: Overall WFL for tasks assessed    Lower Extremity Assessment Lower Extremity Assessment: LLE  deficits/detail LLE Deficits / Details: pt unable to WB greater than 50% on L secondary to pain    Cervical / Trunk Assessment Cervical / Trunk Assessment: Normal  Communication   Communication: No difficulties  Cognition Arousal/Alertness: Awake/alert Behavior During Therapy: WFL for tasks assessed/performed Overall Cognitive Status: Within Functional Limits for tasks assessed                      General  Comments      Exercises General Exercises - Lower Extremity Ankle Circles/Pumps: AROM;Left;5 reps;Seated;Limitations;Other (comment) (very limited range secondary to pain) Long Arc Quad: AROM;Both;10 reps;Seated   Assessment/Plan    PT Assessment Patient needs continued PT services  PT Problem List Decreased strength;Decreased range of motion;Decreased activity tolerance;Decreased balance;Decreased mobility;Decreased coordination;Decreased knowledge of use of DME;Decreased safety awareness;Cardiopulmonary status limiting activity;Pain          PT Treatment Interventions DME instruction;Gait training;Functional mobility training;Therapeutic activities;Therapeutic exercise;Balance training;Neuromuscular re-education;Patient/family education    PT Goals (Current goals can be found in the Care Plan section)  Acute Rehab PT Goals Patient Stated Goal: return home PT Goal Formulation: With patient Time For Goal Achievement: 10/05/16 Potential to Achieve Goals: Good    Frequency Min 3X/week   Barriers to discharge        Co-evaluation               End of Session Equipment Utilized During Treatment: Gait belt Activity Tolerance: Patient limited by pain Patient left: in bed;with call bell/phone within reach;with nursing/sitter in room (NT in room) Nurse Communication: Mobility status         Time: 0840-0900 PT Time Calculation (min) (ACUTE ONLY): 20 min   Charges:   PT Evaluation $PT Eval Moderate Complexity: 1 Procedure     PT G CodesAlessandra Bevels:        Manley Fason M Inita Uram 09/21/2016, 10:55 AM Deborah ChalkJennifer Tavarus [place], PT, DPT [phone]

## 2016-09-21 NOTE — Progress Notes (Signed)
Upon entering patients room for transfer CNA Orlie PollenLynne saw that the patient was holding a prescription bottle for pain medication in his hand, tech notified nurse.  Discussed with patient importance of notifying staff of presence of home medications.  Patient asked if he took any of the medicine pt states "no I didn't take anything."   Instructed patient that medications will need to be removed from bedside and either sent home with family or down to pharmacy for patients safety.  Patient states "I have family to take them."  Transported patient to 2W09 updated receiving nurse Jan with regards to patients home medication.  Receiving nurse to follow up.

## 2016-09-21 NOTE — Progress Notes (Signed)
Patient arrived to 2W room 9.  Telemetry monitor applied and CCMD notified.  Patient oriented to unit and room to include phone and call light.  Will continue to monitor.

## 2016-09-21 NOTE — Progress Notes (Addendum)
  [facility]ists Progress Note  Subjective  - POD #1  Entire left leg hurts. Coughing up "sticky" mucus.   Objective Vitals:   09/21/16 0010 09/21/16 0430  BP: 102/60 114/63  Pulse: 95 100  Resp: 19 (!) 23  Temp: 98.2 F (36.8 C) (!) 101.2 F (38.4 C)    Intake/Output Summary (Last 24 hours) at 09/21/16 0751 Last data filed at 09/21/16 0649  Gross per 24 hour  Intake             2075 ml  Output             1835 ml  Net              240 ml    Left groin incision intact. No hematoma.  Left lateral calf incision sutures intact. Mild oozing at proximal incision. JP drain with clotted tubing, <10 cc output in bulb. Left peroneal, AT and PT doppler signals. Dry ulcer to tip of left great toe.   Assessment/Planning: 71 y.o. male is s/p: redo left femoral to anterior tibial bypass with PTFE 1 Day Post-Op   Good doppler flow this am to left foot. Did run fever of 101.2 this am. Likely post-op atelectasis. Encourage IS.  Given productive cough, will order CXR. Will also check UA and blood cultures given presence of prosthetic graft.  D/c JP drain.  Needs to mobilize today.  Transfer to 2W.   Ranae PlumberKimberly A Trinh 09/21/2016 7:51 AM --  Laboratory CBC    Component Value Date/Time   WBC 10.0 09/21/2016 0250   HGB 10.9 (L) 09/21/2016 0250   HCT 32.6 (L) 09/21/2016 0250   PLT 179 09/21/2016 0250    BMET    Component Value Date/Time   NA 136 09/21/2016 0250   K 4.3 09/21/2016 0250   CL 102 09/21/2016 0250   CO2 28 09/21/2016 0250   GLUCOSE 102 (H) 09/21/2016 0250   BUN 11 09/21/2016 0250   CREATININE 0.86 09/21/2016 0250   CREATININE 0.69 05/11/2014 1533   CALCIUM 8.3 (L) 09/21/2016 0250   GFRNONAA >60 09/21/2016 0250   GFRNONAA >89 04/19/2014 1418   GFRAA >60 09/21/2016 0250   GFRAA >89 04/19/2014 1418    COAG Lab Results  Component Value Date   INR 1.02 09/20/2016   INR 1.02 05/21/2016   INR 1.11 12/06/2015   No results found for:  PTT  Antibiotics Anti-infectives    Start     Dose/Rate Route Frequency Ordered Stop   09/20/16 2200  cefUROXime (ZINACEF) 1.5 g in dextrose 5 % 50 mL IVPB     1.5 g 100 mL/hr over 30 Minutes Intravenous Every 12 hours 09/20/16 1838 09/21/16 2159   09/20/16 0901  cefUROXime (ZINACEF) 1.5 g in dextrose 5 % 50 mL IVPB     1.5 g 100 mL/hr over 30 Minutes Intravenous 30 min pre-op 09/20/16 0901 09/20/16 1127       Maris BergerKimberly Trinh, PA-C [facility] Office: [phone] Pager: [phone] 09/21/2016 7:51 AM  I have examined the patient, reviewed and agree with above.Reports more soreness in his prior surgeries. Foot well-perfused. Derrel NipMobilize  Leo Fray, MD 09/21/2016 9:58 AM

## 2016-09-21 NOTE — Progress Notes (Signed)
Patient to transfer to 2W09 report given to receiving nurse Jan, all questions answered at this time.  Pt. VSS with no s/s of distress noted.  Patient stable at transfer.

## 2016-09-22 ENCOUNTER — Inpatient Hospital Stay (HOSPITAL_COMMUNITY): Payer: Medicare Other

## 2016-09-22 DIAGNOSIS — I739 Peripheral vascular disease, unspecified: Secondary | ICD-10-CM

## 2016-09-22 LAB — URINE CULTURE: Culture: NO GROWTH

## 2016-09-22 NOTE — Progress Notes (Addendum)
VASCULAR LAB PRELIMINARY  ARTERIAL  ABI completed:    RIGHT    LEFT    PRESSURE WAVEFORM  PRESSURE WAVEFORM  BRACHIAL 110 Triphasic BRACHIAL 127 Triphasic  AT 39 Severely Dampened Monophasic AT 105 Monophasic  PT 68 Dampened Monophasic PT 103 Dampened Monophasic    RIGHT LEFT  ABI 0.54 0.83   Right ABI indicates a moderate  Reduction in arterial flow at rest. Left ABI indicates a mid reduction in arterial flow at rest.  Christopher Pena, RVS 09/22/2016 11:30 PM

## 2016-09-22 NOTE — Progress Notes (Signed)
Subjective: Interval History: none.. Less discomfort than yesterday. Has not done much walking  Objective: Vital signs in last 24 hours: Temp:  [98.9 F (37.2 C)-100 F (37.8 C)] 98.9 F (37.2 C) (02/04 0504) Pulse Rate:  [86-91] 86 (02/04 0504) Resp:  [20] 20 (02/03 2011) BP: (96)/(54) 96/54 (02/03 2011) SpO2:  [96 %] 96 % (02/04 0504)  Intake/Output from previous day: 02/03 0701 - 02/04 0700 In: -  Out: 100 [Urine:100] Intake/Output this shift: No intake/output data recorded.  Wounds all healing well. 2-3+ dorsalis pedis pulse  Lab Results:  Recent Labs  09/20/16 0915 09/21/16 0250  WBC 11.5* 10.0  HGB 14.7 10.9*  HCT 44.4 32.6*  PLT 255 179   BMET  Recent Labs  09/20/16 0915 09/21/16 0250  NA 139 136  K 3.4* 4.3  CL 102 102  CO2 24 28  GLUCOSE 122* 102*  BUN 18 11  CREATININE 1.01 0.86  CALCIUM 9.2 8.3*    Studies/Results: Dg Chest Port 1 View  Result Date: 09/21/2016 CLINICAL DATA:  Productive cough, fever. EXAM: PORTABLE CHEST 1 VIEW COMPARISON:  Radiographs of Jan 02, 2016. FINDINGS: The heart size and mediastinal contours are within normal limits. Both lungs are clear. No pneumothorax or pleural effusion is noted. The visualized skeletal structures are unremarkable. IMPRESSION: No acute cardiopulmonary abnormality seen. Electronically Signed   By: Lupita RaiderJames  Green Jr, M.D.   On: 09/21/2016 09:07   Anti-infectives: Anti-infectives    Start     Dose/Rate Route Frequency Ordered Stop   09/20/16 2200  cefUROXime (ZINACEF) 1.5 g in dextrose 5 % 50 mL IVPB     1.5 g 100 mL/hr over 30 Minutes Intravenous Every 12 hours 09/20/16 1838 09/21/16 1534   09/20/16 0901  cefUROXime (ZINACEF) 1.5 g in dextrose 5 % 50 mL IVPB     1.5 g 100 mL/hr over 30 Minutes Intravenous 30 min pre-op 09/20/16 0901 09/20/16 1127      Assessment/Plan: s/p Procedure(s): REDO BYPASS GRAFT FEMORAL-ANTERIOR TIBIAL ARTERY (Left) Stable overall. Mobilize. Possible home tomorrow   LOS: 2 days   Christopher Pena 09/22/2016, 11:57 AM

## 2016-09-23 ENCOUNTER — Encounter (HOSPITAL_COMMUNITY): Payer: Self-pay | Admitting: Surgery

## 2016-09-23 MED ORDER — ATORVASTATIN CALCIUM 10 MG PO TABS: 10.0000 mg | ORAL_TABLET | Freq: Every day | ORAL | 11 refills | Status: DC

## 2016-09-23 MED ORDER — MUPIROCIN 2 % EX OINT
1.0000 "application " | TOPICAL_OINTMENT | Freq: Two times a day (BID) | CUTANEOUS | Status: DC
  Administered 2016-09-23: 1 via NASAL

## 2016-09-23 MED ORDER — CLOPIDOGREL BISULFATE 75 MG PO TABS: 75.0000 mg | ORAL_TABLET | Freq: Every day | ORAL | 11 refills | Status: DC

## 2016-09-23 MED ORDER — OXYCODONE HCL 15 MG PO TABS: 15.0000 mg | ORAL_TABLET | ORAL | 0 refills | Status: DC | PRN

## 2016-09-23 MED ORDER — CHLORHEXIDINE GLUCONATE CLOTH 2 % EX PADS
6.0000 | MEDICATED_PAD | Freq: Every day | CUTANEOUS | Status: DC
  Administered 2016-09-23: 6 via TOPICAL

## 2016-09-23 MED ORDER — BUDESONIDE 0.25 MG/2ML IN SUSP: 0.2500 mg | Freq: Two times a day (BID) | RESPIRATORY_TRACT | Status: DC | PRN

## 2016-09-23 NOTE — Progress Notes (Signed)
  [facility] Progress Note  Subjective  - POD #3  More comfortable today. Didn't walk too much this weekend.   Objective Vitals:   09/22/16 2107 09/23/16 0437  BP: 136/61 (!) 107/59  Pulse: 91 78  Resp: 16 16  Temp: 98.7 F (37.1 C) 98.5 F (36.9 C)    Intake/Output Summary (Last 24 hours) at 09/23/16 0736 Last data filed at 09/23/16 0438  Gross per 24 hour  Intake          6868.33 ml  Output              940 ml  Net          5928.33 ml   Left groin and below knee incisions healing well. Some left leg swelling. 2+ left DP pulse.  Dry eschar to tip of left great toe and left 3rd toes.   Assessment/Planning: 71 y.o. male is s/p: redo bypass femoral-anterior tibial artery bypass  3 Days Post-Op   Advised elevation of left leg for swelling. D/c IVF.  Patient wants to work again with therapy today. Will have PT eval and possibly discharge home this afternoon. Patient doesn't think he wants home health services as he will be going to stay with daughter.   Raymond GurneyKimberly A Asar Evilsizer 09/23/2016 7:36 AM --  Laboratory CBC    Component Value Date/Time   WBC 10.0 09/21/2016 0250   HGB 10.9 (L) 09/21/2016 0250   HCT 32.6 (L) 09/21/2016 0250   PLT 179 09/21/2016 0250    BMET    Component Value Date/Time   NA 136 09/21/2016 0250   K 4.3 09/21/2016 0250   CL 102 09/21/2016 0250   CO2 28 09/21/2016 0250   GLUCOSE 102 (H) 09/21/2016 0250   BUN 11 09/21/2016 0250   CREATININE 0.86 09/21/2016 0250   CREATININE 0.69 05/11/2014 1533   CALCIUM 8.3 (L) 09/21/2016 0250   GFRNONAA >60 09/21/2016 0250   GFRNONAA >89 04/19/2014 1418   GFRAA >60 09/21/2016 0250   GFRAA >89 04/19/2014 1418    COAG Lab Results  Component Value Date   INR 1.02 09/20/2016   INR 1.02 05/21/2016   INR 1.11 12/06/2015   No results found for: PTT  Antibiotics Anti-infectives    Start     Dose/Rate Route Frequency Ordered Stop   09/20/16 2200  cefUROXime (ZINACEF) 1.5 g in  dextrose 5 % 50 mL IVPB     1.5 g 100 mL/hr over 30 Minutes Intravenous Every 12 hours 09/20/16 1838 09/21/16 1534   09/20/16 0901  cefUROXime (ZINACEF) 1.5 g in dextrose 5 % 50 mL IVPB     1.5 g 100 mL/hr over 30 Minutes Intravenous 30 min pre-op 09/20/16 0901 09/20/16 1127       Maris BergerKimberly Ad Guttman, PA-C [facility] Office: [phone] Pager: [phone] 09/23/2016 7:36 AM

## 2016-09-23 NOTE — Evaluation (Signed)
Occupational Therapy Evaluation Patient Details Name: Christopher GageDavid H Shimer MRN: [id number] DOB: 10/31/1945 Today's Date: 09/23/2016    History of Present Illness Pt is a 71 y/o male s/p L femoral to anterior tibial bypass. PMH including but not limited to PAD, chronic LBP, COPD, HTN and tobacco use.    Clinical Impression   Pt admitted as above, currently requires intermittent supervision to Min guard assist for safety. He will benefit from 3:1 at d/c for home use to assist with increased independence/safety. Pt completed grooming and bathing sitting EOB and donned/doffed sock R foot. He did have some drainage from incision site while ambulating and RN was notified and pain meds requested. Pt denies any further acute OT needs and states that he will have PRN family assist from daughters' at d/c.    Follow Up Recommendations  No OT follow up;Supervision - Intermittent    Equipment Recommendations  3 in 1 bedside commode    Recommendations for Other Services       Precautions / Restrictions Precautions Precautions: Fall Restrictions Weight Bearing Restrictions: No      Mobility Bed Mobility Overal bed mobility: Modified Independent Bed Mobility: Supine to Sit;Sit to Supine     Supine to sit: Modified independent (Device/Increase time) Sit to supine: Modified independent (Device/Increase time)   General bed mobility comments: increased time  Transfers Overall transfer level: Needs assistance Equipment used: Rolling walker (2 wheeled) Transfers: Sit to/from UGI CorporationStand;Stand Pivot Transfers Sit to Stand: Min guard Stand pivot transfers: Min guard       General transfer comment: increased time, VC for hand placement, safety/sequencing    Balance Overall balance assessment: Needs assistance Sitting-balance support: Feet supported;No upper extremity supported Sitting balance-Leahy Scale: Good     Standing balance support: During functional activity;Bilateral upper extremity  supported Standing balance-Leahy Scale: Fair                              ADL Overall ADL's : Needs assistance/impaired Eating/Feeding: Bed level;Set up   Grooming: Wash/dry hands;Wash/dry face;Oral care;Applying deodorant;Sitting;Set up Grooming Details (indicate cue type and reason): Performed sitting EOB today Upper Body Bathing: Set up;Sitting   Lower Body Bathing: Set up;Sitting/lateral leans Lower Body Bathing Details (indicate cue type and reason): Pt able to bring/bend RLE to reach foot - did not wash LLE Upper Body Dressing : Set up;Sitting   Lower Body Dressing: Sit to/from stand;Min guard Lower Body Dressing Details (indicate cue type and reason): Pt donned sock RLE sitting EOB after set up. He requires Min guard assist during sit to stand for safety Toilet Transfer: Min guard;BSC;RW;Ambulation;Grab bars StatisticianToilet Transfer Details (indicate cue type and reason): Increased time, Min guard assist for safety. Pt should benefit from 3:1 at home Toileting- ArchitectClothing Manipulation and Hygiene: Min guard;Sit to/from stand       Functional mobility during ADLs: Supervision/safety;Min guard;Rolling walker General ADL Comments: Pt was educated in Role of OT/eval. Then he participated in ADL retraining session to consist of bed mobility, functional transfers, functional mobility from EOB into bathroom and on/off toilet. He will benefit from 3:1 for at d/c for home use (his or his daughters house) to assist with increased independence. Pt completed grooming and bathing sitting EOB and donned/doffed sock R foot. He did have some drainage from incision site while ambulating and RN was notified and pain meds requested.     Vision  Wears glasses for reading only. No change from baseline  Perception     Praxis      Pertinent Vitals/Pain Pain Assessment: 0-10 Pain Score: 5  Pain Location: Incision area L LE Pain Descriptors / Indicators: Sore;Aching Pain Intervention(s):  Limited activity within patient's tolerance;Monitored during session;Patient requesting pain meds-RN notified;Repositioned     Hand Dominance Right   Extremity/Trunk Assessment Upper Extremity Assessment Upper Extremity Assessment: Overall WFL for tasks assessed   Lower Extremity Assessment Lower Extremity Assessment: Defer to PT evaluation       Communication Communication Communication: No difficulties   Cognition Arousal/Alertness: Awake/alert Behavior During Therapy: WFL for tasks assessed/performed Overall Cognitive Status: Within Functional Limits for tasks assessed                     General Comments       Exercises       Shoulder Instructions      Home Living Family/patient expects to be discharged to:: Private residence Living Arrangements: Alone Available Help at Discharge: Family;Available 24 hours/day Type of Home: Apartment Home Access: Level entry     Home Layout: One level     Bathroom Shower/Tub: Tub/shower unit;Walk-in shower (Walk in shower at [employer]. Going here after d/c)   Bathroom Toilet: Standard     Home Equipment: Walker - 2 wheels;Grab bars - tub/shower;Grab bars - toilet;Shower seat (Has hand held shower head not installed yet)   Additional Comments: Pt able to stay at daughters after d/c, Her house is level entry home with bed/bath on first floor. Family available to get off work to provide supervision and assist      Prior Functioning/Environment Level of Independence: Independent        Comments: would occasionally use scooter at store, but if short distance would walk        OT Problem List:     OT Treatment/Interventions:      OT Goals(Current goals can be found in the care plan section) Acute Rehab OT Goals Patient Stated Goal: Go home later today if I'm able OT Goal Formulation: All assessment and education complete, DC therapy  OT Frequency:     Barriers to D/C:            Co-evaluation               End of Session Equipment Utilized During Treatment: Gait belt;Rolling walker Nurse Communication: Mobility status;Other (comment);Patient requests pain meds (Drainage from incision site LLE during functional mobility)  Activity Tolerance: Patient tolerated treatment well;Other (comment) (Limited ambulation 2* drainage from incision site. RN aware) Patient left: in bed;with call bell/phone within reach;with bed alarm set;with nursing/sitter in room   Time: [id number] OT Time Calculation (min): 44 min Charges:  OT General Charges $OT Visit: 1 Procedure OT Evaluation $OT Eval Low Complexity: 1 Procedure OT Treatments $Self Care/Home Management : 23-37 mins G-Codes:    Alm Bustard, OTR/L 09/23/2016, 9:03 AM

## 2016-09-23 NOTE — Progress Notes (Signed)
Physical Therapy Treatment Patient Details Name: Christopher Pena MRN: [id number] DOB: 05-02-1946 Today's Date: 09/23/2016    History of Present Illness Pt is a 71 y/o male s/p L femoral to anterior tibial bypass. PMH including but not limited to PAD, chronic LBP, COPD, HTN and tobacco use.     PT Comments    Pt moving well and at supervision level. Pt with significant improvement in ambulation tolerance this date. Pt safe to d/c home with daughter once medically stable.  Follow Up Recommendations  Home health PT;Supervision/Assistance - 24 hour     Equipment Recommendations  3in1 (PT);Rolling walker with 5" wheels    Recommendations for Other Services       Precautions / Restrictions Precautions Precautions: Fall Restrictions Weight Bearing Restrictions: No    Mobility  Bed Mobility Overal bed mobility: Modified Independent Bed Mobility: Supine to Sit;Sit to Supine     Supine to sit: Modified independent (Device/Increase time) Sit to supine: Modified independent (Device/Increase time)   General bed mobility comments: hob elevated, use of bed rail, pt sleeps in a recliner due to back condition  Transfers Overall transfer level: Needs assistance Equipment used: Rolling walker (2 wheeled) Transfers: Sit to/from Stand Sit to Stand: Supervision         General transfer comment: increased time but good technique  Ambulation/Gait Ambulation/Gait assistance: Supervision Ambulation Distance (Feet): 175 Feet Assistive device: Rolling walker (2 wheeled) Gait Pattern/deviations: Step-through pattern;Decreased stride length;Decreased step length - left;Decreased stance time - left Gait velocity: slow Gait velocity interpretation: Below normal speed for age/gender General Gait Details: started with step to gait pattern with increased distance pt able to transition to more reciprocal gait pattern   Stairs            Wheelchair Mobility    Modified Rankin (Stroke  Patients Only)       Balance Overall balance assessment: Needs assistance Sitting-balance support: Feet supported;No upper extremity supported Sitting balance-Leahy Scale: Good     Standing balance support: During functional activity;Bilateral upper extremity supported Standing balance-Leahy Scale: Fair Standing balance comment: pt reliant on bilateral UEs on RW; participate in lateral weight shifting to improve weight bearing on L LE                    Cognition Arousal/Alertness: Awake/alert Behavior During Therapy: [facility] for tasks assessed/performed                        Exercises General Exercises - Lower Extremity Ankle Circles/Pumps: AROM;Both;10 reps;Supine Quad Sets: AROM;Left;10 reps;Supine Heel Slides: AROM;Left;10 reps;Supine    General Comments General comments (skin integrity, edema, etc.): pt assisted to commode with supervision, pt indep with hygiene      Pertinent Vitals/Pain Pain Assessment: 0-10 Pain Score: 5  Pain Location: Incision area L LE Pain Descriptors / Indicators: Sore;Aching Pain Intervention(s): Monitored during session    Home Living                      Prior Function            PT Goals (current goals can now be found in the care plan section) Acute Rehab PT Goals Patient Stated Goal: go home today Progress towards PT goals: Progressing toward goals    Frequency    Min 3X/week      PT Plan Current plan remains appropriate    Co-evaluation  End of Session Equipment Utilized During Treatment: Gait belt Activity Tolerance: Patient tolerated treatment well Patient left: in chair;with call bell/phone within reach     Time: 1400-1423 PT Time Calculation (min) (ACUTE ONLY): 23 min  Charges:  $Gait Training: 8-22 mins $Therapeutic Exercise: 8-22 mins                    G Codes:      Harlow Carrizales M Alica Shellhammer 09/23/2016, 3:06 PM   Lewis ShockAshly Norrin Shreffler, PT, DPT Pager #: [phone] Office #:  [phone]

## 2016-09-24 NOTE — Care Management Note (Signed)
Case Management Note Donn PieriniKristi Arrie Zuercher RN, BSN Unit 2W-Case Manager [phone]  Patient Details  Name: Felicita GageDavid H Chard MRN: [id number] Date of Birth: Jan 09, 1946  Subjective/Objective:  Pt admitted s/p redo bypass femoral-anterior tibial artery bypass                Action/Plan: PTA pt lived at home- plan to return home- DME order placed for RW- [facility]- RW delivered to room prior to discharge- in speaking with pt - pt also wanted a 3n1- however was getting ready to leave and didn't want to wait- [facility]- [facility] placed and [facility]   Expected Discharge Date:  09/23/16               Expected Discharge Plan:  Home/Self Care  In-House Referral:     Discharge planning Services  CM Consult  Post Acute Care Choice:  Durable Medical Equipment Choice offered to:  Patient  DME Arranged:  3-N-1, Walker rolling DME Agency:  [facility]  [facility]:     Status of Service:  Completed, signed off  If discussed at Long Length of Stay Meetings, dates discussed:    Additional Comments:  Darrold SpanWebster, Jaelyn Bourgoin Hall, RN 09/24/2016, 11:18 AM

## 2016-09-25 ENCOUNTER — Telehealth: Payer: Self-pay

## 2016-09-25 NOTE — Telephone Encounter (Signed)
phone call from pt.  Reported swelling in left leg and foot, and redness around the lower leg incision.  reported "its not worse, but it isn't any better."  `Stated had some "numbness and discoloration of the toes, left foot, mid-day", but the symptoms have improved.  Stated able to move foot and toes without difficulty.  Reported the toes (L) foot are slightly cool, but the foot feels warm.  Reported has been elevating the left leg above level of heart, to help the swelling.  Denied pain in toes. Stated "incision is sore, but nothing out of the ordinary."  Encouraged to continue to monitor symptoms, elevate legs above level of heart at intervals, and walk intermittently.  Encouraged to call and report any concerns or worsening of symptoms.  Verb. Understanding.  Has f/u appt. with Dr. Myra GianottiBrabham on 2/12.

## 2016-09-26 LAB — CULTURE, BLOOD (ROUTINE X 2)
Culture: NO GROWTH
Culture: NO GROWTH

## 2016-09-26 NOTE — Discharge Summary (Signed)
[facility] Discharge Summary  Christopher Pena [birth year] 71 y.o. male  [id number]  Admission Date: 09/20/2016  Discharge Date: 09/23/2016  Physician: Durene CalWells Brabham, MD  Admission Diagnosis: Peripheral vascular disease with bilateral lower extremity claudication I70.213  HPI:   This is a 71 y.o. male who is status post left femoral to anterior tibial bypass graft on 12/10/2015. This was done for a wound on his left great toe. He has a history of a right femoral to below-knee popliteal bypass graft with vein which has occluded. He was last seen on 04/15/2016 and his bypass graft was patent.On 05/20/2016 he came back in the office without a pulse in his bypass graft. He stated that have been going on for approximately 3 weeks. He had also developed a wound on his great toe. On 05/21/2016 he was taken to the operating room for a thrombectomy of his superficial femoral anterior tibial bypass graft. I was able to get the bypass graft open, however the vein appeared to be sclerotic. I performed angioplasty blindly in the operating room. The following day he continued to have a pulse within the graft and therefore I had him taken to the Angio suite for arteriogram and additional balloon angioplasty with a 4 mm balloon. He was discharged home on Plavix.  He has developed worsening pain in his left foot and a shorter distance claudication over the past 5 days.  He now has an ulcer on the first and third toe which have been present for several months but look a little worse.  The pain in his foot is also worse on the left.  Hospital Course:  The patient was admitted to the hospital and taken to the operating room on 09/20/2016 and underwent:  #1  Left femoral to anterior tibial bypass with 6mm ringed Propatent PTFE #2  Re-do exposure of left femoral and anterior tibial arteries   The patient tolerated the procedure well and was transported to the PACU in stable condition.    POD 1: the patient had good doppler flow to his left foot. His incision was intact with mild oozing. His JP drain was removed due to clotted tubing with less than 10 cc output. He did have a fever, that was felt to be post-operative atelectasis. Given that he had a productive cough, a CXR was ordered. With a prosthetic bypass, blood cultures and UA were ordered. His UA, CXR and blood cultures were negative. He was transferred to the floor.   POD 2: He was stable overall and afebrile.   POD 3: He had a palpable left dorsalis pedis pulse. PT evaluated the patient and felt that he was appropriate for discharge home. His incision was clean and intact. He had chronic dry eschar to the tip of left great toe and left 3rd toe. He was discharged home on plavix.   CBC    Component Value Date/Time   WBC 10.0 09/21/2016 0250   RBC 3.41 (L) 09/21/2016 0250   HGB 10.9 (L) 09/21/2016 0250   HCT 32.6 (L) 09/21/2016 0250   PLT 179 09/21/2016 0250   MCV 95.6 09/21/2016 0250   MCH 32.0 09/21/2016 0250   MCHC 33.4 09/21/2016 0250   RDW 13.7 09/21/2016 0250   LYMPHSABS 1.5 08/14/2016 1429   MONOABS 0.7 08/14/2016 1429   EOSABS 0.1 08/14/2016 1429   BASOSABS 0.1 08/14/2016 1429    BMET    Component Value Date/Time   NA 136 09/21/2016 0250   K 4.3 09/21/2016  0250   CL 102 09/21/2016 0250   CO2 28 09/21/2016 0250   GLUCOSE 102 (H) 09/21/2016 0250   BUN 11 09/21/2016 0250   CREATININE 0.86 09/21/2016 0250   CREATININE 0.69 05/11/2014 1533   CALCIUM 8.3 (L) 09/21/2016 0250   GFRNONAA >60 09/21/2016 0250   GFRNONAA >89 04/19/2014 1418   GFRAA >60 09/21/2016 0250   GFRAA >89 04/19/2014 1418     Discharge Instructions:   The patient is discharged to home with extensive instructions on wound care and progressive ambulation.  They are instructed not to drive or perform any heavy lifting until returning to see the physician in his office.  Discharge Instructions    Call MD for:  redness,  tenderness, or signs of infection (pain, swelling, bleeding, redness, odor or green/yellow discharge around incision site)    Complete by:  As directed    Call MD for:  severe or increased pain, loss or decreased feeling  in affected limb(s)    Complete by:  As directed    Call MD for:  temperature >100.5    Complete by:  As directed    Discharge wound care:    Complete by:  As directed    Wash wounds daily with soap and water and pat dry. Keep left leg elevated to reduce swelling.   Driving Restrictions    Complete by:  As directed    No driving for 2 weeks   Increase activity slowly    Complete by:  As directed    Walk with assistance use walker or cane as needed   Lifting restrictions    Complete by:  As directed    No lifting for 1 week   Resume previous diet    Complete by:  As directed       Discharge Diagnosis:  Peripheral vascular disease with bilateral lower extremity claudication I70.213  Secondary Diagnosis: Patient Active Problem List   Diagnosis Date Noted  . Physical exam 08/14/2016  . Femoral-popliteal bypass graft occlusion, left (HCC) 05/21/2016  . Allergic rhinitis 05/13/2016  . Protein-calorie malnutrition, severe (HCC) 04/08/2016  . Pain in joint, lower leg 10/17/2014  . Visit for wound check 02/21/2014  . Encounter for post surgical wound check 12/09/2013  . Wound drainage-Right medial leg 12/09/2013  . Peripheral vascular disease, unspecified 11/08/2013  . Leg edema, left, foot 06/22/2013  . Cellulitis of foot, left 06/09/2013  . Non-healing wound of lower extremity, lt great toe 06/09/2013  . Cigarette smoker 06/09/2013  . Claudication, lifestyle limiting 05/11/2013  . Unexplained weight loss 04/28/2013  . PAD (peripheral artery disease), PTA/stent IDEV & chocolate baloon 06/08/13, Previous PTA/Stent to Rt SFA 05/10/13 04/13/2013  . COPD II/III still smoking  04/13/2013  . HTN (hypertension) 04/13/2013  . Anxiety 04/13/2013  . Low back pain  04/13/2013   Past Medical History:  Diagnosis Date  . Anxiety   . Chronic low back pain    "degenerative spine dx'd ~ 7 yr ago" (06/08/2013)  . Claudication (HCC)   . Constipation due to pain medication   . COPD (chronic obstructive pulmonary disease) (HCC)   . Emphysema of lung (HCC)   . Hypertension    "moderately high; RX didn't help" (06/08/2013) - no longer on meds (as of 09/19/16)  . Neuromuscular disorder (HCC)    degen spine  . Non-healing wound of lower extremity 06/09/2013  . PAD (peripheral artery disease), PTA/stent IDEV & chocolate baloon 06/08/13 04/13/2013   Severely reduced ABI's of 0.3  bilaterally   . Shortness of breath    w/ exertion  . Tobacco abuse   . Tobacco use 06/09/2013  . Unexplained weight loss      Allergies as of 09/23/2016      Reactions   Clindamycin/lincomycin Diarrhea      Medication List    STOP taking these medications   aspirin EC 81 MG tablet     TAKE these medications   atorvastatin 10 MG tablet Commonly known as:  LIPITOR Take 1 tablet (10 mg total) by mouth daily.   budesonide 0.25 MG/2ML nebulizer solution Commonly known as:  PULMICORT Take 2 mLs (0.25 mg total) by nebulization every 12 (twelve) hours as needed (shortness or breath). Use one vial twice daily What changed:  how much to take  how to take this  when to take this  reasons to take this   clopidogrel 75 MG tablet Commonly known as:  PLAVIX Take 1 tablet (75 mg total) by mouth daily.   oxyCODONE 15 MG immediate release tablet Commonly known as:  ROXICODONE Take 1 tablet (15 mg total) by mouth every 4 (four) hours as needed for pain.       Oxycodone #30 No Refill  Disposition: Home  Patient's condition: is Good  Follow up: 1. Dr. Myra Gianotti in 2 weeks   Maris Berger, PA-C [facility] [phone] 09/26/2016  10:08 AM  - For VQI Registry use --- Instructions: Press F2 to tab through selections.  Delete question if not  applicable.   Post-op:  Wound infection: No  Graft infection: No  Transfusion: No   New Arrhythmia: No Ipsilateral amputation: No, [ ]  Minor, [ ]  BKA, [ ]  AKA Discharge patency: [x ] Primary, [ ]  Primary assisted, [ ]  Secondary, [ ]  Occluded Patency judged by: [ ]  Dopper only, [ ]  Palpable graft pulse, [x ] Palpable distal pulse, [ ]  ABI inc. > 0.15, [ ]  Duplex Discharge ABI: R 0.54, L 0.83 D/C Ambulatory Status: Ambulatory with Assistance  Complications: MI: No, [ ]  Troponin only, [ ]  EKG or Clinical CHF: No Resp failure:No, [ ]  Pneumonia, [ ]  Ventilator Chg in renal function: No, [ ]  Inc. Cr > 0.5, [ ]  Temp. Dialysis, [ ]  Permanent dialysis Stroke: No, [ ]  Minor, [ ]  Major Return to OR: No  Reason for return to OR: [ ]  Bleeding, [ ]  Infection, [ ]  Thrombosis, [ ]  Revision  Discharge medications: Statin use:  yes ASA use:  No, was discontinued and started on plavix.  Plavix use:  yes Beta blocker use: no Coumadin use: no

## 2016-09-30 ENCOUNTER — Encounter: Payer: Self-pay | Admitting: Surgery

## 2016-09-30 ENCOUNTER — Ambulatory Visit (INDEPENDENT_AMBULATORY_CARE_PROVIDER_SITE_OTHER): Payer: Self-pay | Admitting: Surgery

## 2016-09-30 VITALS — BP 120/75 | HR 92 | Temp 98.3°F | Resp 18 | Ht 67.0 in | Wt 117.0 lb

## 2016-09-30 DIAGNOSIS — I739 Peripheral vascular disease, unspecified: Secondary | ICD-10-CM

## 2016-09-30 DIAGNOSIS — I70445 Atherosclerosis of autologous vein bypass graft(s) of the left leg with ulceration of other part of foot: Secondary | ICD-10-CM

## 2016-09-30 MED ORDER — CEPHALEXIN 500 MG PO CAPS: 500.0000 mg | ORAL_CAPSULE | Freq: Three times a day (TID) | ORAL | 0 refills | Status: DC

## 2016-09-30 NOTE — Progress Notes (Signed)
Patient name: Christopher Pena MRN: [id number] DOB: 05-May-1946 Sex: male  REASON FOR VISIT:     post-op  HISTORY OF PRESENT ILLNESS:   Christopher Muldrew [name] a 71 y.o.malewho is status post left femoral to anterior tibial bypass graft on 12/10/2015. This was done for a wound on his left great toe. He has a history of a right femoral to below-knee popliteal bypass graft with vein which has occluded. He was last seen on 04/15/2016 and his bypass graft was patent.On 05/20/2016 he came back in the office without a pulse in his bypass graft. He stated that have been going on for approximately 3 weeks. He had also developed a wound on his great toe. On 05/21/2016 he was taken to the operating room for a thrombectomy of his superficial femoral anterior tibial bypass graft. I was able to get the bypass graft open, however the vein appeared to be sclerotic. I performed angioplasty blindly in the operating room. The following day he continued to have a pulse within the graft and therefore I had him taken to the Angio suite for arteriogram and additional balloon angioplasty with a 4 mm balloon. He was discharged home on Plavix  He was scheduled for angiography to evaluate his bypass graft, however when he arrived it was found to be occluded.  Because the vein was of such poor quality, I elected to redo his bypass graft, this time using PTFE down from the common femoral to the anterior tibial artery due to 2018.  I did place retention sutures and the lateral lower leg because I was worried about swelling and tension in this area.  He is back today stating that he is walking better.  His pain appears to be controlled.  CURRENT MEDICATIONS:    Current Outpatient Prescriptions  Medication Sig Dispense Refill  . atorvastatin (LIPITOR) 10 MG tablet Take 1 tablet (10 mg total) by mouth daily. 30 tablet 11  . budesonide (PULMICORT) 0.25 MG/2ML nebulizer solution Take 2  mLs (0.25 mg total) by nebulization every 12 (twelve) hours as needed (shortness or breath). Use one vial twice daily    . clopidogrel (PLAVIX) 75 MG tablet Take 1 tablet (75 mg total) by mouth daily. 30 tablet 11  . oxyCODONE (ROXICODONE) 15 MG immediate release tablet Take 1 tablet (15 mg total) by mouth every 4 (four) hours as needed for pain. 30 tablet 0   No current facility-administered medications for this visit.     REVIEW OF SYSTEMS:   [X]  denotes positive finding, [ ]  denotes negative finding Cardiac  Comments:  Chest pain or chest pressure:    Shortness of breath upon exertion:    Short of breath when lying flat:    Irregular heart rhythm:    Constitutional    Fever or chills:      PHYSICAL EXAM:   Vitals:   09/30/16 0918  BP: 120/75  Pulse: 92  Resp: 18  Temp: 98.3 F (36.8 C)  SpO2: 97%  Weight: 117 lb (53.1 kg)  Height: 5\' 7"  (1.702 m)    GENERAL: The patient is a well-nourished male, in no acute distress. The vital signs are documented above. CARDIOVASCULAR: There is a regular rate and rhythm. PULMONARY: Non-labored respirations Palpable left dorsalis pedis pulse.  There is erythema around the retention sutures.  STUDIES:   None   MEDICAL ISSUES:   Status post left femoral to anterior tibial bypass graft with 6 mm propatent PTFE.  Again the patient was counseled  on the necessity of smoking cessation.  He will need to remain on aspirin and Plavix indefinitely for the duration of his bypass graft.  I am placing him on Keflex500 mg 3 times a day for 10 days for some erythema around the lateral incision on the lower leg.  I'm a little afraid to take his retention sutures out as this area is still under some tension, however the incision is closed.  I have scheduled to follow up in 2 weeks to assess the erythema around his leg as well as for retention suture removal and Steri-Strip placement over the wound.  Christopher Pena Christopher Bade, MD [facility] Tel [phone] Pager [phone]) [phone]

## 2016-10-08 ENCOUNTER — Encounter: Payer: Self-pay | Admitting: Surgery

## 2016-10-14 ENCOUNTER — Ambulatory Visit (INDEPENDENT_AMBULATORY_CARE_PROVIDER_SITE_OTHER): Payer: Self-pay | Admitting: Physician Assistant

## 2016-10-14 ENCOUNTER — Encounter: Payer: Medicare Other | Admitting: Surgery

## 2016-10-14 VITALS — BP 118/74 | HR 99 | Temp 97.5°F | Resp 20 | Ht 67.0 in | Wt 122.0 lb

## 2016-10-14 DIAGNOSIS — I70445 Atherosclerosis of autologous vein bypass graft(s) of the left leg with ulceration of other part of foot: Secondary | ICD-10-CM

## 2016-10-14 IMAGING — CR DG CHEST 2V
2 series · 2 of 2 positions shown · non-contrast
Comparison: 05/04/2013.

CLINICAL DATA: Shortness of breath.

EXAM:
CHEST  2 VIEW

[w chest pa]
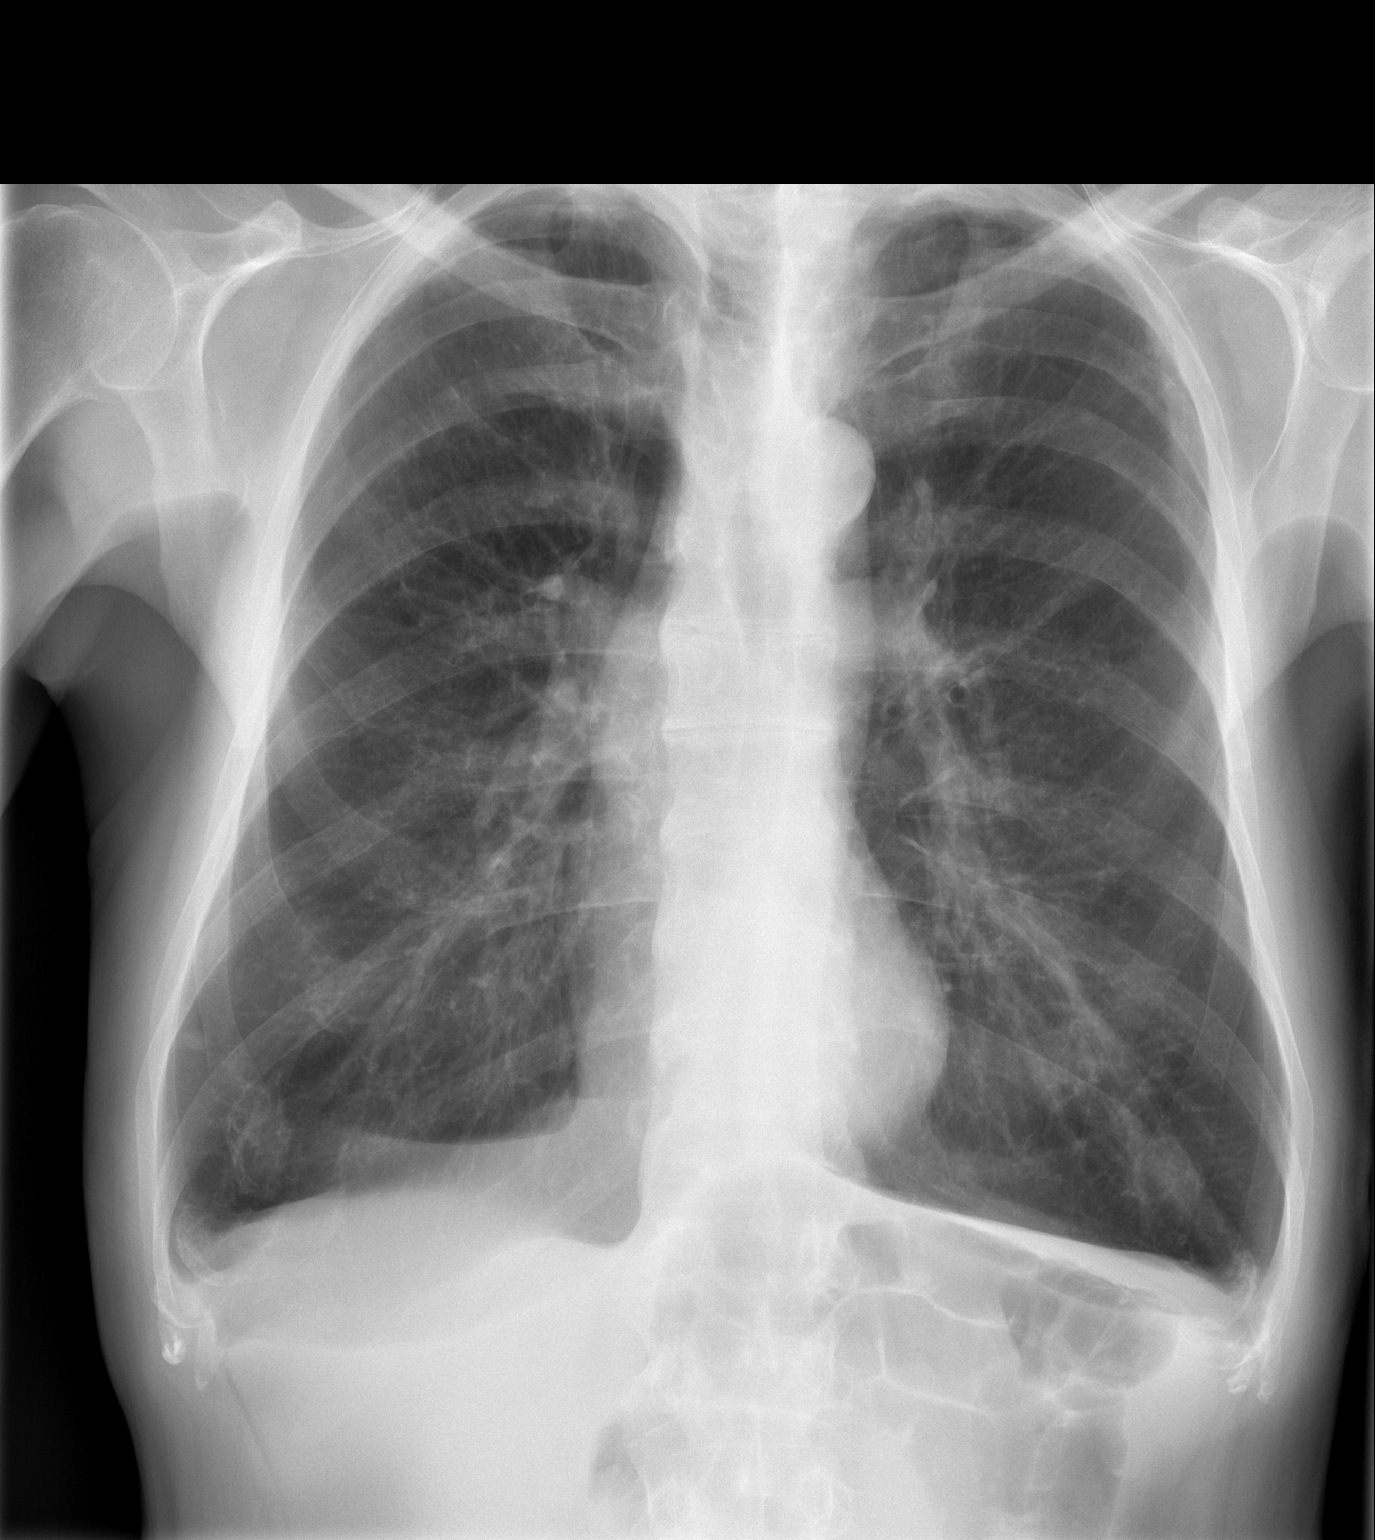

[w chest lat]
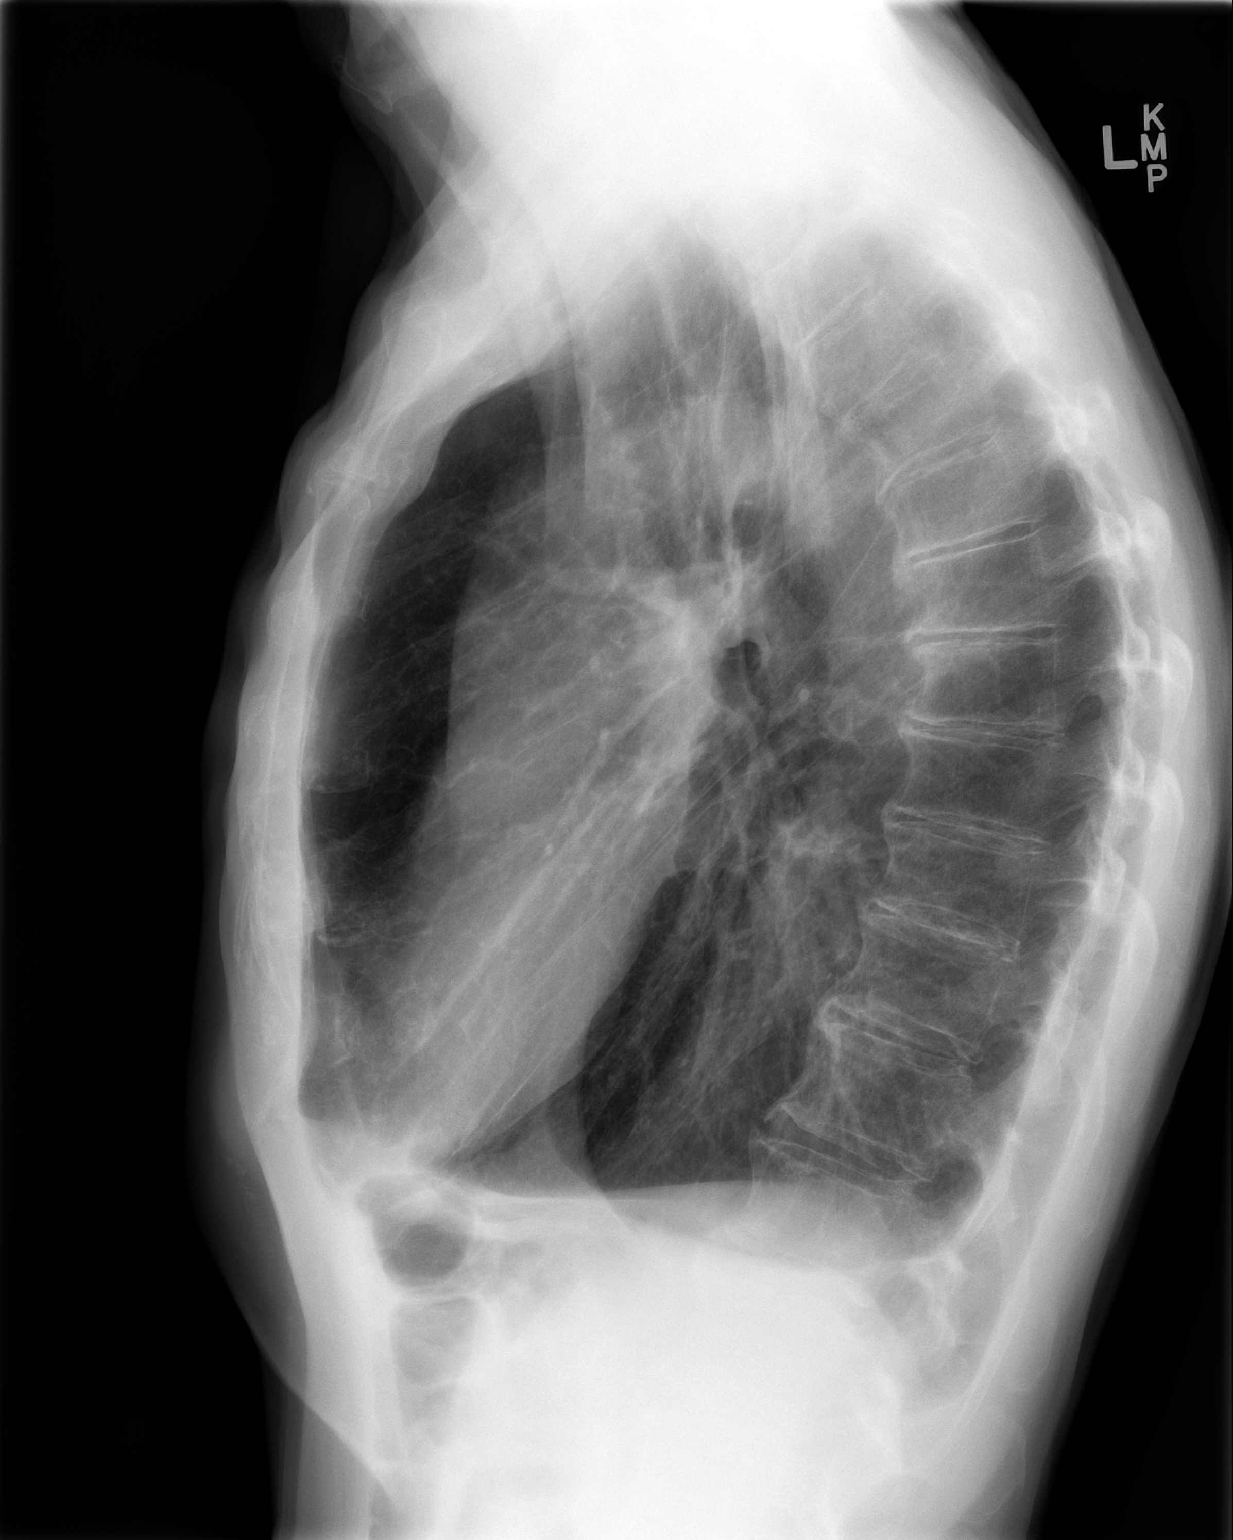

[2 of 2 positions shown; findings below may reference images not displayed]

FINDINGS: Mediastinum hilar structures are normal. Bilateral pleural
parenchymal thickening noted consistent with scarring. Tiny calcific
nodules suggesting prior granulomas disease. Changes suggesting COPD
noted. Heart size normal.
IMPRESSION: No acute cardiopulmonary disease. Stable changes of COPD, calcified
granulomas disease, and pleural parenchymal scarring.

## 2016-10-14 NOTE — Progress Notes (Signed)
  POST OPERATIVE OFFICE NOTE    CC:  F/u for surgery  HPI:  Christopher Pena a 71 y.o.malewho is status post left femoral to anterior tibial bypass graft on 12/10/2015. This was done for a wound on his left great toe. He has a history of a right femoral to below-knee popliteal bypass graft with vein which has occluded. He was last seen on 04/15/2016 and his bypass graft was patent.On 05/20/2016 he came back in the office without a pulse in his bypass graft. He stated that have been going on for approximately 3 weeks. He had also developed a wound on his great toe. On 05/21/2016 he was taken to the operating room for a thrombectomy of his superficial femoral anterior tibial bypass graft. I was able to get the bypass graft open, however the vein appeared to be sclerotic. I performed angioplasty blindly in the operating room. The following day he continued to have a pulse within the graft and therefore I had him taken to the Angio suite for arteriogram and additional balloon angioplasty with a 4 mm balloon. He was discharged home on Plavix  He was scheduled for angiography to evaluate his bypass graft, however when he arrived it was found to be occluded.  Because the vein was of such poor quality, I elected to redo his bypass graft, this time using PTFE down from the common femoral to the anterior tibial artery due to 2018.  I did place retention sutures and the lateral lower leg because I was worried about swelling and tension in this area.  He is back today for left LE incision check and removal of retention stitches.  Allergies  Allergen Reactions  . Clindamycin/Lincomycin Diarrhea    Current Outpatient Prescriptions  Medication Sig Dispense Refill  . atorvastatin (LIPITOR) 10 MG tablet Take 1 tablet (10 mg total) by mouth daily. 30 tablet 11  . budesonide (PULMICORT) 0.25 MG/2ML nebulizer solution Take 2 mLs (0.25 mg total) by nebulization every 12 (twelve) hours as needed  (shortness or breath). Use one vial twice daily    . clopidogrel (PLAVIX) 75 MG tablet Take 1 tablet (75 mg total) by mouth daily. 30 tablet 11  . oxyCODONE (ROXICODONE) 15 MG immediate release tablet Take 1 tablet (15 mg total) by mouth every 4 (four) hours as needed for pain. 30 tablet 0   No current facility-administered medications for this visit.      ROS:  See HPI  Physical Exam:  Vitals:   10/14/16 1305  BP: 118/74  Pulse: 99  Resp: 20  Temp: 97.5 F (36.4 C)    Incision:  Healing well without erythema.  Minimal edema with soft compartments.  Palpable graft pulse left fem-tib. Extremities:   B LE active range of motion intact   Assessment/Plan:  This is a 71 y.o. male who is s/p:  left femoral to anterior tibial bypass graft with 6 mm propatent PTFE.  No erythema at incision and he has completed 10 days of Keflex PO.  Sutures were removed and patient tolerated this well.  He will f/u for by pass duplex and repeat ABI's on 12/16/2016.  We reviewed that if he develops rest pain, loss of motor or more non healing wounds he should call us sooner.     Thomasena EdisOLLINS, Angelamarie Avakian [facility] PA-C [facility] [phone]  Clinic MD:  Myra GianottiBrabham

## 2016-12-06 ENCOUNTER — Encounter: Payer: Self-pay | Admitting: Surgery

## 2016-12-16 ENCOUNTER — Encounter: Payer: Self-pay | Admitting: Family

## 2016-12-16 ENCOUNTER — Ambulatory Visit (INDEPENDENT_AMBULATORY_CARE_PROVIDER_SITE_OTHER)
Admission: RE | Admit: 2016-12-16 | Discharge: 2016-12-16 | Disposition: A | Discharge status: Home / Self Care | Payer: Medicare Other | Source: Ambulatory Visit | Attending: Surgery | Admitting: Surgery

## 2016-12-16 ENCOUNTER — Other Ambulatory Visit: Payer: Self-pay | Admitting: Surgery

## 2016-12-16 ENCOUNTER — Ambulatory Visit (HOSPITAL_COMMUNITY)
Admission: RE | Admit: 2016-12-16 | Discharge: 2016-12-16 | Disposition: A | Discharge status: Home / Self Care | Payer: Medicare Other | Source: Ambulatory Visit | Attending: Surgery | Admitting: Surgery

## 2016-12-16 ENCOUNTER — Ambulatory Visit (INDEPENDENT_AMBULATORY_CARE_PROVIDER_SITE_OTHER): Payer: Self-pay | Admitting: Family

## 2016-12-16 ENCOUNTER — Encounter (HOSPITAL_COMMUNITY): Payer: Medicare Other

## 2016-12-16 VITALS — BP 128/80 | HR 84 | Ht 67.0 in | Wt 132.0 lb

## 2016-12-16 DIAGNOSIS — I70345 Atherosclerosis of unspecified type of bypass graft(s) of the left leg with ulceration of other part of foot: Secondary | ICD-10-CM | POA: Diagnosis not present

## 2016-12-16 DIAGNOSIS — L97509 Non-pressure chronic ulcer of other part of unspecified foot with unspecified severity: Secondary | ICD-10-CM

## 2016-12-16 DIAGNOSIS — Z95828 Presence of other vascular implants and grafts: Secondary | ICD-10-CM

## 2016-12-16 DIAGNOSIS — Z87891 Personal history of nicotine dependence: Secondary | ICD-10-CM

## 2016-12-16 DIAGNOSIS — I70445 Atherosclerosis of autologous vein bypass graft(s) of the left leg with ulceration of other part of foot: Secondary | ICD-10-CM

## 2016-12-16 DIAGNOSIS — I714 Abdominal aortic aneurysm, without rupture, unspecified: Secondary | ICD-10-CM

## 2016-12-16 DIAGNOSIS — I70212 Atherosclerosis of native arteries of extremities with intermittent claudication, left leg: Secondary | ICD-10-CM

## 2016-12-16 NOTE — Progress Notes (Signed)
CC: Postoperative Visit, s/p left femoral to anterior tibial bypass graft redo   History of Present Illness  Christopher Pena is a 71 y.o. year old malewho is status post left femoral to anterior tibial bypass graft on 12/10/2015. This was done for a wound on his left great toe. He has a history of a right femoral to below-knee popliteal bypass graft with vein which has occluded. He was seen on 04/15/2016 and his bypass graft was patent.On 05/20/2016 he came back in the office without a pulse in his bypass graft. He stated that had been going on for approximately 3 weeks. He had also developed a wound on his great toe. On 05/21/2016 he was taken to the operating room for a thrombectomy of his superficial femoral anterior tibial bypass graft. Dr. Myra Gianotti was able to get the bypass graft open, however the vein appeared to be sclerotic. Dr. Myra Gianotti performed angioplasty blindly in the operating room. The following day the patient continued to have a pulse within the graft and therefore he was taken to the Angio suite for arteriogram and additional balloon angioplasty with a 4 mm balloon. He was discharged home on Plavix.  He was scheduled for angiography to evaluate his bypass graft, however when he arrived it was found to be occluded. Because the vein was of such poor quality, Dr. Myra Gianotti elected to redo his bypass graft, this time using PTFE down from the common femoral to the anterior tibial artery on 09-20-16. Dr. Myra Gianotti placed retention sutures at the lateral lower leg because he was worried about swelling and tension in this area.  He is back today for left LE follow up.  He has a tight feeling in his right calf after 4-5 minutes of walking, about 7 minutes of walking and his left calf feels slightly painful.  He has numbness at the anterior aspect of his right calf, states since right leg surgery.   He stopped smoking 11-13-16, states because he had too much trouble breathing. He  denies a hx of DM. The patient's wounds are healed.  The patient notes resolution of lower extremity symptoms.  The patient is able to complete their activities of daily living.    For VQI Use Only  PRE-ADM LIVING: Home  AMB STATUS: Ambulatory   Past Medical History:  Diagnosis Date  . Anxiety   . Chronic low back pain    "degenerative spine dx'd ~ 7 yr ago" (06/08/2013)  . Claudication (HCC)   . Constipation due to pain medication   . COPD (chronic obstructive pulmonary disease) (HCC)   . Emphysema of lung (HCC)   . Hypertension    "moderately high; RX didn't help" (06/08/2013) - no longer on meds (as of 09/19/16)  . Neuromuscular disorder (HCC)    degen spine  . Non-healing wound of lower extremity 06/09/2013  . PAD (peripheral artery disease), PTA/stent IDEV & chocolate baloon 06/08/13 04/13/2013   Severely reduced ABI's of 0.3 bilaterally   . Shortness of breath    w/ exertion  . Tobacco abuse   . Tobacco use 06/09/2013  . Unexplained weight loss     Past Surgical History:  Procedure Laterality Date  . ABDOMINAL AORTAGRAM  05/10/2013   Procedure: ABDOMINAL Ronny Flurry;  Surgeon: Runell Gess, MD;  Location: [facility] CATH LAB;  Service: Cardiovascular;;  . ABI     04/09/13; abnormal  . ANGIOPLASTY Left 05/21/2016   Procedure: ballon ANGIOPLASTY of femoral to anterior to tibial bypass graft;  Surgeon: Nada Libman, MD;  Location: [facility] OR;  Service: Vascular;  Laterality: Left;  . APPENDECTOMY  05/2001  . FEMORAL ARTERY STENT Right 05/10/13   2 stents Rt SFA  . FEMORAL ARTERY STENT Left 06/08/2013  . FEMORAL-POPLITEAL BYPASS GRAFT Right 11/12/2013   Procedure: RIGHT  FEMORAL-BELOW KNEE POPLITEAL ARTERY BYPASS GRAFT USING NON- REVERSE RIGHT GREATER SAPHENOUS VEIN.;  Surgeon: Nada Libman, MD;  Location: [facility] OR;  Service: Vascular;  Laterality: Right;  . FEMORAL-TIBIAL BYPASS GRAFT Left 12/08/2015   Procedure:  LEFT FEMORAL-ANTERIOR TIBIAL ARTERY BYPASS GRAFT;  Surgeon: Nada Libman, MD;  Location: [facility] OR;  Service: Vascular;  Laterality: Left;  . FEMORAL-TIBIAL BYPASS GRAFT Left 09/20/2016   Procedure: REDO BYPASS GRAFT FEMORAL-ANTERIOR TIBIAL ARTERY;  Surgeon: Nada Libman, MD;  Location: [facility] OR;  Service: Vascular;  Laterality: Left;  . LOWER EXTREMITY ANGIOGRAM Bilateral 05/10/2013   Procedure: LOWER EXTREMITY ANGIOGRAM;  Surgeon: Runell Gess, MD;  Location: [facility] CATH LAB;  Service: Cardiovascular;  Laterality: Bilateral;  . LOWER EXTREMITY ANGIOGRAM N/A 06/08/2013   Procedure: LOWER EXTREMITY ANGIOGRAM;  Surgeon: Runell Gess, MD;  Location: [facility] CATH LAB;  Service: Cardiovascular;  Laterality: N/A;  . LOWER EXTREMITY ANGIOGRAM N/A 10/28/2013   Procedure: LOWER EXTREMITY ANGIOGRAM;  Surgeon: Runell Gess, MD;  Location: [facility] CATH LAB;  Service: Cardiovascular;  Laterality: N/A;  . LOWER EXTREMITY ANGIOGRAM N/A 05/16/2014   Procedure: LOWER EXTREMITY ANGIOGRAM;  Surgeon: Runell Gess, MD;  Location: [facility] CATH LAB;  Service: Cardiovascular;  Laterality: N/A;  . PERCUTANEOUS STENT INTERVENTION Right 05/10/2013   Procedure: PERCUTANEOUS STENT INTERVENTION;  Surgeon: Runell Gess, MD;  Location: [facility] CATH LAB;  Service: Cardiovascular;  Laterality: Right;  rt sfa and popliteal stent x2  . PERIPHERAL VASCULAR CATHETERIZATION N/A 12/06/2015   Procedure: Abdominal Aortogram w/Lower Extremity;  Surgeon: Nada Libman, MD;  Location: [facility] INVASIVE CV LAB;  Service: Cardiovascular;  Laterality: N/A;  . PERIPHERAL VASCULAR CATHETERIZATION N/A 05/22/2016   Procedure: Abdominal Aortogram w/Lower Extremity;  Surgeon: Maeola Harman, MD;  Location: [facility] INVASIVE CV LAB;  Service: Cardiovascular;  Laterality: N/A;  . PERIPHERAL VASCULAR CATHETERIZATION N/A 09/17/2016   Procedure: Abdominal Aortogram w/Lower Extremity;  Surgeon: Nada Libman, MD;  Location: [facility] INVASIVE CV LAB;  Service: Cardiovascular;  Laterality: N/A;  . THROMBECTOMY FEMORAL ARTERY Left 05/21/2016     Procedure: THROMBECTOMY FEMORAL TO ANTERIOR TIBIAL BYPASS GRAFT;  Surgeon: Nada Libman, MD;  Location: [facility] OR;  Service: Vascular;  Laterality: Left;  Marland Kitchen VEIN HARVEST Left 12/08/2015   Procedure: USING NON REVERSE  LEFT GREATER SAPHENOUS VEIN;  Surgeon: Nada Libman, MD;  Location: [facility] OR;  Service: Vascular;  Laterality: Left;    Social History   Social History  . Marital status: Divorced    Spouse name: N/A  . Number of children: N/A  . Years of education: N/A   Occupational History  . former golf pro    Social History Main Topics  . Smoking status: Light Tobacco Smoker    Packs/day: 0.25    Years: 50.00    Types: Cigarettes    Start date: 07/03/1963  . Smokeless tobacco: Never Used     Comment: states "he is ready to quit"  . Alcohol use No     Comment: 06/08/2013 "quit 02/2007; drank my share before that though"  . Drug use: No  . Sexual activity: Yes   Other Topics Concern  . Not on file  Social History Narrative  . No narrative on file    Allergies  Allergen Reactions  . Clindamycin/Lincomycin Diarrhea    Current Outpatient Prescriptions on File Prior to Visit  Medication Sig Dispense Refill  . atorvastatin (LIPITOR) 10 MG tablet Take 1 tablet (10 mg total) by mouth daily. 30 tablet 11  . budesonide (PULMICORT) 0.25 MG/2ML nebulizer solution Take 2 mLs (0.25 mg total) by nebulization every 12 (twelve) hours as needed (shortness or breath). Use one vial twice daily    . clopidogrel (PLAVIX) 75 MG tablet Take 1 tablet (75 mg total) by mouth daily. 30 tablet 11  . oxyCODONE (ROXICODONE) 15 MG immediate release tablet Take 1 tablet (15 mg total) by mouth every 4 (four) hours as needed for pain. 30 tablet 0   No current facility-administered medications on file prior to visit.      Physical Examination  Vitals:   12/16/16 1054 12/16/16 1058  BP: 127/75 128/80  Pulse: 82 84  SpO2: 95%   Weight: 132 lb (59.9 kg)   Height:  (1.702 m)    Body mass  index is 20.67 kg/m.   DATA  12-16-16 Left Lower Extremity Arterial Duplex: Patent left LE bypass graft with no evidence of restenosis.  This is the first post-operative duplex exam.  12-16-16 ABI: Right: 0.50 (0.54, 09-22-16), waveforms: monophasic; TBI: dampened Left: 1.18 (0.83, 09-22-16), waveforms: biphasic; TBI: 0.54  11-27-15 AAA Duplex: 3.6 cm mid abdominal aortic aneurysm. Right CIA: 2.0 cm; Left CIA: 1.5 cm  09-17-16 Abdominal arteriogram Aortogram:  No significant renal artery stenosis.  No aortic stenosis.  Bilateral common and external iliac arteries are widely patent            Right Lower Extremity:  Not evaluated            Left Lower Extremity:  Left common femoral profunda femoral artery are patent.  The bypass graft is not visualized.  There is reconstitution of the anterior tibial and peroneal artery.  The anterior tibial is the dominant vessel across the ankle.   Bilateral femoral pulses: 2+ right, 1+ left; Right pedal pulses are not palpable, left DP and PT are 1+ palpable.  Left lower Eextremity: Incisions are healed, left great toe ulcer is healing, is dry, no gangrene.   Medical Decision Making  Christopher Pena is a 71 y.o. year old male who presents s/p redo bypass graft using PTFE down from the common femoral to the anterior tibial artery on 09-20-16. Prior to that he is s/p left femoral to anterior tibial bypass graft on 04/23/201, and thrombectomy of his superficial femoral anterior tibial bypass graft on 05-21-16.  The left LE bypass graft remains open with no evidence of restenosis; left ABI improved to 1.18  Compared to preoperatively at 0.83 on 09-22-16. Right ABI has declined slightly from 0.54 to 0.50.  He stopped smoking 33 days ago, stated reason was that he had too much trouble breathing.  He does not have DM.   The patient's bypass incisions have healed appropriately with resolution of pre-operative symptoms.  Will recheck small AAA by duplex at some point  soon.  I discussed in depth with the patient the nature of atherosclerosis, and emphasized the importance of maximal medical management including strict control of blood pressure, blood glucose, and lipid levels, obtaining regular exercise, and cessation of smoking.  The patient is aware that without maximal medical management the underlying atherosclerotic disease process will progress, limiting the benefit of any  interventions. The patient's surveillance will included ABI and bypass duplex studies which will be completed in: 3 months, at which time the patient will be re-evaluated.   I advised him to notify us if he[place] develops concerns re the circulation in his feet/legs.  I emphasized the importance of routine surveillance of the patient's bypass, as the vascular surgery literature emphasize the improved patency possible with assisted primary patency procedures versus secondary patency procedures. The patient agrees to participate in their maximal medical care and routine surveillance.  Thank you for allowing [place] to participate in this patient's care.  Khristian Seals, Carma Lair, RN, MSN, FNP-C [facility] [place] Office: [phone]  12/16/2016, 11:00 AM  Clinic MD: Myra Gianotti

## 2016-12-16 NOTE — Patient Instructions (Signed)
Peripheral Vascular Disease Peripheral vascular disease (PVD) is a disease of the blood vessels that are not part of your heart and brain. A simple term for PVD is poor circulation. In most cases, PVD narrows the blood vessels that carry blood from your heart to the rest of your body. This can result in a decreased supply of blood to your arms, legs, and internal organs, like your stomach or kidneys. However, it most often affects a person's lower legs and feet. There are two types of PVD.  Organic PVD. This is the more common type. It is caused by damage to the structure of blood vessels.  Functional PVD. This is caused by conditions that make blood vessels contract and tighten (spasm). Without treatment, PVD tends to get worse over time. PVD can also lead to acute ischemic limb. This is when an arm or limb suddenly has trouble getting enough blood. This is a medical emergency. Follow these instructions at home:  Take medicines only as told by your doctor.  Do not use any tobacco products, including cigarettes, chewing tobacco, or electronic cigarettes. If you need help quitting, ask your doctor.  Lose weight if you are overweight, and maintain a healthy weight as told by your doctor.  Eat a diet that is low in fat and cholesterol. If you need help, ask your doctor.  Exercise regularly. Ask your doctor for some good activities for you.  Take good care of your feet.  Wear comfortable shoes that fit well.  Check your feet often for any cuts or sores. Contact a doctor if:  You have cramps in your legs while walking.  You have leg pain when you are at rest.  You have coldness in a leg or foot.  Your skin changes.  You are unable to get or have an erection (erectile dysfunction).  You have cuts or sores on your feet that are not healing. Get help right away if:  Your arm or leg turns cold and blue.  Your arms or legs become red, warm, swollen, painful, or numb.  You have  chest pain or trouble breathing.  You suddenly have weakness in your face, arm, or leg.  You become very confused or you cannot speak.  You suddenly have a very bad headache.  You suddenly cannot see. This information is not intended to replace advice given to you by your health care provider. Make sure you discuss any questions you have with your health care provider. Document Released: 10/30/2009 Document Revised: 01/11/2016 Document Reviewed: 01/13/2014 Elsevier Interactive Patient Education  2017 Elsevier Inc.      Steps to Quit Smoking Smoking tobacco can be bad for your health. It can also affect almost every organ in your body. Smoking puts you and people around you at risk for many serious long-lasting (chronic) diseases. Quitting smoking is hard, but it is one of the best things that you can do for your health. It is never too late to quit. What are the benefits of quitting smoking? When you quit smoking, you lower your risk for getting serious diseases and conditions. They can include:  Lung cancer or lung disease.  Heart disease.  Stroke.  Heart attack.  Not being able to have children (infertility).  Weak bones (osteoporosis) and broken bones (fractures). If you have coughing, wheezing, and shortness of breath, those symptoms may get better when you quit. You may also get sick less often. If you are pregnant, quitting smoking can help to lower your chances   of having a baby of low birth weight. What can I do to help me quit smoking? Talk with your doctor about what can help you quit smoking. Some things you can do (strategies) include:  Quitting smoking totally, instead of slowly cutting back how much you smoke over a period of time.  Going to in-person counseling. You are more likely to quit if you go to many counseling sessions.  Using resources and support systems, such as:  Online chats with a counselor.  Phone quitlines.  Printed self-help  materials.  Support groups or group counseling.  Text messaging programs.  Mobile phone apps or applications.  Taking medicines. Some of these medicines may have nicotine in them. If you are pregnant or breastfeeding, do not take any medicines to quit smoking unless your doctor says it is okay. Talk with your doctor about counseling or other things that can help you. Talk with your doctor about using more than one strategy at the same time, such as taking medicines while you are also going to in-person counseling. This can help make quitting easier. What things can I do to make it easier to quit? Quitting smoking might feel very hard at first, but there is a lot that you can do to make it easier. Take these steps:  Talk to your family and friends. Ask them to support and encourage you.  Call phone quitlines, reach out to support groups, or work with a counselor.  Ask people who smoke to not smoke around you.  Avoid places that make you want (trigger) to smoke, such as:  Bars.  Parties.  Smoke-break areas at work.  Spend time with people who do not smoke.  Lower the stress in your life. Stress can make you want to smoke. Try these things to help your stress:  Getting regular exercise.  Deep-breathing exercises.  Yoga.  Meditating.  Doing a body scan. To do this, close your eyes, focus on one area of your body at a time from head to toe, and notice which parts of your body are tense. Try to relax the muscles in those areas.  Download or buy apps on your mobile phone or tablet that can help you stick to your quit plan. There are many free apps, such as QuitGuide from the CDC (Centers for Disease Control and Prevention). You can find more support from smokefree.gov and other websites. This information is not intended to replace advice given to you by your health care provider. Make sure you discuss any questions you have with your health care provider. Document Released:  06/01/2009 Document Revised: 04/02/2016 Document Reviewed: 12/20/2014 Elsevier Interactive Patient Education  2017 Elsevier Inc.  

## 2016-12-17 NOTE — Addendum Note (Signed)
Addended by: Burton Apley A on: 12/17/2016 09:18 AM   Modules accepted: Orders

## 2017-01-28 ENCOUNTER — Ambulatory Visit: Payer: Medicare Other | Admitting: Internal Medicine

## 2017-02-03 ENCOUNTER — Ambulatory Visit (INDEPENDENT_AMBULATORY_CARE_PROVIDER_SITE_OTHER): Payer: Medicare Other | Admitting: Internal Medicine

## 2017-02-03 ENCOUNTER — Encounter: Payer: Self-pay | Admitting: Internal Medicine

## 2017-02-03 VITALS — BP 126/80 | HR 82 | Ht 67.0 in | Wt 141.0 lb

## 2017-02-03 DIAGNOSIS — J449 Chronic obstructive pulmonary disease, unspecified: Secondary | ICD-10-CM

## 2017-02-03 MED ORDER — FORMOTEROL FUMARATE 20 MCG/2ML IN NEBU: 20.0000 ug | INHALATION_SOLUTION | Freq: Two times a day (BID) | RESPIRATORY_TRACT | Status: DC

## 2017-02-03 MED ORDER — BUDESONIDE 0.25 MG/2ML IN SUSP: 0.2500 mg | Freq: Two times a day (BID) | RESPIRATORY_TRACT | 11 refills | Status: DC | PRN

## 2017-02-03 MED ORDER — FORMOTEROL FUMARATE 20 MCG/2ML IN NEBU: 20.0000 ug | INHALATION_SOLUTION | Freq: Two times a day (BID) | RESPIRATORY_TRACT | 11 refills | Status: DC

## 2017-02-03 NOTE — Patient Instructions (Addendum)
Congratulations on not smoking - this is the most important aspect of your care   Plan A = Automatic =  Performist 20 mcg and budesonide 0.25 together first thing each am and the 2nd doses 12 hours later if needed   Plan B = Backup Only use your albuterol (proair) as a rescue medication to be used if you can't catch your breath by resting or doing a relaxed purse lip breathing pattern.  - The less you use it, the better it will work when you need it. - Ok to use the inhaler up to 2 puffs  every 4 hours if you must but call for appointment if use goes up over your usual need - Don't leave home without it !!  (think of it like the spare tire for your car)    Please schedule a follow up visit in 3 months but call sooner if needed with pfts on return and bring your meds with you too
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
## 2017-02-04 NOTE — Assessment & Plan Note (Signed)
Spirometry 04/30/2016  FEV1 1.23 (49%)  Ratio 67 - 04/30/2016  Walked RA x 3 laps @ 185 ft each stopped due to  End of study, fast pace pace, sob with desats at 88%    - 04/30/2016 rec trial of BREO > no better - PFT's  06/18/2016  FEV1 1.44 (49 % ) ratio 56  p 9 % improvement from saba p BREO prior to study with DLCO  37/38 % corrects to 41  % for alv volume      Group D in terms of symptom/risk and laba/lama/ICS  therefore appropriate rx at this point but more limited by knees usually than sob and concerned about the cost of care - since stopped smoking will likely due fine on just laba/ics at this point  I had an extended discussion with the patient reviewing all relevant studies completed to date and  lasting 15 to 20 minutes of a 25 minute visit    Each maintenance medication was reviewed in detail including most importantly the difference between maintenance and prns and under what circumstances the prns are to be triggered using an action plan format that is not reflected in the computer generated alphabetically organized AVS.    Please see AVS for specific instructions unique to this visit that I personally wrote and verbalized to the the pt in detail and then reviewed with pt  by my nurse highlighting any  changes in therapy recommended at today's visit to their plan of care.

## 2017-02-04 NOTE — Assessment & Plan Note (Signed)
Spirometry 04/30/2016  FEV1 1.23 (49%)  Ratio 67 - 04/30/2016  Walked RA x 3 laps @ 185 ft each stopped due to  End of study, fast pace pace, sob with desats at 88%    - 04/30/2016 rec trial of BREO > no better - PFT's  06/18/2016  FEV1 1.44 (49 % ) ratio 56  p 9 % improvement from saba p BREO prior to study with DLCO  37/38 % corrects to 41  % for alv volume     He is doing some better on nebulizer treatment with a LABA an inhaled steroid despite the fact that he is still smoking and only using the drug at half the intended frequency. Since he has no nocturnal symptoms this is fine with me. There may be other alternatives however including the use of a LAMA/LABA combination if cost of the nebulizer issue and there are cheaper alternatives on his new formulary coming up next month.  I had an extended discussion with the patient reviewing all relevant studies completed to date and  lasting 15 to 20 minutes of a 25 minute visit on the following ongoing concerns:   Formulary restrictions will be an ongoing challenge for the forseable future and I would be happy to pick an alternative if the pt will first  provide me a list of them but pt  will need to return here for training for any new device that is required eg dpi vs hfa vs respimat.    In meantime we can always provide samples so the patient never runs out of any needed respiratory medications.   Each maintenance medication was reviewed in detail including most importantly the difference between maintenance and as needed and under what circumstances the prns are to be used.  Please see AVS for  customized  Instructions which were reviewed in detail in writing with the patient and a copy provided.

## 2017-02-12 ENCOUNTER — Ambulatory Visit (INDEPENDENT_AMBULATORY_CARE_PROVIDER_SITE_OTHER): Payer: Medicare Other | Admitting: Family Medicine

## 2017-02-12 ENCOUNTER — Encounter: Payer: Self-pay | Admitting: Family Medicine

## 2017-02-12 VITALS — BP 131/81 | HR 112 | Temp 98.0°F | Resp 16 | Ht 67.0 in | Wt 137.0 lb

## 2017-02-12 DIAGNOSIS — F419 Anxiety disorder, unspecified: Secondary | ICD-10-CM

## 2017-02-12 DIAGNOSIS — E785 Hyperlipidemia, unspecified: Secondary | ICD-10-CM | POA: Insufficient documentation

## 2017-02-12 LAB — CBC WITH DIFFERENTIAL/PLATELET
BASOS PCT: 0.6 % (ref 0.0–3.0)
Basophils Absolute: 0.1 10*3/uL (ref 0.0–0.1)
EOS PCT: 0.5 % (ref 0.0–5.0)
Eosinophils Absolute: 0.1 10*3/uL (ref 0.0–0.7)
HCT: 40.6 % (ref 39.0–52.0)
Hemoglobin: 13.7 g/dL (ref 13.0–17.0)
LYMPHS ABS: 1 10*3/uL (ref 0.7–4.0)
Lymphocytes Relative: 5.9 % — ABNORMAL LOW (ref 12.0–46.0)
MCHC: 33.8 g/dL (ref 30.0–36.0)
MCV: 91.9 fl (ref 78.0–100.0)
MONOS PCT: 4.8 % (ref 3.0–12.0)
Monocytes Absolute: 0.8 10*3/uL (ref 0.1–1.0)
NEUTROS ABS: 14.4 10*3/uL — AB (ref 1.4–7.7)
Neutrophils Relative %: 88.2 % — ABNORMAL HIGH (ref 43.0–77.0)
PLATELETS: 378 10*3/uL (ref 150.0–400.0)
RBC: 4.42 Mil/uL (ref 4.22–5.81)
RDW: 14.3 % (ref 11.5–15.5)
WBC: 16.4 10*3/uL — ABNORMAL HIGH (ref 4.0–10.5)

## 2017-02-12 LAB — BASIC METABOLIC PANEL
BUN: 13 mg/dL (ref 6–23)
CALCIUM: 9.7 mg/dL (ref 8.4–10.5)
CO2: 29 mEq/L (ref 19–32)
Chloride: 95 mEq/L — ABNORMAL LOW (ref 96–112)
Creatinine, Ser: 0.84 mg/dL (ref 0.40–1.50)
GFR: 95.83 mL/min (ref >60.00)
Glucose, Bld: 161 mg/dL — ABNORMAL HIGH (ref 70–99)
POTASSIUM: 4.2 meq/L (ref 3.5–5.1)
SODIUM: 134 meq/L — AB (ref 135–145)

## 2017-02-12 LAB — LIPID PANEL
Cholesterol: 143 mg/dL (ref 0–200)
HDL: 60.9 mg/dL (ref >39.00)
LDL CALC: 68 mg/dL (ref 0–99)
NONHDL: 82.35
Total CHOL/HDL Ratio: 2
Triglycerides: 74 mg/dL (ref 0.0–149.0)
VLDL: 14.8 mg/dL (ref 0.0–40.0)

## 2017-02-12 LAB — HEPATIC FUNCTION PANEL
ALK PHOS: 67 U/L (ref 39–117)
ALT: 6 U/L (ref 0–53)
AST: 15 U/L (ref 0–37)
Albumin: 4.3 g/dL (ref 3.5–5.2)
BILIRUBIN DIRECT: 0.3 mg/dL (ref 0.0–0.3)
BILIRUBIN TOTAL: 0.9 mg/dL (ref 0.2–1.2)
Total Protein: 7.9 g/dL (ref 6.0–8.3)

## 2017-02-12 LAB — TSH: TSH: 1.34 u[IU]/mL (ref 0.35–4.50)

## 2017-02-12 MED ORDER — SERTRALINE HCL 25 MG PO TABS: 25.0000 mg | ORAL_TABLET | Freq: Every day | ORAL | 3 refills | Status: DC

## 2017-02-12 NOTE — Progress Notes (Signed)
   Subjective:    Patient ID: Christopher Pena, male    DOB: 02/03/1946, 71 y.o.   MRN: [id number]  HPI Hyperlipidemia- chronic problem, on Lipitor 10mg  daily.  Denies abd pain, N/V.  Pt quit smoking 91 days ago.  Tremor- pt is having involuntary shaking of his head and hands.  Pt reports he is very nervous today.  Pt reports he rarely shakes at home and attributes this to his nervousness today.  Pt reports he has anxiety 'a good bit'.   Review of Systems For ROS see HPI     Objective:   Physical Exam  Constitutional: He is oriented to person, place, and time. He appears well-developed. No distress.  thin  HENT:  Head: Normocephalic and atraumatic.  Eyes: Conjunctivae and EOM are normal. Pupils are equal, round, and reactive to light.  Neck: Normal range of motion. Neck supple. No thyromegaly present.  Cardiovascular: Regular rhythm, normal heart sounds and intact distal pulses.   No murmur heard. Tachy but regular  Pulmonary/Chest: Effort normal and breath sounds normal. No respiratory distress.  Abdominal: Soft. Bowel sounds are normal. He exhibits no distension.  Musculoskeletal: He exhibits no edema.  Lymphadenopathy:    He has no cervical adenopathy.  Neurological: He is alert and oriented to person, place, and time. No cranial nerve deficit.  Tremor of head and hands  Skin: Skin is warm and dry.  Psychiatric: He has a normal mood and affect. His behavior is normal.  Vitals reviewed.         Assessment & Plan:

## 2017-02-12 NOTE — Progress Notes (Signed)
Pre visit review using our clinic review tool, if applicable. No additional management support is needed unless otherwise documented below in the visit note. 

## 2017-02-12 NOTE — Assessment & Plan Note (Signed)
Deteriorated.  Pt is very anxious today in exam room- trembling and unable to calm down.  He reports he feels this way whenever he has to leave the house b/c he is fearful he will have shortness of breath.  Pain management doesn't want him on benzos- which I agree with- so we'll start Sertraline once daily and follow closely.  Pt expressed understanding and is in agreement w/ plan.

## 2017-02-12 NOTE — Assessment & Plan Note (Signed)
Chronic problem.  Tolerating statin w/o difficulty.  Check labs.  Adjust meds prn  

## 2017-02-12 NOTE — Patient Instructions (Signed)
Follow up in 1 month to recheck anxiety We'll notify you of your lab results and make any changes if needed START the Sertraline once daily for the anxiety Continue to eat and drink regularly Call with any questions or concerns Hang in there!!

## 2017-02-13 ENCOUNTER — Other Ambulatory Visit: Payer: Self-pay | Admitting: General Practice

## 2017-02-13 MED ORDER — DOXYCYCLINE HYCLATE 100 MG PO TABS
100.0000 mg | ORAL_TABLET | Freq: Two times a day (BID) | ORAL | 0 refills | Status: DC
Start: 2017-02-13 — End: 2017-02-24

## 2017-02-24 ENCOUNTER — Ambulatory Visit (INDEPENDENT_AMBULATORY_CARE_PROVIDER_SITE_OTHER): Payer: Medicare Other | Admitting: Family Medicine

## 2017-02-24 ENCOUNTER — Encounter: Payer: Self-pay | Admitting: Family Medicine

## 2017-02-24 VITALS — BP 122/82 | HR 80 | Temp 98.1°F | Resp 16 | Ht 67.0 in | Wt 137.0 lb

## 2017-02-24 DIAGNOSIS — D72829 Elevated white blood cell count, unspecified: Secondary | ICD-10-CM

## 2017-02-24 LAB — CBC WITH DIFFERENTIAL/PLATELET
Basophils Absolute: 0.1 10*3/uL (ref 0.0–0.1)
Basophils Relative: 1.3 % (ref 0.0–3.0)
EOS ABS: 0.2 10*3/uL (ref 0.0–0.7)
Eosinophils Relative: 1.7 % (ref 0.0–5.0)
HCT: 42.5 % (ref 39.0–52.0)
HEMOGLOBIN: 14.1 g/dL (ref 13.0–17.0)
LYMPHS ABS: 1.3 10*3/uL (ref 0.7–4.0)
Lymphocytes Relative: 13.8 % (ref 12.0–46.0)
MCHC: 33.2 g/dL (ref 30.0–36.0)
MCV: 93.4 fl (ref 78.0–100.0)
MONO ABS: 0.7 10*3/uL (ref 0.1–1.0)
Monocytes Relative: 6.7 % (ref 3.0–12.0)
NEUTROS PCT: 76.5 % (ref 43.0–77.0)
Neutro Abs: 7.5 10*3/uL (ref 1.4–7.7)
Platelets: 261 10*3/uL (ref 150.0–400.0)
RBC: 4.55 Mil/uL (ref 4.22–5.81)
RDW: 14.8 % (ref 11.5–15.5)
WBC: 9.8 10*3/uL (ref 4.0–10.5)

## 2017-02-24 NOTE — Patient Instructions (Signed)
Schedule your complete physical for December and your Medicare Wellness Visit [facility] any [facility] if needed Continue the Sertraline daily for the anxiety Your lungs sound great!  This is good news! Continue to drink plenty of fluids Call with any questions or concerns Hang in there!!!

## 2017-02-24 NOTE — Progress Notes (Signed)
   Subjective:    Patient ID: Christopher GageDavid H Pena, male    DOB: Sep 26, 1945, 71 y.o.   MRN: [id number]  HPI Elevated WBC- pt's WBC on 6/27 was up to 16.4 on 6/27.  At that time, pt was complaining of cough and SOB.  Completed 7 day course of Doxy.  Cough and SOB has improved but he continues to have SOB w/ exertion.  Pt's biggest complaint today is lack of energy.  Pt reports he is drinking enough water.   Review of Systems For ROS see HPI     Objective:   Physical Exam  Constitutional: He is oriented to person, place, and time. He appears well-developed. No distress.  HENT:  Head: Normocephalic and atraumatic.  Cardiovascular: Normal rate, regular rhythm and normal heart sounds.   Pulmonary/Chest: Effort normal and breath sounds normal. No respiratory distress. He has no wheezes. He has no rales.  Neurological: He is alert and oriented to person, place, and time.  Skin: Skin is warm and dry.  Psychiatric: His behavior is normal. Thought content normal.  anxious  Vitals reviewed.         Assessment & Plan:  Elevated WBC- new at last visit.  Pt reports his cough and SOB are improving but he still has some SOB w/ exertion.  He remains smoke free.  Denies other sxs of infxn- no N/V/D, sinus pain/pressure, ear pain, skin rashes/lesions.  Check labs and determine the next steps based on results.  Pt expressed understanding and is in agreement w/ plan.

## 2017-02-24 NOTE — Progress Notes (Signed)
Pre visit review using our clinic review tool, if applicable. No additional management support is needed unless otherwise documented below in the visit note. 

## 2017-03-07 ENCOUNTER — Encounter: Payer: Self-pay | Admitting: Family

## 2017-03-17 ENCOUNTER — Ambulatory Visit (HOSPITAL_COMMUNITY)
Admission: RE | Admit: 2017-03-17 | Discharge: 2017-03-17 | Disposition: A | Discharge status: Home / Self Care | Payer: Medicare Other | Source: Ambulatory Visit | Attending: Surgery | Admitting: Surgery

## 2017-03-17 ENCOUNTER — Encounter: Payer: Self-pay | Admitting: Family

## 2017-03-17 ENCOUNTER — Ambulatory Visit (INDEPENDENT_AMBULATORY_CARE_PROVIDER_SITE_OTHER): Payer: Medicare Other | Admitting: Family

## 2017-03-17 VITALS — BP 131/78 | HR 65 | Temp 97.7°F | Resp 20 | Ht 67.0 in | Wt 130.0 lb

## 2017-03-17 DIAGNOSIS — Z95828 Presence of other vascular implants and grafts: Secondary | ICD-10-CM

## 2017-03-17 DIAGNOSIS — I743 Embolism and thrombosis of arteries of the lower extremities: Secondary | ICD-10-CM | POA: Insufficient documentation

## 2017-03-17 DIAGNOSIS — I70212 Atherosclerosis of native arteries of extremities with intermittent claudication, left leg: Secondary | ICD-10-CM | POA: Diagnosis not present

## 2017-03-17 DIAGNOSIS — I714 Abdominal aortic aneurysm, without rupture, unspecified: Secondary | ICD-10-CM

## 2017-03-17 DIAGNOSIS — I70445 Atherosclerosis of autologous vein bypass graft(s) of the left leg with ulceration of other part of foot: Secondary | ICD-10-CM

## 2017-03-17 DIAGNOSIS — Z87891 Personal history of nicotine dependence: Secondary | ICD-10-CM

## 2017-03-17 MED ORDER — CILOSTAZOL 100 MG PO TABS: 100.0000 mg | ORAL_TABLET | Freq: Two times a day (BID) | ORAL | 6 refills | Status: DC

## 2017-03-17 NOTE — Patient Instructions (Signed)

## 2017-03-17 NOTE — Progress Notes (Signed)
[facility] [place]   CC: Follow up peripheral artery occlusive disease  History of Present Illness Christopher Pena is a 71 y.o. male who is status post left femoral to anterior tibial bypass graft on 12/10/2015. This was done for a wound on his left great toe. He has a history of a right femoral to below-knee popliteal bypass graft with vein which has occluded. He was seen on 04/15/2016 and his bypass graft was patent.On 05/20/2016 he came back in the office without a pulse in his bypass graft. He stated that had been going on for approximately 3 weeks. He had also developed a wound on his great toe. On 05/21/2016 he was taken to the operating room for a thrombectomy of his superficial femoral anterior tibial bypass graft. Dr. Myra GianottiBrabham was able to get the bypass graft open, however the vein appeared to be sclerotic. Dr. Myra GianottiBrabham performed angioplasty blindly in the operating room. The following day the patient continued to have a pulse within the graft and therefore he was taken to the Angio suite for arteriogram and additional balloon angioplasty with a 4 mm balloon. He was discharged home on Plavix.  He was scheduled for angiography to evaluate his bypass graft, however when he arrived it was found to be occluded. Because the vein was of such poor quality, Dr. Myra GianottiBrabham elected to redo his bypass graft, this time using PTFE down from the common femoral to the anterior tibial artery on 09-20-16. Dr. Myra GianottiBrabham placed retention sutures at the lateral lower leg because he was worried about swelling and tension in this area.  He is back today for left LE follow up.  He has a tight feeling in his right calf after 4-5 minutes of walking, about 7 minutes of walking and his left calf feels slightly painful.  He has numbness at the anterior aspect of his right calf, states since right leg surgery. He sees a pain management clinic, [facility], for chronic DDD and associated pain.  He  states that the pain management clinic stopped the xanax in 2017, he took for about 6-7 years, and a months ago he started having tremors in his head and hands.    About late June of 2018 he started having left calf claudication after walking about 50 yards, he did not have any claudication in his left leg at his visit 3 months ago. This pain is relieved by rest.    He stopped smoking 11-13-16, states because he had too much trouble breathing. He denies a hx of DM. The patient's wounds are healed.  The patient notes resolution of lower extremity symptoms.  The patient is able to complete their activities of daily living.     He denies any hx of stroke or TIA.   Pt Diabetic: No Pt smoker: former smoker, quit March 2018, started smoking at age 71 years  Pt meds include: Statin :Yes Betablocker: No ASA: No Other anticoagulants/antiplatelets: Plavix  Past Medical History:  Diagnosis Date  . Anxiety   . Chronic low back pain    "degenerative spine dx'd ~ 7 yr ago" (06/08/2013)  . Claudication (HCC)   . Constipation due to pain medication   . COPD (chronic obstructive pulmonary disease) (HCC)   . Emphysema of lung (HCC)   . Hypertension    "moderately high; RX didn't help" (06/08/2013) - no longer on meds (as of 09/19/16)  . Neuromuscular disorder (HCC)    degen spine  . Non-healing wound of lower extremity  06/09/2013  . PAD (peripheral artery disease), PTA/stent IDEV & chocolate baloon 06/08/13 04/13/2013   Severely reduced ABI's of 0.3 bilaterally   . Shortness of breath    w/ exertion  . Tobacco abuse   . Tobacco use 06/09/2013  . Unexplained weight loss     Social History Social History  Substance Use Topics  . Smoking status: Former Smoker    Packs/day: 0.25    Years: 50.00    Types: Cigarettes    Start date: 11/14/2015    Quit date: 11/12/2016  . Smokeless tobacco: Never Used     Comment: states "he is ready to quit"  . Alcohol use No     Comment: 06/08/2013 "quit  02/2007; drank my share before that though"    Family History Family History  Problem Relation Age of Onset  . Hyperlipidemia Mother   . Heart disease Mother   . Diabetes Mother   . Cancer Father        Liver    Past Surgical History:  Procedure Laterality Date  . ABDOMINAL AORTAGRAM  05/10/2013   Procedure: ABDOMINAL Ronny Flurry;  Surgeon: Runell Gess, MD;  Location: [facility] CATH LAB;  Service: Cardiovascular;;  . ABI     04/09/13; abnormal  . ANGIOPLASTY Left 05/21/2016   Procedure: ballon ANGIOPLASTY of femoral to anterior to tibial bypass graft;  Surgeon: Nada Libman, MD;  Location: [facility] OR;  Service: Vascular;  Laterality: Left;  . APPENDECTOMY  05/2001  . FEMORAL ARTERY STENT Right 05/10/13   2 stents Rt SFA  . FEMORAL ARTERY STENT Left 06/08/2013  . FEMORAL-POPLITEAL BYPASS GRAFT Right 11/12/2013   Procedure: RIGHT  FEMORAL-BELOW KNEE POPLITEAL ARTERY BYPASS GRAFT USING NON- REVERSE RIGHT GREATER SAPHENOUS VEIN.;  Surgeon: Nada Libman, MD;  Location: [facility] OR;  Service: Vascular;  Laterality: Right;  . FEMORAL-TIBIAL BYPASS GRAFT Left 12/08/2015   Procedure:  LEFT FEMORAL-ANTERIOR TIBIAL ARTERY BYPASS GRAFT;  Surgeon: Nada Libman, MD;  Location: [facility] OR;  Service: Vascular;  Laterality: Left;  . FEMORAL-TIBIAL BYPASS GRAFT Left 09/20/2016   Procedure: REDO BYPASS GRAFT FEMORAL-ANTERIOR TIBIAL ARTERY;  Surgeon: Nada Libman, MD;  Location[facility] OR;  Service: Vascular;  Laterality: Left;  . LOWER EXTREMITY ANGIOGRAM Bilateral 05/10/2013   Procedure: LOWER EXTREMITY ANGIOGRAM;  Surgeon: Runell Gess, MD;  Location: [facility] CATH LAB;  Service: Cardiovascular;  Laterality: Bilateral;  . LOWER EXTREMITY ANGIOGRAM N/A 06/08/2013   Procedure: LOWER EXTREMITY ANGIOGRAM;  Surgeon: Runell Gess, MD;  Location: [facility] CATH LAB;  Service: Cardiovascular;  Laterality: N/A;  . LOWER EXTREMITY ANGIOGRAM N/A 10/28/2013   Procedure: LOWER EXTREMITY ANGIOGRAM;  Surgeon: Runell Gess, MD;   Location: [facility] CATH LAB;  Service: Cardiovascular;  Laterality: N/A;  . LOWER EXTREMITY ANGIOGRAM N/A 05/16/2014   Procedure: LOWER EXTREMITY ANGIOGRAM;  Surgeon: Runell Gess, MD;  Location: [facility] CATH LAB;  Service: Cardiovascular;  Laterality: N/A;  . PERCUTANEOUS STENT INTERVENTION Right 05/10/2013   Procedure: PERCUTANEOUS STENT INTERVENTION;  Surgeon: Runell Gess, MD;  Location: [facility] CATH LAB;  Service: Cardiovascular;  Laterality: Right;  rt sfa and popliteal stent x2  . PERIPHERAL VASCULAR CATHETERIZATION N/A 12/06/2015   Procedure: Abdominal Aortogram w/Lower Extremity;  Surgeon: Nada Libman, MD;  Location: [facility] INVASIVE CV LAB;  Service: Cardiovascular;  Laterality: N/A;  . PERIPHERAL VASCULAR CATHETERIZATION N/A 05/22/2016   Procedure: Abdominal Aortogram w/Lower Extremity;  Surgeon: Maeola Harman, MD;  Location: [facility] INVASIVE CV LAB;  Service: Cardiovascular;  Laterality: N/A;  . PERIPHERAL VASCULAR CATHETERIZATION N/A 09/17/2016   Procedure: Abdominal Aortogram w/Lower Extremity;  Surgeon: Nada Libman, MD;  Location: [facility] INVASIVE CV LAB;  Service: Cardiovascular;  Laterality: N/A;  . THROMBECTOMY FEMORAL ARTERY Left 05/21/2016   Procedure: THROMBECTOMY FEMORAL TO ANTERIOR TIBIAL BYPASS GRAFT;  Surgeon: Nada Libman, MD;  Location: [facility] OR;  Service: Vascular;  Laterality: Left;  Marland Kitchen VEIN HARVEST Left 12/08/2015   Procedure: USING NON REVERSE  LEFT GREATER SAPHENOUS VEIN;  Surgeon: Nada Libman, MD;  Location: [facility] OR;  Service: Vascular;  Laterality: Left;    Allergies  Allergen Reactions  . Clindamycin/Lincomycin Diarrhea    Current Outpatient Prescriptions  Medication Sig Dispense Refill  . atorvastatin (LIPITOR) 10 MG tablet Take 1 tablet (10 mg total) by mouth daily. 30 tablet 11  . budesonide (PULMICORT) 0.25 MG/2ML nebulizer solution Take 2 mLs (0.25 mg total) by nebulization every 12 (twelve) hours as needed (shortness or breath). Use one vial twice daily 60 mL  11  . clopidogrel (PLAVIX) 75 MG tablet Take 1 tablet (75 mg total) by mouth daily. 30 tablet 11  . formoterol (PERFOROMIST) 20 MCG/2ML nebulizer solution Take 2 mLs (20 mcg total) by nebulization 2 (two) times daily. 60 mL 11  . oxyCODONE (ROXICODONE) 15 MG immediate release tablet Take 1 tablet (15 mg total) by mouth every 4 (four) hours as needed for pain. 30 tablet 0  . PROAIR HFA 108 (90 Base) MCG/ACT inhaler     . sertraline (ZOLOFT) 25 MG tablet Take 1 tablet (25 mg total) by mouth daily. 30 tablet 3   No current facility-administered medications for this visit.     ROS: See HPI for pertinent positives and negatives.   Physical Examination  Vitals:   03/17/17 1320  BP: 131/78  Pulse: 65  Resp: 20  Temp: 97.7 F (36.5 C)  TempSrc: Oral  SpO2: 93%  Weight: 130 lb (59 kg)  Height: 5\' 7"  (1.702 m)   Body mass index is 20.36 kg/m.  General: A&O x 3, WDWN male. Gait: normal Eyes: PERRLA. Pulmonary: Respirations are non labored, CTAB, good air movement Cardiac: regular rhythm and rate, no detected murmur.         Carotid Bruits Right Left   Negative Negative   Radial pulses are 2+ palpable bilaterally   Adominal aortic pulse is not palpable                         VASCULAR EXAM: Extremities with ischemic changes: left toes are slightly cyanotic, cool.  without Gangrene; without open wounds.                                                                                                          LE Pulses Right Left       FEMORAL  1+ palpable  1+ palpable        POPLITEAL  not palpable   not palpable       POSTERIOR TIBIAL  not palpable  not palpable        DORSALIS PEDIS      ANTERIOR TIBIAL not palpable  not palpable    Abdomen: soft, NT, no palpable masses. Skin: no rashes, no ulcers noted. Mild seborrheic dermatitis at face.  Musculoskeletal: no muscle wasting or atrophy.  Neurologic: A&O X 3; Appropriate Affect ; SENSATION: normal; MOTOR FUNCTION:   moving all extremities equally, motor strength 5/5 throughout. Speech is fluent/normal. CN 2-12 intact. Resting fine tremor in head and hands.     ASSESSMENT: Christopher GageDavid H Sabic is a 71 y.o. male who is s/p redo left bypass graft using PTFE down from the common femoral to the anterior tibial artery on 09-20-16. Prior to that he is s/p left femoral to anterior tibial bypass graft on 12/10/2015, and thrombectomy of his superficial femoral anterior tibial bypass graft on 05-21-16.   The left LE bypass graft has occluded since his visit on 12-16-16 when it had no stenosis by duplex. By description of his symptoms, this probably occurred a month ago when he started having left calf claudication after walking about 50 yards, he did not have any claudication in his left leg at his visit 3 months prior. This pain is relieved by rest.   He stopped smoking in March 2018, stated reason was that he had too much trouble breathing.  He does not have DM.  Bilateral femoral pulses: 1+ right, 1+ left; bilateral pedal pulses are not palpable. Left lower Eextremity: Incisions are healed, left great toe ulcer is healed, is dry, no gangrene.    Start cilosotazol 100 mg daily, if some or no benefit in 2 weeks, or if side effects such as flushing, headache, loose stools are not bothersome, increase to twice daily. I advised pt to stop taking if no benefit after a month.   The patient's bypass incisions have healed appropriately with resolution of pre-operative symptoms.   DATA  Left LE Arterial Duplex (03/17/17): The bypass graft is occluded from the origin to the distal end.  The graft was patent with no restenosis on 12-16-16.   ABI (Date: 03/17/2017)  R:   ABI: 0.47 (was 0.50 on 12-16-16),   PT: mono (was mono)  DP: absent (was mono)  TBI:  0.19 (was dampened)  L:   ABI: 0.45 (was 1.18),   PT: mono (was bi)  DP: absent (was bi)  TBI: 0.15 (was 0.54)  12-16-16 Left Lower Extremity Arterial  Duplex: Patent left LE bypass graft with no evidence of restenosis.  This is the first post-operative duplex exam.  12-16-16 ABI: Right: 0.50 (0.54, 09-22-16), waveforms: monophasic; TBI: dampened Left: 1.18 (0.83, 09-22-16), waveforms: biphasic; TBI: 0.54  11-27-15 AAA Duplex: 3.6 cm mid abdominal aortic aneurysm. Right CIA: 2.0 cm; Left CIA: 1.5 cm  09-17-16 Abdominal arteriogram Aortogram: No significant renal artery stenosis. No aortic stenosis. Bilateral common and external iliac arteries are widely patent Right Lower Extremity: Not evaluated Left Lower Extremity:Left common femoral profunda femoral artery are patent. The bypass graft is not visualized. There is reconstitution of the anterior tibial and peroneal artery. The anterior tibial is the dominant vessel across the ankle.    PLAN:  Based on the patient's vascular studies and examination, and after discussing with Dr. Myra GianottiBrabham, pt will return to clinic in 6 months with ABI's and AAA duplex.  I advised pt to notify us if he develops no  Healing wound in his feet or legs, and if the symptoms in his leg or legs worsens.  I discussed in depth with the patient the nature of atherosclerosis, and emphasized the importance of maximal medical management including strict control of blood pressure, blood glucose, and lipid levels, obtaining regular exercise, and continued cessation of smoking.  The patient is aware that without maximal medical management the underlying atherosclerotic disease process will progress, limiting the benefit of any interventions.  The patient was given information about PAD including signs, symptoms, treatment, what symptoms should prompt the patient to seek immediate medical care, and risk reduction measures to take.  Charisse March, RN, MSN, FNP-C [facility] [place] Phone: [phone]  Clinic MD: Myra Gianotti  03/17/17 1:23 PM

## 2017-03-25 NOTE — Addendum Note (Signed)
Addended by: Burton ApleyPETTY, Nathanael Krist A on: 03/25/2017 04:16 PM   Modules accepted: Orders

## 2017-04-11 ENCOUNTER — Other Ambulatory Visit: Payer: Self-pay | Admitting: Family Medicine

## 2017-04-18 ENCOUNTER — Telehealth: Payer: Self-pay | Admitting: General Practice

## 2017-04-18 ENCOUNTER — Telehealth: Payer: Self-pay | Admitting: Family Medicine

## 2017-04-18 NOTE — Telephone Encounter (Signed)
spoke with pt, he advised that his pain management provider advised that he would like pt to be on buspar because of the increased shaking. Pt had been alprazolam in the past but the pain clinic is against him remaining on this. Pt is not sure if they would want him to be on both sertraline and buspar. He would like to know what you think. He feels as though the sertraline is not helping with his nerves/shaking. He says that he has noticed that this problem keeps increasing.

## 2017-04-18 NOTE — Telephone Encounter (Signed)
Pt called asking for a call be regarding a medication that another doctor had suggested that he take, when I asked what the medicine what for pt stated that KT would know.

## 2017-04-18 NOTE — Telephone Encounter (Signed)
Error

## 2017-04-22 MED ORDER — BUSPIRONE HCL 7.5 MG PO TABS: 7.5000 mg | ORAL_TABLET | Freq: Two times a day (BID) | ORAL | 3 refills | Status: DC

## 2017-04-22 NOTE — Telephone Encounter (Signed)
Pt made aware of recommendation. Buspar was sent to pharmacy. Pt would like to wait before we refer to neuro to see if this new medication helps.

## 2017-04-22 NOTE — Telephone Encounter (Signed)
I am ok w/ both the Buspar and the Sertraline.  However, if the shaking is worsening, I would recommend a neuro referral for a complete evaluation.

## 2017-04-22 NOTE — Addendum Note (Signed)
Addended by: Yvone NeuBRODMERKEL, JESSICA L on: 04/22/2017 11:54 AM   Modules accepted: Orders

## 2017-05-08 ENCOUNTER — Ambulatory Visit: Payer: Medicare Other | Admitting: Internal Medicine

## 2017-05-13 ENCOUNTER — Ambulatory Visit: Payer: Medicare Other | Admitting: Internal Medicine

## 2017-05-15 IMAGING — CR DG FOOT COMPLETE 3+V*L*
3 series · 3 of 3 positions shown · non-contrast
Comparison: Radiographs November 22, 2015.

CLINICAL DATA: Nonhealing left toe ulcer.

EXAM:
LEFT FOOT - COMPLETE 3+ VIEW

[t foot ap left]
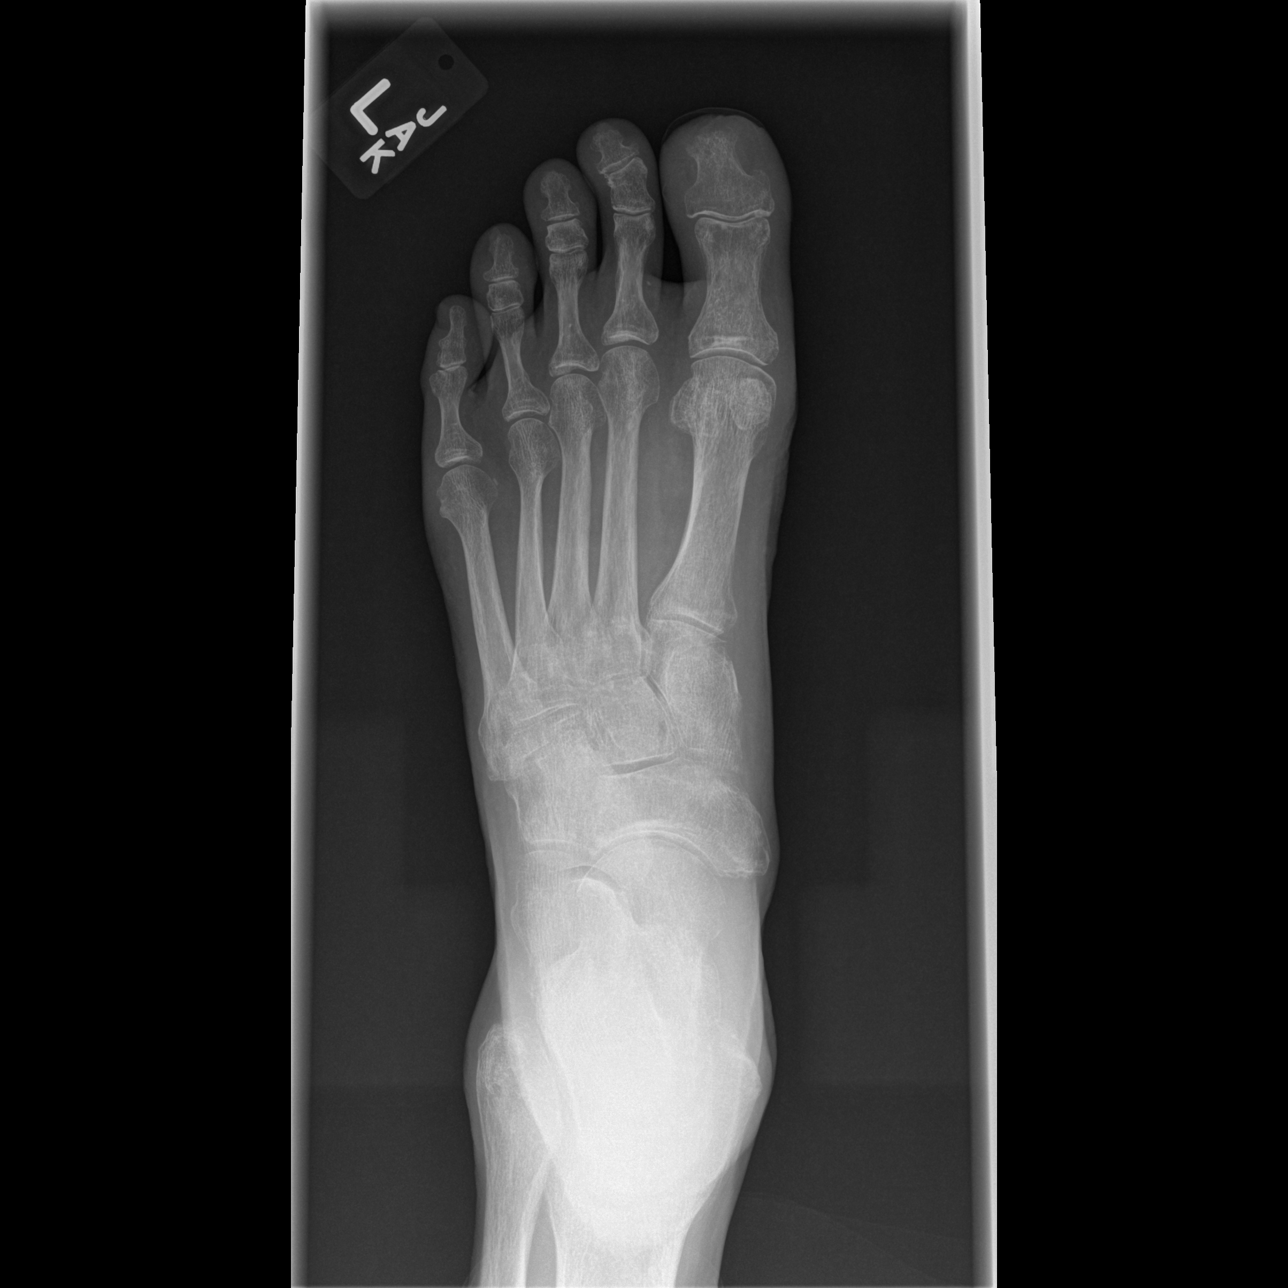

[t foot oblique left]
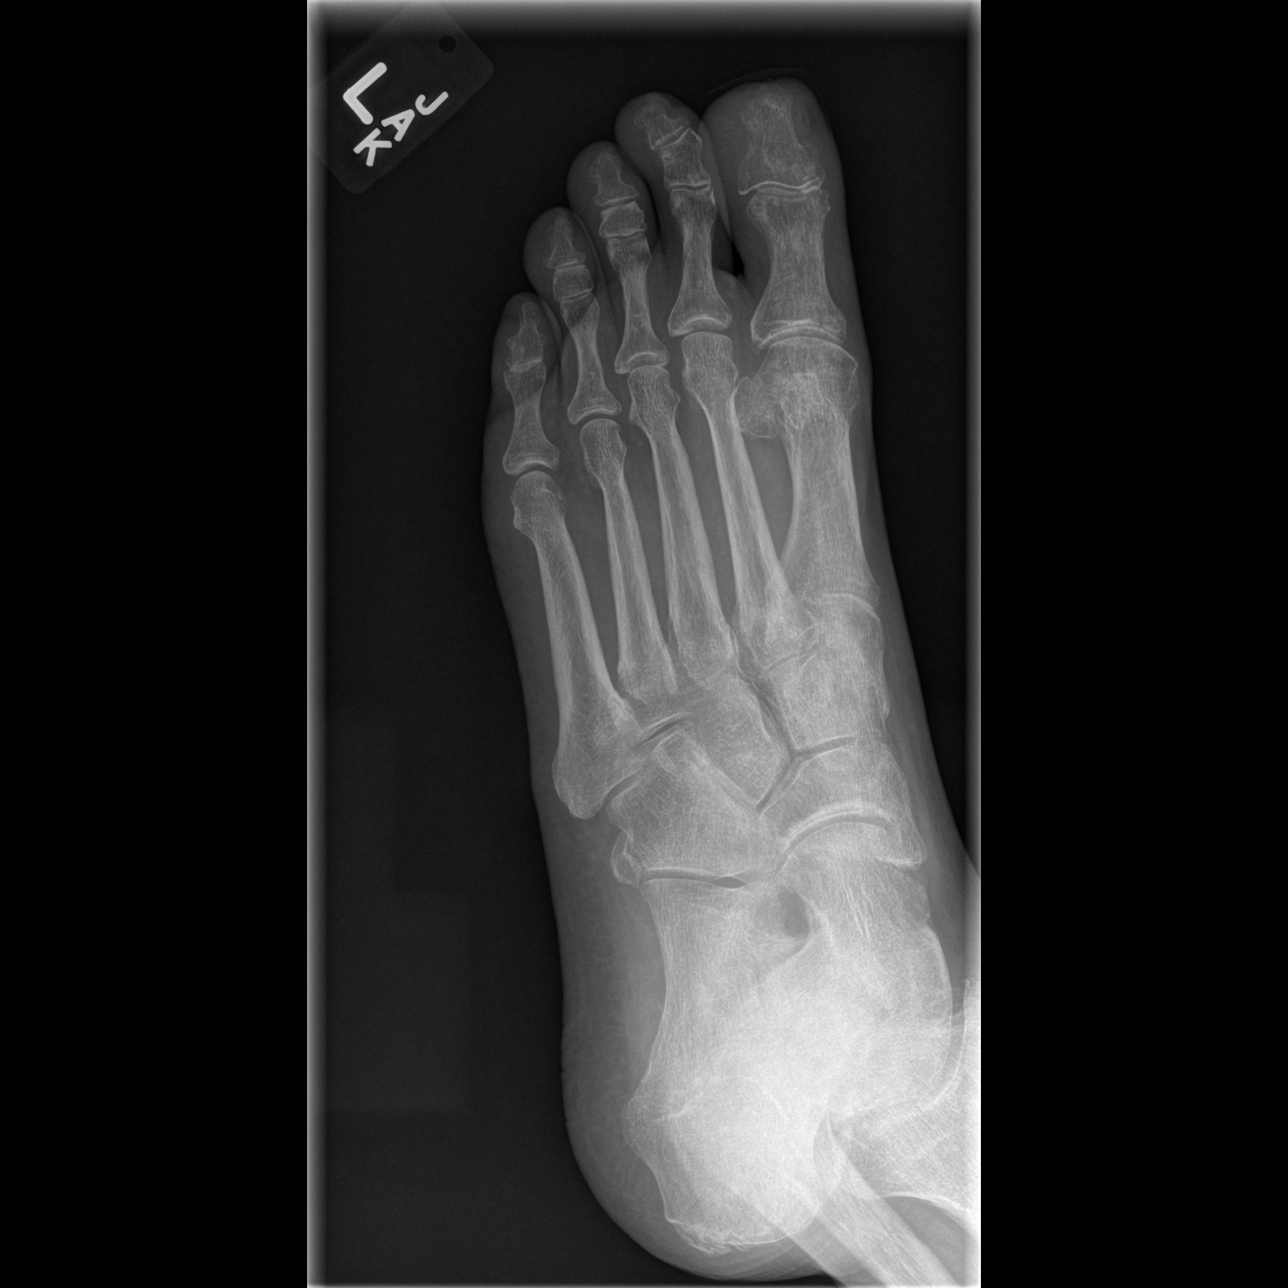

[t foot lat left]
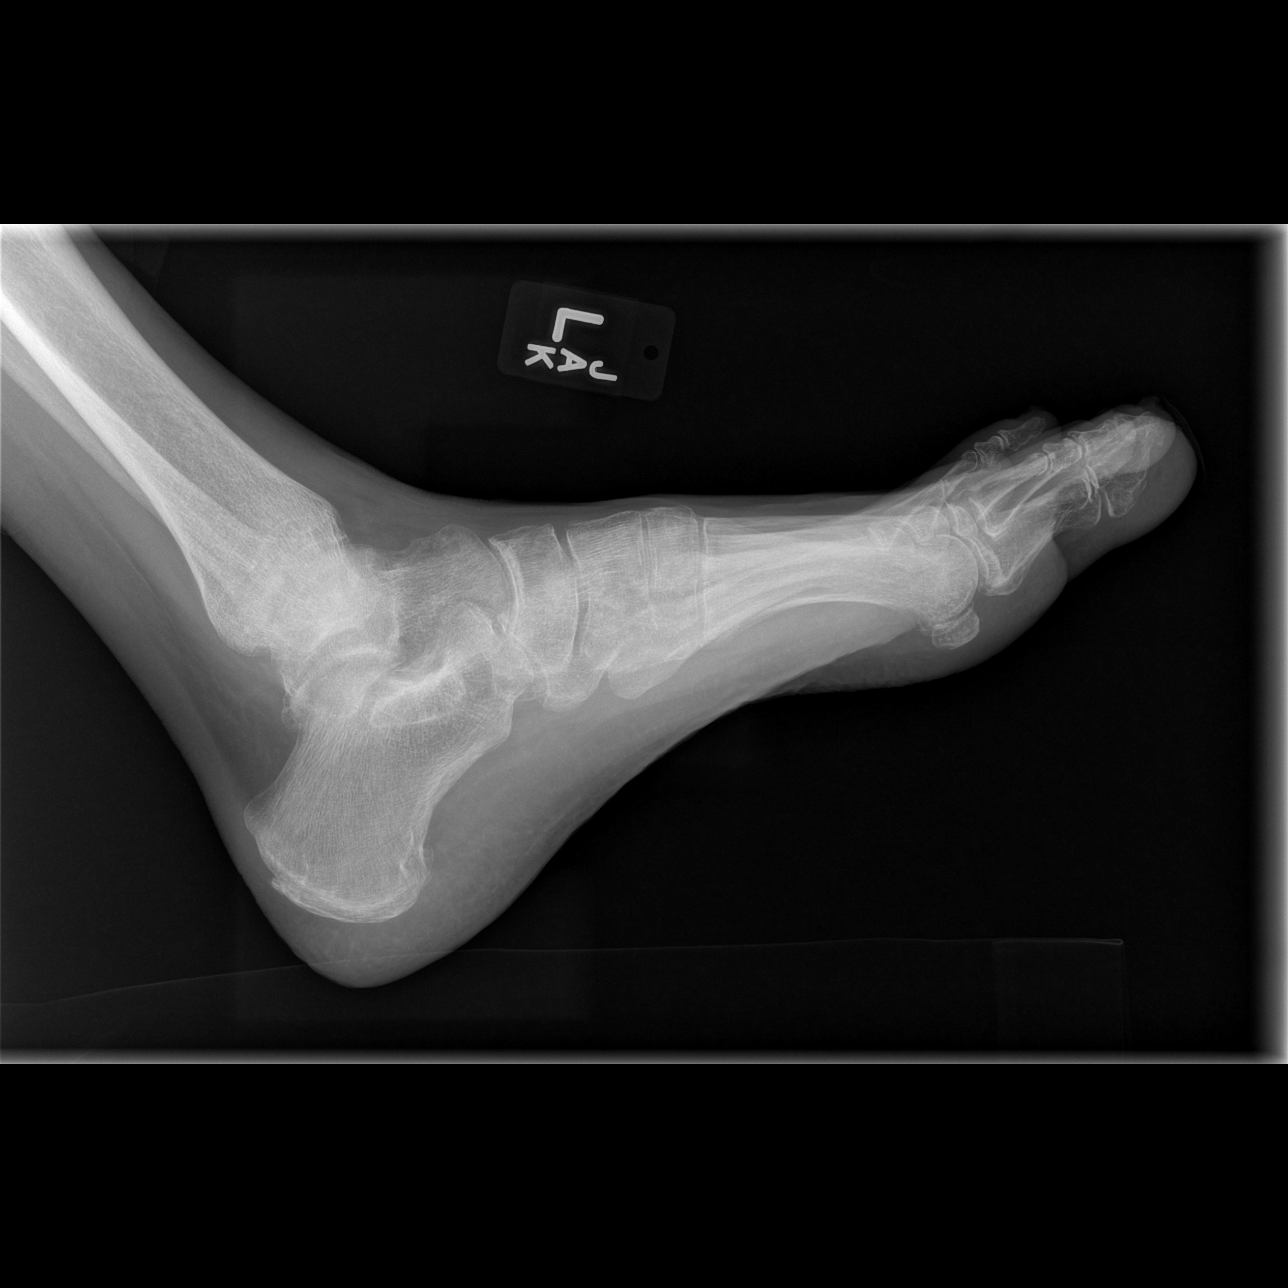

[3 of 3 positions shown; findings below may reference images not displayed]

FINDINGS: Irregularity is seen involving the distal soft tissues of the first
toe, with lytic destruction of the distal tuft of the first distal
phalanx suggesting osteomyelitis. No fracture or dislocation is
noted. No other bony abnormality is noted. Soft tissues are
unremarkable.
IMPRESSION: Findings concerning for osteomyelitis of the distal tuft of the
first distal phalanx.

## 2017-06-10 ENCOUNTER — Other Ambulatory Visit: Payer: Self-pay | Admitting: Family Medicine

## 2017-07-08 ENCOUNTER — Other Ambulatory Visit: Payer: Self-pay | Admitting: Family Medicine

## 2017-07-08 NOTE — Telephone Encounter (Signed)
Medication filled to pharmacy as requested.   

## 2017-08-17 ENCOUNTER — Other Ambulatory Visit: Payer: Self-pay | Admitting: Family Medicine

## 2017-10-06 ENCOUNTER — Encounter: Payer: Self-pay | Admitting: Family

## 2017-10-06 ENCOUNTER — Ambulatory Visit (HOSPITAL_COMMUNITY)
Admission: RE | Admit: 2017-10-06 | Discharge: 2017-10-06 | Disposition: A | Discharge status: Home / Self Care | Payer: Medicare Other | Source: Ambulatory Visit | Attending: Family | Admitting: Family

## 2017-10-06 ENCOUNTER — Ambulatory Visit (INDEPENDENT_AMBULATORY_CARE_PROVIDER_SITE_OTHER)
Admission: RE | Admit: 2017-10-06 | Discharge: 2017-10-06 | Disposition: A | Discharge status: Home / Self Care | Payer: Medicare Other | Source: Ambulatory Visit | Attending: Family | Admitting: Family

## 2017-10-06 ENCOUNTER — Ambulatory Visit: Payer: Medicare Other | Admitting: Family

## 2017-10-06 VITALS — BP 130/80 | HR 131 | Temp 97.0°F | Resp 17 | Wt 165.1 lb

## 2017-10-06 DIAGNOSIS — I779 Disorder of arteries and arterioles, unspecified: Secondary | ICD-10-CM | POA: Diagnosis not present

## 2017-10-06 DIAGNOSIS — I70445 Atherosclerosis of autologous vein bypass graft(s) of the left leg with ulceration of other part of foot: Secondary | ICD-10-CM | POA: Diagnosis not present

## 2017-10-06 DIAGNOSIS — Z95828 Presence of other vascular implants and grafts: Secondary | ICD-10-CM

## 2017-10-06 DIAGNOSIS — Z87891 Personal history of nicotine dependence: Secondary | ICD-10-CM

## 2017-10-06 DIAGNOSIS — I714 Abdominal aortic aneurysm, without rupture, unspecified: Secondary | ICD-10-CM

## 2017-10-06 NOTE — Progress Notes (Signed)
[facility]  HISTORY AND PHYSICAL   CC: Follow up peripheral artery occlusive disease and AAA   History of Present Illness:   Christopher Pena is a 72 y.o. male who is status post left femoral to anterior tibial bypass graft on 12/10/2015. This was done for a wound on his left great toe. He has a history of a right femoral to below-knee popliteal bypass graft with vein which has occluded. He was seen on 04/15/2016 and his bypass graft was patent.On 05/20/2016 he came back in the office without a pulse in his bypass graft. He stated that hadbeen going on for approximately 3 weeks. He had also developed a wound on his great toe. On 05/21/2016 he was taken to the operating room for a thrombectomy of his superficial femoral anterior tibial bypass graft. Dr. Myra Gianotti was able to get the bypass graft open, however the vein appeared to be sclerotic. Dr. Rainey Pines angioplasty blindly in the operating room. The following day the patientcontinued to have a pulse within the graft and therefore he wastaken to the Angio suite for arteriogram and additional balloon angioplasty with a 4 mm balloon. He was discharged home on Plavix.  He was scheduled for angiography to evaluate his bypass graft, however when he arrived it was found to be occluded. Because the vein was of such poor quality, Dr. Derrill Center to redo his bypass graft, this time using PTFE down from the common femoral to the anterior tibial artery on 09-20-16. Dr. Myra Gianotti placedretention sutures atthe lateral lower leg because hewas worried about swelling and tension in this area.  He is back today for left LE follow up.    He has numbness at the anterior aspect of his right calf, states since right leg surgery. He sees a pain management clinic, [facility], for chronic DDD and associated pain.  He states that the pain management clinic stopped the xanax in 2017, he took for about 6-7 years, and later  he started having tremors in his head and hands.    About late June of 2018 he started having left calf claudication after walking about 50 yards, he did not have any claudication in his left leg at his visit 3 months ago. This pain is relieved by rest.   At this point he walks about 150 feet and both calves hurt, relieved with rest. He was started on cilostizol at his visit on 03-17-17 to help relive claudication sx's.   His walking is limited by his dyspnea.    He stopped smoking 11-13-16, states because he had too much trouble breathing. He denies a hx of DM. The patient's wounds are healed. The patient is able to complete their activities of daily living.    He denies any hx of stroke or TIA.   Pt Diabetic: No Pt smoker: former smoker, quit March 2018, started smoking at age 73 years  Pt meds include: Statin :Yes Betablocker: No ASA: No Other anticoagulants/antiplatelets: Plavix    Current Outpatient Medications  Medication Sig Dispense Refill  . atorvastatin (LIPITOR) 10 MG tablet Take 1 tablet (10 mg total) by mouth daily. 30 tablet 11  . budesonide (PULMICORT) 0.25 MG/2ML nebulizer solution Take 2 mLs (0.25 mg total) by nebulization every 12 (twelve) hours as needed (shortness or breath). Use one vial twice daily 60 mL 11  . busPIRone (BUSPAR) 7.5 MG tablet TAKE 1 TABLET(7.5 MG) BY MOUTH TWICE DAILY 60 tablet 3  . cilostazol (PLETAL) 100 MG tablet Take 1 tablet (  100 mg total) by mouth 2 (two) times daily before a meal. 60 tablet 6  . formoterol (PERFOROMIST) 20 MCG/2ML nebulizer solution Take 2 mLs (20 mcg total) by nebulization 2 (two) times daily. 60 mL 11  . PROAIR HFA 108 (90 Base) MCG/ACT inhaler      No current facility-administered medications for this visit.     Past Medical History:  Diagnosis Date  . Anxiety   . Chronic low back pain    "degenerative spine dx'd ~ 7 yr ago" (06/08/2013)  . Claudication (HCC)   . Constipation due to pain medication   .  COPD (chronic obstructive pulmonary disease) (HCC)   . Emphysema of lung (HCC)   . Hypertension    "moderately high; RX didn't help" (06/08/2013) - no longer on meds (as of 09/19/16)  . Neuromuscular disorder (HCC)    degen spine  . Non-healing wound of lower extremity 06/09/2013  . PAD (peripheral artery disease), PTA/stent IDEV & chocolate baloon 06/08/13 04/13/2013   Severely reduced ABI's of 0.3 bilaterally   . Shortness of breath    w/ exertion  . Tobacco abuse   . Tobacco use 06/09/2013  . Unexplained weight loss     Social History Social History   Tobacco Use  . Smoking status: Former Smoker    Packs/day: 0.25    Years: 50.00    Pack years: 12.50    Types: Cigarettes    Start date: 11/14/2015    Last attempt to quit: 11/12/2016    Years since quitting: 0.8  . Smokeless tobacco: Never Used  . Tobacco comment: states "he is ready to quit"  Substance Use Topics  . Alcohol use: No    Alcohol/week: 0.0 oz    Comment: 06/08/2013 "quit 02/2007; drank my share before that though"  . Drug use: No    Family History Family History  Problem Relation Age of Onset  . Hyperlipidemia Mother   . Heart disease Mother   . Diabetes Mother   . Cancer Father        Liver    Surgical History Past Surgical History:  Procedure Laterality Date  . ABDOMINAL AORTAGRAM  05/10/2013   Procedure: ABDOMINAL Ronny FlurryAORTAGRAM;  Surgeon: Runell GessJonathan J Berry, MD;  [facility];  Service: Cardiovascular;;  . ABI     04/09/13; abnormal  . ANGIOPLASTY Left 05/21/2016   Procedure: ballon ANGIOPLASTY of femoral to anterior to tibial bypass graft;  Surgeon: Nada LibmanVance W Brabham, MD;  [facility];  Service: Vascular;  Laterality: Left;  . APPENDECTOMY  05/2001  . FEMORAL ARTERY STENT Right 05/10/13   2 stents Rt SFA  . FEMORAL ARTERY STENT Left 06/08/2013  . FEMORAL-POPLITEAL BYPASS GRAFT Right 11/12/2013   Procedure: RIGHT  FEMORAL-BELOW KNEE POPLITEAL ARTERY BYPASS GRAFT USING NON- REVERSE RIGHT GREATER  SAPHENOUS VEIN.;  Surgeon: Nada LibmanVance W Brabham, MD;  Location: [facility] OR;  Service: Vascular;  Laterality: Right;  . FEMORAL-TIBIAL BYPASS GRAFT Left 12/08/2015   Procedure:  LEFT FEMORAL-ANTERIOR TIBIAL ARTERY BYPASS GRAFT;  Surgeon: Nada LibmanVance W Brabham, MD;  [facility];  Service: Vascular;  Laterality: Left;  . FEMORAL-TIBIAL BYPASS GRAFT Left 09/20/2016   Procedure: REDO BYPASS GRAFT FEMORAL-ANTERIOR TIBIAL ARTERY;  Surgeon: Nada LibmanVance W Brabham, MD;  [facility];  Service: Vascular;  Laterality: Left;  . LOWER EXTREMITY ANGIOGRAM Bilateral 05/10/2013   Procedure: LOWER EXTREMITY ANGIOGRAM;  Surgeon: Runell GessJonathan J Berry, MD;  [facility];  Service: Cardiovascular;  Laterality: Bilateral;  . LOWER  EXTREMITY ANGIOGRAM N/A 06/08/2013   Procedure: LOWER EXTREMITY ANGIOGRAM;  Surgeon: Runell Gess, MD;  Location: [facility] CATH LAB;  Service: Cardiovascular;  Laterality: N/A;  . LOWER EXTREMITY ANGIOGRAM N/A 10/28/2013   Procedure: LOWER EXTREMITY ANGIOGRAM;  Surgeon: Runell Gess, MD;  Location: [facility] CATH LAB;  Service: Cardiovascular;  Laterality: N/A;  . LOWER EXTREMITY ANGIOGRAM N/A 05/16/2014   Procedure: LOWER EXTREMITY ANGIOGRAM;  Surgeon: Runell Gess, MD;  Location: [facility] CATH LAB;  Service: Cardiovascular;  Laterality: N/A;  . PERCUTANEOUS STENT INTERVENTION Right 05/10/2013   Procedure: PERCUTANEOUS STENT INTERVENTION;  Surgeon: Runell Gess, MD;  [facility] C[facility] LAB;  Service: Cardiovascular;  Laterality: Right;  rt sfa and popliteal stent x2  . PERIPHERAL VASCULAR CATHETERIZATION N/A 12/06/2015   Procedure: Abdominal Aortogram w/Lower Extremity;  Surgeon: Nada Libman, MD;  [facility]: [facility] INVASIVE CV LAB;  Service: Cardiovascular;  Laterality: N/A;  . PERIPHERAL VASCULAR CATHETERIZATION N/A 05/22/2016   Procedure: Abdominal Aortogram w/Lower Extremity;  Surgeon: Maeola Harman, MD;  [facility]VASIVE CV LAB;  Service: Cardiovascular;  Laterality: N/A;  . PERIPHERAL VASCULAR  CATHETERIZATION N/A 09/17/2016   Procedure: Abdominal Aortogram w/Lower Extremity;  Surgeon: Nada Libman, MD;  Location: [facility] INVASIVE CV LAB;  Service: Cardiovascular;  Laterality: N/A;  . THROMBECTOMY FEMORAL ARTERY Left 05/21/2016   Procedure: THROMBECTOMY FEMORAL TO ANTERIOR TIBIAL BYPASS GRAFT;  Surgeon: Nada Libman, MD;  Location: [facility] OR;  Service: Vascular;  Laterality: Left;  Marland Kitchen VEIN HARVEST Left 12/08/2015   Procedure: USING NON REVERSE  LEFT GREATER SAPHENOUS VEIN;  Surgeon: Nada Libman, MD;  Location: [facility] OR;  Service: Vascular;  Laterality: Left;    Allergies  Allergen Reactions  . Clindamycin/Lincomycin Diarrhea    Current Outpatient Medications  Medication Sig Dispense Refill  . atorvastatin (LIPITOR) 10 MG tablet Take 1 tablet (10 mg total) by mouth daily. 30 tablet 11  . budesonide (PULMICORT) 0.25 MG/2ML nebulizer solution Take 2 mLs (0.25 mg total) by nebulization every 12 (twelve) hours as needed (shortness or breath). Use one vial twice daily 60 mL 11  . busPIRone (BUSPAR) 7.5 MG tablet TAKE 1 TABLET(7.5 MG) BY MOUTH TWICE DAILY 60 tablet 3  . cilostazol (PLETAL) 100 MG tablet Take 1 tablet (100 mg total) by mouth 2 (two) times daily before a meal. 60 tablet 6  . formoterol (PERFOROMIST) 20 MCG/2ML nebulizer solution Take 2 mLs (20 mcg total) by nebulization 2 (two) times daily. 60 mL 11  . PROAIR HFA 108 (90 Base) MCG/ACT inhaler      No current facility-administered medications for this visit.      REVIEW OF SYSTEMS: See HPI for pertinent positives and negatives.  Physical Examination Vitals:   10/06/17 0900 10/06/17 0902  BP: 124/74 130/80  Pulse: (!) 131   Resp: 17   Temp: (!) 97 F (36.1 C)   TempSrc: Oral   SpO2: 93%   Weight: 165 lb 1.6 oz (74.9 kg)    Body mass index is 25.86 kg/m.  General:  A&O x 3, WDWN male. Gait: normal HENT: see skin, otherwise no gross abnormalities  Eyes: PERRLA. Pulmonary: Respirations are non labored, CTAB, good  air movement in all fields  Cardiac: regular rhythm, rechecked rate is 98/minute, no detected murmur.         Carotid Bruits Right Left   Negative Negative   Radial pulses are 2+ palpable bilaterally   Adominal aortic pulse is not palpable  VASCULAR EXAM: Extremities with ischemic changes: left toes are slightly cyanotic, cool, unchanged from previous visit.  without Gangrene; without open wounds.                                                                                                                                                       LE Pulses Right Left       FEMORAL  not  palpable  not palpable        POPLITEAL  not palpable   not palpable       POSTERIOR TIBIAL  not palpable   not palpable        DORSALIS PEDIS      ANTERIOR TIBIAL not palpable  not palpable    Abdomen: soft, NT, right LQ asymptomatic incisional hernia. Skin: no rashes, no ulcers noted. Mild seborrheic dermatitis at face.  Musculoskeletal: no muscle wasting or atrophy.      Neurologic: A&O X 3; Appropriate Affect ; SENSATION: normal; MOTOR FUNCTION:  moving all extremities equally, motor strength 5/5 throughout. Speech is fluent/normal. CN 2-12 intact. Resting fine tremor in head and hands Psychiatric: Rapid stream of consciousness thought content, baseline anxiety. Loquacious.      ASSESSMENT:  Christopher Pena is a 72 y.o. male who iss/p redo left bypass graft using PTFE down from the common femoral to the anterior tibial artery on 09-20-16. Prior to that he is s/p left femoral to anterior tibial bypass graft on 12/10/2015, and thrombectomy of his superficial femoral anterior tibial bypass graft on 05-21-16.   The left LE bypass graft has occluded since his visit on 12-16-16 when it had no stenosis by duplex. By description of his symptoms, this probably occurred in March 2018 when he started having left calf claudication after walking about 50 yards, he did not  have any claudication in his left leg at his visit 3 months prior. This pain is relieved by rest.   At this point he walks about 150 feet and both calves hurt, relieved with rest. He was started on cilostizol at his visit on 03-17-17 to help relive claudication sx's.   His walking is limited by his dyspnea.    He stopped smoking in March 2018, stated reason was that he had too much trouble breathing.  He does not have DM.  Left great toe ulcer is healed, is dry, no gangrene.      DATA  AAA Duplex (10/06/17): Limitations: significant bowel gas. Visualization of the proximal, mid, and distal abdominal aorta, right CIA, and left CIA was limited.  The largest aortic measurement, based on very limited visualization, was 3.0 cm at the mid aorta.   Right CIA: 2.1 cm; Left CIA: 1.3 cm Previous largest abdominal aorta diameter measured 3.6 cm on 11-27-15.   ABI (Date: 10/06/2017):  R:  ABI: 0.54 (was 0.47 on 03-17-17),   PT: mono  DP: absent  TBI:  0.00 (was 0.19)  L:   ABI: 0.50 (was 0.45),   PT: mono  DP: mono  TBI: 0.00 (was 0.15)  Stable bilateral ABI with moderate disease.    Left LE Arterial Duplex (03/17/17): The bypass graft is occluded from the origin to the distal end.  The graft was patent with no restenosis on 12-16-16.   12-16-16 Left Lower Extremity Arterial Duplex: Patent left LE bypass graft with no evidence of restenosis.  This is the first post-operative duplex exam.  11-27-15 AAA Duplex: 3.6 cm mid abdominal aortic aneurysm. Right CIA: 2.0 cm; Left CIA: 1.5 cm  09-17-16 Abdominal arteriogram Aortogram: No significant renal artery stenosis. No aortic stenosis. Bilateral common and external iliac arteries are widely patent Right Lower Extremity: Not evaluated Left Lower Extremity:Left common femoral profunda femoral artery are patent. The bypass graft is not visualized. There is reconstitution of the anterior tibial  and peroneal artery. The anterior tibial is the dominant vessel across the ankle.    PLAN:  Based on the patient's vascular studies and examination, pt will return to clinic in 6 months with ABI's and AAA duplex.  I advised pt to notify us if he develops non healing wound in his feet or legs, and if the symptoms in his legs worsens.  I discussed in depth with the patient the nature of atherosclerosis, and emphasized the importance of maximal medical management including strict control of blood pressure, blood glucose, and lipid levels, obtaining regular exercise, and cessation of smoking.  The patient is aware that without maximal medical management the underlying atherosclerotic disease process will progress, limiting the benefit of any interventions.  Consideration for repair of AAA would be made when the size approaches 5.0 cm, growth > 1 cm/yr, and symptomatic status. The patient was given information about AAA including signs, symptoms, treatment,  what symptoms should prompt the patient to seek immediate medical care, and how to minimize the risk of enlargement and rupture of aneurysms.   The patient was given information about stroke prevention and what symptoms should prompt the patient to seek immediate medical care.  The patient was given information about PAD including signs, symptoms, treatment, what symptoms should prompt the patient to seek immediate medical care, and risk reduction measures to take.  Thank you for allowing [place] to participate in this patient's care.  Charisse March, RN, MSN, FNP-C [facility] Office: [phone]  Clinic MD: Myra Gianotti 10/06/2017 9:19 AM

## 2017-10-06 NOTE — Patient Instructions (Addendum)
Before your next abdominal ultrasound:  Take two Extra-Strength Gas-X capsules at bedtime the night before the test. Take another two Extra-Strength Gas-X capsules 3 hours before the test.  Avoid gas forming foods the day before the test.       Peripheral Vascular Disease Peripheral vascular disease (PVD) is a disease of the blood vessels that are not part of your heart and brain. A simple term for PVD is poor circulation. In most cases, PVD narrows the blood vessels that carry blood from your heart to the rest of your body. This can result in a decreased supply of blood to your arms, legs, and internal organs, like your stomach or kidneys. However, it most often affects a person's lower legs and feet. There are two types of PVD.  Organic PVD. This is the more common type. It is caused by damage to the structure of blood vessels.  Functional PVD. This is caused by conditions that make blood vessels contract and tighten (spasm).  Without treatment, PVD tends to get worse over time. PVD can also lead to acute ischemic limb. This is when an arm or limb suddenly has trouble getting enough blood. This is a medical emergency. Follow these instructions at home:  Take medicines only as told by your doctor.  Do not use any tobacco products, including cigarettes, chewing tobacco, or electronic cigarettes. If you need help quitting, ask your doctor.  Lose weight if you are overweight, and maintain a healthy weight as told by your doctor.  Eat a diet that is low in fat and cholesterol. If you need help, ask your doctor.  Exercise regularly. Ask your doctor for some good activities for you.  Take good care of your feet. ? Wear comfortable shoes that fit well. ? Check your feet often for any cuts or sores. Contact a doctor if:  You have cramps in your legs while walking.  You have leg pain when you are at rest.  You have coldness in a leg or foot.  Your skin changes.  You are unable to  get or have an erection (erectile dysfunction).  You have cuts or sores on your feet that are not healing. Get help right away if:  Your arm or leg turns cold and blue.  Your arms or legs become red, warm, swollen, painful, or numb.  You have chest pain or trouble breathing.  You suddenly have weakness in your face, arm, or leg.  You become very confused or you cannot speak.  You suddenly have a very bad headache.  You suddenly cannot see. This information is not intended to replace advice given to you by your health care provider. Make sure you discuss any questions you have with your health care provider. Document Released: 10/30/2009 Document Revised: 01/11/2016 Document Reviewed: 01/13/2014 Elsevier Interactive Patient Education  2017 [employer]     Abdominal Aortic Aneurysm Blood pumps away from the heart through tubes (blood vessels) called arteries. Aneurysms are weak or damaged places in the wall of an artery. It bulges out like a balloon. An abdominal aortic aneurysm happens in the main artery of the body (aorta). It can burst or tear, causing bleeding inside the body. This is an emergency. It needs treatment right away. What are the causes? The exact cause is unknown. Things that could cause this problem include:  Fat and other substances building up in the lining of a tube.  Swelling of the walls of a blood vessel.  Certain tissue diseases.    Belly (abdominal) trauma.  An infection in the main artery of the body.  What increases the risk? There are things that make it more likely for you to have an aneurysm. These include:  Being over the age of 72 years old.  Having high blood pressure (hypertension).  Being a male.  Being white.  Being very overweight (obese).  Having a family history of aneurysm.  Using tobacco products.  What are the signs or symptoms? Symptoms depend on the size of the aneurysm and how fast it grows. There may not be  symptoms. If symptoms occur, they can include:  Pain (belly, side, lower back, or groin).  Feeling full after eating a small amount of food.  Feeling sick to your stomach (nauseous), throwing up (vomiting), or both.  Feeling a lump in your belly that feels like it is beating (pulsating).  Feeling like you will pass out (faint).  How is this treated?  Medicine to control blood pressure and pain.  Imaging tests to see if the aneurysm gets bigger.  Surgery. How is this prevented? To lessen your chance of getting this condition:  Stop smoking. Stop chewing tobacco.  Limit or avoid alcohol.  Keep your blood pressure, blood sugar, and cholesterol within normal limits.  Eat less salt.  Eat foods low in saturated fats and cholesterol. These are found in animal and whole dairy products.  Eat more fiber. Fiber is found in whole grains, vegetables, and fruits.  Keep a healthy weight.  Stay active and exercise often.  This information is not intended to replace advice given to you by your health care provider. Make sure you discuss any questions you have with your health care provider. Document Released: 11/30/2012 Document Revised: 01/11/2016 Document Reviewed: 09/04/2012 Elsevier Interactive Patient Education  2017 [employer].  

## 2017-10-07 ENCOUNTER — Other Ambulatory Visit: Payer: Self-pay

## 2017-10-07 DIAGNOSIS — I714 Abdominal aortic aneurysm, without rupture, unspecified: Secondary | ICD-10-CM

## 2017-10-07 DIAGNOSIS — I739 Peripheral vascular disease, unspecified: Secondary | ICD-10-CM

## 2017-10-28 ENCOUNTER — Other Ambulatory Visit: Payer: Self-pay | Admitting: *Deleted

## 2017-10-28 MED ORDER — CLOPIDOGREL BISULFATE 75 MG PO TABS: 75.0000 mg | ORAL_TABLET | Freq: Every day | ORAL | 1 refills | Status: DC

## 2017-11-19 ENCOUNTER — Encounter: Payer: Self-pay | Admitting: General Practice

## 2017-11-19 ENCOUNTER — Other Ambulatory Visit: Payer: Self-pay | Admitting: General Practice

## 2017-11-19 MED ORDER — SERTRALINE HCL 25 MG PO TABS: 25.0000 mg | ORAL_TABLET | Freq: Every day | ORAL | 0 refills | Status: DC

## 2017-11-24 DIAGNOSIS — G890 Central pain syndrome: Secondary | ICD-10-CM | POA: Diagnosis not present

## 2017-11-24 DIAGNOSIS — G8929 Other chronic pain: Secondary | ICD-10-CM | POA: Diagnosis not present

## 2017-11-24 DIAGNOSIS — G894 Chronic pain syndrome: Secondary | ICD-10-CM | POA: Diagnosis not present

## 2017-11-24 DIAGNOSIS — M545 Low back pain: Secondary | ICD-10-CM | POA: Diagnosis not present

## 2017-11-24 DIAGNOSIS — M5432 Sciatica, left side: Secondary | ICD-10-CM | POA: Diagnosis not present

## 2017-11-24 DIAGNOSIS — M5431 Sciatica, right side: Secondary | ICD-10-CM | POA: Diagnosis not present

## 2017-11-24 DIAGNOSIS — Z79891 Long term (current) use of opiate analgesic: Secondary | ICD-10-CM | POA: Diagnosis not present

## 2017-12-16 ENCOUNTER — Other Ambulatory Visit: Payer: Self-pay | Admitting: Family Medicine

## 2017-12-19 DIAGNOSIS — M545 Low back pain: Secondary | ICD-10-CM | POA: Diagnosis not present

## 2017-12-24 DIAGNOSIS — G8929 Other chronic pain: Secondary | ICD-10-CM | POA: Diagnosis not present

## 2017-12-24 DIAGNOSIS — Z79891 Long term (current) use of opiate analgesic: Secondary | ICD-10-CM | POA: Diagnosis not present

## 2017-12-24 DIAGNOSIS — M545 Low back pain: Secondary | ICD-10-CM | POA: Diagnosis not present

## 2017-12-24 DIAGNOSIS — M79604 Pain in right leg: Secondary | ICD-10-CM | POA: Diagnosis not present

## 2017-12-24 DIAGNOSIS — G894 Chronic pain syndrome: Secondary | ICD-10-CM | POA: Diagnosis not present

## 2017-12-24 DIAGNOSIS — M79605 Pain in left leg: Secondary | ICD-10-CM | POA: Diagnosis not present

## 2018-01-21 DIAGNOSIS — M79604 Pain in right leg: Secondary | ICD-10-CM | POA: Diagnosis not present

## 2018-01-21 DIAGNOSIS — M545 Low back pain: Secondary | ICD-10-CM | POA: Diagnosis not present

## 2018-01-21 DIAGNOSIS — Z79891 Long term (current) use of opiate analgesic: Secondary | ICD-10-CM | POA: Diagnosis not present

## 2018-01-21 DIAGNOSIS — M79605 Pain in left leg: Secondary | ICD-10-CM | POA: Diagnosis not present

## 2018-01-21 DIAGNOSIS — G8929 Other chronic pain: Secondary | ICD-10-CM | POA: Diagnosis not present

## 2018-01-21 DIAGNOSIS — G894 Chronic pain syndrome: Secondary | ICD-10-CM | POA: Diagnosis not present

## 2018-02-18 DIAGNOSIS — M79605 Pain in left leg: Secondary | ICD-10-CM | POA: Diagnosis not present

## 2018-02-18 DIAGNOSIS — G8929 Other chronic pain: Secondary | ICD-10-CM | POA: Diagnosis not present

## 2018-02-18 DIAGNOSIS — G894 Chronic pain syndrome: Secondary | ICD-10-CM | POA: Diagnosis not present

## 2018-02-18 DIAGNOSIS — Z79891 Long term (current) use of opiate analgesic: Secondary | ICD-10-CM | POA: Diagnosis not present

## 2018-02-18 DIAGNOSIS — M545 Low back pain: Secondary | ICD-10-CM | POA: Diagnosis not present

## 2018-02-18 DIAGNOSIS — M79604 Pain in right leg: Secondary | ICD-10-CM | POA: Diagnosis not present

## 2018-03-18 DIAGNOSIS — G8929 Other chronic pain: Secondary | ICD-10-CM | POA: Diagnosis not present

## 2018-03-18 DIAGNOSIS — G894 Chronic pain syndrome: Secondary | ICD-10-CM | POA: Diagnosis not present

## 2018-03-18 DIAGNOSIS — M79605 Pain in left leg: Secondary | ICD-10-CM | POA: Diagnosis not present

## 2018-03-18 DIAGNOSIS — Z79891 Long term (current) use of opiate analgesic: Secondary | ICD-10-CM | POA: Diagnosis not present

## 2018-03-18 DIAGNOSIS — M545 Low back pain: Secondary | ICD-10-CM | POA: Diagnosis not present

## 2018-03-18 DIAGNOSIS — M79604 Pain in right leg: Secondary | ICD-10-CM | POA: Diagnosis not present

## 2018-03-28 ENCOUNTER — Other Ambulatory Visit: Payer: Self-pay | Admitting: Family

## 2018-04-01 ENCOUNTER — Other Ambulatory Visit: Payer: Self-pay | Admitting: *Deleted

## 2018-04-01 MED ORDER — CILOSTAZOL 100 MG PO TABS: 100.0000 mg | ORAL_TABLET | Freq: Two times a day (BID) | ORAL | 6 refills | Status: DC

## 2018-04-06 ENCOUNTER — Encounter (HOSPITAL_COMMUNITY): Payer: Medicare Other

## 2018-04-06 ENCOUNTER — Other Ambulatory Visit (HOSPITAL_COMMUNITY): Payer: Medicare Other

## 2018-04-06 ENCOUNTER — Ambulatory Visit: Payer: Medicare Other | Admitting: Family

## 2018-04-15 DIAGNOSIS — M79605 Pain in left leg: Secondary | ICD-10-CM | POA: Diagnosis not present

## 2018-04-15 DIAGNOSIS — G894 Chronic pain syndrome: Secondary | ICD-10-CM | POA: Diagnosis not present

## 2018-04-15 DIAGNOSIS — M79604 Pain in right leg: Secondary | ICD-10-CM | POA: Diagnosis not present

## 2018-04-15 DIAGNOSIS — M545 Low back pain: Secondary | ICD-10-CM | POA: Diagnosis not present

## 2018-04-15 DIAGNOSIS — Z79891 Long term (current) use of opiate analgesic: Secondary | ICD-10-CM | POA: Diagnosis not present

## 2018-04-20 ENCOUNTER — Other Ambulatory Visit: Payer: Self-pay | Admitting: Family Medicine

## 2018-04-22 ENCOUNTER — Other Ambulatory Visit: Payer: Self-pay | Admitting: Surgery

## 2018-05-13 DIAGNOSIS — Z79891 Long term (current) use of opiate analgesic: Secondary | ICD-10-CM | POA: Diagnosis not present

## 2018-05-13 DIAGNOSIS — M79604 Pain in right leg: Secondary | ICD-10-CM | POA: Diagnosis not present

## 2018-05-13 DIAGNOSIS — M79605 Pain in left leg: Secondary | ICD-10-CM | POA: Diagnosis not present

## 2018-05-13 DIAGNOSIS — M545 Low back pain: Secondary | ICD-10-CM | POA: Diagnosis not present

## 2018-05-13 DIAGNOSIS — G894 Chronic pain syndrome: Secondary | ICD-10-CM | POA: Diagnosis not present

## 2018-05-13 DIAGNOSIS — G8929 Other chronic pain: Secondary | ICD-10-CM | POA: Diagnosis not present

## 2018-05-18 ENCOUNTER — Other Ambulatory Visit: Payer: Self-pay | Admitting: Family Medicine

## 2018-05-19 ENCOUNTER — Other Ambulatory Visit: Payer: Self-pay | Admitting: Family Medicine

## 2018-06-13 ENCOUNTER — Other Ambulatory Visit: Payer: Self-pay | Admitting: Family Medicine

## 2018-06-17 DIAGNOSIS — M545 Low back pain: Secondary | ICD-10-CM | POA: Diagnosis not present

## 2018-06-17 DIAGNOSIS — G8929 Other chronic pain: Secondary | ICD-10-CM | POA: Diagnosis not present

## 2018-06-17 DIAGNOSIS — M79605 Pain in left leg: Secondary | ICD-10-CM | POA: Diagnosis not present

## 2018-06-17 DIAGNOSIS — G894 Chronic pain syndrome: Secondary | ICD-10-CM | POA: Diagnosis not present

## 2018-06-17 DIAGNOSIS — Z79891 Long term (current) use of opiate analgesic: Secondary | ICD-10-CM | POA: Diagnosis not present

## 2018-06-17 DIAGNOSIS — M79604 Pain in right leg: Secondary | ICD-10-CM | POA: Diagnosis not present

## 2018-06-29 ENCOUNTER — Ambulatory Visit (INDEPENDENT_AMBULATORY_CARE_PROVIDER_SITE_OTHER)
Admission: RE | Admit: 2018-06-29 | Discharge: 2018-06-29 | Disposition: A | Discharge status: Home / Self Care | Payer: Medicare Other | Source: Ambulatory Visit | Attending: Family | Admitting: Family

## 2018-06-29 ENCOUNTER — Encounter: Payer: Self-pay | Admitting: Family

## 2018-06-29 ENCOUNTER — Ambulatory Visit (HOSPITAL_COMMUNITY)
Admission: RE | Admit: 2018-06-29 | Discharge: 2018-06-29 | Disposition: A | Discharge status: Home / Self Care | Payer: Medicare Other | Source: Ambulatory Visit | Attending: Family | Admitting: Family

## 2018-06-29 ENCOUNTER — Ambulatory Visit: Payer: Medicare Other | Admitting: Family

## 2018-06-29 ENCOUNTER — Other Ambulatory Visit: Payer: Self-pay

## 2018-06-29 VITALS — BP 124/76 | HR 78 | Temp 97.6°F | Resp 20 | Ht 67.0 in | Wt 159.0 lb

## 2018-06-29 DIAGNOSIS — Z95828 Presence of other vascular implants and grafts: Secondary | ICD-10-CM

## 2018-06-29 DIAGNOSIS — I714 Abdominal aortic aneurysm, without rupture, unspecified: Secondary | ICD-10-CM

## 2018-06-29 DIAGNOSIS — I779 Disorder of arteries and arterioles, unspecified: Secondary | ICD-10-CM | POA: Diagnosis not present

## 2018-06-29 DIAGNOSIS — I739 Peripheral vascular disease, unspecified: Secondary | ICD-10-CM | POA: Diagnosis not present

## 2018-06-29 DIAGNOSIS — Z87891 Personal history of nicotine dependence: Secondary | ICD-10-CM | POA: Diagnosis not present

## 2018-06-29 NOTE — Patient Instructions (Signed)
Before your next abdominal ultrasound:  Avoid gas forming foods and beverages the day before the test.   Take two Extra-Strength Gas-X capsules at bedtime the night before the test. Take another two Extra-Strength Gas-X capsules in the middle of the night if you get up to the restroom, if not, first thing in the morning with water.  Do not chew gum.       Abdominal Aortic Aneurysm Blood pumps away from the heart through tubes (blood vessels) called arteries. Aneurysms are weak or damaged places in the wall of an artery. It bulges out like a balloon. An abdominal aortic aneurysm happens in the main artery of the body (aorta). It can burst or tear, causing bleeding inside the body. This is an emergency. It needs treatment right away. What are the causes? The exact cause is unknown. Things that could cause this problem include:  Fat and other substances building up in the lining of a tube.  Swelling of the walls of a blood vessel.  Certain tissue diseases.  Belly (abdominal) trauma.  An infection in the main artery of the body.  What increases the risk? There are things that make it more likely for you to have an aneurysm. These include:  Being over the age of 72 years old.  Having high blood pressure (hypertension).  Being a male.  Being white.  Being very overweight (obese).  Having a family history of aneurysm.  Using tobacco products.  What are the signs or symptoms? Symptoms depend on the size of the aneurysm and how fast it grows. There may not be symptoms. If symptoms occur, they can include:  Pain (belly, side, lower back, or groin).  Feeling full after eating a small amount of food.  Feeling sick to your stomach (nauseous), throwing up (vomiting), or both.  Feeling a lump in your belly that feels like it is beating (pulsating).  Feeling like you will pass out (faint).  How is this treated?  Medicine to control blood pressure and pain.  Imaging tests  to see if the aneurysm gets bigger.  Surgery. How is this prevented? To lessen your chance of getting this condition:  Stop smoking. Stop chewing tobacco.  Limit or avoid alcohol.  Keep your blood pressure, blood sugar, and cholesterol within normal limits.  Eat less salt.  Eat foods low in saturated fats and cholesterol. These are found in animal and whole dairy products.  Eat more fiber. Fiber is found in whole grains, vegetables, and fruits.  Keep a healthy weight.  Stay active and exercise often.  This information is not intended to replace advice given to you by your health care provider. Make sure you discuss any questions you have with your health care provider. Document Released: 11/30/2012 Document Revised: 01/11/2016 Document Reviewed: 09/04/2012 Elsevier Interactive Patient Education  2017 Elsevier Inc.     Peripheral Vascular Disease Peripheral vascular disease (PVD) is a disease of the blood vessels that are not part of your heart and brain. A simple term for PVD is poor circulation. In most cases, PVD narrows the blood vessels that carry blood from your heart to the rest of your body. This can result in a decreased supply of blood to your arms, legs, and internal organs, like your stomach or kidneys. However, it most often affects a person's lower legs and feet. There are two types of PVD.  Organic PVD. This is the more common type. It is caused by damage to the structure of blood vessels.    Functional PVD. This is caused by conditions that make blood vessels contract and tighten (spasm).  Without treatment, PVD tends to get worse over time. PVD can also lead to acute ischemic limb. This is when an arm or limb suddenly has trouble getting enough blood. This is a medical emergency. Follow these instructions at home:  Take medicines only as told by your doctor.  Do not use any tobacco products, including cigarettes, chewing tobacco, or electronic cigarettes. If  you need help quitting, ask your doctor.  Lose weight if you are overweight, and maintain a healthy weight as told by your doctor.  Eat a diet that is low in fat and cholesterol. If you need help, ask your doctor.  Exercise regularly. Ask your doctor for some good activities for you.  Take good care of your feet. ? Wear comfortable shoes that fit well. ? Check your feet often for any cuts or sores. Contact a doctor if:  You have cramps in your legs while walking.  You have leg pain when you are at rest.  You have coldness in a leg or foot.  Your skin changes.  You are unable to get or have an erection (erectile dysfunction).  You have cuts or sores on your feet that are not healing. Get help right away if:  Your arm or leg turns cold and blue.  Your arms or legs become red, warm, swollen, painful, or numb.  You have chest pain or trouble breathing.  You suddenly have weakness in your face, arm, or leg.  You become very confused or you cannot speak.  You suddenly have a very bad headache.  You suddenly cannot see. This information is not intended to replace advice given to you by your health care provider. Make sure you discuss any questions you have with your health care provider. Document Released: 10/30/2009 Document Revised: 01/11/2016 Document Reviewed: 01/13/2014 Elsevier Interactive Patient Education  2017 Elsevier Inc.  

## 2018-06-29 NOTE — Progress Notes (Signed)
[facility] [place] HISTORY AND PHYSICAL   CC: Follow up peripheral artery occlusive disease and AAA    History of Present Illness:   Christopher Pena is a 72 y.o. male who is status post left femoral to anterior tibial bypass graft on 12/10/2015. This was done for a wound on his left great toe. He has a history of a right femoral to below-knee popliteal bypass graft with vein which has occluded. He was seen on 04/15/2016 and his bypass graft was patent.On 05/20/2016 he came back in the office without a pulse in his bypass graft. He stated that hadbeen going on for approximately 3 weeks. He had also developed a wound on his great toe. On 05/21/2016 he was taken to the operating room for a thrombectomy of his superficial femoral anterior tibial bypass graft. Dr. Myra Gianotti was able to get the bypass graft open, however the vein appeared to be sclerotic. Dr. Rainey Pines angioplasty blindly in the operating room. The following day the patientcontinued to have a pulse within the graft and therefore he wastaken to the Angio suite for arteriogram and additional balloon angioplasty with a 4 mm balloon. He was discharged home on Plavix.  He was scheduled for angiography to evaluate his bypass graft, however when he arrived it was found to be occluded. Because the vein was of such poor quality, Dr. Derrill Center to redo his bypass graft, this time using PTFE down from the common femoral to the anterior tibial artery on 09-20-16. Dr. Myra Gianotti placedretention sutures atthe lateral lower leg because hewas worried about swelling and tension in this area.  He has numbness at the anterior aspect of his right calf, states since right leg surgery. He sees a pain management clinic, [facility], for chronic DDD and associated pain.  He states that the pain management clinic stopped the xanax in 2017, he took for about 6-7 years, and later he started having tremors in his head and  hands.  About late June of 2018 he started having left calf claudication after walking about 50 yards, he did not have any claudication in his left leg at his visit 3 months ago. This pain is relieved by rest.  At this point he walks about 150 feet and both calves hurt, relieved with rest, this has not changed since his last visit. He was started on cilostizol at his visit on 03-17-17 to help relive claudication sx's.   His walking is limited by his dyspnea and claudication.    He stopped smoking 11-13-16, states because he had too much trouble breathing. He denies a hx of DM. The patient's wounds are healed. The patient is able to complete their activities of daily living.  He denies any hx of stroke or TIA.  Diabetic:No Tobacco NFA:OZHYQM smoker, quitMarch 2018, started smoking at age 79years  Pt meds include: Statin :Yes Betablocker:No ASA:No Other anticoagulants/antiplatelets:Plavix     Current Outpatient Medications  Medication Sig Dispense Refill  . atorvastatin (LIPITOR) 10 MG tablet Take 1 tablet (10 mg total) by mouth daily. 30 tablet 11  . budesonide (PULMICORT) 0.25 MG/2ML nebulizer solution Take 2 mLs (0.25 mg total) by nebulization every 12 (twelve) hours as needed (shortness or breath). Use one vial twice daily 60 mL 11  . busPIRone (BUSPAR) 7.5 MG tablet TAKE ONE TABLET TWICE A DAY 60 tablet 0  . cilostazol (PLETAL) 100 MG tablet Take 1 tablet (100 mg total) by mouth 2 (two) times daily before a meal. 60 tablet 6  .  clopidogrel (PLAVIX) 75 MG tablet TAKE 1 TABLET BY MOUTH EVERY DAY 90 tablet 1  . formoterol (PERFOROMIST) 20 MCG/2ML nebulizer solution Take 2 mLs (20 mcg total) by nebulization 2 (two) times daily. 60 mL 11  . PROAIR HFA 108 (90 Base) MCG/ACT inhaler     . sertraline (ZOLOFT) 25 MG tablet TAKE 1 TABLET BY MOUTH EVERY DAY 90 tablet 1   No current facility-administered medications for this visit.     Past Medical History:  Diagnosis  Date  . Anxiety   . Chronic low back pain    "degenerative spine dx'd ~ 7 yr ago" (06/08/2013)  . Claudication (HCC)   . Constipation due to pain medication   . COPD (chronic obstructive pulmonary disease) (HCC)   . Emphysema of lung (HCC)   . Hypertension    "moderately high; RX didn't help" (06/08/2013) - no longer on meds (as of 09/19/16)  . Neuromuscular disorder (HCC)    degen spine  . Non-healing wound of lower extremity 06/09/2013  . PAD (peripheral artery disease), PTA/stent IDEV & chocolate baloon 06/08/13 04/13/2013   Severely reduced ABI's of 0.3 bilaterally   . Shortness of breath    w/ exertion  . Tobacco abuse   . Tobacco use 06/09/2013  . Unexplained weight loss     Social History Social History   Tobacco Use  . Smoking status: Former Smoker    Packs/day: 0.25    Years: 50.00    Pack years: 12.50    Types: Cigarettes    Start date: 11/14/2015    Last attempt to quit: 11/13/2016    Years since quitting: 1.6  . Smokeless tobacco: Never Used  Substance Use Topics  . Alcohol use: No    Alcohol/week: 0.0 standard drinks    Comment: 06/08/2013 "quit 02/2007; drank my share before that though"  . Drug use: No    Family History Family History  Problem Relation Age of Onset  . Hyperlipidemia Mother   . Heart disease Mother   . Diabetes Mother   . Cancer Father        Liver    Surgical History Past Surgical History:  Procedure Laterality Date  . ABDOMINAL AORTAGRAM  05/10/2013   Procedure: ABDOMINAL Ronny Flurry;  Surgeon: Runell Gess, MD;  Location: [facility] CATH LAB;  Service: Cardiovascular;;  . ABI     04/09/13; abnormal  . ANGIOPLASTY Left 05/21/2016   Procedure: ballon ANGIOPLASTY of femoral to anterior to tibial bypass graft;  Surgeon: Nada Libman, MD;  Location: [facility] OR;  Service: Vascular;  Laterality: Left;  . APPENDECTOMY  05/2001  . FEMORAL ARTERY STENT Right 05/10/13   2 stents Rt SFA  . FEMORAL ARTERY STENT Left 06/08/2013  . FEMORAL-POPLITEAL  BYPASS GRAFT Right 11/12/2013   Procedure: RIGHT  FEMORAL-BELOW KNEE POPLITEAL ARTERY BYPASS GRAFT USING NON- REVERSE RIGHT GREATER SAPHENOUS VEIN.;  Surgeon: Nada Libman, MD;  Location: [facility] OR;  Service: Vascular;  Laterality: Right;  . FEMORAL-TIBIAL BYPASS GRAFT Left 12/08/2015   Procedure:  LEFT FEMORAL-ANTERIOR TIBIAL ARTERY BYPASS GRAFT;  Surgeon: Nada Libman, MD;  Location: [facility] OR;  Service: Vascular;  Laterality: Left;  . FEMORAL-TIBIAL BYPASS GRAFT Left 09/20/2016   Procedure: REDO BYPASS GRAFT FEMORAL-ANTERIOR TIBIAL ARTERY;  Surgeon: Nada Libman, MD;  Location: [facility] OR;  Service: Vascular;  Laterality: Left;  . LOWER EXTREMITY ANGIOGRAM Bilateral 05/10/2013   Procedure: LOWER EXTREMITY ANGIOGRAM;  Surgeon: Runell Gess, MD;  Location: [facility] CATH LAB;  Service: Cardiovascular;  Laterality: Bilateral;  . LOWER EXTREMITY ANGIOGRAM N/A 06/08/2013   Procedure: LOWER EXTREMITY ANGIOGRAM;  Surgeon: Runell Gess, MD;  Location: [facility] CATH LAB;  Service: Cardiovascular;  Laterality: N/A;  . LOWER EXTREMITY ANGIOGRAM N/A 10/28/2013   Procedure: LOWER EXTREMITY ANGIOGRAM;  Surgeon: Runell Gess, MD;  Location: [facility] CATH LAB;  Service: Cardiovascular;  Laterality: N/A;  . LOWER EXTREMITY ANGIOGRAM N/A 05/16/2014   Procedure: LOWER EXTREMITY ANGIOGRAM;  Surgeon: Runell Gess, MD;  Location: [facility] CATH LAB;  Service: Cardiovascular;  Laterality: N/A;  . PERCUTANEOUS STENT INTERVENTION Right 05/10/2013   Procedure: PERCUTANEOUS STENT INTERVENTION;  Surgeon: Runell Gess, MD;  Location: [facility] CATH LAB;  Service: Cardiovascular;  Laterality: Right;  rt sfa and popliteal stent x2  . PERIPHERAL VASCULAR CATHETERIZATION N/A 12/06/2015   Procedure: Abdominal Aortogram w/Lower Extremity;  Surgeon: Nada Libman, MD;  Location: [facility] INVASIVE CV LAB;  Service: Cardiovascular;  Laterality: N/A;  . PERIPHERAL VASCULAR CATHETERIZATION N/A 05/22/2016   Procedure: Abdominal Aortogram w/Lower Extremity;   Surgeon: Maeola Harman, MD;  Location: [facility] INVASIVE CV LAB;  Service: Cardiovascular;  Laterality: N/A;  . PERIPHERAL VASCULAR CATHETERIZATION N/A 09/17/2016   Procedure: Abdominal Aortogram w/Lower Extremity;  Surgeon: Nada Libman, MD;  Location: [facility] INVASIVE CV LAB;  Service: Cardiovascular;  Laterality: N/A;  . THROMBECTOMY FEMORAL ARTERY Left 05/21/2016   Procedure: THROMBECTOMY FEMORAL TO ANTERIOR TIBIAL BYPASS GRAFT;  Surgeon: Nada Libman, MD;  Location: [facility] OR;  Service: Vascular;  Laterality: Left;  Marland Kitchen VEIN HARVEST Left 12/08/2015   Procedure: USING NON REVERSE  LEFT GREATER SAPHENOUS VEIN;  Surgeon: Nada Libman, MD;  Location: [facility] OR;  Service: Vascular;  Laterality: Left;    Allergies  Allergen Reactions  . Clindamycin/Lincomycin Diarrhea    Current Outpatient Medications  Medication Sig Dispense Refill  . atorvastatin (LIPITOR) 10 MG tablet Take 1 tablet (10 mg total) by mouth daily. 30 tablet 11  . budesonide (PULMICORT) 0.25 MG/2ML nebulizer solution Take 2 mLs (0.25 mg total) by nebulization every 12 (twelve) hours as needed (shortness or breath). Use one vial twice daily 60 mL 11  . busPIRone (BUSPAR) 7.5 MG tablet TAKE ONE TABLET TWICE A DAY 60 tablet 0  . cilostazol (PLETAL) 100 MG tablet Take 1 tablet (100 mg total) by mouth 2 (two) times daily before a meal. 60 tablet 6  . clopidogrel (PLAVIX) 75 MG tablet TAKE 1 TABLET BY MOUTH EVERY DAY 90 tablet 1  . formoterol (PERFOROMIST) 20 MCG/2ML nebulizer solution Take 2 mLs (20 mcg total) by nebulization 2 (two) times daily. 60 mL 11  . PROAIR HFA 108 (90 Base) MCG/ACT inhaler     . sertraline (ZOLOFT) 25 MG tablet TAKE 1 TABLET BY MOUTH EVERY DAY 90 tablet 1   No current facility-administered medications for this visit.      REVIEW OF SYSTEMS: See HPI for pertinent positives and negatives.  Physical Examination Vitals:   06/29/18 0957 06/29/18 0959  BP: 129/74 124/76  Pulse: 78   Resp: 20   Temp: 97.6  F (36.4 C)   TempSrc: Oral   SpO2: 91%   Weight: 159 lb (72.1 kg)   Height: 5\' 7"  (1.702 m)    Body mass index is 24.9 kg/m.  General: A&O x 3, WDWNmale. Gait:normal HENT: see skin, otherwise no gross abnormalities  Eyes: PERRLA. Pulmonary: Respirations are non labored, CTAB, good air movement in all fields  Cardiac:regularrhythm and rate, no detected murmur.  Carotid Bruits Right Left   Negative Negative   Radial pulses are2+palpable bilaterally  Adominal aortic pulse isnotpalpable   VASCULAR EXAM: Extremitieswithischemic changes: left toes are slightly ruddy,cool, dry. withoutGangrene; withoutopen wounds. Thick left great toenail.   LE Pulses Right Left  FEMORAL not palpable notpalpable   POPLITEAL notpalpable  notpalpable  POSTERIOR TIBIAL notpalpable  notpalpable   DORSALIS PEDIS ANTERIOR TIBIAL notpalpable  notpalpable    Abdomen: soft, NT, right LQ asymptomatic incisional hernia. Skin: no rashes, no ulcers noted.Mild seborrheic dermatitis at face. Musculoskeletal: no muscle wasting or atrophy. Neurologic: A&O X 3; Appropriate Affect ; SENSATION: normal; MOTOR FUNCTION: moving all extremities equally, motor strength 5/5 throughout. Speech is fluent/normal. CN 2-12 intact except has mild hearing loss. Resting fine tremor in head and hands Psychiatric: Rapid stream of consciousness thought content, baseline anxiety. Loquacious.    ASSESSMENT:  Christopher Pena is a 73 y.o. male who iss/p redoleftbypass graft using PTFE down from the common femoral to the anterior tibial artery on 09-20-16. Prior to that he is s/p left femoral to anterior tibial bypass graft on 12/10/2015, and thrombectomy of his superficial femoral anterior tibial bypass graft on 05-21-16.   The left LE bypass grafthas occluded since his visit on 12-16-16 when it had no stenosis by  duplex.By description of his symptoms, this probably occurred in March 2018 when hestarted having left calf claudication after walking about 50 yards, he did not have any claudication in his left leg at his visit 3 monthsprior.This pain is relieved by rest.   At this point he walks about 150 feet and both calves hurt, relieved with rest. He was started on cilostizol at his visit on 03-17-17 to help relive claudication sx's.   His walking is limited by his dyspnea.    He stopped smokingin March 2018, stated reason was that he had too much trouble breathing.  He does not have DM.  Left great toe ulcer ishealed, is dry, no gangrene.   I offered to refer him to a podiatrist to trim his thick left great toenail on a regular basis, states he has seen a podiatrist in the past and will contact him or her.     DATA  AAA Duplex: Previous (10-06-17): 3.0 cm, Right CIA: 2.1 cm, Left CIA: 1.3 cm. Visualization limited by bowel gas. Previous largest abdominal aorta diameter measured 3.6 cm on 11-27-15.   Current (06-29-18): 3.2 cm; Right CIA: 1.9 cm; Left CIA: 1.6 cm  ABI (Date: 06/29/2018):  R:   ABI: 0.54 (was 0.54 on 10-06-17),   PT: mono  DP: mono (was absent)  TBI:  0, toe pressure 0, (was 0)  L:   ABI: 0.66 (was 0.50),   PT: mono  DP: mono  TBI: 0.12, toe pressure 19, (was 0) Stable on the right with moderate disease, improved on the left with moderate disease, all monophasic waveforms.    Left LE Arterial Duplex (03/17/17): The bypass graft is occluded from the origin to the distal end.  The graft was patent with no restenosis on 12-16-16.  12-16-16 Left Lower Extremity Arterial Duplex: Patent left LE bypass graft with no evidence of restenosis.  This is the first post-operative duplex exam.  09-17-16 Abdominal arteriogram Aortogram: No significant renal artery stenosis. No aortic stenosis. Bilateral common and external iliac arteries are widely  patent Right Lower Extremity: Not evaluated Left Lower Extremity:Left common femoral profunda femoral artery are patent. The bypass graft is not visualized. There is reconstitution of the  anterior tibial and peroneal artery. The anterior tibial is the dominant vessel across the ankle.    PLAN:  Walk at least 30 minutes total daily.   Based on the patient's vascular studies and examination,pt will return to clinic in 6 monthswith ABI's,  AAA duplex in a year.  I advised pt to notify us if he develops non healing wound in his feet or legs, and if the symptoms in his legs worsens.  I discussed in depth with the patient the nature of atherosclerosis, and emphasized the importance of maximal medical management including strict control of blood pressure, blood glucose, and lipid levels, obtaining regular exercise, and cessation of smoking.  The patient is aware that without maximal medical management the underlying atherosclerotic disease process will progress, limiting the benefit of any interventions.  Consideration for repair of AAA would be made when the size approaches 5.0 cm, growth > 1 cm/yr, and symptomatic status. The patient was given information about AAA including signs, symptoms, treatment,  what symptoms should prompt the patient to seek immediate medical care, and how to minimize the risk of enlargement and rupture of aneurysms.   The patient was given information about PAD including signs, symptoms, treatment, what symptoms should prompt the patient to seek immediate medical care, and risk reduction measures to take.  Thank you for allowing [place] to participate in this patient's care.  Charisse March, RN, MSN, FNP-C [facility] Office: [phone]  Clinic MD: Myra Gianotti 06/29/2018 10:12 AM

## 2018-07-15 DIAGNOSIS — M79604 Pain in right leg: Secondary | ICD-10-CM | POA: Diagnosis not present

## 2018-07-15 DIAGNOSIS — G894 Chronic pain syndrome: Secondary | ICD-10-CM | POA: Diagnosis not present

## 2018-07-15 DIAGNOSIS — M545 Low back pain: Secondary | ICD-10-CM | POA: Diagnosis not present

## 2018-07-15 DIAGNOSIS — G8929 Other chronic pain: Secondary | ICD-10-CM | POA: Diagnosis not present

## 2018-07-15 DIAGNOSIS — M79605 Pain in left leg: Secondary | ICD-10-CM | POA: Diagnosis not present

## 2018-07-15 DIAGNOSIS — Z79891 Long term (current) use of opiate analgesic: Secondary | ICD-10-CM | POA: Diagnosis not present

## 2018-08-11 DIAGNOSIS — M79604 Pain in right leg: Secondary | ICD-10-CM | POA: Diagnosis not present

## 2018-08-11 DIAGNOSIS — M79605 Pain in left leg: Secondary | ICD-10-CM | POA: Diagnosis not present

## 2018-08-11 DIAGNOSIS — M545 Low back pain: Secondary | ICD-10-CM | POA: Diagnosis not present

## 2018-08-11 DIAGNOSIS — M5432 Sciatica, left side: Secondary | ICD-10-CM | POA: Diagnosis not present

## 2018-08-11 DIAGNOSIS — G8929 Other chronic pain: Secondary | ICD-10-CM | POA: Diagnosis not present

## 2018-08-11 DIAGNOSIS — Z79891 Long term (current) use of opiate analgesic: Secondary | ICD-10-CM | POA: Diagnosis not present

## 2018-08-11 DIAGNOSIS — G894 Chronic pain syndrome: Secondary | ICD-10-CM | POA: Diagnosis not present

## 2018-09-08 DIAGNOSIS — G894 Chronic pain syndrome: Secondary | ICD-10-CM | POA: Diagnosis not present

## 2018-09-08 DIAGNOSIS — M79604 Pain in right leg: Secondary | ICD-10-CM | POA: Diagnosis not present

## 2018-09-08 DIAGNOSIS — M79605 Pain in left leg: Secondary | ICD-10-CM | POA: Diagnosis not present

## 2018-09-08 DIAGNOSIS — Z79891 Long term (current) use of opiate analgesic: Secondary | ICD-10-CM | POA: Diagnosis not present

## 2018-09-08 DIAGNOSIS — M5432 Sciatica, left side: Secondary | ICD-10-CM | POA: Diagnosis not present

## 2018-09-08 DIAGNOSIS — M545 Low back pain: Secondary | ICD-10-CM | POA: Diagnosis not present

## 2018-09-08 DIAGNOSIS — G8929 Other chronic pain: Secondary | ICD-10-CM | POA: Diagnosis not present

## 2018-10-09 DIAGNOSIS — M79605 Pain in left leg: Secondary | ICD-10-CM | POA: Diagnosis not present

## 2018-10-09 DIAGNOSIS — M79604 Pain in right leg: Secondary | ICD-10-CM | POA: Diagnosis not present

## 2018-10-09 DIAGNOSIS — Z79891 Long term (current) use of opiate analgesic: Secondary | ICD-10-CM | POA: Diagnosis not present

## 2018-10-09 DIAGNOSIS — M545 Low back pain: Secondary | ICD-10-CM | POA: Diagnosis not present

## 2018-10-09 DIAGNOSIS — M5432 Sciatica, left side: Secondary | ICD-10-CM | POA: Diagnosis not present

## 2018-10-09 DIAGNOSIS — G894 Chronic pain syndrome: Secondary | ICD-10-CM | POA: Diagnosis not present

## 2018-10-09 DIAGNOSIS — G8929 Other chronic pain: Secondary | ICD-10-CM | POA: Diagnosis not present

## 2018-10-15 ENCOUNTER — Other Ambulatory Visit: Payer: Self-pay | Admitting: Family Medicine

## 2018-10-25 ENCOUNTER — Other Ambulatory Visit: Payer: Self-pay | Admitting: Family Medicine

## 2018-11-05 DIAGNOSIS — M79605 Pain in left leg: Secondary | ICD-10-CM | POA: Diagnosis not present

## 2018-11-05 DIAGNOSIS — M545 Low back pain: Secondary | ICD-10-CM | POA: Diagnosis not present

## 2018-11-05 DIAGNOSIS — Z79891 Long term (current) use of opiate analgesic: Secondary | ICD-10-CM | POA: Diagnosis not present

## 2018-11-05 DIAGNOSIS — G894 Chronic pain syndrome: Secondary | ICD-10-CM | POA: Diagnosis not present

## 2018-11-05 DIAGNOSIS — M79604 Pain in right leg: Secondary | ICD-10-CM | POA: Diagnosis not present

## 2018-11-05 DIAGNOSIS — G8929 Other chronic pain: Secondary | ICD-10-CM | POA: Diagnosis not present

## 2018-11-05 DIAGNOSIS — M5432 Sciatica, left side: Secondary | ICD-10-CM | POA: Diagnosis not present

## 2018-11-10 ENCOUNTER — Other Ambulatory Visit: Payer: Self-pay | Admitting: Family Medicine

## 2018-12-03 DIAGNOSIS — G894 Chronic pain syndrome: Secondary | ICD-10-CM | POA: Diagnosis not present

## 2018-12-03 DIAGNOSIS — M5432 Sciatica, left side: Secondary | ICD-10-CM | POA: Diagnosis not present

## 2018-12-03 DIAGNOSIS — M5431 Sciatica, right side: Secondary | ICD-10-CM | POA: Diagnosis not present

## 2018-12-03 DIAGNOSIS — M79605 Pain in left leg: Secondary | ICD-10-CM | POA: Diagnosis not present

## 2018-12-03 DIAGNOSIS — M545 Low back pain: Secondary | ICD-10-CM | POA: Diagnosis not present

## 2018-12-03 DIAGNOSIS — G8929 Other chronic pain: Secondary | ICD-10-CM | POA: Diagnosis not present

## 2018-12-03 DIAGNOSIS — Z79891 Long term (current) use of opiate analgesic: Secondary | ICD-10-CM | POA: Diagnosis not present

## 2018-12-03 DIAGNOSIS — M79604 Pain in right leg: Secondary | ICD-10-CM | POA: Diagnosis not present

## 2018-12-10 ENCOUNTER — Other Ambulatory Visit: Payer: Self-pay | Admitting: Family Medicine

## 2018-12-17 ENCOUNTER — Emergency Department (HOSPITAL_COMMUNITY): Payer: Medicare Other

## 2018-12-17 ENCOUNTER — Encounter (HOSPITAL_COMMUNITY): Payer: Self-pay | Admitting: Internal Medicine

## 2018-12-17 ENCOUNTER — Inpatient Hospital Stay (HOSPITAL_COMMUNITY)
Admission: EM | Admit: 2018-12-17 | Discharge: 2018-12-24 | DRG: 871 | Disposition: A | Discharge status: Home / Self Care | Payer: Medicare Other | Attending: Family Medicine | Admitting: Family Medicine

## 2018-12-17 ENCOUNTER — Other Ambulatory Visit: Payer: Self-pay

## 2018-12-17 DIAGNOSIS — J189 Pneumonia, unspecified organism: Secondary | ICD-10-CM

## 2018-12-17 DIAGNOSIS — Z20828 Contact with and (suspected) exposure to other viral communicable diseases: Secondary | ICD-10-CM | POA: Diagnosis present

## 2018-12-17 DIAGNOSIS — E860 Dehydration: Secondary | ICD-10-CM | POA: Diagnosis not present

## 2018-12-17 DIAGNOSIS — R0602 Shortness of breath: Secondary | ICD-10-CM | POA: Diagnosis not present

## 2018-12-17 DIAGNOSIS — A419 Sepsis, unspecified organism: Principal | ICD-10-CM | POA: Diagnosis present

## 2018-12-17 DIAGNOSIS — R6520 Severe sepsis without septic shock: Secondary | ICD-10-CM | POA: Diagnosis not present

## 2018-12-17 DIAGNOSIS — E876 Hypokalemia: Secondary | ICD-10-CM | POA: Diagnosis not present

## 2018-12-17 DIAGNOSIS — R918 Other nonspecific abnormal finding of lung field: Secondary | ICD-10-CM | POA: Diagnosis not present

## 2018-12-17 DIAGNOSIS — Z7902 Long term (current) use of antithrombotics/antiplatelets: Secondary | ICD-10-CM

## 2018-12-17 DIAGNOSIS — Z8349 Family history of other endocrine, nutritional and metabolic diseases: Secondary | ICD-10-CM

## 2018-12-17 DIAGNOSIS — J440 Chronic obstructive pulmonary disease with acute lower respiratory infection: Secondary | ICD-10-CM | POA: Diagnosis present

## 2018-12-17 DIAGNOSIS — G8929 Other chronic pain: Secondary | ICD-10-CM | POA: Diagnosis present

## 2018-12-17 DIAGNOSIS — M545 Low back pain: Secondary | ICD-10-CM | POA: Diagnosis not present

## 2018-12-17 DIAGNOSIS — J449 Chronic obstructive pulmonary disease, unspecified: Secondary | ICD-10-CM

## 2018-12-17 DIAGNOSIS — F329 Major depressive disorder, single episode, unspecified: Secondary | ICD-10-CM | POA: Diagnosis present

## 2018-12-17 DIAGNOSIS — J9811 Atelectasis: Secondary | ICD-10-CM | POA: Diagnosis present

## 2018-12-17 DIAGNOSIS — Z87891 Personal history of nicotine dependence: Secondary | ICD-10-CM

## 2018-12-17 DIAGNOSIS — R0600 Dyspnea, unspecified: Secondary | ICD-10-CM | POA: Diagnosis not present

## 2018-12-17 DIAGNOSIS — J984 Other disorders of lung: Secondary | ICD-10-CM | POA: Diagnosis not present

## 2018-12-17 DIAGNOSIS — J9621 Acute and chronic respiratory failure with hypoxia: Secondary | ICD-10-CM | POA: Diagnosis not present

## 2018-12-17 DIAGNOSIS — F419 Anxiety disorder, unspecified: Secondary | ICD-10-CM | POA: Diagnosis present

## 2018-12-17 DIAGNOSIS — R450 Nervousness: Secondary | ICD-10-CM | POA: Diagnosis not present

## 2018-12-17 DIAGNOSIS — I739 Peripheral vascular disease, unspecified: Secondary | ICD-10-CM

## 2018-12-17 DIAGNOSIS — Z8249 Family history of ischemic heart disease and other diseases of the circulatory system: Secondary | ICD-10-CM

## 2018-12-17 DIAGNOSIS — E785 Hyperlipidemia, unspecified: Secondary | ICD-10-CM | POA: Diagnosis present

## 2018-12-17 DIAGNOSIS — Z66 Do not resuscitate: Secondary | ICD-10-CM | POA: Diagnosis not present

## 2018-12-17 DIAGNOSIS — Z95820 Peripheral vascular angioplasty status with implants and grafts: Secondary | ICD-10-CM | POA: Diagnosis not present

## 2018-12-17 DIAGNOSIS — Z833 Family history of diabetes mellitus: Secondary | ICD-10-CM

## 2018-12-17 DIAGNOSIS — J441 Chronic obstructive pulmonary disease with (acute) exacerbation: Secondary | ICD-10-CM | POA: Diagnosis not present

## 2018-12-17 DIAGNOSIS — J690 Pneumonitis due to inhalation of food and vomit: Secondary | ICD-10-CM | POA: Diagnosis present

## 2018-12-17 DIAGNOSIS — R1310 Dysphagia, unspecified: Secondary | ICD-10-CM | POA: Diagnosis present

## 2018-12-17 DIAGNOSIS — J181 Lobar pneumonia, unspecified organism: Secondary | ICD-10-CM | POA: Diagnosis not present

## 2018-12-17 DIAGNOSIS — J9601 Acute respiratory failure with hypoxia: Secondary | ICD-10-CM | POA: Diagnosis not present

## 2018-12-17 DIAGNOSIS — R739 Hyperglycemia, unspecified: Secondary | ICD-10-CM | POA: Diagnosis present

## 2018-12-17 DIAGNOSIS — Z800 Family history of malignant neoplasm of digestive organs: Secondary | ICD-10-CM

## 2018-12-17 DIAGNOSIS — I10 Essential (primary) hypertension: Secondary | ICD-10-CM

## 2018-12-17 DIAGNOSIS — R000 Tachycardia, unspecified: Secondary | ICD-10-CM | POA: Diagnosis not present

## 2018-12-17 DIAGNOSIS — J439 Emphysema, unspecified: Secondary | ICD-10-CM | POA: Diagnosis not present

## 2018-12-17 DIAGNOSIS — R0689 Other abnormalities of breathing: Secondary | ICD-10-CM | POA: Diagnosis not present

## 2018-12-17 LAB — CBC WITH DIFFERENTIAL/PLATELET
Abs Immature Granulocytes: 0.11 10*3/uL — ABNORMAL HIGH (ref 0.00–0.07)
Basophils Absolute: 0.1 10*3/uL (ref 0.0–0.1)
Basophils Relative: 0 %
Eosinophils Absolute: 0 10*3/uL (ref 0.0–0.5)
Eosinophils Relative: 0 %
HCT: 46.6 % (ref 39.0–52.0)
Hemoglobin: 15.4 g/dL (ref 13.0–17.0)
Immature Granulocytes: 1 %
Lymphocytes Relative: 4 %
Lymphs Abs: 0.7 10*3/uL (ref 0.7–4.0)
MCH: 30.1 pg (ref 26.0–34.0)
MCHC: 33 g/dL (ref 30.0–36.0)
MCV: 91 fL (ref 80.0–100.0)
Monocytes Absolute: 1 10*3/uL (ref 0.1–1.0)
Monocytes Relative: 5 %
Neutro Abs: 17.6 10*3/uL — ABNORMAL HIGH (ref 1.7–7.7)
Neutrophils Relative %: 90 %
Platelets: 302 10*3/uL (ref 150–400)
RBC: 5.12 MIL/uL (ref 4.22–5.81)
RDW: 14 % (ref 11.5–15.5)
WBC: 19.5 10*3/uL — ABNORMAL HIGH (ref 4.0–10.5)
nRBC: 0 % (ref 0.0–0.2)

## 2018-12-17 LAB — LACTIC ACID, PLASMA
Lactic Acid, Venous: 6.5 mmol/L (ref 0.5–1.9)
Lactic Acid, Venous: 5.4 mmol/L (ref 0.5–1.9)
Lactic Acid, Venous: 3.5 mmol/L (ref 0.5–1.9)
Lactic Acid, Venous: 2.2 mmol/L (ref 0.5–1.9)

## 2018-12-17 LAB — COMPREHENSIVE METABOLIC PANEL
ALT: 26 U/L (ref 0–44)
AST: 37 U/L (ref 15–41)
Albumin: 3.9 g/dL (ref 3.5–5.0)
Alkaline Phosphatase: 77 U/L (ref 38–126)
Anion gap: 17 — ABNORMAL HIGH (ref 5–15)
BUN: 11 mg/dL (ref 8–23)
CO2: 18 mmol/L — ABNORMAL LOW (ref 22–32)
Calcium: 9.2 mg/dL (ref 8.9–10.3)
Chloride: 103 mmol/L (ref 98–111)
Creatinine, Ser: 1.16 mg/dL (ref 0.61–1.24)
GFR calc Af Amer: >60 mL/min (ref >60)
GFR calc non Af Amer: >60 mL/min (ref >60)
Glucose, Bld: 181 mg/dL — ABNORMAL HIGH (ref 70–99)
Potassium: 3.7 mmol/L (ref 3.5–5.1)
Sodium: 138 mmol/L (ref 135–145)
Total Bilirubin: 0.7 mg/dL (ref 0.3–1.2)
Total Protein: 8.1 g/dL (ref 6.5–8.1)

## 2018-12-17 LAB — URINALYSIS, ROUTINE W REFLEX MICROSCOPIC
Bilirubin Urine: NEGATIVE
Glucose, UA: NEGATIVE mg/dL
Hgb urine dipstick: NEGATIVE
Ketones, ur: 5 mg/dL — AB
Leukocytes,Ua: NEGATIVE
Nitrite: NEGATIVE
Protein, ur: NEGATIVE mg/dL
Specific Gravity, Urine: 1.017 (ref 1.005–1.030)
pH: 6 (ref 5.0–8.0)

## 2018-12-17 LAB — PROCALCITONIN: Procalcitonin: <0.1 ng/mL

## 2018-12-17 LAB — BRAIN NATRIURETIC PEPTIDE: B Natriuretic Peptide: 102.2 pg/mL — ABNORMAL HIGH (ref 0.0–100.0)

## 2018-12-17 LAB — D-DIMER, QUANTITATIVE: D-Dimer, Quant: 2.82 ug/mL-FEU — ABNORMAL HIGH (ref 0.00–0.50)

## 2018-12-17 LAB — C-REACTIVE PROTEIN: CRP: 0.9 mg/dL (ref <1.0)

## 2018-12-17 LAB — TROPONIN I
Troponin I: 0.03 ng/mL (ref <0.03)
Troponin I: 0.04 ng/mL (ref <0.03)

## 2018-12-17 LAB — HEMOGLOBIN A1C
Hgb A1c MFr Bld: 5.7 % — ABNORMAL HIGH (ref 4.8–5.6)
Mean Plasma Glucose: 116.89 mg/dL

## 2018-12-17 LAB — FIBRINOGEN: Fibrinogen: 493 mg/dL — ABNORMAL HIGH (ref 210–475)

## 2018-12-17 LAB — LACTATE DEHYDROGENASE: LDH: 170 U/L (ref 98–192)

## 2018-12-17 LAB — FERRITIN: Ferritin: 188 ng/mL (ref 24–336)

## 2018-12-17 LAB — TRIGLYCERIDES: Triglycerides: 70 mg/dL (ref <150)

## 2018-12-17 LAB — SARS CORONAVIRUS 2 BY RT PCR (HOSPITAL ORDER, PERFORMED IN ~~LOC~~ HOSPITAL LAB): SARS Coronavirus 2: NEGATIVE

## 2018-12-17 MED ORDER — SODIUM CHLORIDE 0.9 % IV SOLN
500.0000 mg | INTRAVENOUS | Status: DC
  Administered 2018-12-17 – 2018-12-19 (×3): 500 mg via INTRAVENOUS
  Filled 2018-12-17 (×4): qty 500

## 2018-12-17 MED ORDER — POLYETHYLENE GLYCOL 3350 17 G PO PACK: 17.0000 g | PACK | Freq: Every day | ORAL | Status: DC | PRN

## 2018-12-17 MED ORDER — SODIUM CHLORIDE 0.9% FLUSH
3.0000 mL | Freq: Two times a day (BID) | INTRAVENOUS | Status: DC
  Administered 2018-12-20 – 2018-12-24 (×6): 3 mL via INTRAVENOUS

## 2018-12-17 MED ORDER — IOHEXOL 350 MG/ML SOLN
80.0000 mL | Freq: Once | INTRAVENOUS | Status: AC | PRN
  Administered 2018-12-17: 17:00:00 80 mL via INTRAVENOUS

## 2018-12-17 MED ORDER — SODIUM CHLORIDE 0.9 % IV SOLN
2.0000 g | Freq: Once | INTRAVENOUS | Status: AC
  Administered 2018-12-17: 2 g via INTRAVENOUS
  Filled 2018-12-17: qty 20

## 2018-12-17 MED ORDER — ACETAMINOPHEN 325 MG PO TABS: 325.0000 mg | ORAL_TABLET | Freq: Once | ORAL | Status: DC

## 2018-12-17 MED ORDER — SODIUM CHLORIDE 0.9 % IV BOLUS (SEPSIS)
1178.0000 mL | Freq: Once | INTRAVENOUS | Status: AC
  Administered 2018-12-17: 16:00:00 1178 mL via INTRAVENOUS

## 2018-12-17 MED ORDER — SODIUM CHLORIDE 0.9 % IV BOLUS (SEPSIS)
1000.0000 mL | Freq: Once | INTRAVENOUS | Status: AC
  Administered 2018-12-17: 1000 mL via INTRAVENOUS

## 2018-12-17 MED ORDER — ONDANSETRON HCL 4 MG/2ML IJ SOLN
4.0000 mg | Freq: Four times a day (QID) | INTRAMUSCULAR | Status: DC | PRN
  Administered 2018-12-20 (×2): 4 mg via INTRAVENOUS
  Filled 2018-12-17 (×2): qty 2

## 2018-12-17 MED ORDER — ACETAMINOPHEN 325 MG PO TABS
650.0000 mg | ORAL_TABLET | Freq: Four times a day (QID) | ORAL | Status: DC | PRN
  Administered 2018-12-18: 650 mg via ORAL
  Filled 2018-12-17: qty 2

## 2018-12-17 MED ORDER — ONDANSETRON HCL 4 MG PO TABS: 4.0000 mg | ORAL_TABLET | Freq: Four times a day (QID) | ORAL | Status: DC | PRN

## 2018-12-17 MED ORDER — SERTRALINE HCL 25 MG PO TABS
25.0000 mg | ORAL_TABLET | Freq: Every day | ORAL | Status: DC
  Administered 2018-12-18 – 2018-12-24 (×7): 25 mg via ORAL
  Filled 2018-12-17 (×7): qty 1

## 2018-12-17 MED ORDER — BUSPIRONE HCL 15 MG PO TABS
7.5000 mg | ORAL_TABLET | Freq: Two times a day (BID) | ORAL | Status: DC
  Administered 2018-12-17 – 2018-12-24 (×14): 7.5 mg via ORAL
  Filled 2018-12-17: qty 1
  Filled 2018-12-17: qty 2
  Filled 2018-12-17 (×2): qty 1
  Filled 2018-12-17 (×3): qty 2
  Filled 2018-12-17: qty 1
  Filled 2018-12-17: qty 2
  Filled 2018-12-17 (×3): qty 1
  Filled 2018-12-17: qty 2
  Filled 2018-12-17: qty 1
  Filled 2018-12-17: qty 2
  Filled 2018-12-17: qty 1
  Filled 2018-12-17: qty 2
  Filled 2018-12-17: qty 1
  Filled 2018-12-17: qty 2
  Filled 2018-12-17 (×2): qty 1
  Filled 2018-12-17 (×2): qty 2
  Filled 2018-12-17 (×2): qty 1
  Filled 2018-12-17 (×2): qty 2
  Filled 2018-12-17: qty 1

## 2018-12-17 MED ORDER — OXYCODONE HCL 5 MG PO TABS
15.0000 mg | ORAL_TABLET | ORAL | Status: DC
  Administered 2018-12-17 – 2018-12-18 (×4): 15 mg via ORAL
  Administered 2018-12-18: 5 mg via ORAL
  Administered 2018-12-18: 10 mg via ORAL
  Administered 2018-12-18 – 2018-12-21 (×17): 15 mg via ORAL
  Filled 2018-12-17 (×23): qty 3

## 2018-12-17 MED ORDER — SODIUM CHLORIDE 0.9 % IV SOLN
2.0000 g | INTRAVENOUS | Status: DC
  Filled 2018-12-17: qty 20

## 2018-12-17 MED ORDER — ENOXAPARIN SODIUM 40 MG/0.4ML ~~LOC~~ SOLN
40.0000 mg | SUBCUTANEOUS | Status: DC
  Administered 2018-12-17 – 2018-12-23 (×7): 40 mg via SUBCUTANEOUS
  Filled 2018-12-17 (×7): qty 0.4

## 2018-12-17 MED ORDER — SODIUM CHLORIDE 0.9 % IV SOLN
INTRAVENOUS | Status: DC
  Administered 2018-12-17 – 2018-12-19 (×6): via INTRAVENOUS

## 2018-12-17 MED ORDER — CLOPIDOGREL BISULFATE 75 MG PO TABS
75.0000 mg | ORAL_TABLET | Freq: Every day | ORAL | Status: DC
  Administered 2018-12-17 – 2018-12-24 (×8): 75 mg via ORAL
  Filled 2018-12-17 (×8): qty 1

## 2018-12-17 MED ORDER — ALBUTEROL SULFATE (2.5 MG/3ML) 0.083% IN NEBU: 2.5000 mg | INHALATION_SOLUTION | RESPIRATORY_TRACT | Status: DC | PRN

## 2018-12-17 MED ORDER — DOCUSATE SODIUM 100 MG PO CAPS
100.0000 mg | ORAL_CAPSULE | Freq: Two times a day (BID) | ORAL | Status: DC
  Administered 2018-12-20 – 2018-12-21 (×3): 100 mg via ORAL
  Filled 2018-12-17 (×8): qty 1

## 2018-12-17 MED ORDER — ACETAMINOPHEN 500 MG PO TABS
1000.0000 mg | ORAL_TABLET | Freq: Once | ORAL | Status: AC
  Administered 2018-12-17: 1000 mg via ORAL
  Filled 2018-12-17: qty 2

## 2018-12-17 MED ORDER — ACETAMINOPHEN 650 MG RE SUPP: 650.0000 mg | Freq: Four times a day (QID) | RECTAL | Status: DC | PRN

## 2018-12-17 NOTE — Progress Notes (Signed)
Patient arrived to room 5W05. Noted to be SOB.

## 2018-12-17 NOTE — Progress Notes (Signed)
Notified NP Schorr via page of of lactic 2.2 at 0204.   Notified by tele HR up to 141 (non-sustained) at 0442.

## 2018-12-17 NOTE — ED Notes (Signed)
Patient transported to CT 

## 2018-12-17 NOTE — H&P (Signed)
History and Physical    Christopher Pena VWU:[id number] DOB: Aug 29, 1945 DOA: 12/17/2018  PCP: Sheliah Hatch, MD Consultants:  Sherene Sires - pulmonology Patient coming from:  Home  Chief Complaint: Fever, SOB  HPI: Christopher Pena is a 73 y.o. male with medical history significant of  PAD s/p stent; HTN; COPD NOT on home O2; and chronic back pain presenting with fever, SOB.  Patient reports feeling well yesterday.  He ate chicken alfredo last night about 1130PM and had a coughing episode while eating; he felt like there was a tickle in his throat and thought he may have aspirated.  He developed persistent SOB and then fever.  He denies usual problems with eating or coughing/choking/sputtering.   ED Course:   COVID negative.  Started last night after eating a late-night snack and coughing.  Treating for aspiration PNA.  Lactate 6.5.  Received 30 cc/kg bolus and Rocephin.  Treating for sepsis.  Unlikely PE, but will order CTA.  Review of Systems: As per HPI; otherwise review of systems reviewed and negative.    Past Medical History:  Diagnosis Date  . Anxiety   . Chronic low back pain    "degenerative spine dx'd ~ 7 yr ago" (06/08/2013)  . Claudication (HCC)   . Constipation due to pain medication   . COPD (chronic obstructive pulmonary disease) (HCC)   . Hypertension    "moderately high; RX didn't help" (06/08/2013) - no longer on meds (as of 09/19/16)  . Neuromuscular disorder (HCC)    degen spine  . Non-healing wound of lower extremity 06/09/2013  . PAD (peripheral artery disease), PTA/stent IDEV & chocolate baloon 06/08/13 04/13/2013   Severely reduced ABI's of 0.3 bilaterally   . Tobacco use 06/09/2013  . Unexplained weight loss     Past Surgical History:  Procedure Laterality Date  . ABDOMINAL AORTAGRAM  05/10/2013   Procedure: ABDOMINAL Ronny Flurry;  Surgeon: Runell Gess, MD;  Location[facility] LAB;  Service: Cardiovascular;;  . ABI     04/09/13; abnormal  . ANGIOPLASTY Left  05/21/2016   Procedure: ballon ANGIOPLASTY of femoral to anterior to tibial bypass graft;  Surgeon: Nada Libman, MD;  Location: [facility] OR;  Service: Vascular;  Laterality: Left;  . APPENDECTOMY  05/2001  . FEMORAL ARTERY STENT Right 05/10/13   2 stents Rt SFA  . FEMORAL ARTERY STENT Left 06/08/2013  . FEMORAL-POPLITEAL BYPASS GRAFT Right 11/12/2013   Procedure: RIGHT  FEMORAL-BELOW KNEE POPLITEAL ARTERY BYPASS GRAFT USING NON- REVERSE RIGHT GREATER SAPHENOUS VEIN.;  Surgeon: Nada Libman, MD;  Location: [facility] OR;  Service: Vascular;  Laterality: Right;  . FEMORAL-TIBIAL BYPASS GRAFT Left 12/08/2015   Procedure:  LEFT FEMORAL-ANTERIOR TIBIAL ARTERY BYPASS GRAFT;  Surgeon: Nada Libman, MD;  Location: [facility] OR;  Service: Vascular;  Laterality: Left;  . FEMORAL-TIBIAL BYPASS GRAFT Left 09/20/2016   Procedure: REDO BYPASS GRAFT FEMORAL-ANTERIOR TIBIAL ARTERY;  Surgeon: Nada Libman, MD;  Location: [facility] OR;  Service: Vascular;  Laterality: Left;  . LOWER EXTREMITY ANGIOGRAM Bilateral 05/10/2013   Procedure: LOWER EXTREMITY ANGIOGRAM;  Surgeon: Runell Gess, MD;  Location: [facility] CATH LAB;  Service: Cardiovascular;  Laterality: Bilateral;  . LOWER EXTREMITY ANGIOGRAM N/A 06/08/2013   Procedure: LOWER EXTREMITY ANGIOGRAM;  Surgeon: Runell Gess, MD;  Location: [facility] CATH LAB;  Service: Cardiovascular;  Laterality: N/A;  . LOWER EXTREMITY ANGIOGRAM N/A 10/28/2013   Procedure: LOWER EXTREMITY ANGIOGRAM;  Surgeon: Runell Gess, MD;  Location: [facility] CATH LAB;  Service: Cardiovascular;  Laterality: N/A;  . LOWER EXTREMITY ANGIOGRAM N/A 05/16/2014   Procedure: LOWER EXTREMITY ANGIOGRAM;  Surgeon: Runell GessJonathan J Berry, MD;  [facility];  Service: Cardiovascular;  Laterality: N/A;  . PERCUTANEOUS STENT INTERVENTION Right 05/10/2013   Procedure: PERCUTANEOUS STENT INTERVENTION;  Surgeon: Runell GessJonathan J Berry, MD;  [facility];  Service: Cardiovascular;  Laterality: Right;  rt sfa and popliteal stent x2  .  PERIPHERAL VASCULAR CATHETERIZATION N/A 12/06/2015   Procedure: Abdominal Aortogram w/Lower Extremity;  Surgeon: Nada LibmanVance W Brabham, MD;  Location: [facility] INVASIVE CV LAB;  Service: Cardiovascular;  Laterality: N/A;  . PERIPHERAL VASCULAR CATHETERIZATION N/A 05/22/2016   Procedure: Abdominal Aortogram w/Lower Extremity;  Surgeon: Maeola HarmanBrandon Christopher Cain, MD;  [facility];  Service: Cardiovascular;  Laterality: N/A;  . PERIPHERAL VASCULAR CATHETERIZATION N/A 09/17/2016   Procedure: Abdominal Aortogram w/Lower Extremity;  Surgeon: Nada LibmanVance W Brabham, MD;  Location: [facility] INVASIVE CV LAB;  Service: Cardiovascular;  Laterality: N/A;  . THROMBECTOMY FEMORAL ARTERY Left 05/21/2016   Procedure: THROMBECTOMY FEMORAL TO ANTERIOR TIBIAL BYPASS GRAFT;  Surgeon: Nada LibmanVance W Brabham, MD;  Location: [facility] OR;  Service: Vascular;  Laterality: Left;  Marland Kitchen. VEIN HARVEST Left 12/08/2015   Procedure: USING NON REVERSE  LEFT GREATER SAPHENOUS VEIN;  Surgeon: Nada LibmanVance W Brabham, MD;  Location: [facility] OR;  Service: Vascular;  Laterality: Left;    Social History   Socioeconomic History  . Marital status: Divorced    Spouse name: Not on file  . Number of children: Not on file  . Years of education: Not on file  . Highest education level: Not on file  Occupational History  . Occupation: former Journalist, [employer]  Social Needs  . Financial resource strain: Not on file  . Food insecurity:    Worry: Not on file    Inability: Not on file  . Transportation needs:    Medical: Not on file    Non-medical: Not on file  Tobacco Use  . Smoking status: Former Smoker    Packs/day: 0.25    Years: 50.00    Pack years: 12.50    Types: Cigarettes    Start date: 11/14/2015    Last attempt to quit: 11/13/2016    Years since quitting: 2.0  . Smokeless tobacco: Never Used  Substance and Sexual Activity  . Alcohol use: No    Alcohol/week: 0.0 standard drinks    Comment: 06/08/2013 "quit 02/2007; drank my share before that though"  . Drug use: No  .  Sexual activity: Yes  Lifestyle  . Physical activity:    Days per week: Not on file    Minutes per session: Not on file  . Stress: Not on file  Relationships  . Social connections:    Talks on phone: Not on file    Gets together: Not on file    Attends religious service: Not on file    Active member of club or organization: Not on file    Attends meetings of clubs or organizations: Not on file    Relationship status: Not on file  . Intimate partner violence:    Fear of current or ex partner: Not on file    Emotionally abused: Not on file    Physically abused: Not on file    Forced sexual activity: Not on file  Other Topics Concern  . Not on file  Social History Narrative  . Not on file    Allergies  Allergen Reactions  . Clindamycin/Lincomycin Diarrhea  Family History  Problem Relation Age of Onset  . Hyperlipidemia Mother   . Heart disease Mother   . Diabetes Mother   . Cancer Father        Liver    Prior to Admission medications   Medication Sig Start Date End Date Taking? Authorizing Provider  atorvastatin (LIPITOR) 10 MG tablet Take 1 tablet (10 mg total) by mouth daily. 09/23/16   Maris Berger A, PA-C  budesonide (PULMICORT) 0.25 MG/2ML nebulizer solution Take 2 mLs (0.25 mg total) by nebulization every 12 (twelve) hours as needed (shortness or breath). Use one vial twice daily 02/03/17   Nyoka Cowden, MD  busPIRone (BUSPAR) 7.5 MG tablet TAKE 1 TABLET BY MOUTH TWICE A DAY 11/10/18   Sheliah Hatch, MD  cilostazol (PLETAL) 100 MG tablet Take 1 tablet (100 mg total) by mouth 2 (two) times daily before a meal. 04/01/18   Nickel, Carma Lair, NP  clopidogrel (PLAVIX) 75 MG tablet TAKE 1 TABLET BY MOUTH EVERY DAY 04/23/18   Nickel, Carma Lair, NP  formoterol (PERFOROMIST) 20 MCG/2ML nebulizer solution Take 2 mLs (20 mcg total) by nebulization 2 (two) times daily. 02/03/17   Nyoka Cowden, MD  PROAIR HFA 108 [phone] Base) MCG/ACT inhaler  11/21/16   [provider]  sertraline (ZOLOFT) 25 MG tablet TAKE 1 TABLET BY MOUTH EVERY DAY 10/15/18   Sheliah Hatch, MD    Physical Exam: Vitals:   12/17/18 1700 12/17/18 1708 12/17/18 1745 12/17/18 1800  BP: 134/71  135/78 125/68  Pulse: 93  (!) 103 98  Resp: 18  (!) 25 (!) 22  Temp:  98.4 F (36.9 C)    TempSrc:  Oral    SpO2: 96%  95% 96%  Weight:      Height:         . General:  Appears calm and comfortable and is NAD; he has very mildly increased WOB while at rest but does have conversational dyspnea . Eyes:  PERRL, EOMI, normal lids, iris . ENT:  Hard of hearing, normal lips & tongue, mmm . Neck:  no LAD, masses or thyromegaly . Cardiovascular:  RR with tachycardia, no m/r/g. No LE edema.  Marland Kitchen Respiratory:   CTA bilaterally with no wheezes/rales/rhonchi.  Normal respiratory effort at rest and increased WOB with conversation. . Abdomen:  soft, NT, ND, NABS . Skin:  no rash or induration seen on limited exam . Musculoskeletal:  grossly normal tone BUE/BLE, good ROM, no bony abnormality . Psychiatric:  grossly normal mood and affect, speech fluent and appropriate, AOx3 . Neurologic:  CN 2-12 grossly intact, moves all extremities in coordinated fashion, sensation intact    Radiological Exams on Admission: Ct Angio Chest Pe W And/or Wo Contrast  Result Date: 12/17/2018 CLINICAL DATA:  73 year old male with shortness of breath EXAM: CT ANGIOGRAPHY CHEST WITH CONTRAST TECHNIQUE: Multidetector CT imaging of the chest was performed using the standard protocol during bolus administration of intravenous contrast. Multiplanar CT image reconstructions and MIPs were obtained to evaluate the vascular anatomy. CONTRAST:  80mL OMNIPAQUE IOHEXOL 350 MG/ML SOLN COMPARISON:  Chest x-ray 01/02/2016, 09/21/2016, 12/17/2018, 05/04/2013 FINDINGS: Cardiovascular: Heart: No cardiomegaly. No pericardial fluid/thickening. Minimal calcified atherosclerosis of the circumflex coronary artery. Aorta: Unremarkable course,  caliber, contour of the thoracic aorta. No aneurysm or dissection flap. No periaortic fluid. Mild atherosclerosis. Branch vessels are patent. Pulmonary arteries: No central, lobar, segmental, or proximal subsegmental filling defects. Mediastinum/Nodes: No mediastinal adenopathy. Unremarkable appearance of the thoracic  esophagus. Unremarkable thoracic inlet Lungs/Pleura: Pleuroparenchymal scarring at the apices. Centrilobular and paraseptal emphysema. There is apparent occlusion of the distal right mainstem bronchus and bronchus intermedius with endobronchial debris. Debris extends into the bronchi of the middle lobe and into the right lower lobe. Bronchial wall thickening present of the middle lobe and right lower lobe branches. Associated tree-in-bud pattern of opacity of the right lower lobe with no pleural effusion. There is 10 mm nodule at the periphery of the right lower lobe on image 66 of series 7. No pneumothorax Upper Abdomen: No acute. Musculoskeletal: No acute displaced fracture. Degenerative changes of the spine. Review of the MIP images confirms the above findings. IMPRESSION: CT negative for pulmonary emboli. Bronchitis/bronchiolitis of the right lower lobe and to a lesser degree the right middle lobe with endobronchial debris, bronchial wall thickening, and tree-in-bud opacity of the right lower lobe. Given the degree of endobronchial debris and nodular changes, short-term follow-up with contrast-enhanced CT is recommended in 4 weeks-6 weeks once the patient has been treated to assure resolution and the absence of an endobronchial lesion. Alternatively, referral for pulmonary evaluation and bronchoscopy may be considered. 1 cm nodule at the periphery of the right lower lobe. This appears to have been present dating back to chest x-ray 01/02/2016, most likely benign hamartoma or granuloma. Aortic Atherosclerosis (ICD10-I70.0). Associated coronary artery disease Electronically Signed   By: Gilmer Mor  D.O.   On: 12/17/2018 17:54   Dg Chest Port 1 View  Result Date: 12/17/2018 CLINICAL DATA:  73 year old male with shortness of breath EXAM: PORTABLE CHEST 1 VIEW COMPARISON:  Prior chest x-ray 09/21/2016 and 01/02/2016 FINDINGS: Cardiac and mediastinal contours remain within normal limits. Atherosclerotic calcifications again noted in the transverse aorta. The lungs remain slightly hyperinflated with chronic background bronchitic changes. Mild biapical pleuroparenchymal scarring. No evidence of pulmonary edema, pleural effusion, pneumothorax or focal airspace consolidation. Calcified granuloma overlying the periphery of the right lower lobe appears similar dating back to 2017. IMPRESSION: Stable chest x-ray without evidence of acute cardiopulmonary process. Background pulmonary parenchymal changes consistent with COPD. Aortic Atherosclerosis (ICD10-170.0) Electronically Signed   By: Malachy Moan M.D.   On: 12/17/2018 14:36    EKG: Independently reviewed.  Sinus tachycardia with rate 123; nonspecific ST changes with no evidence of acute ischemia   Labs on Admission: I have personally reviewed the available labs and imaging studies at the time of the admission.  Pertinent labs:   CO2 18 Glucose 181 Anion gap 17 BNP 102.2 Troponin 0.03 LDH 170 Ferritiin 188 CRP 0.9 Lactate 6.5 WBC 19.5 D-dimer 2.82 Fibrinogen 493   Assessment/Plan Principal Problem:   Sepsis (HCC) Active Problems:   COPD II/III    HTN (hypertension)   Peripheral vascular disease, unspecified (HCC)   Acute on chronic respiratory failure with hypoxia (HCC)   Sepsis, likely related to aspiration PNA -SIRS criteria in this patient includes: Marked leukocytosis, fever, tachycardia, tachypnea, and hypoxia  -Patient has evidence of acute organ failure with markedly elevated lactate -While awaiting blood cultures, this appears to be a preseptic condition. -Sepsis protocol initiated -Patient had initial lactate >4  and so has received the 30 cc/kg IVF bolus; repeat lactate was improved but still >4 and so he was given an additional 1L NS bolus. -Suspected source is aspiration PNA - based on history as well as CT with tree-in-bud sign (nonspecific and may be associated with malignancy but can also be associated with acute aspiration) -Blood cultures pending -Will admit due to:  severe sepsis; dehydration that is severe; and hypoxic respiratory failure -Treat with IV Rocephin/Azithroymcin for suspected aspiration PNA in patient with underlying COPD -If patient is not improving, consider pulmonary consultation for possible bronchoscopy -He is recommended to have f/u CT in 4-6 weeks -Will continue to trend lactate to ensure clearance -He was COVID tested and this was negative; low suspicion for COVID-19 infection at this time  COPD +/- exacerbation -patient with chronic underlying COPD -He is not on home O2 but reports exertional SOB at baseline and thinks he needs home O2 -It is possible that his current symptoms are related to COPD exacerbation at this time -He has been given Azithromycin -He is not actively wheezing and steroids do not appear to be necessary at this time -Will add Albuterol HFA, as he does not appear to be using breathing treatments at home at this time  PVD -Patient continues to be on Plavix -He stopped taking Lipitor  HTN -He is not taking medications for this issue at this time  Hyperglycemia -No recent A1c, will check -For now, will follow without intervention  Chronic pain -Continue home Oxy IR  Depression/anxiety -Continue Zoloft, BuSpar   DVT prophylaxis:  Lovenox Code Status:  DNR - confirmed with patient Family Communication: None present Disposition Plan:  Home once clinically improved Consults called: None  Admission status: Admit - It is my clinical opinion that admission to INPATIENT is reasonable and necessary because of the expectation that this patient  will require hospital care that crosses at least 2 midnights to treat this condition based on the medical complexity of the problems presented.  Given the aforementioned information, the predictability of an adverse outcome is felt to be significant.     Jonah Blue MD [facility]   How to contact the [facility] Attending or Consulting provider 7A - 7P or covering provider during after hours 7P -7A, for this patient?  1. Check the care team in [facility] and look for a) attending/consulting [facility] provider listed and b) the [facility] team listed 2. Log into [email or web address] and use [facility] universal password to access. If you do not have the password, please contact the hospital operator. 3. Locate the [facility] provider you are looking for under [facility] and page to a number that you can be directly reached. 4. If you still have difficulty reaching the provider, please page the [facility] (Director on Call) for the Hospitalists listed on amion for assistance.   12/17/2018, 6:15 PM

## 2018-12-17 NOTE — ED Provider Notes (Addendum)
[facility] Provider Note   CSN: [id number] Arrival date & time: 12/17/18  1318    History   Chief Complaint Chief Complaint  Patient presents with   Shortness of Breath    HPI Christopher Pena is a 73 y.o. male.     Patient is a 73 year old male with past medical history of COPD, hypertension, anxiety, smoker who presents the emergency department for fever and shortness of breath and cough.  Patient reports that this started last night around 1115pm.  He reports that it did start after he was eating a snack and became choked.  Reports that it got worse today.  He does not have a usual oxygen requirement with his COPD.  Denies any chest pain, nausea, vomiting, sick contacts, recent travel.     Past Medical History:  Diagnosis Date   Anxiety    Chronic low back pain    "degenerative spine dx'd ~ 7 yr ago" (06/08/2013)   Claudication [facility](HCC)    Constipation due to pain medication    COPD (chronic obstructive pulmonary disease) (HCC)    Hypertension    "moderately high; RX didn't help" (06/08/2013) - no longer on meds (as of 09/19/16)   Neuromuscular disorder (HCC)    degen spine   Non-healing wound of lower extremity 06/09/2013   PAD (peripheral artery disease), PTA/stent IDEV & chocolate baloon 06/08/13 04/13/2013   Severely reduced ABI's of 0.3 bilaterally    Tobacco use 06/09/2013   Unexplained weight loss     Patient Active Problem List   Diagnosis Date Noted   Sepsis (HCC) 12/17/2018   Acute on chronic respiratory failure with hypoxia (HCC) 12/17/2018   Hyperlipidemia 02/12/2017   Physical exam 08/14/2016   Femoral-popliteal bypass graft occlusion, left (HCC) 05/21/2016   Allergic rhinitis 05/13/2016   Protein-calorie malnutrition, severe (HCC) 04/08/2016   Pain in joint, lower leg 10/17/2014   Visit for wound check 02/21/2014   Encounter for post surgical wound check 12/09/2013   Wound drainage-Right  medial leg 12/09/2013   Peripheral vascular disease, unspecified (HCC) 11/08/2013   Leg edema, left, foot 06/22/2013   Cellulitis of foot, left 06/09/2013   Non-healing wound of lower extremity, lt great toe 06/09/2013   Cigarette smoker 06/09/2013   Claudication, lifestyle limiting 05/11/2013   Unexplained weight loss 04/28/2013   PAD (peripheral artery disease), PTA/stent IDEV & chocolate baloon 06/08/13, Previous PTA/Stent to Rt SFA 05/10/13 04/13/2013   COPD II/III  04/13/2013   HTN (hypertension) 04/13/2013   Anxiety 04/13/2013   Low back pain 04/13/2013    Past Surgical History:  Procedure Laterality Date   ABDOMINAL AORTAGRAM  05/10/2013   Procedure: ABDOMINAL Ronny FlurryAORTAGRAM;  Surgeon: Runell GessJonathan J Berry, MD;  [facility];  Service: Cardiovascular;;   ABI     04/09/13; abnormal   ANGIOPLASTY Left 05/21/2016   Procedure: ballon ANGIOPLASTY of femoral to anterior to tibial bypass graft;  Surgeon: Nada LibmanVance W Brabham, MD;  [facility];  Service: Vascular;  Laterality: Left;   APPENDECTOMY  05/2001   FEMORAL ARTERY STENT Right 05/10/13   2 stents Rt SFA   FEMORAL ARTERY STENT Left 06/08/2013   FEMORAL-POPLITEAL BYPASS GRAFT Right 11/12/2013   Procedure: RIGHT  FEMORAL-BELOW KNEE POPLITEAL ARTERY BYPASS GRAFT USING NON- REVERSE RIGHT GREATER SAPHENOUS VEIN.;  Surgeon: Nada LibmanVance W Brabham, MD;  [facility] OR;  Service: Vascular;  Laterality: Right;   FEMORAL-TIBIAL BYPASS GRAFT Left 12/08/2015   Procedure:  LEFT FEMORAL-ANTERIOR TIBIAL ARTERY  BYPASS GRAFT;  Surgeon: Nada Libman, MD;  Location: [facility] OR;  Service: Vascular;  Laterality: Left;   FEMORAL-TIBIAL BYPASS GRAFT Left 09/20/2016   Procedure: REDO BYPASS GRAFT FEMORAL-ANTERIOR TIBIAL ARTERY;  Surgeon: Nada Libman, MD;  [facility]: [facility] OR;  Service: Vascular;  Laterality: Left;   LOWER EXTREMITY ANGIOGRAM Bilateral 05/10/2013   Procedure: LOWER EXTREMITY ANGIOGRAM;  Surgeon: Runell Gess, MD;  Location: [facility]  CATH LAB;  Service: Cardiovascular;  Laterality: Bilateral;   LOWER EXTREMITY ANGIOGRAM N/A 06/08/2013   Procedure: LOWER EXTREMITY ANGIOGRAM;  Surgeon: Runell Gess, MD;  Location: [facility] CATH LAB;  Service: Cardiovascular;  Laterality: N/A;   LOWER EXTREMITY ANGIOGRAM N/A 10/28/2013   Procedure: LOWER EXTREMITY ANGIOGRAM;  Surgeon: Runell Gess, MD;  Location: [facility] CATH LAB;  Service: Cardiovascular;  Laterality: N/A;   LOWER EXTREMITY ANGIOGRAM N/A 05/16/2014   Procedure: LOWER EXTREMITY ANGIOGRAM;  Surgeon: Runell Gess, MD;  Location: [facility] CATH LAB;  Service: Cardiovascular;  Laterality: N/A;   PERCUTANEOUS STENT INTERVENTION Right 05/10/2013   Procedure: PERCUTANEOUS STENT INTERVENTION;  Surgeon: Runell Gess, MD;  Location: [facility] CATH LAB;  Service: Cardiovascular;  Laterality: Right;  rt sfa and popliteal stent x2   PERIPHERAL VASCULAR CATHETERIZATION N/A 12/06/2015   Procedure: Abdominal Aortogram w/Lower Extremity;  Surgeon: Nada Libman, MD;  [facility] INVASIVE CV LAB;  Service: Cardiovascular;  Laterality: N/A;   PERIPHERAL VASCULAR CATHETERIZATION N/A 05/22/2016   Procedure: Abdominal Aortogram w/Lower Extremity;  Surgeon: Maeola Harman, MD;  Location: [facility] INVASIVE CV LAB;  Service: Cardiovascular;  Laterality: N/A;   PERIPHERAL VASCULAR CATHETERIZATION N/A 09/17/2016   Procedure: Abdominal Aortogram w/Lower Extremity;  Surgeon: Nada Libman, MD;  [facility]: [facility] INVASIVE CV LAB;  Service: Cardiovascular;  Laterality: N/A;   THROMBECTOMY FEMORAL ARTERY Left 05/21/2016   Procedure: THROMBECTOMY FEMORAL TO ANTERIOR TIBIAL BYPASS GRAFT;  Surgeon: Nada Libman, MD;  Location[facility] OR;  Service: Vascular;  Laterality: Left;   VEIN HARVEST Left 12/08/2015   Procedure: USING NON REVERSE  LEFT GREATER SAPHENOUS VEIN;  Surgeon: Nada Libman, MD;  Location: [facility] OR;  Service: Vascular;  Laterality: Left;        Home Medications    Prior to Admission medications    Medication Sig Start Date End Date Taking? Authorizing Provider  busPIRone (BUSPAR) 7.5 MG tablet TAKE 1 TABLET BY MOUTH TWICE A DAY Patient taking differently: Take 7.5 mg by mouth 2 (two) times daily.  11/10/18  Yes Sheliah Hatch, MD  clopidogrel (PLAVIX) 75 MG tablet TAKE 1 TABLET BY MOUTH EVERY DAY Patient taking differently: Take 75 mg by mouth daily.  04/23/18  Yes Nickel, Carma Lair, NP  oxyCODONE (ROXICODONE) 15 MG immediate release tablet Take 15 mg by mouth every 4 (four) hours. 12/03/18  Yes [provider]  sertraline (ZOLOFT) 25 MG tablet TAKE 1 TABLET BY MOUTH EVERY DAY Patient taking differently: Take 25 mg by mouth daily.  10/15/18  Yes Sheliah Hatch, MD  amoxicillin-clavulanate (AUGMENTIN) 875-125 MG tablet Take 1 tablet by mouth 2 (two) times daily for 3 days. 12/24/18 12/27/18  Danford, Earl Lites, MD    Family History Family History  Problem Relation Age of Onset   Hyperlipidemia Mother    Heart disease Mother    Diabetes Mother    Cancer Father        Liver    Social History Social History   Tobacco Use   Smoking status: Former Smoker  Packs/day: 0.25    Years: 50.00    Pack years: 12.50    Types: Cigarettes    Start date: 11/14/2015    Last attempt to quit: 11/13/2016    Years since quitting: 2.1   Smokeless tobacco: Never Used  Substance Use Topics   Alcohol use: No    Alcohol/week: 0.0 standard drinks    Comment: 06/08/2013 "quit 02/2007; drank my share before that though"   Drug use: No     Allergies   Clindamycin/lincomycin   Review of Systems Review of Systems  Constitutional: Negative for chills and fever.  HENT: Negative for ear pain and sore throat.   Eyes: Negative for pain and visual disturbance.  Respiratory: Positive for cough and shortness of breath.   Cardiovascular: Negative for chest pain and palpitations.  Gastrointestinal: Negative for abdominal pain, nausea and vomiting.  Genitourinary: Negative for  dysuria and hematuria.  Musculoskeletal: Negative for arthralgias and back pain.  Skin: Negative for color change and rash.  Neurological: Negative for dizziness, seizures, syncope and headaches.  All other systems reviewed and are negative.    Physical Exam Updated Vital Signs BP (!) 148/84 (BP Location: Left Arm)    Pulse 65    Temp 97.6 F (36.4 C) (Oral)    Resp 16    Ht  (1.702 m)    Wt 72.1 kg    SpO2 92%    BMI 24.90 kg/m   Physical Exam Vitals signs and nursing note reviewed.  Constitutional:      General: He is not in acute distress.    Appearance: Normal appearance. He is not ill-appearing, toxic-appearing or diaphoretic.     Comments: Elderly, pleasant, alert and oriented.  HENT:     Head: Normocephalic.     Right Ear: Tympanic membrane normal.     Left Ear: Tympanic membrane normal.     Mouth/Throat:     Mouth: Mucous membranes are moist.  Eyes:     Conjunctiva/sclera: Conjunctivae normal.  Cardiovascular:     Rate and Rhythm: Regular rhythm. Tachycardia present.  Pulmonary:     Effort: Pulmonary effort is normal.     Comments: Bilateral decreased breath sounds.  No wheezing, rhonchi, or rales Abdominal:     General: Abdomen is flat. Bowel sounds are normal.     Tenderness: There is no abdominal tenderness. There is no guarding.  Musculoskeletal: Normal range of motion.  Skin:    General: Skin is dry.     Capillary Refill: Capillary refill takes less than 2 seconds.  Neurological:     General: No focal deficit present.     Mental Status: He is alert.  Psychiatric:        Mood and Affect: Mood normal.      ED Treatments / Results  Labs (all labs ordered are listed, but only abnormal results are displayed) Labs Reviewed  CBC WITH DIFFERENTIAL/PLATELET - Abnormal; Notable for the following components:      Result Value   WBC 19.5 (*)    Neutro Abs 17.6 (*)    Abs Immature Granulocytes 0.11 (*)    All other components within normal limits  BRAIN  NATRIURETIC PEPTIDE - Abnormal; Notable for the following components:   B Natriuretic Peptide 102.2 (*)    All other components within normal limits  LACTIC ACID, PLASMA - Abnormal; Notable for the following components:   Lactic Acid, Venous 6.5 (*)    All other components within normal limits  COMPREHENSIVE METABOLIC  PANEL - Abnormal; Notable for the following components:   CO2 18 (*)    Glucose, Bld 181 (*)    Anion gap 17 (*)    All other components within normal limits  D-DIMER, QUANTITATIVE (NOT AT [facility]) - Abnormal; Notable for the following components:   D-Dimer, Quant 2.82 (*)    All other components within normal limits  FIBRINOGEN - Abnormal; Notable for the following components:   Fibrinogen 493 (*)    All other components within normal limits  URINALYSIS, ROUTINE W REFLEX MICROSCOPIC - Abnormal; Notable for the following components:   Ketones, ur 5 (*)    All other components within normal limits  TROPONIN I - Abnormal; Notable for the following components:   Troponin I 0.03 (*)    All other components within normal limits  LACTIC ACID, PLASMA - Abnormal; Notable for the following components:   Lactic Acid, Venous 5.4 (*)    All other components within normal limits  COMPREHENSIVE METABOLIC PANEL - Abnormal; Notable for the following components:   BUN 7 (*)    Calcium 8.1 (*)    Total Protein 6.3 (*)    Albumin 3.1 (*)    All other components within normal limits  LACTIC ACID, PLASMA - Abnormal; Notable for the following components:   Lactic Acid, Venous 3.5 (*)    All other components within normal limits  LACTIC ACID, PLASMA - Abnormal; Notable for the following components:   Lactic Acid, Venous 2.2 (*)    All other components within normal limits  TROPONIN I - Abnormal; Notable for the following components:   Troponin I 0.04 (*)    All other components within normal limits  TROPONIN I - Abnormal; Notable for the following components:   Troponin I 0.05 (*)     All other components within normal limits  CBC WITH DIFFERENTIAL/PLATELET - Abnormal; Notable for the following components:   WBC 16.6 (*)    Neutro Abs 14.6 (*)    Abs Immature Granulocytes 0.08 (*)    All other components within normal limits  HEMOGLOBIN A1C - Abnormal; Notable for the following components:   Hgb A1c MFr Bld 5.7 (*)    All other components within normal limits  TROPONIN I - Abnormal; Notable for the following components:   Troponin I 0.04 (*)    All other components within normal limits  CBC - Abnormal; Notable for the following components:   WBC 21.3 (*)    RBC 3.81 (*)    Hemoglobin 11.7 (*)    HCT 35.2 (*)    All other components within normal limits  BASIC METABOLIC PANEL - Abnormal; Notable for the following components:   Potassium 3.3 (*)    Glucose, Bld 132 (*)    Calcium 8.1 (*)    All other components within normal limits  CBC WITH DIFFERENTIAL/PLATELET - Abnormal; Notable for the following components:   WBC 13.9 (*)    RBC 3.91 (*)    Hemoglobin 11.8 (*)    HCT 36.0 (*)    Neutro Abs 12.0 (*)    Lymphs Abs 0.6 (*)    Monocytes Absolute 1.1 (*)    Abs Immature Granulocytes 0.22 (*)    All other components within normal limits  BASIC METABOLIC PANEL - Abnormal; Notable for the following components:   Calcium 8.0 (*)    All other components within normal limits  CBC WITH DIFFERENTIAL/PLATELET - Abnormal; Notable for the following components:   WBC 11.3 (*)  RBC 3.96 (*)    Hemoglobin 11.9 (*)    HCT 35.6 (*)    Neutro Abs 8.7 (*)    Monocytes Absolute 1.3 (*)    Abs Immature Granulocytes 0.30 (*)    All other components within normal limits  BASIC METABOLIC PANEL - Abnormal; Notable for the following components:   Potassium 3.1 (*)    Calcium 8.0 (*)    All other components within normal limits  BASIC METABOLIC PANEL - Abnormal; Notable for the following components:   Potassium 3.2 (*)    Glucose, Bld 102 (*)    BUN 7 (*)    Calcium 8.0  (*)    All other components within normal limits  CBC - Abnormal; Notable for the following components:   WBC 11.8 (*)    RBC 4.15 (*)    Hemoglobin 12.5 (*)    HCT 36.8 (*)    All other components within normal limits  BASIC METABOLIC PANEL - Abnormal; Notable for the following components:   Glucose, Bld 106 (*)    Calcium 8.0 (*)    All other components within normal limits  CULTURE, BLOOD (ROUTINE X 2)  CULTURE, BLOOD (ROUTINE X 2)  SARS CORONAVIRUS 2 (HOSPITAL ORDER, PERFORMED IN [facility])  PROCALCITONIN  LACTATE DEHYDROGENASE  FERRITIN  TRIGLYCERIDES  C-REACTIVE PROTEIN  LACTIC ACID, PLASMA  MAGNESIUM    EKG EKG Interpretation  Date/Time:  Thursday December 17 2018 13:27:24 EDT Ventricular Rate:  123 PR Interval:    QRS Duration: 79 QT Interval:  296 QTC Calculation: 424 R Axis:   63 Text Interpretation:  Sinus tachycardia Consider right atrial enlargement Borderline T wave abnormalities Confirmed by Lorre Nick ([id number]) on 12/18/2018 6:11:35 PM   Radiology Dg Chest 2 View  Result Date: 12/23/2018 CLINICAL DATA:  Pneumonia EXAM: CHEST - 2 VIEW COMPARISON:  Four days ago FINDINGS: No improvement in airspace disease at the right base. Small pleural effusions. Hyperinflation and emphysema. Normal heart size IMPRESSION: 1. Unchanged pneumonia on the right.  Small pleural effusions. 2. COPD. Electronically Signed   By: Marnee Spring M.D.   On: 12/23/2018 08:31    Procedures Procedures (including critical care time)  Medications Ordered in ED Medications  acetaminophen (TYLENOL) tablet 1,000 mg (1,000 mg Oral Given 12/17/18 1402)  sodium chloride 0.9 % bolus 1,000 mL (0 mLs Intravenous Stopped 12/17/18 1505)  sodium chloride 0.9 % bolus 1,178 mL (0 mLs Intravenous Stopped 12/17/18 1649)  cefTRIAXone (ROCEPHIN) 2 g in sodium chloride 0.9 % 100 mL IVPB (0 g Intravenous Stopped 12/17/18 1619)  iohexol (OMNIPAQUE) 350 MG/ML injection 80 mL (80 mLs Intravenous  Contrast Given 12/17/18 1721)  sodium chloride 0.9 % bolus 1,000 mL (1,000 mLs Intravenous Transfusing/Transfer 12/17/18 1826)  sodium chloride 0.9 % bolus 500 mL (500 mLs Intravenous New Bag/Given 12/18/18 0219)  potassium chloride SA (K-DUR) CR tablet 40 mEq (40 mEq Oral Given 12/20/18 1400)  potassium chloride SA (K-DUR) CR tablet 40 mEq (40 mEq Oral Given 12/21/18 1025)  potassium chloride SA (K-DUR) CR tablet 40 mEq (40 mEq Oral Given 12/22/18 1329)  potassium chloride SA (K-DUR) CR tablet 40 mEq (40 mEq Oral Given 12/23/18 2159)     Initial Impression / Assessment and Plan / ED Course  I have reviewed the triage vital signs and the nursing notes.  Pertinent labs & imaging results that were available during my care of the patient were reviewed by me and considered in my medical decision making (see  chart for details).  Clinical Course as of Dec 24 2230  Thu Dec 17, 2018  [id number] 73 year old gentleman with COPD here for cough, fever, shortness of breath after chocking on some food last night.  Patient febrile to 100.5, tachycardic to 128, tachypneic and is 92% on room air with a normal blood pressure.  Septic work-up ensued.  Differential includes aspiration pneumonia versus coronavirus. Less likely COPD exacerbation or ACS.  Patient placed on 2 L of oxygen.   [KM]  1454 Patient noted to be septic with fever, tachycardia, tachypnea and lactic acid of 6.5. One bolus of fluids given as his blood pressure is normal and covid is still on differential. If covid negative or if blood pressure drops I will initiate the 10ml/kg sepsis fluid protocol. Vitals have already improved with fluids, tylenol and oxygen. Still tachycardic at 115 but BP normal and resp rate decreased from 25 to 19    [KM]  1532 COVID testing negative.  Initiated further fluid bolus and Rocephin to meet sepsis treatment criteria. Consult pending for hospitalist. Patient stable and responding well to fluids. HR is 109, bp 125/89 resp rate 17  and 98% on 2l    [KM]  1616 Patient to be admitted by hospitalist for sepsis likely due to aspiration pneumonia. Covid negative. Patient stable at this time. Awaiting CTA   [KM]    Clinical Course User Index [KM] Arlyn Dunning, PA-C         Clinical Impression: 1. Severe sepsis (HCC)   2. Dyspnea   3. Pneumonia       This note was prepared with assistance of Dragon voice recognition software. Occasional wrong-word or sound-a-like substitutions may have occurred due to the inherent limitations of voice recognition software.   Final Clinical Impressions(s) / ED Diagnoses   Final diagnoses:  Severe sepsis [facility])    ED Discharge Orders         Ordered    amoxicillin-clavulanate (AUGMENTIN) 875-125 MG tablet  2 times daily     12/24/18 1039    Increase activity slowly     12/24/18 1039    Diet - low sodium heart healthy     12/24/18 1039    Discharge instructions    Comments:  From Dr. Maryfrances Bunnell: You were admitted with pneumonia. Your pneumonia was treated with antibiotics.  You should complete the course with 2 more days of Augmentin. Take Augmentin (amoxicillin-clavulanate) 875-125 mg every 12 hours starting tonight  Use oxygen 2 liters at all times.  Obtain a pulse oximeter (available at the pharmacy over the counter) and make sure that your oxygen level stays above 88% most of the time.  Call your primary care doctor for a follow up appointment, have them check your lab work.   12/24/18 1039           Arlyn Dunning, PA-C 12/17/18 1556    [address] 12/24/18 2232    Azalia Bilis, MD 12/24/18 [phone]

## 2018-12-17 NOTE — ED Notes (Signed)
ED TO INPATIENT HANDOFF REPORT  ED Nurse Name and Phone #: Maralyn Sago [phone]  S Name/Age/Gender Christopher Pena 73 y.o. male Room/Bed: 022C/022C  Code Status   Code Status: DNR  Home/SNF/Other Home Patient oriented to: self, place, time and situation Is this baseline? Yes   Triage Complete: Triage complete  Chief Complaint SOB  Triage Note Pt from home, reports SOB and productive cough x 2 days. 100.3 fever. A&O x4.    Allergies Allergies  Allergen Reactions  . Clindamycin/Lincomycin Diarrhea    Level of Care/Admitting Diagnosis ED Disposition    ED Disposition Condition Comment   Admit  Hospital Area: [facility] [[id number]]  Level of Care: Telemetry Medical [104]  Covid Evaluation: N/A  Diagnosis: Sepsis [facility]) [[id number]]  Admitting Physician: Jonah Blue [[id number]]  Attending Physician: Jonah Blue [[id number]]  Estimated length of stay: 3 - 4 days  Certification:: I certify this patient will need inpatient services for at least 2 midnights  PT Class (Do Not Modify): Inpatient [101]  PT Acc Code (Do Not Modify): Private [1]       B Medical/Surgery History Past Medical History:  Diagnosis Date  . Anxiety   . Chronic low back pain    "degenerative spine dx'd ~ 7 yr ago" (06/08/2013)  . Claudication (HCC)   . Constipation due to pain medication   . COPD (chronic obstructive pulmonary disease) (HCC)   . Hypertension    "moderately high; RX didn't help" (06/08/2013) - no longer on meds (as of 09/19/16)  . Neuromuscular disorder (HCC)    degen spine  . Non-healing wound of lower extremity 06/09/2013  . PAD (peripheral artery disease), PTA/stent IDEV & chocolate baloon 06/08/13 04/13/2013   Severely reduced ABI's of 0.3 bilaterally   . Tobacco use 06/09/2013  . Unexplained weight loss    Past Surgical History:  Procedure Laterality Date  . ABDOMINAL AORTAGRAM  05/10/2013   Procedure: ABDOMINAL Ronny Flurry;  Surgeon: Runell Gess, MD;  Location: [facility]  CATH LAB;  Service: Cardiovascular;;  . ABI     04/09/13; abnormal  . ANGIOPLASTY Left 05/21/2016   Procedure: ballon ANGIOPLASTY of femoral to anterior to tibial bypass graft;  Surgeon: Nada Libman, MD;  Location: [facility] OR;  Service: Vascular;  Laterality: Left;  . APPENDECTOMY  05/2001  . FEMORAL ARTERY STENT Right 05/10/13   2 stents Rt SFA  . FEMORAL ARTERY STENT Left 06/08/2013  . FEMORAL-POPLITEAL BYPASS GRAFT Right 11/12/2013   Procedure: RIGHT  FEMORAL-BELOW KNEE POPLITEAL ARTERY BYPASS GRAFT USING NON- REVERSE RIGHT GREATER SAPHENOUS VEIN.;  Surgeon: Nada Libman, MD;  Location: [facility] OR;  Service: Vascular;  Laterality: Right;  . FEMORAL-TIBIAL BYPASS GRAFT Left 12/08/2015   Procedure:  LEFT FEMORAL-ANTERIOR TIBIAL ARTERY BYPASS GRAFT;  Surgeon: Nada Libman, MD;  Location[facility] OR;  Service: Vascular;  Laterality: Left;  . FEMORAL-TIBIAL BYPASS GRAFT Left 09/20/2016   Procedure: REDO BYPASS GRAFT FEMORAL-ANTERIOR TIBIAL ARTERY;  Surgeon: Nada Libman, MD;  Location: [facility] OR;  Service: Vascular;  Laterality: Left;  . LOWER EXTREMITY ANGIOGRAM Bilateral 05/10/2013   Procedure: LOWER EXTREMITY ANGIOGRAM;  Surgeon: Runell Gess, MD;  Location: [facility] CATH LAB;  Service: Cardiovascular;  Laterality: Bilateral;  . LOWER EXTREMITY ANGIOGRAM N/A 06/08/2013   Procedure: LOWER EXTREMITY ANGIOGRAM;  Surgeon: Runell Gess, MD;  Location: [facility] CATH LAB;  Service: Cardiovascular;  Laterality: N/A;  . LOWER EXTREMITY ANGIOGRAM N/A 10/28/2013   Procedure: LOWER EXTREMITY ANGIOGRAM;  Surgeon: Runell Gess,  MD;  Location: [facility] CATH LAB;  Service: Cardiovascular;  Laterality: N/A;  . LOWER EXTREMITY ANGIOGRAM N/A 05/16/2014   Procedure: LOWER EXTREMITY ANGIOGRAM;  Surgeon: Runell Gess, MD;  Location: [facility] CATH LAB;  Service: Cardiovascular;  Laterality: N/A;  . PERCUTANEOUS STENT INTERVENTION Right 05/10/2013   Procedure: PERCUTANEOUS STENT INTERVENTION;  Surgeon: Runell Gess, MD;  Location: [facility]  CATH LAB;  Service: Cardiovascular;  Laterality: Right;  rt sfa and popliteal stent x2  . PERIPHERAL VASCULAR CATHETERIZATION N/A 12/06/2015   Procedure: Abdominal Aortogram w/Lower Extremity;  Surgeon: Nada Libman, MD;  Location: [facility] INVASIVE CV LAB;  Service: Cardiovascular;  Laterality: N/A;  . PERIPHERAL VASCULAR CATHETERIZATION N/A 05/22/2016   Procedure: Abdominal Aortogram w/Lower Extremity;  Surgeon: Maeola Harman, MD;  Location: [facility] INVASIVE CV LAB;  Service: Cardiovascular;  Laterality: N/A;  . PERIPHERAL VASCULAR CATHETERIZATION N/A 09/17/2016   Procedure: Abdominal Aortogram w/Lower Extremity;  Surgeon: Nada Libman, MD;  Location: [facility] INVASIVE CV LAB;  Service: Cardiovascular;  Laterality: N/A;  . THROMBECTOMY FEMORAL ARTERY Left 05/21/2016   Procedure: THROMBECTOMY FEMORAL TO ANTERIOR TIBIAL BYPASS GRAFT;  Surgeon: Nada Libman, MD;  Location: [facility] OR;  Service: Vascular;  Laterality: Left;  Marland Kitchen VEIN HARVEST Left 12/08/2015   Procedure: USING NON REVERSE  LEFT GREATER SAPHENOUS VEIN;  Surgeon: Nada Libman, MD;  Location: [facility] OR;  Service: Vascular;  Laterality: Left;     A IV Location/Drains/Wounds Patient Lines/Drains/Airways Status   Active Line/Drains/Airways    Name:   Placement date:   Placement time:   Site:   Days:   Peripheral IV 12/17/18 Left Wrist   12/17/18    1322    Wrist   less than 1   Peripheral IV 12/17/18 Left Forearm   12/17/18    1400    Forearm   less than 1          Intake/Output Last 24 hours  Intake/Output Summary (Last 24 hours) at 12/17/2018 1734 Last data filed at 12/17/2018 1723 Gross per 24 hour  Intake 2271.77 ml  Output 450 ml  Net 1821.77 ml    Labs/Imaging Results for orders placed or performed during the hospital encounter of 12/17/18 (from the past 48 hour(s))  CBC with Differential     Status: Abnormal   Collection Time: 12/17/18  1:53 PM  Result Value Ref Range   WBC 19.5 (H) 4.0 - 10.5 K/uL   RBC 5.12 4.22 - 5.81  MIL/uL   Hemoglobin 15.4 13.0 - 17.0 g/dL   HCT 30.8 65.7 - 84.6 %   MCV 91.0 80.0 - 100.0 fL   MCH 30.1 26.0 - 34.0 pg   MCHC 33.0 30.0 - 36.0 g/dL   RDW 96.2 95.2 - 84.1 %   Platelets 302 150 - 400 K/uL   nRBC 0.0 0.0 - 0.2 %   Neutrophils Relative % 90 %   Neutro Abs 17.6 (H) 1.7 - 7.7 K/uL   Lymphocytes Relative 4 %   Lymphs Abs 0.7 0.7 - 4.0 K/uL   Monocytes Relative 5 %   Monocytes Absolute 1.0 0.1 - 1.0 K/uL   Eosinophils Relative 0 %   Eosinophils Absolute 0.0 0.0 - 0.5 K/uL   Basophils Relative 0 %   Basophils Absolute 0.1 0.0 - 0.1 K/uL   Immature Granulocytes 1 %   Abs Immature Granulocytes 0.11 (H) 0.00 - 0.07 K/uL    Comment: Performed at [facility], [address], [place], [place]  [id number]  Brain natriuretic peptide     Status: Abnormal   Collection Time: 12/17/18  1:53 PM  Result Value Ref Range   B Natriuretic Peptide 102.2 (H) 0.0 - 100.0 pg/mL    Comment: Performed at [facility], [address] [place], [place] [address]  Lactic acid, plasma     Status: Abnormal   Collection Time: 12/17/18  1:53 PM  Result Value Ref Range   Lactic Acid, Venous 6.5 (HH) 0.5 - 1.9 mmol/L    Comment: CRITICAL RESULT CALLED TO, READ BACK BY AND VERIFIED WITH: Kameryn Tisdel,S RN @ 1440 12/17/18 [name],A Performed at [facility], [address] [place], [place] [address]   SARS Coronavirus 2 [facility](Hospital order, Performed in [facility] hospital lab)     Status: None   Collection Time: 12/17/18  1:53 PM  Result Value Ref Range   SARS Coronavirus 2 NEGATIVE NEGATIVE    Comment: (NOTE) If result is NEGATIVE SARS-CoV-2 target nucleic acids are NOT DETECTED. The SARS-CoV-2 RNA is generally detectable in upper and lower  respiratory specimens during the acute phase of infection. The lowest  concentration of SARS-CoV-2 viral copies this assay can detect is 250  copies / mL. A negative result does not preclude SARS-CoV-2 infection  and should not be used as the  sole basis for treatment or other  patient management decisions.  A negative result may occur with  improper specimen collection / handling, submission of specimen other  than nasopharyngeal swab, presence of viral mutation(s) within the  areas targeted by this assay, and inadequate number of viral copies  (<250 copies / mL). A negative result must be combined with clinical  observations, patient history, and epidemiological information. If result is POSITIVE SARS-CoV-2 target nucleic acids are DETECTED. The SARS-CoV-2 RNA is generally detectable in upper and lower  respiratory specimens dur ing the acute phase of infection.  Positive  results are indicative of active infection with SARS-CoV-2.  Clinical  correlation with patient history and other diagnostic information is  necessary to determine patient infection status.  Positive results do  not rule out bacterial infection or co-infection with other viruses. If result is PRESUMPTIVE POSTIVE SARS-CoV-2 nucleic acids MAY BE PRESENT.   A presumptive positive result was obtained on the submitted specimen  and confirmed on repeat testing.  While 2019 novel coronavirus  (SARS-CoV-2) nucleic acids may be present in the submitted sample  additional confirmatory testing may be necessary for epidemiological  and / or clinical management purposes  to differentiate between  SARS-CoV-2 and other Sarbecovirus currently known to infect humans.  If clinically indicated additional testing with an alternate test  methodology [phone]([id number]) is advised. The SARS-CoV-2 RNA is generally  detectable in upper and lower respiratory sp ecimens during the acute  phase of infection. The expected result is Negative. Fact Sheet for Patients:  BoilerBrush.com.cyhttps://www.fda.gov/media/136312/download Fact Sheet for Healthcare Providers: https://pope.com/https://www.fda.gov/media/136313/download This test is not yet approved or cleared by the [place] FDA and has been authorized for detection  and/or diagnosis of SARS-CoV-2 by FDA under an Emergency Use Authorization (EUA).  This EUA will remain in effect (meaning this test can be used) for the duration of the COVID-19 declaration under Section 564(b)(1) of the Act, 21 U.S.C. section 360bbb-3(b)(1), unless the authorization is terminated or revoked sooner. Performed at [facility], [address] [place], [place] [address]   Comprehensive metabolic panel     Status: Abnormal   Collection Time: 12/17/18  1:53 PM  Result Value Ref Range   Sodium 138 135 - 145 mmol/L   Potassium 3.7 3.5 - 5.1 mmol/L   Chloride 103 98 - 111 mmol/L   CO2 18 (L) 22 - 32 mmol/L   Glucose, Bld 181 (H) 70 - 99 mg/dL   BUN 11 8 - 23 mg/dL   Creatinine, Ser 4.09 0.61 - 1.24 mg/dL   Calcium 9.2 8.9 - 81.1 mg/dL   Total Protein 8.1 6.5 - 8.1 g/dL   Albumin 3.9 3.5 - 5.0 g/dL   AST 37 15 - 41 U/L   ALT 26 0 - 44 U/L   Alkaline Phosphatase 77 38 - 126 U/L   Total Bilirubin 0.7 0.3 - 1.2 mg/dL   GFR calc non Af Amer >60 >60 mL/min   GFR calc Af Amer >60 >60 mL/min   Anion gap 17 (H) 5 - 15    Comment: Performed at [facility], [address] [place], [place] [address]  D-dimer, quantitative     Status: Abnormal   Collection Time: 12/17/18  1:53 PM  Result Value Ref Range   D-Dimer, Quant 2.82 (H) 0.00 - 0.50 ug/mL-FEU    Comment: (NOTE) At the manufacturer cut-off of 0.50 ug/mL FEU, this assay has been documented to exclude PE with a sensitivity and negative predictive value of 97 to 99%.  At this time, this assay has not been approved by the FDA to exclude DVT/VTE. Results should be correlated with clinical presentation. Performed at [facility], [address], [place], [place] [address]   Procalcitonin     Status: None   Collection Time: 12/17/18  1:53 PM  Result Value Ref Range   Procalcitonin <0.10 ng/mL    Comment:        Interpretation: PCT (Procalcitonin) <= 0.5 ng/mL: Systemic infection (sepsis) is not  likely. Local bacterial infection is possible. (NOTE)       Sepsis PCT Algorithm           Lower Respiratory Tract                                      Infection PCT Algorithm    ----------------------------     ----------------------------         PCT < 0.25 ng/mL                PCT < 0.10 ng/mL         Strongly encourage             Strongly discourage   discontinuation of antibiotics    initiation of antibiotics    ----------------------------     -----------------------------       PCT 0.25 - 0.50 ng/mL            PCT 0.10 - 0.25 ng/mL               OR       >80% decrease in PCT            Discourage initiation of                                            antibiotics      Encourage discontinuation  of antibiotics    ----------------------------     -----------------------------         PCT >= 0.50 ng/mL              PCT 0.26 - 0.50 ng/mL               AND        <80% decrease in PCT             Encourage initiation of                                             antibiotics       Encourage continuation           of antibiotics    ----------------------------     -----------------------------        PCT >= 0.50 ng/mL                  PCT > 0.50 ng/mL               AND         increase in PCT                  Strongly encourage                                      initiation of antibiotics    Strongly encourage escalation           of antibiotics                                     -----------------------------                                           PCT <= 0.25 ng/mL                                                 OR                                        > 80% decrease in PCT                                     Discontinue / Do not initiate                                             antibiotics Performed at [facility], [address], [place], [place] [address]   Lactate dehydrogenase     Status: None   Collection Time: 12/17/18  1:53 PM  Result Value  Ref Range   LDH 170 98 - 192 U/L  Comment: Performed at [facility], [address] [place], [place] [address]  Ferritin     Status: None   Collection Time: 12/17/18  1:53 PM  Result Value Ref Range   Ferritin 188 24 - 336 ng/mL    Comment: Performed at [facility], [address] [place], [place] [address]  Triglycerides     Status: None   Collection Time: 12/17/18  1:53 PM  Result Value Ref Range   Triglycerides 70 <150 mg/dL    Comment: Performed at [facility], [address] [place], [place] [address]  Fibrinogen     Status: Abnormal   Collection Time: 12/17/18  1:53 PM  Result Value Ref Range   Fibrinogen 493 (H) 210 - 475 mg/dL    Comment: Performed at [facility], [address] [place], [place] [address]  C-reactive protein     Status: None   Collection Time: 12/17/18  1:53 PM  Result Value Ref Range   CRP 0.9 <1.0 mg/dL    Comment: Performed at [facility], [address], [place], [place] [address]  Troponin I - Add-On to previous collection     Status: Abnormal   Collection Time: 12/17/18  1:53 PM  Result Value Ref Range   Troponin I 0.03 (HH) <0.03 ng/mL    Comment: CRITICAL RESULT CALLED TO, READ BACK BY AND VERIFIED WITHCheryl Flash 1545 12/17/2018 WBOND Performed at [facility], [address] [place], [place] [address]   Lactic acid, plasma     Status: Abnormal   Collection Time: 12/17/18  3:57 PM  Result Value Ref Range   Lactic Acid, Venous 5.4 (HH) 0.5 - 1.9 mmol/L    Comment: CRITICAL RESULT CALLED TO, READ BACK BY AND VERIFIED WITHCheryl Flash 5784 12/17/2018 WBOND Performed at [facility], [address], [place], [place] [address]   Urinalysis, Routine w reflex microscopic     Status: Abnormal   Collection Time: 12/17/18  4:24 PM  Result Value Ref Range   Color, Urine YELLOW YELLOW   APPearance CLEAR CLEAR   Specific Gravity, Urine 1.017 1.005 - 1.030   pH 6.0 5.0 - 8.0   Glucose, UA  NEGATIVE NEGATIVE mg/dL   Hgb urine dipstick NEGATIVE NEGATIVE   Bilirubin Urine NEGATIVE NEGATIVE   Ketones, ur 5 (A) NEGATIVE mg/dL   Protein, ur NEGATIVE NEGATIVE mg/dL   Nitrite NEGATIVE NEGATIVE   Leukocytes,Ua NEGATIVE NEGATIVE    Comment: Performed at [facility], [address] [place], [place] [address]   Dg Chest Port 1 View  Result Date: 12/17/2018 CLINICAL DATA:  73 year old male with shortness of breath EXAM: PORTABLE CHEST 1 VIEW COMPARISON:  Prior chest x-ray 09/21/2016 and 01/02/2016 FINDINGS: Cardiac and mediastinal contours remain within normal limits. Atherosclerotic calcifications again noted in the transverse aorta. The lungs remain slightly hyperinflated with chronic background bronchitic changes. Mild biapical pleuroparenchymal scarring. No evidence of pulmonary edema, pleural effusion, pneumothorax or focal airspace consolidation. Calcified granuloma overlying the periphery of the right lower lobe appears similar dating back to 2017. IMPRESSION: Stable chest x-ray without evidence of acute cardiopulmonary process. Background pulmonary parenchymal changes consistent with COPD. Aortic Atherosclerosis (ICD10-170.0) Electronically Signed   By: Malachy Moan M.D.   On: 12/17/2018 14:36    Pending Labs Unresulted Labs (From admission, onward)    Start     Ordered   12/18/18 0500  Comprehensive metabolic panel  Tomorrow morning,   R     12/17/18 1725   12/17/18 2000  Troponin I - Now Then Q6H  Now then every 6 hours,   R     12/17/18 1556   12/17/18 1340  Blood culture (routine x 2)  BLOOD CULTURE X 2,   STAT     12/17/18 1339          Vitals/Pain Today's Vitals   12/17/18 1630 12/17/18 1645 12/17/18 1700 12/17/18 1708  BP: 140/75 (!) 154/93 134/71   Pulse: 94 98 93   Resp: (!) 21 (!) 26 18   Temp:    98.4 F (36.9 C)  TempSrc:    Oral  SpO2: 98% 96% 96%   Weight:      Height:      PainSc:        Isolation Precautions No active  isolations  Medications Medications  oxyCODONE (Oxy IR/ROXICODONE) immediate release tablet 15 mg (has no administration in time range)  busPIRone (BUSPAR) tablet 7.5 mg (has no administration in time range)  sertraline (ZOLOFT) tablet 25 mg (has no administration in time range)  clopidogrel (PLAVIX) tablet 75 mg (has no administration in time range)  enoxaparin (LOVENOX) injection 40 mg (has no administration in time range)  sodium chloride flush (NS) 0.9 % injection 3 mL (has no administration in time range)  acetaminophen (TYLENOL) tablet 650 mg (has no administration in time range)    Or  acetaminophen (TYLENOL) suppository 650 mg (has no administration in time range)  docusate sodium (COLACE) capsule 100 mg (has no administration in time range)  polyethylene glycol (MIRALAX / GLYCOLAX) packet 17 g (has no administration in time range)  ondansetron (ZOFRAN) tablet 4 mg (has no administration in time range)    Or  ondansetron (ZOFRAN) injection 4 mg (has no administration in time range)  0.9 %  sodium chloride infusion (has no administration in time range)  sodium chloride 0.9 % bolus 1,000 mL (has no administration in time range)  cefTRIAXone (ROCEPHIN) 2 g in sodium chloride 0.9 % 100 mL IVPB (has no administration in time range)  azithromycin (ZITHROMAX) 500 mg in sodium chloride 0.9 % 250 mL IVPB (has no administration in time range)  acetaminophen (TYLENOL) tablet 1,000 mg (1,000 mg Oral Given 12/17/18 1402)  sodium chloride 0.9 % bolus 1,000 mL (0 mLs Intravenous Stopped 12/17/18 1505)  sodium chloride 0.9 % bolus 1,178 mL (0 mLs Intravenous Stopped 12/17/18 1649)  cefTRIAXone (ROCEPHIN) 2 g in sodium chloride 0.9 % 100 mL IVPB (0 g Intravenous Stopped 12/17/18 1619)  iohexol (OMNIPAQUE) 350 MG/ML injection 80 mL (80 mLs Intravenous Contrast Given 12/17/18 1721)    Mobility walks Low fall risk   Focused Assessments Pulmonary Assessment Handoff:  Lung sounds: Bilateral Breath  Sounds: Clear L Breath Sounds: Clear R Breath Sounds: Clear O2 Device: Nasal Cannula O2 Flow Rate (L/min): 3 L/min      R Recommendations: See Admitting Provider Note  Report given to:   Additional Notes:

## 2018-12-17 NOTE — ED Triage Notes (Signed)
Pt from home, reports SOB and productive cough x 2 days. 100.3 fever. A&O x4.

## 2018-12-18 DIAGNOSIS — A419 Sepsis, unspecified organism: Principal | ICD-10-CM

## 2018-12-18 DIAGNOSIS — R6520 Severe sepsis without septic shock: Secondary | ICD-10-CM

## 2018-12-18 LAB — CBC WITH DIFFERENTIAL/PLATELET
Abs Immature Granulocytes: 0.08 10*3/uL — ABNORMAL HIGH (ref 0.00–0.07)
Basophils Absolute: 0.1 10*3/uL (ref 0.0–0.1)
Basophils Relative: 0 %
Eosinophils Absolute: 0.1 10*3/uL (ref 0.0–0.5)
Eosinophils Relative: 0 %
HCT: 39.6 % (ref 39.0–52.0)
Hemoglobin: 13.1 g/dL (ref 13.0–17.0)
Immature Granulocytes: 1 %
Lymphocytes Relative: 5 %
Lymphs Abs: 0.8 10*3/uL (ref 0.7–4.0)
MCH: 30 pg (ref 26.0–34.0)
MCHC: 33.1 g/dL (ref 30.0–36.0)
MCV: 90.8 fL (ref 80.0–100.0)
Monocytes Absolute: 1 10*3/uL (ref 0.1–1.0)
Monocytes Relative: 6 %
Neutro Abs: 14.6 10*3/uL — ABNORMAL HIGH (ref 1.7–7.7)
Neutrophils Relative %: 88 %
Platelets: 246 10*3/uL (ref 150–400)
RBC: 4.36 MIL/uL (ref 4.22–5.81)
RDW: 14.2 % (ref 11.5–15.5)
WBC: 16.6 10*3/uL — ABNORMAL HIGH (ref 4.0–10.5)
nRBC: 0 % (ref 0.0–0.2)

## 2018-12-18 LAB — COMPREHENSIVE METABOLIC PANEL
ALT: 20 U/L (ref 0–44)
AST: 27 U/L (ref 15–41)
Albumin: 3.1 g/dL — ABNORMAL LOW (ref 3.5–5.0)
Alkaline Phosphatase: 59 U/L (ref 38–126)
Anion gap: 9 (ref 5–15)
BUN: 7 mg/dL — ABNORMAL LOW (ref 8–23)
CO2: 22 mmol/L (ref 22–32)
Calcium: 8.1 mg/dL — ABNORMAL LOW (ref 8.9–10.3)
Chloride: 105 mmol/L (ref 98–111)
Creatinine, Ser: 0.83 mg/dL (ref 0.61–1.24)
GFR calc Af Amer: >60 mL/min (ref >60)
GFR calc non Af Amer: >60 mL/min (ref >60)
Glucose, Bld: 96 mg/dL (ref 70–99)
Potassium: 3.6 mmol/L (ref 3.5–5.1)
Sodium: 136 mmol/L (ref 135–145)
Total Bilirubin: 1.1 mg/dL (ref 0.3–1.2)
Total Protein: 6.3 g/dL — ABNORMAL LOW (ref 6.5–8.1)

## 2018-12-18 LAB — TROPONIN I
Troponin I: 0.05 ng/mL (ref <0.03)
Troponin I: 0.04 ng/mL (ref <0.03)

## 2018-12-18 LAB — LACTIC ACID, PLASMA: Lactic Acid, Venous: 1.2 mmol/L (ref 0.5–1.9)

## 2018-12-18 MED ORDER — SODIUM CHLORIDE 0.9 % IV SOLN
3.0000 g | Freq: Four times a day (QID) | INTRAVENOUS | Status: DC
  Administered 2018-12-18 – 2018-12-22 (×15): 3 g via INTRAVENOUS
  Filled 2018-12-18 (×22): qty 3

## 2018-12-18 MED ORDER — PREDNISONE 20 MG PO TABS
40.0000 mg | ORAL_TABLET | Freq: Every day | ORAL | Status: DC
  Administered 2018-12-18 – 2018-12-19 (×2): 40 mg via ORAL
  Filled 2018-12-18 (×2): qty 2

## 2018-12-18 MED ORDER — BUDESONIDE 0.25 MG/2ML IN SUSP
0.2500 mg | Freq: Two times a day (BID) | RESPIRATORY_TRACT | Status: DC
  Administered 2018-12-18 – 2018-12-24 (×12): 0.25 mg via RESPIRATORY_TRACT
  Filled 2018-12-18 (×14): qty 2

## 2018-12-18 MED ORDER — GUAIFENESIN-DM 100-10 MG/5ML PO SYRP: 5.0000 mL | ORAL_SOLUTION | ORAL | Status: DC | PRN

## 2018-12-18 MED ORDER — IPRATROPIUM-ALBUTEROL 0.5-2.5 (3) MG/3ML IN SOLN
3.0000 mL | Freq: Three times a day (TID) | RESPIRATORY_TRACT | Status: DC
  Administered 2018-12-19 – 2018-12-20 (×4): 3 mL via RESPIRATORY_TRACT
  Filled 2018-12-18 (×4): qty 3

## 2018-12-18 MED ORDER — IPRATROPIUM-ALBUTEROL 0.5-2.5 (3) MG/3ML IN SOLN
3.0000 mL | Freq: Four times a day (QID) | RESPIRATORY_TRACT | Status: DC
  Administered 2018-12-18 (×3): 3 mL via RESPIRATORY_TRACT
  Filled 2018-12-18 (×3): qty 3

## 2018-12-18 MED ORDER — SODIUM CHLORIDE 0.9% FLUSH
10.0000 mL | Freq: Two times a day (BID) | INTRAVENOUS | Status: DC
  Administered 2018-12-18: 10 mL

## 2018-12-18 MED ORDER — SODIUM CHLORIDE 0.9 % IV BOLUS
500.0000 mL | Freq: Once | INTRAVENOUS | Status: AC
  Administered 2018-12-18: 500 mL via INTRAVENOUS

## 2018-12-18 NOTE — Progress Notes (Addendum)
PROGRESS NOTE  Christopher Pena QGB:[id number] DOB: 02-Mar-1946 DOA: 12/17/2018 PCP: Sheliah Hatch, MD   LOS: 1 day   Brief Narrative / Interim history: 73 year old male with history of PAD with stent in place, hypertension, COPD not on chronic oxygen, chronic back pain, tobacco abuse in remission quit 3 years ago presented to the hospital on 12/17/2018 with cough and shortness of breath.  Patient reports feeling well at his baseline, ate some chicken the night before and felt like it got stuck in his throat and aspirated.  He started coughing and felt like they more more short of breath afterwards.  Then he developed a fever as well.  He presented to the hospital.  He was tested for Covid which was negative.  He was treated for sepsis, placed on ceftriaxone and azithromycin and was admitted to the hospital.  Subjective: Continues to feel short of breath this morning, not yet able to speak in full sentences.  He is on 3 L nasal cannula.  Visibly tachypneic.  He denies any chest pain, denies any abdominal pain, nausea or vomiting.  Assessment & Plan: Principal Problem:   Sepsis (HCC) Active Problems:   COPD II/III    HTN (hypertension)   Peripheral vascular disease, unspecified (HCC)   Acute on chronic respiratory failure with hypoxia (HCC)   Principal Problem Sepsis due to aspiration pneumonia/pneumonitis -Patient was placed on ceftriaxone and azithromycin, will switch to Unasyn and continue azithromycin for atypical coverage.  Underwent a CT angiogram which was negative for PE, however it did showright middle lobe with endobronchial debris, bronchial wall thickening, and tree-in-bud opacity of the right lower lobe. Given the degree of endobronchial debris and nodular changes, short-term follow-up with contrast-enhanced CT is recommended in 4 weeks-6 weeks once the patient has been treated to assure resolution and the absence of an endobronchial lesion. -Continue IV fluids -CT scan  reviewed with Dr. Tonia Brooms with critical care, he does have a right bronchial lesion which may be tumor versus food, patient is clinically stable, reevaluate in the afternoon and much improved compared to this morning, for now continue to observe, repeat a chest x-ray tomorrow morning.  Call back critical care if patient lacks improvement over the weekend.  May consider repeat CT scan on Sunday if he continues to be hypoxic  Active Problems Acute hypoxic respiratory failure due to aspiration pneumonia, COPD exacerbation -Place patient on duo nebs, Pulmicort, will add prednisone and continue antibiotics as above. -Currently requiring 3 L nasal cannula however he has significantly increased work of breathing and will transfer to higher level of care from medsurg and monitor in stepdown -Patient describes a degree of chronic shortness of breath at home for a number of months and is wondering whether he will need oxygen at home, this is to be determined on the day of discharge.  Peripheral vascular disease -Continue Plavix, not taking statins anymore  Hypertension -Currently not on any medications, monitor  Chronic pain -Continue home oxycodone  Depression/anxiety -Continue Zoloft, BuSpar  Scheduled Meds: . budesonide (PULMICORT) nebulizer solution  0.25 mg Nebulization BID  . busPIRone  7.5 mg Oral BID  . clopidogrel  75 mg Oral Daily  . docusate sodium  100 mg Oral BID  . enoxaparin (LOVENOX) injection  40 mg Subcutaneous Q24H  . ipratropium-albuterol  3 mL Nebulization Q6H  . oxyCODONE  15 mg Oral Q4H  . predniSONE  40 mg Oral Q breakfast  . sertraline  25 mg Oral Daily  . sodium  chloride flush  10-40 mL Intracatheter Q12H  . sodium chloride flush  3 mL Intravenous Q12H   Continuous Infusions: . sodium chloride 100 mL/hr at 12/18/18 0914  . ampicillin-sulbactam (UNASYN) IV 3 g (12/18/18 0918)  . azithromycin 500 mg (12/17/18 1748)   PRN Meds:.acetaminophen **OR** acetaminophen,  albuterol, guaiFENesin-dextromethorphan, ondansetron **OR** ondansetron (ZOFRAN) IV, polyethylene glycol  DVT prophylaxis: Lovenox Code Status: DNR Family Communication: no family at bedside  Disposition Plan: home when ready   Consultants:   None   Procedures:   None   Antimicrobials:  Unasyn / Azithromycin 5/1 >>   Objective: Vitals:   12/18/18 0417 12/18/18 0647 12/18/18 0834 12/18/18 0852  BP: 124/70  126/74   Pulse: 79  91   Resp: 16  (!) 22   Temp: 98.5 F (36.9 C)  98.5 F (36.9 C)   TempSrc: Oral  Oral   SpO2: 95%  96% 98%  Weight:  72.9 kg    Height:   (1.702 m)      Intake/Output Summary (Last 24 hours) at 12/18/2018 1053 Last data filed at 12/18/2018 0829 Gross per 24 hour  Intake 2271.77 ml  Output 2650 ml  Net -378.23 ml   Filed Weights   12/17/18 1325 12/18/18 0647  Weight: 72.6 kg 72.9 kg    Examination:  Constitutional: Appears slightly anxious, tachypneic Eyes: PERRL, lids and conjunctivae normal ENMT: Mucous membranes are moist.  Respiratory: Overall distant breath sounds, faint end expiratory wheezing, no crackles heard.  Using accessory muscles to breathe, tachypneic Cardiovascular: Regular rate and rhythm, no murmurs / rubs / gallops. No LE edema. Abdomen: no tenderness. Bowel sounds positive.  Musculoskeletal: no clubbing / cyanosis. Skin: no rashes Neurologic: CN 2-12 grossly intact. Strength 5/5 in all 4.  Psychiatric: Normal judgment and insight. Alert and oriented x 3. Normal mood.    Data Reviewed: I have independently reviewed following labs and imaging studies   CBC: Recent Labs  Lab 12/17/18 1353 12/18/18 0610  WBC 19.5* 16.6*  NEUTROABS 17.6* 14.6*  HGB 15.4 13.1  HCT 46.6 39.6  MCV 91.0 90.8  PLT 302 246   Basic Metabolic Panel: Recent Labs  Lab 12/17/18 1353 12/18/18 0610  NA 138 136  K 3.7 3.6  CL 103 105  CO2 18* 22  GLUCOSE 181* 96  BUN 11 7*  CREATININE 1.16 0.83  CALCIUM 9.2 8.1*   GFR:  Estimated Creatinine Clearance: 75.2 mL/min (by C-G formula based on SCr of 0.83 mg/dL). Liver Function Tests: Recent Labs  Lab 12/17/18 1353 12/18/18 0610  AST 37 27  ALT 26 20  ALKPHOS 77 59  BILITOT 0.7 1.1  PROT 8.1 6.3*  ALBUMIN 3.9 3.1*   No results for input(s): LIPASE, AMYLASE in the last 168 hours. No results for input(s): AMMONIA in the last 168 hours. Coagulation Profile: No results for input(s): INR, PROTIME in the last 168 hours. Cardiac Enzymes: Recent Labs  Lab 12/17/18 1353 12/17/18 1909 12/18/18 0211 12/18/18 0610  TROPONINI 0.03* 0.04* 0.05* 0.04*   BNP (last 3 results) No results for input(s): PROBNP in the last 8760 hours. HbA1C: Recent Labs    12/17/18 1909  HGBA1C 5.7*   CBG: No results for input(s): GLUCAP in the last 168 hours. Lipid Profile: Recent Labs    12/17/18 1353  TRIG 70   Thyroid Function Tests: No results for input(s): TSH, T4TOTAL, FREET4, T3FREE, THYROIDAB in the last 72 hours. Anemia Panel: Recent Labs    12/17/18 1353  FERRITIN 188   Urine analysis:    Component Value Date/Time   COLORURINE YELLOW 12/17/2018 1624   APPEARANCEUR CLEAR 12/17/2018 1624   LABSPEC 1.017 12/17/2018 1624   PHURINE 6.0 12/17/2018 1624   GLUCOSEU NEGATIVE 12/17/2018 1624   HGBUR NEGATIVE 12/17/2018 1624   BILIRUBINUR NEGATIVE 12/17/2018 1624   KETONESUR 5 (A) 12/17/2018 1624   PROTEINUR NEGATIVE 12/17/2018 1624   UROBILINOGEN 0.2 11/11/2013 1245   NITRITE NEGATIVE 12/17/2018 1624   LEUKOCYTESUR NEGATIVE 12/17/2018 1624   Sepsis Labs: Invalid input(s): PROCALCITONIN, LACTICIDVEN  Recent Results (from the past 240 hour(s))  Blood culture (routine x 2)     Status: None (Preliminary result)   Collection Time: 12/17/18  1:53 PM  Result Value Ref Range Status   Specimen Description BLOOD LEFT FOREARM  Final   Special Requests   Final    BOTTLES DRAWN AEROBIC AND ANAEROBIC Blood Culture adequate volume Performed at [facility]  Lab, [address], [place], [place] [address]    Culture PENDING  Incomplete   Report Status PENDING  Incomplete  SARS Coronavirus 2 [facility] order, Performed in [facility] hospital lab)     Status: None   Collection Time: 12/17/18  1:53 PM  Result Value Ref Range Status   SARS Coronavirus 2 NEGATIVE NEGATIVE Final    Comment: (NOTE) If result is NEGATIVE SARS-CoV-2 target nucleic acids are NOT DETECTED. The SARS-CoV-2 RNA is generally detectable in upper and lower  respiratory specimens during the acute phase of infection. The lowest  concentration of SARS-CoV-2 viral copies this assay can detect is 250  copies / mL. A negative result does not preclude SARS-CoV-2 infection  and should not be used as the sole basis for treatment or other  patient management decisions.  A negative result may occur with  improper specimen collection / handling, submission of specimen other  than nasopharyngeal swab, presence of viral mutation(s) within the  areas targeted by this assay, and inadequate number of viral copies  (<250 copies / mL). A negative result must be combined with clinical  observations, patient history, and epidemiological information. If result is POSITIVE SARS-CoV-2 target nucleic acids are DETECTED. The SARS-CoV-2 RNA is generally detectable in upper and lower  respiratory specimens dur ing the acute phase of infection.  Positive  results are indicative of active infection with SARS-CoV-2.  Clinical  correlation with patient history and other diagnostic information is  necessary to determine patient infection status.  Positive results do  not rule out bacterial infection or co-infection with other viruses. If result is PRESUMPTIVE POSTIVE SARS-CoV-2 nucleic acids MAY BE PRESENT.   A presumptive positive result was obtained on the submitted specimen  and confirmed on repeat testing.  While 2019 novel coronavirus  (SARS-CoV-2) nucleic acids may be present in the submitted sample   additional confirmatory testing may be necessary for epidemiological  and / or clinical management purposes  to differentiate between  SARS-CoV-2 and other Sarbecovirus currently known to infect humans.  If clinically indicated additional testing with an alternate test  methodology [id number]) is advised. The SARS-CoV-2 RNA is generally  detectable in upper and lower respiratory sp ecimens during the acute  phase of infection. The expected result is Negative. Fact Sheet for Patients:  BoilerBrush.com.cy Fact Sheet for Healthcare Providers: https://pope.com/ This test is not yet approved or cleared by the [place] FDA and has been authorized for detection and/or diagnosis of SARS-CoV-2 by FDA under an Emergency Use Authorization (EUA).  This  EUA will remain in effect (meaning this test can be used) for the duration of the COVID-19 declaration under Section 564(b)(1) of the Act, 21 U.S.C. section 360bbb-3(b)(1), unless the authorization is terminated or revoked sooner. Performed at [facility], [address] [place], [place] [address]       Radiology Studies: Ct Angio Chest Pe W And/or Wo Contrast  Result Date: 12/17/2018 CLINICAL DATA:  73 year old male with shortness of breath EXAM: CT ANGIOGRAPHY CHEST WITH CONTRAST TECHNIQUE: Multidetector CT imaging of the chest was performed using the standard protocol during bolus administration of intravenous contrast. Multiplanar CT image reconstructions and MIPs were obtained to evaluate the vascular anatomy. CONTRAST:  80mL OMNIPAQUE IOHEXOL 350 MG/ML SOLN COMPARISON:  Chest x-ray 01/02/2016, 09/21/2016, 12/17/2018, 05/04/2013 FINDINGS: Cardiovascular: Heart: No cardiomegaly. No pericardial fluid/thickening. Minimal calcified atherosclerosis of the circumflex coronary artery. Aorta: Unremarkable course, caliber, contour of the thoracic aorta. No aneurysm or dissection flap. No periaortic  fluid. Mild atherosclerosis. Branch vessels are patent. Pulmonary arteries: No central, lobar, segmental, or proximal subsegmental filling defects. Mediastinum/Nodes: No mediastinal adenopathy. Unremarkable appearance of the thoracic esophagus. Unremarkable thoracic inlet Lungs/Pleura: Pleuroparenchymal scarring at the apices. Centrilobular and paraseptal emphysema. There is apparent occlusion of the distal right mainstem bronchus and bronchus intermedius with endobronchial debris. Debris extends into the bronchi of the middle lobe and into the right lower lobe. Bronchial wall thickening present of the middle lobe and right lower lobe branches. Associated tree-in-bud pattern of opacity of the right lower lobe with no pleural effusion. There is 10 mm nodule at the periphery of the right lower lobe on image 66 of series 7. No pneumothorax Upper Abdomen: No acute. Musculoskeletal: No acute displaced fracture. Degenerative changes of the spine. Review of the MIP images confirms the above findings. IMPRESSION: CT negative for pulmonary emboli. Bronchitis/bronchiolitis of the right lower lobe and to a lesser degree the right middle lobe with endobronchial debris, bronchial wall thickening, and tree-in-bud opacity of the right lower lobe. Given the degree of endobronchial debris and nodular changes, short-term follow-up with contrast-enhanced CT is recommended in 4 weeks-6 weeks once the patient has been treated to assure resolution and the absence of an endobronchial lesion. Alternatively, referral for pulmonary evaluation and bronchoscopy may be considered. 1 cm nodule at the periphery of the right lower lobe. This appears to have been present dating back to chest x-ray 01/02/2016, most likely benign hamartoma or granuloma. Aortic Atherosclerosis (ICD10-I70.0). Associated coronary artery disease Electronically Signed   By: Gilmer MorJaime  Wagner D.O.   On: 12/17/2018 17:54   Dg Chest Port 1 View  Result Date: 12/17/2018  CLINICAL DATA:  73 year old male with shortness of breath EXAM: PORTABLE CHEST 1 VIEW COMPARISON:  Prior chest x-ray 09/21/2016 and 01/02/2016 FINDINGS: Cardiac and mediastinal contours remain within normal limits. Atherosclerotic calcifications again noted in the transverse aorta. The lungs remain slightly hyperinflated with chronic background bronchitic changes. Mild biapical pleuroparenchymal scarring. No evidence of pulmonary edema, pleural effusion, pneumothorax or focal airspace consolidation. Calcified granuloma overlying the periphery of the right lower lobe appears similar dating back to 2017. IMPRESSION: Stable chest x-ray without evidence of acute cardiopulmonary process. Background pulmonary parenchymal changes consistent with COPD. Aortic Atherosclerosis (ICD10-170.0) Electronically Signed   By: Malachy MoanHeath  McCullough M.D.   On: 12/17/2018 14:36    Pamella Pertostin Marah Park, MD, PhD [facility]  Contact via  [email or web address]  [facility] Office Info P: [phone]  F: [phone]

## 2018-12-18 NOTE — Evaluation (Signed)
Clinical/Bedside Swallow Evaluation Patient Details  Name: Felicita GageDavid H Sabado MRN: [id number] Date of Birth: [birth year]  Today's Date: 12/18/2018 Time: SLP Start Time (ACUTE ONLY): 1003 SLP Stop Time (ACUTE ONLY): 1025 SLP Time Calculation (min) (ACUTE ONLY): 22 min  Past Medical History:  Past Medical History:  Diagnosis Date  . Anxiety   . Chronic low back pain    "degenerative spine dx'd ~ 7 yr ago" (06/08/2013)  . Claudication (HCC)   . Constipation due to pain medication   . COPD (chronic obstructive pulmonary disease) (HCC)   . Hypertension    "moderately high; RX didn't help" (06/08/2013) - no longer on meds (as of 09/19/16)  . Neuromuscular disorder (HCC)    degen spine  . Non-healing wound of lower extremity 06/09/2013  . PAD (peripheral artery disease), PTA/stent IDEV & chocolate baloon 06/08/13 04/13/2013   Severely reduced ABI's of 0.3 bilaterally   . Tobacco use 06/09/2013  . Unexplained weight loss    Past Surgical History:  Past Surgical History:  Procedure Laterality Date  . ABDOMINAL AORTAGRAM  05/10/2013   Procedure: ABDOMINAL Ronny FlurryAORTAGRAM;  Surgeon: Runell GessJonathan J Berry, MD;  [facility];  Service: Cardiovascular;;  . ABI     04/09/13; abnormal  . ANGIOPLASTY Left 05/21/2016   Procedure: ballon ANGIOPLASTY of femoral to anterior to tibial bypass graft;  Surgeon: Nada LibmanVance W Brabham, MD;  [facility];  Service: Vascular;  Laterality: Left;  . APPENDECTOMY  05/2001  . FEMORAL ARTERY STENT Right 05/10/13   2 stents Rt SFA  . FEMORAL ARTERY STENT Left 06/08/2013  . FEMORAL-POPLITEAL BYPASS GRAFT Right 11/12/2013   Procedure: RIGHT  FEMORAL-BELOW KNEE POPLITEAL ARTERY BYPASS GRAFT USING NON- REVERSE RIGHT GREATER SAPHENOUS VEIN.;  Surgeon: Nada LibmanVance W Brabham, MD;  Location: [facility] OR;  Service: Vascular;  Laterality: Right;  . FEMORAL-TIBIAL BYPASS GRAFT Left 12/08/2015   Procedure:  LEFT FEMORAL-ANTERIOR TIBIAL ARTERY BYPASS GRAFT;  Surgeon: Nada LibmanVance W Brabham, MD;  [facility];  Service: Vascular;  Laterality: Left;  . FEMORAL-TIBIAL BYPASS GRAFT Left 09/20/2016   Procedure: REDO BYPASS GRAFT FEMORAL-ANTERIOR TIBIAL ARTERY;  Surgeon: Nada LibmanVance W Brabham, MD;  [facility];  Service: Vascular;  Laterality: Left;  . LOWER EXTREMITY ANGIOGRAM Bilateral 05/10/2013   Procedure: LOWER EXTREMITY ANGIOGRAM;  Surgeon: Runell GessJonathan J Berry, MD;  [facility];  Service: Cardiovascular;  Laterality: Bilateral;  . LOWER EXTREMITY ANGIOGRAM N/A 06/08/2013   Procedure: LOWER EXTREMITY ANGIOGRAM;  Surgeon: Runell GessJonathan J Berry, MD;  [facility];  Service: Cardiovascular;  Laterality: N/A;  . LOWER EXTREMITY ANGIOGRAM N/A 10/28/2013   Procedure: LOWER EXTREMITY ANGIOGRAM;  Surgeon: Runell GessJonathan J Berry, MD;  [facility];  Service: Cardiovascular;  Laterality: N/A;  . LOWER EXTREMITY ANGIOGRAM N/A 05/16/2014   Procedure: LOWER EXTREMITY ANGIOGRAM;  Surgeon: Runell GessJonathan J Berry, MD;  [facility];  Service: Cardiovascular;  Laterality: N/A;  . PERCUTANEOUS STENT INTERVENTION Right 05/10/2013   Procedure: PERCUTANEOUS STENT INTERVENTION;  Surgeon: Runell GessJonathan J Berry, MD;  [facility];  Service: Cardiovascular;  Laterality: Right;  rt sfa and popliteal stent x2  . PERIPHERAL VASCULAR CATHETERIZATION N/A 12/06/2015   Procedure: Abdominal Aortogram w/Lower Extremity;  Surgeon: Nada LibmanVance W Brabham, MD;  [facility] INVAS[facility] CV LAB;  Service: Cardiovascular;  Laterality: N/A;  . PERIPHERAL VASCULAR CATHETERIZATION N/A 05/22/2016   Procedure: Abdominal Aortogram w/Lower Extremity;  Surgeon: Maeola HarmanBrandon Christopher Cain, MD;  [facility];  Service: Cardiovascular;  Laterality: N/A;  .  PERIPHERAL VASCULAR CATHETERIZATION N/A 09/17/2016   Procedure: Abdominal Aortogram w/Lower Extremity;  Surgeon: Nada Libman, MD;  [facility] INVASIVE CV LAB;  Service: Cardiovascular;  Laterality: N/A;  . THROMBECTOMY FEMORAL ARTERY Left 05/21/2016   Procedure: THROMBECTOMY  FEMORAL TO ANTERIOR TIBIAL BYPASS GRAFT;  Surgeon: Nada Libman, MD;  Location: [facility] OR;  Service: Vascular;  Laterality: Left;  Marland Kitchen VEIN HARVEST Left 12/08/2015   Procedure: USING NON REVERSE  LEFT GREATER SAPHENOUS VEIN;  Surgeon: Nada Libman, MD;  Location: [facility] OR;  Service: Vascular;  Laterality: Left;   HPI:  Pt is a 73 yo male who presented with PNA. Symptoms began after coughing episode while eating chicken alfredo. CT showed bronchitits of the RLL with tree-in-bud opacity of the RLL and endobronchial debris noted. COVID testing negative. PMH includes anxiety, chronic back pain, COPD, HTN, unexplained weight loss   Assessment / Plan / Recommendation Clinical Impression  Pt's swallow appears functional and without overt signs of aspiration, although he is SOB during intake. SLP provided Min-Mod cues for pt to take rest breaks frequently. He says that for a while now he has been feeling like his throat has been "closing up" but that it feels better today. During coughing episode that precipitated this admission, he says that he had not consumed any liquids - only solids. He denies any previous dysphagia or PNA and believes this to be an isolated episode. Recommend continuing current diet (regular solids, thin liquids) but instructed pt on aspiration precautions and the need to use additional caution while SOB. Encouraged him to select foods that are less dry and softer, to conserve energy. SLP will f/u for tolerance and to determine need for any additional modifications or testing, although given no prior events, this may not be needed. SLP Visit Diagnosis: Dysphagia, unspecified (R13.10)    Aspiration Risk  Mild aspiration risk;Moderate aspiration risk    Diet Recommendation Regular;Thin liquid   Liquid Administration via: Cup;Straw Medication Administration: Whole meds with puree Supervision: Patient able to self feed;Intermittent supervision to cue for compensatory  strategies Compensations: Minimize environmental distractions;Slow rate;Small sips/bites Postural Changes: Seated upright at 90 degrees    Other  Recommendations Oral Care Recommendations: Oral care BID   Follow up Recommendations None      Frequency and Duration min 2x/week  1 week       Prognosis Prognosis for Safe Diet Advancement: Good      Swallow Study   General HPI: Pt is a 73 yo male who presented with PNA. Symptoms began after coughing episode while eating chicken alfredo. CT showed bronchitits of the RLL with tree-in-bud opacity of the RLL and endobronchial debris noted. COVID testing negative. PMH includes anxiety, chronic back pain, COPD, HTN, unexplained weight loss Type of Study: Bedside Swallow Evaluation Previous Swallow Assessment: none in chart Diet Prior to this Study: Regular;Thin liquids Temperature Spikes Noted: Yes(100.5) Respiratory Status: Nasal cannula History of Recent Intubation: No Behavior/Cognition: Alert;Cooperative;Pleasant mood Oral Cavity Assessment: Within Functional Limits Oral Care Completed by SLP: No Oral Cavity - Dentition: Dentures, top;Dentures, bottom Vision: Functional for self-feeding Self-Feeding Abilities: Able to feed self Patient Positioning: Upright in bed Baseline Vocal Quality: Normal Volitional Swallow: Able to elicit    Oral/Motor/Sensory Function Overall Oral Motor/Sensory Function: Within functional limits   Ice Chips Ice chips: Not tested   Thin Liquid Thin Liquid: Within functional limits Presentation: Self Fed;Straw    Nectar Thick Nectar Thick Liquid: Not tested   Honey Thick Honey Thick Liquid:  Not tested   Puree Puree: Within functional limits Presentation: Self Fed;Spoon   Solid     Solid: Within functional limits Presentation: Self Primus Bravo Gianno Volner 12/18/2018,11:42 AM  Ivar Drape, M.A. CCC-SLP Acute Herbalist [phone] Office [phone]

## 2018-12-18 NOTE — Progress Notes (Signed)
Pharmacy Antibiotic Note  Christopher Pena is a 73 y.o. male admitted on 12/17/2018 with pneumonia.  Pharmacy has been consulted for Unasyn dosing.  Plan: Unasyn 3 grams iv Q 6 hours Pharmacy to sign off and follow peripherally changes in renal function  Height: 5\' 7"  (170.2 cm) Weight: 160 lb 11.5 oz (72.9 kg) IBW/kg (Calculated) : 66.1  Temp (24hrs), Avg:98.6 F (37 C), Min:98.2 F (36.8 C), Max:100.5 F (38.1 C)  Recent Labs  Lab 12/17/18 1353 12/17/18 1557 12/17/18 1909 12/17/18 2144 12/18/18 0430 12/18/18 0610  WBC 19.5*  --   --   --   --  16.6*  CREATININE 1.16  --   --   --   --  0.83  LATICACIDVEN 6.5* 5.4* 3.5* 2.2* 1.2  --     Estimated Creatinine Clearance: 75.2 mL/min (by C-G formula based on SCr of 0.83 mg/dL).    Allergies  Allergen Reactions  . Clindamycin/Lincomycin Diarrhea     Thank you for allowing pharmacy to be a part of this patient's care. Okey Regal, PharmD [phone]  12/18/2018 8:36 AM

## 2018-12-19 ENCOUNTER — Inpatient Hospital Stay (HOSPITAL_COMMUNITY): Payer: Medicare Other

## 2018-12-19 DIAGNOSIS — R0600 Dyspnea, unspecified: Secondary | ICD-10-CM

## 2018-12-19 LAB — CBC
HCT: 35.2 % — ABNORMAL LOW (ref 39.0–52.0)
Hemoglobin: 11.7 g/dL — ABNORMAL LOW (ref 13.0–17.0)
MCH: 30.7 pg (ref 26.0–34.0)
MCHC: 33.2 g/dL (ref 30.0–36.0)
MCV: 92.4 fL (ref 80.0–100.0)
Platelets: 221 10*3/uL (ref 150–400)
RBC: 3.81 MIL/uL — ABNORMAL LOW (ref 4.22–5.81)
RDW: 14.4 % (ref 11.5–15.5)
WBC: 21.3 10*3/uL — ABNORMAL HIGH (ref 4.0–10.5)
nRBC: 0 % (ref 0.0–0.2)

## 2018-12-19 LAB — BASIC METABOLIC PANEL
Anion gap: 9 (ref 5–15)
BUN: 8 mg/dL (ref 8–23)
CO2: 23 mmol/L (ref 22–32)
Calcium: 8.1 mg/dL — ABNORMAL LOW (ref 8.9–10.3)
Chloride: 106 mmol/L (ref 98–111)
Creatinine, Ser: 0.87 mg/dL (ref 0.61–1.24)
GFR calc Af Amer: >60 mL/min (ref >60)
GFR calc non Af Amer: >60 mL/min (ref >60)
Glucose, Bld: 132 mg/dL — ABNORMAL HIGH (ref 70–99)
Potassium: 3.3 mmol/L — ABNORMAL LOW (ref 3.5–5.1)
Sodium: 138 mmol/L (ref 135–145)

## 2018-12-19 NOTE — Progress Notes (Signed)
PROGRESS NOTE  Christopher Pena RUE:[id number] DOB: 01/01/1946 DOA: 12/17/2018 PCP: Sheliah Hatch, MD   LOS: 2 days   Brief Narrative / Interim history: 73 year old male with history of PAD with stent in place, hypertension, COPD not on chronic oxygen, chronic back pain, tobacco abuse in remission quit 3 years ago presented to the hospital on 12/17/2018 with cough and shortness of breath.  Patient reports feeling well at his baseline, ate some chicken the night before and felt like it got stuck in his throat and aspirated.  He started coughing and felt like they more more short of breath afterwards.  Then he developed a fever as well.  He presented to the hospital.  He was tested for Covid which was negative.  He was treated for sepsis, placed on ceftriaxone and azithromycin and was admitted to the hospital.  Subjective: -Feels better this morning, had an episode of coughing last night and felt like a "solid chunk" came out during his coughing spells.  He felt better immediately and has been feeling better since.  He denies any fever or chills.  He has not had severe cough overnight and almost no coughing this morning.  Assessment & Plan: Principal Problem:   Sepsis (HCC) Active Problems:   COPD II/III    HTN (hypertension)   Peripheral vascular disease, unspecified (HCC)   Acute on chronic respiratory failure with hypoxia (HCC)   Principal Problem Sepsis due to aspiration pneumonia/pneumonitis -Patient was placed on ceftriaxone and azithromycin, will switch to Unasyn and continue azithromycin for atypical coverage.  Underwent a CT angiogram which was negative for PE, however it did showright middle lobe with endobronchial debris, bronchial wall thickening, and tree-in-bud opacity of the right lower lobe. Given the degree of endobronchial debris and nodular changes, short-term follow-up with contrast-enhanced CT is recommended in 4 weeks-6 weeks once the patient has been treated to assure  resolution and the absence of an endobronchial lesion. -Continue IV fluids, IV antibiotics, white count increasing today but clinically he is improving -CT scan reviewed with Dr. Tonia Brooms with critical care, he does have a right bronchial lesion which may be tumor versus food, patient is clinically stable, reevaluate in the afternoon and much improved com -Patient had a coughing spell last night and per his report possible that some food debris came out, feeling better immediately afterwards. -Repeat chest x-ray this morning with worsening right lower infiltrate, x-ray personally reviewed.  Will repeat chest x-ray tomorrow.  If patient's respiratory status gets worse may need repeat CT scan versus pulmonology consult for bronchoscopy.  Continue pulmonary toilet, flutter valve, sit upright in chair during the day  Active Problems Acute hypoxic respiratory failure due to aspiration pneumonia, COPD exacerbation -Place patient on duo nebs, Pulmicort, continue prednisone along with antibiotics -Currently remains on 3 L nasal cannula, respiratory status is improved, will try to wean off as possible today -Patient describes a degree of chronic shortness of breath at home for a number of months and is wondering whether he will need oxygen at home, this is to be determined on the day of discharge.  Peripheral vascular disease -Continue Plavix, not taking statins anymore  Hypertension -Currently not on any medications, monitor  Chronic pain -Continue home oxycodone  Depression/anxiety -Continue Zoloft, BuSpar  Scheduled Meds:  budesonide (PULMICORT) nebulizer solution  0.25 mg Nebulization BID   busPIRone  7.5 mg Oral BID   clopidogrel  75 mg Oral Daily   docusate sodium  100 mg Oral BID  enoxaparin (LOVENOX) injection  40 mg Subcutaneous Q24H   ipratropium-albuterol  3 mL Nebulization TID   oxyCODONE  15 mg Oral Q4H   predniSONE  40 mg Oral Q breakfast   sertraline  25 mg Oral Daily    sodium chloride flush  10-40 mL Intracatheter Q12H   sodium chloride flush  3 mL Intravenous Q12H   Continuous Infusions:  sodium chloride 100 mL/hr at 12/19/18 1007   ampicillin-sulbactam (UNASYN) IV 3 g (12/19/18 1003)   azithromycin 500 mg (12/18/18 1759)   PRN Meds:.acetaminophen **OR** acetaminophen, albuterol, guaiFENesin-dextromethorphan, ondansetron **OR** ondansetron (ZOFRAN) IV, polyethylene glycol  DVT prophylaxis: Lovenox Code Status: DNR Family Communication: no family at bedside  Disposition Plan: home when ready   Consultants:   None   Procedures:   None   Antimicrobials:  Unasyn / Azithromycin 5/1 >>   Objective: Vitals:   12/19/18 0152 12/19/18 0351 12/19/18 0816 12/19/18 0818  BP: 104/60 117/62    Pulse: 76 75    Resp: 19 (!) 22    Temp:  98 F (36.7 C)    TempSrc:      SpO2: 97% 96% 99% 100%  Weight:  72.1 kg    Height:        Intake/Output Summary (Last 24 hours) at 12/19/2018 1014 Last data filed at 12/19/2018 0456 Gross per 24 hour  Intake 1891.49 ml  Output 1200 ml  Net 691.49 ml   Filed Weights   12/17/18 1325 12/18/18 0647 12/19/18 0351  Weight: 72.6 kg 72.9 kg 72.1 kg    Examination:  Constitutional: Much more comfortable this morning, watching TV Eyes: No scleral icterus ENMT: Moist membranes Respiratory: Faint end expiratory wheezing improved, no crackles.  Diffuse rhonchi on the right lung.  Good air movement Cardiovascular: Regular rate and rhythm, no murmurs appreciated.  No peripheral edema Abdomen: Soft, nontender, nondistended, positive bowel sounds Musculoskeletal: no clubbing / cyanosis. Skin: No rashes seen Neurologic: No focal deficits, equal strength Psychiatric: Normal judgment and insight. Alert and oriented x 3. Normal mood.    Data Reviewed: I have independently reviewed following labs and imaging studies   CBC: Recent Labs  Lab 12/17/18 1353 12/18/18 0610 12/19/18 0321  WBC 19.5* 16.6* 21.3*   NEUTROABS 17.6* 14.6*  --   HGB 15.4 13.1 11.7*  HCT 46.6 39.6 35.2*  MCV 91.0 90.8 92.4  PLT 302 246 221   Basic Metabolic Panel: Recent Labs  Lab 12/17/18 1353 12/18/18 0610 12/19/18 0321  NA 138 136 138  K 3.7 3.6 3.3*  CL 103 105 106  CO2 18* 22 23  GLUCOSE 181* 96 132*  BUN 11 7* 8  CREATININE 1.16 0.83 0.87  CALCIUM 9.2 8.1* 8.1*   GFR: Estimated Creatinine Clearance: 71.8 mL/min (by C-G formula based on SCr of 0.87 mg/dL). Liver Function Tests: Recent Labs  Lab 12/17/18 1353 12/18/18 0610  AST 37 27  ALT 26 20  ALKPHOS 77 59  BILITOT 0.7 1.1  PROT 8.1 6.3*  ALBUMIN 3.9 3.1*   No results for input(s): LIPASE, AMYLASE in the last 168 hours. No results for input(s): AMMONIA in the last 168 hours. Coagulation Profile: No results for input(s): INR, PROTIME in the last 168 hours. Cardiac Enzymes: Recent Labs  Lab 12/17/18 1353 12/17/18 1909 12/18/18 0211 12/18/18 0610  TROPONINI 0.03* 0.04* 0.05* 0.04*   BNP (last 3 results) No results for input(s): PROBNP in the last 8760 hours. HbA1C: Recent Labs    12/17/18 1909  HGBA1C 5.7*  CBG: No results for input(s): GLUCAP in the last 168 hours. Lipid Profile: Recent Labs    12/17/18 1353  TRIG 70   Thyroid Function Tests: No results for input(s): TSH, T4TOTAL, FREET4, T3FREE, THYROIDAB in the last 72 hours. Anemia Panel: Recent Labs    12/17/18 1353  FERRITIN 188   Urine analysis:    Component Value Date/Time   COLORURINE YELLOW 12/17/2018 1624   APPEARANCEUR CLEAR 12/17/2018 1624   LABSPEC 1.017 12/17/2018 1624   PHURINE 6.0 12/17/2018 1624   GLUCOSEU NEGATIVE 12/17/2018 1624   HGBUR NEGATIVE 12/17/2018 1624   BILIRUBINUR NEGATIVE 12/17/2018 1624   KETONESUR 5 (A) 12/17/2018 1624   PROTEINUR NEGATIVE 12/17/2018 1624   UROBILINOGEN 0.2 11/11/2013 1245   NITRITE NEGATIVE 12/17/2018 1624   LEUKOCYTESUR NEGATIVE 12/17/2018 1624   Sepsis Labs: Invalid input(s): PROCALCITONIN,  LACTICIDVEN  Recent Results (from the past 240 hour(s))  Blood culture (routine x 2)     Status: None (Preliminary result)   Collection Time: 12/17/18  1:53 PM  Result Value Ref Range Status   Specimen Description BLOOD LEFT FOREARM  Final   Special Requests   Final    BOTTLES DRAWN AEROBIC AND ANAEROBIC Blood Culture adequate volume   Culture   Final    NO GROWTH 2 DAYS Performed at [facility], [address] [place], [place] [address]    Report Status PENDING  Incomplete  Blood culture (routine x 2)     Status: None (Preliminary result)   Collection Time: 12/17/18  1:53 PM  Result Value Ref Range Status   Specimen Description BLOOD LEFT ANTECUBITAL  Final   Special Requests   Final    BOTTLES DRAWN AEROBIC AND ANAEROBIC Blood Culture adequate volume   Culture   Final    NO GROWTH 2 DAYS Performed at [facility], [address] [place], [place] [address]    Report Status PENDING  Incomplete  SARS Coronavirus 2 [facility] order, Performed in [facility] hospital lab)     Status: None   Collection Time: 12/17/18  1:53 PM  Result Value Ref Range Status   SARS Coronavirus 2 NEGATIVE NEGATIVE Final    Comment: (NOTE) If result is NEGATIVE SARS-CoV-2 target nucleic acids are NOT DETECTED. The SARS-CoV-2 RNA is generally detectable in upper and lower  respiratory specimens during the acute phase of infection. The lowest  concentration of SARS-CoV-2 viral copies this assay can detect is 250  copies / mL. A negative result does not preclude SARS-CoV-2 infection  and should not be used as the sole basis for treatment or other  patient management decisions.  A negative result may occur with  improper specimen collection / handling, submission of specimen other  than nasopharyngeal swab, presence of viral mutation(s) within the  areas targeted by this assay, and inadequate number of viral copies  (<250 copies / mL). A negative result must be combined with clinical   observations, patient history, and epidemiological information. If result is POSITIVE SARS-CoV-2 target nucleic acids are DETECTED. The SARS-CoV-2 RNA is generally detectable in upper and lower  respiratory specimens dur ing the acute phase of infection.  Positive  results are indicative of active infection with SARS-CoV-2.  Clinical  correlation with patient history and other diagnostic information is  necessary to determine patient infection status.  Positive results do  not rule out bacterial infection or co-infection with other viruses. If result is PRESUMPTIVE POSTIVE SARS-CoV-2 nucleic acids MAY BE PRESENT.  A presumptive positive result was obtained on the submitted specimen  and confirmed on repeat testing.  While 2019 novel coronavirus  (SARS-CoV-2) nucleic acids may be present in the submitted sample  additional confirmatory testing may be necessary for epidemiological  and / or clinical management purposes  to differentiate between  SARS-CoV-2 and other Sarbecovirus currently known to infect humans.  If clinically indicated additional testing with an alternate test  methodology [phone]) is advised. The SARS-CoV-2 RNA is generally  detectable in upper and lower respiratory sp ecimens during the acute  phase of infection. The expected result is Negative. Fact Sheet for Patients:  BoilerBrush.com.cy Fact Sheet for Healthcare Providers: https://pope.com/ This test is not yet approved or cleared by the [place] FDA and has been authorized for detection and/or diagnosis of SARS-CoV-2 by FDA under an Emergency Use Authorization (EUA).  This EUA will remain in effect (meaning this test can be used) for the duration of the COVID-19 declaration under Section 564(b)(1) of the Act, 21 U.S.C. section 360bbb-3(b)(1), unless the authorization is terminated or revoked sooner. Performed at [facility], [address],  [place], [place] [address]       Radiology Studies: Ct Angio Chest Pe W And/or Wo Contrast  Result Date: 12/17/2018 CLINICAL DATA:  73 year old male with shortness of breath EXAM: CT ANGIOGRAPHY CHEST WITH CONTRAST TECHNIQUE: Multidetector CT imaging of the chest was performed using the standard protocol during bolus administration of intravenous contrast. Multiplanar CT image reconstructions and MIPs were obtained to evaluate the vascular anatomy. CONTRAST:  54mL OMNIPAQUE IOHEXOL 350 MG/ML SOLN COMPARISON:  Chest x-ray 01/02/2016, 09/21/2016, 12/17/2018, 05/04/2013 FINDINGS: Cardiovascular: Heart: No cardiomegaly. No pericardial fluid/thickening. Minimal calcified atherosclerosis of the circumflex coronary artery. Aorta: Unremarkable course, caliber, contour of the thoracic aorta. No aneurysm or dissection flap. No periaortic fluid. Mild atherosclerosis. Branch vessels are patent. Pulmonary arteries: No central, lobar, segmental, or proximal subsegmental filling defects. Mediastinum/Nodes: No mediastinal adenopathy. Unremarkable appearance of the thoracic esophagus. Unremarkable thoracic inlet Lungs/Pleura: Pleuroparenchymal scarring at the apices. Centrilobular and paraseptal emphysema. There is apparent occlusion of the distal right mainstem bronchus and bronchus intermedius with endobronchial debris. Debris extends into the bronchi of the middle lobe and into the right lower lobe. Bronchial wall thickening present of the middle lobe and right lower lobe branches. Associated tree-in-bud pattern of opacity of the right lower lobe with no pleural effusion. There is 10 mm nodule at the periphery of the right lower lobe on image 66 of series 7. No pneumothorax Upper Abdomen: No acute. Musculoskeletal: No acute displaced fracture. Degenerative changes of the spine. Review of the MIP images confirms the above findings. IMPRESSION: CT negative for pulmonary emboli. Bronchitis/bronchiolitis of the right lower lobe  and to a lesser degree the right middle lobe with endobronchial debris, bronchial wall thickening, and tree-in-bud opacity of the right lower lobe. Given the degree of endobronchial debris and nodular changes, short-term follow-up with contrast-enhanced CT is recommended in 4 weeks-6 weeks once the patient has been treated to assure resolution and the absence of an endobronchial lesion. Alternatively, referral for pulmonary evaluation and bronchoscopy may be considered. 1 cm nodule at the periphery of the right lower lobe. This appears to have been present dating back to chest x-ray 01/02/2016, most likely benign hamartoma or granuloma. Aortic Atherosclerosis (ICD10-I70.0). Associated coronary artery disease Electronically Signed   By: Gilmer Mor D.O.   On: 12/17/2018 17:54   Dg Chest Port 1 View  Result Date: 12/19/2018 CLINICAL  DATA:  Dyspnea EXAM: PORTABLE CHEST 1 VIEW COMPARISON:  12/17/2018 chest radiograph. FINDINGS: Stable cardiomediastinal silhouette with normal heart size. No pneumothorax. No pleural effusion. Emphysema. New hazy opacity throughout the right lower lung. No pulmonary edema. IMPRESSION: New hazy opacity throughout the right lower lung suggesting pneumonia. Emphysema. Chest radiograph follow-up advised. Electronically Signed   By: Delbert Phenix M.D.   On: 12/19/2018 09:06   Dg Chest Port 1 View  Result Date: 12/17/2018 CLINICAL DATA:  73 year old male with shortness of breath EXAM: PORTABLE CHEST 1 VIEW COMPARISON:  Prior chest x-ray 09/21/2016 and 01/02/2016 FINDINGS: Cardiac and mediastinal contours remain within normal limits. Atherosclerotic calcifications again noted in the transverse aorta. The lungs remain slightly hyperinflated with chronic background bronchitic changes. Mild biapical pleuroparenchymal scarring. No evidence of pulmonary edema, pleural effusion, pneumothorax or focal airspace consolidation. Calcified granuloma overlying the periphery of the right lower lobe  appears similar dating back to 2017. IMPRESSION: Stable chest x-ray without evidence of acute cardiopulmonary process. Background pulmonary parenchymal changes consistent with COPD. Aortic Atherosclerosis (ICD10-170.0) Electronically Signed   By: Malachy Moan M.D.   On: 12/17/2018 14:36    Pamella Pert, MD, PhD [facility]  Contact via  [email or web address]  [facility] Office Info P: [phone]  F: [phone]

## 2018-12-20 ENCOUNTER — Encounter (HOSPITAL_COMMUNITY): Payer: Self-pay

## 2018-12-20 MED ORDER — ALUM & MAG HYDROXIDE-SIMETH 200-200-20 MG/5ML PO SUSP
30.0000 mL | Freq: Four times a day (QID) | ORAL | Status: DC | PRN
  Administered 2018-12-20: 30 mL via ORAL
  Filled 2018-12-20 (×2): qty 30

## 2018-12-20 MED ORDER — POTASSIUM CHLORIDE CRYS ER 20 MEQ PO TBCR
40.0000 meq | EXTENDED_RELEASE_TABLET | ORAL | Status: AC
  Administered 2018-12-20 (×2): 40 meq via ORAL
  Filled 2018-12-20 (×2): qty 2

## 2018-12-20 NOTE — Progress Notes (Addendum)
PROGRESS NOTE    Christopher BalesDavid H Pena  ZOX:[id number] DOB: 12-Jun-1946 DOA: 12/17/2018 PCP: Sheliah Hatchabori, Katherine E, MD    Brief Narrative:  73 year old male who presented with fever and dyspnea.  He does have significant past medical history for peripheral artery disease status post stenting, hypertension, COPD and chronic back pain.  Reported 24-hour history of dyspnea and fever.  His symptoms occur after him noticing difficulty swallowing and possible aspiration.  Initial physical examination his blood pressure is 134/71, heart rate 103, respiratory rate 25, temperature 98.4, oxygen saturation 95%.  In mild respiratory distress, heart S1-S2 present and rhythmic, tachycardic, lungs with no significant wheezing, rales or rhonchi, abdomen soft nontender, no lower extremity edema.  Sodium 138, potassium 3.7, chloride 1 3, bicarb 18, glucose 181, BUN 11, creatinine 1.16, white count 19.5, hemoglobin 15.4, hematocrit 46.6, platelets 320.  His initial chest x-ray had hyperinflation, bibasilar atelectasis.  CT chest negative for pulmonary embolism, no significant infiltrates, bronchitis/bronchiolitis with a right lower lobe and lesser degree right middle lobe.  EKG 123 bpm, normal axis, normal intervals, sinus rhythm, no significant ST segment or T wave changes.   Patient was admitted to the hospital with a working diagnosis of sepsis due to right lower lobe aspiration pneumonia.   Assessment & Plan:   Principal Problem:   Sepsis (HCC) Active Problems:   COPD II/III    HTN (hypertension)   Peripheral vascular disease, unspecified (HCC)   Acute on chronic respiratory failure with hypoxia (HCC)   1. Sepsis due to right lower lobe aspiration pneumonia. Patient continue to have dyspnea, oxymetry is 97% on 1,5 L per [place]. Will continue antibiotic therapy with Unasyn #2/8. Continue bronchodilator and aspiration precautions. His cultures have been no growth and he has remain afebrile. Worsening leukocytosis,  possible steroid induced. Discontinue IV fluids.  2. COPD exacerbation. Patient with no significant wheezing on lung examination today. Will hold on systemic steroids, continue bronchodilator therapy with albuterol and inhaled corticosteroids.   3. Depression. Continue sertraline and buspirone.   4. Peripheral vascular disease. Continue clopidogrel.   DVT prophylaxis: enoxaparin   Code Status: full Family Communication: no family at the bedside  Disposition Plan/ discharge barriers: pending clinical improvement   Body mass index is 24.9 kg/m. Malnutrition Type:      Malnutrition Characteristics:      Nutrition Interventions:d     RN Pressure Injury Documentation:     Consultants:     Procedures:     Antimicrobials:   Unasyn IV     Subjective: Patient not feeling very well this am, generalized weakness, no bowel movement. Persistent dyspnea, no chest pain, no nausea or vomiting.   Objective: Vitals:   12/20/18 0152 12/20/18 0352 12/20/18 0729 12/20/18 0732  BP: 124/72 138/70    Pulse: 71 70    Resp: 15 20    Temp:  98 F (36.7 C)    TempSrc:      SpO2: 95% 97% 96% 97%  Weight:      Height:        Intake/Output Summary (Last 24 hours) at 12/20/2018 0852 Last data filed at 12/20/2018 0350 Gross per 24 hour  Intake 180 ml  Output 400 ml  Net -220 ml   Filed Weights   12/17/18 1325 12/18/18 0647 12/19/18 0351  Weight: 72.6 kg 72.9 kg 72.1 kg    Examination:   General: deconditioned  Neurology: Awake and alert, non focal  E ENT: mild pallor, no icterus, oral mucosa moist Cardiovascular:  No JVD. S1-S2 present, rhythmic, no gallops, rubs, or murmurs. No lower extremity edema. Pulmonary: positive breath sounds bilaterally, no wheezing or rhonchi, positive right base rales. Gastrointestinal. Abdomen with no organomegaly, non tender, no rebound or guarding Skin. No rashes Musculoskeletal: no joint deformities     Data Reviewed: I have  personally reviewed following labs and imaging studies  CBC: Recent Labs  Lab 12/17/18 1353 12/18/18 0610 12/19/18 0321  WBC 19.5* 16.6* 21.3*  NEUTROABS 17.6* 14.6*  --   HGB 15.4 13.1 11.7*  HCT 46.6 39.6 35.2*  MCV 91.0 90.8 92.4  PLT 302 246 221   Basic Metabolic Panel: Recent Labs  Lab 12/17/18 1353 12/18/18 0610 12/19/18 0321  NA 138 136 138  K 3.7 3.6 3.3*  CL 103 105 106  CO2 18* 22 23  GLUCOSE 181* 96 132*  BUN 11 7* 8  CREATININE 1.16 0.83 0.87  CALCIUM 9.2 8.1* 8.1*   GFR: Estimated Creatinine Clearance: 71.8 mL/min (by C-G formula based on SCr of 0.87 mg/dL). Liver Function Tests: Recent Labs  Lab 12/17/18 1353 12/18/18 0610  AST 37 27  ALT 26 20  ALKPHOS 77 59  BILITOT 0.7 1.1  PROT 8.1 6.3*  ALBUMIN 3.9 3.1*   No results for input(s): LIPASE, AMYLASE in the last 168 hours. No results for input(s): AMMONIA in the last 168 hours. Coagulation Profile: No results for input(s): INR, PROTIME in the last 168 hours. Cardiac Enzymes: Recent Labs  Lab 12/17/18 1353 12/17/18 1909 12/18/18 0211 12/18/18 0610  TROPONINI 0.03* 0.04* 0.05* 0.04*   BNP (last 3 results) No results for input(s): PROBNP in the last 8760 hours. HbA1C: Recent Labs    12/17/18 1909  HGBA1C 5.7*   CBG: No results for input(s): GLUCAP in the last 168 hours. Lipid Profile: Recent Labs    12/17/18 1353  TRIG 70   Thyroid Function Tests: No results for input(s): TSH, T4TOTAL, FREET4, T3FREE, THYROIDAB in the last 72 hours. Anemia Panel: Recent Labs    12/17/18 1353  FERRITIN 188      Radiology Studies: I have reviewed all of the imaging during this hospital visit personally     Scheduled Meds: . budesonide (PULMICORT) nebulizer solution  0.25 mg Nebulization BID  . busPIRone  7.5 mg Oral BID  . clopidogrel  75 mg Oral Daily  . docusate sodium  100 mg Oral BID  . enoxaparin (LOVENOX) injection  40 mg Subcutaneous Q24H  . ipratropium-albuterol  3 mL  Nebulization TID  . oxyCODONE  15 mg Oral Q4H  . predniSONE  40 mg Oral Q breakfast  . sertraline  25 mg Oral Daily  . sodium chloride flush  10-40 mL Intracatheter Q12H  . sodium chloride flush  3 mL Intravenous Q12H   Continuous Infusions: . sodium chloride 100 mL/hr at 12/19/18 2320  . ampicillin-sulbactam (UNASYN) IV 3 g (12/20/18 0350)  . azithromycin 500 mg (12/19/18 1813)     LOS: 3 days        Tan Clopper Annett Gula, MD

## 2018-12-21 LAB — BASIC METABOLIC PANEL
Anion gap: 9 (ref 5–15)
BUN: 12 mg/dL (ref 8–23)
CO2: 22 mmol/L (ref 22–32)
Calcium: 8 mg/dL — ABNORMAL LOW (ref 8.9–10.3)
Chloride: 108 mmol/L (ref 98–111)
Creatinine, Ser: 0.87 mg/dL (ref 0.61–1.24)
GFR calc Af Amer: >60 mL/min (ref >60)
GFR calc non Af Amer: >60 mL/min (ref >60)
Glucose, Bld: 87 mg/dL (ref 70–99)
Potassium: 3.6 mmol/L (ref 3.5–5.1)
Sodium: 139 mmol/L (ref 135–145)

## 2018-12-21 LAB — CBC WITH DIFFERENTIAL/PLATELET
Abs Immature Granulocytes: 0.22 10*3/uL — ABNORMAL HIGH (ref 0.00–0.07)
Basophils Absolute: 0 10*3/uL (ref 0.0–0.1)
Basophils Relative: 0 %
Eosinophils Absolute: 0 10*3/uL (ref 0.0–0.5)
Eosinophils Relative: 0 %
HCT: 36 % — ABNORMAL LOW (ref 39.0–52.0)
Hemoglobin: 11.8 g/dL — ABNORMAL LOW (ref 13.0–17.0)
Immature Granulocytes: 2 %
Lymphocytes Relative: 4 %
Lymphs Abs: 0.6 10*3/uL — ABNORMAL LOW (ref 0.7–4.0)
MCH: 30.2 pg (ref 26.0–34.0)
MCHC: 32.8 g/dL (ref 30.0–36.0)
MCV: 92.1 fL (ref 80.0–100.0)
Monocytes Absolute: 1.1 10*3/uL — ABNORMAL HIGH (ref 0.1–1.0)
Monocytes Relative: 8 %
Neutro Abs: 12 10*3/uL — ABNORMAL HIGH (ref 1.7–7.7)
Neutrophils Relative %: 86 %
Platelets: 288 10*3/uL (ref 150–400)
RBC: 3.91 MIL/uL — ABNORMAL LOW (ref 4.22–5.81)
RDW: 14.6 % (ref 11.5–15.5)
WBC: 13.9 10*3/uL — ABNORMAL HIGH (ref 4.0–10.5)
nRBC: 0 % (ref 0.0–0.2)

## 2018-12-21 MED ORDER — POLYETHYLENE GLYCOL 3350 17 G PO PACK
17.0000 g | PACK | Freq: Two times a day (BID) | ORAL | Status: DC
  Administered 2018-12-22 – 2018-12-24 (×3): 17 g via ORAL
  Filled 2018-12-21 (×4): qty 1

## 2018-12-21 MED ORDER — POTASSIUM CHLORIDE CRYS ER 20 MEQ PO TBCR
40.0000 meq | EXTENDED_RELEASE_TABLET | Freq: Once | ORAL | Status: AC
  Administered 2018-12-21: 40 meq via ORAL
  Filled 2018-12-21: qty 2

## 2018-12-21 MED ORDER — OXYCODONE HCL 5 MG PO TABS
15.0000 mg | ORAL_TABLET | ORAL | Status: DC | PRN
  Administered 2018-12-21 – 2018-12-24 (×13): 15 mg via ORAL
  Filled 2018-12-21 (×13): qty 3

## 2018-12-21 MED ORDER — BISACODYL 5 MG PO TBEC: 5.0000 mg | DELAYED_RELEASE_TABLET | Freq: Once | ORAL | Status: DC

## 2018-12-21 NOTE — Progress Notes (Signed)
  Speech Language Pathology Treatment: Dysphagia  Patient Details Name: Christopher Pena MRN: [id number] DOB: 08-11-1946 Today's Date: 12/21/2018 Time: 1105-1130 SLP Time Calculation (min) (ACUTE ONLY): 25 min  Assessment / Plan / Recommendation Clinical Impression  Patient seen to address dysphagia goals with current diet of Regular solids and thin liquids. Patient declined regular solids at this time as his stomach has been feeling upset (nausea). He consumed thin liquids and puree solids without overt s/s of aspiration or penetration and without difficulty per patient self-report. Initial SLP order was secondary to patient c/o of his throat closing up some when he was eating, leading to small amounts of solid foods getting stuck in pharynx, which he would then throat clear and reswallow. Patient reported today that he has not had any difficulty with regular solids as he had upon admission and that he was able to swallow pills "better than when I was at home". SLP educated patient regarding energy conservation when eating secondary to COPD with SpO2 via nasal cannula(patient was not on oxygen at home but stated that plan was for him to start home oxygen upon discharge). Patient does not have any h/o GERD or esophageal swallowing issues in chart and SLP suspects his difficulties with solids were related to his chronic dry mouth and COPD.    HPI HPI: Pt is a 73 yo male who presented with PNA. Symptoms began after coughing episode while eating chicken alfredo. CT showed bronchitits of the RLL with tree-in-bud opacity of the RLL and endobronchial debris noted. COVID testing negative. PMH includes anxiety, chronic back pain, COPD, HTN, unexplained weight loss      SLP Plan  Discharge SLP treatment due to (comment)(goals met, education completed)       Recommendations  Diet recommendations: Regular;Thin liquid Liquids provided via: Straw;Cup Medication Administration: Whole meds with  liquid Supervision: Patient able to self feed Compensations: Minimize environmental distractions;Slow rate;Small sips/bites;Follow solids with liquid Postural Changes and/or Swallow Maneuvers: Seated upright 90 degrees                Oral Care Recommendations: Oral care BID Follow up Recommendations: None SLP Visit Diagnosis: Dysphagia, unspecified (R13.10) Plan: Discharge SLP treatment due to (comment)(goals met, education completed)       GO                [name] 12/21/2018, 11:58 AM    Sonia Baller, MA, CCC-SLP Speech Therapy [facility] Acute Rehab Pager: [phone]

## 2018-12-21 NOTE — Progress Notes (Signed)
PROGRESS NOTE    Christopher BalesDavid H Pena  ZOX:[id number] DOB: [birth year] DOA: 12/17/2018 PCP: Sheliah Hatchabori, Katherine E, MD    Brief Narrative:  73 year old male who presented with fever and dyspnea.  He does have significant past medical history for peripheral artery disease status post stenting, hypertension, COPD and chronic back pain.  Reported 24-hour history of dyspnea and fever.  His symptoms occur after him noticing difficulty swallowing and possible aspiration.  Initial physical examination his blood pressure is 134/71, heart rate 103, respiratory rate 25, temperature 98.4, oxygen saturation 95%.  In mild respiratory distress, heart S1-S2 present and rhythmic, tachycardic, lungs with no significant wheezing, rales or rhonchi, abdomen soft nontender, no lower extremity edema.  Sodium 138, potassium 3.7, chloride 1 3, bicarb 18, glucose 181, BUN 11, creatinine 1.16, white count 19.5, hemoglobin 15.4, hematocrit 46.6, platelets 320.  His initial chest x-ray had hyperinflation, bibasilar atelectasis.  CT chest negative for pulmonary embolism, no significant infiltrates, bronchitis/bronchiolitis with a right lower lobe and lesser degree right middle lobe.  EKG 123 bpm, normal axis, normal intervals, sinus rhythm, no significant ST segment or T wave changes.   Patient was admitted to the hospital with a working diagnosis of sepsis due to right lower lobe aspiration pneumonia.    Assessment & Plan:   Principal Problem:   Sepsis (HCC) Active Problems:   COPD II/III    HTN (hypertension)   Peripheral vascular disease, unspecified (HCC)   Acute on chronic respiratory failure with hypoxia (HCC)  1. Sepsis due to right lower lobe aspiration pneumonia (present on admission) complicated with acute hypoxic respiratory failure. Patient continue to be hypoxic, at rest on room air oxygen saturation 84% and on ambulation at 80%. Improved to  91% on 4 LPm per [place].  Antibiotic therapy with Unasyn #3/8. Improved  leukocytosis at 13.9 from 21,3. Continue oxymetry monitoring and supplemental 02 per , target 02 sat above 92%.   Out of bed to chair, and ambulation with physical therapy. Aspiration precautions.   2. COPD exacerbation. Continue with bronchodilator and inhaled corticosteroids. Systemic steroids have been discontinued.   3. Depression. On sertraline and buspirone, no confusion or agitation.   4. Peripheral vascular disease. On clopidogrel.   5. Hypokalemia. Continue K correction with Kcl, K today at 3,6, renal function is preserved with serum cr at 0,87.   DVT prophylaxis: enoxaparin   Code Status: full Family Communication: no family at the bedside  Disposition Plan/ discharge barriers: pending clinical improvement    Body mass index is 24.9 kg/m. Malnutrition Type:      Malnutrition Characteristics:      Nutrition Interventions:     RN Pressure Injury Documentation:     Consultants:     Procedures:     Antimicrobials:   Unasyn IV    Subjective: Patient continue to improved but not yet back to baseline, this am with nausea and abdominal distention, continue to have dyspnea on exertion and apparently had episode of desaturation on room air.   Objective: Vitals:   12/20/18 2352 12/20/18 2355 12/21/18 0421 12/21/18 0806  BP: (!) 119/100 121/72 (!) 129/98 138/78  Pulse: 74  74 76  Resp: 17  18 19   Temp: 98.2 F (36.8 C)  97.8 F (36.6 C) 98.5 F (36.9 C)  TempSrc: Oral  Oral Oral  SpO2: 95%  96% 92%  Weight:      Height:        Intake/Output Summary (Last 24 hours) at 12/21/2018 [id number] Last data  filed at 12/21/2018 0424 Gross per 24 hour  Intake -  Output 1250 ml  Net -1250 ml   Filed Weights   12/17/18 1325 12/18/18 0647 12/19/18 0351  Weight: 72.6 kg 72.9 kg 72.1 kg    Examination:   General: deconditioned  Neurology: Awake and alert, non focal  E ENT: mild pallor, no icterus, oral mucosa moist Cardiovascular: No JVD. S1-S2  present, rhythmic, no gallops, rubs, or murmurs. No lower extremity edema. Pulmonary: positive breath sounds bilaterally, adequate air movement, no wheezing, or rhonchi, positive right base rales. Gastrointestinal. Abdomen distended and tympanic no organomegaly, non tender, no rebound or guarding Skin. No rashes Musculoskeletal: no joint deformities     Data Reviewed: I have personally reviewed following labs and imaging studies  CBC: Recent Labs  Lab 12/17/18 1353 12/18/18 0610 12/19/18 0321 12/21/18 0515  WBC 19.5* 16.6* 21.3* 13.9*  NEUTROABS 17.6* 14.6*  --  12.0*  HGB 15.4 13.1 11.7* 11.8*  HCT 46.6 39.6 35.2* 36.0*  MCV 91.0 90.8 92.4 92.1  PLT 302 246 221 288   Basic Metabolic Panel: Recent Labs  Lab 12/17/18 1353 12/18/18 0610 12/19/18 0321 12/21/18 0515  NA 138 136 138 139  K 3.7 3.6 3.3* 3.6  CL 103 105 106 108  CO2 18* 22 23 22   GLUCOSE 181* 96 132* 87  BUN 11 7* 8 12  CREATININE 1.16 0.83 0.87 0.87  CALCIUM 9.2 8.1* 8.1* 8.0*   GFR: Estimated Creatinine Clearance: 71.8 mL/min (by C-G formula based on SCr of 0.87 mg/dL). Liver Function Tests: Recent Labs  Lab 12/17/18 1353 12/18/18 0610  AST 37 27  ALT 26 20  ALKPHOS 77 59  BILITOT 0.7 1.1  PROT 8.1 6.3*  ALBUMIN 3.9 3.1*   No results for input(s): LIPASE, AMYLASE in the last 168 hours. No results for input(s): AMMONIA in the last 168 hours. Coagulation Profile: No results for input(s): INR, PROTIME in the last 168 hours. Cardiac Enzymes: Recent Labs  Lab 12/17/18 1353 12/17/18 1909 12/18/18 0211 12/18/18 0610  TROPONINI 0.03* 0.04* 0.05* 0.04*   BNP (last 3 results) No results for input(s): PROBNP in the last 8760 hours. HbA1C: No results for input(s): HGBA1C in the last 72 hours. CBG: No results for input(s): GLUCAP in the last 168 hours. Lipid Profile: No results for input(s): CHOL, HDL, LDLCALC, TRIG, CHOLHDL, LDLDIRECT in the last 72 hours. Thyroid Function Tests: No results  for input(s): TSH, T4TOTAL, FREET4, T3FREE, THYROIDAB in the last 72 hours. Anemia Panel: No results for input(s): VITAMINB12, FOLATE, FERRITIN, TIBC, IRON, RETICCTPCT in the last 72 hours.    Radiology Studies: I have reviewed all of the imaging during this hospital visit personally     Scheduled Meds: . budesonide (PULMICORT) nebulizer solution  0.25 mg Nebulization BID  . busPIRone  7.5 mg Oral BID  . clopidogrel  75 mg Oral Daily  . docusate sodium  100 mg Oral BID  . enoxaparin (LOVENOX) injection  40 mg Subcutaneous Q24H  . oxyCODONE  15 mg Oral Q4H  . sertraline  25 mg Oral Daily  . sodium chloride flush  10-40 mL Intracatheter Q12H  . sodium chloride flush  3 mL Intravenous Q12H   Continuous Infusions: . ampicillin-sulbactam (UNASYN) IV 3 g (12/21/18 0156)     LOS: 4 days        Aryka Coonradt Annett Gula, MD

## 2018-12-21 NOTE — Evaluation (Signed)
Physical Therapy Evaluation Patient Details Name: Christopher Pena MRN: [id number] DOB: Apr 16, 1946 Today's Date: 12/21/2018   History of Present Illness  73 year old male who presented with fever and dyspnea.  He does have significant past medical history for peripheral artery disease status post stenting, hypertension, COPD and chronic back pain.  Reported 24-hour history of dyspnea and fever. Pt dx with RLL PNA.  Clinical Impression  Pt admitted with above diagnosis. Pt currently with functional limitations due to the deficits listed below (see PT Problem List). Pt SOB with all activity, could not maintain SpO2 in 90's on 2L O2, needed up to 4L for ambulation. Ambulation limited today by diarrhea. Recommend rollator for pt for energy conservation and HHPT.  Pt will benefit from skilled PT to increase their independence and safety with mobility to allow discharge to the venue listed below.       Follow Up Recommendations Home health PT    Equipment Recommendations  Other (comment)(rollator with seat for energy conservation)    Recommendations for Other Services OT consult     Precautions / Restrictions Precautions Precautions: Fall Restrictions Weight Bearing Restrictions: No      Mobility  Bed Mobility Overal bed mobility: Modified Independent                Transfers Overall transfer level: Needs assistance Equipment used: Rolling walker (2 wheeled) Transfers: Sit to/from Stand Sit to Stand: Supervision         General transfer comment: increased time needed, supervision for safety  Ambulation/Gait Ambulation/Gait assistance: Min guard Gait Distance (Feet): 20 Feet Assistive device: Rolling walker (2 wheeled) Gait Pattern/deviations: Step-through pattern;Decreased stride length Gait velocity: decreased Gait velocity interpretation: <1.31 ft/sec, indicative of household ambulator General Gait Details: pt began to have diarrhea with standing, ambulated to  bathroom. Afterwards, back to chair far side of room. SpO2 dropped to low 80's on 2L, increased to 3L, still SPO2 mid 80's, into 90's on 4L  Stairs            Wheelchair Mobility    Modified Rankin (Stroke Patients Only)       Balance Overall balance assessment: Mild deficits observed, not formally tested                                           Pertinent Vitals/Pain Pain Assessment: Faces Faces Pain Scale: Hurts even more Pain Location: abdomen Pain Descriptors / Indicators: Aching;Cramping Pain Intervention(s): Limited activity within patient's tolerance;Monitored during session    Home Living Family/patient expects to be discharged to:: Private residence Living Arrangements: Alone Available Help at Discharge: Family;Available PRN/intermittently Type of Home: Apartment Home Access: Level entry     Home Layout: One level Home Equipment: Hand held shower head;Grab bars - toilet;Grab bars - tub/shower;Walker - 2 wheels;Cane - single point Additional Comments: pt has 2 daughter very involved but has not seen them during covid and is hesitant    Prior Function Level of Independence: Independent         Comments: independent but was getting SOB even before this episode. Reports that he has only been sponge bathing at home due to SOB     Hand Dominance   Dominant Hand: Right    Extremity/Trunk Assessment   Upper Extremity Assessment Upper Extremity Assessment: Defer to OT evaluation    Lower Extremity Assessment Lower Extremity Assessment: Generalized weakness  Cervical / Trunk Assessment Cervical / Trunk Assessment: Normal  Communication   Communication: No difficulties  Cognition Arousal/Alertness: Awake/alert Behavior During Therapy: WFL for tasks assessed/performed Overall Cognitive Status: Within Functional Limits for tasks assessed                                        General Comments      Exercises      Assessment/Plan    PT Assessment Patient needs continued PT services  PT Problem List Decreased strength;Decreased balance;Decreased activity tolerance;Decreased mobility;Decreased knowledge of use of DME;Decreased knowledge of precautions;Pain;Cardiopulmonary status limiting activity       PT Treatment Interventions DME instruction;Gait training;Functional mobility training;Therapeutic activities;Therapeutic exercise;Balance training;Neuromuscular re-education;Patient/family education    PT Goals (Current goals can be found in the Care Plan section)  Acute Rehab PT Goals Patient Stated Goal: return home PT Goal Formulation: With patient Time For Goal Achievement: 01/04/19 Potential to Achieve Goals: Good    Frequency Min 3X/week   Barriers to discharge Decreased caregiver support lives alone, discussed that he may need to have daughters over to assist at first    Co-evaluation               AM-PAC PT "6 Clicks" Mobility  Outcome Measure Help needed turning from your back to your side while in a flat bed without using bedrails?: None Help needed moving from lying on your back to sitting on the side of a flat bed without using bedrails?: None Help needed moving to and from a bed to a chair (including a wheelchair)?: A Little Help needed standing up from a chair using your arms (e.g., wheelchair or bedside chair)?: A Little Help needed to walk in hospital room?: A Little Help needed climbing 3-5 steps with a railing? : A Lot 6 Click Score: 19    End of Session Equipment Utilized During Treatment: Gait belt;Oxygen Activity Tolerance: Patient tolerated treatment well;Other (comment)(ambulation limited by diarrhea) Patient left: in chair;with call bell/phone within reach Nurse Communication: Mobility status PT Visit Diagnosis: Muscle weakness (generalized) (M62.81);Difficulty in walking, not elsewhere classified (R26.2);Pain    Time: 0915-1006 PT Time Calculation (min)  (ACUTE ONLY): 51 min   Charges:   PT Evaluation $PT Eval Moderate Complexity: 1 Mod PT Treatments $Gait Training: 8-22 mins $Therapeutic Activity: 8-22 mins        Lyanne Co, PT  Acute Rehab Services  Pager [phone] Office [phone]   Christopher Pena 12/21/2018, 11:12 AM

## 2018-12-21 NOTE — Progress Notes (Signed)
Additional Physical Therapy Note  SATURATION QUALIFICATIONS: (This note is used to comply with regulatory documentation for home oxygen  *Note: pt unable to be tested on RA as he was desating significantly on supplemental oxygen.  Patient Saturations on Room Air at Rest = 84%  Patient Saturations on 2L O2 while Ambulating = 80%  Patient Saturations on 4 Liters of oxygen while Ambulating = 91%  Please briefly explain why patient needs home oxygen: Pt with SpO2 low 80's at rest on RA, low 80's with mobility on 2L O2. SpO2 increase to low 90's on 4L O2. Pt symptomatic with O2 drop.   Lyanne Co, PT  Acute Rehab Services  Pager [phone] Office [phone]

## 2018-12-22 DIAGNOSIS — E876 Hypokalemia: Secondary | ICD-10-CM

## 2018-12-22 LAB — CULTURE, BLOOD (ROUTINE X 2)
Culture: NO GROWTH
Culture: NO GROWTH
Special Requests: ADEQUATE
Special Requests: ADEQUATE

## 2018-12-22 LAB — BASIC METABOLIC PANEL
Anion gap: 12 (ref 5–15)
BUN: 9 mg/dL (ref 8–23)
CO2: 25 mmol/L (ref 22–32)
Calcium: 8 mg/dL — ABNORMAL LOW (ref 8.9–10.3)
Chloride: 101 mmol/L (ref 98–111)
Creatinine, Ser: 0.77 mg/dL (ref 0.61–1.24)
GFR calc Af Amer: >60 mL/min (ref >60)
GFR calc non Af Amer: >60 mL/min (ref >60)
Glucose, Bld: 98 mg/dL (ref 70–99)
Potassium: 3.1 mmol/L — ABNORMAL LOW (ref 3.5–5.1)
Sodium: 138 mmol/L (ref 135–145)

## 2018-12-22 LAB — CBC WITH DIFFERENTIAL/PLATELET
Abs Immature Granulocytes: 0.3 10*3/uL — ABNORMAL HIGH (ref 0.00–0.07)
Basophils Absolute: 0.1 10*3/uL (ref 0.0–0.1)
Basophils Relative: 0 %
Eosinophils Absolute: 0.2 10*3/uL (ref 0.0–0.5)
Eosinophils Relative: 2 %
HCT: 35.6 % — ABNORMAL LOW (ref 39.0–52.0)
Hemoglobin: 11.9 g/dL — ABNORMAL LOW (ref 13.0–17.0)
Immature Granulocytes: 3 %
Lymphocytes Relative: 7 %
Lymphs Abs: 0.8 10*3/uL (ref 0.7–4.0)
MCH: 30.1 pg (ref 26.0–34.0)
MCHC: 33.4 g/dL (ref 30.0–36.0)
MCV: 89.9 fL (ref 80.0–100.0)
Monocytes Absolute: 1.3 10*3/uL — ABNORMAL HIGH (ref 0.1–1.0)
Monocytes Relative: 11 %
Neutro Abs: 8.7 10*3/uL — ABNORMAL HIGH (ref 1.7–7.7)
Neutrophils Relative %: 77 %
Platelets: 293 10*3/uL (ref 150–400)
RBC: 3.96 MIL/uL — ABNORMAL LOW (ref 4.22–5.81)
RDW: 14.2 % (ref 11.5–15.5)
WBC: 11.3 10*3/uL — ABNORMAL HIGH (ref 4.0–10.5)
nRBC: 0 % (ref 0.0–0.2)

## 2018-12-22 MED ORDER — AMOXICILLIN-POT CLAVULANATE 875-125 MG PO TABS
1.0000 | ORAL_TABLET | Freq: Two times a day (BID) | ORAL | Status: DC
  Administered 2018-12-22 – 2018-12-23 (×3): 1 via ORAL
  Filled 2018-12-22 (×3): qty 1

## 2018-12-22 MED ORDER — POTASSIUM CHLORIDE CRYS ER 20 MEQ PO TBCR
40.0000 meq | EXTENDED_RELEASE_TABLET | ORAL | Status: AC
  Administered 2018-12-22 (×2): 40 meq via ORAL
  Filled 2018-12-22 (×2): qty 2

## 2018-12-22 NOTE — Progress Notes (Signed)
Physical Therapy Treatment Patient Details Name: Christopher Pena MRN: [id number] DOB: Mar 01, 1946 Today's Date: 12/22/2018    History of Present Illness 73 year old male who presented with fever and dyspnea.  He does have significant past medical history for peripheral artery disease status post stenting, hypertension, COPD and chronic back pain.  Reported 24-hour history of dyspnea and fever. Pt dx with RLL PNA.    PT Comments    Patient seen for mobility progression. Pt presents with generalized weakness and decreased activity tolerance. Pt tolerated ambulating ~30 ft total during session with 3-4L O2 via [place] required to maintain SpO2 90%. Recommend CIR for further skilled PT services to maximize independence and safety with mobility.    Follow Up Recommendations  CIR     Equipment Recommendations  Other (comment)(TBD pending progress)    Recommendations for Other Services       Precautions / Restrictions Precautions Precautions: Fall Restrictions Weight Bearing Restrictions: No    Mobility  Bed Mobility Overal bed mobility: Modified Independent             General bed mobility comments: use of rails   Transfers Overall transfer level: Needs assistance Equipment used: None Transfers: Sit to/from Stand Sit to Stand: Min assist         General transfer comment: assist to steady without use of AD  Ambulation/Gait Ambulation/Gait assistance: Min guard;Min assist Gait Distance (Feet): (~30 ft total ) Assistive device: Rolling walker (2 wheeled);1 person hand held assist Gait Pattern/deviations: Step-through pattern;Decreased stride length;Trunk flexed Gait velocity: decreased   General Gait Details: min A for gait without AD and min guard for use of RW; pt is unsteady and becomes very SOB with mobility   Stairs             Wheelchair Mobility    Modified Rankin (Stroke Patients Only)       Balance Overall balance assessment: Mild deficits  observed, not formally tested                                          Cognition Arousal/Alertness: Awake/alert Behavior During Therapy: WFL for tasks assessed/performed Overall Cognitive Status: Within Functional Limits for tasks assessed                                        Exercises      General Comments General comments (skin integrity, edema, etc.): pt on 2L O2 upon arrival to room; 3-4L O2 required while mobilizing to maintain SpO2 90%      Pertinent Vitals/Pain Pain Assessment: Faces Faces Pain Scale: Hurts a little bit Pain Location: abdomen Pain Descriptors / Indicators: Aching Pain Intervention(s): Monitored during session    Home Living                      Prior Function            PT Goals (current goals can now be found in the care plan section) Progress towards PT goals: Progressing toward goals    Frequency    Min 3X/week      PT Plan Discharge plan needs to be updated    Co-evaluation              AM-PAC PT "6 Clicks" Mobility   Outcome  Measure  Help needed turning from your back to your side while in a flat bed without using bedrails?: None Help needed moving from lying on your back to sitting on the side of a flat bed without using bedrails?: A Little Help needed moving to and from a bed to a chair (including a wheelchair)?: A Little Help needed standing up from a chair using your arms (e.g., wheelchair or bedside chair)?: A Little Help needed to walk in hospital room?: A Little Help needed climbing 3-5 steps with a railing? : A Lot 6 Click Score: 18    End of Session Equipment Utilized During Treatment: Gait belt;Oxygen Activity Tolerance: Patient limited by fatigue;Other (comment)(SOB) Patient left: in chair;with call bell/phone within reach;Other (comment)(OT present end of session) Nurse Communication: Mobility status PT Visit Diagnosis: Muscle weakness (generalized)  (M62.81);Difficulty in walking, not elsewhere classified (R26.2);Pain     Time: 6734-1937 PT Time Calculation (min) (ACUTE ONLY): 35 min  Charges:  $Gait Training: 23-37 mins                     Erline Levine, PTA Acute Rehabilitation Services Pager: [phone] Office: [phone]     Carolynne Edouard 12/22/2018, 4:17 PM

## 2018-12-22 NOTE — Evaluation (Signed)
Occupational Therapy Evaluation Patient Details Name: Christopher GageDavid H Pena MRN: [id number] DOB: 1946/04/21 Today's Date: 12/22/2018    History of Present Illness 73 year old male who presented with fever and dyspnea.  He does have significant past medical history for peripheral artery disease status post stenting, hypertension, COPD and chronic back pain.  Reported 24-hour history of dyspnea and fever. Pt dx with RLL PNA.   Clinical Impression   PTA patient independent and driving.  Admitted for above and limited by decreased activity tolerance, generalized weakness.  Patient complete transfers with min assist using RW, UB ADLs with supervision, LB ADLs with min assist, and grooming standing with min guard assist.  Patient on 2L supplemental oxygen via [place] during session, desating to 88% with self care tasks standing but recovers quickly with seated rest break.  Patient will benefit from continued OT services while admitted and recommend intensive CIR level rehab in order to maximize independence with ADLs/mobility for return to PLOF independent level.  Will follow.     Follow Up Recommendations  CIR    Equipment Recommendations  3 in 1 bedside commode    Recommendations for Other Services Rehab consult     Precautions / Restrictions Precautions Precautions: Fall Restrictions Weight Bearing Restrictions: No      Mobility Bed Mobility Overal bed mobility: Modified Independent             General bed mobility comments: OOB upon entry   Transfers Overall transfer level: Needs assistance Equipment used: None Transfers: Sit to/from Stand Sit to Stand: Min assist         General transfer comment: assist to steady without use of AD    Balance Overall balance assessment: Mild deficits observed, not formally tested                                         ADL either performed or assessed with clinical judgement   ADL Overall ADL's : Needs assistance/impaired      Grooming: Min guard;Standing   Upper Body Bathing: Set up;Sitting   Lower Body Bathing: Minimal assistance;Sit to/from stand   Upper Body Dressing : Supervision/safety;Sitting   Lower Body Dressing: Minimal assistance;Sit to/from stand   Toilet Transfer: Ambulation;RW;Minimal assistance   Toileting- Clothing Manipulation and Hygiene: Minimal assistance;Sit to/from stand       Functional mobility during ADLs: Minimal assistance;Rolling walker General ADL Comments: pt limited by generalized weakness and decreased activity tolerance     Vision   Vision Assessment?: No apparent visual deficits     Perception     Praxis      Pertinent Vitals/Pain Pain Assessment: Faces Faces Pain Scale: Hurts a little bit Pain Location: abdomen Pain Descriptors / Indicators: Aching Pain Intervention(s): Monitored during session     Hand Dominance Right   Extremity/Trunk Assessment Upper Extremity Assessment Upper Extremity Assessment: Generalized weakness   Lower Extremity Assessment Lower Extremity Assessment: Defer to PT evaluation   Cervical / Trunk Assessment Cervical / Trunk Assessment: Normal   Communication Communication Communication: No difficulties   Cognition Arousal/Alertness: Awake/alert Behavior During Therapy: WFL for tasks assessed/performed Overall Cognitive Status: Within Functional Limits for tasks assessed                                     General Comments  pt on 2L  supplemental oxgyen via [place], desat to 88% during ADLs but recovers quickly with seated rest breaks     Exercises     Shoulder Instructions      Home Living Family/patient expects to be discharged to:: Private residence Living Arrangements: Alone Available Help at Discharge: Family;Available PRN/intermittently Type of Home: Apartment Home Access: Level entry     Home Layout: One level     Bathroom Shower/Tub: Chief Strategy Officer: Standard      Home Equipment: Hand held shower head;Grab bars - toilet;Grab bars - tub/shower;Walker - 2 wheels;Cane - single point;Shower seat   Additional Comments: pt has 2 daughter very involved but has not seen them during covid and is hesitant      Prior Functioning/Environment Level of Independence: Independent        Comments: independent but was getting SOB even before this episode. Reports that he has only been sponge bathing at home due to SOB        OT Problem List: Decreased activity tolerance;Impaired balance (sitting and/or standing);Decreased knowledge of use of DME or AE;Decreased knowledge of precautions;Cardiopulmonary status limiting activity      OT Treatment/Interventions: Self-care/ADL training;Energy conservation;Therapeutic activities;Patient/family education;Balance training;Therapeutic exercise    OT Goals(Current goals can be found in the care plan section) Acute Rehab OT Goals Patient Stated Goal: to get stronger OT Goal Formulation: With patient Time For Goal Achievement: 01/05/19 Potential to Achieve Goals: Good  OT Frequency: Min 2X/week   Barriers to D/C:            Co-evaluation              AM-PAC OT "6 Clicks" Daily Activity     Outcome Measure Help from another person eating meals?: None Help from another person taking care of personal grooming?: A Little Help from another person toileting, which includes using toliet, bedpan, or urinal?: A Little Help from another person bathing (including washing, rinsing, drying)?: A Little Help from another person to put on and taking off regular upper body clothing?: None Help from another person to put on and taking off regular lower body clothing?: A Little 6 Click Score: 20   End of Session Equipment Utilized During Treatment: Rolling walker;Oxygen Nurse Communication: Mobility status  Activity Tolerance: Patient tolerated treatment well Patient left: in chair;with chair alarm set;with call  bell/phone within reach  OT Visit Diagnosis: Other abnormalities of gait and mobility (R26.89);Muscle weakness (generalized) (M62.81)                Time: 1031-5945 OT Time Calculation (min): 23 min Charges:  OT General Charges $OT Visit: 1 Visit OT Evaluation $OT Eval Moderate Complexity: 1 Mod OT Treatments $Self Care/Home Management : 8-22 mins  Chancy Milroy, OT Acute Rehabilitation Services Pager [phone] Office [phone]   Chancy Milroy 12/22/2018, 4:39 PM

## 2018-12-22 NOTE — Discharge Summary (Addendum)
PROGRESS NOTE    Christopher Pena  ONG:[id number] DOB: Feb 09, 1946 DOA: 12/17/2018 PCP: Sheliah Hatch, MD    Brief Narrative:  73 year old male who presented with fever and dyspnea. He does have significant past medical history for peripheral artery disease status post stenting, hypertension, COPD and chronic back pain. Reported 24-hour history of dyspnea and fever.His symptoms occur after him noticing difficulty swallowing and possible aspiration. Initial physical examination his blood pressure is 134/71, heart rate 103, respiratory rate 25, temperature 98.4, oxygen saturation 95%. In mild respiratory distress, heart S1-S2 present and rhythmic, tachycardic, lungs with no significant wheezing, rales or rhonchi, abdomen soft nontender, no lower extremity edema. Sodium 138, potassium 3.7, chloride 1 3, bicarb 18, glucose 181, BUN 11, creatinine 1.16, white count 19.5, hemoglobin 15.4, hematocrit 46.6, platelets 320. His initial chest x-ray had hyperinflation, bibasilar atelectasis. CT chest negative for pulmonary embolism, no significant infiltrates,bronchitis/bronchiolitis with a right lower lobe and lesser degree right middle lobe. EKG 123 bpm, normal axis, normal intervals, sinus rhythm, no significant ST segment or T wave changes.   Patient was admitted to the hospital with a working diagnosis of sepsis due to right lower lobe aspiration pneumonia.   Assessment & Plan:   Principal Problem:   Sepsis (HCC) Active Problems:   COPD II/III    HTN (hypertension)   Peripheral vascular disease, unspecified (HCC)   Acute on chronic respiratory failure with hypoxia (HCC)   1. Sepsis due to right lower lobe aspiration pneumonia (present on admission) complicated with acute hypoxic respiratory failure. Yesterday patient had positive oxygen desaturation at rest and on ambulation, will need home 02 at discharge. This am continue to feel very weak and deconditioned persistent dyspnea on  exertion. His oxygenation today is 98 on 1,5 Lpm per , he has been afebrile and his wbc continue to trend down. Will change antibiotic therapy to PO Augmentin and will plan for patient discharge in am.   2. COPD exacerbation. On bronchodilator plus inhaled corticosteroids. No significant wheezing on examination. Patient very weak and deconditioned, will need home health.   3. Depression. Continue with sertraline and buspirone.   4. Peripheral vascular disease. Continue with clopidogrel.  5. Hypokalemia. Worsening hypokalemia today, will continue correction with Kcl po, 80 meq today in 2 divided doses and follow on renal panel in am. Renal function with preserved cr.   DVT prophylaxis:enoxaparin Code Status:full Family Communication:no family at the bedside Disposition Plan/ discharge barriers:pending clinical improvement  Body mass index is 24.9 kg/m. Malnutrition Type:      Malnutrition Characteristics:      Nutrition Interventions:     RN Pressure Injury Documentation:     Consultants:     Procedures:     Antimicrobials:       Subjective: Patient with persistent dyspnea and generalized weakness, worse symptoms on exertion, positive desaturation on room air, No nausea or vomiting, no chest pain.   Objective: Vitals:   12/22/18 0056 12/22/18 0256 12/22/18 0731 12/22/18 0805  BP: 140/74 140/77 (!) 147/78   Pulse: 71 62 73   Resp: Temp:   98.3 F (36.8 C)   TempSrc:   Oral   SpO2: (!) 89% 93% 93% 93%  Weight:      Height:        Intake/Output Summary (Last 24 hours) at 12/22/2018 0911 Last data filed at 12/22/2018 0907 Gross per 24 hour  Intake 1183 ml  Output 1425 ml  Net -242 ml  Filed Weights   12/17/18 1325 12/18/18 0647 12/19/18 0351  Weight: 72.6 kg 72.9 kg 72.1 kg    Examination:   General: deconditioned  Neurology: Awake and alert, non focal  E ENT: mild pallor, no icterus, oral mucosa moist  Cardiovascular: No JVD. S1-S2 present, rhythmic, no gallops, rubs, or murmurs. No lower extremity edema. Pulmonary: positive breath sounds bilaterally, no wheezing, no rhonchi, positive right sided rales. Gastrointestinal. Abdomen with no organomegaly, non tender, no rebound or guarding Skin. No rashes Musculoskeletal: no joint deformities     Data Reviewed: I have personally reviewed following labs and imaging studies  CBC: Recent Labs  Lab 12/17/18 1353 12/18/18 0610 12/19/18 0321 12/21/18 0515 12/22/18 0426  WBC 19.5* 16.6* 21.3* 13.9* 11.3*  NEUTROABS 17.6* 14.6*  --  12.0* 8.7*  HGB 15.4 13.1 11.7* 11.8* 11.9*  HCT 46.6 39.6 35.2* 36.0* 35.6*  MCV 91.0 90.8 92.4 92.1 89.9  PLT 302 246 221 288 293   Basic Metabolic Panel: Recent Labs  Lab 12/17/18 1353 12/18/18 0610 12/19/18 0321 12/21/18 0515 12/22/18 0426  NA 138 136 138 139 138  K 3.7 3.6 3.3* 3.6 3.1*  CL 103 105 106 108 101  CO2 18* 22 23 22 25   GLUCOSE 181* 96 132* 87 98  BUN 11 7* 8 12 9   CREATININE 1.16 0.83 0.87 0.87 0.77  CALCIUM 9.2 8.1* 8.1* 8.0* 8.0*   GFR: Estimated Creatinine Clearance: 78 mL/min (by C-G formula based on SCr of 0.77 mg/dL). Liver Function Tests: Recent Labs  Lab 12/17/18 1353 12/18/18 0610  AST 37 27  ALT 26 20  ALKPHOS 77 59  BILITOT 0.7 1.1  PROT 8.1 6.3*  ALBUMIN 3.9 3.1*   No results for input(s): LIPASE, AMYLASE in the last 168 hours. No results for input(s): AMMONIA in the last 168 hours. Coagulation Profile: No results for input(s): INR, PROTIME in the last 168 hours. Cardiac Enzymes: Recent Labs  Lab 12/17/18 1353 12/17/18 1909 12/18/18 0211 12/18/18 0610  TROPONINI 0.03* 0.04* 0.05* 0.04*   BNP (last 3 results) No results for input(s): PROBNP in the last 8760 hours. HbA1C: No results for input(s): HGBA1C in the last 72 hours. CBG: No results for input(s): GLUCAP in the last 168 hours. Lipid Profile: No results for input(s): CHOL, HDL, LDLCALC,  TRIG, CHOLHDL, LDLDIRECT in the last 72 hours. Thyroid Function Tests: No results for input(s): TSH, T4TOTAL, FREET4, T3FREE, THYROIDAB in the last 72 hours. Anemia Panel: No results for input(s): VITAMINB12, FOLATE, FERRITIN, TIBC, IRON, RETICCTPCT in the last 72 hours.    Radiology Studies: I have reviewed all of the imaging during this hospital visit personally     Scheduled Meds: . amoxicillin-clavulanate  1 tablet Oral Q12H  . bisacodyl  5 mg Oral Once  . budesonide (PULMICORT) nebulizer solution  0.25 mg Nebulization BID  . busPIRone  7.5 mg Oral BID  . clopidogrel  75 mg Oral Daily  . enoxaparin (LOVENOX) injection  40 mg Subcutaneous Q24H  . polyethylene glycol  17 g Oral BID  . potassium chloride  40 mEq Oral Q4H  . sertraline  25 mg Oral Daily  . sodium chloride flush  3 mL Intravenous Q12H   Continuous Infusions:   LOS: 5 days        Romana Deaton Annett Gula, MD

## 2018-12-23 ENCOUNTER — Inpatient Hospital Stay (HOSPITAL_COMMUNITY): Payer: Medicare Other

## 2018-12-23 DIAGNOSIS — J9601 Acute respiratory failure with hypoxia: Secondary | ICD-10-CM

## 2018-12-23 LAB — BASIC METABOLIC PANEL
Anion gap: 12 (ref 5–15)
BUN: 7 mg/dL — ABNORMAL LOW (ref 8–23)
CO2: 25 mmol/L (ref 22–32)
Calcium: 8 mg/dL — ABNORMAL LOW (ref 8.9–10.3)
Chloride: 98 mmol/L (ref 98–111)
Creatinine, Ser: 0.65 mg/dL (ref 0.61–1.24)
GFR calc Af Amer: >60 mL/min (ref >60)
GFR calc non Af Amer: >60 mL/min (ref >60)
Glucose, Bld: 102 mg/dL — ABNORMAL HIGH (ref 70–99)
Potassium: 3.2 mmol/L — ABNORMAL LOW (ref 3.5–5.1)
Sodium: 135 mmol/L (ref 135–145)

## 2018-12-23 LAB — MAGNESIUM: Magnesium: 1.8 mg/dL (ref 1.7–2.4)

## 2018-12-23 MED ORDER — POTASSIUM CHLORIDE CRYS ER 20 MEQ PO TBCR
40.0000 meq | EXTENDED_RELEASE_TABLET | Freq: Two times a day (BID) | ORAL | Status: AC
  Administered 2018-12-23 (×2): 40 meq via ORAL
  Filled 2018-12-23 (×2): qty 2

## 2018-12-23 MED ORDER — SODIUM CHLORIDE 0.9 % IV SOLN
3.0000 g | Freq: Four times a day (QID) | INTRAVENOUS | Status: DC
  Administered 2018-12-23 – 2018-12-24 (×4): 3 g via INTRAVENOUS
  Filled 2018-12-23 (×6): qty 3

## 2018-12-23 NOTE — Progress Notes (Signed)
Inpatient Rehabilitation Admissions Coordinator  Inpatient Rehab Consult received. I met with patient at the bedside for rehabilitation assessment. We discussed goals and expectations of an inpatient rehab admission.  Patient feels he has progressed well with therapy today for the first time. He would like to see how he progresses today and likely be ready for d/c tomorrow. I have encouraged him to ambulate with nursing staff every 2 to 3 hrs today. I also instructed him on the use of his incentive spirometer which he requested and he is already using his flutter valve. I instructed him to use IS or Flutter valve at every commercial on TV today and encouraged nursing ambulation. Pt's RN , Eulas Post, made aware of may recommendation as well as Dr. Loleta Books. I will follow up tomorrow but fell likely will be ready to d/c home.  Danne Baxter, RN, MSN Rehab Admissions Coordinator [phone] 12/23/2018 11:47 AM

## 2018-12-23 NOTE — Progress Notes (Signed)
SATURATION QUALIFICATIONS: (This note is used to comply with regulatory documentation for home oxygen)  Patient Saturations on Room Air at Rest = 89%  Patient Saturations on Room Air while Ambulating = 85%  Patient Saturations on 2 Liters of oxygen while Ambulating = 89%  Please briefly explain why patient needs home oxygen: Pt is unable to maintain SpO2 >89% without supplemental O2.  Erline Levine, PTA Acute Rehabilitation Services Pager: [phone] Office: [phone]

## 2018-12-23 NOTE — Progress Notes (Signed)
PROGRESS NOTE    Christopher Pena  OEC:[id number] DOB: [birth year] DOA: 12/17/2018 PCP: Sheliah Hatch, MD      Brief Narrative:  Christopher Pena is a 73 y.o. M with PVD s/p stent, COPD not on home O2, HTN, and chronic back pain who presented with cough, dyspnea with exertion for 1-2 days and fever.    Patient had had a choking episode with eating some days prior.  This progressed to dyspnea on exertion, fever, and cough, so he came to ER where CT chest showed no PE, but did show bronchiolitis/aspiration and he had tachycardia and leukocytosis.       Assessment & Plan:  Sepsis from aspiration pneumonia Presented with tachycardia, tachypnea, hypoxia, leukocytosis.  Source pneumonia.  Subsequent chest x-ray showed right base opacity.  Repeat chest x-ray obtained this morning, reviewed personally by me, shows right base opacity, unchanged from previous. - Switch back from Augmentin to Unasyn -Aggressive pulmonary toilet, start incentive spirometry -Wean oxygen as able   COPD exacerbation Continues to require 2 L supplementary oxygen. -Continue Pulmicort -Continue albuterol PRN - Wean oxygen as able  Peripheral vascular disease Hypertension BP normal, not on home antihypertensives. -Continue Plavix  Depression -Continue BuSpar, Zoloft  Hypokalemia -Supplement K -Check mag  Chronic back pain -Continue oxycodone  SARS-CoV-2 testing negative.  COVID ruled out.     MDM and disposition: The below labs and imaging reports were reviewed and summarized above.  Medication management as above.  The patient was admitted with acute hypoxic respiratory failure and sepsis from aspiration pneumonia provoking COPD exacerbation.  Physical therapy recommended inpatient rehab.  We will continue to treat his pneumonia and COPD flare, as he is evaluated for inpatient rehab.  This is a severe exacerbation of his chronic illness, with superimposed acute illness that poses a threat to  life and bodily function.  He has a persistent hypoxia, requiring 2 L of oxygen to maintain his O2 sat greater than 88% while ambulating.        DVT prophylaxis: Lovenox Code Status: DNR Family Communication: Wife by phone    Consultants:   Rehab  Procedures:     Antimicrobials:   Ceftriaxone x1  Azithro x3d  Unasyn day 6   Subjective: Feeling more energy.  No confusion, fever.  No vomiting.  No chest pain.  Still with cough, still dyspneic relative to baseline, still extremely weak, unable to walk to bathroom alone.  Objective: Vitals:   12/23/18 0500 12/23/18 0540 12/23/18 0600 12/23/18 0835  BP:  (!) 152/78    Pulse:  65 65   Resp:  (!) 22 20   Temp: 97.9 F (36.6 C)   98 F (36.7 C)  TempSrc:    Oral  SpO2:  93% 95%   Weight:      Height:        Intake/Output Summary (Last 24 hours) at 12/23/2018 1337 Last data filed at 12/23/2018 0900 Gross per 24 hour  Intake 580 ml  Output 600 ml  Net -20 ml   Filed Weights   12/17/18 1325 12/18/18 0647 12/19/18 0351  Weight: 72.6 kg 72.9 kg 72.1 kg    Examination: General appearance: elderly adult male, alert and in no acute distress.   HEENT: Anicteric, conjunctiva pink, lids and lashes normal. No nasal deformity, discharge, epistaxis.  Lips moist, dentition edentulous, OP dry, no oral lesions, hearing reduced.   Skin: Warm and dry.  No jaundice.  No suspicious rashes or lesions. Cardiac: RRR, nl  S1-S2, no murmurs appreciated.  Capillary refill is brisk.  JVP normal.  No LE edema.  Radial pulses 2+ and symmetric. Respiratory: Normal respiratory rate and rhythm.  Scant wheezing, rales at right base. Abdomen: Abdomen soft.  No TTP. No ascites, distension, hepatosplenomegaly.   MSK: Muscle bulk and tone reduced, no joint deformities. Neuro: Awake and alert.  EOMI, moves all extremities. Speech fluent.    Psych: Sensorium intact and responding to questions, attention normal. Affect normal.  Judgment and insight  appear normal.    Data Reviewed: I have personally reviewed following labs and imaging studies:  CBC: Recent Labs  Lab 12/17/18 1353 12/18/18 0610 12/19/18 0321 12/21/18 0515 12/22/18 0426  WBC 19.5* 16.6* 21.3* 13.9* 11.3*  NEUTROABS 17.6* 14.6*  --  12.0* 8.7*  HGB 15.4 13.1 11.7* 11.8* 11.9*  HCT 46.6 39.6 35.2* 36.0* 35.6*  MCV 91.0 90.8 92.4 92.1 89.9  PLT 302 246 221 288 293   Basic Metabolic Panel: Recent Labs  Lab 12/18/18 0610 12/19/18 0321 12/21/18 0515 12/22/18 0426 12/23/18 0231  NA 136 138 139 138 135  K 3.6 3.3* 3.6 3.1* 3.2*  CL 105 106 108 101 98  CO2 GLUCOSE 96 132* 87 98 102*  BUN 7* 7*  CREATININE 0.83 0.87 0.87 0.77 0.65  CALCIUM 8.1* 8.1* 8.0* 8.0* 8.0*   GFR: Estimated Creatinine Clearance: 78 mL/min (by C-G formula based on SCr of 0.65 mg/dL). Liver Function Tests: Recent Labs  Lab 12/17/18 1353 12/18/18 0610  AST 37 27  ALT 26 20  ALKPHOS 77 59  BILITOT 0.7 1.1  PROT 8.1 6.3*  ALBUMIN 3.9 3.1*   No results for input(s): LIPASE, AMYLASE in the last 168 hours. No results for input(s): AMMONIA in the last 168 hours. Coagulation Profile: No results for input(s): INR, PROTIME in the last 168 hours. Cardiac Enzymes: Recent Labs  Lab 12/17/18 1353 12/17/18 1909 12/18/18 0211 12/18/18 0610  TROPONINI 0.03* 0.04* 0.05* 0.04*   BNP (last 3 results) No results for input(s): PROBNP in the last 8760 hours. HbA1C: No results for input(s): HGBA1C in the last 72 hours. CBG: No results for input(s): GLUCAP in the last 168 hours. Lipid Profile: No results for input(s): CHOL, HDL, LDLCALC, TRIG, CHOLHDL, LDLDIRECT in the last 72 hours. Thyroid Function Tests: No results for input(s): TSH, T4TOTAL, FREET4, T3FREE, THYROIDAB in the last 72 hours. Anemia Panel: No results for input(s): VITAMINB12, FOLATE, FERRITIN, TIBC, IRON, RETICCTPCT in the last 72 hours. Urine analysis:    Component Value Date/Time    COLORURINE YELLOW 12/17/2018 1624   APPEARANCEUR CLEAR 12/17/2018 1624   LABSPEC 1.017 12/17/2018 1624   PHURINE 6.0 12/17/2018 1624   GLUCOSEU NEGATIVE 12/17/2018 1624   HGBUR NEGATIVE 12/17/2018 1624   BILIRUBINUR NEGATIVE 12/17/2018 1624   KETONESUR 5 (A) 12/17/2018 1624   PROTEINUR NEGATIVE 12/17/2018 1624   UROBILINOGEN 0.2 11/11/2013 1245   NITRITE NEGATIVE 12/17/2018 1624   LEUKOCYTESUR NEGATIVE 12/17/2018 1624   Sepsis Labs: (procalcitonin:4,lacticacidven:4)  ) Recent Results (from the past 240 hour(s))  Blood culture (routine x 2)     Status: None   Collection Time: 12/17/18  1:53 PM  Result Value Ref Range Status   Specimen Description BLOOD LEFT FOREARM  Final   Special Requests   Final    BOTTLES DRAWN AEROBIC AND ANAEROBIC Blood Culture adequate volume   Culture   Final    NO GROWTH 5 DAYS Performed at [facility], [address] [place], [place] [address]    Report Status 12/22/2018 FINAL  Final  Blood culture (routine x 2)     Status: None   Collection Time: 12/17/18  1:53 PM  Result Value Ref Range Status   Specimen Description BLOOD LEFT ANTECUBITAL  Final   Special Requests   Final    BOTTLES DRAWN AEROBIC AND ANAEROBIC Blood Culture adequate volume   Culture   Final    NO GROWTH 5 DAYS Performed at [facility], [address] [place], [place] [address]    Report Status 12/22/2018 FINAL  Final  SARS Coronavirus 2 [facility](Hospital order, Performed in [facility] hospital lab)     Status: None   Collection Time: 12/17/18  1:53 PM  Result Value Ref Range Status   SARS Coronavirus 2 NEGATIVE NEGATIVE Final    Comment: (NOTE) If result is NEGATIVE SARS-CoV-2 target nucleic acids are NOT DETECTED. The SARS-CoV-2 RNA is generally detectable in upper and lower  respiratory specimens during the acute phase of infection. The lowest  concentration of SARS-CoV-2 viral copies this assay can detect is 250  copies / mL. A negative result does  not preclude SARS-CoV-2 infection  and should not be used as the sole basis for treatment or other  patient management decisions.  A negative result may occur with  improper specimen collection / handling, submission of specimen other  than nasopharyngeal swab, presence of viral mutation(s) within the  areas targeted by this assay, and inadequate number of viral copies  (<250 copies / mL). A negative result must be combined with clinical  observations, patient history, and epidemiological information. If result is POSITIVE SARS-CoV-2 target nucleic acids are DETECTED. The SARS-CoV-2 RNA is generally detectable in upper and lower  respiratory specimens dur ing the acute phase of infection.  Positive  results are indicative of active infection with SARS-CoV-2.  Clinical  correlation with patient history and other diagnostic information is  necessary to determine patient infection status.  Positive results do  not rule out bacterial infection or co-infection with other viruses. If result is PRESUMPTIVE POSTIVE SARS-CoV-2 nucleic acids MAY BE PRESENT.   A presumptive positive result was obtained on the submitted specimen  and confirmed on repeat testing.  While 2019 novel coronavirus  (SARS-CoV-2) nucleic acids may be present in the submitted sample  additional confirmatory testing may be necessary for epidemiological  and / or clinical management purposes  to differentiate between  SARS-CoV-2 and other Sarbecovirus currently known to infect humans.  If clinically indicated additional testing with an alternate test  methodology [phone]([id number]) is advised. The SARS-CoV-2 RNA is generally  detectable in upper and lower respiratory sp ecimens during the acute  phase of infection. The expected result is Negative. Fact Sheet for Patients:  BoilerBrush.com.cyhttps://www.fda.gov/media/136312/download Fact Sheet for Healthcare Providers: https://pope.com/https://www.fda.gov/media/136313/download This test is not yet approved or  cleared by the [place] FDA and has been authorized for detection and/or diagnosis of SARS-CoV-2 by FDA under an Emergency Use Authorization (EUA).  This EUA will remain in effect (meaning this test can be used) for the duration of the COVID-19 declaration under Section 564(b)(1) of the Act, 21 U.S.C. section 360bbb-3(b)(1), unless the authorization is terminated or revoked sooner. Performed at [facility], [address] [place], [place] [address]          Radiology Studies: Dg Chest 2 View  Result Date: 12/23/2018 CLINICAL DATA:  Pneumonia EXAM: CHEST - 2  VIEW COMPARISON:  Four days ago FINDINGS: No improvement in airspace disease at the right base. Small pleural effusions. Hyperinflation and emphysema. Normal heart size IMPRESSION: 1. Unchanged pneumonia on the right.  Small pleural effusions. 2. COPD. Electronically Signed   By: Marnee Spring M.D.   On: 12/23/2018 08:31        Scheduled Meds: . bisacodyl  5 mg Oral Once  . budesonide (PULMICORT) nebulizer solution  0.25 mg Nebulization BID  . busPIRone  7.5 mg Oral BID  . clopidogrel  75 mg Oral Daily  . enoxaparin (LOVENOX) injection  40 mg Subcutaneous Q24H  . polyethylene glycol  17 g Oral BID  . sertraline  25 mg Oral Daily  . sodium chloride flush  3 mL Intravenous Q12H   Continuous Infusions: . ampicillin-sulbactam (UNASYN) IV 3 g (12/23/18 1148)     LOS: 6 days    Time spent: 35 minutes    Alberteen Sam, MD [facility] 12/23/2018, 1:37 PM     Please page through AMION:  [email or web address] Password [email or web address] If 7PM-7AM, please contact night-coverage

## 2018-12-23 NOTE — TOC Initial Note (Addendum)
Transition of Care [facility](TOC) - Initial/Assessment Note    Patient Details  Name: Christopher Pena MRN: [id number] Date of Birth: [birth year]  Transition of Care [facility](TOC) CM/SW Contact:    Epifanio Leschesole, Suellyn Meenan Hudson, RN Phone Number: 12/23/2018, 1:39 PM  Clinical Narrative:       Presented with fever, SOB.           Leotis ShamesMelissa Pearson (Daughter)     [phone]       Expected Discharge Plan: Home w Home Health Services Barriers to Discharge: No Barriers Identified   Patient Goals and CMS Choice Patient states their goals for this hospitalization and ongoing recovery are:: "Get home and get back to normal." CMS Medicare.gov Compare Post Acute Care list provided to:: Patient Choice offered to / list presented to : Patient  Expected Discharge Plan and Services Expected Discharge Plan: Home w Home Health Services In-house Referral: Clinical Social Work Discharge Planning Services: CM Consult Post Acute Care Choice: Durable Medical Equipment, Home Health Living arrangements for the past 2 months: Apartment(Ground level, no stairs)                 DME Arranged: Oxygen DME Agency: [employer] Date DME Agency Contacted: 12/21/18 Time DME Agency Contacted: 1310 Representative spoke with at DME Agency: Zack HH Arranged: PT, RN, Speech Therapy HH Agency: [facility] Date HH Agency Contacted: 12/21/18 Time HH Agency Contacted: 1314 Representative spoke with at [facility] Agency: Lupita Leashonna  Prior Living Arrangements/Services Living arrangements for the past 2 months: Apartment(Ground level, no stairs) Lives with:: Self   Do you feel safe going back to the place where you live?: Yes      Need for Family Participation in Patient Care: Yes (Comment) Care giver support system in place?: Yes (comment) Current home services: DME(RW) Criminal Activity/Legal Involvement Pertinent to Current Situation/Hospitalization: No - Comment as needed  Activities of Daily Living Home Assistive  Devices/Equipment: None ADL Screening (condition at time of admission) Patient's cognitive ability adequate to safely complete daily activities?: Yes Is the patient deaf or have difficulty hearing?: No Does the patient have difficulty seeing, even when wearing glasses/contacts?: No Does the patient have difficulty concentrating, remembering, or making decisions?: No Patient able to express need for assistance with ADLs?: Yes Does the patient have difficulty dressing or bathing?: No Independently performs ADLs?: Yes (appropriate for developmental age) Does the patient have difficulty walking or climbing stairs?: No Weakness of Legs: None Weakness of Arms/Hands: None  Permission Sought/Granted Permission sought to share information with : Case Manager Permission granted to share information with : Yes, Verbal Permission Granted  Share Information with NAME: Leotis ShamesMelissa Pearson (Daughter)           Emotional Assessment Appearance:: Appears stated age Attitude/Demeanor/Rapport: Gracious, Engaged Affect (typically observed): Accepting Orientation: : Oriented to Self, Oriented to  Time, Oriented to Situation, Oriented to Place Alcohol / Substance Use: Tobacco Use Psych Involvement: No (comment)  Admission diagnosis:  Severe sepsis (HCC) [A41.9, R65.20] Patient Active Problem List   Diagnosis Date Noted  . Sepsis (HCC) 12/17/2018  . Acute on chronic respiratory failure with hypoxia (HCC) 12/17/2018  . Hyperlipidemia 02/12/2017  . Physical exam 08/14/2016  . Femoral-popliteal bypass graft occlusion, left (HCC) 05/21/2016  . Allergic rhinitis 05/13/2016  . Protein-calorie malnutrition, severe (HCC) 04/08/2016  . Pain in joint, lower leg 10/17/2014  . Visit for wound check 02/21/2014  . Encounter for post surgical wound check 12/09/2013  . Wound drainage-Right medial leg 12/09/2013  . Peripheral  vascular disease, unspecified (HCC) 11/08/2013  . Leg edema, left, foot 06/22/2013  .  Cellulitis of foot, left 06/09/2013  . Non-healing wound of lower extremity, lt great toe 06/09/2013  . Cigarette smoker 06/09/2013  . Claudication, lifestyle limiting 05/11/2013  . Unexplained weight loss 04/28/2013  . PAD (peripheral artery disease), PTA/stent IDEV & chocolate baloon 06/08/13, Previous PTA/Stent to Rt SFA 05/10/13 04/13/2013  . COPD II/III  04/13/2013  . HTN (hypertension) 04/13/2013  . Anxiety 04/13/2013  . Low back pain 04/13/2013   PCP:  Sheliah Hatch, MD Pharmacy:   [facility] [facility] [address] - [place], [place] - [address] [facility] [place] [address] Phone: [phone] Fax: [phone]  [facility] [address] [place] - [address] [place] [address] Phone: [phone] Fax: [phone]  [facility] [place], [place] - [address] [place] [address] Phone: [phone] Fax: [phone]     Social Determinants of Health (SDOH) Interventions    Readmission Risk Interventions No flowsheet data found.

## 2018-12-23 NOTE — Progress Notes (Addendum)
Physical Therapy Treatment Patient Details Name: Christopher Pena MRN: [id number] DOB: 1946-06-20 Today's Date: 12/23/2018    History of Present Illness 73 year old male who presented with fever and dyspnea.  He does have significant past medical history for peripheral artery disease status post stenting, hypertension, COPD and chronic back pain.  Reported 24-hour history of dyspnea and fever. Pt dx with RLL PNA.    PT Comments    Patient seen for mobility progression. Pt is making progress toward PT goals. Pt tolerated gait distance of 61ft using RW with 3 rest breaks. SpO2 desat with mobility (see walking saturations note) but feeling less SOB this session. Pt requires 2L O2 via [place] to maintain SpO2 89% while ambulating. Continue to progress as tolerated.     Follow Up Recommendations  CIR     Equipment Recommendations  Other (comment)(TBD pending progress; if home rollator)    Recommendations for Other Services       Precautions / Restrictions Precautions Precautions: Fall Restrictions Weight Bearing Restrictions: No    Mobility  Bed Mobility Overal bed mobility: Modified Independent                Transfers Overall transfer level: Needs assistance Equipment used: None Transfers: Sit to/from Stand Sit to Stand: Min guard         General transfer comment: min guard to stand; use of RW upon standing  Ambulation/Gait Ambulation/Gait assistance: Min guard;Min assist Gait Distance (Feet): (60 ft total with 3 rest breaks) Assistive device: Rolling walker (2 wheeled);1 person hand held assist Gait Pattern/deviations: Step-through pattern;Decreased stride length;Trunk flexed Gait velocity: decreased   General Gait Details: slow, guarded movements; assist to steady; cues for safe use of AD especially with turning; cues for PLB throughout   Stairs             Wheelchair Mobility    Modified Rankin (Stroke Patients Only)       Balance Overall balance  assessment: Mild deficits observed, not formally tested                                          Cognition Arousal/Alertness: Awake/alert Behavior During Therapy: WFL for tasks assessed/performed Overall Cognitive Status: Within Functional Limits for tasks assessed                                        Exercises      General Comments        Pertinent Vitals/Pain Pain Assessment: 0-10 Pain Score: 5     Home Living                      Prior Function            PT Goals (current goals can now be found in the care plan section) Acute Rehab PT Goals Patient Stated Goal: to get stronger Progress towards PT goals: Progressing toward goals    Frequency    Min 3X/week      PT Plan Current plan remains appropriate    Co-evaluation              AM-PAC PT "6 Clicks" Mobility   Outcome Measure  Help needed turning from your back to your side while in a flat bed without using bedrails?: None  Help needed moving from lying on your back to sitting on the side of a flat bed without using bedrails?: None Help needed moving to and from a bed to a chair (including a wheelchair)?: A Little Help needed standing up from a chair using your arms (e.g., wheelchair or bedside chair)?: None Help needed to walk in hospital room?: A Little Help needed climbing 3-5 steps with a railing? : A Little 6 Click Score: 21    End of Session Equipment Utilized During Treatment: Gait belt;Oxygen Activity Tolerance: Patient tolerated treatment well Patient left: in chair;with call bell/phone within reach Nurse Communication: Mobility status PT Visit Diagnosis: Muscle weakness (generalized) (M62.81);Difficulty in walking, not elsewhere classified (R26.2);Pain     Time: [id number] PT Time Calculation (min) (ACUTE ONLY): 38 min  Charges:  $Gait Training: 23-37 mins                     Christopher Pena, PTA Acute Rehabilitation Services Pager:  [phone] Office: [phone]     Christopher Pena 12/23/2018, 10:41 AM

## 2018-12-24 DIAGNOSIS — J9621 Acute and chronic respiratory failure with hypoxia: Secondary | ICD-10-CM | POA: Diagnosis not present

## 2018-12-24 DIAGNOSIS — J441 Chronic obstructive pulmonary disease with (acute) exacerbation: Secondary | ICD-10-CM | POA: Diagnosis not present

## 2018-12-24 LAB — BASIC METABOLIC PANEL
Anion gap: 11 (ref 5–15)
BUN: 8 mg/dL (ref 8–23)
CO2: 24 mmol/L (ref 22–32)
Calcium: 8 mg/dL — ABNORMAL LOW (ref 8.9–10.3)
Chloride: 101 mmol/L (ref 98–111)
Creatinine, Ser: 0.62 mg/dL (ref 0.61–1.24)
GFR calc Af Amer: >60 mL/min (ref >60)
GFR calc non Af Amer: >60 mL/min (ref >60)
Glucose, Bld: 106 mg/dL — ABNORMAL HIGH (ref 70–99)
Potassium: 3.5 mmol/L (ref 3.5–5.1)
Sodium: 136 mmol/L (ref 135–145)

## 2018-12-24 LAB — CBC
HCT: 36.8 % — ABNORMAL LOW (ref 39.0–52.0)
Hemoglobin: 12.5 g/dL — ABNORMAL LOW (ref 13.0–17.0)
MCH: 30.1 pg (ref 26.0–34.0)
MCHC: 34 g/dL (ref 30.0–36.0)
MCV: 88.7 fL (ref 80.0–100.0)
Platelets: 341 10*3/uL (ref 150–400)
RBC: 4.15 MIL/uL — ABNORMAL LOW (ref 4.22–5.81)
RDW: 13.9 % (ref 11.5–15.5)
WBC: 11.8 10*3/uL — ABNORMAL HIGH (ref 4.0–10.5)
nRBC: 0 % (ref 0.0–0.2)

## 2018-12-24 MED ORDER — AMOXICILLIN-POT CLAVULANATE 875-125 MG PO TABS: 1.0000 | ORAL_TABLET | Freq: Two times a day (BID) | ORAL | 0 refills | Status: AC

## 2018-12-24 MED FILL — AMOX-CLAV 875-125 MG TABLET: 875-125 | 3 days supply | Qty: 5 | Fill #0

## 2018-12-24 NOTE — Progress Notes (Signed)
Physical Therapy Treatment Patient Details Name: Christopher Pena MRN: [id number] DOB: 1946-05-29 Today's Date: 12/24/2018    History of Present Illness 73 year old male who presented with fever and dyspnea.  He does have significant past medical history for peripheral artery disease status post stenting, hypertension, COPD and chronic back pain.  Reported 24-hour history of dyspnea and fever. Pt dx with RLL PNA.    PT Comments    Patient seen for mobility progression. Pt continues to progress toward PT goals and tolerated increased activity this session. Pt requires 3L O2 while ambulating for SpO2 to reach 90% however pt is much less SOB with mobility. Pt is steady with use of rollator and educated on safe use for rest breaks when needed. Given pt's progress recommending HHPT for further skilled PT services to maximize independence and safety with mobility.    Follow Up Recommendations  Home health PT;Supervision - Intermittent (HH aide)     Equipment Recommendations  3in1 (PT)(rollator)    Recommendations for Other Services       Precautions / Restrictions Precautions Precautions: Fall Restrictions Weight Bearing Restrictions: No    Mobility  Bed Mobility Overal bed mobility: Independent                Transfers Overall transfer level: Modified independent Equipment used: None(rollator) Transfers: Sit to/from Stand           General transfer comment: use of rollator upon standing   Ambulation/Gait Ambulation/Gait assistance: Supervision Gait Distance (Feet): (90 ft X 2 trials with seated rest break on rollator) Assistive device: (rollator) Gait Pattern/deviations: Step-through pattern;Decreased stride length;Trunk flexed Gait velocity: decreased   General Gait Details: decreased cadence; supervision for safety/monitoring O2; cues for PLB; 3L O2 required for SpO2 to reach 90%   Stairs             Wheelchair Mobility    Modified Rankin (Stroke  Patients Only)       Balance Overall balance assessment: Mild deficits observed, not formally tested                                          Cognition Arousal/Alertness: Awake/alert Behavior During Therapy: WFL for tasks assessed/performed Overall Cognitive Status: Within Functional Limits for tasks assessed                                        Exercises      General Comments        Pertinent Vitals/Pain Pain Assessment: Faces Faces Pain Scale: No hurt    Home Living                      Prior Function            PT Goals (current goals can now be found in the care plan section) Acute Rehab PT Goals Patient Stated Goal: to get stronger Progress towards PT goals: Progressing toward goals    Frequency    Min 3X/week      PT Plan Discharge plan needs to be updated    Co-evaluation              AM-PAC PT "6 Clicks" Mobility   Outcome Measure  Help needed turning from your back to your side while in a flat  bed without using bedrails?: None Help needed moving from lying on your back to sitting on the side of a flat bed without using bedrails?: None Help needed moving to and from a bed to a chair (including a wheelchair)?: None Help needed standing up from a chair using your arms (e.g., wheelchair or bedside chair)?: None Help needed to walk in hospital room?: None Help needed climbing 3-5 steps with a railing? : A Little 6 Click Score: 23    End of Session Equipment Utilized During Treatment: Gait belt;Oxygen Activity Tolerance: Patient tolerated treatment well Patient left: in chair;with call bell/phone within reach;with chair alarm set Nurse Communication: Mobility status PT Visit Diagnosis: Muscle weakness (generalized) (M62.81);Difficulty in walking, not elsewhere classified (R26.2);Pain     Time: [id number] PT Time Calculation (min) (ACUTE ONLY): 24 min  Charges:  $Gait Training: 23-37 mins                      Erline LevineKellyn Bana Borgmeyer, PTA Acute Rehabilitation Services Pager: [phone] Office: [phone]     Carolynne EdouardKellyn R Vaughn Beaumier 12/24/2018, 9:42 AM

## 2018-12-24 NOTE — Care Management Important Message (Signed)
Important Message  Patient Details  Name: RHYS DEJONGH MRN: [id number] Date of Birth: [birth year]   Medicare Important Message Given:  Yes    Geniya Fulgham Stefan Church 12/24/2018, 1:58 PM

## 2018-12-24 NOTE — Progress Notes (Signed)
Inpatient Rehabilitation Admissions Coordinator  Pt has progressed well with nursing and therapy mobilization to be able to d/c home today. He looks well. We will sign off at this time.  Ottie Glazier, RN, MSN Rehab Admissions Coordinator [phone] 12/24/2018 10:42 AM'

## 2018-12-24 NOTE — TOC Transition Note (Signed)
Transition of Care [facility]) - CM/SW Discharge Note   Patient Details  Name: Christopher Pena MRN: [id number] Date of Birth: 07/28/1946  [facility] CM/SW Contact:  Epifanio Lesches, RN Phone Number: 12/24/2018, 9:53 AM   Clinical Narrative:   Presented with sepsis.  Pt from home alone. States daughter to assist with care once d/c. Pt with d/c to home with home health services, SOC within 24 hrs. Portable oxygen tank and rolator will be delivered to be side prior to d/c. Pt states family will provide transportation to home.  Leotis Shames (Daughter)       [phone]           Final next level of care: Home w Home Health Services Barriers to Discharge: No Barriers Identified   Patient Goals and CMS Choice Patient states their goals for this hospitalization and ongoing recovery are:: "Get home and get back to normal." CMS Medicare.gov Compare Post Acute Care list provided to:: Patient Choice offered to / list presented to : Patient  Discharge Placement                Discharge Plan and Services In-house Referral: Clinical Social Work Discharge Planning Services: CM Consult Post Acute Care Choice: Durable Medical Equipment, Home Health          DME Arranged: Oxygen, Walker rolling with seat DME Agency: [employer] Date DME Agency Contacted: 12/21/18 Time DME Agency Contacted: 1310 Representative spoke with at DME Agency: [facility]ack HH Arranged: PT, RN, Speech Therapy HH Agency: [facility] Date HH Agency Contacted: 12/21/18 Time HH Agency Contacted: 1314 Representative spoke with at [facility] Agency: Lupita Leash  Social Determinants of Health (SDOH) Interventions     Readmission Risk Interventions No flowsheet data found.

## 2018-12-24 NOTE — Discharge Summary (Signed)
Physician Discharge Summary  Christopher GageDavid H Geerdes ZOX:[id number] DOB: [birth year] DOA: 12/17/2018  PCP: Sheliah Hatchabori, Katherine E, MD  Admit date: 12/17/2018 Discharge date: 12/24/2018  Admitted From: Home  Disposition:  Home with home health   Recommendations for Outpatient Follow-up:  1. Follow up with PCP in 1-2 weeks 2. Please obtain BMP/CBC in one week 3. Wean O2 as able 4. Arrange Pulmonology follow up within 2-3 months if not already scheduled    Home Health: Yes  Equipment/Devices: Rollator, 3-in-1  Discharge Condition: Fair CODE STATUS: DO NOT RESUSCITATE Diet recommendation: Regular  Brief/Interim Summary: Christopher Pena is a 73 y.o. M with PVD s/p stent, COPD not on home O2, HTN, and chronic back pain who presented with cough, dyspnea with exertion for 1-2 days and fever.    Patient had had a choking episode with eating some days prior.  This progressed to dyspnea on exertion, fever, and cough, so he came to ER where CT chest showed no PE, but did show bronchiolitis/aspiration and he had tachycardia and leukocytosis.     PRINCIPAL HOSPITAL DIAGNOSIS: Aspiration pneumonia sepsis    Discharge Diagnoses:   Sepsis from aspiration pneumonia Presented with tachycardia, tachypnea, hypoxia, leukocytosis.  Follow up CXR showed right lower lobe lobar pneumonia.    Treated with Unasyn for 6 days. Patient now mentating at baseline, taking orals.  Temp < 100 F, heart rate < 100bpm, RR < 24.   Stable for discharge.     COPD exacerbation Chronic hypoxic respiratory failure FEV1 47% prior to admission.  Not previously on O2, but currently requiring O2.  Peripheral vascular disease Hypertension  Depression  Hypokalemia Resolved  Chronic back pain  SARS-CoV-2 testing negative.  COVID ruled out.               Discharge Instructions  Discharge Instructions    Diet - low sodium heart healthy   Complete by:  As directed    Discharge instructions   Complete  by:  As directed    From Dr. Maryfrances Bunnellanford: You were admitted with pneumonia. Your pneumonia was treated with antibiotics.  You should complete the course with 2 more days of Augmentin. Take Augmentin (amoxicillin-clavulanate) 875-125 mg every 12 hours starting tonight  Use oxygen 2 liters at all times.  Obtain a pulse oximeter (available at the pharmacy over the counter) and make sure that your oxygen level stays above 88% most of the time.  Call your primary care doctor for a follow up appointment, have them check your lab work.   Increase activity slowly   Complete by:  As directed      Allergies as of 12/24/2018      Reactions   Clindamycin/lincomycin Diarrhea      Medication List    TAKE these medications   amoxicillin-clavulanate 875-125 MG tablet Commonly known as:  Augmentin Take 1 tablet by mouth 2 (two) times daily for 3 days.   busPIRone 7.5 MG tablet Commonly known as:  BUSPAR TAKE 1 TABLET BY MOUTH TWICE A DAY   clopidogrel 75 MG tablet Commonly known as:  PLAVIX TAKE 1 TABLET BY MOUTH EVERY DAY   oxyCODONE 15 MG immediate release tablet Commonly known as:  ROXICODONE Take 15 mg by mouth every 4 (four) hours.   sertraline 25 MG tablet Commonly known as:  ZOLOFT TAKE 1 TABLET BY MOUTH EVERY DAY            Durable Medical Equipment  (From admission, onward)  Start     Ordered   12/24/18 1040  For home use only DME Walker rolling  [facility])  Once    Question:  Patient needs a walker to treat with the following condition  Answer:  COPD (chronic obstructive pulmonary disease) (HCC)   12/24/18 1039   12/23/18 1534  DME Oxygen  Once    Comments:  Expected duration of O2 need is: indefinite, >99 months  Question Answer Comment  Mode or (Route) Nasal cannula   Liters per Minute 2   Frequency Continuous (stationary and portable oxygen unit needed)   Oxygen conserving device Yes   Oxygen delivery system Gas      12/23/18 1534   12/23/18 1309  For  home use only DME 4 wheeled rolling walker with seat  Once    Question:  Patient needs a walker to treat with the following condition  Answer:  Weakness   12/23/18 1308         Follow-up Information    Icard, Bradley L, DO. Schedule an appointment as soon as possible for a visit.   Specialty:  Pulmonary Disease Why:  Call for follow up on CT imaging  Contact information: [address] [place] [address] [phone]        [facility] Follow up.   Why:  home health services arranged       [facility] Follow up.   Why:  rolling walker and portable oxygwen tank will be delivered to bedside prior to discharge       Sheliah Hatch, MD Follow up.   Specialty:  Family Medicine Why:  Call to schedule an appointment WITHIN THE NEXT 7-10 days Contact information: [address] [place] [address] [phone]          Allergies  Allergen Reactions  . Clindamycin/Lincomycin Diarrhea    Consultations:  None   Procedures/Studies: Dg Chest 2 View  Result Date: 12/23/2018 CLINICAL DATA:  Pneumonia EXAM: CHEST - 2 VIEW COMPARISON:  Four days ago FINDINGS: No improvement in airspace disease at the right base. Small pleural effusions. Hyperinflation and emphysema. Normal heart size IMPRESSION: 1. Unchanged pneumonia on the right.  Small pleural effusions. 2. COPD. Electronically Signed   By: Marnee Spring M.D.   On: 12/23/2018 08:31   Ct Angio Chest Pe W And/or Wo Contrast  Result Date: 12/17/2018 CLINICAL DATA:  73 year old male with shortness of breath EXAM: CT ANGIOGRAPHY CHEST WITH CONTRAST TECHNIQUE: Multidetector CT imaging of the chest was performed using the standard protocol during bolus administration of intravenous contrast. Multiplanar CT image reconstructions and MIPs were obtained to evaluate the vascular anatomy. CONTRAST:  80mL OMNIPAQUE IOHEXOL 350 MG/ML SOLN COMPARISON:  Chest x-ray 01/02/2016, 09/21/2016, 12/17/2018,  05/04/2013 FINDINGS: Cardiovascular: Heart: No cardiomegaly. No pericardial fluid/thickening. Minimal calcified atherosclerosis of the circumflex coronary artery. Aorta: Unremarkable course, caliber, contour of the thoracic aorta. No aneurysm or dissection flap. No periaortic fluid. Mild atherosclerosis. Branch vessels are patent. Pulmonary arteries: No central, lobar, segmental, or proximal subsegmental filling defects. Mediastinum/Nodes: No mediastinal adenopathy. Unremarkable appearance of the thoracic esophagus. Unremarkable thoracic inlet Lungs/Pleura: Pleuroparenchymal scarring at the apices. Centrilobular and paraseptal emphysema. There is apparent occlusion of the distal right mainstem bronchus and bronchus intermedius with endobronchial debris. Debris extends into the bronchi of the middle lobe and into the right lower lobe. Bronchial wall thickening present of the middle lobe and right lower lobe branches. Associated tree-in-bud pattern of opacity of  the right lower lobe with no pleural effusion. There is 10 mm nodule at the periphery of the right lower lobe on image 66 of series 7. No pneumothorax Upper Abdomen: No acute. Musculoskeletal: No acute displaced fracture. Degenerative changes of the spine. Review of the MIP images confirms the above findings. IMPRESSION: CT negative for pulmonary emboli. Bronchitis/bronchiolitis of the right lower lobe and to a lesser degree the right middle lobe with endobronchial debris, bronchial wall thickening, and tree-in-bud opacity of the right lower lobe. Given the degree of endobronchial debris and nodular changes, short-term follow-up with contrast-enhanced CT is recommended in 4 weeks-6 weeks once the patient has been treated to assure resolution and the absence of an endobronchial lesion. Alternatively, referral for pulmonary evaluation and bronchoscopy may be considered. 1 cm nodule at the periphery of the right lower lobe. This appears to have been present  dating back to chest x-ray 01/02/2016, most likely benign hamartoma or granuloma. Aortic Atherosclerosis (ICD10-I70.0). Associated coronary artery disease Electronically Signed   By: Gilmer Mor D.O.   On: 12/17/2018 17:54   Dg Chest Port 1 View  Result Date: 12/19/2018 CLINICAL DATA:  Dyspnea EXAM: PORTABLE CHEST 1 VIEW COMPARISON:  12/17/2018 chest radiograph. FINDINGS: Stable cardiomediastinal silhouette with normal heart size. No pneumothorax. No pleural effusion. Emphysema. New hazy opacity throughout the right lower lung. No pulmonary edema. IMPRESSION: New hazy opacity throughout the right lower lung suggesting pneumonia. Emphysema. Chest radiograph follow-up advised. Electronically Signed   By: Delbert Phenix M.D.   On: 12/19/2018 09:06   Dg Chest Port 1 View  Result Date: 12/17/2018 CLINICAL DATA:  73 year old male with shortness of breath EXAM: PORTABLE CHEST 1 VIEW COMPARISON:  Prior chest x-ray 09/21/2016 and 01/02/2016 FINDINGS: Cardiac and mediastinal contours remain within normal limits. Atherosclerotic calcifications again noted in the transverse aorta. The lungs remain slightly hyperinflated with chronic background bronchitic changes. Mild biapical pleuroparenchymal scarring. No evidence of pulmonary edema, pleural effusion, pneumothorax or focal airspace consolidation. Calcified granuloma overlying the periphery of the right lower lobe appears similar dating back to 2017. IMPRESSION: Stable chest x-ray without evidence of acute cardiopulmonary process. Background pulmonary parenchymal changes consistent with COPD. Aortic Atherosclerosis (ICD10-170.0) Electronically Signed   By: Malachy Moan M.D.   On: 12/17/2018 14:36       Subjective: No chest pain, fever. Cough improved.  No vomiting, diarrhea.  No confusion.  Mild dyspnea on exertion.  Discharge Exam: Vitals:   12/23/18 2132 12/24/18 0540  BP: 126/65 (!) 148/84  Pulse: 94 65  Resp: 17 16  Temp: 98.2 F (36.8 C) 97.6  F (36.4 C)  SpO2: 93% 92%   Vitals:   12/23/18 0835 12/23/18 2005 12/23/18 2132 12/24/18 0540  BP:   126/65 (!) 148/84  Pulse:   94 65  Resp:   17 16  Temp: 98 F (36.7 C)  98.2 F (36.8 C) 97.6 F (36.4 C)  TempSrc: Oral  Oral Oral  SpO2:  90% 93% 92%  Weight:      Height:        General: Pt is alert, awake, not in acute distress Cardiovascular: RRR, nl S1-S2, no murmurs appreciated.   No LE edema.   Respiratory: Normal respiratory rate and rhythm.  Slightly out of breath with exertion.  Lungs diminished bilaterally, no wheezing.   Abdominal: Abdomen soft and non-tender.  No distension or HSM.   Neuro/Psych: Strength symmetric in upper and lower extremities.  Judgment and insight appear normal.   The  results of significant diagnostics from this hospitalization (including imaging, microbiology, ancillary and laboratory) are listed below for reference.     Microbiology: Recent Results (from the past 240 hour(s))  Blood culture (routine x 2)     Status: None   Collection Time: 12/17/18  1:53 PM  Result Value Ref Range Status   Specimen Description BLOOD LEFT FOREARM  Final   Special Requests   Final    BOTTLES DRAWN AEROBIC AND ANAEROBIC Blood Culture adequate volume   Culture   Final    NO GROWTH 5 DAYS Performed at [facility], [address] [place], [place] [address]    Report Status 12/22/2018 FINAL  Final  Blood culture (routine x 2)     Status: None   Collection Time: 12/17/18  1:53 PM  Result Value Ref Range Status   Specimen Description BLOOD LEFT ANTECUBITAL  Final   Special Requests   Final    BOTTLES DRAWN AEROBIC AND ANAEROBIC Blood Culture adequate volume   Culture   Final    NO GROWTH 5 DAYS Performed at [facility], [address], [place], [place] [address]    Report Status 12/22/2018 FINAL  Final  SARS Coronavirus 2 [facility] order, Performed in [facility] hospital lab)     Status: None   Collection Time: 12/17/18  1:53 PM   Result Value Ref Range Status   SARS Coronavirus 2 NEGATIVE NEGATIVE Final    Comment: (NOTE) If result is NEGATIVE SARS-CoV-2 target nucleic acids are NOT DETECTED. The SARS-CoV-2 RNA is generally detectable in upper and lower  respiratory specimens during the acute phase of infection. The lowest  concentration of SARS-CoV-2 viral copies this assay can detect is 250  copies / mL. A negative result does not preclude SARS-CoV-2 infection  and should not be used as the sole basis for treatment or other  patient management decisions.  A negative result may occur with  improper specimen collection / handling, submission of specimen other  than nasopharyngeal swab, presence of viral mutation(s) within the  areas targeted by this assay, and inadequate number of viral copies  (<250 copies / mL). A negative result must be combined with clinical  observations, patient history, and epidemiological information. If result is POSITIVE SARS-CoV-2 target nucleic acids are DETECTED. The SARS-CoV-2 RNA is generally detectable in upper and lower  respiratory specimens dur ing the acute phase of infection.  Positive  results are indicative of active infection with SARS-CoV-2.  Clinical  correlation with patient history and other diagnostic information is  necessary to determine patient infection status.  Positive results do  not rule out bacterial infection or co-infection with other viruses. If result is PRESUMPTIVE POSTIVE SARS-CoV-2 nucleic acids MAY BE PRESENT.   A presumptive positive result was obtained on the submitted specimen  and confirmed on repeat testing.  While 2019 novel coronavirus  (SARS-CoV-2) nucleic acids may be present in the submitted sample  additional confirmatory testing may be necessary for epidemiological  and / or clinical management purposes  to differentiate between  SARS-CoV-2 and other Sarbecovirus currently known to infect humans.  If clinically indicated additional  testing with an alternate test  methodology [phone]) is advised. The SARS-CoV-2 RNA is generally  detectable in upper and lower respiratory sp ecimens during the acute  phase of infection. The expected result is Negative. Fact Sheet for Patients:  BoilerBrush.com.cy Fact Sheet for Healthcare Providers: https://pope.com/ This test is not yet approved or cleared by  the [employer] and has been authorized for detection and/or diagnosis of SARS-CoV-2 by FDA under an Emergency Use Authorization (EUA).  This EUA will remain in effect (meaning this test can be used) for the duration of the COVID-19 declaration under Section 564(b)(1) of the Act, 21 U.S.C. section 360bbb-3(b)(1), unless the authorization is terminated or revoked sooner. Performed at [facility], [address], [place], [place] [address]      Labs: BNP (last 3 results) Recent Labs    12/17/18 1353  BNP 102.2*   Basic Metabolic Panel: Recent Labs  Lab 12/19/18 0321 12/21/18 0515 12/22/18 0426 12/23/18 0231 12/24/18 0224  NA 138 139 138 135 136  K 3.3* 3.6 3.1* 3.2* 3.5  CL 106 108 101 98 101  CO2 23 22 25 25 24   GLUCOSE 132* 87 98 102* 106*  BUN 8 12 9  7* 8  CREATININE 0.87 0.87 0.77 0.65 0.62  CALCIUM 8.1* 8.0* 8.0* 8.0* 8.0*  MG  --   --   --  1.8  --    Liver Function Tests: Recent Labs  Lab 12/17/18 1353 12/18/18 0610  AST 37 27  ALT 26 20  ALKPHOS 77 59  BILITOT 0.7 1.1  PROT 8.1 6.3*  ALBUMIN 3.9 3.1*   No results for input(s): LIPASE, AMYLASE in the last 168 hours. No results for input(s): AMMONIA in the last 168 hours. CBC: Recent Labs  Lab 12/17/18 1353 12/18/18 0610 12/19/18 0321 12/21/18 0515 12/22/18 0426 12/24/18 0224  WBC 19.5* 16.6* 21.3* 13.9* 11.3* 11.8*  NEUTROABS 17.6* 14.6*  --  12.0* 8.7*  --   HGB 15.4 13.1 11.7* 11.8* 11.9* 12.5*  HCT 46.6 39.6 35.2* 36.0* 35.6* 36.8*  MCV 91.0 90.8 92.4 92.1 89.9 88.7   PLT 302 246 221 288 293 341   Cardiac Enzymes: Recent Labs  Lab 12/17/18 1353 12/17/18 1909 12/18/18 0211 12/18/18 0610  TROPONINI 0.03* 0.04* 0.05* 0.04*   BNP: Invalid input(s): POCBNP CBG: No results for input(s): GLUCAP in the last 168 hours. D-Dimer No results for input(s): DDIMER in the last 72 hours. Hgb A1c No results for input(s): HGBA1C in the last 72 hours. Lipid Profile No results for input(s): CHOL, HDL, LDLCALC, TRIG, CHOLHDL, LDLDIRECT in the last 72 hours. Thyroid function studies No results for input(s): TSH, T4TOTAL, T3FREE, THYROIDAB in the last 72 hours.  Invalid input(s): FREET3 Anemia work up No results for input(s): VITAMINB12, FOLATE, FERRITIN, TIBC, IRON, RETICCTPCT in the last 72 hours. Urinalysis    Component Value Date/Time   COLORURINE YELLOW 12/17/2018 1624   APPEARANCEUR CLEAR 12/17/2018 1624   LABSPEC 1.017 12/17/2018 1624   PHURINE 6.0 12/17/2018 1624   GLUCOSEU NEGATIVE 12/17/2018 1624   HGBUR NEGATIVE 12/17/2018 1624   BILIRUBINUR NEGATIVE 12/17/2018 1624   KETONESUR 5 (A) 12/17/2018 1624   PROTEINUR NEGATIVE 12/17/2018 1624   UROBILINOGEN 0.2 11/11/2013 1245   NITRITE NEGATIVE 12/17/2018 1624   LEUKOCYTESUR NEGATIVE 12/17/2018 1624   Sepsis Labs Invalid input(s): PROCALCITONIN,  WBC,  LACTICIDVEN Microbiology Recent Results (from the past 240 hour(s))  Blood culture (routine x 2)     Status: None   Collection Time: 12/17/18  1:53 PM  Result Value Ref Range Status   Specimen Description BLOOD LEFT FOREARM  Final   Special Requests   Final    BOTTLES DRAWN AEROBIC AND ANAEROBIC Blood Culture adequate volume   Culture   Final    NO GROWTH 5 DAYS Performed at [facility], [address], [place], [place] [address]    Report Status 12/22/2018 FINAL  Final  Blood culture (routine x 2)     Status: None   Collection Time: 12/17/18  1:53 PM  Result Value Ref Range Status   Specimen Description BLOOD LEFT ANTECUBITAL   Final   Special Requests   Final    BOTTLES DRAWN AEROBIC AND ANAEROBIC Blood Culture adequate volume   Culture   Final    NO GROWTH 5 DAYS Performed at [facility], [address] [place], [place] [address]    Report Status 12/22/2018 FINAL  Final  SARS Coronavirus 2 [facility] order, Performed in [facility] hospital lab)     Status: None   Collection Time: 12/17/18  1:53 PM  Result Value Ref Range Status   SARS Coronavirus 2 NEGATIVE NEGATIVE Final    Comment: (NOTE) If result is NEGATIVE SARS-CoV-2 target nucleic acids are NOT DETECTED. The SARS-CoV-2 RNA is generally detectable in upper and lower  respiratory specimens during the acute phase of infection. The lowest  concentration of SARS-CoV-2 viral copies this assay can detect is 250  copies / mL. A negative result does not preclude SARS-CoV-2 infection  and should not be used as the sole basis for treatment or other  patient management decisions.  A negative result may occur with  improper specimen collection / handling, submission of specimen other  than nasopharyngeal swab, presence of viral mutation(s) within the  areas targeted by this assay, and inadequate number of viral copies  (<250 copies / mL). A negative result must be combined with clinical  observations, patient history, and epidemiological information. If result is POSITIVE SARS-CoV-2 target nucleic acids are DETECTED. The SARS-CoV-2 RNA is generally detectable in upper and lower  respiratory specimens dur ing the acute phase of infection.  Positive  results are indicative of active infection with SARS-CoV-2.  Clinical  correlation with patient history and other diagnostic information is  necessary to determine patient infection status.  Positive results do  not rule out bacterial infection or co-infection with other viruses. If result is PRESUMPTIVE POSTIVE SARS-CoV-2 nucleic acids MAY BE PRESENT.   A presumptive positive result was obtained on the  submitted specimen  and confirmed on repeat testing.  While 2019 novel coronavirus  (SARS-CoV-2) nucleic acids may be present in the submitted sample  additional confirmatory testing may be necessary for epidemiological  and / or clinical management purposes  to differentiate between  SARS-CoV-2 and other Sarbecovirus currently known to infect humans.  If clinically indicated additional testing with an alternate test  methodology [phone]) is advised. The SARS-CoV-2 RNA is generally  detectable in upper and lower respiratory sp ecimens during the acute  phase of infection. The expected result is Negative. Fact Sheet for Patients:  BoilerBrush.com.cy Fact Sheet for Healthcare Providers: https://pope.com/ This test is not yet approved or cleared by the [place] FDA and has been authorized for detection and/or diagnosis of SARS-CoV-2 by FDA under an Emergency Use Authorization (EUA).  This EUA will remain in effect (meaning this test can be used) for the duration of the COVID-19 declaration under Section 564(b)(1) of the Act, 21 U.S.C. section 360bbb-3(b)(1), unless the authorization is terminated or revoked sooner. Performed at [facility], [address], [place], [place] [address]      Time coordinating discharge: 40 minutes       SIGNED:   Alberteen Sam, MD  [facility] 12/24/2018, 11:57  AM    

## 2018-12-25 ENCOUNTER — Telehealth: Payer: Self-pay | Admitting: Family Medicine

## 2018-12-25 ENCOUNTER — Telehealth: Payer: Self-pay

## 2018-12-25 DIAGNOSIS — M545 Low back pain: Secondary | ICD-10-CM | POA: Diagnosis not present

## 2018-12-25 DIAGNOSIS — R911 Solitary pulmonary nodule: Secondary | ICD-10-CM | POA: Diagnosis not present

## 2018-12-25 DIAGNOSIS — G7089 Other specified myoneural disorders: Secondary | ICD-10-CM | POA: Diagnosis not present

## 2018-12-25 DIAGNOSIS — G8929 Other chronic pain: Secondary | ICD-10-CM | POA: Diagnosis not present

## 2018-12-25 DIAGNOSIS — J9621 Acute and chronic respiratory failure with hypoxia: Secondary | ICD-10-CM | POA: Diagnosis not present

## 2018-12-25 DIAGNOSIS — J438 Other emphysema: Secondary | ICD-10-CM | POA: Diagnosis not present

## 2018-12-25 DIAGNOSIS — J40 Bronchitis, not specified as acute or chronic: Secondary | ICD-10-CM | POA: Diagnosis not present

## 2018-12-25 DIAGNOSIS — I2510 Atherosclerotic heart disease of native coronary artery without angina pectoris: Secondary | ICD-10-CM | POA: Diagnosis not present

## 2018-12-25 DIAGNOSIS — Z7902 Long term (current) use of antithrombotics/antiplatelets: Secondary | ICD-10-CM | POA: Diagnosis not present

## 2018-12-25 DIAGNOSIS — J219 Acute bronchiolitis, unspecified: Secondary | ICD-10-CM | POA: Diagnosis not present

## 2018-12-25 DIAGNOSIS — J441 Chronic obstructive pulmonary disease with (acute) exacerbation: Secondary | ICD-10-CM | POA: Diagnosis not present

## 2018-12-25 DIAGNOSIS — J9611 Chronic respiratory failure with hypoxia: Secondary | ICD-10-CM | POA: Diagnosis not present

## 2018-12-25 DIAGNOSIS — I10 Essential (primary) hypertension: Secondary | ICD-10-CM | POA: Diagnosis not present

## 2018-12-25 DIAGNOSIS — R1310 Dysphagia, unspecified: Secondary | ICD-10-CM | POA: Diagnosis not present

## 2018-12-25 DIAGNOSIS — J432 Centrilobular emphysema: Secondary | ICD-10-CM | POA: Diagnosis not present

## 2018-12-25 DIAGNOSIS — J690 Pneumonitis due to inhalation of food and vomit: Secondary | ICD-10-CM | POA: Diagnosis not present

## 2018-12-25 DIAGNOSIS — I739 Peripheral vascular disease, unspecified: Secondary | ICD-10-CM | POA: Diagnosis not present

## 2018-12-25 DIAGNOSIS — Z95820 Peripheral vascular angioplasty status with implants and grafts: Secondary | ICD-10-CM | POA: Diagnosis not present

## 2018-12-25 DIAGNOSIS — I700 Atherosclerosis of aorta: Secondary | ICD-10-CM | POA: Diagnosis not present

## 2018-12-25 DIAGNOSIS — Z9981 Dependence on supplemental oxygen: Secondary | ICD-10-CM | POA: Diagnosis not present

## 2018-12-25 DIAGNOSIS — Z87891 Personal history of nicotine dependence: Secondary | ICD-10-CM | POA: Diagnosis not present

## 2018-12-25 NOTE — Telephone Encounter (Signed)
Ok for verbal orders in PCP absence.

## 2018-12-25 NOTE — Telephone Encounter (Signed)
Copied from CRM [id number]. Topic: Quick Communication - Home Health Verbal Orders >> Dec 25, 2018 10:53 AM Doreatha Massed wrote: Caller/Agency: [facility] Callback Number: [phone] Requesting OT/PT/Skilled Nursing/Social Work/Speech Therapy: Nursing Frequency: 1x for 3wk 1x for every other wk for 6wks

## 2018-12-25 NOTE — Telephone Encounter (Signed)
LMOVM giving OK per PCP for all therapy/skilled nursing

## 2018-12-25 NOTE — Telephone Encounter (Signed)
Transition Care Management Follow-up Telephone Call  Admit date: 12/17/2018 Discharge date: 12/24/2018 PRINCIPAL HOSPITAL DIAGNOSIS: Aspiration pneumonia sepsis   How have you been since you were released from the hospital? "Better each day"   Do you understand why you were in the hospital? yes   Do you understand the discharge instructions? yes   Where were you discharged to? Home. Lives alone. Family close and checks on patient several times/day.    Items Reviewed:  Medications reviewed: yes  Allergies reviewed: yes  Dietary changes reviewed: yes  Referrals reviewed: yes, HH RN, PT, OT and speech ordered at discharge.    Functional Questionnaire:   Activities of Daily Living (ADLs):   He states they are independent in the following: ambulation, bathing and hygiene, feeding, continence, grooming, toileting and dressing States they require assistance with the following: None.    Any transportation issues/concerns?: no   Any patient concerns? no   Confirmed importance and date/time of follow-up visits scheduled yes  Provider Appointment booked with PCP via virtual visit on Friday, 01/01/2019.   Confirmed with patient if condition begins to worsen call PCP or go to the ER.  Patient was given the office number and encouraged to call back with question or concerns.  : yes

## 2018-12-28 ENCOUNTER — Encounter (HOSPITAL_COMMUNITY): Payer: Medicare Other

## 2018-12-28 ENCOUNTER — Ambulatory Visit: Payer: Medicare Other | Admitting: Family

## 2018-12-28 ENCOUNTER — Telehealth: Payer: Self-pay | Admitting: Family Medicine

## 2018-12-28 DIAGNOSIS — I700 Atherosclerosis of aorta: Secondary | ICD-10-CM | POA: Diagnosis not present

## 2018-12-28 DIAGNOSIS — I2510 Atherosclerotic heart disease of native coronary artery without angina pectoris: Secondary | ICD-10-CM | POA: Diagnosis not present

## 2018-12-28 DIAGNOSIS — Z9981 Dependence on supplemental oxygen: Secondary | ICD-10-CM | POA: Diagnosis not present

## 2018-12-28 DIAGNOSIS — J690 Pneumonitis due to inhalation of food and vomit: Secondary | ICD-10-CM | POA: Diagnosis not present

## 2018-12-28 DIAGNOSIS — Z95820 Peripheral vascular angioplasty status with implants and grafts: Secondary | ICD-10-CM | POA: Diagnosis not present

## 2018-12-28 DIAGNOSIS — R1310 Dysphagia, unspecified: Secondary | ICD-10-CM | POA: Diagnosis not present

## 2018-12-28 DIAGNOSIS — J9611 Chronic respiratory failure with hypoxia: Secondary | ICD-10-CM | POA: Diagnosis not present

## 2018-12-28 DIAGNOSIS — J432 Centrilobular emphysema: Secondary | ICD-10-CM | POA: Diagnosis not present

## 2018-12-28 DIAGNOSIS — J438 Other emphysema: Secondary | ICD-10-CM | POA: Diagnosis not present

## 2018-12-28 DIAGNOSIS — Z87891 Personal history of nicotine dependence: Secondary | ICD-10-CM | POA: Diagnosis not present

## 2018-12-28 DIAGNOSIS — Z7902 Long term (current) use of antithrombotics/antiplatelets: Secondary | ICD-10-CM | POA: Diagnosis not present

## 2018-12-28 DIAGNOSIS — G8929 Other chronic pain: Secondary | ICD-10-CM | POA: Diagnosis not present

## 2018-12-28 DIAGNOSIS — J40 Bronchitis, not specified as acute or chronic: Secondary | ICD-10-CM | POA: Diagnosis not present

## 2018-12-28 DIAGNOSIS — I10 Essential (primary) hypertension: Secondary | ICD-10-CM | POA: Diagnosis not present

## 2018-12-28 DIAGNOSIS — G7089 Other specified myoneural disorders: Secondary | ICD-10-CM | POA: Diagnosis not present

## 2018-12-28 DIAGNOSIS — I739 Peripheral vascular disease, unspecified: Secondary | ICD-10-CM | POA: Diagnosis not present

## 2018-12-28 DIAGNOSIS — J219 Acute bronchiolitis, unspecified: Secondary | ICD-10-CM | POA: Diagnosis not present

## 2018-12-28 DIAGNOSIS — M545 Low back pain: Secondary | ICD-10-CM | POA: Diagnosis not present

## 2018-12-28 DIAGNOSIS — R911 Solitary pulmonary nodule: Secondary | ICD-10-CM | POA: Diagnosis not present

## 2018-12-28 NOTE — Telephone Encounter (Unsigned)
Copied from CRM [id number]. Topic: Quick Communication - Home Health Verbal Orders >> Dec 28, 2018  1:48 PM Debroah Loop wrote: Caller/Agency: Duaine Dredge, w/ [facility] Callback Number: [phone] Requesting OT/PT/Skilled Nursing/Social Work/Speech Therapy: speech Frequency: 1x a wk for 3wks

## 2018-12-28 NOTE — Telephone Encounter (Signed)
Please advise on HH orders

## 2018-12-28 NOTE — Telephone Encounter (Signed)
Called and left a detailed message giving the verbal ok for speech therapy

## 2018-12-28 NOTE — Telephone Encounter (Signed)
Ok for verbal orders ?

## 2018-12-29 ENCOUNTER — Telehealth: Payer: Self-pay | Admitting: Family Medicine

## 2018-12-29 NOTE — Telephone Encounter (Unsigned)
Copied from CRM [id number]. Topic: Quick Communication - Home Health Verbal Orders >> Dec 29, 2018  1:01 PM Debroah Loop wrote: Caller/Agency: Janeth Rase, w/ [facility] Callback Number: [phone] Requesting OT/PT/Skilled Nursing/Social Work/Speech Therapy: OT Frequency: 2x for 2wk, 1x a wk for 1wk

## 2018-12-30 ENCOUNTER — Telehealth: Payer: Self-pay | Admitting: *Deleted

## 2018-12-30 DIAGNOSIS — R1310 Dysphagia, unspecified: Secondary | ICD-10-CM | POA: Diagnosis not present

## 2018-12-30 DIAGNOSIS — M545 Low back pain: Secondary | ICD-10-CM | POA: Diagnosis not present

## 2018-12-30 DIAGNOSIS — J9611 Chronic respiratory failure with hypoxia: Secondary | ICD-10-CM | POA: Diagnosis not present

## 2018-12-30 DIAGNOSIS — I2510 Atherosclerotic heart disease of native coronary artery without angina pectoris: Secondary | ICD-10-CM | POA: Diagnosis not present

## 2018-12-30 DIAGNOSIS — G7089 Other specified myoneural disorders: Secondary | ICD-10-CM | POA: Diagnosis not present

## 2018-12-30 DIAGNOSIS — G8929 Other chronic pain: Secondary | ICD-10-CM | POA: Diagnosis not present

## 2018-12-30 DIAGNOSIS — J219 Acute bronchiolitis, unspecified: Secondary | ICD-10-CM | POA: Diagnosis not present

## 2018-12-30 DIAGNOSIS — I700 Atherosclerosis of aorta: Secondary | ICD-10-CM | POA: Diagnosis not present

## 2018-12-30 DIAGNOSIS — J438 Other emphysema: Secondary | ICD-10-CM | POA: Diagnosis not present

## 2018-12-30 DIAGNOSIS — Z95820 Peripheral vascular angioplasty status with implants and grafts: Secondary | ICD-10-CM | POA: Diagnosis not present

## 2018-12-30 DIAGNOSIS — Z87891 Personal history of nicotine dependence: Secondary | ICD-10-CM | POA: Diagnosis not present

## 2018-12-30 DIAGNOSIS — Z9981 Dependence on supplemental oxygen: Secondary | ICD-10-CM | POA: Diagnosis not present

## 2018-12-30 DIAGNOSIS — I739 Peripheral vascular disease, unspecified: Secondary | ICD-10-CM | POA: Diagnosis not present

## 2018-12-30 DIAGNOSIS — J690 Pneumonitis due to inhalation of food and vomit: Secondary | ICD-10-CM | POA: Diagnosis not present

## 2018-12-30 DIAGNOSIS — R911 Solitary pulmonary nodule: Secondary | ICD-10-CM | POA: Diagnosis not present

## 2018-12-30 DIAGNOSIS — J432 Centrilobular emphysema: Secondary | ICD-10-CM | POA: Diagnosis not present

## 2018-12-30 DIAGNOSIS — J40 Bronchitis, not specified as acute or chronic: Secondary | ICD-10-CM | POA: Diagnosis not present

## 2018-12-30 DIAGNOSIS — Z7902 Long term (current) use of antithrombotics/antiplatelets: Secondary | ICD-10-CM | POA: Diagnosis not present

## 2018-12-30 DIAGNOSIS — I10 Essential (primary) hypertension: Secondary | ICD-10-CM | POA: Diagnosis not present

## 2018-12-30 NOTE — Telephone Encounter (Signed)
Ok for orders? 

## 2018-12-30 NOTE — Telephone Encounter (Signed)
TC to patient regarding possible exposure during 12/17/18 [facility] ED encounter. Patient requested a callback later today to schedule drive thru [id number] testing.

## 2018-12-30 NOTE — Telephone Encounter (Signed)
Scheduled patient for Covid-19 drive thru testing due to possible exposure. Appointment is Thursday 12/30/18 at 11:30am at [facility]. Patient is aware to pull thru circle, stay in car and wear mask.

## 2018-12-30 NOTE — Telephone Encounter (Signed)
Called and gave the verbal ok per PCP.

## 2018-12-31 ENCOUNTER — Other Ambulatory Visit (HOSPITAL_COMMUNITY)
Admission: RE | Admit: 2018-12-31 | Discharge: 2018-12-31 | Disposition: A | Discharge status: Home / Self Care | Payer: Medicare Other | Source: Ambulatory Visit | Attending: Internal Medicine | Admitting: Internal Medicine

## 2018-12-31 ENCOUNTER — Other Ambulatory Visit: Payer: Self-pay

## 2018-12-31 DIAGNOSIS — M79605 Pain in left leg: Secondary | ICD-10-CM | POA: Diagnosis not present

## 2018-12-31 DIAGNOSIS — M545 Low back pain: Secondary | ICD-10-CM | POA: Diagnosis not present

## 2018-12-31 DIAGNOSIS — M5432 Sciatica, left side: Secondary | ICD-10-CM | POA: Diagnosis not present

## 2018-12-31 DIAGNOSIS — Z87891 Personal history of nicotine dependence: Secondary | ICD-10-CM | POA: Diagnosis not present

## 2018-12-31 DIAGNOSIS — G894 Chronic pain syndrome: Secondary | ICD-10-CM | POA: Diagnosis not present

## 2018-12-31 DIAGNOSIS — I700 Atherosclerosis of aorta: Secondary | ICD-10-CM | POA: Diagnosis not present

## 2018-12-31 DIAGNOSIS — G7089 Other specified myoneural disorders: Secondary | ICD-10-CM | POA: Diagnosis not present

## 2018-12-31 DIAGNOSIS — J40 Bronchitis, not specified as acute or chronic: Secondary | ICD-10-CM | POA: Diagnosis not present

## 2018-12-31 DIAGNOSIS — Z20828 Contact with and (suspected) exposure to other viral communicable diseases: Secondary | ICD-10-CM | POA: Insufficient documentation

## 2018-12-31 DIAGNOSIS — M5431 Sciatica, right side: Secondary | ICD-10-CM | POA: Diagnosis not present

## 2018-12-31 DIAGNOSIS — G8929 Other chronic pain: Secondary | ICD-10-CM | POA: Diagnosis not present

## 2018-12-31 DIAGNOSIS — I739 Peripheral vascular disease, unspecified: Secondary | ICD-10-CM | POA: Diagnosis not present

## 2018-12-31 DIAGNOSIS — I10 Essential (primary) hypertension: Secondary | ICD-10-CM | POA: Diagnosis not present

## 2018-12-31 DIAGNOSIS — M79604 Pain in right leg: Secondary | ICD-10-CM | POA: Diagnosis not present

## 2018-12-31 DIAGNOSIS — R1310 Dysphagia, unspecified: Secondary | ICD-10-CM | POA: Diagnosis not present

## 2018-12-31 DIAGNOSIS — R911 Solitary pulmonary nodule: Secondary | ICD-10-CM | POA: Diagnosis not present

## 2018-12-31 DIAGNOSIS — Z79891 Long term (current) use of opiate analgesic: Secondary | ICD-10-CM | POA: Diagnosis not present

## 2018-12-31 DIAGNOSIS — J9611 Chronic respiratory failure with hypoxia: Secondary | ICD-10-CM | POA: Diagnosis not present

## 2018-12-31 DIAGNOSIS — J219 Acute bronchiolitis, unspecified: Secondary | ICD-10-CM | POA: Diagnosis not present

## 2018-12-31 DIAGNOSIS — Z95820 Peripheral vascular angioplasty status with implants and grafts: Secondary | ICD-10-CM | POA: Diagnosis not present

## 2018-12-31 DIAGNOSIS — I2510 Atherosclerotic heart disease of native coronary artery without angina pectoris: Secondary | ICD-10-CM | POA: Diagnosis not present

## 2018-12-31 DIAGNOSIS — J432 Centrilobular emphysema: Secondary | ICD-10-CM | POA: Diagnosis not present

## 2018-12-31 DIAGNOSIS — J690 Pneumonitis due to inhalation of food and vomit: Secondary | ICD-10-CM | POA: Diagnosis not present

## 2018-12-31 DIAGNOSIS — J438 Other emphysema: Secondary | ICD-10-CM | POA: Diagnosis not present

## 2018-12-31 DIAGNOSIS — Z9981 Dependence on supplemental oxygen: Secondary | ICD-10-CM | POA: Diagnosis not present

## 2018-12-31 DIAGNOSIS — Z7902 Long term (current) use of antithrombotics/antiplatelets: Secondary | ICD-10-CM | POA: Diagnosis not present

## 2018-12-31 LAB — SARS CORONAVIRUS 2 BY RT PCR (HOSPITAL ORDER, PERFORMED IN ~~LOC~~ HOSPITAL LAB): SARS Coronavirus 2: NEGATIVE

## 2019-01-01 ENCOUNTER — Ambulatory Visit (INDEPENDENT_AMBULATORY_CARE_PROVIDER_SITE_OTHER): Payer: Medicare Other | Admitting: Family Medicine

## 2019-01-01 ENCOUNTER — Encounter: Payer: Self-pay | Admitting: Family Medicine

## 2019-01-01 ENCOUNTER — Telehealth: Payer: Self-pay | Admitting: Family Medicine

## 2019-01-01 VITALS — HR 88 | Temp 98.4°F | Ht 67.0 in | Wt 160.0 lb

## 2019-01-01 DIAGNOSIS — Z20828 Contact with and (suspected) exposure to other viral communicable diseases: Secondary | ICD-10-CM | POA: Diagnosis not present

## 2019-01-01 DIAGNOSIS — J9621 Acute and chronic respiratory failure with hypoxia: Secondary | ICD-10-CM | POA: Diagnosis not present

## 2019-01-01 DIAGNOSIS — Z8619 Personal history of other infectious and parasitic diseases: Secondary | ICD-10-CM | POA: Diagnosis not present

## 2019-01-01 DIAGNOSIS — A419 Sepsis, unspecified organism: Secondary | ICD-10-CM

## 2019-01-01 DIAGNOSIS — E43 Unspecified severe protein-calorie malnutrition: Secondary | ICD-10-CM | POA: Diagnosis not present

## 2019-01-01 DIAGNOSIS — J449 Chronic obstructive pulmonary disease, unspecified: Secondary | ICD-10-CM | POA: Diagnosis not present

## 2019-01-01 DIAGNOSIS — Z20822 Contact with and (suspected) exposure to covid-19: Secondary | ICD-10-CM

## 2019-01-01 NOTE — Progress Notes (Signed)
Virtual Visit via Video   I connected with patient on 01/01/19 at 10:00 AM EDT by a video enabled telemedicine application and verified that I am speaking with the correct person using two identifiers.  Location patient: Home Location provider: [facility] [place] Persons participating in the virtual visit: Patient, Provider, CMA Toma Copier(Bethany D)  I discussed the limitations of evaluation and management by telemedicine and the availability of in person appointments. The patient expressed understanding and agreed to proceed.  Subjective:   HPI:   Hospital f/u- pt was admitted 4/30-5/7 w/ sepsis due to aspiration PNA.  He was treated w/ Unasyn for 6 days and then switched to Augmentin.  COVID test was negative but he was called on 5/13 and told he was exposed during his hospitalization and was sent for repeat testing.  Repeat test was also negative.  He was not O2 dependent prior to hospitalization but is now on O2 at 1.5L w/ sats of 90-91%.  Pt felt that he likely needed O2 before hospitalization.  Pt reports continued SOB but much less than previous.  Pt reported low grade temp yesterday- Tm 99.8.  Resolved w/ 1 tylenol and no fever this AM.  Continues to have dry cough.  Pt went to pain management yesterday- wore a mask- otherwise has been self isolating.  Pt was told to f/u w/ Pulmonary after d/c.  Sees Dr Sherene SiresWert.  Needs new referral.  Reviewed past medical, surgical, family and social histories.  Reviewed inpt notes, labs, images, d/c summary  ROS:   See pertinent positives and negatives per HPI.  Patient Active Problem List   Diagnosis Date Noted  . Sepsis (HCC) 12/17/2018  . Acute on chronic respiratory failure with hypoxia (HCC) 12/17/2018  . Hyperlipidemia 02/12/2017  . Physical exam 08/14/2016  . Femoral-popliteal bypass graft occlusion, left (HCC) 05/21/2016  . Allergic rhinitis 05/13/2016  . Protein-calorie malnutrition, severe (HCC) 04/08/2016  . Pain in joint, lower  leg 10/17/2014  . Visit for wound check 02/21/2014  . Encounter for post surgical wound check 12/09/2013  . Wound drainage-Right medial leg 12/09/2013  . Peripheral vascular disease, unspecified (HCC) 11/08/2013  . Leg edema, left, foot 06/22/2013  . Cellulitis of foot, left 06/09/2013  . Non-healing wound of lower extremity, lt great toe 06/09/2013  . Cigarette smoker 06/09/2013  . Claudication, lifestyle limiting 05/11/2013  . Unexplained weight loss 04/28/2013  . PAD (peripheral artery disease), PTA/stent IDEV & chocolate baloon 06/08/13, Previous PTA/Stent to Rt SFA 05/10/13 04/13/2013  . COPD II/III  04/13/2013  . HTN (hypertension) 04/13/2013  . Anxiety 04/13/2013  . Low back pain 04/13/2013    Social History   Tobacco Use  . Smoking status: Former Smoker    Packs/day: 0.25    Years: 50.00    Pack years: 12.50    Types: Cigarettes    Start date: 11/14/2015    Last attempt to quit: 11/13/2016    Years since quitting: 2.1  . Smokeless tobacco: Never Used  Substance Use Topics  . Alcohol use: No    Alcohol/week: 0.0 standard drinks    Comment: 06/08/2013 "quit 02/2007; drank my share before that though"    Current Outpatient Medications:  .  busPIRone (BUSPAR) 7.5 MG tablet, TAKE 1 TABLET BY MOUTH TWICE A DAY, Disp: 180 tablet, Rfl: 1 .  cilostazol (PLETAL) 100 MG tablet, Take 100 mg by mouth 2 (two) times daily., Disp: , Rfl:  .  clopidogrel (PLAVIX) 75 MG tablet, TAKE 1 TABLET BY  MOUTH EVERY DAY, Disp: 90 tablet, Rfl: 1 .  oxyCODONE (ROXICODONE) 15 MG immediate release tablet, Take 15 mg by mouth every 4 (four) hours., Disp: , Rfl:  .  sertraline (ZOLOFT) 25 MG tablet, TAKE 1 TABLET BY MOUTH EVERY DAY, Disp: 30 tablet, Rfl: 4  Allergies  Allergen Reactions  . Clindamycin/Lincomycin Diarrhea    Objective:   Pulse 88   Temp 98.4 F (36.9 C)   Ht 5\' 7"  (1.702 m)   Wt 160 lb (72.6 kg)   SpO2 92% Comment: Pt is currently on 2L of O2  BMI 25.06 kg/m  AAOx3, NAD  NCAT, EOMI No obvious CN deficits O2 in place via  Coloring WNL Pt is able to speak clearly, coherently without shortness of breath or increased work of breathing.  Thought process is linear.  Mood is appropriate.   Assessment and Plan:   Sepsis- resolved.  Pt has completed his abx and reports feeling much better than when he was d/c'd.  Acute on chronic respiratory failure- pt is now on O2 at all times.  He feels this is much better for him.  Will refer back to [facility] for complete evaluation.  COPD- ongoing issue for pt.  Now O2 dependent.  Refer back to pulm for ongoing evaluation and tx  COVID exposure- pt was called by hospital and told he was exposed to COVID while in the ER.  Had negative test yesterday but then had a few hours of low grade temp.  Continues to have cough from hospitalization so it is difficult to determine if this is a sx of note.  Will send him the home monitoring protocol.  Pt expressed understanding and is in agreement w/ plan.    [name], MD 01/01/2019

## 2019-01-01 NOTE — Telephone Encounter (Signed)
Pt. States Dr. Beverely Low called and told him his COVID 19 test came back negative. Calling to confirm this. Confirmed test with pt.

## 2019-01-01 NOTE — Progress Notes (Signed)
I have discussed the procedure for the virtual visit with the patient who has given consent to proceed with assessment and treatment.   Bama Hanselman, CMA     

## 2019-01-04 ENCOUNTER — Telehealth: Payer: Self-pay | Admitting: General Practice

## 2019-01-04 ENCOUNTER — Other Ambulatory Visit: Payer: Medicare Other

## 2019-01-04 ENCOUNTER — Telehealth: Payer: Self-pay

## 2019-01-04 DIAGNOSIS — Z20822 Contact with and (suspected) exposure to covid-19: Secondary | ICD-10-CM

## 2019-01-04 DIAGNOSIS — Z95820 Peripheral vascular angioplasty status with implants and grafts: Secondary | ICD-10-CM | POA: Diagnosis not present

## 2019-01-04 DIAGNOSIS — I10 Essential (primary) hypertension: Secondary | ICD-10-CM | POA: Diagnosis not present

## 2019-01-04 DIAGNOSIS — G8929 Other chronic pain: Secondary | ICD-10-CM | POA: Diagnosis not present

## 2019-01-04 DIAGNOSIS — I2510 Atherosclerotic heart disease of native coronary artery without angina pectoris: Secondary | ICD-10-CM | POA: Diagnosis not present

## 2019-01-04 DIAGNOSIS — I739 Peripheral vascular disease, unspecified: Secondary | ICD-10-CM | POA: Diagnosis not present

## 2019-01-04 DIAGNOSIS — J432 Centrilobular emphysema: Secondary | ICD-10-CM | POA: Diagnosis not present

## 2019-01-04 DIAGNOSIS — J40 Bronchitis, not specified as acute or chronic: Secondary | ICD-10-CM | POA: Diagnosis not present

## 2019-01-04 DIAGNOSIS — J690 Pneumonitis due to inhalation of food and vomit: Secondary | ICD-10-CM | POA: Diagnosis not present

## 2019-01-04 DIAGNOSIS — Z7902 Long term (current) use of antithrombotics/antiplatelets: Secondary | ICD-10-CM | POA: Diagnosis not present

## 2019-01-04 DIAGNOSIS — Z87891 Personal history of nicotine dependence: Secondary | ICD-10-CM | POA: Diagnosis not present

## 2019-01-04 DIAGNOSIS — R911 Solitary pulmonary nodule: Secondary | ICD-10-CM | POA: Diagnosis not present

## 2019-01-04 DIAGNOSIS — J219 Acute bronchiolitis, unspecified: Secondary | ICD-10-CM | POA: Diagnosis not present

## 2019-01-04 DIAGNOSIS — G7089 Other specified myoneural disorders: Secondary | ICD-10-CM | POA: Diagnosis not present

## 2019-01-04 DIAGNOSIS — I700 Atherosclerosis of aorta: Secondary | ICD-10-CM | POA: Diagnosis not present

## 2019-01-04 DIAGNOSIS — M545 Low back pain: Secondary | ICD-10-CM | POA: Diagnosis not present

## 2019-01-04 DIAGNOSIS — Z9981 Dependence on supplemental oxygen: Secondary | ICD-10-CM | POA: Diagnosis not present

## 2019-01-04 DIAGNOSIS — J438 Other emphysema: Secondary | ICD-10-CM | POA: Diagnosis not present

## 2019-01-04 DIAGNOSIS — R6889 Other general symptoms and signs: Secondary | ICD-10-CM | POA: Diagnosis not present

## 2019-01-04 DIAGNOSIS — R1310 Dysphagia, unspecified: Secondary | ICD-10-CM | POA: Diagnosis not present

## 2019-01-04 DIAGNOSIS — J9611 Chronic respiratory failure with hypoxia: Secondary | ICD-10-CM | POA: Diagnosis not present

## 2019-01-04 NOTE — Telephone Encounter (Signed)
Revonda Standard called and wanted to report some abnormalities with pt from this weekend. Friday while therapist was in home with pt they advised that he had a fever of 99.8, was sweaty, and felt feverish all over his body. Saturday and Sunday his temp dropped to 96.7 which was the lowest th patient can ever remember. He also had sever nausea on Saturday which lasted about an hour. Pt has been off all antibiotics for a week. But has also reported having loose stools for that same length of time. Pt was having some lower, right side back pain and discomfort with deep breaths. Pt did test negative for corona.   Revonda Standard is worried about patient said she just wanted to make sure we were advised. She would like a call back to know how we plan ot proceed so she can update pt chart with them .

## 2019-01-04 NOTE — Telephone Encounter (Signed)
Patient called and advised the request to be tested for COVID-19 by PCP, he verbalized understanding, appointment scheduled for today at 1450 at [facility], address given to patient, advised to wear a mask.

## 2019-01-04 NOTE — Telephone Encounter (Signed)
Called and spoke with pt. He sounded great on the phone no wheeze, did not sound short of breath, and denied any shortness of breath. Said his back pain had gotten better as well. . Pt is ok with proceeding with repeat covid testing. Message routed per [facility] procedures.

## 2019-01-04 NOTE — Telephone Encounter (Signed)
I would like pt re-tested for [facility].  He has known exposure and a negative test doesn't ensure he doesn't now have it.  Risk factors- age, COPD.  Sxs- fever, chills, nausea, diarrhea, dyspnea.

## 2019-01-04 NOTE — Telephone Encounter (Signed)
Called and LM with [facility] speech therapist to also advise Christopher Pena is going for repeat testing at 14:50 today.

## 2019-01-06 ENCOUNTER — Telehealth: Payer: Self-pay | Admitting: Family Medicine

## 2019-01-06 DIAGNOSIS — J438 Other emphysema: Secondary | ICD-10-CM

## 2019-01-06 DIAGNOSIS — I739 Peripheral vascular disease, unspecified: Secondary | ICD-10-CM

## 2019-01-06 DIAGNOSIS — G7089 Other specified myoneural disorders: Secondary | ICD-10-CM

## 2019-01-06 DIAGNOSIS — M545 Low back pain: Secondary | ICD-10-CM

## 2019-01-06 DIAGNOSIS — J40 Bronchitis, not specified as acute or chronic: Secondary | ICD-10-CM | POA: Diagnosis not present

## 2019-01-06 DIAGNOSIS — I700 Atherosclerosis of aorta: Secondary | ICD-10-CM

## 2019-01-06 DIAGNOSIS — Z87891 Personal history of nicotine dependence: Secondary | ICD-10-CM

## 2019-01-06 DIAGNOSIS — R911 Solitary pulmonary nodule: Secondary | ICD-10-CM

## 2019-01-06 DIAGNOSIS — I2510 Atherosclerotic heart disease of native coronary artery without angina pectoris: Secondary | ICD-10-CM

## 2019-01-06 DIAGNOSIS — R1310 Dysphagia, unspecified: Secondary | ICD-10-CM | POA: Diagnosis not present

## 2019-01-06 DIAGNOSIS — F419 Anxiety disorder, unspecified: Secondary | ICD-10-CM

## 2019-01-06 DIAGNOSIS — J219 Acute bronchiolitis, unspecified: Secondary | ICD-10-CM | POA: Diagnosis not present

## 2019-01-06 DIAGNOSIS — J9611 Chronic respiratory failure with hypoxia: Secondary | ICD-10-CM

## 2019-01-06 DIAGNOSIS — I10 Essential (primary) hypertension: Secondary | ICD-10-CM

## 2019-01-06 DIAGNOSIS — Z7902 Long term (current) use of antithrombotics/antiplatelets: Secondary | ICD-10-CM

## 2019-01-06 DIAGNOSIS — Z9981 Dependence on supplemental oxygen: Secondary | ICD-10-CM

## 2019-01-06 DIAGNOSIS — F329 Major depressive disorder, single episode, unspecified: Secondary | ICD-10-CM

## 2019-01-06 DIAGNOSIS — Z95820 Peripheral vascular angioplasty status with implants and grafts: Secondary | ICD-10-CM

## 2019-01-06 DIAGNOSIS — J432 Centrilobular emphysema: Secondary | ICD-10-CM | POA: Diagnosis not present

## 2019-01-06 DIAGNOSIS — J690 Pneumonitis due to inhalation of food and vomit: Secondary | ICD-10-CM | POA: Diagnosis not present

## 2019-01-06 DIAGNOSIS — G8929 Other chronic pain: Secondary | ICD-10-CM

## 2019-01-06 NOTE — Telephone Encounter (Signed)
Form completed and placed in basket  

## 2019-01-06 NOTE — Telephone Encounter (Signed)
Paperwork placed in PCP folder for signatures.

## 2019-01-06 NOTE — Telephone Encounter (Signed)
fyi

## 2019-01-06 NOTE — Telephone Encounter (Signed)
Received Home Health Certification from [facility], placed in bin upfront with charge sheet.

## 2019-01-06 NOTE — Telephone Encounter (Signed)
Received forms from back and faxed. °

## 2019-01-07 DIAGNOSIS — Z87891 Personal history of nicotine dependence: Secondary | ICD-10-CM | POA: Diagnosis not present

## 2019-01-07 DIAGNOSIS — R911 Solitary pulmonary nodule: Secondary | ICD-10-CM | POA: Diagnosis not present

## 2019-01-07 DIAGNOSIS — J690 Pneumonitis due to inhalation of food and vomit: Secondary | ICD-10-CM | POA: Diagnosis not present

## 2019-01-07 DIAGNOSIS — I2510 Atherosclerotic heart disease of native coronary artery without angina pectoris: Secondary | ICD-10-CM | POA: Diagnosis not present

## 2019-01-07 DIAGNOSIS — I10 Essential (primary) hypertension: Secondary | ICD-10-CM | POA: Diagnosis not present

## 2019-01-07 DIAGNOSIS — M545 Low back pain: Secondary | ICD-10-CM | POA: Diagnosis not present

## 2019-01-07 DIAGNOSIS — J9611 Chronic respiratory failure with hypoxia: Secondary | ICD-10-CM | POA: Diagnosis not present

## 2019-01-07 DIAGNOSIS — J40 Bronchitis, not specified as acute or chronic: Secondary | ICD-10-CM | POA: Diagnosis not present

## 2019-01-07 DIAGNOSIS — Z9981 Dependence on supplemental oxygen: Secondary | ICD-10-CM | POA: Diagnosis not present

## 2019-01-07 DIAGNOSIS — Z7902 Long term (current) use of antithrombotics/antiplatelets: Secondary | ICD-10-CM | POA: Diagnosis not present

## 2019-01-07 DIAGNOSIS — J438 Other emphysema: Secondary | ICD-10-CM | POA: Diagnosis not present

## 2019-01-07 DIAGNOSIS — I700 Atherosclerosis of aorta: Secondary | ICD-10-CM | POA: Diagnosis not present

## 2019-01-07 DIAGNOSIS — J432 Centrilobular emphysema: Secondary | ICD-10-CM | POA: Diagnosis not present

## 2019-01-07 DIAGNOSIS — G7089 Other specified myoneural disorders: Secondary | ICD-10-CM | POA: Diagnosis not present

## 2019-01-07 DIAGNOSIS — I739 Peripheral vascular disease, unspecified: Secondary | ICD-10-CM | POA: Diagnosis not present

## 2019-01-07 DIAGNOSIS — Z95820 Peripheral vascular angioplasty status with implants and grafts: Secondary | ICD-10-CM | POA: Diagnosis not present

## 2019-01-07 DIAGNOSIS — R1310 Dysphagia, unspecified: Secondary | ICD-10-CM | POA: Diagnosis not present

## 2019-01-07 DIAGNOSIS — G8929 Other chronic pain: Secondary | ICD-10-CM | POA: Diagnosis not present

## 2019-01-07 DIAGNOSIS — J219 Acute bronchiolitis, unspecified: Secondary | ICD-10-CM | POA: Diagnosis not present

## 2019-01-07 LAB — NOVEL CORONAVIRUS, NAA: SARS-CoV-2, NAA: NOT DETECTED

## 2019-01-08 DIAGNOSIS — Z87891 Personal history of nicotine dependence: Secondary | ICD-10-CM | POA: Diagnosis not present

## 2019-01-08 DIAGNOSIS — Z7902 Long term (current) use of antithrombotics/antiplatelets: Secondary | ICD-10-CM | POA: Diagnosis not present

## 2019-01-08 DIAGNOSIS — I2510 Atherosclerotic heart disease of native coronary artery without angina pectoris: Secondary | ICD-10-CM | POA: Diagnosis not present

## 2019-01-08 DIAGNOSIS — J438 Other emphysema: Secondary | ICD-10-CM | POA: Diagnosis not present

## 2019-01-08 DIAGNOSIS — J40 Bronchitis, not specified as acute or chronic: Secondary | ICD-10-CM | POA: Diagnosis not present

## 2019-01-08 DIAGNOSIS — I10 Essential (primary) hypertension: Secondary | ICD-10-CM | POA: Diagnosis not present

## 2019-01-08 DIAGNOSIS — Z9981 Dependence on supplemental oxygen: Secondary | ICD-10-CM | POA: Diagnosis not present

## 2019-01-08 DIAGNOSIS — I700 Atherosclerosis of aorta: Secondary | ICD-10-CM | POA: Diagnosis not present

## 2019-01-08 DIAGNOSIS — J432 Centrilobular emphysema: Secondary | ICD-10-CM | POA: Diagnosis not present

## 2019-01-08 DIAGNOSIS — Z95820 Peripheral vascular angioplasty status with implants and grafts: Secondary | ICD-10-CM | POA: Diagnosis not present

## 2019-01-08 DIAGNOSIS — G8929 Other chronic pain: Secondary | ICD-10-CM | POA: Diagnosis not present

## 2019-01-08 DIAGNOSIS — R911 Solitary pulmonary nodule: Secondary | ICD-10-CM | POA: Diagnosis not present

## 2019-01-08 DIAGNOSIS — J219 Acute bronchiolitis, unspecified: Secondary | ICD-10-CM | POA: Diagnosis not present

## 2019-01-08 DIAGNOSIS — G7089 Other specified myoneural disorders: Secondary | ICD-10-CM | POA: Diagnosis not present

## 2019-01-08 DIAGNOSIS — J690 Pneumonitis due to inhalation of food and vomit: Secondary | ICD-10-CM | POA: Diagnosis not present

## 2019-01-08 DIAGNOSIS — I739 Peripheral vascular disease, unspecified: Secondary | ICD-10-CM | POA: Diagnosis not present

## 2019-01-08 DIAGNOSIS — M545 Low back pain: Secondary | ICD-10-CM | POA: Diagnosis not present

## 2019-01-08 DIAGNOSIS — R1310 Dysphagia, unspecified: Secondary | ICD-10-CM | POA: Diagnosis not present

## 2019-01-08 DIAGNOSIS — J9611 Chronic respiratory failure with hypoxia: Secondary | ICD-10-CM | POA: Diagnosis not present

## 2019-01-12 DIAGNOSIS — Z95820 Peripheral vascular angioplasty status with implants and grafts: Secondary | ICD-10-CM | POA: Diagnosis not present

## 2019-01-12 DIAGNOSIS — G7089 Other specified myoneural disorders: Secondary | ICD-10-CM | POA: Diagnosis not present

## 2019-01-12 DIAGNOSIS — Z87891 Personal history of nicotine dependence: Secondary | ICD-10-CM | POA: Diagnosis not present

## 2019-01-12 DIAGNOSIS — J40 Bronchitis, not specified as acute or chronic: Secondary | ICD-10-CM | POA: Diagnosis not present

## 2019-01-12 DIAGNOSIS — J432 Centrilobular emphysema: Secondary | ICD-10-CM | POA: Diagnosis not present

## 2019-01-12 DIAGNOSIS — Z7902 Long term (current) use of antithrombotics/antiplatelets: Secondary | ICD-10-CM | POA: Diagnosis not present

## 2019-01-12 DIAGNOSIS — I10 Essential (primary) hypertension: Secondary | ICD-10-CM | POA: Diagnosis not present

## 2019-01-12 DIAGNOSIS — J219 Acute bronchiolitis, unspecified: Secondary | ICD-10-CM | POA: Diagnosis not present

## 2019-01-12 DIAGNOSIS — Z9981 Dependence on supplemental oxygen: Secondary | ICD-10-CM | POA: Diagnosis not present

## 2019-01-12 DIAGNOSIS — I2510 Atherosclerotic heart disease of native coronary artery without angina pectoris: Secondary | ICD-10-CM | POA: Diagnosis not present

## 2019-01-12 DIAGNOSIS — J690 Pneumonitis due to inhalation of food and vomit: Secondary | ICD-10-CM | POA: Diagnosis not present

## 2019-01-12 DIAGNOSIS — G8929 Other chronic pain: Secondary | ICD-10-CM | POA: Diagnosis not present

## 2019-01-12 DIAGNOSIS — I739 Peripheral vascular disease, unspecified: Secondary | ICD-10-CM | POA: Diagnosis not present

## 2019-01-12 DIAGNOSIS — J438 Other emphysema: Secondary | ICD-10-CM | POA: Diagnosis not present

## 2019-01-12 DIAGNOSIS — J9611 Chronic respiratory failure with hypoxia: Secondary | ICD-10-CM | POA: Diagnosis not present

## 2019-01-12 DIAGNOSIS — M545 Low back pain: Secondary | ICD-10-CM | POA: Diagnosis not present

## 2019-01-12 DIAGNOSIS — R1310 Dysphagia, unspecified: Secondary | ICD-10-CM | POA: Diagnosis not present

## 2019-01-12 DIAGNOSIS — I700 Atherosclerosis of aorta: Secondary | ICD-10-CM | POA: Diagnosis not present

## 2019-01-12 DIAGNOSIS — R911 Solitary pulmonary nodule: Secondary | ICD-10-CM | POA: Diagnosis not present

## 2019-01-13 DIAGNOSIS — J40 Bronchitis, not specified as acute or chronic: Secondary | ICD-10-CM | POA: Diagnosis not present

## 2019-01-13 DIAGNOSIS — Z7902 Long term (current) use of antithrombotics/antiplatelets: Secondary | ICD-10-CM | POA: Diagnosis not present

## 2019-01-13 DIAGNOSIS — M545 Low back pain: Secondary | ICD-10-CM | POA: Diagnosis not present

## 2019-01-13 DIAGNOSIS — I2510 Atherosclerotic heart disease of native coronary artery without angina pectoris: Secondary | ICD-10-CM | POA: Diagnosis not present

## 2019-01-13 DIAGNOSIS — I700 Atherosclerosis of aorta: Secondary | ICD-10-CM | POA: Diagnosis not present

## 2019-01-13 DIAGNOSIS — Z95820 Peripheral vascular angioplasty status with implants and grafts: Secondary | ICD-10-CM | POA: Diagnosis not present

## 2019-01-13 DIAGNOSIS — I739 Peripheral vascular disease, unspecified: Secondary | ICD-10-CM | POA: Diagnosis not present

## 2019-01-13 DIAGNOSIS — J9611 Chronic respiratory failure with hypoxia: Secondary | ICD-10-CM | POA: Diagnosis not present

## 2019-01-13 DIAGNOSIS — J690 Pneumonitis due to inhalation of food and vomit: Secondary | ICD-10-CM | POA: Diagnosis not present

## 2019-01-13 DIAGNOSIS — Z9981 Dependence on supplemental oxygen: Secondary | ICD-10-CM | POA: Diagnosis not present

## 2019-01-13 DIAGNOSIS — J219 Acute bronchiolitis, unspecified: Secondary | ICD-10-CM | POA: Diagnosis not present

## 2019-01-13 DIAGNOSIS — R911 Solitary pulmonary nodule: Secondary | ICD-10-CM | POA: Diagnosis not present

## 2019-01-13 DIAGNOSIS — J432 Centrilobular emphysema: Secondary | ICD-10-CM | POA: Diagnosis not present

## 2019-01-13 DIAGNOSIS — R1310 Dysphagia, unspecified: Secondary | ICD-10-CM | POA: Diagnosis not present

## 2019-01-13 DIAGNOSIS — G7089 Other specified myoneural disorders: Secondary | ICD-10-CM | POA: Diagnosis not present

## 2019-01-13 DIAGNOSIS — J438 Other emphysema: Secondary | ICD-10-CM | POA: Diagnosis not present

## 2019-01-13 DIAGNOSIS — I10 Essential (primary) hypertension: Secondary | ICD-10-CM | POA: Diagnosis not present

## 2019-01-13 DIAGNOSIS — Z87891 Personal history of nicotine dependence: Secondary | ICD-10-CM | POA: Diagnosis not present

## 2019-01-13 DIAGNOSIS — G8929 Other chronic pain: Secondary | ICD-10-CM | POA: Diagnosis not present

## 2019-01-19 ENCOUNTER — Other Ambulatory Visit: Payer: Self-pay | Admitting: Family

## 2019-01-20 DIAGNOSIS — Z95820 Peripheral vascular angioplasty status with implants and grafts: Secondary | ICD-10-CM | POA: Diagnosis not present

## 2019-01-20 DIAGNOSIS — M545 Low back pain: Secondary | ICD-10-CM | POA: Diagnosis not present

## 2019-01-20 DIAGNOSIS — G8929 Other chronic pain: Secondary | ICD-10-CM | POA: Diagnosis not present

## 2019-01-20 DIAGNOSIS — J438 Other emphysema: Secondary | ICD-10-CM | POA: Diagnosis not present

## 2019-01-20 DIAGNOSIS — J690 Pneumonitis due to inhalation of food and vomit: Secondary | ICD-10-CM | POA: Diagnosis not present

## 2019-01-20 DIAGNOSIS — J9611 Chronic respiratory failure with hypoxia: Secondary | ICD-10-CM | POA: Diagnosis not present

## 2019-01-20 DIAGNOSIS — I10 Essential (primary) hypertension: Secondary | ICD-10-CM | POA: Diagnosis not present

## 2019-01-20 DIAGNOSIS — G7089 Other specified myoneural disorders: Secondary | ICD-10-CM | POA: Diagnosis not present

## 2019-01-20 DIAGNOSIS — J432 Centrilobular emphysema: Secondary | ICD-10-CM | POA: Diagnosis not present

## 2019-01-20 DIAGNOSIS — I700 Atherosclerosis of aorta: Secondary | ICD-10-CM | POA: Diagnosis not present

## 2019-01-20 DIAGNOSIS — J40 Bronchitis, not specified as acute or chronic: Secondary | ICD-10-CM | POA: Diagnosis not present

## 2019-01-20 DIAGNOSIS — R911 Solitary pulmonary nodule: Secondary | ICD-10-CM | POA: Diagnosis not present

## 2019-01-20 DIAGNOSIS — Z9981 Dependence on supplemental oxygen: Secondary | ICD-10-CM | POA: Diagnosis not present

## 2019-01-20 DIAGNOSIS — Z7902 Long term (current) use of antithrombotics/antiplatelets: Secondary | ICD-10-CM | POA: Diagnosis not present

## 2019-01-20 DIAGNOSIS — J219 Acute bronchiolitis, unspecified: Secondary | ICD-10-CM | POA: Diagnosis not present

## 2019-01-20 DIAGNOSIS — Z87891 Personal history of nicotine dependence: Secondary | ICD-10-CM | POA: Diagnosis not present

## 2019-01-20 DIAGNOSIS — R1310 Dysphagia, unspecified: Secondary | ICD-10-CM | POA: Diagnosis not present

## 2019-01-20 DIAGNOSIS — I2510 Atherosclerotic heart disease of native coronary artery without angina pectoris: Secondary | ICD-10-CM | POA: Diagnosis not present

## 2019-01-20 DIAGNOSIS — I739 Peripheral vascular disease, unspecified: Secondary | ICD-10-CM | POA: Diagnosis not present

## 2019-01-24 DIAGNOSIS — J441 Chronic obstructive pulmonary disease with (acute) exacerbation: Secondary | ICD-10-CM | POA: Diagnosis not present

## 2019-01-24 DIAGNOSIS — J9621 Acute and chronic respiratory failure with hypoxia: Secondary | ICD-10-CM | POA: Diagnosis not present

## 2019-01-25 DIAGNOSIS — J441 Chronic obstructive pulmonary disease with (acute) exacerbation: Secondary | ICD-10-CM | POA: Diagnosis not present

## 2019-01-25 DIAGNOSIS — J9621 Acute and chronic respiratory failure with hypoxia: Secondary | ICD-10-CM | POA: Diagnosis not present

## 2019-01-28 DIAGNOSIS — M5431 Sciatica, right side: Secondary | ICD-10-CM | POA: Diagnosis not present

## 2019-01-28 DIAGNOSIS — Z79891 Long term (current) use of opiate analgesic: Secondary | ICD-10-CM | POA: Diagnosis not present

## 2019-01-28 DIAGNOSIS — M545 Low back pain: Secondary | ICD-10-CM | POA: Diagnosis not present

## 2019-01-28 DIAGNOSIS — G894 Chronic pain syndrome: Secondary | ICD-10-CM | POA: Diagnosis not present

## 2019-01-28 DIAGNOSIS — M79604 Pain in right leg: Secondary | ICD-10-CM | POA: Diagnosis not present

## 2019-01-28 DIAGNOSIS — G8929 Other chronic pain: Secondary | ICD-10-CM | POA: Diagnosis not present

## 2019-01-28 DIAGNOSIS — M79605 Pain in left leg: Secondary | ICD-10-CM | POA: Diagnosis not present

## 2019-02-03 ENCOUNTER — Ambulatory Visit (INDEPENDENT_AMBULATORY_CARE_PROVIDER_SITE_OTHER): Payer: Medicare Other | Admitting: Internal Medicine

## 2019-02-03 ENCOUNTER — Encounter: Payer: Self-pay | Admitting: Internal Medicine

## 2019-02-03 ENCOUNTER — Ambulatory Visit (INDEPENDENT_AMBULATORY_CARE_PROVIDER_SITE_OTHER): Payer: Medicare Other

## 2019-02-03 ENCOUNTER — Other Ambulatory Visit: Payer: Self-pay

## 2019-02-03 DIAGNOSIS — J449 Chronic obstructive pulmonary disease, unspecified: Secondary | ICD-10-CM | POA: Diagnosis not present

## 2019-02-03 DIAGNOSIS — R918 Other nonspecific abnormal finding of lung field: Secondary | ICD-10-CM | POA: Diagnosis not present

## 2019-02-03 DIAGNOSIS — J9611 Chronic respiratory failure with hypoxia: Secondary | ICD-10-CM

## 2019-02-03 DIAGNOSIS — J439 Emphysema, unspecified: Secondary | ICD-10-CM | POA: Diagnosis not present

## 2019-02-03 DIAGNOSIS — J189 Pneumonia, unspecified organism: Secondary | ICD-10-CM | POA: Diagnosis not present

## 2019-02-03 MED ORDER — IPRATROPIUM-ALBUTEROL 0.5-2.5 (3) MG/3ML IN SOLN: 3.0000 mL | Freq: Four times a day (QID) | RESPIRATORY_TRACT | 11 refills | Status: DC | PRN

## 2019-02-03 MED ORDER — PREDNISONE 10 MG PO TABS: ORAL_TABLET | ORAL | 0 refills | Status: DC

## 2019-02-03 NOTE — Patient Instructions (Addendum)
duoneb is up to 4x daily as needed    02 2lpm at bedtime and use during the day to keep the level above 90%    Please remember to go to the  x-ray department  for your tests - we will call you with the results when they are available    Please schedule a follow up office visit in 4 weeks, sooner if needed  with all medications /inhalers/ solutions in hand so we can verify exactly what you are taking. This includes all medications from all doctors and over the counters  add needs alpha one screen next ov - add pred x 6 d prn flare
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
## 2019-02-04 NOTE — Progress Notes (Signed)
Spoke with pt and notified of results per Dr. Wert. Pt verbalized understanding and denied any questions. 

## 2019-02-05 ENCOUNTER — Encounter: Payer: Self-pay | Admitting: Internal Medicine

## 2019-02-05 DIAGNOSIS — J9611 Chronic respiratory failure with hypoxia: Secondary | ICD-10-CM | POA: Insufficient documentation

## 2019-02-05 NOTE — Assessment & Plan Note (Addendum)
Quit smoking 2018 Spirometry 04/30/2016  FEV1 1.23 (49%)  Ratio 67 - 04/30/2016  Walked RA x 3 laps @ 185 ft each stopped due to  End of study, fast pace pace, sob with desats at 88%    - 04/30/2016 rec trial of BREO > no better - PFT's  06/18/2016  FEV1 1.44 (49 % ) ratio 56  p 9 % improvement from saba p BREO prior to study with DLCO  37/38 % corrects to 41  % for alv volume   - 02/03/2019  After extensive coaching inhaler device,  effectiveness =    75% but can't afford lama/laba so rec duoneb qid  DDX of  difficult airways management almost all start with A and  include Adherence, Ace Inhibitors, Acid Reflux, Active Sinus Disease, Alpha 1 Antitripsin deficiency, Anxiety masquerading as Airways dz,  ABPA,  Allergy(esp in young), Aspiration (esp in elderly), Adverse effects of meds,  Active smoking or vaping, A bunch of PE's (a small clot burden can't cause this syndrome unless there is already severe underlying pulm or vascular dz with poor reserve) plus two Bs  = Bronchiectasis and Beta blocker use..and one C= CHF  Adherence is always the initial "prime suspect" and is a multilayered concern that requires a "trust but verify" approach in every patient - starting with knowing how to use medications, especially inhalers, correctly, keeping up with refills and understanding the fundamental difference between maintenance and prns vs those medications only taken for a very short course and then stopped and not refilled.  - see hfa teaching - Group D in terms of symptom/risk and laba/lama/ICS  therefore appropriate rx at this point >>>  But can't afford for now so cheapest option is qid duoneb and f/u in 4 weeks   ? Aspiration/ recurrent > MBS needed if recurrent   ? Allergy/asthma component > pred x 6 days prn flare on duoneb  ? Alpha one def > send screen at f/u   ? Bronchiectasis developing >  Would need hrct to exclude, rx same for now

## 2019-02-05 NOTE — Assessment & Plan Note (Signed)
As of 02/03/2019  rec 2lpm hs and titrate daytime to keep > 90% RA   I had an extended discussion with the patient reviewing all relevant studies completed to date and  lasting 25 minutes of a 40  minute post hosp f/u office visit /transition of care     re  severe non-specific but potentially very serious refractory respiratory symptoms of uncertain and potentially multiple  Etiologies.  See device teaching which extended face to face time for this visit   Each maintenance medication was reviewed in detail including most importantly the difference between maintenance and prns and under what circumstances the prns are to be triggered using an action plan format that is not reflected in the computer generated alphabetically organized AVS.    Please see AVS for specific instructions unique to this office visit that I personally wrote and verbalized to the the pt in detail and then reviewed with pt  by my nurse highlighting any changes in therapy/plan of care  recommended at today's visit.

## 2019-02-19 ENCOUNTER — Other Ambulatory Visit: Payer: Self-pay | Admitting: Family Medicine

## 2019-02-24 DIAGNOSIS — G894 Chronic pain syndrome: Secondary | ICD-10-CM | POA: Diagnosis not present

## 2019-02-24 DIAGNOSIS — J9621 Acute and chronic respiratory failure with hypoxia: Secondary | ICD-10-CM | POA: Diagnosis not present

## 2019-02-24 DIAGNOSIS — M5431 Sciatica, right side: Secondary | ICD-10-CM | POA: Diagnosis not present

## 2019-02-24 DIAGNOSIS — M545 Low back pain: Secondary | ICD-10-CM | POA: Diagnosis not present

## 2019-02-24 DIAGNOSIS — Z79891 Long term (current) use of opiate analgesic: Secondary | ICD-10-CM | POA: Diagnosis not present

## 2019-02-24 DIAGNOSIS — J441 Chronic obstructive pulmonary disease with (acute) exacerbation: Secondary | ICD-10-CM | POA: Diagnosis not present

## 2019-02-24 DIAGNOSIS — M79604 Pain in right leg: Secondary | ICD-10-CM | POA: Diagnosis not present

## 2019-02-24 DIAGNOSIS — G8929 Other chronic pain: Secondary | ICD-10-CM | POA: Diagnosis not present

## 2019-02-24 DIAGNOSIS — M79605 Pain in left leg: Secondary | ICD-10-CM | POA: Diagnosis not present

## 2019-02-26 ENCOUNTER — Telehealth (HOSPITAL_COMMUNITY): Payer: Self-pay

## 2019-02-26 ENCOUNTER — Other Ambulatory Visit: Payer: Self-pay

## 2019-02-26 DIAGNOSIS — I714 Abdominal aortic aneurysm, without rupture, unspecified: Secondary | ICD-10-CM

## 2019-02-26 DIAGNOSIS — I779 Disorder of arteries and arterioles, unspecified: Secondary | ICD-10-CM

## 2019-02-26 NOTE — Telephone Encounter (Signed)
The above patient or their representative was contacted and gave the following answers to these questions:         Do you have any of the following symptoms? No  Fever                    Cough                   Shortness of breath  Do  you have any of the following other symptoms?  no   muscle pain         vomiting,        diarrhea        rash         weakness        red eye        abdominal pain         bruising          bruising or bleeding              joint pain           severe headache    Have you been in contact with someone who was or has been sick in the past 2 weeks? No  Yes                 Unsure                         Unable to assess   Does the person that you were in contact with have any of the following symptoms?   Cough         shortness of breath           muscle pain         vomiting,            diarrhea            rash            weakness           fever            red eye           abdominal pain           bruising  or  bleeding                joint pain                severe headache               Have you  or someone you have been in contact with traveled internationally in th last month?   No       If yes, which countries?   Have you  or someone you have been in contact with traveled outside [place] in th last month?  No        If yes, which state and city?   COMMENTS OR ACTION PLAN FOR THIS PATIENT:

## 2019-03-01 ENCOUNTER — Ambulatory Visit (HOSPITAL_COMMUNITY)
Admission: RE | Admit: 2019-03-01 | Discharge: 2019-03-01 | Disposition: A | Discharge status: Home / Self Care | Payer: Medicare Other | Source: Ambulatory Visit | Attending: Family | Admitting: Family

## 2019-03-01 ENCOUNTER — Other Ambulatory Visit: Payer: Self-pay

## 2019-03-01 DIAGNOSIS — I779 Disorder of arteries and arterioles, unspecified: Secondary | ICD-10-CM | POA: Diagnosis not present

## 2019-03-04 ENCOUNTER — Telehealth: Payer: Self-pay | Admitting: *Deleted

## 2019-03-04 ENCOUNTER — Ambulatory Visit (INDEPENDENT_AMBULATORY_CARE_PROVIDER_SITE_OTHER): Payer: Medicare Other | Admitting: Internal Medicine

## 2019-03-04 ENCOUNTER — Encounter: Payer: Self-pay | Admitting: Internal Medicine

## 2019-03-04 ENCOUNTER — Other Ambulatory Visit: Payer: Self-pay

## 2019-03-04 DIAGNOSIS — J9611 Chronic respiratory failure with hypoxia: Secondary | ICD-10-CM

## 2019-03-04 DIAGNOSIS — J449 Chronic obstructive pulmonary disease, unspecified: Secondary | ICD-10-CM

## 2019-03-04 LAB — BASIC METABOLIC PANEL
BUN: 10 mg/dL (ref 6–23)
CO2: 29 mEq/L (ref 19–32)
Calcium: 9.4 mg/dL (ref 8.4–10.5)
Chloride: 100 mEq/L (ref 96–112)
Creatinine, Ser: 0.98 mg/dL (ref 0.40–1.50)
GFR: 75.03 mL/min (ref >60.00)
Glucose, Bld: 125 mg/dL — ABNORMAL HIGH (ref 70–99)
Potassium: 4.6 mEq/L (ref 3.5–5.1)
Sodium: 138 mEq/L (ref 135–145)

## 2019-03-04 LAB — CBC WITH DIFFERENTIAL/PLATELET
Basophils Absolute: 0.1 10*3/uL (ref 0.0–0.1)
Basophils Relative: 1.3 % (ref 0.0–3.0)
Eosinophils Absolute: 0.3 10*3/uL (ref 0.0–0.7)
Eosinophils Relative: 2.9 % (ref 0.0–5.0)
HCT: 43.3 % (ref 39.0–52.0)
Hemoglobin: 14.3 g/dL (ref 13.0–17.0)
Lymphocytes Relative: 8.7 % — ABNORMAL LOW (ref 12.0–46.0)
Lymphs Abs: 0.8 10*3/uL (ref 0.7–4.0)
MCHC: 33 g/dL (ref 30.0–36.0)
MCV: 92.2 fl (ref 78.0–100.0)
Monocytes Absolute: 0.8 10*3/uL (ref 0.1–1.0)
Monocytes Relative: 8.6 % (ref 3.0–12.0)
Neutro Abs: 7.1 10*3/uL (ref 1.4–7.7)
Neutrophils Relative %: 78.5 % — ABNORMAL HIGH (ref 43.0–77.0)
Platelets: 366 10*3/uL (ref 150.0–400.0)
RBC: 4.69 Mil/uL (ref 4.22–5.81)
RDW: 15.4 % (ref 11.5–15.5)
WBC: 9 10*3/uL (ref 4.0–10.5)

## 2019-03-04 MED ORDER — PREDNISONE 10 MG PO TABS: ORAL_TABLET | ORAL | 11 refills | Status: DC

## 2019-03-04 NOTE — Assessment & Plan Note (Signed)
As of 02/03/2019  rec 2lpm hs and titrate daytime to keep > 90% RA  Adequate control on present rx, reviewed in detail with pt > no change in rx needed     Each maintenance medication was reviewed in detail including most importantly the difference between maintenance and as needed and under what circumstances the prns are to be used.  Please see AVS for specific  Instructions which are unique to this visit and I personally typed out  which were reviewed in detail in writing with the patient and a copy provided.

## 2019-03-04 NOTE — Telephone Encounter (Signed)
Virtual Visit Pre-Appointment Phone Call  Today, I spoke with Christopher Pena and performed the following actions:  1. I explained that we are currently trying to limit exposure to the COVID-19 virus by seeing patients at home rather than in the office.  I explained that the visits are best done by video, but can be done by telephone.  I asked the patient if a virtual visit that the patient would like to try instead of coming into the office. Christopher Pena agreed to proceed with the virtual visit scheduled with [name] on 03/04/19.      2. I confirmed the BEST phone number to call the day of the visit and- I included this in appointment notes.  3. I asked if the patient had access to (through a family member/friend) a smartphone with video capability to be used for his visit?"  The patient said yes -   4. I confirmed consent by  a. sending through [place] or by email the [place] as written at the end of this message or  b. verbally as listed below. i. This visit is being performed in the setting of COVID-19. ii. All virtual visits are billed to your insurance company just like a normal visit would be.   iii. We'd like you to understand that the technology does not allow for your provider to perform an examination, and thus may limit your provider's ability to fully assess your condition.  iv. If your provider identifies any concerns that need to be evaluated in person, we will make arrangements to do so.   v. Finally, though the technology is pretty good, we cannot assure that it will always work on either your or our end, and in the setting of a video visit, we may have to convert it to a phone-only visit.  In either situation, we cannot ensure that we have a secure connection.   vi. Are you willing to proceed?"  STAFF: Did the patient verbally acknowledge consent to telehealth visit? Document YES/NO here:  2. I advised the patient to be prepared  - I asked that the patient, on the day of his visit, record any information possible with the equipment at his home, such as blood pressure, pulse, oxygen saturation, and your weight and write them all down. I asked the patient to have a pen and paper handy nearby the day of the visit as well.  3. If the patient was scheduled for a video visit, I informed the patient that the visit with the doctor would start with a text to the smartphone # given to [place] by the patient.         If the patient was scheduled for a telephone call, I informed the patient that the visit with the doctor would start with a call to the telephone # given to [place] by the patient.  4. I Informed patient they will receive a phone call 15 minutes prior to their appointment time from a [place] or nurse to review medications, allergies, etc. to prepare for the visit.    TELEPHONE CALL NOTE  [name] has been deemed a candidate for a follow-up tele-health visit to limit community exposure during the Covid-19 pandemic. I spoke with the patient via phone to ensure availability of phone/video source, confirm preferred email & phone number, and discuss instructions and expectations.  I reminded Christopher Pena to be prepared with any vital sign and/or heart  rhythm information that could potentially be obtained via home monitoring, at the time of his visit. I reminded Christopher Pena to expect a phone call prior to his visit.  Christopher Pena, Christopher Pena M, NT 03/04/2019 10:57 AM     FULL LENGTH CONSENT FOR TELE-HEALTH VISIT   I hereby voluntarily request, consent and authorize [facility] and its employed or contracted physicians, physician assistants, nurse practitioners or other licensed health care professionals (the Practitioner), to provide me with telemedicine health care services (the "Services") as deemed necessary by the treating Practitioner. I acknowledge and consent to receive the Services by the Practitioner via telemedicine. I  understand that the telemedicine visit will involve communicating with the Practitioner through live audiovisual communication technology and the disclosure of certain medical information by electronic transmission. I acknowledge that I have been given the opportunity to request an in-person assessment or other available alternative prior to the telemedicine visit and am voluntarily participating in the telemedicine visit.  I understand that I have the right to withhold or withdraw my consent to the use of telemedicine in the course of my care at any time, without affecting my right to future care or treatment, and that the Practitioner or I may terminate the telemedicine visit at any time. I understand that I have the right to inspect all information obtained and/or recorded in the course of the telemedicine visit and may receive copies of available information for a reasonable fee.  I understand that some of the potential risks of receiving the Services via telemedicine include:  Christopher Pena. Delay or interruption in medical evaluation due to technological equipment failure or disruption; . Information transmitted may not be sufficient (e.g. poor resolution of images) to allow for appropriate medical decision making by the Practitioner; and/or  . In rare instances, security protocols could fail, causing a breach of personal health information.  Furthermore, I acknowledge that it is my responsibility to provide information about my medical history, conditions and care that is complete and accurate to the best of my ability. I acknowledge that Practitioner's advice, recommendations, and/or decision may be based on factors not within their control, such as incomplete or inaccurate data provided by me or distortions of diagnostic images or specimens that may result from electronic transmissions. I understand that the practice of medicine is not an exact science and that Practitioner makes no warranties or guarantees  regarding treatment outcomes. I acknowledge that I will receive a copy of this consent concurrently upon execution via email to the email address I last provided but may also request a printed copy by calling the office of [facility].    I understand that my insurance will be billed for this visit.   I have read or had this consent read to me. . I understand the contents of this consent, which adequately explains the benefits and risks of the Services being provided via telemedicine.  . I have been provided ample opportunity to ask questions regarding this consent and the Services and have had my questions answered to my satisfaction. . I give my informed consent for the services to be provided through the use of telemedicine in my medical care  By participating in this telemedicine visit I agree to the above.

## 2019-03-04 NOTE — Assessment & Plan Note (Addendum)
Quit smoking 2018 Spirometry 04/30/2016  FEV1 1.23 (49%)  Ratio 67 - 04/30/2016  Walked RA x 3 laps @ 185 ft each stopped due to  End of study, fast pace pace, sob with desats at 88%    - 04/30/2016 rec trial of BREO > no better - PFT's  06/18/2016  FEV1 1.44 (49 % ) ratio 56  p 9 % improvement from saba p BREO prior to study with DLCO  37/38 % corrects to 41  % for alv volume   - 02/03/2019  After extensive coaching inhaler device,  effectiveness =    75% but can't afford lama/laba so rec duoneb qid and have on hand pred x 6 d for flares - alpha one AT Screen 03/04/2019    Doing relatively well  on only duoneb bid though would prefer lama/laba or lama/laba/ics in this setting > can increase duoneb to qid and then add budesonide if having flares req prednisone prn

## 2019-03-04 NOTE — Patient Instructions (Signed)
Continue Duoneb up to 4 x daily   If not satisfied or you are losing ground, take 6 days of pred (refillable)  Keep your 02 sats above 90% while walking, not needed sitting still   Please remember to go to the lab department   for your tests - we will call you with the results when they are available.      Please schedule a follow up visit in 6 months but call sooner if needed
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
## 2019-03-05 ENCOUNTER — Ambulatory Visit (INDEPENDENT_AMBULATORY_CARE_PROVIDER_SITE_OTHER): Payer: Medicare Other | Admitting: Family

## 2019-03-05 ENCOUNTER — Other Ambulatory Visit: Payer: Self-pay

## 2019-03-05 ENCOUNTER — Encounter: Payer: Self-pay | Admitting: Family

## 2019-03-05 VITALS — BP 142/74 | Ht 67.0 in | Wt 170.0 lb

## 2019-03-05 DIAGNOSIS — I714 Abdominal aortic aneurysm, without rupture, unspecified: Secondary | ICD-10-CM

## 2019-03-05 DIAGNOSIS — Z95828 Presence of other vascular implants and grafts: Secondary | ICD-10-CM | POA: Diagnosis not present

## 2019-03-05 DIAGNOSIS — I779 Disorder of arteries and arterioles, unspecified: Secondary | ICD-10-CM

## 2019-03-05 DIAGNOSIS — Z87891 Personal history of nicotine dependence: Secondary | ICD-10-CM | POA: Diagnosis not present

## 2019-03-05 NOTE — Progress Notes (Signed)
Virtual Visit via Telephone Note   I connected with Sandria BalesDavid H Oliva on 03/05/2019 using the Doxy.me by telephone and verified that I was speaking with the correct person using two identifiers. Patient was located at his home and accompanied by himself. I am located at the [facility] office.   The limitations of evaluation and management by telemedicine and the availability of in person appointments have been previously discussed with the patient and are documented in the patients chart. The patient expressed understanding and consented to proceed.  PCP: Sheliah Hatchabori, Katherine E, MD   Chief Complaint: follow up peripheral artery occlusive disease   History of Present Illness: Christopher GageDavid H Erisman is a 73 y.o. male who is status post left femoral to anterior tibial bypass graft on 12/10/2015. This was done for a wound on his left great toe. He has a history of a right femoral to below-knee popliteal bypass graft with vein which has occluded. He was seen on 04/15/2016 and his bypass graft was patent.On 05/20/2016 he came back in the office without a pulse in his bypass graft. He stated that hadbeen going on for approximately 3 weeks. He had also developed a wound on his great toe. On 05/21/2016 he was taken to the operating room for a thrombectomy of his superficial femoral anterior tibial bypass graft. Dr. Myra GianottiBrabham was able to get the bypass graft open, however the vein appeared to be sclerotic.  Dr. Rainey PinesBrabhamperformed angioplasty blindly in the operating room. The following day the patientcontinued to have a pulse within the graft and therefore he wastaken to the Angio suite for arteriogram and additional balloon angioplasty with a 4 mm balloon. He was discharged home on Plavix.  He was scheduled for angiography to evaluate his bypass graft, however when he arrived it was found to be occluded. Because the vein was of such poor quality, Dr. Derrill CenterBrabhamelected to redo his bypass graft, this time using  PTFE down from the common femoral to the anterior tibial artery on 09-20-16. Dr. Myra GianottiBrabham placedretention sutures atthe lateral lower leg because hewas worried about swelling and tension in this area.  He has numbness at the anterior aspect of his right calf, states since right leg surgery. He sees a pain management clinic, [facility], for chronic DDD and associated pain.  He states that the pain management clinic stopped the xanax in 2017, he took for about 6-7 years, andlaterhe started having tremors in his head and hands.  About late June of 2018 he started having left calf claudication after walking about 50 yards.  At this point he walks about 50 feet and both calves hurt, relieved with rest,  But more recently it takes a long time for his left calf pain t resolve with rest.  He was started on cilostizol at his visit on 03-17-17 to help relive claudication sx's. His walking is limited by his dyspnea and claudication.   He stopped smoking 11-13-16, states because he had too much trouble breathing. He denies a hx of DM. The patient's wounds are healed. The patient is able to complete their activities of daily living.  He denies any hx of stroke or TIA.  Diabetic:No Tobacco ZOX:WRUEAVuse:former smoker, quitMarch 2018, started smoking at age [age]years  Pt meds include: Statin :Yes Betablocker:No ASA:No Other anticoagulants/antiplatelets:Plavix    Past Medical History:  Diagnosis Date   Anxiety    Chronic low back pain    "degenerative spine dx'd ~ 7 yr ago" (06/08/2013)   Claudication (HCC)  Constipation due to pain medication    COPD (chronic obstructive pulmonary disease) (HCC)    Hypertension    "moderately high; RX didn't help" (06/08/2013) - no longer on meds (as of 09/19/16)   Neuromuscular disorder (HCC)    degen spine   Non-healing wound of lower extremity 06/09/2013   PAD (peripheral artery disease), PTA/stent IDEV & chocolate baloon 06/08/13  04/13/2013   Severely reduced ABI's of 0.3 bilaterally    Tobacco use 06/09/2013   Unexplained weight loss     Past Surgical History:  Procedure Laterality Date   ABDOMINAL AORTAGRAM  05/10/2013   Procedure: ABDOMINAL Ronny FlurryAORTAGRAM;  Surgeon: Runell GessJonathan J Berry, MD;  [facility];  Service: Cardiovascular;;   ABI     04/09/13; abnormal   ANGIOPLASTY Left 05/21/2016   Procedure: ballon ANGIOPLASTY of femoral to anterior to tibial bypass graft;  Surgeon: Nada LibmanVance W Brabham, MD;  [facility];  Service: Vascular;  Laterality: Left;   APPENDECTOMY  05/2001   FEMORAL ARTERY STENT Right 05/10/13   2 stents Rt SFA   FEMORAL ARTERY STENT Left 06/08/2013   FEMORAL-POPLITEAL BYPASS GRAFT Right 11/12/2013   Procedure: RIGHT  FEMORAL-BELOW KNEE POPLITEAL ARTERY BYPASS GRAFT USING NON- REVERSE RIGHT GREATER SAPHENOUS VEIN.;  Surgeon: Nada LibmanVance W Brabham, MD;  [facility];  Service: Vascular;  Laterality: Right;   FEMORAL-TIBIAL BYPASS GRAFT Left 12/08/2015   Procedure:  LEFT FEMORAL-ANTERIOR TIBIAL ARTERY BYPASS GRAFT;  Surgeon: Nada LibmanVance W Brabham, MD;  [facility]: [facility] OR;  Service: Vascular;  Laterality: Left;   FEMORAL-TIBIAL BYPASS GRAFT Left 09/20/2016   Procedure: REDO BYPASS GRAFT FEMORAL-ANTERIOR TIBIAL ARTERY;  Surgeon: Nada LibmanVance W Brabham, MD;  [facility]: [facility];  Service: Vascular;  Laterality: Left;   LOWER EXTREMITY ANGIOGRAM Bilateral 05/10/2013   Procedure: LOWER EXTREMITY ANGIOGRAM;  Surgeon: Runell GessJonathan J Berry, MD;  [facility];  Service: Cardiovascular;  Laterality: Bilateral;   LOWER EXTREMITY ANGIOGRAM N/A 06/08/2013   Procedure: LOWER EXTREMITY ANGIOGRAM;  Surgeon: Runell GessJonathan J Berry, MD;  [facility];  Service: Cardiovascular;  Laterality: N/A;   LOWER EXTREMITY ANGIOGRAM N/A 10/28/2013   Procedure: LOWER EXTREMITY ANGIOGRAM;  Surgeon: Runell GessJonathan J Berry, MD;  [facility];  Service: Cardiovascular;  Laterality: N/A;   LOWER EXTREMITY ANGIOGRAM N/A 05/16/2014    Procedure: LOWER EXTREMITY ANGIOGRAM;  Surgeon: Runell GessJonathan J Berry, MD;  [facility];  Service: Cardiovascular;  Laterality: N/A;   PERCUTANEOUS STENT INTERVENTION Right 05/10/2013   Procedure: PERCUTANEOUS STENT INTERVENTION;  Surgeon: Runell GessJonathan J Berry, MD;  [facility];  Service: Cardiovascular;  Laterality: Right;  rt sfa and popliteal stent x2   PERIPHERAL VASCULAR CATHETERIZATION N/A 12/06/2015   Procedure: Abdominal Aortogram w/Lower Extremity;  Surgeon: Nada LibmanVance W Brabham, MD;  [facility] IN[facility];  Service: Cardiovascular;  Laterality: N/A;   PERIPHERAL VASCULAR CATHETERIZATION N/A 05/22/2016   Procedure: Abdominal Aortogram w/Lower Extremity;  Surgeon: Maeola HarmanBrandon Christopher Cain, MD;  [facility];  Service: Cardiovascular;  Laterality: N/A;   PERIPHERAL VASCULAR CATHETERIZATION N/A 09/17/2016   Procedure: Abdominal Aortogram w/Lower Extremity;  Surgeon: Nada LibmanVance W Brabham, MD;  [facility];  Service: Cardiovascular;  Laterality: N/A;   THROMBECTOMY FEMORAL ARTERY Left 05/21/2016   Procedure: THROMBECTOMY FEMORAL TO ANTERIOR TIBIAL BYPASS GRAFT;  Surgeon: Nada LibmanVance W Brabham, MD;  [facility];  Service: Vascular;  Laterality: Left;   VEIN HARVEST Left 12/08/2015   Procedure: USING NON REVERSE  LEFT GREATER SAPHENOUS VEIN;  Surgeon: Serafina Mitchell, MD;  Location: [facility] OR;  Service: Vascular;  Laterality: Left;    Current Meds  Medication Sig   albuterol (VENTOLIN HFA) 108 (90 Base) MCG/ACT inhaler Inhale 2 puffs into the lungs every 6 (six) hours as needed for wheezing or shortness of breath.   busPIRone (BUSPAR) 7.5 MG tablet TAKE 1 TABLET BY MOUTH TWICE A DAY   cilostazol (PLETAL) 100 MG tablet Take 100 mg by mouth 2 (two) times daily.   clopidogrel (PLAVIX) 75 MG tablet TAKE 1 TABLET BY MOUTH EVERY DAY   ipratropium-albuterol (DUONEB) 0.5-2.5 (3) MG/3ML SOLN Take 3 mLs by nebulization every 6 (six) hours as needed.   oxyCODONE  (ROXICODONE) 15 MG immediate release tablet Take 15 mg by mouth every 4 (four) hours.   predniSONE (DELTASONE) 10 MG tablet Take  4 each am x 2 days,   2 each am x 2 days,  1 each am x 2 days and stop   sertraline (ZOLOFT) 25 MG tablet TAKE 1 TABLET BY MOUTH EVERY DAY    12 system ROS was negative unless otherwise noted in HPI   Observations/Objective:  DATA  ABI Findings (03-01-19): +---------+------------------+-----+-------------------+--------+  Right     Rt Pressure (mmHg) Index Waveform      +---------+------------------+-----+-------------------+--------+  Brachial  161                                                    +---------+------------------+-----+-------------------+--------+  ATA       63                 0.39  dampened monophasic           +---------+------------------+-----+-------------------+--------+  PTA       82                 0.51  dampened monophasic           +---------+------------------+-----+-------------------+--------+  Great Toe                          barely discernable            +---------+------------------+-----+-------------------+--------+  +---------+------------------+-----+-------------------+-------+  Left      Lt Pressure (mmHg) Index Waveform          +---------+------------------+-----+-------------------+-------+  Brachial  155                                                   +---------+------------------+-----+-------------------+-------+  ATA       76                 0.47  dampened monophasic          +---------+------------------+-----+-------------------+-------+  PTA       87                 0.54  dampened monophasic          +---------+------------------+-----+-------------------+-------+  Great Toe 0                  0.00                               +---------+------------------+-----+-------------------+-------+  +-------+-----------+-----------------+------------+------------+  ABI/TBI Today's ABI Today's TBI        Previous ABI Previous TBI  +-------+-----------+-----------------+------------+------------+  Right   0.51        unable to discern 0.54         0             +-------+-----------+-----------------+------------+------------+  Left    0.54        0                 0.66         0.12          +-------+-----------+-----------------+------------+------------+  Previous ABI on 06/29/18.   Summary: Right: Resting right ankle-brachial index indicates moderate right lower extremity arterial disease. The right toe-brachial index is abnormal. Waveforms suggest more severe disease. Pedal pressures may be falsely elevated.  Left: Resting left ankle-brachial index indicates moderate left lower extremity arterial disease. The left toe-brachial index is abnormal. LT Great toe pressure = 0 mmHg. Waveforms suggest more severe disease. Pedal pressures may be falsely elevated.  AAA Duplex:  (10-06-17): 3.0 cm, Right CIA: 2.1 cm, Left CIA: 1.3 cm. Visualization limited by bowel gas. Previous largest abdominal aorta diameter measured 3.6 cm on 11-27-15.  (06-29-18): 3.2 cm; Right CIA: 1.9 cm; Left CIA: 1.6 cm   Left LE Arterial Duplex (03/17/17): The bypass graft is occluded from the origin to the distal end.  The graft was patent with no restenosis on 12-16-16.  12-16-16 Left Lower Extremity Arterial Duplex: Patent left LE bypass graft with no evidence of restenosis.  This is the first post-operative duplex exam.  09-17-16 Abdominal arteriogram Aortogram: No significant renal artery stenosis. No aortic stenosis. Bilateral common and external iliac arteries are widely patent Right Lower Extremity: Not evaluated Left Lower Extremity:Left common femoral profunda femoral artery are patent. The bypass graft is not visualized. There is reconstitution of the anterior tibial and peroneal artery. The anterior tibial is the dominant vessel across the ankle.   Assessment and  Plan:  At this point he walks about 50 feet and both calves hurt, relieved with rest,  But more recently it takes a long time for his left calf pain to resolve with rest.   ABI's demonstrate moderate disease bilaterally with dampened monophasic waveforms. Right great toe pressure is barely discernible, left is 0.   He cuts his own toenails, states he has no wounds from this. I cautioned him about this due to the lack of perfusion to his great toes. He states that he used to see a podiatrist by the name of Dr. Anner Crete, states he will try to make an appointment with him.   Gradually increase walking in a safe environment.  Daily seated leg exercises discussed.  I advised him to protect his feet at all times, wear socks and shoes in his house and when out.    Continue the Plavix and statin.  Continue Pletal as long as it is helping his walking distance.   He also has a small AAA we have been monitoring.    Follow Up Instructions:   Follow up in 3 months with ABI's and AAA duplex, see Dr. Myra Gianotti afterward.  I advised him to notify us if he develops pain in his calves at rest, or non healing wounds in his feet or legs.    I discussed the assessment and treatment plan with the patient. The patient was provided an opportunity to ask questions and all were answered. The patient agreed with the  plan and demonstrated an understanding of the instructions.   The patient was advised to call back or seek an in-person evaluation if the symptoms worsen or if the condition fails to improve as anticipated.  I spent 15 minutes with the patient via telephone encounter.   Donnalee CurrySigned, Pierrette Scheu [facility] [place] Office: [phone]  03/05/2019, 4:17 PM

## 2019-03-10 LAB — ALPHA-1 ANTITRYPSIN PHENOTYPE: A-1 Antitrypsin, Ser: 161 mg/dL (ref 83–199)

## 2019-03-11 NOTE — Progress Notes (Signed)
mychart msg sent

## 2019-03-22 DIAGNOSIS — M79604 Pain in right leg: Secondary | ICD-10-CM | POA: Diagnosis not present

## 2019-03-22 DIAGNOSIS — M545 Low back pain: Secondary | ICD-10-CM | POA: Diagnosis not present

## 2019-03-22 DIAGNOSIS — Z79891 Long term (current) use of opiate analgesic: Secondary | ICD-10-CM | POA: Diagnosis not present

## 2019-03-22 DIAGNOSIS — G8929 Other chronic pain: Secondary | ICD-10-CM | POA: Diagnosis not present

## 2019-03-22 DIAGNOSIS — M5431 Sciatica, right side: Secondary | ICD-10-CM | POA: Diagnosis not present

## 2019-03-22 DIAGNOSIS — M79605 Pain in left leg: Secondary | ICD-10-CM | POA: Diagnosis not present

## 2019-03-22 DIAGNOSIS — G894 Chronic pain syndrome: Secondary | ICD-10-CM | POA: Diagnosis not present

## 2019-03-26 ENCOUNTER — Encounter: Payer: Self-pay | Admitting: Physician Assistant

## 2019-03-26 ENCOUNTER — Ambulatory Visit (INDEPENDENT_AMBULATORY_CARE_PROVIDER_SITE_OTHER): Payer: Medicare Other | Admitting: Physician Assistant

## 2019-03-26 ENCOUNTER — Other Ambulatory Visit: Payer: Self-pay

## 2019-03-26 VITALS — BP 134/55 | HR 100 | Temp 99.2°F

## 2019-03-26 DIAGNOSIS — Z20828 Contact with and (suspected) exposure to other viral communicable diseases: Secondary | ICD-10-CM

## 2019-03-26 DIAGNOSIS — J449 Chronic obstructive pulmonary disease, unspecified: Secondary | ICD-10-CM

## 2019-03-26 DIAGNOSIS — R509 Fever, unspecified: Secondary | ICD-10-CM

## 2019-03-26 DIAGNOSIS — Z20822 Contact with and (suspected) exposure to covid-19: Secondary | ICD-10-CM

## 2019-03-26 NOTE — Patient Instructions (Addendum)
Instructions sent to MyChart.  Please go to [place], [place] for testing today as discussed. Keep hydrated and get plenty of rest.  Continue home O2.  If there is any increased work of breathing, O2 dropping below 90 or acute worsening of symptoms, go to the nearest ER or call 911.

## 2019-03-26 NOTE — Progress Notes (Signed)
Virtual Visit via Video   I connected with patient on 03/26/19 at  3:30 PM EDT by a video enabled telemedicine application and verified that I am speaking with the correct person using two identifiers.  Location patient: Home Location provider: Fernande Pena, Office Persons participating in the virtual visit: Patient, Provider, [place] (Christopher Pena)  I discussed the limitations of evaluation and management by telemedicine and the availability of in person appointments. The patient expressed understanding and agreed to proceed.  Subjective:   HPI:   Patient presents via Doxy.Me today c/o a few days of some mild SOB with a low-grade fever starting today. Patient with longstanding history of COPD, stage III on 2L O2 daily. Has not had to increase O2 requirement. Denies wheezing, chest pain, congestion. Fever with Tmax of 100.2. Denies recent travel or sick contact. Is concerned about COVID but notes no known exposure.   ROS:   See pertinent positives and negatives per HPI.  Patient Active Problem List   Diagnosis Date Noted  . Chronic respiratory failure with hypoxia ([place]) 02/05/2019  . Sepsis ([place]) 12/17/2018  . Acute on chronic respiratory failure with hypoxia ([place]) 12/17/2018  . Hyperlipidemia 02/12/2017  . Physical exam 08/14/2016  . Femoral-popliteal bypass graft occlusion, left ([place]) 05/21/2016  . Allergic rhinitis 05/13/2016  . Protein-calorie malnutrition, severe ([place]) 04/08/2016  . Pain in joint, lower leg 10/17/2014  . Visit for wound check 02/21/2014  . Encounter for post surgical wound check 12/09/2013  . Wound drainage-Right medial leg 12/09/2013  . Peripheral vascular disease, unspecified ([place]) 11/08/2013  . Leg edema, left, foot 06/22/2013  . Cellulitis of foot, left 06/09/2013  . Non-healing wound of lower extremity, lt great toe 06/09/2013  . Cigarette smoker 06/09/2013  . Claudication, lifestyle limiting 05/11/2013  . Unexplained weight loss 04/28/2013   . PAD (peripheral artery disease), PTA/stent IDEV & chocolate baloon 06/08/13, Previous PTA/Stent to Rt SFA 05/10/13 04/13/2013  . COPD III 04/13/2013  . HTN (hypertension) 04/13/2013  . Anxiety 04/13/2013  . Low back pain 04/13/2013    Social History   Tobacco Use  . Smoking status: Former Smoker    Packs/day: 0.25    Years: 50.00    Pack years: 12.50    Types: Cigarettes    Start date: 11/14/2015    Quit date: 11/13/2016    Years since quitting: 2.3  . Smokeless tobacco: Never Used  Substance Use Topics  . Alcohol use: No    Alcohol/week: 0.0 standard drinks    Comment: 06/08/2013 "quit 02/2007; drank my share before that though"    Current Outpatient Medications:  .  albuterol (VENTOLIN HFA) 108 (90 Base) MCG/ACT inhaler, Inhale 2 puffs into the lungs every 6 (six) hours as needed for wheezing or shortness of breath., Disp: , Rfl:  .  busPIRone (BUSPAR) 7.5 MG tablet, TAKE 1 TABLET BY MOUTH TWICE A DAY, Disp: 180 tablet, Rfl: 1 .  cilostazol (PLETAL) 100 MG tablet, Take 100 mg by mouth 2 (two) times daily., Disp: , Rfl:  .  clopidogrel (PLAVIX) 75 MG tablet, TAKE 1 TABLET BY MOUTH EVERY DAY, Disp: 90 tablet, Rfl: 1 .  ipratropium-albuterol (DUONEB) 0.5-2.5 (3) MG/3ML SOLN, Take 3 mLs by nebulization every 6 (six) hours as needed., Disp: 360 mL, Rfl: 11 .  oxyCODONE (ROXICODONE) 15 MG immediate release tablet, Take 15 mg by mouth every 4 (four) hours., Disp: , Rfl:  .  predniSONE (DELTASONE) 10 MG tablet, Take  4 each am x 2 days,  2 each am x 2 days,  1 each am x 2 days and stop, Disp: 14 tablet, Rfl: 11 .  sertraline (ZOLOFT) 25 MG tablet, TAKE 1 TABLET BY MOUTH EVERY DAY, Disp: 90 tablet, Rfl: 2  Allergies  Allergen Reactions  . Clindamycin/Lincomycin Diarrhea    Objective:   BP (!) 134/55   Pulse 100   Temp 99.2 F (37.3 C) (Oral)   SpO2 94% Comment: 2L  Patient is well-developed, well-nourished in no acute distress.  Resting comfortably at home.  Head is  normocephalic, atraumatic.  No labored breathing.  Speech is clear and coherent with logical contest.  Patient is alert and oriented at baseline.   Assessment and Plan:   1. COPD III 2. Suspected Covid-19 Virus Infection No SOB on examination. Patient is talking very animatedly throughout the entire visit without any difficulty with breathing, wheezing or speech. Giving new onset low grade fever will send him for COVID testing. He is high risk from complications so strict ER precautions reviewed with patient who voiced understanding. He has been enrolled in home COVID screening program. Is sent for testing today in [place]. He is to quarantine self until results are in.   - MyChart COVID-19 home monitoring program; Future - Temperature monitoring; Future    Christopher ClimesWilliam Cody [name], PA-C 03/26/2019

## 2019-03-26 NOTE — Progress Notes (Signed)
I have discussed the procedure for the virtual visit with the patient who has given consent to proceed with assessment and treatment.   Ehab Humber S Marcellas Marchant, CMA     

## 2019-03-27 DIAGNOSIS — J9621 Acute and chronic respiratory failure with hypoxia: Secondary | ICD-10-CM | POA: Diagnosis not present

## 2019-03-27 DIAGNOSIS — J441 Chronic obstructive pulmonary disease with (acute) exacerbation: Secondary | ICD-10-CM | POA: Diagnosis not present

## 2019-03-29 ENCOUNTER — Other Ambulatory Visit: Payer: Self-pay

## 2019-03-29 DIAGNOSIS — Z20822 Contact with and (suspected) exposure to covid-19: Secondary | ICD-10-CM

## 2019-03-30 LAB — NOVEL CORONAVIRUS, NAA: SARS-CoV-2, NAA: NOT DETECTED

## 2019-03-31 ENCOUNTER — Other Ambulatory Visit: Payer: Self-pay

## 2019-03-31 ENCOUNTER — Ambulatory Visit (INDEPENDENT_AMBULATORY_CARE_PROVIDER_SITE_OTHER): Payer: Medicare Other | Admitting: Family Medicine

## 2019-03-31 ENCOUNTER — Encounter: Payer: Self-pay | Admitting: Family Medicine

## 2019-03-31 VITALS — BP 132/77 | HR 83 | Temp 97.8°F

## 2019-03-31 DIAGNOSIS — Z20822 Contact with and (suspected) exposure to covid-19: Secondary | ICD-10-CM

## 2019-03-31 DIAGNOSIS — E785 Hyperlipidemia, unspecified: Secondary | ICD-10-CM | POA: Diagnosis not present

## 2019-03-31 DIAGNOSIS — R5383 Other fatigue: Secondary | ICD-10-CM | POA: Diagnosis not present

## 2019-03-31 DIAGNOSIS — Z20828 Contact with and (suspected) exposure to other viral communicable diseases: Secondary | ICD-10-CM

## 2019-03-31 DIAGNOSIS — T82898D Other specified complication of vascular prosthetic devices, implants and grafts, subsequent encounter: Secondary | ICD-10-CM

## 2019-03-31 DIAGNOSIS — Z0000 Encounter for general adult medical examination without abnormal findings: Secondary | ICD-10-CM | POA: Diagnosis not present

## 2019-03-31 DIAGNOSIS — Z125 Encounter for screening for malignant neoplasm of prostate: Secondary | ICD-10-CM

## 2019-03-31 DIAGNOSIS — J9621 Acute and chronic respiratory failure with hypoxia: Secondary | ICD-10-CM | POA: Diagnosis not present

## 2019-03-31 NOTE — Assessment & Plan Note (Signed)
Following w/ vascular

## 2019-03-31 NOTE — Progress Notes (Signed)
I have discussed the procedure for the virtual visit with the patient who has given consent to proceed with assessment and treatment.   Lurline Caver L Hoke Baer, CMA     

## 2019-03-31 NOTE — Assessment & Plan Note (Signed)
Check labs and tx prn.

## 2019-03-31 NOTE — Assessment & Plan Note (Signed)
Pt is now requiring O2 24 hrs/day rather than just at night.  + fever, cough- very concerning for COVID.  Stressed need for him to monitor sxs and to reach out immediately if anything changes or worsens.  Pt is aware.

## 2019-03-31 NOTE — Progress Notes (Signed)
Virtual Visit via Video   I connected with patient on 03/31/19 at 10:00 AM EDT by a video enabled telemedicine application and verified that I am speaking with the correct person using two identifiers.  Location patient: Home Location provider: [facility], Office Persons participating in the virtual visit: Patient, Provider, CMA (Jess B)  I discussed the limitations of evaluation and management by telemedicine and the availability of in person appointments. The patient expressed understanding and agreed to proceed.  Subjective:   HPI:   CPE- due for Pneumovax, declines colonoscopy.  Staying safe, has not seen family since Christmas.  Increased O2 requirement- has to wear O2 'pretty much 24-7' in order to keep O2 level 'in the low 90s'.  Pt reports 'overall not feeling well'.  Pt was febrile over the weekend and had sweats.  'real bad sore throat' on Saturday.  'I don't feel as sharp mentally'.  + fatigue.  Pt has not been anywhere w/ exception of haircut and doctor's visit (pain management).  ROS:  Patient reports no vision/hearing changes, anorexia, adenopathy, swallowing issues, chest pain, palpitations, edema, hemoptysis, gastrointestinal  bleeding (melena, rectal bleeding), abdominal pain, excessive heart burn, GU symptoms (dysuria, hematuria, voiding/incontinence issues) syncope, focal weakness, memory loss, numbness & tingling, skin/hair/nail changes, depression, anxiety, abnormal bruising/bleeding, musculoskeletal symptoms/signs.   + increased SOB in the last week + productive cough- improves w/ breathing treatment + chronic hoarseness  Patient Active Problem List   Diagnosis Date Noted  . Chronic respiratory failure with hypoxia (HCC) 02/05/2019  . Sepsis (HCC) 12/17/2018  . Acute on chronic respiratory failure with hypoxia (HCC) 12/17/2018  . Hyperlipidemia 02/12/2017  . Physical exam 08/14/2016  . Femoral-popliteal bypass graft occlusion, left (HCC) 05/21/2016   . Allergic rhinitis 05/13/2016  . Protein-calorie malnutrition, severe (HCC) 04/08/2016  . Pain in joint, lower leg 10/17/2014  . Visit for wound check 02/21/2014  . Encounter for post surgical wound check 12/09/2013  . Wound drainage-Right medial leg 12/09/2013  . Peripheral vascular disease, unspecified (HCC) 11/08/2013  . Leg edema, left, foot 06/22/2013  . Cellulitis of foot, left 06/09/2013  . Non-healing wound of lower extremity, lt great toe 06/09/2013  . Cigarette smoker 06/09/2013  . Claudication, lifestyle limiting 05/11/2013  . Unexplained weight loss 04/28/2013  . PAD (peripheral artery disease), PTA/stent IDEV & chocolate baloon 06/08/13, Previous PTA/Stent to Rt SFA 05/10/13 04/13/2013  . COPD III 04/13/2013  . HTN (hypertension) 04/13/2013  . Anxiety 04/13/2013  . Low back pain 04/13/2013    Social History   Tobacco Use  . Smoking status: Former Smoker    Packs/day: 0.25    Years: 50.00    Pack years: 12.50    Types: Cigarettes    Start date: 11/14/2015    Quit date: 11/13/2016    Years since quitting: 2.3  . Smokeless tobacco: Never Used  Substance Use Topics  . Alcohol use: No    Alcohol/week: 0.0 standard drinks    Comment: 06/08/2013 "quit 02/2007; drank my share before that though"    Current Outpatient Medications:  .  albuterol (VENTOLIN HFA) 108 (90 Base) MCG/ACT inhaler, Inhale 2 puffs into the lungs every 6 (six) hours as needed for wheezing or shortness of breath., Disp: , Rfl:  .  busPIRone (BUSPAR) 7.5 MG tablet, TAKE 1 TABLET BY MOUTH TWICE A DAY, Disp: 180 tablet, Rfl: 1 .  cilostazol (PLETAL) 100 MG tablet, Take 100 mg by mouth 2 (two) times daily., Disp: , Rfl:  .  clopidogrel (PLAVIX) 75 MG tablet, TAKE 1 TABLET BY MOUTH EVERY DAY, Disp: 90 tablet, Rfl: 1 .  ipratropium-albuterol (DUONEB) 0.5-2.5 (3) MG/3ML SOLN, Take 3 mLs by nebulization every 6 (six) hours as needed., Disp: 360 mL, Rfl: 11 .  oxyCODONE (ROXICODONE) 15 MG immediate release  tablet, Take 15 mg by mouth every 4 (four) hours., Disp: , Rfl:  .  sertraline (ZOLOFT) 25 MG tablet, TAKE 1 TABLET BY MOUTH EVERY DAY, Disp: 90 tablet, Rfl: 2 .  predniSONE (DELTASONE) 10 MG tablet, Take  4 each am x 2 days,   2 each am x 2 days,  1 each am x 2 days and stop (Patient not taking: Reported on 03/31/2019), Disp: 14 tablet, Rfl: 11  Allergies  Allergen Reactions  . Clindamycin/Lincomycin Diarrhea    Objective:   There were no vitals taken for this visit.  AAOx3, NAD NCAT, EOMI Nasal cannula in place No obvious CN deficits Coloring WNL Pt is able to speak clearly, coherently without shortness of breath or increased work of breathing.  Thought process is linear.  Mood is appropriate.   Assessment and Plan:   Suspected COVID- new.  Pt had a negative test on Monday but given his sxs, there is a high probability that this was a false negative.  I strongly encouraged him to monitor his sxs and if anything worsens, to let us know immediately.  Also instructed him to remain quarantined.  Pt expressed understanding and is in agreement w/ plan.   CPE- Pt is due for pneumovax which he is open to but declines colon cancer screening.  Check labs.  Anticipatory guidance provided.    Annye Asa, MD 03/31/2019

## 2019-04-02 ENCOUNTER — Telehealth: Payer: Self-pay

## 2019-04-02 NOTE — Telephone Encounter (Signed)
Would have to pass screening questions especially giving his acute on chronic resp failure noted by PCP at recent video visit (there was concern for false-negative COVID test).

## 2019-04-02 NOTE — Telephone Encounter (Signed)
It sounds as if he clears the COVID screening but I am not sure if Christopher Pena has appts near his lab time to accommodate the buzzing ears.  If Christopher Pena is agreeable to see him for this, he may need to adjust his lab appt

## 2019-04-02 NOTE — Telephone Encounter (Signed)
Called patient to discuss screening questions. Stated that he is still tired, but no fevers or cough in over a week. Before I ended the call, patient stated "this is going to sound really weird but I feel like there is an insect in my ear." Patient said that there is a light continuous buzzing, started over a week ago. Said that he has poured hydrogen peroxide in his ear, "trying to wash the bug out." Patient stated that he didn't know who to call about this. I told patient the PCP is out of the office next week, but I would route to PCP and Einar Pheasant to see if he can be seen for this on Monday around his original lab appt time. Please advise.

## 2019-04-02 NOTE — Addendum Note (Signed)
Addended by: [name] on: 04/02/2019 02:17 PM   Modules accepted: Orders

## 2019-04-02 NOTE — Telephone Encounter (Signed)
I will call patient first thing Monday morning to go over screening questions again. Christopher Pena has placed a hold on 11:30 same day time slot for him on 04/05/19 if he passes the screening.

## 2019-04-05 ENCOUNTER — Telehealth: Payer: Self-pay | Admitting: Emergency Medicine

## 2019-04-05 ENCOUNTER — Encounter: Payer: Self-pay | Admitting: Physician Assistant

## 2019-04-05 ENCOUNTER — Ambulatory Visit (INDEPENDENT_AMBULATORY_CARE_PROVIDER_SITE_OTHER): Payer: Medicare Other | Admitting: Physician Assistant

## 2019-04-05 ENCOUNTER — Other Ambulatory Visit: Payer: Self-pay

## 2019-04-05 ENCOUNTER — Encounter: Payer: Medicare Other | Admitting: Family Medicine

## 2019-04-05 VITALS — BP 124/80 | HR 106 | Temp 98.1°F | Resp 18 | Wt 171.0 lb

## 2019-04-05 DIAGNOSIS — Z125 Encounter for screening for malignant neoplasm of prostate: Secondary | ICD-10-CM | POA: Diagnosis not present

## 2019-04-05 DIAGNOSIS — E785 Hyperlipidemia, unspecified: Secondary | ICD-10-CM | POA: Diagnosis not present

## 2019-04-05 DIAGNOSIS — T161XXA Foreign body in right ear, initial encounter: Secondary | ICD-10-CM | POA: Diagnosis not present

## 2019-04-05 LAB — LIPID PANEL
Cholesterol: 177 mg/dL (ref 0–200)
HDL: 51.3 mg/dL (ref >39.00)
LDL Cholesterol: 103 mg/dL — ABNORMAL HIGH (ref 0–99)
NonHDL: 126
Total CHOL/HDL Ratio: 3
Triglycerides: 117 mg/dL (ref 0.0–149.0)
VLDL: 23.4 mg/dL (ref 0.0–40.0)

## 2019-04-05 LAB — BASIC METABOLIC PANEL
BUN: 13 mg/dL (ref 6–23)
CO2: 28 mEq/L (ref 19–32)
Calcium: 9.3 mg/dL (ref 8.4–10.5)
Chloride: 102 mEq/L (ref 96–112)
Creatinine, Ser: 0.96 mg/dL (ref 0.40–1.50)
GFR: 76.82 mL/min (ref >60.00)
Glucose, Bld: 132 mg/dL — ABNORMAL HIGH (ref 70–99)
Potassium: 5.1 mEq/L (ref 3.5–5.1)
Sodium: 138 mEq/L (ref 135–145)

## 2019-04-05 LAB — CBC WITH DIFFERENTIAL/PLATELET
Basophils Absolute: 0.1 10*3/uL (ref 0.0–0.1)
Basophils Relative: 1.3 % (ref 0.0–3.0)
Eosinophils Absolute: 0.2 10*3/uL (ref 0.0–0.7)
Eosinophils Relative: 2.2 % (ref 0.0–5.0)
HCT: 44 % (ref 39.0–52.0)
Hemoglobin: 14.6 g/dL (ref 13.0–17.0)
Lymphocytes Relative: 9.8 % — ABNORMAL LOW (ref 12.0–46.0)
Lymphs Abs: 1.1 10*3/uL (ref 0.7–4.0)
MCHC: 33.2 g/dL (ref 30.0–36.0)
MCV: 93 fl (ref 78.0–100.0)
Monocytes Absolute: 0.7 10*3/uL (ref 0.1–1.0)
Monocytes Relative: 6.3 % (ref 3.0–12.0)
Neutro Abs: 8.6 10*3/uL — ABNORMAL HIGH (ref 1.4–7.7)
Neutrophils Relative %: 80.4 % — ABNORMAL HIGH (ref 43.0–77.0)
Platelets: 405 10*3/uL — ABNORMAL HIGH (ref 150.0–400.0)
RBC: 4.73 Mil/uL (ref 4.22–5.81)
RDW: 14.8 % (ref 11.5–15.5)
WBC: 10.7 10*3/uL — ABNORMAL HIGH (ref 4.0–10.5)

## 2019-04-05 LAB — HEPATIC FUNCTION PANEL
ALT: 17 U/L (ref 0–53)
AST: 29 U/L (ref 0–37)
Albumin: 4.2 g/dL (ref 3.5–5.2)
Alkaline Phosphatase: 65 U/L (ref 39–117)
Bilirubin, Direct: 0.1 mg/dL (ref 0.0–0.3)
Total Bilirubin: 0.4 mg/dL (ref 0.2–1.2)
Total Protein: 7.9 g/dL (ref 6.0–8.3)

## 2019-04-05 LAB — TSH: TSH: 0.75 u[IU]/mL (ref 0.35–4.50)

## 2019-04-05 LAB — PSA, MEDICARE: PSA: 0.5 ng/ml (ref 0.10–4.00)

## 2019-04-05 NOTE — Telephone Encounter (Signed)
Patient brought in handicap renewal form for PCP to complete. Form is placed in PCP folder for completion. Will call patient when forms is ready for pick up.

## 2019-04-05 NOTE — Progress Notes (Signed)
Patient presents to clinic today c/o sensation of something in the R ear. Notes it sometimes feels like something is moving. Denies ear pressure, ear pain, drainage from the ear. Denies symptoms of L ear. Denies fever, chills or URI symptoms.    Past Medical History:  Diagnosis Date  . Anxiety   . Chronic low back pain    "degenerative spine dx'd ~ 7 yr ago" (06/08/2013)  . Claudication (HCC)   . Constipation due to pain medication   . COPD (chronic obstructive pulmonary disease) (HCC)   . Hypertension    "moderately high; RX didn't help" (06/08/2013) - no longer on meds (as of 09/19/16)  . Neuromuscular disorder (HCC)    degen spine  . Non-healing wound of lower extremity 06/09/2013  . PAD (peripheral artery disease), PTA/stent IDEV & chocolate baloon 06/08/13 04/13/2013   Severely reduced ABI's of 0.3 bilaterally   . Tobacco use 06/09/2013  . Unexplained weight loss     Current Outpatient Medications on File Prior to Visit  Medication Sig Dispense Refill  . albuterol (VENTOLIN HFA) 108 (90 Base) MCG/ACT inhaler Inhale 2 puffs into the lungs every 6 (six) hours as needed for wheezing or shortness of breath.    . busPIRone (BUSPAR) 7.5 MG tablet TAKE 1 TABLET BY MOUTH TWICE A DAY 180 tablet 1  . cilostazol (PLETAL) 100 MG tablet Take 100 mg by mouth 2 (two) times daily.    . clopidogrel (PLAVIX) 75 MG tablet TAKE 1 TABLET BY MOUTH EVERY DAY 90 tablet 1  . ipratropium-albuterol (DUONEB) 0.5-2.5 (3) MG/3ML SOLN Take 3 mLs by nebulization every 6 (six) hours as needed. 360 mL 11  . oxyCODONE (ROXICODONE) 15 MG immediate release tablet Take 15 mg by mouth every 4 (four) hours.    . predniSONE (DELTASONE) 10 MG tablet Take  4 each am x 2 days,   2 each am x 2 days,  1 each am x 2 days and stop 14 tablet 11  . sertraline (ZOLOFT) 25 MG tablet TAKE 1 TABLET BY MOUTH EVERY DAY 90 tablet 2   No current facility-administered medications on file prior to visit.     Allergies  Allergen  Reactions  . Clindamycin/Lincomycin Diarrhea    Family History  Problem Relation Age of Onset  . Hyperlipidemia Mother   . Heart disease Mother   . Diabetes Mother   . Cancer Father        Liver    Social History   Socioeconomic History  . Marital status: Divorced    Spouse name: Not on file  . Number of children: Not on file  . Years of education: Not on file  . Highest education level: Not on file  Occupational History  . Occupation: former Journalist, [employer]  Social Needs  . Financial resource strain: Not on file  . Food insecurity    Worry: Not on file    Inability: Not on file  . Transportation needs    Medical: Not on file    Non-medical: Not on file  Tobacco Use  . Smoking status: Former Smoker    Packs/day: 0.25    Years: 50.00    Pack years: 12.50    Types: Cigarettes    Start date: 11/14/2015    Quit date: 11/13/2016    Years since quitting: 2.3  . Smokeless tobacco: Never Used  Substance and Sexual Activity  . Alcohol use: No    Alcohol/week: 0.0 standard drinks    Comment: 06/08/2013 "  quit 02/2007; drank my share before that though"  . Drug use: No  . Sexual activity: Yes  Lifestyle  . Physical activity    Days per week: Not on file    Minutes per session: Not on file  . Stress: Not on file  Relationships  . Social Musicianconnections    Talks on phone: Not on file    Gets together: Not on file    Attends religious service: Not on file    Active member of club or organization: Not on file    Attends meetings of clubs or organizations: Not on file    Relationship status: Not on file  Other Topics Concern  . Not on file  Social History Narrative  . Not on file   Review of Systems - See HPI.  All other ROS are negative.  BP 124/80   Pulse (!) 106   Temp 98.1 F (36.7 C) (Skin)   Resp 18   Wt 171 lb (77.6 kg)   SpO2 95%   BMI 26.78 kg/m   Physical Exam Vitals signs reviewed.  Constitutional:      Appearance: Normal appearance.  HENT:     Head:  Normocephalic and atraumatic.     Right Ear: Tympanic membrane and external ear normal. A foreign body (tiny, 3 mm black piece of plasitc, rounded edges in the distal ear canal. Some dried blood noted around the object. ) is present.     Left Ear: Tympanic membrane, ear canal and external ear normal.  Neck:     Musculoskeletal: Neck supple.  Cardiovascular:     Rate and Rhythm: Normal rate and regular rhythm.     Pulses: Normal pulses.  Pulmonary:     Effort: Pulmonary effort is normal.     Breath sounds: Normal breath sounds.  Neurological:     Mental Status: He is alert.     Recent Results (from the past 2160 hour(s))  Basic metabolic panel     Status: Abnormal   Collection Time: 03/04/19 11:55 AM  Result Value Ref Range   Sodium 138 135 - 145 mEq/L   Potassium 4.6 3.5 - 5.1 mEq/L   Chloride 100 96 - 112 mEq/L   CO2 29 19 - 32 mEq/L   Glucose, Bld 125 (H) 70 - 99 mg/dL   BUN 10 6 - 23 mg/dL   Creatinine, Ser 4.090.98 0.40 - 1.50 mg/dL   Calcium 9.4 8.4 - 81.110.5 mg/dL   GFR 91.4775.03 >82.95>60.00 mL/min  CBC with Differential/Platelet     Status: Abnormal   Collection Time: 03/04/19 11:55 AM  Result Value Ref Range   WBC 9.0 4.0 - 10.5 K/uL   RBC 4.69 4.22 - 5.81 Mil/uL   Hemoglobin 14.3 13.0 - 17.0 g/dL   HCT 62.143.3 30.839.0 - 65.752.0 %   MCV 92.2 78.0 - 100.0 fl   MCHC 33.0 30.0 - 36.0 g/dL   RDW 84.615.4 96.211.5 - 95.215.5 %   Platelets 366.0 150.0 - 400.0 K/uL   Neutrophils Relative % 78.5 (H) 43.0 - 77.0 %   Lymphocytes Relative 8.7 (L) 12.0 - 46.0 %   Monocytes Relative 8.6 3.0 - 12.0 %   Eosinophils Relative 2.9 0.0 - 5.0 %   Basophils Relative 1.3 0.0 - 3.0 %   Neutro Abs 7.1 1.4 - 7.7 K/uL   Lymphs Abs 0.8 0.7 - 4.0 K/uL   Monocytes Absolute 0.8 0.1 - 1.0 K/uL   Eosinophils Absolute 0.3 0.0 - 0.7 K/uL  Basophils Absolute 0.1 0.0 - 0.1 K/uL  Alpha-1 antitrypsin phenotype     Status: None   Collection Time: 03/04/19 11:55 AM  Result Value Ref Range   A-1 Antitrypsin, Ser 161 83 - 199 mg/dL    ALPHA-1-ANTITRYPSIN (AAT) PHENOTYPE SEE NOTE     Comment: THIS PATIENT'S ALPHA-1-ANTITRYPSIN PHENOTYPE IS PI*MM. Marland Kitchen 90% of normal individuals have the MM phenotype, with normal quantitative AAT levels. Many phenotypic patterns have been described, including deficiency states with F, S, Z, or other alleles. As a general estimation, compared to M allele of 100% of normal A-1-Antitrypsin protein, the S allele produces approximately 60% and the Z allele 20%. For example, an MS phenotype would have about 80% of normal A-1-Antitrypsin protein level, a 50% contribution from the M allele and 30% from the S allele. A ZZ phenotype would have about 20% of normal levels, a 10% contribution from each Z gene. The F allele has normal A-1-Antitrypsin levels, but the kinetics of elastase inhibition is not as efficient as an M allele product; F alleles should be considered functionally mildly deficient. Other variants are identifiable by phenotypic analysis. These include CM, DP, EM, GM, IS, LM, M1M2, M3M3, MP, MT, XX, MY, and M1N. I, P, T and  null alleles are considered deleterious. C, D, E, G, L, M1, M2, M3, X and Y alleles are generally considered normal variants. The MZ-Pratt phenotype is a normal variant; care should be taken to avoid confusion with the deficient MZ phenotype.   Novel Coronavirus, NAA ([facility])     Status: None   Collection Time: 03/29/19 12:00 AM   Specimen: Oropharyngeal(OP) collection in vial transport medium   OROPHARYNGEA  TESTING  Result Value Ref Range   SARS-CoV-2, NAA Not Detected Not Detected    Comment: This test was developed and its performance characteristics determined by [employer]. This test has not been FDA cleared or approved. This test has been authorized by FDA under an Emergency Use Authorization (EUA). This test is only authorized for the duration of time the declaration that circumstances exist justifying the authorization of the emergency  use of in vitro diagnostic tests for detection of SARS-CoV-2 virus and/or diagnosis of COVID-19 infection under section 564(b)(1) of the Act, 21 U.S.C. 782NFA-2(Z)(3), unless the authorization is terminated or revoked sooner. When diagnostic testing is negative, the possibility of a false negative result should be considered in the context of a patient's recent exposures and the presence of clinical signs and symptoms consistent with COVID-19. An individual without symptoms of COVID-19 and who is not shedding SARS-CoV-2 virus would expect to have a negative (not detected) result in this assay.     Assessment/Plan: 1. Foreign body of right ear, initial encounter Hard black plastic noted in the ear. Removed via combination of flushing and alligator forceps along with some remnant cerumen. Tolerated well. Ear care reviewed. Return precautions discussed with patient.    Leeanne Rio, PA-C

## 2019-04-05 NOTE — Telephone Encounter (Signed)
Spoke with patient, asked several times any coughing, fevers, chills, ANY COVID or flu like symptoms within the past 72 hours. Patient denied all symptoms. Aware to wear mask into office. Scheduled for 11:30 time slot today.

## 2019-04-05 NOTE — Patient Instructions (Signed)
Please avoid putting anything in the ear (qtip, etc) as it can cause an earwax impaction or trauma to the ear canal or drum.   Can use capful of peroxide once a week to help clean out wax.  Let me know if there is any recurrence of symptoms.

## 2019-04-12 NOTE — Telephone Encounter (Signed)
Picked up from the back and made pt aware it was ready

## 2019-04-12 NOTE — Telephone Encounter (Signed)
fyi

## 2019-04-12 NOTE — Telephone Encounter (Signed)
Form completed and placed in basket  

## 2019-04-15 ENCOUNTER — Ambulatory Visit (INDEPENDENT_AMBULATORY_CARE_PROVIDER_SITE_OTHER): Payer: Medicare Other

## 2019-04-15 ENCOUNTER — Other Ambulatory Visit: Payer: Self-pay

## 2019-04-15 DIAGNOSIS — Z23 Encounter for immunization: Secondary | ICD-10-CM | POA: Diagnosis not present

## 2019-04-23 DIAGNOSIS — M79604 Pain in right leg: Secondary | ICD-10-CM | POA: Diagnosis not present

## 2019-04-23 DIAGNOSIS — G8929 Other chronic pain: Secondary | ICD-10-CM | POA: Diagnosis not present

## 2019-04-23 DIAGNOSIS — M79605 Pain in left leg: Secondary | ICD-10-CM | POA: Diagnosis not present

## 2019-04-23 DIAGNOSIS — G894 Chronic pain syndrome: Secondary | ICD-10-CM | POA: Diagnosis not present

## 2019-04-23 DIAGNOSIS — Z79891 Long term (current) use of opiate analgesic: Secondary | ICD-10-CM | POA: Diagnosis not present

## 2019-04-23 DIAGNOSIS — M5432 Sciatica, left side: Secondary | ICD-10-CM | POA: Diagnosis not present

## 2019-04-23 DIAGNOSIS — M545 Low back pain: Secondary | ICD-10-CM | POA: Diagnosis not present

## 2019-04-27 DIAGNOSIS — J9621 Acute and chronic respiratory failure with hypoxia: Secondary | ICD-10-CM | POA: Diagnosis not present

## 2019-04-27 DIAGNOSIS — J441 Chronic obstructive pulmonary disease with (acute) exacerbation: Secondary | ICD-10-CM | POA: Diagnosis not present

## 2019-05-19 DIAGNOSIS — G8929 Other chronic pain: Secondary | ICD-10-CM | POA: Diagnosis not present

## 2019-05-19 DIAGNOSIS — M545 Low back pain: Secondary | ICD-10-CM | POA: Diagnosis not present

## 2019-05-19 DIAGNOSIS — M5432 Sciatica, left side: Secondary | ICD-10-CM | POA: Diagnosis not present

## 2019-05-19 DIAGNOSIS — Z79891 Long term (current) use of opiate analgesic: Secondary | ICD-10-CM | POA: Diagnosis not present

## 2019-05-19 DIAGNOSIS — M79605 Pain in left leg: Secondary | ICD-10-CM | POA: Diagnosis not present

## 2019-05-19 DIAGNOSIS — G894 Chronic pain syndrome: Secondary | ICD-10-CM | POA: Diagnosis not present

## 2019-05-19 DIAGNOSIS — M79604 Pain in right leg: Secondary | ICD-10-CM | POA: Diagnosis not present

## 2019-05-19 DIAGNOSIS — M5431 Sciatica, right side: Secondary | ICD-10-CM | POA: Diagnosis not present

## 2019-05-21 ENCOUNTER — Other Ambulatory Visit: Payer: Self-pay | Admitting: Family Medicine

## 2019-05-27 DIAGNOSIS — J441 Chronic obstructive pulmonary disease with (acute) exacerbation: Secondary | ICD-10-CM | POA: Diagnosis not present

## 2019-05-27 DIAGNOSIS — J9621 Acute and chronic respiratory failure with hypoxia: Secondary | ICD-10-CM | POA: Diagnosis not present

## 2019-05-31 ENCOUNTER — Other Ambulatory Visit: Payer: Self-pay

## 2019-05-31 DIAGNOSIS — I779 Disorder of arteries and arterioles, unspecified: Secondary | ICD-10-CM

## 2019-05-31 DIAGNOSIS — I714 Abdominal aortic aneurysm, without rupture, unspecified: Secondary | ICD-10-CM

## 2019-06-03 ENCOUNTER — Ambulatory Visit (HOSPITAL_COMMUNITY): Payer: Medicare Other

## 2019-06-07 ENCOUNTER — Ambulatory Visit: Payer: Medicare Other | Admitting: Surgery

## 2019-06-17 DIAGNOSIS — M5431 Sciatica, right side: Secondary | ICD-10-CM | POA: Diagnosis not present

## 2019-06-17 DIAGNOSIS — M79605 Pain in left leg: Secondary | ICD-10-CM | POA: Diagnosis not present

## 2019-06-17 DIAGNOSIS — G8929 Other chronic pain: Secondary | ICD-10-CM | POA: Diagnosis not present

## 2019-06-17 DIAGNOSIS — Z79891 Long term (current) use of opiate analgesic: Secondary | ICD-10-CM | POA: Diagnosis not present

## 2019-06-17 DIAGNOSIS — M79604 Pain in right leg: Secondary | ICD-10-CM | POA: Diagnosis not present

## 2019-06-17 DIAGNOSIS — G894 Chronic pain syndrome: Secondary | ICD-10-CM | POA: Diagnosis not present

## 2019-06-17 DIAGNOSIS — M545 Low back pain: Secondary | ICD-10-CM | POA: Diagnosis not present

## 2019-06-20 ENCOUNTER — Other Ambulatory Visit: Payer: Self-pay | Admitting: Family

## 2019-06-27 DIAGNOSIS — J9621 Acute and chronic respiratory failure with hypoxia: Secondary | ICD-10-CM | POA: Diagnosis not present

## 2019-06-27 DIAGNOSIS — J441 Chronic obstructive pulmonary disease with (acute) exacerbation: Secondary | ICD-10-CM | POA: Diagnosis not present

## 2019-07-14 DIAGNOSIS — G894 Chronic pain syndrome: Secondary | ICD-10-CM | POA: Diagnosis not present

## 2019-07-14 DIAGNOSIS — G8929 Other chronic pain: Secondary | ICD-10-CM | POA: Diagnosis not present

## 2019-07-14 DIAGNOSIS — M5432 Sciatica, left side: Secondary | ICD-10-CM | POA: Diagnosis not present

## 2019-07-14 DIAGNOSIS — Z79891 Long term (current) use of opiate analgesic: Secondary | ICD-10-CM | POA: Diagnosis not present

## 2019-07-14 DIAGNOSIS — M79605 Pain in left leg: Secondary | ICD-10-CM | POA: Diagnosis not present

## 2019-07-14 DIAGNOSIS — M545 Low back pain: Secondary | ICD-10-CM | POA: Diagnosis not present

## 2019-07-14 DIAGNOSIS — M79604 Pain in right leg: Secondary | ICD-10-CM | POA: Diagnosis not present

## 2019-07-14 DIAGNOSIS — M5431 Sciatica, right side: Secondary | ICD-10-CM | POA: Diagnosis not present

## 2019-07-19 ENCOUNTER — Ambulatory Visit: Payer: Medicare Other | Admitting: Surgery

## 2019-07-19 ENCOUNTER — Other Ambulatory Visit (HOSPITAL_COMMUNITY): Payer: Medicare Other

## 2019-07-19 ENCOUNTER — Encounter (HOSPITAL_COMMUNITY): Payer: Medicare Other

## 2019-07-27 DIAGNOSIS — J9621 Acute and chronic respiratory failure with hypoxia: Secondary | ICD-10-CM | POA: Diagnosis not present

## 2019-07-27 DIAGNOSIS — J441 Chronic obstructive pulmonary disease with (acute) exacerbation: Secondary | ICD-10-CM | POA: Diagnosis not present

## 2019-08-05 ENCOUNTER — Other Ambulatory Visit: Payer: Self-pay | Admitting: Vascular Surgery

## 2019-08-11 DIAGNOSIS — M79604 Pain in right leg: Secondary | ICD-10-CM | POA: Diagnosis not present

## 2019-08-11 DIAGNOSIS — M5431 Sciatica, right side: Secondary | ICD-10-CM | POA: Diagnosis not present

## 2019-08-11 DIAGNOSIS — M5432 Sciatica, left side: Secondary | ICD-10-CM | POA: Diagnosis not present

## 2019-08-11 DIAGNOSIS — G894 Chronic pain syndrome: Secondary | ICD-10-CM | POA: Diagnosis not present

## 2019-08-11 DIAGNOSIS — G8929 Other chronic pain: Secondary | ICD-10-CM | POA: Diagnosis not present

## 2019-08-11 DIAGNOSIS — Z79891 Long term (current) use of opiate analgesic: Secondary | ICD-10-CM | POA: Diagnosis not present

## 2019-08-11 DIAGNOSIS — M545 Low back pain: Secondary | ICD-10-CM | POA: Diagnosis not present

## 2019-08-27 DIAGNOSIS — J441 Chronic obstructive pulmonary disease with (acute) exacerbation: Secondary | ICD-10-CM | POA: Diagnosis not present

## 2019-08-27 DIAGNOSIS — J9621 Acute and chronic respiratory failure with hypoxia: Secondary | ICD-10-CM | POA: Diagnosis not present

## 2019-09-06 ENCOUNTER — Other Ambulatory Visit: Payer: Self-pay

## 2019-09-06 ENCOUNTER — Encounter: Payer: Self-pay | Admitting: Internal Medicine

## 2019-09-06 ENCOUNTER — Ambulatory Visit: Payer: Medicare Other | Admitting: Internal Medicine

## 2019-09-06 ENCOUNTER — Ambulatory Visit (INDEPENDENT_AMBULATORY_CARE_PROVIDER_SITE_OTHER): Payer: Medicare Other

## 2019-09-06 DIAGNOSIS — R0609 Other forms of dyspnea: Secondary | ICD-10-CM

## 2019-09-06 DIAGNOSIS — R0600 Dyspnea, unspecified: Secondary | ICD-10-CM | POA: Diagnosis not present

## 2019-09-06 DIAGNOSIS — J449 Chronic obstructive pulmonary disease, unspecified: Secondary | ICD-10-CM | POA: Diagnosis not present

## 2019-09-06 DIAGNOSIS — J9611 Chronic respiratory failure with hypoxia: Secondary | ICD-10-CM

## 2019-09-06 LAB — CBC WITH DIFFERENTIAL/PLATELET
Basophils Absolute: 0.1 10*3/uL (ref 0.0–0.1)
Basophils Relative: 0.9 % (ref 0.0–3.0)
Eosinophils Absolute: 0.3 10*3/uL (ref 0.0–0.7)
Eosinophils Relative: 2.1 % (ref 0.0–5.0)
HCT: 42.5 % (ref 39.0–52.0)
Hemoglobin: 14.1 g/dL (ref 13.0–17.0)
Lymphocytes Relative: 9.1 % — ABNORMAL LOW (ref 12.0–46.0)
Lymphs Abs: 1.4 10*3/uL (ref 0.7–4.0)
MCHC: 33.1 g/dL (ref 30.0–36.0)
MCV: 91.1 fl (ref 78.0–100.0)
Monocytes Absolute: 1.2 10*3/uL — ABNORMAL HIGH (ref 0.1–1.0)
Monocytes Relative: 7.8 % (ref 3.0–12.0)
Neutro Abs: 12.2 10*3/uL — ABNORMAL HIGH (ref 1.4–7.7)
Neutrophils Relative %: 80.1 % — ABNORMAL HIGH (ref 43.0–77.0)
Platelets: 381 10*3/uL (ref 150.0–400.0)
RBC: 4.67 Mil/uL (ref 4.22–5.81)
RDW: 14.7 % (ref 11.5–15.5)
WBC: 15.3 10*3/uL — ABNORMAL HIGH (ref 4.0–10.5)

## 2019-09-06 LAB — BASIC METABOLIC PANEL
BUN: 15 mg/dL (ref 6–23)
CO2: 24 mEq/L (ref 19–32)
Calcium: 8.9 mg/dL (ref 8.4–10.5)
Chloride: 102 mEq/L (ref 96–112)
Creatinine, Ser: 1.01 mg/dL (ref 0.40–1.50)
GFR: 72.36 mL/min (ref >60.00)
Glucose, Bld: 134 mg/dL — ABNORMAL HIGH (ref 70–99)
Potassium: 3.6 mEq/L (ref 3.5–5.1)
Sodium: 138 mEq/L (ref 135–145)

## 2019-09-06 LAB — TSH: TSH: 1.53 u[IU]/mL (ref 0.35–4.50)

## 2019-09-06 LAB — D-DIMER, QUANTITATIVE: D-Dimer, Quant: 3.5 mcg/mL FEU — ABNORMAL HIGH (ref <0.50)

## 2019-09-06 LAB — BRAIN NATRIURETIC PEPTIDE: Pro B Natriuretic peptide (BNP): 41 pg/mL (ref 0.0–100.0)

## 2019-09-06 NOTE — Assessment & Plan Note (Signed)
As of 09/06/2019  rec 2lpm hs and titrate daytime to keep > 90% RA  Advised: Make sure you check your oxygen saturations at highest level of activity to be sure it stays over 90% and adjust upward to maintain this level if needed but remember to turn it back to previous settings when you stop (to conserve your supply).
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
## 2019-09-06 NOTE — Assessment & Plan Note (Signed)
Symptoms are   disproportionate to objective findings and not clear to what extent this is all a  pulmonary  problem but pt does appear to have difficult to sort out respiratory symptoms of unknown origin for which  DDX  = almost all start with A and  include Adherence, Ace Inhibitors, Acid Reflux, Active Sinus Disease, Alpha 1 Antitripsin deficiency, Anxiety masquerading as Airways dz,  ABPA,  Allergy(esp in young), Aspiration (esp in elderly), Adverse effects of meds,  Active smoking or Vaping, A bunch of PE's/clot burden (a few small clots can't cause this syndrome unless there is already severe underlying pulm or vascular dz with poor reserve),  Anemia or thyroid disorder, plus two Bs  = Bronchiectasis and Beta blocker use..and one C= CHF    Adherence is always the initial "prime suspect" and is a multilayered concern that requires a "trust but verify" approach in every patient - starting with knowing how to use medications, especially inhalers, correctly, keeping up with refills and understanding the fundamental difference between maintenance and prns vs those medications only taken for a very short course and then stopped and not refilled.  - rec with all meds in hand using a trust but verify approach to confirm accurate Medication  Reconciliation The principal here is that until we are certain that the  patients are doing what we've asked, it makes no sense to ask them to do more.   ?allergy/ asthma component > note prev better on prednisone and EOS 0.3 so ok to use pred x 6 days and if better then add pulmocort 0.25 mg bid to neb rx.   ? Anxiety/depression/ deconditioning >   usually at the bottom of this list of usual suspects but included here  based on H and P and note already on psychotropics and may interfere with adherence and also interpretation of response or lack thereof to symptom management which can be quite subjective.   Alpha One def already exlcluded  Adverse drug effects > none of  the usual suspects listed   Anemia / thyroid dz ruled out today.  ? A bunch of PE's > he had similar presentation in 11/2018 with elevated D dimer with neg CTa same date so likely was false positive then and now but will advise on repeating CTa for any abrupt /unexplained worsening of resp status.  ? BB effects > not on them   ? Bronchiectasis > not present on CT chest 11/2018 so unlikley now  ? Chf > excluded with bnp so low    F/u in 2 weeks with CTa in meantime if not improving.          Each maintenance medication was reviewed in detail including emphasizing most importantly the difference between maintenance and prns and under what circumstances the prns are to be triggered using an action plan format that is not reflected in the computer generated alphabetically organized AVS which I have not found useful in most complex patients, especially with respiratory illnesses  Total time for H and P, chart review, counseling, and generating AVS / charting =  40 min

## 2019-09-06 NOTE — Patient Instructions (Addendum)
Your 0xygen needs to be humidified as much as possible   Adjust 02 to keep your saturations over  90% at the highest level of activity (not after you stop)  duoneb is 4 x daily   If losing ground with your breathing try Prednisone 10 mg take  4 each am x 2 days,   2 each am x 2 days,  1 each am x 2 days and stop    Please remember to go to the lab and x-ray department   for your tests - we will call you with the results when they are available.  Late add:  F/u in 2 weeks with CTa in meantime if not improving.

## 2019-09-06 NOTE — Assessment & Plan Note (Signed)
Quit smoking 2018 Spirometry 04/30/2016  FEV1 1.23 (49%)  Ratio 67 - 04/30/2016  Walked RA x 3 laps @ 185 ft each stopped due to  End of study, fast pace pace, sob with desats at 88%    - 04/30/2016 rec trial of BREO > no better - PFT's  06/18/2016  FEV1 1.44 (49 % ) ratio 56  p 9 % improvement from saba p BREO prior to study with DLCO  37/38 % corrects to 41  % for alv volume   - 02/03/2019  After extensive coaching inhaler device,  effectiveness =    75% but can't afford lama/laba so rec duoneb qid and have on hand pred x 6 d for flares - alpha one AT Screen 03/04/2019  MM level 161   Relatively well compensated despite not using neb qid as rec so first rec try duoneb up to qid esp timed prior to exertion.  Also reminded re prednisone rx if breathing worse for any reason.   Pt informed of the seriousness of COVID 19 infection as a direct risk to lung health  and safey and to close contacts and should continue to wear a facemask in public and minimize exposure to public locations but especially avoid any area or activity where non-close contacts are not observing distancing or wearing an appropriate face mask.  I strongly recommended vaccine when offered.

## 2019-09-07 NOTE — Progress Notes (Signed)
Spoke with pt and notified of results per Dr. Wert. Pt verbalized understanding and denied any questions. 

## 2019-09-09 DIAGNOSIS — M5431 Sciatica, right side: Secondary | ICD-10-CM | POA: Diagnosis not present

## 2019-09-09 DIAGNOSIS — G8929 Other chronic pain: Secondary | ICD-10-CM | POA: Diagnosis not present

## 2019-09-09 DIAGNOSIS — M79605 Pain in left leg: Secondary | ICD-10-CM | POA: Diagnosis not present

## 2019-09-09 DIAGNOSIS — M545 Low back pain: Secondary | ICD-10-CM | POA: Diagnosis not present

## 2019-09-09 DIAGNOSIS — G894 Chronic pain syndrome: Secondary | ICD-10-CM | POA: Diagnosis not present

## 2019-09-09 DIAGNOSIS — Z79891 Long term (current) use of opiate analgesic: Secondary | ICD-10-CM | POA: Diagnosis not present

## 2019-09-09 DIAGNOSIS — M79604 Pain in right leg: Secondary | ICD-10-CM | POA: Diagnosis not present

## 2019-09-09 DIAGNOSIS — M5432 Sciatica, left side: Secondary | ICD-10-CM | POA: Diagnosis not present

## 2019-09-27 DIAGNOSIS — J9621 Acute and chronic respiratory failure with hypoxia: Secondary | ICD-10-CM | POA: Diagnosis not present

## 2019-09-27 DIAGNOSIS — J441 Chronic obstructive pulmonary disease with (acute) exacerbation: Secondary | ICD-10-CM | POA: Diagnosis not present

## 2019-10-02 ENCOUNTER — Ambulatory Visit: Payer: Medicare Other | Attending: Internal Medicine

## 2019-10-02 DIAGNOSIS — Z23 Encounter for immunization: Secondary | ICD-10-CM | POA: Insufficient documentation

## 2019-10-02 NOTE — Progress Notes (Signed)
   Covid-19 Vaccination Clinic  Name:  Christopher Pena    MRN: [id number] DOB: December 08, 1945  10/02/2019  [name] was observed post Covid-19 immunization for 30 minutes based on pre-vaccination screening without incidence. He was provided with Vaccine Information Sheet and instruction to access the V-Safe system.   [name] was instructed to call 911 with any severe reactions post vaccine: Marland Kitchen Difficulty breathing  . Swelling of your face and throat  . A fast heartbeat  . A bad rash all over your body  . Dizziness and weakness    Immunizations Administered    Name Date Dose VIS Date Route   Pfizer COVID-19 Vaccine 10/02/2019  1:58 PM 0.3 mL 07/30/2019 Intramuscular   Manufacturer: [employer], [employer]   Lot: [id number]   NDC: [address]-9

## 2019-10-07 DIAGNOSIS — M545 Low back pain: Secondary | ICD-10-CM | POA: Diagnosis not present

## 2019-10-07 DIAGNOSIS — M79604 Pain in right leg: Secondary | ICD-10-CM | POA: Diagnosis not present

## 2019-10-07 DIAGNOSIS — M5431 Sciatica, right side: Secondary | ICD-10-CM | POA: Diagnosis not present

## 2019-10-07 DIAGNOSIS — M79605 Pain in left leg: Secondary | ICD-10-CM | POA: Diagnosis not present

## 2019-10-07 DIAGNOSIS — G8929 Other chronic pain: Secondary | ICD-10-CM | POA: Diagnosis not present

## 2019-10-07 DIAGNOSIS — G894 Chronic pain syndrome: Secondary | ICD-10-CM | POA: Diagnosis not present

## 2019-10-07 DIAGNOSIS — Z79891 Long term (current) use of opiate analgesic: Secondary | ICD-10-CM | POA: Diagnosis not present

## 2019-10-07 DIAGNOSIS — M5432 Sciatica, left side: Secondary | ICD-10-CM | POA: Diagnosis not present

## 2019-10-19 ENCOUNTER — Encounter: Payer: Self-pay | Admitting: Surgery

## 2019-10-25 ENCOUNTER — Ambulatory Visit: Payer: Medicare Other | Attending: Internal Medicine

## 2019-10-25 DIAGNOSIS — Z23 Encounter for immunization: Secondary | ICD-10-CM

## 2019-10-25 DIAGNOSIS — J9621 Acute and chronic respiratory failure with hypoxia: Secondary | ICD-10-CM | POA: Diagnosis not present

## 2019-10-25 DIAGNOSIS — J441 Chronic obstructive pulmonary disease with (acute) exacerbation: Secondary | ICD-10-CM | POA: Diagnosis not present

## 2019-10-25 NOTE — Progress Notes (Signed)
   Covid-19 Vaccination Clinic  Name:  JURELL BASISTA    MRN: [id number] DOB: 03-29-1946  10/25/2019  Mr. Dahir was observed post Covid-19 immunization for 15 minutes without incident. He was provided with Vaccine Information Sheet and instruction to access the V-Safe system.   Mr. Lamar was instructed to call 911 with any severe reactions post vaccine: Marland Kitchen Difficulty breathing  . Swelling of face and throat  . A fast heartbeat  . A bad rash all over body  . Dizziness and weakness   Immunizations Administered    Name Date Dose VIS Date Route   Pfizer COVID-19 Vaccine 10/25/2019 12:11 PM 0.3 mL 07/30/2019 Intramuscular   Manufacturer: [employer], [employer]   Lot: [id number]   NDC: [address]-1

## 2019-11-04 DIAGNOSIS — G8929 Other chronic pain: Secondary | ICD-10-CM | POA: Diagnosis not present

## 2019-11-04 DIAGNOSIS — Z79891 Long term (current) use of opiate analgesic: Secondary | ICD-10-CM | POA: Diagnosis not present

## 2019-11-04 DIAGNOSIS — M545 Low back pain: Secondary | ICD-10-CM | POA: Diagnosis not present

## 2019-11-04 DIAGNOSIS — M5432 Sciatica, left side: Secondary | ICD-10-CM | POA: Diagnosis not present

## 2019-11-04 DIAGNOSIS — M5431 Sciatica, right side: Secondary | ICD-10-CM | POA: Diagnosis not present

## 2019-11-04 DIAGNOSIS — M79605 Pain in left leg: Secondary | ICD-10-CM | POA: Diagnosis not present

## 2019-11-04 DIAGNOSIS — G894 Chronic pain syndrome: Secondary | ICD-10-CM | POA: Diagnosis not present

## 2019-11-04 DIAGNOSIS — M79604 Pain in right leg: Secondary | ICD-10-CM | POA: Diagnosis not present

## 2019-11-06 ENCOUNTER — Other Ambulatory Visit: Payer: Self-pay | Admitting: Family Medicine

## 2019-11-25 DIAGNOSIS — J441 Chronic obstructive pulmonary disease with (acute) exacerbation: Secondary | ICD-10-CM | POA: Diagnosis not present

## 2019-11-25 DIAGNOSIS — J9621 Acute and chronic respiratory failure with hypoxia: Secondary | ICD-10-CM | POA: Diagnosis not present

## 2019-11-29 ENCOUNTER — Telehealth: Payer: Self-pay | Admitting: Family Medicine

## 2019-11-29 NOTE — Progress Notes (Signed)
  Chronic Care Management   Note  11/29/2019 Name: Christopher Pena MRN: [id number] DOB: 12-19-1945  Christopher Pena is a 74 y.o. year old male who is a primary care patient of Beverely Low, Helane Rima, MD. I reached out to Christopher Bales Mckee by phone today in response to a referral sent by [name]'s PCP, Sheliah Hatch, MD.   [name] was given information about Chronic Care Management services today including:  1. CCM service includes personalized support from designated clinical staff supervised by his physician, including individualized plan of care and coordination with other care providers 2. 24/7 contact phone numbers for assistance for urgent and routine care needs. 3. Service will only be billed when office clinical staff spend 20 minutes or more in a month to coordinate care. 4. Only one practitioner may furnish and bill the service in a calendar month. 5. The patient may stop CCM services at any time (effective at the end of the month) by phone call to the office staff.   Patient agreed to services and verbal consent obtained.   Follow up plan:   Lynnae January Upstream Scheduler

## 2019-12-02 DIAGNOSIS — Z79891 Long term (current) use of opiate analgesic: Secondary | ICD-10-CM | POA: Diagnosis not present

## 2019-12-02 DIAGNOSIS — G8929 Other chronic pain: Secondary | ICD-10-CM | POA: Diagnosis not present

## 2019-12-02 DIAGNOSIS — M5431 Sciatica, right side: Secondary | ICD-10-CM | POA: Diagnosis not present

## 2019-12-02 DIAGNOSIS — M79604 Pain in right leg: Secondary | ICD-10-CM | POA: Diagnosis not present

## 2019-12-02 DIAGNOSIS — M545 Low back pain: Secondary | ICD-10-CM | POA: Diagnosis not present

## 2019-12-02 DIAGNOSIS — M79605 Pain in left leg: Secondary | ICD-10-CM | POA: Diagnosis not present

## 2019-12-02 DIAGNOSIS — M5432 Sciatica, left side: Secondary | ICD-10-CM | POA: Diagnosis not present

## 2019-12-02 DIAGNOSIS — G894 Chronic pain syndrome: Secondary | ICD-10-CM | POA: Diagnosis not present

## 2019-12-10 ENCOUNTER — Other Ambulatory Visit: Payer: Self-pay | Admitting: General Practice

## 2019-12-10 DIAGNOSIS — I739 Peripheral vascular disease, unspecified: Secondary | ICD-10-CM

## 2019-12-10 DIAGNOSIS — F419 Anxiety disorder, unspecified: Secondary | ICD-10-CM

## 2019-12-10 DIAGNOSIS — J449 Chronic obstructive pulmonary disease, unspecified: Secondary | ICD-10-CM

## 2019-12-10 DIAGNOSIS — I10 Essential (primary) hypertension: Secondary | ICD-10-CM

## 2019-12-23 ENCOUNTER — Ambulatory Visit: Payer: Medicare Other

## 2019-12-23 DIAGNOSIS — J449 Chronic obstructive pulmonary disease, unspecified: Secondary | ICD-10-CM

## 2019-12-23 DIAGNOSIS — F419 Anxiety disorder, unspecified: Secondary | ICD-10-CM

## 2019-12-23 DIAGNOSIS — E785 Hyperlipidemia, unspecified: Secondary | ICD-10-CM

## 2019-12-23 DIAGNOSIS — I10 Essential (primary) hypertension: Secondary | ICD-10-CM

## 2019-12-23 NOTE — Patient Instructions (Addendum)
Visit Information  Goals Addressed            This Visit's Progress   . PharmD Care Plan       CARE PLAN ENTRY Current Barriers:  . Chronic Disease Management support, education, and care coordination needs related to Hypertension, Hyperlipidemia, COPD, Anxiety.   Hypertension . Pharmacist Clinical Goal(s): o Over the next 180 days, patient will work with PharmD and providers to maintain BP goal <140/90 . Current regimen:  o Currently managed with diet alone . Interventions: o No at this time. . Patient self care activities - Over the next 180 days, patient will: o Check BP once weekly, document, and provide at future appointments o Ensure daily salt intake < 2300 mg/day  Hyperlipidemia . Pharmacist Clinical Goal(s): o Over the next 180 days, patient will work with PharmD and providers to maintain LDL goal <100 . Current regimen:  o No medications at this time, managed on diet alone . Interventions: o None. Will continue to monitor. . Patient self care activities - Over the next 180 days, patient will: o Continue with current management.  COPD . Pharmacist Clinical Goal(s) o Over the next 180 days, patient will work with PharmD and providers to minimize recurring symptoms of shortness of breath . Current regimen:  o Duoneb 0.5-2.5 mg/67mL every six hours  . Interventions: o Has been using Duoneb twice daily, recommend increase to four times daily . Patient self care activities - Over the next 180 days, patient will: o Increase Duoneb 0.5-2.5 mg/108mL to four times daily  Anxiety . Pharmacist Clinical Goal(s) o Over the next 180 days, patient will work with PharmD and providers to minimize recurring symptoms of anxiety . Current regimen:  o Sertraline 25 mg daily o Buspirone 7.5 mg   . Interventions: o Next month 01/2020 we will discussing increasing sertraline to 50 mg daily  . Patient self care activities - Over the next 30 days, patient will: o Call with any questions  and report any worsening symptoms of anxiety. Medication management . Pharmacist Clinical Goal(s): o Over the next 180 days, patient will work with PharmD and providers to maintain optimal medication adherence . Current pharmacy: [facility]  . Interventions o Comprehensive medication review performed. o Utilize [facility] for medication synchronization, packaging and delivery . Patient self care activities - Over the next 180 days, patient will: o Take medications as prescribed o Report any questions or concerns to PharmD and/or provider(s)  Initial goal documentation.    Christopher Pena was given information about Chronic Care Management services today including:  1. CCM service includes personalized support from designated clinical staff supervised by his physician, including individualized plan of care and coordination with other care providers 2. 24/7 contact phone numbers for assistance for urgent and routine care needs. 3. Standard insurance, coinsurance, copays and deductibles apply for chronic care management only during months in which we provide at least 20 minutes of these services. Most insurances cover these services at 100%, however patients may be responsible for any copay, coinsurance and/or deductible if applicable. This service may help you avoid the need for more expensive face-to-face services. 4. Only one practitioner may furnish and bill the service in a calendar month. 5. The patient may stop CCM services at any time (effective at the end of the month) by phone call to the office staff.  Patient agreed to services and verbal consent obtained.   The patient verbalized understanding of instructions provided today and agreed  to receive a mailed copy of patient instruction and/or educational materials. Telephone follow up appointment with pharmacy team member scheduled for: See next appointment with "Care Management Staff" under "What's Next" below.   Thank  you!  Dahlia Byes, Pharm.D. Clinical Pharmacist [facility] [phone]  Chronic Obstructive Pulmonary Disease  Chronic obstructive pulmonary disease (COPD) is a long-term (chronic) condition that affects the lungs. COPD is a general term that can be used to describe many different lung problems that cause lung swelling (inflammation) and limit airflow, including chronic bronchitis and emphysema. If you have COPD, your lung function will probably never return to normal. In most cases, it gets worse over time. However, there are steps you can take to slow the progression of the disease and improve your quality of life. What are the causes? This condition may be caused by:  Smoking. This is the most common cause.  Certain genes passed down through families. What increases the risk? The following factors may make you more likely to develop this condition:  Secondhand smoke from cigarettes, pipes, or cigars.  Exposure to chemicals and other irritants such as fumes and dust in the work environment.  Chronic lung conditions or infections. What are the signs or symptoms? Symptoms of this condition include:  Shortness of breath, especially during physical activity.  Chronic cough with a large amount of thick mucus. Sometimes the cough may not have any mucus (dry cough).  Wheezing.  Rapid breaths.  Gray or bluish discoloration (cyanosis) of the skin, especially in your fingers, toes, or lips.  Feeling tired (fatigue).  Weight loss.  Chest tightness.  Frequent infections.  Episodes when breathing symptoms become much worse (exacerbations).  Swelling in the ankles, feet, or legs. This may occur in later stages of the disease. How is this diagnosed? This condition is diagnosed based on:  Your medical history.  A physical exam. You may also have tests, including:  Lung (pulmonary) function tests. This may include a spirometry test, which  measures your ability to exhale properly.  Chest X-ray.  CT scan.  Blood tests. How is this treated? This condition may be treated with:  Medicines. These may include inhaled rescue medicines to treat acute exacerbations as well as long-term, or maintenance, medicines to prevent flare-ups of COPD. ? Bronchodilators help treat COPD by dilating the airways to allow increased airflow and make your breathing more comfortable. ? Steroids can reduce airway inflammation and help prevent exacerbations.  Smoking cessation. If you smoke, your health care provider may ask you to quit, and may also recommend therapy or replacement products to help you quit.  Pulmonary rehabilitation. This may involve working with a team of health care providers and specialists, such as respiratory, occupational, and physical therapists.  Exercise and physical activity. These are beneficial for nearly all people with COPD.  Nutrition therapy to gain weight, if you are underweight.  Oxygen. Supplemental oxygen therapy is only helpful if you have a low oxygen level in your blood (hypoxemia).  Lung surgery or transplant.  Palliative care. This is to help people with COPD feel comfortable when treatment is no longer working. Follow these instructions at home: Medicines  Take over-the-counter and prescription medicines (inhaled or pills) only as told by your health care provider.  Talk to your health care provider before taking any cough or allergy medicines. You may need to avoid certain medicines that dry out your airways. Lifestyle  If you are a smoker, the most important  thing that you can do is to stop smoking. Do not use any products that contain nicotine or tobacco, such as cigarettes and e-cigarettes. If you need help quitting, ask your health care provider. Continuing to smoke will cause the disease to progress faster.  Avoid exposure to things that irritate your lungs, such as smoke, chemicals, and  fumes.  Stay active, but balance activity with periods of rest. Exercise and physical activity will help you maintain your ability to do things you want to do.  Learn and use relaxation techniques to manage stress and to control your breathing.  Get the right amount of sleep and get quality sleep. Most adults need 7 or more hours per night.  Eat healthy foods. Eating smaller, more frequent meals and resting before meals may help you maintain your strength. Controlled breathing Learn and use controlled breathing techniques as directed by your health care provider. Controlled breathing techniques include:  Pursed lip breathing. Start by breathing in (inhaling) through your nose for 1 second. Then, purse your lips as if you were going to whistle and breathe out (exhale) through the pursed lips for 2 seconds.  Diaphragmatic breathing. Start by putting one hand on your abdomen just above your waist. Inhale slowly through your nose. The hand on your abdomen should move out. Then purse your lips and exhale slowly. You should be able to feel the hand on your abdomen moving in as you exhale. Controlled coughing Learn and use controlled coughing to clear mucus from your lungs. Controlled coughing is a series of short, progressive coughs. The steps of controlled coughing are: 1. Lean your head slightly forward. 2. Breathe in deeply using diaphragmatic breathing. 3. Try to hold your breath for 3 seconds. 4. Keep your mouth slightly open while coughing twice. 5. Spit any mucus out into a tissue. 6. Rest and repeat the steps once or twice as needed. General instructions  Make sure you receive all the vaccines that your health care provider recommends, especially the pneumococcal and influenza vaccines. Preventing infection and hospitalization is very important when you have COPD.  Use oxygen therapy and pulmonary rehabilitation if directed to by your health care provider. If you require home oxygen  therapy, ask your health care provider whether you should purchase a pulse oximeter to measure your oxygen level at home.  Work with your health care provider to develop a COPD action plan. This will help you know what steps to take if your condition gets worse.  Keep other chronic health conditions under control as told by your health care provider.  Avoid extreme temperature and humidity changes.  Avoid contact with people who have an illness that spreads from person to person (is contagious), such as viral infections or pneumonia.  Keep all follow-up visits as told by your health care provider. This is important. Contact a health care provider if:  You are coughing up more mucus than usual.  There is a change in the color or thickness of your mucus.  Your breathing is more labored than usual.  Your breathing is faster than usual.  You have difficulty sleeping.  You need to use your rescue medicines or inhalers more often than expected.  You have trouble doing routine activities such as getting dressed or walking around the house. Get help right away if:  You have shortness of breath while you are resting.  You have shortness of breath that prevents you from: ? Being able to talk. ? Performing your usual physical activities.  You have chest pain lasting longer than 5 minutes.  Your skin color is more blue (cyanotic) than usual.  You measure low oxygen saturations for longer than 5 minutes with a pulse oximeter.  You have a fever.  You feel too tired to breathe normally. Summary  Chronic obstructive pulmonary disease (COPD) is a long-term (chronic) condition that affects the lungs.  Your lung function will probably never return to normal. In most cases, it gets worse over time. However, there are steps you can take to slow the progression of the disease and improve your quality of life.  Treatment for COPD may include taking medicines, quitting smoking, pulmonary  rehabilitation, and changes to diet and exercise. As the disease progresses, you may need oxygen therapy, a lung transplant, or palliative care.  To help manage your condition, do not smoke, avoid exposure to things that irritate your lungs, stay up to date on all vaccines, and follow your health care provider's instructions for taking medicines. This information is not intended to replace advice given to you by your health care provider. Make sure you discuss any questions you have with your health care provider. Document Revised: 07/18/2017 Document Reviewed: 09/09/2016 Elsevier Patient Education  2020 [employer].

## 2019-12-23 NOTE — Progress Notes (Signed)
Chronic Care Management Pharmacy  Name: Christopher Pena  MRN: [id number] DOB: [birth year]  Chief Complaint/ HPI  Christopher Pena,  74 y.o. , male presents for their Initial CCM visit with the clinical pharmacist via telephone due to COVID-19 Pandemic.  PCP : Sheliah Hatch, MD  Their chronic conditions include: HTN, PVD, COPD, anxiety, HLD.  Outpatient Encounter Medications as of 12/23/2019  Medication Sig  . albuterol (VENTOLIN HFA) 108 (90 Base) MCG/ACT inhaler Inhale 2 puffs into the lungs every 6 (six) hours as needed for wheezing or shortness of breath.  . busPIRone (BUSPAR) 7.5 MG tablet TAKE 1 TABLET BY MOUTH TWICE A DAY  . cilostazol (PLETAL) 100 MG tablet TAKE 1 TABLET BY MOUTH 2 TIMES A DAY BEFORE A MEAL  . clopidogrel (PLAVIX) 75 MG tablet TAKE 1 TABLET BY MOUTH EVERY DAY  . ipratropium-albuterol (DUONEB) 0.5-2.5 (3) MG/3ML SOLN Take 3 mLs by nebulization every 6 (six) hours as needed.  Marland Kitchen oxyCODONE (ROXICODONE) 15 MG immediate release tablet Take 15 mg by mouth every 4 (four) hours.  . sertraline (ZOLOFT) 25 MG tablet TAKE 1 TABLET BY MOUTH EVERY DAY  . predniSONE (DELTASONE) 10 MG tablet Take  4 each am x 2 days,   2 each am x 2 days,  1 each am x 2 days and stop (Patient not taking: Reported on 09/06/2019)   No facility-administered encounter medications on file as of 12/23/2019.   Current Diagnosis/Assessment: Goals Addressed            This Visit's Progress   . PharmD Care Plan       CARE PLAN ENTRY  Current Barriers:  . Chronic Disease Management support, education, and care coordination needs related to Hypertension, Hyperlipidemia, COPD, Anxiety.   Hypertension . Pharmacist Clinical Goal(s): o Over the next 180 days, patient will work with PharmD and providers to maintain BP goal <140/90 . Current regimen:  o Currently managed with diet alone . Interventions: o No at this time. . Patient self care activities - Over the next 180 days, patient  will: o Check BP once weekly, document, and provide at future appointments o Ensure daily salt intake < 2300 mg/day  Hyperlipidemia . Pharmacist Clinical Goal(s): o Over the next 180 days, patient will work with PharmD and providers to maintain LDL goal <100 . Current regimen:  o No medications at this time, managed on diet alone . Interventions: o None. Will continue to monitor. . Patient self care activities - Over the next 180 days, patient will: o Continue with current management.  COPD . Pharmacist Clinical Goal(s) o Over the next 180 days, patient will work with PharmD and providers to minimize recurring symptoms of shortness of breath . Current regimen:  o Duoneb 0.5-2.5 mg/36mL every six hours  . Interventions: o Has been using Duoneb twice daily, recommend increase to four times daily . Patient self care activities - Over the next 180 days, patient will: o Increase Duoneb 0.5-2.5 mg/8mL to four times daily  Anxiety . Pharmacist Clinical Goal(s) o Over the next 180 days, patient will work with PharmD and providers to minimize recurring symptoms of anxiety . Current regimen:  o Sertraline 25 mg daily o Buspirone 7.5 mg   . Interventions: o Next month 01/2020 we will discussing increasing sertraline to 50 mg daily  . Patient self care activities - Over the next 30 days, patient will: o Call with any questions and report any worsening symptoms of anxiety. Medication management .  Pharmacist Clinical Goal(s): o Over the next 180 days, patient will work with PharmD and providers to maintain optimal medication adherence . Current pharmacy: [facility]  . Interventions o Comprehensive medication review performed. o Utilize [facility] for medication synchronization, packaging and delivery . Patient self care activities - Over the next 180 days, patient will: o Take medications as prescribed o Report any questions or concerns to PharmD and/or provider(s)  Initial  goal documentation.      Hypertension   Office blood pressures are  BP Readings from Last 3 Encounters:  09/06/19 (!) 142/84  04/05/19 124/80  03/31/19 132/77   Patient is currently controlled on the following medications: no medications at this time. Patient home BP readings are ranging: no readings. Denies chest pain, headache, dizziness. Does have SOB related to COPD.    Plan  Continue control with diet and exercise.    Hyperlipidemia / PVD   Lipid Panel     Component Value Date/Time   CHOL 177 04/05/2019 1208   TRIG 117.0 04/05/2019 1208   HDL 51.30 04/05/2019 1208   CHOLHDL 3 04/05/2019 1208   VLDL 23.4 04/05/2019 1208   LDLCALC 103 (H) 04/05/2019 1208   The 10-year ASCVD risk score Mikey Bussing DC Jr., et al., 2013) is: 24.4%   Values used to calculate the score:     Age: 7 years     Sex: Male     Is Non-Hispanic African American: No     Diabetic: No     Tobacco smoker: No     Systolic Blood Pressure: 160 mmHg     Is BP treated: No     HDL Cholesterol: 51.3 mg/dL     Total Cholesterol: 177 mg/dL   Patient has triedthese meds in past: atorvastatin 10 mg 2018-2020 per med Hx.  Patient is currently controlled on the following medications: no medications at this time. Lipid panel at goal last visit.   Is taking clopidogrel 75 mg and cilostazol 100 mg twice daily before meal for PVD. Denies any abnormal bruising, bleeding from nose or gums or blood in urine or stool. Denies any worsening pain in legs.   Plan  Continue current management. Will continue to monitor.    COPD   Gold Grade: Gold 3 (FEV1 30-49%)  Tobacco Status:  Social History   Tobacco Use  Smoking Status Former Smoker  . Packs/day: 0.25  . Years: 50.00  . Pack years: 12.50  . Types: Cigarettes  . Start date: 11/14/2015  . Quit date: 11/13/2016  . Years since quitting: 3.1  Smokeless Tobacco Never Used   Reports SOB has worsened over last 6 months, uses electric cart at store. Oxygen for last  year, 20 hrs per day now. Has not been strict with albuterol/ipratropium, only using twice daily due to feeling jittery at times. Mentions challenge of having to prepare nebulizer each time. We discussed using four times daily and patient agrees to increase frequency of use moving forward to see if this will improve baseline SOB.  Patient is currently controlled on the following medications: albuterol (ventolin) inhaler - rarely used, has not used in at least 2 years. Duoneb 0.5-2.5 mg/30mL Q6H. Using maintenance inhaler regularly? No. Frequency of rescue inhaler use: never.   Plan  Increase Duoneb 0.5-2.5 mg/5mL to four times daily.   Anxiety   Patient is currently controlled on sertraline  25 mg daily, buspirone 7.5 mg twice daily  Reports continued benefit with current regimen. Denies any side effects  at this time. Expresses that he might want to consider increasing dose. Will consider over next month.   Plan  Continue current medication. Will discuss sertraline done increase at next month f/u visit.   Vaccines   Reviewed vaccination history. Is due for Shingrix.    Immunization History  Administered Date(s) Administered  . Fluad Quad(high Dose 65+) 04/15/2019  . Influenza Whole 06/10/2016  . Influenza, High Dose Seasonal PF 05/11/2014, 06/16/2018  . Influenza-Unspecified 05/19/2013, 06/16/2018  . PFIZER SARS-COV-2 Vaccination 10/02/2019, 10/25/2019  . Pneumococcal Conjugate-13 05/13/2016   Plan  Recommended patient receive Shingrix vaccine in office/pharmacy.   Pain   Patient is currently controlled on the following medications: oxycodone IR 15 mg every 4 hours. Chronic use x 15 years. Denies dizziness or drowsiness at this time.  Plan  Continue current medications.   Medication Management   Pt uses [facility] for all medications. We discussed options for medication home deliver and med synch.  Plan  Utilize [facility] for medication synchronization,  packaging and delivery.  Follow up: 1 month phone visit  ______________ Visit Information SDOH (Social Determinants of Health) assessments performed: Yes.   Food Insecurity: No Food Insecurity  . Worried About Programme researcher, broadcasting/film/video in the Last Year: Never true  . Ran Out of Food in the Last Year: Never true     Transportation Needs: No Transportation Needs  . Lack of Transportation (Medical): No  . Lack of Transportation (Non-Medical): No    Mr. Crabbe was given information about Chronic Care Management services today including:  1. CCM service includes personalized support from designated clinical staff supervised by his physician, including individualized plan of care and coordination with other care providers 2. 24/7 contact phone numbers for assistance for urgent and routine care needs. 3. Standard insurance, coinsurance, copays and deductibles apply for chronic care management only during months in which we provide at least 20 minutes of these services. Most insurances cover these services at 100%, however patients may be responsible for any copay, coinsurance and/or deductible if applicable. This service may help you avoid the need for more expensive face-to-face services. 4. Only one practitioner may furnish and bill the service in a calendar month. 5. The patient may stop CCM services at any time (effective at the end of the month) by phone call to the office staff.  Patient agreed to services and verbal consent obtained.   Verbal consent obtained for [facility] enhanced pharmacy services (medication synchronization, adherence packaging, delivery coordination). A medication sync plan was created to allow patient to get all medications delivered once every 30 to 90 days per patient preference. Patient understands they have freedom to choose pharmacy and clinical pharmacist will coordinate care between all prescribers and [facility].  Dahlia Byes, Pharm.D. Clinical  Pharmacist [facility] [phone]

## 2019-12-24 ENCOUNTER — Other Ambulatory Visit: Payer: Self-pay | Admitting: General Practice

## 2019-12-24 MED ORDER — BUSPIRONE HCL 7.5 MG PO TABS: 7.5000 mg | ORAL_TABLET | Freq: Two times a day (BID) | ORAL | 1 refills | Status: DC

## 2019-12-24 MED ORDER — SERTRALINE HCL 25 MG PO TABS: 25.0000 mg | ORAL_TABLET | Freq: Every day | ORAL | 1 refills | Status: DC

## 2019-12-25 DIAGNOSIS — J441 Chronic obstructive pulmonary disease with (acute) exacerbation: Secondary | ICD-10-CM | POA: Diagnosis not present

## 2019-12-25 DIAGNOSIS — J9621 Acute and chronic respiratory failure with hypoxia: Secondary | ICD-10-CM | POA: Diagnosis not present

## 2019-12-30 DIAGNOSIS — M545 Low back pain: Secondary | ICD-10-CM | POA: Diagnosis not present

## 2019-12-30 DIAGNOSIS — Z79891 Long term (current) use of opiate analgesic: Secondary | ICD-10-CM | POA: Diagnosis not present

## 2019-12-30 DIAGNOSIS — M5431 Sciatica, right side: Secondary | ICD-10-CM | POA: Diagnosis not present

## 2019-12-30 DIAGNOSIS — G894 Chronic pain syndrome: Secondary | ICD-10-CM | POA: Diagnosis not present

## 2019-12-30 DIAGNOSIS — G8929 Other chronic pain: Secondary | ICD-10-CM | POA: Diagnosis not present

## 2019-12-30 DIAGNOSIS — M79604 Pain in right leg: Secondary | ICD-10-CM | POA: Diagnosis not present

## 2019-12-30 DIAGNOSIS — M79605 Pain in left leg: Secondary | ICD-10-CM | POA: Diagnosis not present

## 2020-01-20 ENCOUNTER — Other Ambulatory Visit: Payer: Self-pay

## 2020-01-20 ENCOUNTER — Telehealth: Payer: Self-pay | Admitting: Internal Medicine

## 2020-01-20 DIAGNOSIS — I779 Disorder of arteries and arterioles, unspecified: Secondary | ICD-10-CM

## 2020-01-20 MED ORDER — ALBUTEROL SULFATE HFA 108 (90 BASE) MCG/ACT IN AERS: 2.0000 | INHALATION_SPRAY | Freq: Four times a day (QID) | RESPIRATORY_TRACT | 3 refills | Status: DC | PRN

## 2020-01-20 MED ORDER — CLOPIDOGREL BISULFATE 75 MG PO TABS: 75.0000 mg | ORAL_TABLET | Freq: Every day | ORAL | 1 refills | Status: DC

## 2020-01-20 MED ORDER — CILOSTAZOL 100 MG PO TABS: ORAL_TABLET | ORAL | 2 refills | Status: DC

## 2020-01-20 NOTE — Telephone Encounter (Signed)
Called and spoke with [facility] refill has been sent. They confirmed they received it. Nothing further needed at this time

## 2020-01-21 ENCOUNTER — Other Ambulatory Visit: Payer: Self-pay

## 2020-01-21 ENCOUNTER — Telehealth: Payer: Medicare Other

## 2020-01-21 NOTE — Progress Notes (Unsigned)
Chronic Care Management Pharmacy  Name: Christopher Pena  MRN: [id number] DOB: 1946/03/07  Chief Complaint/ HPI  Christopher Pena,  74 y.o. , male presents for their Initial CCM visit with the clinical pharmacist via telephone due to COVID-19 Pandemic.  PCP : Sheliah Hatch, MD  Their chronic conditions include: HTN, PVD, COPD, anxiety, HLD.  Outpatient Encounter Medications as of 01/21/2020  Medication Sig  . albuterol (VENTOLIN HFA) 108 (90 Base) MCG/ACT inhaler Inhale 2 puffs into the lungs every 6 (six) hours as needed for wheezing or shortness of breath.  . busPIRone (BUSPAR) 7.5 MG tablet Take 1 tablet (7.5 mg total) by mouth 2 (two) times daily.  . cilostazol (PLETAL) 100 MG tablet TAKE 1 TABLET BY MOUTH 2 TIMES A DAY BEFORE A MEAL  . clopidogrel (PLAVIX) 75 MG tablet Take 1 tablet (75 mg total) by mouth daily.  Marland Kitchen ipratropium-albuterol (DUONEB) 0.5-2.5 (3) MG/3ML SOLN Take 3 mLs by nebulization every 6 (six) hours as needed.  Marland Kitchen oxyCODONE (ROXICODONE) 15 MG immediate release tablet Take 15 mg by mouth every 4 (four) hours.  . predniSONE (DELTASONE) 10 MG tablet Take  4 each am x 2 days,   2 each am x 2 days,  1 each am x 2 days and stop (Patient not taking: Reported on 09/06/2019)  . sertraline (ZOLOFT) 25 MG tablet Take 1 tablet (25 mg total) by mouth daily.   No facility-administered encounter medications on file as of 01/21/2020.   Current Diagnosis/Assessment: Goals Addressed   None    Hypertension   Office blood pressures are  BP Readings from Last 3 Encounters:  09/06/19 (!) 142/84  04/05/19 124/80  03/31/19 132/77   Patient is currently controlled on the following medications: no medications at this time. Patient home BP readings are ranging: no readings. Denies chest pain, headache, dizziness. Does have SOB related to COPD.    Plan  Continue control with diet and exercise.    Hyperlipidemia / PVD   Lipid Panel     Component Value Date/Time   CHOL 177  04/05/2019 1208   TRIG 117.0 04/05/2019 1208   HDL 51.30 04/05/2019 1208   CHOLHDL 3 04/05/2019 1208   VLDL 23.4 04/05/2019 1208   LDLCALC 103 (H) 04/05/2019 1208   The 10-year ASCVD risk score Denman George DC Jr., et al., 2013) is: 24.4%   Values used to calculate the score:     Age: 34 years     Sex: Male     Is Non-Hispanic African American: No     Diabetic: No     Tobacco smoker: No     Systolic Blood Pressure: 142 mmHg     Is BP treated: No     HDL Cholesterol: 51.3 mg/dL     Total Cholesterol: 177 mg/dL   Patient has triedthese meds in past: atorvastatin 10 mg 2018-2020 per med Hx.  Patient is currently controlled on the following medications: no medications at this time. Lipid panel at goal last visit.   Is taking clopidogrel 75 mg and cilostazol 100 mg twice daily before meal for PVD. Denies any abnormal bruising, bleeding from nose or gums or blood in urine or stool. Denies any worsening pain in legs.   Plan  Continue current management. Will continue to monitor.    COPD   Gold Grade: Gold 3 (FEV1 30-49%)  Tobacco Status:  Social History   Tobacco Use  Smoking Status Former Smoker  . Packs/day: 0.25  . Years:  50.00  . Pack years: 12.50  . Types: Cigarettes  . Start date: 11/14/2015  . Quit date: 11/13/2016  . Years since quitting: 3.1  Smokeless Tobacco Never Used   CAT ASSESSMENT  Rank each of the following items on a scale of 0 to 5 (with 5 being most severe) Write a # 0-5 in each box  I never cough (0) > I cough all the time (5) {Numbers; 0-5:140013}  I have no phlegm (mucus) in my chest (0) > My chest is completely full of phlegm (mucus) (5) {Numbers; 0-5:140013}  My chest does not feel tight at all (0) > My chest feels very tight (5) {Numbers; 0-5:140013}  When I walk up a hill or one flight of stairs I am not breathless (0) > When I walk up a hill or one flight of stairs I am very breathless (5) {Numbers; 0-5:140013}  I am not limited doing any activities at  home (0) > I am very limited doing activities at home (5) {Numbers; 0-5:140013}  I am confident leaving my home despite my lung function (0) > I am not at all confident leaving my home because of my lung condition (5)  {Numbers; 0-5:140013}  I sleep soundly (0) > I don't sleep soundly because of my lung condition (5) {Numbers; 0-5:140013}  I have lots of energy (0) > I have no energy at all (5) {Numbers; 0-5:140013}   Total CAT Score: *** Reports SOB has worsened over last 6 months, uses electric cart at store. Oxygen for last year, 20 hrs per day now. Has not been strict with albuterol/ipratropium, only using twice daily due to feeling jittery at times. Mentions challenge of having to prepare nebulizer each time. We discussed using four times daily and patient agrees to increase frequency of use moving forward to see if this will improve baseline SOB.  Patient is currently controlled on the following medications: albuterol (ventolin) inhaler - rarely used, has not used in at least 2 years. Duoneb 0.5-2.5 mg/42mL Q6H. Using maintenance inhaler regularly? No. Frequency of rescue inhaler use: never.   Plan  Increase Duoneb 0.5-2.5 mg/44mL to four times daily.   Anxiety   Patient is currently controlled on sertraline  25 mg daily, buspirone 7.5 mg twice daily  Reports continued benefit with current regimen. Denies any side effects at this time. Expresses that he might want to consider increasing dose. Will consider over next month.   Plan  Continue current medication. Will discuss sertraline done increase at next month f/u visit.   Vaccines   Reviewed vaccination history. Is due for Shingrix.    Immunization History  Administered Date(s) Administered  . Fluad Quad(high Dose 65+) 04/15/2019  . Influenza Whole 06/10/2016  . Influenza, High Dose Seasonal PF 05/11/2014, 06/16/2018  . Influenza-Unspecified 05/19/2013, 06/16/2018  . PFIZER SARS-COV-2 Vaccination 10/02/2019, 10/25/2019  .  Pneumococcal Conjugate-13 05/13/2016   Plan  Recommended patient receive Shingrix vaccine in office/pharmacy.   Pain   Patient is currently controlled on the following medications: oxycodone IR 15 mg every 4 hours. Chronic use x 15 years. Denies dizziness or drowsiness at this time.  Plan  Continue current medications.   Medication Management   [facility] for all medications. We discussed options for medication home deliver and med synch.  Plan  Utilize [facility] for medication synchronization, packaging and delivery.  Follow up: 1 month phone visit  ______________ Visit Information SDOH (Social Determinants of Health) assessments performed: Yes.   Food  Insecurity: No Food Insecurity  . Worried About Charity fundraiser in the Last Year: Never true  . Ran Out of Food in the Last Year: Never true     Transportation Needs: No Transportation Needs  . Lack of Transportation (Medical): No  . Lack of Transportation (Non-Medical): No    Mr. Falwell was given information about Chronic Care Management services today including:  1. CCM service includes personalized support from designated clinical staff supervised by his physician, including individualized plan of care and coordination with other care providers 2. 24/7 contact phone numbers for assistance for urgent and routine care needs. 3. Standard insurance, coinsurance, copays and deductibles apply for chronic care management only during months in which we provide at least 20 minutes of these services. Most insurances cover these services at 100%, however patients may be responsible for any copay, coinsurance and/or deductible if applicable. This service may help you avoid the need for more expensive face-to-face services. 4. Only one practitioner may furnish and bill the service in a calendar month. 5. The patient may stop CCM services at any time (effective at the end of the month) by phone call to the office  staff.  Patient agreed to services and verbal consent obtained.   Verbal consent obtained for [facility] enhanced pharmacy services (medication synchronization, adherence packaging, delivery coordination). A medication sync plan was created to allow patient to get all medications delivered once every 30 to 90 days per patient preference. Patient understands they have freedom to choose pharmacy and clinical pharmacist will coordinate care between all prescribers and [facility].  Madelin Rear, Pharm.D. Clinical Pharmacist [facility] at [facility] [phone]

## 2020-01-25 DIAGNOSIS — J9621 Acute and chronic respiratory failure with hypoxia: Secondary | ICD-10-CM | POA: Diagnosis not present

## 2020-01-25 DIAGNOSIS — J441 Chronic obstructive pulmonary disease with (acute) exacerbation: Secondary | ICD-10-CM | POA: Diagnosis not present

## 2020-01-26 ENCOUNTER — Other Ambulatory Visit: Payer: Self-pay | Admitting: Family Medicine

## 2020-01-26 ENCOUNTER — Other Ambulatory Visit: Payer: Self-pay | Admitting: Vascular Surgery

## 2020-01-26 DIAGNOSIS — I779 Disorder of arteries and arterioles, unspecified: Secondary | ICD-10-CM

## 2020-01-27 DIAGNOSIS — G8929 Other chronic pain: Secondary | ICD-10-CM | POA: Diagnosis not present

## 2020-01-27 DIAGNOSIS — Z79891 Long term (current) use of opiate analgesic: Secondary | ICD-10-CM | POA: Diagnosis not present

## 2020-01-27 DIAGNOSIS — M5431 Sciatica, right side: Secondary | ICD-10-CM | POA: Diagnosis not present

## 2020-01-27 DIAGNOSIS — M79604 Pain in right leg: Secondary | ICD-10-CM | POA: Diagnosis not present

## 2020-01-27 DIAGNOSIS — M545 Low back pain: Secondary | ICD-10-CM | POA: Diagnosis not present

## 2020-01-27 DIAGNOSIS — G894 Chronic pain syndrome: Secondary | ICD-10-CM | POA: Diagnosis not present

## 2020-01-27 DIAGNOSIS — M79605 Pain in left leg: Secondary | ICD-10-CM | POA: Diagnosis not present

## 2020-01-27 DIAGNOSIS — M5432 Sciatica, left side: Secondary | ICD-10-CM | POA: Diagnosis not present

## 2020-01-28 ENCOUNTER — Other Ambulatory Visit: Payer: Self-pay

## 2020-01-28 ENCOUNTER — Telehealth: Payer: Medicare Other

## 2020-01-31 ENCOUNTER — Ambulatory Visit: Payer: Medicare Other

## 2020-01-31 ENCOUNTER — Other Ambulatory Visit: Payer: Self-pay

## 2020-01-31 NOTE — Progress Notes (Signed)
error 

## 2020-02-11 ENCOUNTER — Other Ambulatory Visit: Payer: Self-pay | Admitting: Internal Medicine

## 2020-02-11 DIAGNOSIS — J449 Chronic obstructive pulmonary disease, unspecified: Secondary | ICD-10-CM

## 2020-02-14 ENCOUNTER — Telehealth: Payer: Self-pay

## 2020-02-14 ENCOUNTER — Ambulatory Visit: Payer: Self-pay

## 2020-02-14 NOTE — Telephone Encounter (Signed)
  Chronic Care Management   Outreach Note   Name: Christopher Pena MRN: [id number] DOB: [birth year]  Referred by: Sheliah Hatch, MD Reason for referral: Telephone Appointment with [facility] Clinical Pharmacist, Dahlia Byes.    An unsuccessful telephone outreach was attempted today. Attempted to confirm pharmacy change and reschedule ccm f.u. appointment with pharmacist.  If patient returns call, please provide number below to reschedule visit.   Dahlia Byes, Pharm.D., BCGP Clinical Pharmacist [facility] at [facility] [phone]

## 2020-02-14 NOTE — Progress Notes (Signed)
No action required.   Reviewed patient's [facility] medication and Epic medication profile assuring there are no discrepancies or gaps in therapy. No changes in therapy noted. No adherence gaps noted.  Upon review of dispense history, all [facility] due not being able to reach after several attempts.

## 2020-02-24 DIAGNOSIS — G8929 Other chronic pain: Secondary | ICD-10-CM | POA: Diagnosis not present

## 2020-02-24 DIAGNOSIS — M79605 Pain in left leg: Secondary | ICD-10-CM | POA: Diagnosis not present

## 2020-02-24 DIAGNOSIS — J9621 Acute and chronic respiratory failure with hypoxia: Secondary | ICD-10-CM | POA: Diagnosis not present

## 2020-02-24 DIAGNOSIS — M5432 Sciatica, left side: Secondary | ICD-10-CM | POA: Diagnosis not present

## 2020-02-24 DIAGNOSIS — J441 Chronic obstructive pulmonary disease with (acute) exacerbation: Secondary | ICD-10-CM | POA: Diagnosis not present

## 2020-02-24 DIAGNOSIS — M545 Low back pain: Secondary | ICD-10-CM | POA: Diagnosis not present

## 2020-02-24 DIAGNOSIS — G894 Chronic pain syndrome: Secondary | ICD-10-CM | POA: Diagnosis not present

## 2020-02-24 DIAGNOSIS — M79604 Pain in right leg: Secondary | ICD-10-CM | POA: Diagnosis not present

## 2020-02-24 DIAGNOSIS — Z79891 Long term (current) use of opiate analgesic: Secondary | ICD-10-CM | POA: Diagnosis not present

## 2020-03-23 DIAGNOSIS — G894 Chronic pain syndrome: Secondary | ICD-10-CM | POA: Diagnosis not present

## 2020-03-23 DIAGNOSIS — G8929 Other chronic pain: Secondary | ICD-10-CM | POA: Diagnosis not present

## 2020-03-23 DIAGNOSIS — Z79891 Long term (current) use of opiate analgesic: Secondary | ICD-10-CM | POA: Diagnosis not present

## 2020-03-23 DIAGNOSIS — M5431 Sciatica, right side: Secondary | ICD-10-CM | POA: Diagnosis not present

## 2020-03-23 DIAGNOSIS — M79605 Pain in left leg: Secondary | ICD-10-CM | POA: Diagnosis not present

## 2020-03-23 DIAGNOSIS — M79604 Pain in right leg: Secondary | ICD-10-CM | POA: Diagnosis not present

## 2020-03-23 DIAGNOSIS — M545 Low back pain: Secondary | ICD-10-CM | POA: Diagnosis not present

## 2020-03-26 DIAGNOSIS — J9621 Acute and chronic respiratory failure with hypoxia: Secondary | ICD-10-CM | POA: Diagnosis not present

## 2020-03-26 DIAGNOSIS — J441 Chronic obstructive pulmonary disease with (acute) exacerbation: Secondary | ICD-10-CM | POA: Diagnosis not present

## 2020-04-10 ENCOUNTER — Telehealth: Payer: Self-pay | Admitting: Internal Medicine

## 2020-04-10 DIAGNOSIS — J449 Chronic obstructive pulmonary disease, unspecified: Secondary | ICD-10-CM

## 2020-04-10 NOTE — Telephone Encounter (Signed)
Spoke with the pt  He states that he received a prescription from [facility] for ventolin  He is unsure why- looks like in June we got a call from the pharm asking for refill  This was previously prescribed by Dr Beverely Low in 2017  He states does not feel that he needs it, but wants to make sure that it's ok with Dr Sherene Sires  Also- asking when does MW rec that he get his flu shot and also if he would be candidate for the covid booster  Please advise, thanks!

## 2020-04-11 MED ORDER — PREDNISONE 10 MG PO TABS: ORAL_TABLET | ORAL | 11 refills | Status: DC

## 2020-04-11 NOTE — Telephone Encounter (Signed)
Ok Prednisone 10 mg take  4 each am x 2 days,   2 each am x 2 days,  1 each am x 2 days and stop  

## 2020-04-11 NOTE — Telephone Encounter (Addendum)
Called and spoke with pt letting him know the info stated by MW and he verbalized understanding. While speaking with pt, pt wanted to know if he could get a refill of prednisone for him to have in case he needed it. Dr. Sherene Sires, please advise if you are okay with this being sent to pharmacy for pt and if so, with what instructions.

## 2020-04-11 NOTE — Telephone Encounter (Signed)
Ventolin is like a spare tire and he should keep it on hand   Review: Only use it as a rescue medication to be used if you can't catch your breath by resting or doing a relaxed purse lip breathing pattern.  - The less you use it, the better it will work when you need it. - Ok to use up to 2 puffs  every 4 hours if you must but call for immediate appointment if use goes up over your usual need - Don't leave home without it !!  (think of it like the spare tire for your car)    Flu shot per PCP yearly should be continued   The booster is not yet approved for him but likely will be by 1st of October so stay tuned

## 2020-04-11 NOTE — Addendum Note (Signed)
Addended by: Wyvonne Lenz on: 04/11/2020 05:18 PM   Modules accepted: Orders

## 2020-04-11 NOTE — Telephone Encounter (Signed)
Rx for prednisone has been sent to pharmacy. Nothing further needed.

## 2020-04-20 DIAGNOSIS — Z79891 Long term (current) use of opiate analgesic: Secondary | ICD-10-CM | POA: Diagnosis not present

## 2020-04-20 DIAGNOSIS — M5432 Sciatica, left side: Secondary | ICD-10-CM | POA: Diagnosis not present

## 2020-04-20 DIAGNOSIS — M545 Low back pain: Secondary | ICD-10-CM | POA: Diagnosis not present

## 2020-04-20 DIAGNOSIS — G8929 Other chronic pain: Secondary | ICD-10-CM | POA: Diagnosis not present

## 2020-04-20 DIAGNOSIS — G894 Chronic pain syndrome: Secondary | ICD-10-CM | POA: Diagnosis not present

## 2020-04-20 DIAGNOSIS — M79604 Pain in right leg: Secondary | ICD-10-CM | POA: Diagnosis not present

## 2020-04-20 DIAGNOSIS — M5431 Sciatica, right side: Secondary | ICD-10-CM | POA: Diagnosis not present

## 2020-04-20 DIAGNOSIS — M79605 Pain in left leg: Secondary | ICD-10-CM | POA: Diagnosis not present

## 2020-04-26 DIAGNOSIS — J441 Chronic obstructive pulmonary disease with (acute) exacerbation: Secondary | ICD-10-CM | POA: Diagnosis not present

## 2020-04-26 DIAGNOSIS — J9621 Acute and chronic respiratory failure with hypoxia: Secondary | ICD-10-CM | POA: Diagnosis not present

## 2020-05-18 DIAGNOSIS — M545 Low back pain: Secondary | ICD-10-CM | POA: Diagnosis not present

## 2020-05-18 DIAGNOSIS — M5431 Sciatica, right side: Secondary | ICD-10-CM | POA: Diagnosis not present

## 2020-05-18 DIAGNOSIS — M79605 Pain in left leg: Secondary | ICD-10-CM | POA: Diagnosis not present

## 2020-05-18 DIAGNOSIS — M79604 Pain in right leg: Secondary | ICD-10-CM | POA: Diagnosis not present

## 2020-05-18 DIAGNOSIS — G8929 Other chronic pain: Secondary | ICD-10-CM | POA: Diagnosis not present

## 2020-05-18 DIAGNOSIS — Z79891 Long term (current) use of opiate analgesic: Secondary | ICD-10-CM | POA: Diagnosis not present

## 2020-05-18 DIAGNOSIS — G894 Chronic pain syndrome: Secondary | ICD-10-CM | POA: Diagnosis not present

## 2020-05-18 DIAGNOSIS — M5432 Sciatica, left side: Secondary | ICD-10-CM | POA: Diagnosis not present

## 2020-05-26 DIAGNOSIS — J441 Chronic obstructive pulmonary disease with (acute) exacerbation: Secondary | ICD-10-CM | POA: Diagnosis not present

## 2020-05-26 DIAGNOSIS — J9621 Acute and chronic respiratory failure with hypoxia: Secondary | ICD-10-CM | POA: Diagnosis not present

## 2020-06-15 DIAGNOSIS — G8929 Other chronic pain: Secondary | ICD-10-CM | POA: Diagnosis not present

## 2020-06-15 DIAGNOSIS — M79604 Pain in right leg: Secondary | ICD-10-CM | POA: Diagnosis not present

## 2020-06-15 DIAGNOSIS — G894 Chronic pain syndrome: Secondary | ICD-10-CM | POA: Diagnosis not present

## 2020-06-15 DIAGNOSIS — Z79891 Long term (current) use of opiate analgesic: Secondary | ICD-10-CM | POA: Diagnosis not present

## 2020-06-15 DIAGNOSIS — M79605 Pain in left leg: Secondary | ICD-10-CM | POA: Diagnosis not present

## 2020-06-15 DIAGNOSIS — M5431 Sciatica, right side: Secondary | ICD-10-CM | POA: Diagnosis not present

## 2020-06-15 DIAGNOSIS — M5432 Sciatica, left side: Secondary | ICD-10-CM | POA: Diagnosis not present

## 2020-06-26 DIAGNOSIS — J9621 Acute and chronic respiratory failure with hypoxia: Secondary | ICD-10-CM | POA: Diagnosis not present

## 2020-06-26 DIAGNOSIS — J441 Chronic obstructive pulmonary disease with (acute) exacerbation: Secondary | ICD-10-CM | POA: Diagnosis not present

## 2020-07-12 DIAGNOSIS — G8929 Other chronic pain: Secondary | ICD-10-CM | POA: Diagnosis not present

## 2020-07-12 DIAGNOSIS — Z79891 Long term (current) use of opiate analgesic: Secondary | ICD-10-CM | POA: Diagnosis not present

## 2020-07-12 DIAGNOSIS — G894 Chronic pain syndrome: Secondary | ICD-10-CM | POA: Diagnosis not present

## 2020-07-12 DIAGNOSIS — M79605 Pain in left leg: Secondary | ICD-10-CM | POA: Diagnosis not present

## 2020-07-12 DIAGNOSIS — M79604 Pain in right leg: Secondary | ICD-10-CM | POA: Diagnosis not present

## 2020-07-12 DIAGNOSIS — M545 Low back pain, unspecified: Secondary | ICD-10-CM | POA: Diagnosis not present

## 2020-07-12 DIAGNOSIS — M5432 Sciatica, left side: Secondary | ICD-10-CM | POA: Diagnosis not present

## 2020-07-12 DIAGNOSIS — G890 Central pain syndrome: Secondary | ICD-10-CM | POA: Diagnosis not present

## 2020-07-14 ENCOUNTER — Other Ambulatory Visit: Payer: Self-pay | Admitting: Vascular Surgery

## 2020-07-14 DIAGNOSIS — I779 Disorder of arteries and arterioles, unspecified: Secondary | ICD-10-CM

## 2020-07-16 ENCOUNTER — Other Ambulatory Visit: Payer: Self-pay | Admitting: Family Medicine

## 2020-07-26 DIAGNOSIS — J9621 Acute and chronic respiratory failure with hypoxia: Secondary | ICD-10-CM | POA: Diagnosis not present

## 2020-07-26 DIAGNOSIS — J441 Chronic obstructive pulmonary disease with (acute) exacerbation: Secondary | ICD-10-CM | POA: Diagnosis not present

## 2020-08-08 ENCOUNTER — Other Ambulatory Visit: Payer: Self-pay

## 2020-08-08 ENCOUNTER — Emergency Department (HOSPITAL_COMMUNITY)
Admission: EM | Admit: 2020-08-08 | Discharge: 2020-08-08 | Disposition: A | Discharge status: Home / Self Care | Payer: Medicare Other | Attending: Emergency Medicine | Admitting: Emergency Medicine

## 2020-08-08 ENCOUNTER — Emergency Department (HOSPITAL_COMMUNITY): Payer: Medicare Other

## 2020-08-08 ENCOUNTER — Encounter (HOSPITAL_COMMUNITY): Payer: Self-pay | Admitting: Emergency Medicine

## 2020-08-08 DIAGNOSIS — R062 Wheezing: Secondary | ICD-10-CM | POA: Diagnosis not present

## 2020-08-08 DIAGNOSIS — Z743 Need for continuous supervision: Secondary | ICD-10-CM | POA: Diagnosis not present

## 2020-08-08 DIAGNOSIS — R0981 Nasal congestion: Secondary | ICD-10-CM | POA: Diagnosis not present

## 2020-08-08 DIAGNOSIS — Z20822 Contact with and (suspected) exposure to covid-19: Secondary | ICD-10-CM | POA: Insufficient documentation

## 2020-08-08 DIAGNOSIS — I10 Essential (primary) hypertension: Secondary | ICD-10-CM | POA: Insufficient documentation

## 2020-08-08 DIAGNOSIS — Z87891 Personal history of nicotine dependence: Secondary | ICD-10-CM | POA: Diagnosis not present

## 2020-08-08 DIAGNOSIS — R0902 Hypoxemia: Secondary | ICD-10-CM | POA: Diagnosis not present

## 2020-08-08 DIAGNOSIS — I714 Abdominal aortic aneurysm, without rupture: Secondary | ICD-10-CM | POA: Diagnosis not present

## 2020-08-08 DIAGNOSIS — R059 Cough, unspecified: Secondary | ICD-10-CM | POA: Diagnosis not present

## 2020-08-08 DIAGNOSIS — R069 Unspecified abnormalities of breathing: Secondary | ICD-10-CM | POA: Diagnosis not present

## 2020-08-08 DIAGNOSIS — R0602 Shortness of breath: Secondary | ICD-10-CM | POA: Diagnosis not present

## 2020-08-08 DIAGNOSIS — J449 Chronic obstructive pulmonary disease, unspecified: Secondary | ICD-10-CM | POA: Insufficient documentation

## 2020-08-08 DIAGNOSIS — Z7901 Long term (current) use of anticoagulants: Secondary | ICD-10-CM | POA: Insufficient documentation

## 2020-08-08 DIAGNOSIS — R911 Solitary pulmonary nodule: Secondary | ICD-10-CM | POA: Diagnosis not present

## 2020-08-08 LAB — CBC WITH DIFFERENTIAL/PLATELET
Abs Immature Granulocytes: 0.12 10*3/uL — ABNORMAL HIGH (ref 0.00–0.07)
Basophils Absolute: 0.1 10*3/uL (ref 0.0–0.1)
Basophils Relative: 0 %
Eosinophils Absolute: 0 10*3/uL (ref 0.0–0.5)
Eosinophils Relative: 0 %
HCT: 45.8 % (ref 39.0–52.0)
Hemoglobin: 14.6 g/dL (ref 13.0–17.0)
Immature Granulocytes: 1 %
Lymphocytes Relative: 4 %
Lymphs Abs: 0.8 10*3/uL (ref 0.7–4.0)
MCH: 29.9 pg (ref 26.0–34.0)
MCHC: 31.9 g/dL (ref 30.0–36.0)
MCV: 93.9 fL (ref 80.0–100.0)
Monocytes Absolute: 1.2 10*3/uL — ABNORMAL HIGH (ref 0.1–1.0)
Monocytes Relative: 6 %
Neutro Abs: 16.8 10*3/uL — ABNORMAL HIGH (ref 1.7–7.7)
Neutrophils Relative %: 89 %
Platelets: 369 10*3/uL (ref 150–400)
RBC: 4.88 MIL/uL (ref 4.22–5.81)
RDW: 13.9 % (ref 11.5–15.5)
WBC: 18.9 10*3/uL — ABNORMAL HIGH (ref 4.0–10.5)
nRBC: 0 % (ref 0.0–0.2)

## 2020-08-08 LAB — COMPREHENSIVE METABOLIC PANEL
ALT: 17 U/L (ref 0–44)
AST: 25 U/L (ref 15–41)
Albumin: 3.8 g/dL (ref 3.5–5.0)
Alkaline Phosphatase: 71 U/L (ref 38–126)
Anion gap: 16 — ABNORMAL HIGH (ref 5–15)
BUN: 6 mg/dL — ABNORMAL LOW (ref 8–23)
CO2: 21 mmol/L — ABNORMAL LOW (ref 22–32)
Calcium: 9 mg/dL (ref 8.9–10.3)
Chloride: 100 mmol/L (ref 98–111)
Creatinine, Ser: 0.89 mg/dL (ref 0.61–1.24)
GFR, Estimated: >60 mL/min (ref >60)
Glucose, Bld: 149 mg/dL — ABNORMAL HIGH (ref 70–99)
Potassium: 3.8 mmol/L (ref 3.5–5.1)
Sodium: 137 mmol/L (ref 135–145)
Total Bilirubin: 1.1 mg/dL (ref 0.3–1.2)
Total Protein: 8.6 g/dL — ABNORMAL HIGH (ref 6.5–8.1)

## 2020-08-08 LAB — BRAIN NATRIURETIC PEPTIDE: B Natriuretic Peptide: 71.5 pg/mL (ref 0.0–100.0)

## 2020-08-08 LAB — RESP PANEL BY RT-PCR (FLU A&B, COVID) ARPGX2
Influenza A by PCR: NEGATIVE
Influenza B by PCR: NEGATIVE
SARS Coronavirus 2 by RT PCR: NEGATIVE

## 2020-08-08 LAB — TROPONIN I (HIGH SENSITIVITY)
Troponin I (High Sensitivity): 9 ng/L (ref <18)
Troponin I (High Sensitivity): 10 ng/L (ref <18)

## 2020-08-08 LAB — LACTIC ACID, PLASMA: Lactic Acid, Venous: 2.1 mmol/L (ref 0.5–1.9)

## 2020-08-08 MED ORDER — IPRATROPIUM-ALBUTEROL 0.5-2.5 (3) MG/3ML IN SOLN
3.0000 mL | Freq: Once | RESPIRATORY_TRACT | Status: DC
  Filled 2020-08-08: qty 3

## 2020-08-08 MED ORDER — PREDNISONE 10 MG PO TABS: 20.0000 mg | ORAL_TABLET | Freq: Two times a day (BID) | ORAL | 0 refills | Status: AC

## 2020-08-08 MED ORDER — ALBUTEROL SULFATE HFA 108 (90 BASE) MCG/ACT IN AERS
2.0000 | INHALATION_SPRAY | Freq: Once | RESPIRATORY_TRACT | Status: AC
  Administered 2020-08-08: 10:00:00 2 via RESPIRATORY_TRACT
  Filled 2020-08-08: qty 6.7

## 2020-08-08 MED ORDER — AZITHROMYCIN 250 MG PO TABS: ORAL_TABLET | ORAL | 0 refills | Status: DC

## 2020-08-08 MED ORDER — IPRATROPIUM-ALBUTEROL 0.5-2.5 (3) MG/3ML IN SOLN
3.0000 mL | Freq: Once | RESPIRATORY_TRACT | Status: AC
  Administered 2020-08-08: 14:00:00 3 mL via RESPIRATORY_TRACT
  Filled 2020-08-08: qty 3

## 2020-08-08 MED ORDER — IOHEXOL 350 MG/ML SOLN
100.0000 mL | Freq: Once | INTRAVENOUS | Status: AC | PRN
  Administered 2020-08-08: 12:00:00 49 mL via INTRAVENOUS

## 2020-08-08 MED ORDER — SODIUM CHLORIDE 0.9 % IV SOLN
1.0000 g | Freq: Once | INTRAVENOUS | Status: AC
  Administered 2020-08-08: 14:00:00 1 g via INTRAVENOUS
  Filled 2020-08-08: qty 10

## 2020-08-08 MED ORDER — METHYLPREDNISOLONE SODIUM SUCC 125 MG IJ SOLR
60.0000 mg | Freq: Once | INTRAMUSCULAR | Status: AC
  Administered 2020-08-08: 10:00:00 60 mg via INTRAVENOUS
  Filled 2020-08-08: qty 2

## 2020-08-08 MED ORDER — SODIUM CHLORIDE 0.9 % IV BOLUS
500.0000 mL | Freq: Once | INTRAVENOUS | Status: AC
  Administered 2020-08-08: 10:00:00 500 mL via INTRAVENOUS

## 2020-08-08 MED ORDER — SODIUM CHLORIDE 0.9 % IV BOLUS
1000.0000 mL | Freq: Once | INTRAVENOUS | Status: AC
  Administered 2020-08-08: 11:00:00 1000 mL via INTRAVENOUS

## 2020-08-08 NOTE — ED Triage Notes (Signed)
Pt lives by himself called ems today for increasing sob  For last 2-3 days,  Pt is normally on 3 l [place] at home and runs 93  ON THAT AT BEST TODAy 88 %  Per ems. Pt has increased work of breathing with prolonged exhalation and cannot speak full sentences and audible wheezing

## 2020-08-08 NOTE — Discharge Instructions (Addendum)
Call your primary care doctor or specialist as discussed in the next 2-3 days.   Return immediately back to the ER if:  Your symptoms worsen within the next 12-24 hours. You develop new symptoms such as new fevers, persistent vomiting, new pain, shortness of breath, or new weakness or numbness, or if you have any other concerns.  

## 2020-08-08 NOTE — ED Provider Notes (Signed)
[facility] EMERGENCY DEPARTMENT Provider Note   CSN: [id number] Arrival date & time: 08/08/20  8938     History No chief complaint on file.   Christopher Pena is a 74 y.o. male.  Patient presents ER for cough congestion shortness of breath. Symptoms ongoing for 3 days. At baseline uses 3 L nasal cannula, however feeling this is not enough for the past couple of days. Denies headache or chest pain abdominal pain. Denies vomiting or diarrhea.        Past Medical History:  Diagnosis Date  . Anxiety   . Chronic low back pain    "degenerative spine dx'd ~ 7 yr ago" (06/08/2013)  . Claudication (HCC)   . Constipation due to pain medication   . COPD (chronic obstructive pulmonary disease) (HCC)   . Hypertension    "moderately high; RX didn't help" (06/08/2013) - no longer on meds (as of 09/19/16)  . Neuromuscular disorder (HCC)    degen spine  . Non-healing wound of lower extremity 06/09/2013  . PAD (peripheral artery disease), PTA/stent IDEV & chocolate baloon 06/08/13 04/13/2013   Severely reduced ABI's of 0.3 bilaterally   . Tobacco use 06/09/2013  . Unexplained weight loss     Patient Active Problem List   Diagnosis Date Noted  . DOE (dyspnea on exertion) 09/06/2019  . Chronic respiratory failure with hypoxia (HCC) 02/05/2019  . Acute on chronic respiratory failure with hypoxia (HCC) 12/17/2018  . Hyperlipidemia 02/12/2017  . Physical exam 08/14/2016  . Femoral-popliteal bypass graft occlusion, left (HCC) 05/21/2016  . Allergic rhinitis 05/13/2016  . Protein-calorie malnutrition, severe (HCC) 04/08/2016  . Pain in joint, lower leg 10/17/2014  . Encounter for post surgical wound check 12/09/2013  . Peripheral vascular disease, unspecified (HCC) 11/08/2013  . Leg edema, left, foot 06/22/2013  . Cellulitis of foot, left 06/09/2013  . Non-healing wound of lower extremity, lt great toe 06/09/2013  . Cigarette smoker 06/09/2013  . Claudication, lifestyle  limiting 05/11/2013  . Unexplained weight loss 04/28/2013  . PAD (peripheral artery disease), PTA/stent IDEV & chocolate baloon 06/08/13, Previous PTA/Stent to Rt SFA 05/10/13 04/13/2013  . COPD III 04/13/2013  . HTN (hypertension) 04/13/2013  . Anxiety 04/13/2013  . Low back pain 04/13/2013    Past Surgical History:  Procedure Laterality Date  . ABDOMINAL AORTAGRAM  05/10/2013   Procedure: ABDOMINAL Ronny Flurry;  Surgeon: Runell Gess, MD;  Location: [facility] CATH LAB;  Service: Cardiovascular;;  . ABI     04/09/13; abnormal  . ANGIOPLASTY Left 05/21/2016   Procedure: ballon ANGIOPLASTY of femoral to anterior to tibial bypass graft;  Surgeon: Nada Libman, MD;  Location: [facility] OR;  Service: Vascular;  Laterality: Left;  . APPENDECTOMY  05/2001  . FEMORAL ARTERY STENT Right 05/10/13   2 stents Rt SFA  . FEMORAL ARTERY STENT Left 06/08/2013  . FEMORAL-POPLITEAL BYPASS GRAFT Right 11/12/2013   Procedure: RIGHT  FEMORAL-BELOW KNEE POPLITEAL ARTERY BYPASS GRAFT USING NON- REVERSE RIGHT GREATER SAPHENOUS VEIN.;  Surgeon: Nada Libman, MD;  Location: [facility] OR;  Service: Vascular;  Laterality: Right;  . FEMORAL-TIBIAL BYPASS GRAFT Left 12/08/2015   Procedure:  LEFT FEMORAL-ANTERIOR TIBIAL ARTERY BYPASS GRAFT;  Surgeon: Nada Libman, MD;  Location: [facility] OR;  Service: Vascular;  Laterality: Left;  . FEMORAL-TIBIAL BYPASS GRAFT Left 09/20/2016   Procedure: REDO BYPASS GRAFT FEMORAL-ANTERIOR TIBIAL ARTERY;  Surgeon: Nada Libman, MD;  Location: [facility] OR;  Service: Vascular;  Laterality: Left;  . LOWER EXTREMITY  ANGIOGRAM Bilateral 05/10/2013   Procedure: LOWER EXTREMITY ANGIOGRAM;  Surgeon: Runell Gess, MD;  Location: [facility] CATH LAB;  Service: Cardiovascular;  Laterality: Bilateral;  . LOWER EXTREMITY ANGIOGRAM N/A 06/08/2013   Procedure: LOWER EXTREMITY ANGIOGRAM;  Surgeon: Runell Gess, MD;  Location: [facility] CATH LAB;  Service: Cardiovascular;  Laterality: N/A;  . LOWER EXTREMITY ANGIOGRAM N/A 10/28/2013    Procedure: LOWER EXTREMITY ANGIOGRAM;  Surgeon: Runell Gess, MD;  Location: [facility] CATH LAB;  Service: Cardiovascular;  Laterality: N/A;  . LOWER EXTREMITY ANGIOGRAM N/A 05/16/2014   Procedure: LOWER EXTREMITY ANGIOGRAM;  Surgeon: Runell Gess, MD;  Location: [facility] CATH LAB;  Service: Cardiovascular;  Laterality: N/A;  . PERCUTANEOUS STENT INTERVENTION Right 05/10/2013   Procedure: PERCUTANEOUS STENT INTERVENTION;  Surgeon: Runell Gess, MD;  Location: [facility] CATH LAB;  Service: Cardiovascular;  Laterality: Right;  rt sfa and popliteal stent x2  . PERIPHERAL VASCULAR CATHETERIZATION N/A 12/06/2015   Procedure: Abdominal Aortogram w/Lower Extremity;  Surgeon: Nada Libman, MD;  Location: [facility] INVASIVE CV LAB;  Service: Cardiovascular;  Laterality: N/A;  . PERIPHERAL VASCULAR CATHETERIZATION N/A 05/22/2016   Procedure: Abdominal Aortogram w/Lower Extremity;  Surgeon: Maeola Harman, MD;  Location: [facility] INVASIVE CV LAB;  Service: Cardiovascular;  Laterality: N/A;  . PERIPHERAL VASCULAR CATHETERIZATION N/A 09/17/2016   Procedure: Abdominal Aortogram w/Lower Extremity;  Surgeon: Nada Libman, MD;  Location: [facility] INVASIVE CV LAB;  Service: Cardiovascular;  Laterality: N/A;  . THROMBECTOMY FEMORAL ARTERY Left 05/21/2016   Procedure: THROMBECTOMY FEMORAL TO ANTERIOR TIBIAL BYPASS GRAFT;  Surgeon: Nada Libman, MD;  Location: [facility] OR;  Service: Vascular;  Laterality: Left;  Marland Kitchen VEIN HARVEST Left 12/08/2015   Procedure: USING NON REVERSE  LEFT GREATER SAPHENOUS VEIN;  Surgeon: Nada Libman, MD;  Location: [facility] OR;  Service: Vascular;  Laterality: Left;       Family History  Problem Relation Age of Onset  . Hyperlipidemia Mother   . Heart disease Mother   . Diabetes Mother   . Cancer Father        Liver    Social History   Tobacco Use  . Smoking status: Former Smoker    Packs/day: 0.25    Years: 50.00    Pack years: 12.50    Types: Cigarettes    Start date: 11/14/2015    Quit date:  11/13/2016    Years since quitting: 3.7  . Smokeless tobacco: Never Used  Vaping Use  . Vaping Use: Never used  Substance Use Topics  . Alcohol use: No    Alcohol/week: 0.0 standard drinks    Comment: 06/08/2013 "quit 02/2007; drank my share before that though"  . Drug use: No    Home Medications Prior to Admission medications   Medication Sig Start Date End Date Taking? Authorizing Provider  albuterol (VENTOLIN HFA) 108 (90 Base) MCG/ACT inhaler Inhale 2 puffs into the lungs every 6 (six) hours as needed for wheezing or shortness of breath. 01/20/20   Nyoka Cowden, MD  azithromycin (ZITHROMAX Z-PAK) 250 MG tablet Take 2 tablets by mouth once on day 1. On days 2 through 5 take 1 tablet by mouth daily. 08/08/20   Cheryll Cockayne, MD  busPIRone (BUSPAR) 7.5 MG tablet TAKE 1 TABLET BY MOUTH TWICE A DAY 07/17/20   Sheliah Hatch, MD  cilostazol (PLETAL) 100 MG tablet TAKE 1 TABLET BY MOUTH 2 TIMES A DAY BEFORE A MEAL 01/26/20   Maeola Harman, MD  clopidogrel (PLAVIX)  75 MG tablet TAKE 1 TABLET BY MOUTH EVERY DAY 07/17/20   Maeola Harman, MD  ipratropium-albuterol (DUONEB) 0.5-2.5 (3) MG/3ML SOLN TAKE THREE MLS VIA NEBULIZER EVERY 6 HOURS AS NEEDED 02/11/20   Nyoka Cowden, MD  oxyCODONE (ROXICODONE) 15 MG immediate release tablet Take 15 mg by mouth every 4 (four) hours. 12/03/18   [provider]  predniSONE (DELTASONE) 10 MG tablet Take 2 tablets (20 mg total) by mouth 2 (two) times daily with a meal for 5 days. 08/08/20 08/13/20  Cheryll Cockayne, MD  sertraline (ZOLOFT) 25 MG tablet TAKE 1 TABLET BY MOUTH EVERY DAY 01/26/20   Sheliah Hatch, MD    Allergies    Clindamycin/lincomycin  Review of Systems   Review of Systems  Constitutional: Negative for fever.  HENT: Negative for ear pain and sore throat.   Eyes: Negative for pain.  Respiratory: Positive for cough and shortness of breath.   Cardiovascular: Negative for chest pain.  Gastrointestinal:  Negative for abdominal pain.  Genitourinary: Negative for flank pain.  Musculoskeletal: Negative for back pain.  Skin: Negative for color change and rash.  Neurological: Negative for syncope.  All other systems reviewed and are negative.   Physical Exam Updated Vital Signs BP (!) 147/76   Pulse (!) 102   Temp 98.9 F (37.2 C) (Oral)   Resp (!) 24   SpO2 96%   Physical Exam Constitutional:      General: He is not in acute distress.    Appearance: He is well-developed.  HENT:     Head: Normocephalic.     Mouth/Throat:     Mouth: Mucous membranes are moist.  Cardiovascular:     Rate and Rhythm: Tachycardia present.  Pulmonary:     Breath sounds: Wheezing present.  Abdominal:     Palpations: Abdomen is soft.  Musculoskeletal:     Right lower leg: No edema.     Left lower leg: No edema.  Skin:    General: Skin is warm.     Capillary Refill: Capillary refill takes less than 2 seconds.  Neurological:     General: No focal deficit present.     Mental Status: He is alert.     ED Results / Procedures / Treatments   Labs (all labs ordered are listed, but only abnormal results are displayed) Labs Reviewed  CBC WITH DIFFERENTIAL/PLATELET - Abnormal; Notable for the following components:      Result Value   WBC 18.9 (*)    Neutro Abs 16.8 (*)    Monocytes Absolute 1.2 (*)    Abs Immature Granulocytes 0.12 (*)    All other components within normal limits  LACTIC ACID, PLASMA - Abnormal; Notable for the following components:   Lactic Acid, Venous 2.1 (*)    All other components within normal limits  COMPREHENSIVE METABOLIC PANEL - Abnormal; Notable for the following components:   CO2 21 (*)    Glucose, Bld 149 (*)    BUN 6 (*)    Total Protein 8.6 (*)    Anion gap 16 (*)    All other components within normal limits  RESP PANEL BY RT-PCR (FLU A&B, COVID) ARPGX2  CULTURE, BLOOD (ROUTINE X 2)  CULTURE, BLOOD (ROUTINE X 2)  BRAIN NATRIURETIC PEPTIDE  LACTIC ACID,  PLASMA  TROPONIN I (HIGH SENSITIVITY)  TROPONIN I (HIGH SENSITIVITY)    EKG None  Radiology CT Angio Chest PE W/Cm &/Or Wo Cm  Result Date: 08/08/2020 CLINICAL DATA:  Shortness of breath, wheezing EXAM: CT ANGIOGRAPHY CHEST WITH CONTRAST TECHNIQUE: Multidetector CT imaging of the chest was performed using the standard protocol during bolus administration of intravenous contrast. Multiplanar CT image reconstructions and MIPs were obtained to evaluate the vascular anatomy. CONTRAST:  71mL OMNIPAQUE IOHEXOL 350 MG/ML SOLN COMPARISON:  CT 12/17/2018 FINDINGS: Cardiovascular: Satisfactory opacification of the pulmonary arteries to the segmental level. No evidence of pulmonary embolism. Mid ascending thoracic aorta measures 4.1 cm. Three vessel arch. Atherosclerotic calcifications of the aorta and coronary arteries. Normal heart size. No pericardial effusion. Mediastinum/Nodes: Mildly prominent right hilar lymph node measuring 11 mm short axis (series 5, image 61), unchanged. Precarinal node measures 9 mm short axis (series 5, image 57), unchanged. No axillary or left hilar lymphadenopathy. Thyroid and esophagus within normal limits. Trachea within normal limits. Lungs/Pleura: Prominent right lower lobe bronchial wall thickening. Near complete opacification of the distal right lower lobe bronchioles (series 6, image 103), which may reflect mucous plugging. Tree-in-bud opacities within the periphery of the right lower lobe. Patchy airspace consolidation within the peripheral aspect of the right lower lobe (series 6, image 127). Rounded 10 mm right lower lobe noncalcified pulmonary nodule (series 6, image 112) is unchanged from 12/17/2018. Prominent biapical pleuroparenchymal scarring. Paraseptal emphysema with upper lobe predominance. No pleural effusion or pneumothorax. Upper Abdomen: Partially visualized infrarenal abdominal aortic aneurysm with mural thrombus measuring 3.6 cm in AP dimension (series 5, image  172). Musculoskeletal: No chest wall abnormality. No acute or significant osseous findings. Review of the MIP images confirms the above findings. IMPRESSION: 1. Negative for pulmonary embolism. 2. Prominent right lower lobe bronchial wall thickening with near complete opacification of the distal right lower lobe bronchioles, which may reflect mucous plugging. Scattered tree-in-bud opacity within the right lower lobe suggestive of bronchiolitis. 3. Patchy airspace consolidation within the peripheral aspect of the right lower lobe may reflect atelectasis versus pneumonia. 4. Mildly prominent right hilar and mediastinal lymph nodes, likely reactive. 5. Partially visualized infrarenal abdominal aortic aneurysm measuring 3.6 cm in AP dimension. Recommend follow-up ultrasound every 2 years. This recommendation follows ACR consensus guidelines: White Paper of the ACR Incidental Findings Committee II on Vascular Findings. J Am Coll Radiol 2013; 10:789-794. 6. Mid ascending thoracic aorta measures 4.1 cm. Recommend annual imaging followup by CTA or MRA. This recommendation follows 2010 ACCF/AHA/AATS/ACR/ASA/SCA/SCAI/SIR/STS/SVM Guidelines for the Diagnosis and Management of Patients with Thoracic Aortic Disease. Circulation. 2010; 121: Z610-R604. Aortic aneurysm NOS (ICD10-I71.9) Aortic Atherosclerosis (ICD10-I70.0) and Emphysema (ICD10-J43.9). Electronically Signed   By: Duanne Guess D.O.   On: 08/08/2020 12:16   DG Chest Portable 1 View  Result Date: 08/08/2020 CLINICAL DATA:  Shortness of breath, coughing, wheezing, history COPD, hypertension, former smoker EXAM: PORTABLE CHEST 1 VIEW COMPARISON:  Portable exam 0954 hours compared to 09/06/2019 FINDINGS: Normal heart size, mediastinal contours, and pulmonary vascularity. Atherosclerotic calcification aorta. Emphysematous and bronchitic changes consistent with COPD. Biapical scarring and mild bibasilar chronic interstitial lung disease changes, stable. No acute  infiltrate, pleural effusion or pneumothorax. Osseous structures unremarkable. IMPRESSION: COPD changes with biapical scarring and mild bibasilar chronic interstitial prominence. No acute abnormalities. Aortic Atherosclerosis (ICD10-I70.0). Electronically Signed   By: Ulyses Southward M.D.   On: 08/08/2020 10:00    Procedures Procedures (including critical care time)  Medications Ordered in ED Medications  ipratropium-albuterol (DUONEB) 0.5-2.5 (3) MG/3ML nebulizer solution 3 mL (has no administration in time range)  ipratropium-albuterol (DUONEB) 0.5-2.5 (3) MG/3ML nebulizer solution 3 mL (has no administration in time range)  cefTRIAXone (ROCEPHIN) 1 g in sodium chloride 0.9 % 100 mL IVPB (has no administration in time range)  sodium chloride 0.9 % bolus 500 mL (0 mLs Intravenous Stopped 08/08/20 1103)  methylPREDNISolone sodium succinate (SOLU-MEDROL) 125 mg/2 mL injection 60 mg (60 mg Intravenous Given 08/08/20 1001)  albuterol (VENTOLIN HFA) 108 (90 Base) MCG/ACT inhaler 2 puff (2 puffs Inhalation Given 08/08/20 1001)  sodium chloride 0.9 % bolus 1,000 mL (1,000 mLs Intravenous New Bag/Given 08/08/20 1107)  iohexol (OMNIPAQUE) 350 MG/ML injection 100 mL (49 mLs Intravenous Contrast Given 08/08/20 1157)    ED Course  I have reviewed the triage vital signs and the nursing notes.  Pertinent labs & imaging results that were available during my care of the patient were reviewed by me and considered in my medical decision making (see chart for details).    MDM Rules/Calculators/A&P                          Labs show white count of 18 chemistry otherwise unremarkable lactic acid 2.1. This is a troponin is in the proper negative proBNP normal as well.  Patient presented very tachycardic but after treatments and IV fluids heart rate appears improved. Prior records show history of tachycardia within the recent several months.  CT angio pursued, no evidence of pulmonary embolism, questionable  signs of pneumonia per radiology. Patient given a gram of Rocephin here in the ER. Discharged home with prescription of azithromycin. Advised follow-up with his doctors in 2 or 3 days. Advised me to return for worsening symptoms fevers difficulty breathing or any additional concerns.  Final Clinical Impression(s) / ED Diagnoses Final diagnoses:  Shortness of breath    Rx / DC Orders ED Discharge Orders         Ordered    azithromycin (ZITHROMAX Z-PAK) 250 MG tablet        08/08/20 1349    predniSONE (DELTASONE) 10 MG tablet  2 times daily with meals        08/08/20 1349           Cheryll CockayneHong, Lorely Bubb S, MD 08/08/20 1349

## 2020-08-08 NOTE — ED Notes (Signed)
Patient stated, daughter on the way w/ clothes and portable o2 for ride home.

## 2020-08-08 NOTE — ED Notes (Signed)
Patient is resting comfortably. Water cup provided

## 2020-08-09 DIAGNOSIS — M5431 Sciatica, right side: Secondary | ICD-10-CM | POA: Diagnosis not present

## 2020-08-09 DIAGNOSIS — G894 Chronic pain syndrome: Secondary | ICD-10-CM | POA: Diagnosis not present

## 2020-08-09 DIAGNOSIS — M79605 Pain in left leg: Secondary | ICD-10-CM | POA: Diagnosis not present

## 2020-08-09 DIAGNOSIS — Z79891 Long term (current) use of opiate analgesic: Secondary | ICD-10-CM | POA: Diagnosis not present

## 2020-08-09 DIAGNOSIS — G8929 Other chronic pain: Secondary | ICD-10-CM | POA: Diagnosis not present

## 2020-08-09 DIAGNOSIS — M545 Low back pain, unspecified: Secondary | ICD-10-CM | POA: Diagnosis not present

## 2020-08-09 DIAGNOSIS — M5432 Sciatica, left side: Secondary | ICD-10-CM | POA: Diagnosis not present

## 2020-08-09 DIAGNOSIS — M79604 Pain in right leg: Secondary | ICD-10-CM | POA: Diagnosis not present

## 2020-08-13 LAB — CULTURE, BLOOD (ROUTINE X 2)
Culture: NO GROWTH
Culture: NO GROWTH
Special Requests: ADEQUATE

## 2020-08-26 DIAGNOSIS — J441 Chronic obstructive pulmonary disease with (acute) exacerbation: Secondary | ICD-10-CM | POA: Diagnosis not present

## 2020-08-26 DIAGNOSIS — J9621 Acute and chronic respiratory failure with hypoxia: Secondary | ICD-10-CM | POA: Diagnosis not present

## 2020-09-06 DIAGNOSIS — M79604 Pain in right leg: Secondary | ICD-10-CM | POA: Diagnosis not present

## 2020-09-06 DIAGNOSIS — G894 Chronic pain syndrome: Secondary | ICD-10-CM | POA: Diagnosis not present

## 2020-09-06 DIAGNOSIS — G8929 Other chronic pain: Secondary | ICD-10-CM | POA: Diagnosis not present

## 2020-09-06 DIAGNOSIS — Z79891 Long term (current) use of opiate analgesic: Secondary | ICD-10-CM | POA: Diagnosis not present

## 2020-09-06 DIAGNOSIS — M5432 Sciatica, left side: Secondary | ICD-10-CM | POA: Diagnosis not present

## 2020-09-06 DIAGNOSIS — M79605 Pain in left leg: Secondary | ICD-10-CM | POA: Diagnosis not present

## 2020-09-06 DIAGNOSIS — M5431 Sciatica, right side: Secondary | ICD-10-CM | POA: Diagnosis not present

## 2020-09-06 DIAGNOSIS — M545 Low back pain, unspecified: Secondary | ICD-10-CM | POA: Diagnosis not present

## 2020-09-26 DIAGNOSIS — J441 Chronic obstructive pulmonary disease with (acute) exacerbation: Secondary | ICD-10-CM | POA: Diagnosis not present

## 2020-09-26 DIAGNOSIS — J9621 Acute and chronic respiratory failure with hypoxia: Secondary | ICD-10-CM | POA: Diagnosis not present

## 2020-10-04 DIAGNOSIS — G8929 Other chronic pain: Secondary | ICD-10-CM | POA: Diagnosis not present

## 2020-10-04 DIAGNOSIS — M79604 Pain in right leg: Secondary | ICD-10-CM | POA: Diagnosis not present

## 2020-10-04 DIAGNOSIS — M5431 Sciatica, right side: Secondary | ICD-10-CM | POA: Diagnosis not present

## 2020-10-04 DIAGNOSIS — G894 Chronic pain syndrome: Secondary | ICD-10-CM | POA: Diagnosis not present

## 2020-10-04 DIAGNOSIS — M545 Low back pain, unspecified: Secondary | ICD-10-CM | POA: Diagnosis not present

## 2020-10-04 DIAGNOSIS — M79605 Pain in left leg: Secondary | ICD-10-CM | POA: Diagnosis not present

## 2020-10-04 DIAGNOSIS — Z79891 Long term (current) use of opiate analgesic: Secondary | ICD-10-CM | POA: Diagnosis not present

## 2020-10-04 DIAGNOSIS — M5432 Sciatica, left side: Secondary | ICD-10-CM | POA: Diagnosis not present

## 2020-10-11 ENCOUNTER — Telehealth: Payer: Self-pay

## 2020-10-12 ENCOUNTER — Other Ambulatory Visit: Payer: Self-pay

## 2020-10-12 ENCOUNTER — Telehealth: Payer: Self-pay

## 2020-10-12 DIAGNOSIS — F419 Anxiety disorder, unspecified: Secondary | ICD-10-CM

## 2020-10-12 MED ORDER — SERTRALINE HCL 25 MG PO TABS: 25.0000 mg | ORAL_TABLET | Freq: Every day | ORAL | 2 refills | Status: DC

## 2020-10-12 NOTE — Telephone Encounter (Signed)
Pt's pharmacy ([facility]) called with refill request for Pletal. Pt was due for f/u in 2020 with studies but canceled the appt. He feels there will be minimal, if any change in his studies and his condition has remained unchanged. I have forwarded this request to MD. Pt is aware we may not be able to refill this without an office visit. He stated he is okay to d/c the medication if the MD feels that is appropriate. Will wait for MD to respond to proceed.

## 2020-10-12 NOTE — Chronic Care Management (AMB) (Signed)
Chronic Care Management Pharmacy Assistant   Name: Christopher Pena  MRN: [id number] DOB: 07-11-1946  Reason for Encounter: Medication Review  PCP : Sheliah Hatch, MD  Allergies:   Allergies  Allergen Reactions  . Clindamycin/Lincomycin Diarrhea    Medications: Outpatient Encounter Medications as of 10/11/2020  Medication Sig  . albuterol (VENTOLIN HFA) 108 (90 Base) MCG/ACT inhaler Inhale 2 puffs into the lungs every 6 (six) hours as needed for wheezing or shortness of breath.  Marland Kitchen azithromycin (ZITHROMAX Z-PAK) 250 MG tablet Take 2 tablets by mouth once on day 1. On days 2 through 5 take 1 tablet by mouth daily.  . busPIRone (BUSPAR) 7.5 MG tablet TAKE 1 TABLET BY MOUTH TWICE A DAY  . cilostazol (PLETAL) 100 MG tablet TAKE 1 TABLET BY MOUTH 2 TIMES A DAY BEFORE A MEAL  . clopidogrel (PLAVIX) 75 MG tablet TAKE 1 TABLET BY MOUTH EVERY DAY  . ipratropium-albuterol (DUONEB) 0.5-2.5 (3) MG/3ML SOLN TAKE THREE MLS VIA NEBULIZER EVERY 6 HOURS AS NEEDED  . oxyCODONE (ROXICODONE) 15 MG immediate release tablet Take 15 mg by mouth every 4 (four) hours.  . sertraline (ZOLOFT) 25 MG tablet TAKE 1 TABLET BY MOUTH EVERY DAY   No facility-administered encounter medications on file as of 10/11/2020.    Current Diagnosis: Patient Active Problem List   Diagnosis Date Noted  . DOE (dyspnea on exertion) 09/06/2019  . Chronic respiratory failure with hypoxia (HCC) 02/05/2019  . Acute on chronic respiratory failure with hypoxia (HCC) 12/17/2018  . Hyperlipidemia 02/12/2017  . Physical exam 08/14/2016  . Femoral-popliteal bypass graft occlusion, left (HCC) 05/21/2016  . Allergic rhinitis 05/13/2016  . Protein-calorie malnutrition, severe (HCC) 04/08/2016  . Pain in joint, lower leg 10/17/2014  . Encounter for post surgical wound check 12/09/2013  . Peripheral vascular disease, unspecified (HCC) 11/08/2013  . Leg edema, left, foot 06/22/2013  . Cellulitis of foot, left 06/09/2013  .  Non-healing wound of lower extremity, lt great toe 06/09/2013  . Cigarette smoker 06/09/2013  . Claudication, lifestyle limiting 05/11/2013  . Unexplained weight loss 04/28/2013  . PAD (peripheral artery disease), PTA/stent IDEV & chocolate baloon 06/08/13, Previous PTA/Stent to Rt SFA 05/10/13 04/13/2013  . COPD III 04/13/2013  . HTN (hypertension) 04/13/2013  . Anxiety 04/13/2013  . Low back pain 04/13/2013    Reviewed chart for medication changes ahead of medication coordination call.  No OVs, Consults, or hospital visits since last care coordination call/Pharmacist visit. (If appropriate, list visit date, provider name)  No medication changes indicated OR if recent visit, treatment plan here.  BP Readings from Last 3 Encounters:  08/08/20 124/88  09/06/19 (!) 142/84  04/05/19 124/80    Lab Results  Component Value Date   HGBA1C 5.7 (H) 12/17/2018     Patient obtains medications through Vials  90 Days   Patient is due for next adherence delivery on: 10/16/2020. Called patient and reviewed medications and coordinated delivery.  This delivery to include:  Buspirone 7.5 mg tablet twice daily Cilostazol 100 mg tablet two times daily before a meal Clopidogrel 75 mg tablet daily Prednisone 10 mg tablet as directed Sertraline 25 mg tablet daily  Patient needs refills for:  Sertraline 25 mg tablet - CPP to request Cilostazol 100 mg tablet - requested from Dr. Randie Heinz  Confirmed delivery date of 10/16/2020, advised patient that pharmacy will contact them the morning of delivery.  April D Calhoun, [facility] Clinical Pharmacist Assistant [phone]   Follow-Up:  Coordination of  Adult nurse and Pharmacist Review

## 2020-10-12 NOTE — Telephone Encounter (Signed)
You're welcome!

## 2020-10-12 NOTE — Telephone Encounter (Signed)
Sent Rx to [facility] pharmacy for patient.

## 2020-10-13 ENCOUNTER — Other Ambulatory Visit: Payer: Self-pay | Admitting: Vascular Surgery

## 2020-10-13 DIAGNOSIS — I779 Disorder of arteries and arterioles, unspecified: Secondary | ICD-10-CM

## 2020-10-16 ENCOUNTER — Other Ambulatory Visit: Payer: Self-pay

## 2020-10-16 DIAGNOSIS — I779 Disorder of arteries and arterioles, unspecified: Secondary | ICD-10-CM

## 2020-10-16 MED ORDER — CILOSTAZOL 100 MG PO TABS: ORAL_TABLET | ORAL | 1 refills | Status: DC

## 2020-10-17 ENCOUNTER — Telehealth: Payer: Self-pay

## 2020-10-17 NOTE — Telephone Encounter (Signed)
Called pt to let him know his Pletal rx has been refilled, per MD. Left voice mail.

## 2020-10-20 ENCOUNTER — Telehealth: Payer: Self-pay

## 2020-10-20 NOTE — Chronic Care Management (AMB) (Signed)
    Chronic Care Management Pharmacy Assistant   Name: Christopher Pena  MRN: [id number] DOB: [birth year]  Reason for Encounter: Schedule Follow-Up With Clinical Pharmacist  Several unsuccessful attempts to reach out to the patient to schedule appointment with clinical pharmacist Dahlia Byes, CPP. I left a message for the patient to return my call.  April D Calhoun, [facility] Clinical Pharmacist Assistant [phone]   Follow-Up:  Pharmacist Review

## 2020-10-24 DIAGNOSIS — J441 Chronic obstructive pulmonary disease with (acute) exacerbation: Secondary | ICD-10-CM | POA: Diagnosis not present

## 2020-10-24 DIAGNOSIS — J9621 Acute and chronic respiratory failure with hypoxia: Secondary | ICD-10-CM | POA: Diagnosis not present

## 2020-11-01 DIAGNOSIS — Z79891 Long term (current) use of opiate analgesic: Secondary | ICD-10-CM | POA: Diagnosis not present

## 2020-11-01 DIAGNOSIS — M5431 Sciatica, right side: Secondary | ICD-10-CM | POA: Diagnosis not present

## 2020-11-01 DIAGNOSIS — G894 Chronic pain syndrome: Secondary | ICD-10-CM | POA: Diagnosis not present

## 2020-11-01 DIAGNOSIS — G890 Central pain syndrome: Secondary | ICD-10-CM | POA: Diagnosis not present

## 2020-11-01 DIAGNOSIS — M545 Low back pain, unspecified: Secondary | ICD-10-CM | POA: Diagnosis not present

## 2020-11-01 DIAGNOSIS — M79604 Pain in right leg: Secondary | ICD-10-CM | POA: Diagnosis not present

## 2020-11-01 DIAGNOSIS — G8929 Other chronic pain: Secondary | ICD-10-CM | POA: Diagnosis not present

## 2020-11-01 DIAGNOSIS — M79605 Pain in left leg: Secondary | ICD-10-CM | POA: Diagnosis not present

## 2020-11-01 DIAGNOSIS — M5432 Sciatica, left side: Secondary | ICD-10-CM | POA: Diagnosis not present

## 2020-11-07 ENCOUNTER — Telehealth: Payer: Self-pay

## 2020-11-07 NOTE — Progress Notes (Signed)
    Chronic Care Management Pharmacy Assistant   Name: Christopher Pena  MRN: [id number] DOB: [birth year]   Reason for Encounter: Medication Coordination    Medications: Outpatient Encounter Medications as of 11/07/2020  Medication Sig  . albuterol (VENTOLIN HFA) 108 (90 Base) MCG/ACT inhaler Inhale 2 puffs into the lungs every 6 (six) hours as needed for wheezing or shortness of breath.  Marland Kitchen azithromycin (ZITHROMAX Z-PAK) 250 MG tablet Take 2 tablets by mouth once on day 1. On days 2 through 5 take 1 tablet by mouth daily.  . busPIRone (BUSPAR) 7.5 MG tablet TAKE 1 TABLET BY MOUTH TWICE A DAY  . cilostazol (PLETAL) 100 MG tablet TAKE ONE TABLET BY MOUTH TWICE DAILY BEFORE MEALS  . clopidogrel (PLAVIX) 75 MG tablet TAKE 1 TABLET BY MOUTH EVERY DAY  . ipratropium-albuterol (DUONEB) 0.5-2.5 (3) MG/3ML SOLN TAKE THREE MLS VIA NEBULIZER EVERY 6 HOURS AS NEEDED  . oxyCODONE (ROXICODONE) 15 MG immediate release tablet Take 15 mg by mouth every 4 (four) hours.  . sertraline (ZOLOFT) 25 MG tablet Take 1 tablet (25 mg total) by mouth daily.   No facility-administered encounter medications on file as of 11/07/2020.    Reviewed chart for medication changes ahead of medication coordination call.  No OVs, Consults, or hospital visits since last care coordination call/Pharmacist visit. (If appropriate, list visit date, provider name)  No medication changes indicated OR if recent visit, treatment plan here.  BP Readings from Last 3 Encounters:  08/08/20 124/88  09/06/19 (!) 142/84  04/05/19 124/80    Lab Results  Component Value Date   HGBA1C 5.7 (H) 12/17/2018     Patient obtains medications through Vials  90 Days   Last adherence delivery included: Cilostazol 100 mg tab Take one tab twice daily before meals Sertraline 25 mg Tab Take one tab by mouth daily Clopidogrel 75 mg tablet Take one tablet by mouth once daily Buspirone 7.5mg  Tab Take one tab by mouth twice daily   Called patient  and reviewed medications and coordinated delivery. Patient is not needing any medications at this time.     Aloha Gell ,[facility] Clinical Pharmacist Assistant [phone]

## 2020-11-24 DIAGNOSIS — J441 Chronic obstructive pulmonary disease with (acute) exacerbation: Secondary | ICD-10-CM | POA: Diagnosis not present

## 2020-11-24 DIAGNOSIS — J9621 Acute and chronic respiratory failure with hypoxia: Secondary | ICD-10-CM | POA: Diagnosis not present

## 2020-11-29 DIAGNOSIS — M79604 Pain in right leg: Secondary | ICD-10-CM | POA: Diagnosis not present

## 2020-11-29 DIAGNOSIS — M79605 Pain in left leg: Secondary | ICD-10-CM | POA: Diagnosis not present

## 2020-11-29 DIAGNOSIS — G8929 Other chronic pain: Secondary | ICD-10-CM | POA: Diagnosis not present

## 2020-11-29 DIAGNOSIS — M5432 Sciatica, left side: Secondary | ICD-10-CM | POA: Diagnosis not present

## 2020-11-29 DIAGNOSIS — M5431 Sciatica, right side: Secondary | ICD-10-CM | POA: Diagnosis not present

## 2020-11-29 DIAGNOSIS — G890 Central pain syndrome: Secondary | ICD-10-CM | POA: Diagnosis not present

## 2020-11-29 DIAGNOSIS — Z79891 Long term (current) use of opiate analgesic: Secondary | ICD-10-CM | POA: Diagnosis not present

## 2020-11-29 DIAGNOSIS — G894 Chronic pain syndrome: Secondary | ICD-10-CM | POA: Diagnosis not present

## 2020-11-29 DIAGNOSIS — M545 Low back pain, unspecified: Secondary | ICD-10-CM | POA: Diagnosis not present

## 2020-12-06 ENCOUNTER — Inpatient Hospital Stay (HOSPITAL_COMMUNITY)
Admission: EM | Admit: 2020-12-06 | Discharge: 2020-12-10 | DRG: 253 | Disposition: A | Discharge status: Home / Self Care | Payer: Medicare Other | Attending: Vascular Surgery | Admitting: Vascular Surgery

## 2020-12-06 ENCOUNTER — Emergency Department (HOSPITAL_COMMUNITY): Payer: Medicare Other

## 2020-12-06 ENCOUNTER — Encounter (HOSPITAL_COMMUNITY): Payer: Self-pay | Admitting: Emergency Medicine

## 2020-12-06 DIAGNOSIS — Z833 Family history of diabetes mellitus: Secondary | ICD-10-CM | POA: Diagnosis not present

## 2020-12-06 DIAGNOSIS — J449 Chronic obstructive pulmonary disease, unspecified: Secondary | ICD-10-CM | POA: Diagnosis present

## 2020-12-06 DIAGNOSIS — K5750 Diverticulosis of both small and large intestine without perforation or abscess without bleeding: Secondary | ICD-10-CM | POA: Diagnosis not present

## 2020-12-06 DIAGNOSIS — J9611 Chronic respiratory failure with hypoxia: Secondary | ICD-10-CM | POA: Diagnosis present

## 2020-12-06 DIAGNOSIS — Z7902 Long term (current) use of antithrombotics/antiplatelets: Secondary | ICD-10-CM | POA: Diagnosis not present

## 2020-12-06 DIAGNOSIS — I10 Essential (primary) hypertension: Secondary | ICD-10-CM | POA: Diagnosis not present

## 2020-12-06 DIAGNOSIS — Z83438 Family history of other disorder of lipoprotein metabolism and other lipidemia: Secondary | ICD-10-CM

## 2020-12-06 DIAGNOSIS — Z20822 Contact with and (suspected) exposure to covid-19: Secondary | ICD-10-CM | POA: Diagnosis not present

## 2020-12-06 DIAGNOSIS — R0902 Hypoxemia: Secondary | ICD-10-CM | POA: Diagnosis not present

## 2020-12-06 DIAGNOSIS — Z87891 Personal history of nicotine dependence: Secondary | ICD-10-CM | POA: Diagnosis not present

## 2020-12-06 DIAGNOSIS — Z800 Family history of malignant neoplasm of digestive organs: Secondary | ICD-10-CM

## 2020-12-06 DIAGNOSIS — Z79899 Other long term (current) drug therapy: Secondary | ICD-10-CM

## 2020-12-06 DIAGNOSIS — T82818A Embolism of vascular prosthetic devices, implants and grafts, initial encounter: Secondary | ICD-10-CM | POA: Diagnosis not present

## 2020-12-06 DIAGNOSIS — M545 Low back pain, unspecified: Secondary | ICD-10-CM | POA: Diagnosis not present

## 2020-12-06 DIAGNOSIS — R6889 Other general symptoms and signs: Secondary | ICD-10-CM | POA: Diagnosis not present

## 2020-12-06 DIAGNOSIS — Z743 Need for continuous supervision: Secondary | ICD-10-CM | POA: Diagnosis not present

## 2020-12-06 DIAGNOSIS — G8929 Other chronic pain: Secondary | ICD-10-CM | POA: Diagnosis not present

## 2020-12-06 DIAGNOSIS — I70209 Unspecified atherosclerosis of native arteries of extremities, unspecified extremity: Secondary | ICD-10-CM | POA: Diagnosis not present

## 2020-12-06 DIAGNOSIS — Y713 Surgical instruments, materials and cardiovascular devices (including sutures) associated with adverse incidents: Secondary | ICD-10-CM | POA: Diagnosis present

## 2020-12-06 DIAGNOSIS — Z9981 Dependence on supplemental oxygen: Secondary | ICD-10-CM | POA: Diagnosis not present

## 2020-12-06 DIAGNOSIS — Z8249 Family history of ischemic heart disease and other diseases of the circulatory system: Secondary | ICD-10-CM | POA: Diagnosis not present

## 2020-12-06 DIAGNOSIS — I70222 Atherosclerosis of native arteries of extremities with rest pain, left leg: Secondary | ICD-10-CM | POA: Diagnosis not present

## 2020-12-06 DIAGNOSIS — I743 Embolism and thrombosis of arteries of the lower extremities: Secondary | ICD-10-CM | POA: Diagnosis not present

## 2020-12-06 DIAGNOSIS — I499 Cardiac arrhythmia, unspecified: Secondary | ICD-10-CM | POA: Diagnosis not present

## 2020-12-06 DIAGNOSIS — E785 Hyperlipidemia, unspecified: Secondary | ICD-10-CM | POA: Diagnosis not present

## 2020-12-06 DIAGNOSIS — I714 Abdominal aortic aneurysm, without rupture: Secondary | ICD-10-CM | POA: Diagnosis not present

## 2020-12-06 DIAGNOSIS — I723 Aneurysm of iliac artery: Secondary | ICD-10-CM | POA: Diagnosis not present

## 2020-12-06 DIAGNOSIS — Z888 Allergy status to other drugs, medicaments and biological substances status: Secondary | ICD-10-CM | POA: Diagnosis not present

## 2020-12-06 DIAGNOSIS — I708 Atherosclerosis of other arteries: Secondary | ICD-10-CM | POA: Diagnosis not present

## 2020-12-06 DIAGNOSIS — M79605 Pain in left leg: Secondary | ICD-10-CM | POA: Diagnosis not present

## 2020-12-06 DIAGNOSIS — M79662 Pain in left lower leg: Secondary | ICD-10-CM | POA: Diagnosis not present

## 2020-12-06 DIAGNOSIS — I70229 Atherosclerosis of native arteries of extremities with rest pain, unspecified extremity: Secondary | ICD-10-CM

## 2020-12-06 LAB — BASIC METABOLIC PANEL
Anion gap: 12 (ref 5–15)
BUN: 11 mg/dL (ref 8–23)
CO2: 25 mmol/L (ref 22–32)
Calcium: 9.3 mg/dL (ref 8.9–10.3)
Chloride: 99 mmol/L (ref 98–111)
Creatinine, Ser: 1.17 mg/dL (ref 0.61–1.24)
GFR, Estimated: >60 mL/min (ref >60)
Glucose, Bld: 257 mg/dL — ABNORMAL HIGH (ref 70–99)
Potassium: 3.5 mmol/L (ref 3.5–5.1)
Sodium: 136 mmol/L (ref 135–145)

## 2020-12-06 LAB — CBC WITH DIFFERENTIAL/PLATELET
Abs Immature Granulocytes: 0.08 10*3/uL — ABNORMAL HIGH (ref 0.00–0.07)
Basophils Absolute: 0.1 10*3/uL (ref 0.0–0.1)
Basophils Relative: 1 %
Eosinophils Absolute: 0.2 10*3/uL (ref 0.0–0.5)
Eosinophils Relative: 2 %
HCT: 46 % (ref 39.0–52.0)
Hemoglobin: 15.1 g/dL (ref 13.0–17.0)
Immature Granulocytes: 1 %
Lymphocytes Relative: 7 %
Lymphs Abs: 1 10*3/uL (ref 0.7–4.0)
MCH: 30.9 pg (ref 26.0–34.0)
MCHC: 32.8 g/dL (ref 30.0–36.0)
MCV: 94.1 fL (ref 80.0–100.0)
Monocytes Absolute: 0.7 10*3/uL (ref 0.1–1.0)
Monocytes Relative: 5 %
Neutro Abs: 11.7 10*3/uL — ABNORMAL HIGH (ref 1.7–7.7)
Neutrophils Relative %: 84 %
Platelets: 379 10*3/uL (ref 150–400)
RBC: 4.89 MIL/uL (ref 4.22–5.81)
RDW: 14.6 % (ref 11.5–15.5)
WBC: 13.8 10*3/uL — ABNORMAL HIGH (ref 4.0–10.5)
nRBC: 0 % (ref 0.0–0.2)

## 2020-12-06 LAB — I-STAT CHEM 8, ED
BUN: 13 mg/dL (ref 8–23)
Calcium, Ion: 1.2 mmol/L (ref 1.15–1.40)
Chloride: 99 mmol/L (ref 98–111)
Creatinine, Ser: 1 mg/dL (ref 0.61–1.24)
Glucose, Bld: 264 mg/dL — ABNORMAL HIGH (ref 70–99)
HCT: 48 % (ref 39.0–52.0)
Hemoglobin: 16.3 g/dL (ref 13.0–17.0)
Potassium: 3.6 mmol/L (ref 3.5–5.1)
Sodium: 139 mmol/L (ref 135–145)
TCO2: 27 mmol/L (ref 22–32)

## 2020-12-06 LAB — RESP PANEL BY RT-PCR (FLU A&B, COVID) ARPGX2
Influenza A by PCR: NEGATIVE
Influenza B by PCR: NEGATIVE
SARS Coronavirus 2 by RT PCR: NEGATIVE

## 2020-12-06 LAB — PROTIME-INR
INR: 1 (ref 0.8–1.2)
Prothrombin Time: 13.3 seconds (ref 11.4–15.2)

## 2020-12-06 LAB — APTT: aPTT: 32 seconds (ref 24–36)

## 2020-12-06 MED ORDER — HEPARIN BOLUS VIA INFUSION
4500.0000 [IU] | Freq: Once | INTRAVENOUS | Status: AC
  Administered 2020-12-06: 4500 [IU] via INTRAVENOUS
  Filled 2020-12-06: qty 4500

## 2020-12-06 MED ORDER — HEPARIN (PORCINE) 25000 UT/250ML-% IV SOLN
1150.0000 [IU]/h | INTRAVENOUS | Status: DC
  Administered 2020-12-06: 1250 [IU]/h via INTRAVENOUS
  Administered 2020-12-07: 1150 [IU]/h via INTRAVENOUS
  Filled 2020-12-06 (×2): qty 250

## 2020-12-06 MED ORDER — SODIUM CHLORIDE 0.9 % IV BOLUS
500.0000 mL | Freq: Once | INTRAVENOUS | Status: AC
  Administered 2020-12-06: 500 mL via INTRAVENOUS

## 2020-12-06 MED ORDER — OXYCODONE-ACETAMINOPHEN 5-325 MG PO TABS: 1.0000 | ORAL_TABLET | Freq: Once | ORAL | Status: DC

## 2020-12-06 MED ORDER — MORPHINE SULFATE (PF) 4 MG/ML IV SOLN
4.0000 mg | Freq: Once | INTRAVENOUS | Status: AC
  Administered 2020-12-06: 4 mg via INTRAVENOUS
  Filled 2020-12-06: qty 1

## 2020-12-06 MED ORDER — IOHEXOL 350 MG/ML SOLN
100.0000 mL | Freq: Once | INTRAVENOUS | Status: AC | PRN
  Administered 2020-12-06: 100 mL via INTRAVENOUS

## 2020-12-06 MED ORDER — HYDROMORPHONE HCL 1 MG/ML IJ SOLN
1.0000 mg | Freq: Once | INTRAMUSCULAR | Status: AC
  Administered 2020-12-06: 1 mg via INTRAVENOUS
  Filled 2020-12-06: qty 1

## 2020-12-06 NOTE — ED Provider Notes (Signed)
[facility] EMERGENCY DEPARTMENT Provider Note   CSN: [id number] Arrival date & time: 12/06/20  1851     History No chief complaint on file.   Christopher Pena is a 75 y.o. male.  He has a history of COPD and significant vascular disease and has had bypasses on his left leg.  Follows with Dr. Myra Gianotti and last surgery was in 2018.  Complaining of acute pain in his left leg, started in his thigh and now it is just below the knee.  Like a tooth ache. Associated with numbness. Still taking Plavix.  Denies any trauma.  Quit smoking 4 years ago  The history is provided by the patient.  Leg Pain Location:  Leg Time since incident:  4 hours Injury: no   Leg location:  L lower leg Pain details:    Quality:  Aching   Severity:  Severe   Onset quality:  Sudden   Timing:  Constant   Progression:  Unchanged Chronicity:  New Relieved by:  Nothing Worsened by:  Bearing weight Ineffective treatments:  None tried Associated symptoms: back pain   Associated symptoms: no fever and no neck pain        Past Medical History:  Diagnosis Date  . Anxiety   . Chronic low back pain    "degenerative spine dx'd ~ 7 yr ago" (06/08/2013)  . Claudication (HCC)   . Constipation due to pain medication   . COPD (chronic obstructive pulmonary disease) (HCC)   . Hypertension    "moderately high; RX didn't help" (06/08/2013) - no longer on meds (as of 09/19/16)  . Neuromuscular disorder (HCC)    degen spine  . Non-healing wound of lower extremity 06/09/2013  . PAD (peripheral artery disease), PTA/stent IDEV & chocolate baloon 06/08/13 04/13/2013   Severely reduced ABI's of 0.3 bilaterally   . Tobacco use 06/09/2013  . Unexplained weight loss     Patient Active Problem List   Diagnosis Date Noted  . DOE (dyspnea on exertion) 09/06/2019  . Chronic respiratory failure with hypoxia (HCC) 02/05/2019  . Acute on chronic respiratory failure with hypoxia (HCC) 12/17/2018  . Hyperlipidemia  02/12/2017  . Physical exam 08/14/2016  . Femoral-popliteal bypass graft occlusion, left (HCC) 05/21/2016  . Allergic rhinitis 05/13/2016  . Protein-calorie malnutrition, severe (HCC) 04/08/2016  . Pain in joint, lower leg 10/17/2014  . Encounter for post surgical wound check 12/09/2013  . Peripheral vascular disease, unspecified (HCC) 11/08/2013  . Leg edema, left, foot 06/22/2013  . Cellulitis of foot, left 06/09/2013  . Non-healing wound of lower extremity, lt great toe 06/09/2013  . Cigarette smoker 06/09/2013  . Claudication, lifestyle limiting 05/11/2013  . Unexplained weight loss 04/28/2013  . PAD (peripheral artery disease), PTA/stent IDEV & chocolate baloon 06/08/13, Previous PTA/Stent to Rt SFA 05/10/13 04/13/2013  . COPD III 04/13/2013  . HTN (hypertension) 04/13/2013  . Anxiety 04/13/2013  . Low back pain 04/13/2013    Past Surgical History:  Procedure Laterality Date  . ABDOMINAL AORTAGRAM  05/10/2013   Procedure: ABDOMINAL Ronny Flurry;  Surgeon: Runell Gess, MD;  Location: [facility] CATH LAB;  Service: Cardiovascular;;  . ABI     04/09/13; abnormal  . ANGIOPLASTY Left 05/21/2016   Procedure: ballon ANGIOPLASTY of femoral to anterior to tibial bypass graft;  Surgeon: Nada Libman, MD;  Location: [facility] OR;  Service: Vascular;  Laterality: Left;  . APPENDECTOMY  05/2001  . FEMORAL ARTERY STENT Right 05/10/13   2 stents Rt SFA  .  FEMORAL ARTERY STENT Left 06/08/2013  . FEMORAL-POPLITEAL BYPASS GRAFT Right 11/12/2013   Procedure: RIGHT  FEMORAL-BELOW KNEE POPLITEAL ARTERY BYPASS GRAFT USING NON- REVERSE RIGHT GREATER SAPHENOUS VEIN.;  Surgeon: Nada Libman, MD;  Location: [facility] OR;  Service: Vascular;  Laterality: Right;  . FEMORAL-TIBIAL BYPASS GRAFT Left 12/08/2015   Procedure:  LEFT FEMORAL-ANTERIOR TIBIAL ARTERY BYPASS GRAFT;  Surgeon: Nada Libman, MD;  Location: [facility] OR;  Service: Vascular;  Laterality: Left;  . FEMORAL-TIBIAL BYPASS GRAFT Left 09/20/2016   Procedure: REDO  BYPASS GRAFT FEMORAL-ANTERIOR TIBIAL ARTERY;  Surgeon: Nada Libman, MD;  Location: [facility] OR;  Service: Vascular;  Laterality: Left;  . LOWER EXTREMITY ANGIOGRAM Bilateral 05/10/2013   Procedure: LOWER EXTREMITY ANGIOGRAM;  Surgeon: Runell Gess, MD;  Location: [facility] CATH LAB;  Service: Cardiovascular;  Laterality: Bilateral;  . LOWER EXTREMITY ANGIOGRAM N/A 06/08/2013   Procedure: LOWER EXTREMITY ANGIOGRAM;  Surgeon: Runell Gess, MD;  Location: [facility] CATH LAB;  Service: Cardiovascular;  Laterality: N/A;  . LOWER EXTREMITY ANGIOGRAM N/A 10/28/2013   Procedure: LOWER EXTREMITY ANGIOGRAM;  Surgeon: Runell Gess, MD;  Location: [facility] CATH LAB;  Service: Cardiovascular;  Laterality: N/A;  . LOWER EXTREMITY ANGIOGRAM N/A 05/16/2014   Procedure: LOWER EXTREMITY ANGIOGRAM;  Surgeon: Runell Gess, MD;  Location: [facility] CATH LAB;  Service: Cardiovascular;  Laterality: N/A;  . PERCUTANEOUS STENT INTERVENTION Right 05/10/2013   Procedure: PERCUTANEOUS STENT INTERVENTION;  Surgeon: Runell Gess, MD;  Location: [facility] CATH LAB;  Service: Cardiovascular;  Laterality: Right;  rt sfa and popliteal stent x2  . PERIPHERAL VASCULAR CATHETERIZATION N/A 12/06/2015   Procedure: Abdominal Aortogram w/Lower Extremity;  Surgeon: Nada Libman, MD;  Location: [facility] INVASIVE CV LAB;  Service: Cardiovascular;  Laterality: N/A;  . PERIPHERAL VASCULAR CATHETERIZATION N/A 05/22/2016   Procedure: Abdominal Aortogram w/Lower Extremity;  Surgeon: Maeola Harman, MD;  Location: [facility] INVASIVE CV LAB;  Service: Cardiovascular;  Laterality: N/A;  . PERIPHERAL VASCULAR CATHETERIZATION N/A 09/17/2016   Procedure: Abdominal Aortogram w/Lower Extremity;  Surgeon: Nada Libman, MD;  Location: [facility] INVASIVE CV LAB;  Service: Cardiovascular;  Laterality: N/A;  . THROMBECTOMY FEMORAL ARTERY Left 05/21/2016   Procedure: THROMBECTOMY FEMORAL TO ANTERIOR TIBIAL BYPASS GRAFT;  Surgeon: Nada Libman, MD;  Location: [facility] OR;  Service: Vascular;   Laterality: Left;  Marland Kitchen VEIN HARVEST Left 12/08/2015   Procedure: USING NON REVERSE  LEFT GREATER SAPHENOUS VEIN;  Surgeon: Nada Libman, MD;  Location: [facility] OR;  Service: Vascular;  Laterality: Left;       Family History  Problem Relation Age of Onset  . Hyperlipidemia Mother   . Heart disease Mother   . Diabetes Mother   . Cancer Father        Liver    Social History   Tobacco Use  . Smoking status: Former Smoker    Packs/day: 0.25    Years: 50.00    Pack years: 12.50    Types: Cigarettes    Start date: 11/14/2015    Quit date: 11/13/2016    Years since quitting: 4.0  . Smokeless tobacco: Never Used  Vaping Use  . Vaping Use: Never used  Substance Use Topics  . Alcohol use: No    Alcohol/week: 0.0 standard drinks    Comment: 06/08/2013 "quit 02/2007; drank my share before that though"  . Drug use: No    Home Medications Prior to Admission medications   Medication Sig Start Date End Date Taking? Authorizing Provider  albuterol (  VENTOLIN HFA) 108 (90 Base) MCG/ACT inhaler Inhale 2 puffs into the lungs every 6 (six) hours as needed for wheezing or shortness of breath. 01/20/20   Nyoka Cowden, MD  azithromycin (ZITHROMAX Z-PAK) 250 MG tablet Take 2 tablets by mouth once on day 1. On days 2 through 5 take 1 tablet by mouth daily. 08/08/20   Cheryll Cockayne, MD  busPIRone (BUSPAR) 7.5 MG tablet TAKE 1 TABLET BY MOUTH TWICE A DAY 07/17/20   Sheliah Hatch, MD  cilostazol (PLETAL) 100 MG tablet TAKE ONE TABLET BY MOUTH TWICE DAILY BEFORE MEALS 10/16/20   Nada Libman, MD  clopidogrel (PLAVIX) 75 MG tablet TAKE 1 TABLET BY MOUTH EVERY DAY 07/17/20   Maeola Harman, MD  ipratropium-albuterol (DUONEB) 0.5-2.5 (3) MG/3ML SOLN TAKE THREE MLS VIA NEBULIZER EVERY 6 HOURS AS NEEDED 02/11/20   Nyoka Cowden, MD  oxyCODONE (ROXICODONE) 15 MG immediate release tablet Take 15 mg by mouth every 4 (four) hours. 12/03/18   [provider]  sertraline (ZOLOFT) 25 MG  tablet Take 1 tablet (25 mg total) by mouth daily. 10/12/20   Sheliah Hatch, MD    Allergies    Clindamycin/lincomycin  Review of Systems   Review of Systems  Constitutional: Negative for fever.  HENT: Negative for sore throat.   Eyes: Negative for visual disturbance.  Respiratory: Negative for shortness of breath.   Cardiovascular: Negative for chest pain.  Gastrointestinal: Negative for abdominal pain.  Genitourinary: Negative for dysuria.  Musculoskeletal: Positive for back pain. Negative for neck pain.  Skin: Negative for rash.  Neurological: Negative for headaches.    Physical Exam Updated Vital Signs BP (!) 135/121 (BP Location: Left Arm)   Pulse (!) 130   Resp 15   SpO2 97%   Physical Exam Vitals and nursing note reviewed.  Constitutional:      Appearance: Normal appearance. He is well-developed.  HENT:     Head: Normocephalic and atraumatic.  Eyes:     Conjunctiva/sclera: Conjunctivae normal.  Cardiovascular:     Rate and Rhythm: Regular rhythm. Tachycardia present.     Heart sounds: No murmur heard.   Pulmonary:     Effort: Pulmonary effort is normal. No respiratory distress.     Breath sounds: Normal breath sounds.  Abdominal:     Palpations: Abdomen is soft.     Tenderness: There is no abdominal tenderness.  Musculoskeletal:        General: Tenderness present. No deformity.     Cervical back: Neck supple.     Right lower leg: No edema.     Left lower leg: No edema.     Comments: I am unable to get any Doppler signal DP or PT. he has a palpable graft in the lateral part of the thigh.  I do not palpate any popliteal or distal pulses.  Calf is tender to the touch.  Both feet are cool, not mottled  Skin:    General: Skin is warm and dry.  Neurological:     General: No focal deficit present.     Mental Status: He is alert.     ED Results / Procedures / Treatments   Labs (all labs ordered are listed, but only abnormal results are  displayed) Labs Reviewed  BASIC METABOLIC PANEL - Abnormal; Notable for the following components:      Result Value   Glucose, Bld 257 (*)    All other components within normal limits  CBC WITH  DIFFERENTIAL/PLATELET - Abnormal; Notable for the following components:   WBC 13.8 (*)    Neutro Abs 11.7 (*)    Abs Immature Granulocytes 0.08 (*)    All other components within normal limits  CBC - Abnormal; Notable for the following components:   WBC 11.7 (*)    All other components within normal limits  HEPARIN LEVEL (UNFRACTIONATED) - Abnormal; Notable for the following components:   Heparin Unfractionated 0.74 (*)    All other components within normal limits  COMPREHENSIVE METABOLIC PANEL - Abnormal; Notable for the following components:   Glucose, Bld 109 (*)    All other components within normal limits  I-STAT CHEM 8, ED - Abnormal; Notable for the following components:   Glucose, Bld 264 (*)    All other components within normal limits  RESP PANEL BY RT-PCR (FLU A&B, COVID) ARPGX2  PROTIME-INR  APTT  PROTIME-INR  URINALYSIS, ROUTINE W REFLEX MICROSCOPIC  HEPARIN LEVEL (UNFRACTIONATED)    EKG None  Radiology CT ANGIO AO+BIFEM W & OR WO CONTRAST  Result Date: 12/06/2020 CLINICAL DATA:  Claudication, left leg pain EXAM: CT ANGIOGRAPHY OF ABDOMINAL AORTA WITH ILIOFEMORAL RUNOFF TECHNIQUE: Multidetector CT imaging of the abdomen, pelvis and lower extremities was performed using the standard protocol during bolus administration of intravenous contrast. Multiplanar CT image reconstructions and MIPs were obtained to evaluate the vascular anatomy. CONTRAST:  OMNIPAQUE IOHEXOL 350 MG/ML SOLN COMPARISON:  None. FINDINGS: VASCULAR Aorta: Calcified and noncalcified plaque throughout the aorta. 3.9 cm infrarenal abdominal aortic aneurysm with extensive mural plaque. Celiac: Patent without evidence of aneurysm, dissection, vasculitis or significant stenosis. SMA: Patent without evidence of  aneurysm, dissection, vasculitis or significant stenosis. Renals: Both renal arteries are patent without evidence of aneurysm, dissection, vasculitis, fibromuscular dysplasia or significant stenosis. IMA: Patent RIGHT Lower Extremity Inflow: Aneurysmal dilatation of the common iliac artery, 2.2 cm with mural plaque. Mild atherosclerotic calcifications in the external iliac artery and common femoral artery. Outflow: Occlusion of the right superficial femoral artery proximally. Stent is seen in the distal SFA and popliteal artery. No flow seen. Profundal femoris is patent. Runoff: Reconstitution below the popliteal artery stent. Three-vessel runoff noted into the distal calf with dominant runoff via the posterior tibial. LEFT Lower Extremity Inflow: Calcifications throughout the iliofemoral vessels on the left. No aneurysm or dissection. Outflow: Complete occlusion of the right common femoral artery. Reconstitution of the profundus femoris. Native superficial femoral artery is occluded. There is a femoral bypass graft which courses anteriorly in the left thigh, then laterally at the left knee. This is occluded. Runoff: No flow seen within the trifurcation vessels in the left calf. Veins: Grossly unremarkable. Review of the MIP images confirms the above findings. NON-VASCULAR Lower chest: No acute abnormality. Hepatobiliary: Visualized liver unremarkable. Liver is not imaged in its entirety. Gallbladder grossly unremarkable. Pancreas: No focal abnormality or ductal dilatation. Spleen: No focal abnormality.  Normal size. Adrenals/Urinary Tract: No adrenal abnormality. No focal renal abnormality. No stones or hydronephrosis. Urinary bladder is unremarkable. Stomach/Bowel: Sigmoid diverticulosis. No active diverticulitis. Stomach and small bowel decompressed, Sigmoid diverticulosis. No active diverticulitis. Stomach and small bowel decompressed, unremarkable. Lymphatic: No adenopathy Reproductive: No visible focal  abnormality. Other: No free fluid or free air. Musculoskeletal: No acute bony abnormality. IMPRESSION: VASCULAR Infrarenal abdominal aortic aneurysm measuring 3.9 cm. Recommend follow-up ultrasound every 2 years. This recommendation follows ACR consensus guidelines: White Paper of the ACR Incidental Findings Committee II on Vascular Findings. J Am Coll Radiol 2013; 10:789-794. Aneurysmal dilatation  of the right common iliac artery, 2.2 cm. Complete occlusion of the right superficial femoral artery with reconstitution of the popliteal artery below the popliteal artery stent. Three-vessel runoff in the right calf. Complete occlusion of the left common femoral artery and left femoral bypass graft which courses anteriorly and laterally in the thigh. No visible contrast opacification in the runoff vessels in the left calf. NON-VASCULAR Sigmoid diverticulosis. Electronically Signed   By: Charlett NoseKevin  Dover M.D.   On: 12/06/2020 22:14    Procedures .Critical Care Performed by: Terrilee FilesButler, Vinnie Gombert C, MD Authorized by: Terrilee FilesButler, Vanna Sailer C, MD   Critical care provider statement:    Critical care time (minutes):  45   Critical care time was exclusive of:  Separately billable procedures and treating other patients   Critical care was necessary to treat or prevent imminent or life-threatening deterioration of the following conditions:  Circulatory failure   Critical care was time spent personally by me on the following activities:  Discussions with consultants, evaluation of patient's response to treatment, examination of patient, ordering and performing treatments and interventions, ordering and review of laboratory studies, ordering and review of radiographic studies, pulse oximetry, re-evaluation of patient's condition, obtaining history from patient or surrogate, review of old charts and development of treatment plan with patient or surrogate     Medications Ordered in ED Medications  heparin ADULT infusion 100  units/mL (25000 units/23050mL) (1,250 Units/hr Intravenous New Bag/Given 12/06/20 2321)  oxyCODONE (Oxy IR/ROXICODONE) immediate release tablet 15 mg (15 mg Oral Given 12/07/20 0831)  busPIRone (BUSPAR) tablet 7.5 mg (7.5 mg Oral Given 12/07/20 0830)  sertraline (ZOLOFT) tablet 25 mg (25 mg Oral Given 12/07/20 0830)  clopidogrel (PLAVIX) tablet 75 mg (75 mg Oral Given 12/07/20 0831)  albuterol (VENTOLIN HFA) 108 (90 Base) MCG/ACT inhaler 2 puff (has no administration in time range)  ipratropium-albuterol (DUONEB) 0.5-2.5 (3) MG/3ML nebulizer solution 3 mL (has no administration in time range)  ondansetron (ZOFRAN) injection 4 mg (has no administration in time range)  alum & mag hydroxide-simeth (MAALOX/MYLANTA) 200-200-20 MG/5ML suspension 15-30 mL (has no administration in time range)  pantoprazole (PROTONIX) EC tablet 40 mg (40 mg Oral Given 12/07/20 0830)  labetalol (NORMODYNE) injection 10 mg (has no administration in time range)  hydrALAZINE (APRESOLINE) injection 5 mg (has no administration in time range)  metoprolol tartrate (LOPRESSOR) injection 2-5 mg (has no administration in time range)  guaiFENesin-dextromethorphan (ROBITUSSIN DM) 100-10 MG/5ML syrup 15 mL (has no administration in time range)  phenol (CHLORASEPTIC) mouth spray 1 spray (has no administration in time range)  morphine 4 MG/ML injection 4 mg (has no administration in time range)  acetaminophen (TYLENOL) tablet 650 mg (650 mg Oral Given 12/07/20 0830)  morphine 4 MG/ML injection 4 mg (4 mg Intravenous Given 12/06/20 2103)  sodium chloride 0.9 % bolus 500 mL (500 mLs Intravenous Bolus 12/06/20 2103)  HYDROmorphone (DILAUDID) injection 1 mg (1 mg Intravenous Given 12/06/20 2214)  iohexol (OMNIPAQUE) 350 MG/ML injection 100 mL (100 mLs Intravenous Contrast Given 12/06/20 2153)  heparin bolus via infusion 4,500 Units (4,500 Units Intravenous Bolus from Bag 12/06/20 2321)  potassium chloride SA (KLOR-CON) CR tablet 20-40 mEq (20 mEq Oral  Given 12/07/20 0119)    ED Course  I have reviewed the triage vital signs and the nursing notes.  Pertinent labs & imaging results that were available during my care of the patient were reviewed by me and considered in my medical decision making (see chart for details).  Clinical Course as  of 12/07/20 5409  Wed Dec 06, 2020  2053 The patient was admitted to the hospital and taken to the operating room on 09/20/2016 and underwent:  #1 Left femoral to anterior tibial bypass with 6mm ringed Propatent PTFE #2 Re-do exposure of left femoral and anterior tibial arteries [MB]  2133 Discussed with Dr. Lenell Antu vascular surgery who recommended a heparin bolus and drip and will be in to see the patient. [MB]    Clinical Course User Index [MB] Terrilee Files, MD   MDM Rules/Calculators/A&P                         This patient complains of acute left leg pain this involves an extensive number of treatment Options and is a complaint that carries with it a high risk of complications and Morbidity. The differential includes arterial occlusion, graft failure, musculoskeletal, DVT, radiculopathy  I ordered, reviewed and interpreted labs, which included CBC with mildly elevated white count, normal hemoglobin, chemistries fairly normal other than elevated glucose, COVID testing negative, INR normal I ordered medication IV pain medications, IV heparin I ordered imaging studies which included CT angio lower extremities with runoffs and I independently    visualized and interpreted imaging which showed multiple chronic occlusions, acute occlusion left femoral  Previous records obtained and reviewed in epic including prior operative note Dr. Lynnell Jude I consulted vascular surgery Dr. Lenell Antu and discussed lab and imaging findings  Critical Interventions: Evaluation and management of patient's acute arterial occlusion with IV heparin  After the interventions stated above, I reevaluated the patient and found  patient still to be symptomatic although more comfortable.  He is agreement for admission to the hospital for further management.   Final Clinical Impression(s) / ED Diagnoses Final diagnoses:  Arterial occlusion, lower extremity [facility])    Rx / DC Orders ED Discharge Orders    None       Terrilee Files, MD 12/07/20 [phone]

## 2020-12-06 NOTE — H&P (Signed)
[facility] [place]  ASSESSMENT / PLAN: 75 y.o. male with occluded left common femoral artery and thrombosed left femoral-anterior tibial artery bypass (occluded since 2018) causing acute on chronic ischemic rest pain. Admit to 4E. Start IV heparin. OK for diet. NPO after midnight.   CHIEF COMPLAINT: left foot pain  HISTORY OF PRESENT ILLNESS: Christopher Pena is a 75 y.o. male well-known to our service.  He is a patient of Dr. Estanislado Spire who is undergone multiple revascularization efforts in the bilateral lower extremities, see below for details.  Patient was lost to follow-up in 2020.  He reports he had been doing well with ambulation until earlier today.  He has severe COPD and has a difficult time ambulating because of dyspnea.  I suspect he does not walk fast enough to claudicate.  Earlier today he had abrupt onset of left lower extremity pain about the thigh, knee, calf, and foot.  Gradually the pain improved in his thigh and calf.  The pain persists in his foot.  He reports diminished sensation across the left foot, but states this is improving.  He has a difficult time walking since onset of symptoms, but is able to move his lower extremity.  VASCULAR SURGICAL HISTORY:  Remote SFA stenting bilaterally 11/12/13 - right femoral to below-knee popliteal artery bypass with GSV 12/06/15 - lower extremity angiogram for left toe ulcer 12/08/15 - left femoral to anterior tibial artery bypass with greater saphenous vein 05/22/16 - left fem-tibial bypass thrombectomy 05/22/16 - left fem-tibial bypass angiogram and angioplasty 09/17/16 - left lower extremity angiogram for left foot ulcer 09/20/16 - redo left femoral to anterior tibial bypass with PTFE  Past Medical History:  Diagnosis Date  . Anxiety   . Chronic low back pain    "degenerative spine dx'd ~ 7 yr ago" (06/08/2013)  . Claudication (HCC)   . Constipation due to pain medication   . COPD (chronic obstructive pulmonary  disease) (HCC)   . Hypertension    "moderately high; RX didn't help" (06/08/2013) - no longer on meds (as of 09/19/16)  . Neuromuscular disorder (HCC)    degen spine  . Non-healing wound of lower extremity 06/09/2013  . PAD (peripheral artery disease), PTA/stent IDEV & chocolate baloon 06/08/13 04/13/2013   Severely reduced ABI's of 0.3 bilaterally   . Tobacco use 06/09/2013  . Unexplained weight loss     Past Surgical History:  Procedure Laterality Date  . ABDOMINAL AORTAGRAM  05/10/2013   Procedure: ABDOMINAL Ronny Flurry;  Surgeon: Runell Gess, MD;  Location: [facility] CATH LAB;  Service: Cardiovascular;;  . ABI     04/09/13; abnormal  . ANGIOPLASTY Left 05/21/2016   Procedure: ballon ANGIOPLASTY of femoral to anterior to tibial bypass graft;  Surgeon: Nada Libman, MD;  Location: [facility] OR;  Service: Vascular;  Laterality: Left;  . APPENDECTOMY  05/2001  . FEMORAL ARTERY STENT Right 05/10/13   2 stents Rt SFA  . FEMORAL ARTERY STENT Left 06/08/2013  . FEMORAL-POPLITEAL BYPASS GRAFT Right 11/12/2013   Procedure: RIGHT  FEMORAL-BELOW KNEE POPLITEAL ARTERY BYPASS GRAFT USING NON- REVERSE RIGHT GREATER SAPHENOUS VEIN.;  Surgeon: Nada Libman, MD;  Location: [facility] OR;  Service: Vascular;  Laterality: Right;  . FEMORAL-TIBIAL BYPASS GRAFT Left 12/08/2015   Procedure:  LEFT FEMORAL-ANTERIOR TIBIAL ARTERY BYPASS GRAFT;  Surgeon: Nada Libman, MD;  Location: [facility] OR;  Service: Vascular;  Laterality: Left;  . FEMORAL-TIBIAL BYPASS GRAFT Left 09/20/2016   Procedure: REDO BYPASS GRAFT FEMORAL-ANTERIOR  TIBIAL ARTERY;  Surgeon: Nada Libman, MD;  Location: [facility] OR;  Service: Vascular;  Laterality: Left;  . LOWER EXTREMITY ANGIOGRAM Bilateral 05/10/2013   Procedure: LOWER EXTREMITY ANGIOGRAM;  Surgeon: Runell Gess, MD;  Location: [facility] CATH LAB;  Service: Cardiovascular;  Laterality: Bilateral;  . LOWER EXTREMITY ANGIOGRAM N/A 06/08/2013   Procedure: LOWER EXTREMITY ANGIOGRAM;  Surgeon: Runell Gess, MD;   Location: [facility] CATH LAB;  Service: Cardiovascular;  Laterality: N/A;  . LOWER EXTREMITY ANGIOGRAM N/A 10/28/2013   Procedure: LOWER EXTREMITY ANGIOGRAM;  Surgeon: Runell Gess, MD;  Location: [facility] CATH LAB;  Service: Cardiovascular;  Laterality: N/A;  . LOWER EXTREMITY ANGIOGRAM N/A 05/16/2014   Procedure: LOWER EXTREMITY ANGIOGRAM;  Surgeon: Runell Gess, MD;  Location: [facility] CATH LAB;  Service: Cardiovascular;  Laterality: N/A;  . PERCUTANEOUS STENT INTERVENTION Right 05/10/2013   Procedure: PERCUTANEOUS STENT INTERVENTION;  Surgeon: Runell Gess, MD;  Location: [facility] CATH LAB;  Service: Cardiovascular;  Laterality: Right;  rt sfa and popliteal stent x2  . PERIPHERAL VASCULAR CATHETERIZATION N/A 12/06/2015   Procedure: Abdominal Aortogram w/Lower Extremity;  Surgeon: Nada Libman, MD;  Location: [facility] INVASIVE CV LAB;  Service: Cardiovascular;  Laterality: N/A;  . PERIPHERAL VASCULAR CATHETERIZATION N/A 05/22/2016   Procedure: Abdominal Aortogram w/Lower Extremity;  Surgeon: Maeola Harman, MD;  Location: [facility] INVASIVE CV LAB;  Service: Cardiovascular;  Laterality: N/A;  . PERIPHERAL VASCULAR CATHETERIZATION N/A 09/17/2016   Procedure: Abdominal Aortogram w/Lower Extremity;  Surgeon: Nada Libman, MD;  Location: [facility] INVASIVE CV LAB;  Service: Cardiovascular;  Laterality: N/A;  . THROMBECTOMY FEMORAL ARTERY Left 05/21/2016   Procedure: THROMBECTOMY FEMORAL TO ANTERIOR TIBIAL BYPASS GRAFT;  Surgeon: Nada Libman, MD;  Location: [facility] OR;  Service: Vascular;  Laterality: Left;  Marland Kitchen VEIN HARVEST Left 12/08/2015   Procedure: USING NON REVERSE  LEFT GREATER SAPHENOUS VEIN;  Surgeon: Nada Libman, MD;  Location: [facility] OR;  Service: Vascular;  Laterality: Left;    Family History  Problem Relation Age of Onset  . Hyperlipidemia Mother   . Heart disease Mother   . Diabetes Mother   . Cancer Father        Liver    Social History   Socioeconomic History  . Marital status: Divorced    Spouse  name: Not on file  . Number of children: Not on file  . Years of education: Not on file  . Highest education level: Not on file  Occupational History  . Occupation: former Journalist, newspaper  Tobacco Use  . Smoking status: Former Smoker    Packs/day: 0.25    Years: 50.00    Pack years: 12.50    Types: Cigarettes    Start date: 11/14/2015    Quit date: 11/13/2016    Years since quitting: 4.0  . Smokeless tobacco: Never Used  Vaping Use  . Vaping Use: Never used  Substance and Sexual Activity  . Alcohol use: No    Alcohol/week: 0.0 standard drinks    Comment: 06/08/2013 "quit 02/2007; drank my share before that though"  . Drug use: No  . Sexual activity: Yes  Other Topics Concern  . Not on file  Social History Narrative  . Not on file   Social Determinants of Health   Financial Resource Strain: Not on file  Food Insecurity: No Food Insecurity  . Worried About Programme researcher, broadcasting/film/video in the Last Year: Never true  . Ran Out of Food in the Last Year: Never  true  Transportation Needs: No Transportation Needs  . Lack of Transportation (Medical): No  . Lack of Transportation (Non-Medical): No  Physical Activity: Not on file  Stress: Not on file  Social Connections: Not on file  Intimate Partner Violence: Not on file    Allergies  Allergen Reactions  . Clindamycin/Lincomycin Diarrhea    Current Facility-Administered Medications  Medication Dose Route Frequency Provider Last Rate Last Admin  . heparin ADULT infusion 100 units/mL (25000 units/26350mL)  1,250 Units/hr Intravenous Continuous Daylene PoseyOriet, Jonathan, RPH      . heparin bolus via infusion 4,500 Units  4,500 Units Intravenous Once Daylene PoseyOriet, Jonathan, [facility]       Current Outpatient Medications  Medication Sig Dispense Refill  . albuterol (VENTOLIN HFA) 108 (90 Base) MCG/ACT inhaler Inhale 2 puffs into the lungs every 6 (six) hours as needed for wheezing or shortness of breath. 18 g 3  . azithromycin (ZITHROMAX Z-PAK) 250 MG tablet Take 2  tablets by mouth once on day 1. On days 2 through 5 take 1 tablet by mouth daily. 6 tablet 0  . busPIRone (BUSPAR) 7.5 MG tablet TAKE 1 TABLET BY MOUTH TWICE A DAY 180 tablet 1  . cilostazol (PLETAL) 100 MG tablet TAKE ONE TABLET BY MOUTH TWICE DAILY BEFORE MEALS 180 tablet 1  . clopidogrel (PLAVIX) 75 MG tablet TAKE 1 TABLET BY MOUTH EVERY DAY 90 tablet 1  . ipratropium-albuterol (DUONEB) 0.5-2.5 (3) MG/3ML SOLN TAKE THREE MLS VIA NEBULIZER EVERY 6 HOURS AS NEEDED 360 mL 0  . oxyCODONE (ROXICODONE) 15 MG immediate release tablet Take 15 mg by mouth every 4 (four) hours.    . sertraline (ZOLOFT) 25 MG tablet Take 1 tablet (25 mg total) by mouth daily. 90 tablet 2    REVIEW OF SYSTEMS:  [X]  denotes positive finding, [ ]  denotes negative finding Cardiac  Comments:  Chest pain or chest pressure:    Shortness of breath upon exertion:    Short of breath when lying flat:    Irregular heart rhythm:        Vascular    Pain in calf, thigh, or hip brought on by ambulation: x   Pain in feet at night that wakes you up from your sleep:  x   Blood clot in your veins:    Leg swelling:         Pulmonary    Oxygen at home:    Productive cough:     Wheezing:         Neurologic    Sudden weakness in arms or legs:     Sudden numbness in arms or legs:     Sudden onset of difficulty speaking or slurred speech:    Temporary loss of vision in one eye:     Problems with dizziness:         Gastrointestinal    Blood in stool:     Vomited blood:         Genitourinary    Burning when urinating:     Blood in urine:        Psychiatric    Major depression:         Hematologic    Bleeding problems:    Problems with blood clotting too easily:        Skin    Rashes or ulcers:        Constitutional    Fever or chills:      PHYSICAL EXAM  Vitals:   12/06/20 2200  12/06/20 2201 12/06/20 2215 12/06/20 2230  BP:  (!) 144/83 (!) 166/78 (!) 152/93  Pulse: (!) 114 (!) 113 (!) 121 (!) 111  Resp:   18  20  Temp: 97.9 F (36.6 C)     TempSrc: Oral     SpO2: 97% 98% 96% 97%    Constitutional: chronically ill appearing. no distress. Appears well nourished.  Neurologic: CN intact. no focal findings. no sensory loss. Motor / sensory function normal in left foot. Psychiatric: Mood and affect symmetric and appropriate. Eyes: No icterus. No conjunctival pallor. Ears, nose, throat: mucous membranes moist. Midline trachea.  Cardiac: regular rate and rhythm.  Respiratory: unlabored. Abdominal: soft, non-tender, non-distended.  Peripheral vascular:  No doppler flow in the left foot  Absent L femoral pulse  Cool L foot Extremity: No edema. No cyanosis. No pallor.  Skin: No gangrene. No ulceration.  Lymphatic: No Stemmer's sign. No palpable lymphadenopathy.  PERTINENT LABORATORY AND RADIOLOGIC DATA  Most recent CBC CBC Latest Ref Rng & Units 12/06/2020 12/06/2020 08/08/2020  WBC 4.0 - 10.5 K/uL - 13.8(H) 18.9(H)  Hemoglobin 13.0 - 17.0 g/dL 82.9 56.2 13.0  Hematocrit 39.0 - 52.0 % 48.0 46.0 45.8  Platelets 150 - 400 K/uL - 379 369     Most recent CMP CMP Latest Ref Rng & Units 12/06/2020 12/06/2020 08/08/2020  Glucose 70 - 99 mg/dL 865(H) 846(N) 629(B)  BUN 8 - 23 mg/dL 13 11 6(L)  Creatinine 0.61 - 1.24 mg/dL 2.84 1.32 4.40  Sodium 135 - 145 mmol/L 139 136 137  Potassium 3.5 - 5.1 mmol/L 3.6 3.5 3.8  Chloride 98 - 111 mmol/L 99 99 100  CO2 22 - 32 mmol/L - 25 21(L)  Calcium 8.9 - 10.3 mg/dL - 9.3 9.0  Total Protein 6.5 - 8.1 g/dL - - 8.6(H)  Total Bilirubin 0.3 - 1.2 mg/dL - - 1.1  Alkaline Phos 38 - 126 U/L - - 71  AST 15 - 41 U/L - - 25  ALT 0 - 44 U/L - - 17    Renal function CrCl cannot be calculated (Unknown ideal weight.).  Hgb A1c MFr Bld (%)  Date Value  12/17/2018 5.7 (H)    LDL Cholesterol  Date Value Ref Range Status  04/05/2019 103 (H) 0 - 99 mg/dL Final     CTA 08/20/70 39 mm infrarenal abdominal aortic aneurysm 22 mm right common iliac artery  aneurysm Left lower extremity with occluded left common femoral artery.  Left femoral tibial bypass is occluded which is not a new finding.  There is no flow visualized below the knee on the left. Right SFA occlusion with reconstitution of the popliteal artery and preserved tibial flow.  Rande Brunt. Lenell Antu, MD [facility] Phone Number: [phone] 12/06/2020 10:57 PM

## 2020-12-06 NOTE — ED Notes (Signed)
Patient transported to CT 

## 2020-12-06 NOTE — Progress Notes (Signed)
ANTICOAGULATION CONSULT NOTE - Initial Consult  Pharmacy Consult for heparin Indication: Ischemic leg  Allergies  Allergen Reactions  . Clindamycin/Lincomycin Diarrhea    Patient Measurements:   Heparin Dosing Weight: 79kg Vital Signs: Temp: 97.9 F (36.6 C) (04/20 2200) Temp Source: Oral (04/20 2200) BP: 144/83 (04/20 2201) Pulse Rate: 113 (04/20 2201)  Labs: Recent Labs    12/06/20 1858 12/06/20 1920  HGB 15.1 16.3  HCT 46.0 48.0  PLT 379  --   APTT 32  --   LABPROT 13.3  --   INR 1.0  --   CREATININE 1.17 1.00    CrCl cannot be calculated (Unknown ideal weight.).   Medical History: Past Medical History:  Diagnosis Date  . Anxiety   . Chronic low back pain    "degenerative spine dx'd ~ 7 yr ago" (06/08/2013)  . Claudication (HCC)   . Constipation due to pain medication   . COPD (chronic obstructive pulmonary disease) (HCC)   . Hypertension    "moderately high; RX didn't help" (06/08/2013) - no longer on meds (as of 09/19/16)  . Neuromuscular disorder (HCC)    degen spine  . Non-healing wound of lower extremity 06/09/2013  . PAD (peripheral artery disease), PTA/stent IDEV & chocolate baloon 06/08/13 04/13/2013   Severely reduced ABI's of 0.3 bilaterally   . Tobacco use 06/09/2013  . Unexplained weight loss    Assessment: 74 YOM presenting with leg pain, hx bypass, to start heparin gtt for ischemic leg.  He is not on anticoagulation PTA, CBC wnl.   Goal of Therapy:  Heparin level 0.3-0.7 units/ml Monitor platelets by anticoagulation protocol: Yes   Plan:  Heparin 4500 units IV x 1, and gtt at 1250 units/hr F/u 8 hour heparin level  Daylene Posey, PharmD Clinical Pharmacist ED Pharmacist Phone # [phone] 12/06/2020 10:27 PM

## 2020-12-06 NOTE — ED Triage Notes (Signed)
Emergency Medicine Provider Triage Evaluation Note  Christopher Pena , a 75 y.o. male  was evaluated in triage.  Pt complains of severe left leg pain.  Pain started about 2 hours prior to arrival, was initially going throughout his whole leg, but now is most severe in his calf.  No associated swelling.  Patient has history of multiple bypasses to the left leg, most recently about 4 years ago, is on Plavix but no other blood thinners.  Does have a faint palpable pulse in the left foot per EMS  Review of Systems  Positive: Leg pain Negative: Swelling, chest pain, fever  Physical Exam  There were no vitals taken for this visit. Gen:   Awake, no distress   HEENT:  Atraumatic  Resp:  Normal effort  Cardiac:  Normal rate  Abd:   Nondistended MSK:   Moves extremities without difficulty, left leg tender to palpation primarily in the calf, faintly palpable DP pulse Neuro:  Speech clear   Medical Decision Making  Medically screening exam initiated at 6:56 PM.  Appropriate orders placed.  [name] was informed that the remainder of the evaluation will be completed by another provider, this initial triage assessment does not replace that evaluation, and the importance of remaining in the ED until their evaluation is complete.  Concern for left lower leg ischemia, labs and CTA runoff ordered, patient will need next bed.  Clinical Impression  1.  Left leg pain   Christopher Pena, [place] 12/06/20 1906

## 2020-12-06 NOTE — ED Triage Notes (Signed)
Pt here from home with c/o left leg pain that started approx 2 hrs ago , pt has a fem pop 4 years ago , does have a pulse in that foot

## 2020-12-07 DIAGNOSIS — Z9981 Dependence on supplemental oxygen: Secondary | ICD-10-CM | POA: Diagnosis not present

## 2020-12-07 DIAGNOSIS — I10 Essential (primary) hypertension: Secondary | ICD-10-CM | POA: Diagnosis present

## 2020-12-07 DIAGNOSIS — Z7902 Long term (current) use of antithrombotics/antiplatelets: Secondary | ICD-10-CM | POA: Diagnosis not present

## 2020-12-07 DIAGNOSIS — J9611 Chronic respiratory failure with hypoxia: Secondary | ICD-10-CM | POA: Diagnosis present

## 2020-12-07 DIAGNOSIS — Z83438 Family history of other disorder of lipoprotein metabolism and other lipidemia: Secondary | ICD-10-CM | POA: Diagnosis not present

## 2020-12-07 DIAGNOSIS — Z833 Family history of diabetes mellitus: Secondary | ICD-10-CM | POA: Diagnosis not present

## 2020-12-07 DIAGNOSIS — G8929 Other chronic pain: Secondary | ICD-10-CM | POA: Diagnosis present

## 2020-12-07 DIAGNOSIS — Z800 Family history of malignant neoplasm of digestive organs: Secondary | ICD-10-CM | POA: Diagnosis not present

## 2020-12-07 DIAGNOSIS — Z888 Allergy status to other drugs, medicaments and biological substances status: Secondary | ICD-10-CM | POA: Diagnosis not present

## 2020-12-07 DIAGNOSIS — I70209 Unspecified atherosclerosis of native arteries of extremities, unspecified extremity: Secondary | ICD-10-CM | POA: Diagnosis present

## 2020-12-07 DIAGNOSIS — I70222 Atherosclerosis of native arteries of extremities with rest pain, left leg: Secondary | ICD-10-CM | POA: Diagnosis present

## 2020-12-07 DIAGNOSIS — I70229 Atherosclerosis of native arteries of extremities with rest pain, unspecified extremity: Secondary | ICD-10-CM | POA: Diagnosis present

## 2020-12-07 DIAGNOSIS — Y713 Surgical instruments, materials and cardiovascular devices (including sutures) associated with adverse incidents: Secondary | ICD-10-CM | POA: Diagnosis present

## 2020-12-07 DIAGNOSIS — M545 Low back pain, unspecified: Secondary | ICD-10-CM | POA: Diagnosis present

## 2020-12-07 DIAGNOSIS — Z79899 Other long term (current) drug therapy: Secondary | ICD-10-CM | POA: Diagnosis not present

## 2020-12-07 DIAGNOSIS — J449 Chronic obstructive pulmonary disease, unspecified: Secondary | ICD-10-CM | POA: Diagnosis present

## 2020-12-07 DIAGNOSIS — Z8249 Family history of ischemic heart disease and other diseases of the circulatory system: Secondary | ICD-10-CM | POA: Diagnosis not present

## 2020-12-07 DIAGNOSIS — Z20822 Contact with and (suspected) exposure to covid-19: Secondary | ICD-10-CM | POA: Diagnosis present

## 2020-12-07 DIAGNOSIS — T82818A Embolism of vascular prosthetic devices, implants and grafts, initial encounter: Secondary | ICD-10-CM | POA: Diagnosis present

## 2020-12-07 DIAGNOSIS — Z87891 Personal history of nicotine dependence: Secondary | ICD-10-CM | POA: Diagnosis not present

## 2020-12-07 LAB — COMPREHENSIVE METABOLIC PANEL
ALT: 12 U/L (ref 0–44)
AST: 21 U/L (ref 15–41)
Albumin: 3.5 g/dL (ref 3.5–5.0)
Alkaline Phosphatase: 54 U/L (ref 38–126)
Anion gap: 8 (ref 5–15)
BUN: 10 mg/dL (ref 8–23)
CO2: 29 mmol/L (ref 22–32)
Calcium: 8.9 mg/dL (ref 8.9–10.3)
Chloride: 101 mmol/L (ref 98–111)
Creatinine, Ser: 1.03 mg/dL (ref 0.61–1.24)
GFR, Estimated: >60 mL/min (ref >60)
Glucose, Bld: 109 mg/dL — ABNORMAL HIGH (ref 70–99)
Potassium: 4.2 mmol/L (ref 3.5–5.1)
Sodium: 138 mmol/L (ref 135–145)
Total Bilirubin: 0.7 mg/dL (ref 0.3–1.2)
Total Protein: 7.2 g/dL (ref 6.5–8.1)

## 2020-12-07 LAB — CBC
HCT: 41.7 % (ref 39.0–52.0)
Hemoglobin: 13.7 g/dL (ref 13.0–17.0)
MCH: 30.9 pg (ref 26.0–34.0)
MCHC: 32.9 g/dL (ref 30.0–36.0)
MCV: 93.9 fL (ref 80.0–100.0)
Platelets: 297 10*3/uL (ref 150–400)
RBC: 4.44 MIL/uL (ref 4.22–5.81)
RDW: 14.6 % (ref 11.5–15.5)
WBC: 11.7 10*3/uL — ABNORMAL HIGH (ref 4.0–10.5)
nRBC: 0 % (ref 0.0–0.2)

## 2020-12-07 LAB — HEPARIN LEVEL (UNFRACTIONATED)
Heparin Unfractionated: 0.74 IU/mL — ABNORMAL HIGH (ref 0.30–0.70)
Heparin Unfractionated: 0.33 IU/mL (ref 0.30–0.70)

## 2020-12-07 LAB — PROTIME-INR
INR: 1.1 (ref 0.8–1.2)
Prothrombin Time: 14.5 seconds (ref 11.4–15.2)

## 2020-12-07 MED ORDER — LABETALOL HCL 5 MG/ML IV SOLN: 10.0000 mg | INTRAVENOUS | Status: DC | PRN

## 2020-12-07 MED ORDER — SERTRALINE HCL 25 MG PO TABS
25.0000 mg | ORAL_TABLET | Freq: Every day | ORAL | Status: DC
  Administered 2020-12-07 – 2020-12-10 (×4): 25 mg via ORAL
  Filled 2020-12-07 (×4): qty 1

## 2020-12-07 MED ORDER — BUSPIRONE HCL 5 MG PO TABS
7.5000 mg | ORAL_TABLET | Freq: Two times a day (BID) | ORAL | Status: DC
  Administered 2020-12-07 – 2020-12-10 (×7): 7.5 mg via ORAL
  Filled 2020-12-07 (×7): qty 2

## 2020-12-07 MED ORDER — POTASSIUM CHLORIDE CRYS ER 20 MEQ PO TBCR
20.0000 meq | EXTENDED_RELEASE_TABLET | Freq: Once | ORAL | Status: AC
  Administered 2020-12-07: 20 meq via ORAL
  Filled 2020-12-07: qty 1

## 2020-12-07 MED ORDER — PANTOPRAZOLE SODIUM 40 MG PO TBEC
40.0000 mg | DELAYED_RELEASE_TABLET | Freq: Every day | ORAL | Status: DC
  Administered 2020-12-07: 40 mg via ORAL
  Filled 2020-12-07: qty 1

## 2020-12-07 MED ORDER — OXYCODONE HCL 5 MG PO TABS
15.0000 mg | ORAL_TABLET | ORAL | Status: DC
  Administered 2020-12-07 – 2020-12-10 (×21): 15 mg via ORAL
  Filled 2020-12-07 (×21): qty 3

## 2020-12-07 MED ORDER — PHENOL 1.4 % MT LIQD: 1.0000 | OROMUCOSAL | Status: DC | PRN

## 2020-12-07 MED ORDER — ALUM & MAG HYDROXIDE-SIMETH 200-200-20 MG/5ML PO SUSP
15.0000 mL | ORAL | Status: DC | PRN
Start: 2020-12-07 — End: 2020-12-08

## 2020-12-07 MED ORDER — ONDANSETRON HCL 4 MG/2ML IJ SOLN: 4.0000 mg | Freq: Four times a day (QID) | INTRAMUSCULAR | Status: DC | PRN

## 2020-12-07 MED ORDER — CLOPIDOGREL BISULFATE 75 MG PO TABS
75.0000 mg | ORAL_TABLET | Freq: Every day | ORAL | Status: DC
  Administered 2020-12-07: 75 mg via ORAL
  Filled 2020-12-07: qty 1

## 2020-12-07 MED ORDER — GUAIFENESIN-DM 100-10 MG/5ML PO SYRP: 15.0000 mL | ORAL_SOLUTION | ORAL | Status: DC | PRN

## 2020-12-07 MED ORDER — HYDRALAZINE HCL 20 MG/ML IJ SOLN: 5.0000 mg | INTRAMUSCULAR | Status: DC | PRN

## 2020-12-07 MED ORDER — METOPROLOL TARTRATE 5 MG/5ML IV SOLN: 2.0000 mg | INTRAVENOUS | Status: DC | PRN

## 2020-12-07 MED ORDER — MORPHINE SULFATE (PF) 4 MG/ML IV SOLN: 4.0000 mg | INTRAVENOUS | Status: DC | PRN

## 2020-12-07 MED ORDER — ACETAMINOPHEN 325 MG PO TABS
650.0000 mg | ORAL_TABLET | Freq: Four times a day (QID) | ORAL | Status: DC
  Administered 2020-12-07 – 2020-12-10 (×14): 650 mg via ORAL
  Filled 2020-12-07 (×14): qty 2

## 2020-12-07 MED ORDER — IPRATROPIUM-ALBUTEROL 0.5-2.5 (3) MG/3ML IN SOLN: 3.0000 mL | RESPIRATORY_TRACT | Status: DC | PRN

## 2020-12-07 MED ORDER — ALBUTEROL SULFATE HFA 108 (90 BASE) MCG/ACT IN AERS: 2.0000 | INHALATION_SPRAY | Freq: Four times a day (QID) | RESPIRATORY_TRACT | Status: DC | PRN

## 2020-12-07 NOTE — Anesthesia Preprocedure Evaluation (Addendum)
Anesthesia Evaluation  Patient identified by MRN, date of birth, ID band Patient awake    Reviewed: Allergy & Precautions, NPO status , Patient's Chart, lab work & pertinent test results  History of Anesthesia Complications Negative for: history of anesthetic complications  Airway Mallampati: II  TM Distance: >3 FB Neck ROM: Full    Dental  (+) Edentulous Upper, Edentulous Lower   Pulmonary COPD,  oxygen dependent, Patient abstained from smoking., former smoker,    Pulmonary exam normal        Cardiovascular hypertension, + Peripheral Vascular Disease  Normal cardiovascular exam     Neuro/Psych Anxiety negative neurological ROS     GI/Hepatic negative GI ROS, (+)     Substance abuse: chronic oxycodone 15 mg use.  ,   Endo/Other  negative endocrine ROS  Renal/GU negative Renal ROS  negative genitourinary   Musculoskeletal negative musculoskeletal ROS (+) narcotic dependent  Abdominal   Peds  Hematology Plavix   Anesthesia Other Findings  Stress test 2014: Overall Impression: Low risk stress nuclear study with fixed septal and apical lateral defects. There is reverse redistribution and poor radiotracer uptake. No obvious reversible ischemia, but clinical correlation is advised. LV Wall Motion:  NL LV Function; NL Wall Motion; EF 74%   Reproductive/Obstetrics                            Anesthesia Physical Anesthesia Plan  ASA: III  Anesthesia Plan: General   Post-op Pain Management:    Induction: Intravenous  PONV Risk Score and Plan: 2 and Ondansetron, Dexamethasone, Treatment may vary due to age or medical condition and Midazolam  Airway Management Planned: Oral ETT  Additional Equipment: None  Intra-op Plan:   Post-operative Plan: Extubation in OR  Informed Consent: I have reviewed the patients History and Physical, chart, labs and discussed the procedure including the  risks, benefits and alternatives for the proposed anesthesia with the patient or authorized representative who has indicated his/her understanding and acceptance.     Dental advisory given  Plan Discussed with:   Anesthesia Plan Comments:        Anesthesia Quick Evaluation

## 2020-12-07 NOTE — Progress Notes (Signed)
    Subjective  -   Left foot feels much better   Physical Exam:  Non-palpable left pedal pulses Motor and sensory functionintact       Assessment/Plan:    Left leg rest pain:  CTA shows new common femoral occlusion which is the likely source of his rest pain, which has improved with IV heparin.  I discussed proceeding with left common femoral endarterectomy tomorrow.  He will be NPO after midnight  Christopher Pena 12/07/2020 10:35 AM --  Vitals:   12/07/20 0324 12/07/20 0740  BP: 122/74 140/82  Pulse: 72   Resp: 20 18  Temp: 97.6 F (36.4 C) 97.7 F (36.5 C)  SpO2: 98%     Intake/Output Summary (Last 24 hours) at 12/07/2020 1035 Last data filed at 12/07/2020 0500 Gross per 24 hour  Intake 113 ml  Output 300 ml  Net -187 ml     Laboratory CBC    Component Value Date/Time   WBC 11.7 (H) 12/07/2020 0653   HGB 13.7 12/07/2020 0653   HCT 41.7 12/07/2020 0653   PLT 297 12/07/2020 0653    BMET    Component Value Date/Time   NA 138 12/07/2020 0653   K 4.2 12/07/2020 0653   CL 101 12/07/2020 0653   CO2 29 12/07/2020 0653   GLUCOSE 109 (H) 12/07/2020 0653   BUN 10 12/07/2020 0653   CREATININE 1.03 12/07/2020 0653   CREATININE 0.69 05/11/2014 1533   CALCIUM 8.9 12/07/2020 0653   GFRNONAA >60 12/07/2020 0653   GFRNONAA >89 04/19/2014 1418   GFRAA >60 12/24/2018 0224   GFRAA >89 04/19/2014 1418    COAG Lab Results  Component Value Date   INR 1.1 12/07/2020   INR 1.0 12/06/2020   INR 1.02 09/20/2016   No results found for: PTT  Antibiotics Anti-infectives (From admission, onward)   None       V. Christopher Kirstyn Lean IV, M.D., FACS [facility] [place] Office: [phone] Pager:  [phone] 

## 2020-12-07 NOTE — H&P (View-Only) (Signed)
    Subjective  -   Left foot feels much better   Physical Exam:  Non-palpable left pedal pulses Motor and sensory functionintact       Assessment/Plan:    Left leg rest pain:  CTA shows new common femoral occlusion which is the likely source of his rest pain, which has improved with IV heparin.  I discussed proceeding with left common femoral endarterectomy tomorrow.  He will be NPO after midnight  Wells Adyan Palau 12/07/2020 10:35 AM --  Vitals:   12/07/20 0324 12/07/20 0740  BP: 122/74 140/82  Pulse: 72   Resp: 20 18  Temp: 97.6 F (36.4 C) 97.7 F (36.5 C)  SpO2: 98%     Intake/Output Summary (Last 24 hours) at 12/07/2020 1035 Last data filed at 12/07/2020 0500 Gross per 24 hour  Intake 113 ml  Output 300 ml  Net -187 ml     Laboratory CBC    Component Value Date/Time   WBC 11.7 (H) 12/07/2020 0653   HGB 13.7 12/07/2020 0653   HCT 41.7 12/07/2020 0653   PLT 297 12/07/2020 0653    BMET    Component Value Date/Time   NA 138 12/07/2020 0653   K 4.2 12/07/2020 0653   CL 101 12/07/2020 0653   CO2 29 12/07/2020 0653   GLUCOSE 109 (H) 12/07/2020 0653   BUN 10 12/07/2020 0653   CREATININE 1.03 12/07/2020 0653   CREATININE 0.69 05/11/2014 1533   CALCIUM 8.9 12/07/2020 0653   GFRNONAA >60 12/07/2020 0653   GFRNONAA >89 04/19/2014 1418   GFRAA >60 12/24/2018 0224   GFRAA >89 04/19/2014 1418    COAG Lab Results  Component Value Date   INR 1.1 12/07/2020   INR 1.0 12/06/2020   INR 1.02 09/20/2016   No results found for: PTT  Antibiotics Anti-infectives (From admission, onward)   None       V. Charlena Cross, M.D., [facility] [place] Office: [phone] Pager:  [phone]

## 2020-12-07 NOTE — Progress Notes (Addendum)
ANTICOAGULATION CONSULT NOTE - Follow Up Consult  Pharmacy Consult for IV Heparin Indication: left lower extremity ischemia  Allergies  Allergen Reactions  . Clindamycin/Lincomycin Diarrhea    Patient Measurements: Height: 67 inches IBW: 66.1 kg Updated weight pending: Heparin Dosing Weight: 75-80 kg  Vital Signs: Temp: 97.7 F (36.5 C) (04/21 0740) Temp Source: Oral (04/21 0740) BP: 140/82 (04/21 0740)  Labs: Recent Labs    12/06/20 1858 12/06/20 1920 12/07/20 0653 12/07/20 1755  HGB 15.1 16.3 13.7  --   HCT 46.0 48.0 41.7  --   PLT 379  --  297  --   APTT 32  --   --   --   LABPROT 13.3  --  14.5  --   INR 1.0  --  1.1  --   HEPARINUNFRC  --   --  0.74* 0.33  CREATININE 1.17 1.00 1.03  --     CrCl cannot be calculated (Unknown ideal weight.).  Assessment: 74 yr old man presented 12/06/20 with left leg pain; has hx of peripheral bypass and occlusion. Pharmacy was consulted for heparin infusion for ischemic leg. Pt was not on anticoagulation PTA.  Heparin level ~7 hrs after heparin infusion was decreased to 1150 units/hr was 0.33 units/ml, which is within the goal range for this pt. H/H 13.7/41.7, plt 297. Per RN, no issues with IV or bleeding observed.  Pt is scheduled for procedure (common femoral endarterectomy) tomorrow with Dr. Myra Gianotti.  Goal of Therapy:  Heparin level 0.3-0.7 units/ml Monitor platelets by anticoagulation protocol: Yes   Plan:  Continue heparin infusion at 1150 units/hr Check confirmatory heparin level in 8 hrs Monitor daily heparin level, CBC Monitor for bleeding F/U current wt F/U after procedure tomorrow  Vicki Mallet, PharmD, BCPS, [facility] Clinical Pharmacist 12/07/2020,7:16 PM

## 2020-12-07 NOTE — Progress Notes (Signed)
ANTICOAGULATION CONSULT NOTE - Follow Up Consult  Pharmacy Consult for Heparin Indication: left lower extremity ischemia  Allergies  Allergen Reactions  . Clindamycin/Lincomycin Diarrhea    Patient Measurements: Height: 67 inches IBW: 66.1 kg Updated weight pending: Heparin Dosing Weight: 75-80 kg  Vital Signs: Temp: 97.7 F (36.5 C) (04/21 0740) Temp Source: Oral (04/21 0740) BP: 140/82 (04/21 0740) Pulse Rate: 72 (04/21 0324)  Labs: Recent Labs    12/06/20 1858 12/06/20 1920 12/07/20 0653  HGB 15.1 16.3 13.7  HCT 46.0 48.0 41.7  PLT 379  --  297  APTT 32  --   --   LABPROT 13.3  --  14.5  INR 1.0  --  1.1  HEPARINUNFRC  --   --  0.74*  CREATININE 1.17 1.00 1.03    CrCl cannot be calculated (Unknown ideal weight.).  Assessment: 62 YOM presented 12/06/20 with left leg pain. Hx peripheral bypass and occlusion. Pharmacy consulted for heparin drip for ischemic leg.  He is not on anticoagulation PTA.   Initial heparin level is just above target range (0.74) on 1250 units/hr.  Goal of Therapy:  Heparin level 0.3-0.7 units/ml Monitor platelets by anticoagulation protocol: Yes   Plan:  Decrease heparin drip to 11050 units/hr NPO for procedure, but awaiting details and timing. Heparin level ~8 hrs after rate change, or f/u post-procedure. Daily heparin level and CBC. Follow up current weight.  Dennie Fetters, RPh 12/07/2020,9:34 AM

## 2020-12-07 NOTE — Progress Notes (Signed)
Patient stated his left leg has become "numb" again. Left leg cool to touch, with no pulses present via doppler. Notified MD.  Kenard Gower, RN

## 2020-12-08 ENCOUNTER — Encounter (HOSPITAL_COMMUNITY): Payer: Self-pay | Admitting: Vascular Surgery

## 2020-12-08 ENCOUNTER — Inpatient Hospital Stay (HOSPITAL_COMMUNITY): Payer: Medicare Other | Admitting: Anesthesiology

## 2020-12-08 ENCOUNTER — Encounter (HOSPITAL_COMMUNITY)
Admission: EM | Disposition: A | Discharge status: Home / Self Care | Payer: Self-pay | Source: Home / Self Care | Attending: Vascular Surgery

## 2020-12-08 DIAGNOSIS — I70209 Unspecified atherosclerosis of native arteries of extremities, unspecified extremity: Secondary | ICD-10-CM

## 2020-12-08 HISTORY — PX: ENDARTERECTOMY FEMORAL: SHX5804

## 2020-12-08 HISTORY — PX: PATCH ANGIOPLASTY: SHX6230

## 2020-12-08 LAB — CBC
HCT: 39.3 % (ref 39.0–52.0)
Hemoglobin: 12.8 g/dL — ABNORMAL LOW (ref 13.0–17.0)
MCH: 30.7 pg (ref 26.0–34.0)
MCHC: 32.6 g/dL (ref 30.0–36.0)
MCV: 94.2 fL (ref 80.0–100.0)
Platelets: 256 10*3/uL (ref 150–400)
RBC: 4.17 MIL/uL — ABNORMAL LOW (ref 4.22–5.81)
RDW: 14.8 % (ref 11.5–15.5)
WBC: 8.7 10*3/uL (ref 4.0–10.5)
nRBC: 0 % (ref 0.0–0.2)

## 2020-12-08 LAB — HEPARIN LEVEL (UNFRACTIONATED): Heparin Unfractionated: 0.66 IU/mL (ref 0.30–0.70)

## 2020-12-08 SURGERY — ENDARTERECTOMY, FEMORAL
Anesthesia: General | Site: Groin | Laterality: Left

## 2020-12-08 MED ORDER — OXYCODONE HCL 5 MG PO TABS: 5.0000 mg | ORAL_TABLET | ORAL | Status: DC | PRN

## 2020-12-08 MED ORDER — FENTANYL CITRATE (PF) 250 MCG/5ML IJ SOLN
INTRAMUSCULAR | Status: AC
  Filled 2020-12-08: qty 5

## 2020-12-08 MED ORDER — SODIUM CHLORIDE 0.9 % IV SOLN: INTRAVENOUS | Status: DC

## 2020-12-08 MED ORDER — 0.9 % SODIUM CHLORIDE (POUR BTL) OPTIME
TOPICAL | Status: DC | PRN
  Administered 2020-12-08: 2000 mL

## 2020-12-08 MED ORDER — SODIUM CHLORIDE 0.9 % IV SOLN: 500.0000 mL | Freq: Once | INTRAVENOUS | Status: DC | PRN

## 2020-12-08 MED ORDER — LIDOCAINE 2% (20 MG/ML) 5 ML SYRINGE
INTRAMUSCULAR | Status: AC
  Filled 2020-12-08: qty 5

## 2020-12-08 MED ORDER — SODIUM CHLORIDE 0.9 % IV SOLN
INTRAVENOUS | Status: AC
  Filled 2020-12-08: qty 1.2

## 2020-12-08 MED ORDER — PANTOPRAZOLE SODIUM 40 MG PO TBEC
40.0000 mg | DELAYED_RELEASE_TABLET | Freq: Every day | ORAL | Status: DC
  Administered 2020-12-08 – 2020-12-10 (×3): 40 mg via ORAL
  Filled 2020-12-08 (×3): qty 1

## 2020-12-08 MED ORDER — CEFAZOLIN SODIUM-DEXTROSE 2-3 GM-%(50ML) IV SOLR
INTRAVENOUS | Status: DC | PRN
  Administered 2020-12-08: 2 g via INTRAVENOUS

## 2020-12-08 MED ORDER — FENTANYL CITRATE (PF) 100 MCG/2ML IJ SOLN
25.0000 ug | INTRAMUSCULAR | Status: DC | PRN
  Administered 2020-12-08 (×2): 50 ug via INTRAVENOUS

## 2020-12-08 MED ORDER — FENTANYL CITRATE (PF) 100 MCG/2ML IJ SOLN
INTRAMUSCULAR | Status: AC
  Filled 2020-12-08: qty 2

## 2020-12-08 MED ORDER — HEMOSTATIC AGENTS (NO CHARGE) OPTIME
TOPICAL | Status: DC | PRN
  Administered 2020-12-08: 1 via TOPICAL

## 2020-12-08 MED ORDER — ALUM & MAG HYDROXIDE-SIMETH 200-200-20 MG/5ML PO SUSP: 15.0000 mL | ORAL | Status: DC | PRN

## 2020-12-08 MED ORDER — ROCURONIUM BROMIDE 10 MG/ML (PF) SYRINGE
PREFILLED_SYRINGE | INTRAVENOUS | Status: DC | PRN
  Administered 2020-12-08: 50 mg via INTRAVENOUS
  Administered 2020-12-08: 20 mg via INTRAVENOUS
  Administered 2020-12-08: 10 mg via INTRAVENOUS
  Administered 2020-12-08: 20 mg via INTRAVENOUS

## 2020-12-08 MED ORDER — LIDOCAINE 2% (20 MG/ML) 5 ML SYRINGE
INTRAMUSCULAR | Status: DC | PRN
  Administered 2020-12-08: 100 mg via INTRAVENOUS

## 2020-12-08 MED ORDER — SODIUM CHLORIDE 0.9 % IV SOLN: INTRAVENOUS | Status: DC | PRN

## 2020-12-08 MED ORDER — GUAIFENESIN-DM 100-10 MG/5ML PO SYRP: 15.0000 mL | ORAL_SOLUTION | ORAL | Status: DC | PRN

## 2020-12-08 MED ORDER — METOPROLOL TARTRATE 5 MG/5ML IV SOLN: 2.0000 mg | INTRAVENOUS | Status: DC | PRN

## 2020-12-08 MED ORDER — LACTATED RINGERS IV SOLN: INTRAVENOUS | Status: DC | PRN

## 2020-12-08 MED ORDER — MIDAZOLAM HCL 2 MG/2ML IJ SOLN
INTRAMUSCULAR | Status: AC
  Filled 2020-12-08: qty 2

## 2020-12-08 MED ORDER — AMISULPRIDE (ANTIEMETIC) 5 MG/2ML IV SOLN
10.0000 mg | Freq: Once | INTRAVENOUS | Status: DC | PRN
Start: 2020-12-08 — End: 2020-12-08

## 2020-12-08 MED ORDER — EPHEDRINE SULFATE-NACL 50-0.9 MG/10ML-% IV SOSY
PREFILLED_SYRINGE | INTRAVENOUS | Status: DC | PRN
  Administered 2020-12-08: 10 mg via INTRAVENOUS

## 2020-12-08 MED ORDER — HYDRALAZINE HCL 20 MG/ML IJ SOLN: 5.0000 mg | INTRAMUSCULAR | Status: DC | PRN

## 2020-12-08 MED ORDER — PROTAMINE SULFATE 10 MG/ML IV SOLN
INTRAVENOUS | Status: AC
  Filled 2020-12-08: qty 5

## 2020-12-08 MED ORDER — ONDANSETRON HCL 4 MG/2ML IJ SOLN
INTRAMUSCULAR | Status: DC | PRN
  Administered 2020-12-08: 4 mg via INTRAVENOUS

## 2020-12-08 MED ORDER — DEXAMETHASONE SODIUM PHOSPHATE 10 MG/ML IJ SOLN
INTRAMUSCULAR | Status: DC | PRN
  Administered 2020-12-08: 5 mg via INTRAVENOUS

## 2020-12-08 MED ORDER — PHENOL 1.4 % MT LIQD: 1.0000 | OROMUCOSAL | Status: DC | PRN

## 2020-12-08 MED ORDER — PHENYLEPHRINE HCL-NACL 10-0.9 MG/250ML-% IV SOLN
INTRAVENOUS | Status: DC | PRN
  Administered 2020-12-08: 25 ug/min via INTRAVENOUS

## 2020-12-08 MED ORDER — OXYCODONE HCL 5 MG/5ML PO SOLN: 5.0000 mg | Freq: Once | ORAL | Status: DC | PRN

## 2020-12-08 MED ORDER — ONDANSETRON HCL 4 MG/2ML IJ SOLN: 4.0000 mg | Freq: Once | INTRAMUSCULAR | Status: DC | PRN

## 2020-12-08 MED ORDER — ACETAMINOPHEN 650 MG RE SUPP
325.0000 mg | RECTAL | Status: DC | PRN
Start: 2020-12-08 — End: 2020-12-10

## 2020-12-08 MED ORDER — ROCURONIUM BROMIDE 10 MG/ML (PF) SYRINGE
PREFILLED_SYRINGE | INTRAVENOUS | Status: AC
  Filled 2020-12-08: qty 10

## 2020-12-08 MED ORDER — CEFAZOLIN SODIUM-DEXTROSE 2-4 GM/100ML-% IV SOLN
INTRAVENOUS | Status: AC
  Filled 2020-12-08: qty 100

## 2020-12-08 MED ORDER — SUGAMMADEX SODIUM 200 MG/2ML IV SOLN
INTRAVENOUS | Status: DC | PRN
  Administered 2020-12-08: 200 mg via INTRAVENOUS

## 2020-12-08 MED ORDER — ACETAMINOPHEN 325 MG PO TABS: 325.0000 mg | ORAL_TABLET | ORAL | Status: DC | PRN

## 2020-12-08 MED ORDER — MAGNESIUM SULFATE 2 GM/50ML IV SOLN: 2.0000 g | Freq: Every day | INTRAVENOUS | Status: DC | PRN

## 2020-12-08 MED ORDER — HEPARIN SODIUM (PORCINE) 5000 UNIT/ML IJ SOLN
5000.0000 [IU] | Freq: Three times a day (TID) | INTRAMUSCULAR | Status: DC
  Administered 2020-12-09 – 2020-12-10 (×5): 5000 [IU] via SUBCUTANEOUS
  Filled 2020-12-08 (×5): qty 1

## 2020-12-08 MED ORDER — PROTAMINE SULFATE 10 MG/ML IV SOLN
INTRAVENOUS | Status: DC | PRN
  Administered 2020-12-08: 50 mg via INTRAVENOUS

## 2020-12-08 MED ORDER — CLOPIDOGREL BISULFATE 75 MG PO TABS
75.0000 mg | ORAL_TABLET | Freq: Every day | ORAL | Status: DC
  Administered 2020-12-09 – 2020-12-10 (×2): 75 mg via ORAL
  Filled 2020-12-08 (×3): qty 1

## 2020-12-08 MED ORDER — DEXAMETHASONE SODIUM PHOSPHATE 10 MG/ML IJ SOLN
INTRAMUSCULAR | Status: AC
  Filled 2020-12-08: qty 1

## 2020-12-08 MED ORDER — ONDANSETRON HCL 4 MG/2ML IJ SOLN: 4.0000 mg | Freq: Four times a day (QID) | INTRAMUSCULAR | Status: DC | PRN

## 2020-12-08 MED ORDER — CEFAZOLIN SODIUM-DEXTROSE 2-4 GM/100ML-% IV SOLN
2.0000 g | Freq: Three times a day (TID) | INTRAVENOUS | Status: AC
  Administered 2020-12-08 – 2020-12-09 (×2): 2 g via INTRAVENOUS
  Filled 2020-12-08 (×2): qty 100

## 2020-12-08 MED ORDER — ONDANSETRON HCL 4 MG/2ML IJ SOLN
INTRAMUSCULAR | Status: AC
  Filled 2020-12-08: qty 2

## 2020-12-08 MED ORDER — ROSUVASTATIN CALCIUM 5 MG PO TABS
10.0000 mg | ORAL_TABLET | Freq: Every day | ORAL | Status: DC
  Administered 2020-12-08: 10 mg via ORAL
  Filled 2020-12-08: qty 2

## 2020-12-08 MED ORDER — EPHEDRINE 5 MG/ML INJ
INTRAVENOUS | Status: AC
  Filled 2020-12-08: qty 10

## 2020-12-08 MED ORDER — OXYCODONE HCL 5 MG PO TABS: 5.0000 mg | ORAL_TABLET | Freq: Once | ORAL | Status: DC | PRN

## 2020-12-08 MED ORDER — DOCUSATE SODIUM 100 MG PO CAPS
100.0000 mg | ORAL_CAPSULE | Freq: Every day | ORAL | Status: DC
  Filled 2020-12-08: qty 1

## 2020-12-08 MED ORDER — LABETALOL HCL 5 MG/ML IV SOLN: 10.0000 mg | INTRAVENOUS | Status: DC | PRN

## 2020-12-08 MED ORDER — PROPOFOL 10 MG/ML IV BOLUS
INTRAVENOUS | Status: AC
  Filled 2020-12-08: qty 40

## 2020-12-08 MED ORDER — PROPOFOL 10 MG/ML IV BOLUS
INTRAVENOUS | Status: DC | PRN
  Administered 2020-12-08: 140 mg via INTRAVENOUS

## 2020-12-08 MED ORDER — ASPIRIN EC 81 MG PO TBEC
81.0000 mg | DELAYED_RELEASE_TABLET | Freq: Every day | ORAL | Status: DC
  Administered 2020-12-09 – 2020-12-10 (×2): 81 mg via ORAL
  Filled 2020-12-08 (×2): qty 1

## 2020-12-08 MED ORDER — FENTANYL CITRATE (PF) 250 MCG/5ML IJ SOLN
INTRAMUSCULAR | Status: DC | PRN
  Administered 2020-12-08 (×2): 50 ug via INTRAVENOUS
  Administered 2020-12-08: 100 ug via INTRAVENOUS
  Administered 2020-12-08: 50 ug via INTRAVENOUS

## 2020-12-08 MED ORDER — HEPARIN SODIUM (PORCINE) 1000 UNIT/ML IJ SOLN
INTRAMUSCULAR | Status: DC | PRN
  Administered 2020-12-08: 8000 [IU] via INTRAVENOUS

## 2020-12-08 MED ORDER — POLYETHYLENE GLYCOL 3350 17 G PO PACK: 17.0000 g | PACK | Freq: Every day | ORAL | Status: DC | PRN

## 2020-12-08 MED ORDER — MORPHINE SULFATE (PF) 2 MG/ML IV SOLN: 2.0000 mg | INTRAVENOUS | Status: DC | PRN

## 2020-12-08 MED ORDER — ROSUVASTATIN CALCIUM 20 MG PO TABS
20.0000 mg | ORAL_TABLET | Freq: Every day | ORAL | Status: DC
  Administered 2020-12-09 – 2020-12-10 (×2): 20 mg via ORAL
  Filled 2020-12-08 (×2): qty 1

## 2020-12-08 MED ORDER — POTASSIUM CHLORIDE CRYS ER 20 MEQ PO TBCR: 20.0000 meq | EXTENDED_RELEASE_TABLET | Freq: Every day | ORAL | Status: DC | PRN

## 2020-12-08 SURGICAL SUPPLY — 57 items
ADH SKN CLS APL DERMABOND .7 (GAUZE/BANDAGES/DRESSINGS) ×1
AGENT HMST KT MTR STRL THRMB (HEMOSTASIS) ×1
CANISTER SUCT 3000ML PPV (MISCELLANEOUS) ×2 IMPLANT
CATH CXI 4F 150 DAV (CATHETERS) ×1 IMPLANT
CLIP VESOCCLUDE MED 24/CT (CLIP) ×2 IMPLANT
CLIP VESOCCLUDE SM WIDE 24/CT (CLIP) ×2 IMPLANT
COVER PROBE W GEL 5X96 (DRAPES) ×1 IMPLANT
COVER WAND RF STERILE (DRAPES) ×2 IMPLANT
DERMABOND ADVANCED (GAUZE/BANDAGES/DRESSINGS) ×1
DERMABOND ADVANCED .7 DNX12 (GAUZE/BANDAGES/DRESSINGS) ×1 IMPLANT
DRAIN CHANNEL 15F RND FF W/TCR (WOUND CARE) IMPLANT
DRAPE X-RAY CASS 24X20 (DRAPES) IMPLANT
DRESSING PEEL AND PLC PRVNA 13 (GAUZE/BANDAGES/DRESSINGS) IMPLANT
DRESSING PREVENA PLUS CUSTOM (GAUZE/BANDAGES/DRESSINGS) IMPLANT
DRSG PEEL AND PLACE PREVENA 13 (GAUZE/BANDAGES/DRESSINGS) ×2
DRSG PREVENA PLUS CUSTOM (GAUZE/BANDAGES/DRESSINGS) ×2
ELECT REM PT RETURN 9FT ADLT (ELECTROSURGICAL) ×2
ELECTRODE REM PT RTRN 9FT ADLT (ELECTROSURGICAL) ×1 IMPLANT
EVACUATOR SILICONE 100CC (DRAIN) IMPLANT
GLOVE BIOGEL PI IND STRL 7.5 (GLOVE) ×1 IMPLANT
GLOVE BIOGEL PI INDICATOR 7.5 (GLOVE)
GLOVE SURG POLYISO LF SZ6 (GLOVE) ×1 IMPLANT
GLOVE SURG SS PI 7.5 STRL IVOR (GLOVE) ×1 IMPLANT
GLOVE SURG UNDER POLY LF SZ6.5 (GLOVE) ×5 IMPLANT
GLOVE SURG UNDER POLY LF SZ7 (GLOVE) ×1 IMPLANT
GOWN STRL REUS W/ TWL LRG LVL3 (GOWN DISPOSABLE) ×2 IMPLANT
GOWN STRL REUS W/ TWL XL LVL3 (GOWN DISPOSABLE) ×1 IMPLANT
GOWN STRL REUS W/TWL LRG LVL3 (GOWN DISPOSABLE) ×8
GOWN STRL REUS W/TWL XL LVL3 (GOWN DISPOSABLE) ×2
HEMOSTAT SNOW SURGICEL 2X4 (HEMOSTASIS) ×1 IMPLANT
KIT BASIN OR (CUSTOM PROCEDURE TRAY) ×2 IMPLANT
KIT DRSG PREVENA PLUS 7DAY 125 (MISCELLANEOUS) ×1 IMPLANT
KIT TURNOVER KIT B (KITS) ×2 IMPLANT
NS IRRIG 1000ML POUR BTL (IV SOLUTION) ×4 IMPLANT
PACK PERIPHERAL VASCULAR (CUSTOM PROCEDURE TRAY) ×2 IMPLANT
PAD ARMBOARD 7.5X6 YLW CONV (MISCELLANEOUS) ×4 IMPLANT
PATCH VASC XENOSURE 1CMX6CM (Vascular Products) ×2 IMPLANT
PATCH VASC XENOSURE 1X6 (Vascular Products) IMPLANT
SET COLLECT BLD 21X3/4 12 (NEEDLE) IMPLANT
SET WALTER ACTIVATION W/DRAPE (SET/KITS/TRAYS/PACK) ×1 IMPLANT
SLEEVE SURGEON STRL (DRAPES) ×1 IMPLANT
SPONGE INTESTINAL PEANUT (DISPOSABLE) ×2 IMPLANT
STOPCOCK 4 WAY LG BORE MALE ST (IV SETS) ×1 IMPLANT
SURGIFLO W/THROMBIN 8M KIT (HEMOSTASIS) ×1 IMPLANT
SUT ETHILON 3 0 PS 1 (SUTURE) IMPLANT
SUT PROLENE 5 0 C 1 24 (SUTURE) ×5 IMPLANT
SUT PROLENE 6 0 BV (SUTURE) ×2 IMPLANT
SUT VIC AB 2-0 CT1 27 (SUTURE) ×2
SUT VIC AB 2-0 CT1 TAPERPNT 27 (SUTURE) ×1 IMPLANT
SUT VIC AB 3-0 SH 27 (SUTURE) ×2
SUT VIC AB 3-0 SH 27X BRD (SUTURE) ×1 IMPLANT
SUT VIC AB 3-0 X1 27 (SUTURE) ×2 IMPLANT
SYR 3ML LL SCALE MARK (SYRINGE) ×1 IMPLANT
TOWEL GREEN STERILE (TOWEL DISPOSABLE) ×2 IMPLANT
TUBING EXTENTION W/L.L. (IV SETS) IMPLANT
UNDERPAD 30X36 HEAVY ABSORB (UNDERPADS AND DIAPERS) ×2 IMPLANT
WATER STERILE IRR 1000ML POUR (IV SOLUTION) ×2 IMPLANT

## 2020-12-08 NOTE — Transfer of Care (Signed)
Immediate Anesthesia Transfer of Care Note  Patient: Christopher Pena  Procedure(s) Performed: REDO LEFT FEMORAL ARTERY ENDARTERECTOMY, LEFT FEMORAL ARTERY THROMBECTOMY (Left Groin) WITH PATCH ANGIOPLASTY USING 1 X 6 CM XENOSURE BIOLOGIC PATCH (Left Groin)  Patient Location: PACU  Anesthesia Type:General  Level of Consciousness: awake, alert , oriented and patient cooperative  Airway & Oxygen Therapy: Patient Spontanous Breathing and Patient connected to nasal cannula oxygen  Post-op Assessment: Report given to RN, Post -op Vital signs reviewed and stable and Patient moving all extremities X 4  Post vital signs: Reviewed and stable  Last Vitals:  Vitals Value Taken Time  BP 114/76 12/08/20 1116  Temp 36.6 C 12/08/20 1115  Pulse 101 12/08/20 1118  Resp 12 12/08/20 1118  SpO2 95 % 12/08/20 1118  Vitals shown include unvalidated device data.  Last Pain:  Vitals:   12/08/20 0519  TempSrc:   PainSc: 3          Complications: No complications documented.

## 2020-12-08 NOTE — Care Management Important Message (Signed)
Important Message  Patient Details  Name: ANIL HAVARD MRN: [id number] Date of Birth: [birth year]   Medicare Important Message Given:  Yes     Rhonin Trott 12/08/2020, 11:01 AM

## 2020-12-08 NOTE — Op Note (Signed)
Patient name: Christopher Pena MRN: [id number] DOB: April 10, 1946 Sex: male  12/08/2020 Pre-operative Diagnosis: Ischemic left leg Post-operative diagnosis:  Same Surgeon:  Durene Cal Assistants:  Tilden Fossa, RNFA Procedure:   #1: Redo left femoral exposure   #2: Left iliofemoral endarterectomy with bovine pericardial patch angioplasty   #3: Prevena wound VAC   #4: Left femoral thrombectomy Anesthesia: General Blood Loss: 200 cc Specimens: None  Findings: Occlusive thrombus within the common femoral artery.  I removed the prior Gore-Tex graft off of the common femoral artery and performed a thrombectomy as well as endarterectomy of the common femoral artery and the distal external iliac artery.  A 6 cm bovine pericardial patch was placed on the common femoral artery.  There was dense scar tissue surrounding the artery which was sharply dissected free.  There remains a crossing circumflex iliac vein which is in close proximity to the proximal portion of the patch.  I isolated 2 main profunda branches.  One was medial to the common femoral artery and the other was lateral.  Indications: This is a 75 year old gentleman who has previously undergone a vein femoral tibial bypass and a Gore-Tex femoral-tibial bypass.  His bypass graft is chronic occluded.  He developed rest pain in his left leg and a CT scan showed common femoral occlusion.  He comes in today for revascularization.  Procedure:  The patient was identified in the holding area and taken to [facility] OR ROOM 12  The patient was then placed supine on the table. general anesthesia was administered.  The patient was prepped and draped in the usual sterile fashion.  A time out was called and antibiotics were administered.  The patient's previous longitudinal incision was opened with a 10 blade.  Cautery was used divide subcutaneous tissue.  I dissected out the common femoral artery up to the inguinal ligament as well as the superficial femoral  and to primary profunda branches.  1 profunda branch was dissected out medial to the common femoral artery the other was lateral.  Dissection was done with cautery, scissors, and a 15 blade.  There was dense scar tissue around the artery.  The Walter retractor was used to help exposure up under the inguinal ligament.  Ultimately I was able to encircle both profunda branches, the superficial femoral artery, and the distal external iliac artery.  Once I had adequate exposure, the patient was fully heparinized.  After the heparin circulated, the previous femoral-tibial Gore-Tex graft was transected.  I then removed the graft from the common femoral artery.  There was fresh thrombus within the common femoral artery.  This was removed with a elevator.  There did appear to be a proximal stenosis in the proximal common femoral artery.  I performed endarterectomy of this area.  In order to get adequate visualization, a Fogarty balloon was inflated in the distal external iliac artery.  There was good backbleeding from both profunda branches.  Once all of the thrombus and plaque was removed, I selected a bovine pericardial patch and performed patch angioplasty using a running 5-0 Prolene.  The entire 6 cm bovine patch was utilized.  Prior to completion the appropriate flushing maneuvers were performed and the anastomosis was completed.  The clamps were released.  2 repair stitches were required.  The patient had a palpable pulse in the common femoral artery and brisk Doppler signals in both profunda branches.  Next, the heparin was reversed with 50 mg of protamine.  Hemostasis was achieved.  The  wound was then irrigated.  I then reapproximated the femoral sheath with 2-0 Vicryl.  The subcutaneous tissue was then closed with additional layers of 3-0 Vicryl followed by subcuticular closure.  A Prevena wound VAC was then placed.  There were no immediate complications.  The patient was successfully extubated and taken to recovery  in stable condition.   Disposition: To PACU stable.   Juleen China, M.D., [facility] [place] Office: [phone] Pager:  [phone]

## 2020-12-08 NOTE — Anesthesia Postprocedure Evaluation (Signed)
Anesthesia Post Note  Patient: Archibald Marchetta Hyde  Procedure(s) Performed: REDO LEFT FEMORAL ARTERY ENDARTERECTOMY, LEFT FEMORAL ARTERY THROMBECTOMY (Left Groin) WITH PATCH ANGIOPLASTY USING 1 X 6 CM XENOSURE BIOLOGIC PATCH (Left Groin)     Patient location during evaluation: PACU Anesthesia Type: General Level of consciousness: awake and alert Pain management: pain level controlled Vital Signs Assessment: post-procedure vital signs reviewed and stable Respiratory status: spontaneous breathing, nonlabored ventilation and respiratory function stable Cardiovascular status: blood pressure returned to baseline and stable Postop Assessment: no apparent nausea or vomiting Anesthetic complications: no   No complications documented.  Last Vitals:  Vitals:   12/08/20 1152 12/08/20 1210  BP: 118/66 123/72  Pulse: 96 91  Resp: 16 16  Temp: 36.6 C 36.8 C  SpO2: 91% 96%    Last Pain:  Vitals:   12/08/20 1242  TempSrc:   PainSc: 7                  Lucretia Kern

## 2020-12-08 NOTE — Interval H&P Note (Signed)
History and Physical Interval Note:  12/08/2020 6:09 AM  Christopher Pena  has presented today for surgery, with the diagnosis of PAD with rest pain.  The various methods of treatment have been discussed with the patient and family. After consideration of risks, benefits and other options for treatment, the patient has consented to  Procedure(s): LEFT FEMORAL ARTERY ENDARTERECTOMY (Left) as a surgical intervention.  The patient's history has been reviewed, patient examined, no change in status, stable for surgery.  I have reviewed the patient's chart and labs.  Questions were answered to the patient's satisfaction.     Durene Cal

## 2020-12-08 NOTE — Progress Notes (Addendum)
PHARMACIST LIPID MONITORING   KOLSON CHOVANEC is a 75 y.o. male admitted on 12/06/2020 with PVD.  Pharmacy has been consulted to optimize lipid-lowering therapy with the indication of secondary prevention for clinical ASCVD.  Recent Labs:  Lipid Panel (last 6 months):   No results found for: CHOL, TRIG, HDL, CHOLHDL, VLDL, LDLCALC, LDLDIRECT  Hepatic function panel (last 6 months):   Lab Results  Component Value Date   AST 21 12/07/2020   ALT 12 12/07/2020   ALKPHOS 54 12/07/2020   BILITOT 0.7 12/07/2020    SCr (since admission):   Creatinine clearance cannot be calculated (Unknown ideal weight.)  Current therapy and lipid therapy tolerance Current lipid-lowering therapy: Crestor 10mg  po daily Previous lipid-lowering therapies (if applicable): none noted Documented or reported allergies or intolerances to lipid-lowering therapies (if applicable): none  Assessment:   Patient agrees with changes to lipid-lowering therapy  Plan:    1.Statin intensity (high intensity recommended for all patients regardless of the LDL):  Add or increase statin to high intensity.    2.Add ezetimibe (if any one of the following):   Not indicated at this time.  3.Refer to lipid clinic:   No  4.Follow-up with:  Primary care provider - , MD  5.Follow-up labs after discharge:  Changes in lipid therapy were made. Check a lipid panel in 8-12 weeks then annually.     10-12, PharmD Clinical Pharmacist **Pharmacist phone directory can now be found on [email or web address] ([email or web address]).  Listed under [facility].

## 2020-12-08 NOTE — Anesthesia Procedure Notes (Signed)
Procedure Name: Intubation Date/Time: 12/08/2020 7:46 AM Performed by: Colin Benton, CRNA Pre-anesthesia Checklist: Patient identified, Emergency Drugs available, Suction available and Patient being monitored Patient Re-evaluated:Patient Re-evaluated prior to induction Oxygen Delivery Method: Circle system utilized Preoxygenation: Pre-oxygenation with 100% oxygen Induction Type: IV induction Ventilation: Mask ventilation without difficulty and Oral airway inserted - appropriate to patient size Laryngoscope Size: Mac and 3 Grade View: Grade I Tube type: Oral Tube size: 7.5 mm Number of attempts: 1 Airway Equipment and Method: Stylet and Oral airway Placement Confirmation: ETT inserted through vocal cords under direct vision,  positive ETCO2 and breath sounds checked- equal and bilateral Secured at: 24 cm Tube secured with: Tape Dental Injury: Teeth and Oropharynx as per pre-operative assessment  Comments: Intubation performed by Cathlean Cower paramedic student under supervision of CRNA and Anesthesiologist.

## 2020-12-08 NOTE — Progress Notes (Signed)
Pt readmitted from PACU.  Pt is A&O X4 and neuro intact.  Current status matched that given in report from PACU RN.  L lower extremity is still cold with no palpable or doppler pulses.  Right posterior tibial pulse can be obtained via doppler, while R pedal pulse is absent. MD is aware. All other vitals within normal range.  Pt is having pain at surgical site and pain med giving.

## 2020-12-09 LAB — BASIC METABOLIC PANEL
Anion gap: 7 (ref 5–15)
BUN: 12 mg/dL (ref 8–23)
CO2: 26 mmol/L (ref 22–32)
Calcium: 8.3 mg/dL — ABNORMAL LOW (ref 8.9–10.3)
Chloride: 103 mmol/L (ref 98–111)
Creatinine, Ser: 0.93 mg/dL (ref 0.61–1.24)
GFR, Estimated: >60 mL/min (ref >60)
Glucose, Bld: 142 mg/dL — ABNORMAL HIGH (ref 70–99)
Potassium: 4.2 mmol/L (ref 3.5–5.1)
Sodium: 136 mmol/L (ref 135–145)

## 2020-12-09 LAB — CBC
HCT: 38.1 % — ABNORMAL LOW (ref 39.0–52.0)
Hemoglobin: 12.3 g/dL — ABNORMAL LOW (ref 13.0–17.0)
MCH: 30.8 pg (ref 26.0–34.0)
MCHC: 32.3 g/dL (ref 30.0–36.0)
MCV: 95.5 fL (ref 80.0–100.0)
Platelets: 207 10*3/uL (ref 150–400)
RBC: 3.99 MIL/uL — ABNORMAL LOW (ref 4.22–5.81)
RDW: 14.5 % (ref 11.5–15.5)
WBC: 15.4 10*3/uL — ABNORMAL HIGH (ref 4.0–10.5)
nRBC: 0 % (ref 0.0–0.2)

## 2020-12-09 LAB — LIPID PANEL
Cholesterol: 159 mg/dL (ref 0–200)
HDL: 59 mg/dL (ref >40)
LDL Cholesterol: 81 mg/dL (ref 0–99)
Total CHOL/HDL Ratio: 2.7 RATIO
Triglycerides: 94 mg/dL (ref <150)
VLDL: 19 mg/dL (ref 0–40)

## 2020-12-09 NOTE — Evaluation (Signed)
Physical Therapy Evaluation Patient Details Name: Christopher Pena MRN: [id number] DOB: 1946-08-06 Today's Date: 12/09/2020   History of Present Illness  75 y.o. male presents to [facility] on 12/06/2020 with reports of acute LLE pain and numbness. CTA demonstrates femoral artery occlusion, pt underwent L iliofemoral endarterectomy and angioplasty on 12/08/2020. PMH includes anxiety, low back pain, claudication, COPD, HTN, PAD.  Clinical Impression  Pt reports no pain during session and is agreeable to therapy. Pt does not require physical assistance to perform transfers, but reports not using an AD prior to admission for mobility in the home. Pt ambulates household distances, without physical assistance. Pt denies SOB during session with oxygen sat levels at 1LPM at start of session, but 2LPM with mobility. Pt will benefit from acute PT to address deficits in strength, endurance, activity tolerance, and overall safety for improved mobility.     Follow Up Recommendations No PT follow up    Equipment Recommendations  None recommended by PT    Recommendations for Other Services       Precautions / Restrictions Precautions Precautions: Fall Precaution Comments: wound vac Restrictions Weight Bearing Restrictions: No Other Position/Activity Restrictions: Chronic O2      Mobility  Bed Mobility Overal bed mobility: Independent             General bed mobility comments: Pt in recliner upon arrival.    Transfers Overall transfer level: Modified independent Equipment used: Rolling walker (2 wheeled)                Ambulation/Gait Ambulation/Gait assistance: Supervision Gait Distance (Feet): 80 Feet Assistive device: Rolling walker (2 wheeled) Gait Pattern/deviations: Step-through pattern;Decreased stride length Gait velocity: reduced Gait velocity interpretation: 1.31 - 2.62 ft/sec, indicative of limited community ambulator General Gait Details: Pt denies SOB during gait  activities. Oxygen sat down to 89% with gait activities.  Stairs            Wheelchair Mobility    Modified Rankin (Stroke Patients Only)       Balance Overall balance assessment: Needs assistance Sitting-balance support: Feet supported Sitting balance-Leahy Scale: Good     Standing balance support: Bilateral upper extremity supported;During functional activity;No upper extremity supported Standing balance-Leahy Scale: Fair Standing balance comment: Can remove UE support from walker when standing.                             Pertinent Vitals/Pain Pain Assessment: No/denies pain Pain Score: 2  Pain Location: groin wound Pain Descriptors / Indicators: Sore Pain Intervention(s): Monitored during session    Home Living Family/patient expects to be discharged to:: Private residence Living Arrangements: Alone Available Help at Discharge: Family;Available PRN/intermittently Type of Home: Apartment Home Access: Level entry     Home Layout: One level Home Equipment: Walker - 2 wheels;Other (comment) (rollator) Additional Comments: Pt reports having a concentrator    Prior Function Level of Independence: Independent         Comments: Has RW and rollator but does not usually use them for mobility. Normally ambulating household distance.     Hand Dominance   Dominant Hand: Right    Extremity/Trunk Assessment   Upper Extremity Assessment Upper Extremity Assessment: Defer to OT evaluation    Lower Extremity Assessment Lower Extremity Assessment: Generalized weakness    Cervical / Trunk Assessment Cervical / Trunk Assessment: Normal  Communication   Communication: No difficulties  Cognition Arousal/Alertness: Awake/alert Behavior During Therapy: [facility] for  tasks assessed/performed Overall Cognitive Status: Within Functional Limits for tasks assessed                                        General Comments General comments (skin  integrity, edema, etc.): Pt reports to limit distance ambulated secondary to SOB at baseline. Pt uses up to 3LPM [place] at home when mobilizing to maintain oxygen levels.    Exercises     Assessment/Plan    PT Assessment Patient needs continued PT services  PT Problem List Decreased strength;Decreased mobility       PT Treatment Interventions DME instruction;Gait training;Functional mobility training;Therapeutic activities;Therapeutic exercise;Patient/family education    PT Goals (Current goals can be found in the Care Plan section)  Acute Rehab PT Goals Patient Stated Goal: To return home. PT Goal Formulation: With patient Time For Goal Achievement: 12/23/20 Potential to Achieve Goals: Good    Frequency Min 3X/week   Barriers to discharge        Co-evaluation               AM-PAC PT "6 Clicks" Mobility  Outcome Measure Help needed turning from your back to your side while in a flat bed without using bedrails?: None Help needed moving from lying on your back to sitting on the side of a flat bed without using bedrails?: None Help needed moving to and from a bed to a chair (including a wheelchair)?: None Help needed standing up from a chair using your arms (e.g., wheelchair or bedside chair)?: None Help needed to walk in hospital room?: A Little Help needed climbing 3-5 steps with a railing? : A Little 6 Click Score: 22    End of Session Equipment Utilized During Treatment: Gait belt;Oxygen Activity Tolerance: Patient limited by fatigue;Treatment limited secondary to medical complications (Comment);Patient tolerated treatment well Patient left: in bed;with call bell/phone within reach Nurse Communication: Mobility status PT Visit Diagnosis: Other abnormalities of gait and mobility (R26.89);Muscle weakness (generalized) (M62.81);Difficulty in walking, not elsewhere classified (R26.2)    Time: 0973-5329 PT Time Calculation (min) (ACUTE ONLY): 33 min   Charges:   PT  Evaluation $PT Eval Low Complexity: 1 Low          Acute Rehab  Pager: [phone]   Waldemar Dickens, SPT  12/09/2020, 1:56 PM

## 2020-12-09 NOTE — Progress Notes (Addendum)
  Progress Note    12/09/2020 7:35 AM 1 Day Post-Op  Subjective:  Denies pain in his left foot.  Says he hasn't been out of bed  Afebrile HR 50's-80's NSR 90's-110's systolic 98% 2LO2NC  Vitals:   12/09/20 0311 12/09/20 0600  BP:  116/61  Pulse:  60  Resp:    Temp:  97.8 F (36.6 C)  SpO2: 99% 98%    Physical Exam: Cardiac:  regular Lungs:  Non labored Incisions:  Left groin with Prevena vac with good suction Extremities:  Left foot with motor and sensory in tact.  No doppler signals detected.    CBC    Component Value Date/Time   WBC 15.4 (H) 12/09/2020 0035   RBC 3.99 (L) 12/09/2020 0035   HGB 12.3 (L) 12/09/2020 0035   HCT 38.1 (L) 12/09/2020 0035   PLT 207 12/09/2020 0035   MCV 95.5 12/09/2020 0035   MCH 30.8 12/09/2020 0035   MCHC 32.3 12/09/2020 0035   RDW 14.5 12/09/2020 0035   LYMPHSABS 1.0 12/06/2020 1858   MONOABS 0.7 12/06/2020 1858   EOSABS 0.2 12/06/2020 1858   BASOSABS 0.1 12/06/2020 1858    BMET    Component Value Date/Time   NA 136 12/09/2020 0035   K 4.2 12/09/2020 0035   CL 103 12/09/2020 0035   CO2 26 12/09/2020 0035   GLUCOSE 142 (H) 12/09/2020 0035   BUN 12 12/09/2020 0035   CREATININE 0.93 12/09/2020 0035   CREATININE 0.69 05/11/2014 1533   CALCIUM 8.3 (L) 12/09/2020 0035   GFRNONAA >60 12/09/2020 0035   GFRNONAA >89 04/19/2014 1418   GFRAA >60 12/24/2018 0224   GFRAA >89 04/19/2014 1418    INR    Component Value Date/Time   INR 1.1 12/07/2020 0653     Intake/Output Summary (Last 24 hours) at 12/09/2020 0735 Last data filed at 12/09/2020 0600 Gross per 24 hour  Intake 650 ml  Output 1850 ml  Net -1200 ml     Assessment:  75 y.o. male is s/p:  Redo exposure left femoral artery with iliofemoral endarterectomy with bovine pericardial patch  1 Day Post-Op  Plan: -pt doing well this morning.  He does not have any doppler signals at the ankle, which is baseline.  He does not have pain in his foot, motor and  sensation are in tact.  -DVT prophylaxis:  Sq heparin to start this morning -pt hasn't been up since Wednesday.  Will mobilize pt today. -discussed with pt when Prevena can be removed and subsequent groin wound care.   Doreatha Massed, PA-C [facility] [phone] 12/09/2020 7:35 AM  VASCULAR STAFF ADDENDUM: I have independently interviewed and examined the patient. I agree with the above.  Looks great POD#1 L CFA re-do exposure and endarterectomy Biphasic DS in L AT Mobilize, voiding trial   Rande Brunt. Lenell Antu, MD [facility] Phone Number: [phone] 12/09/2020 10:32 AM

## 2020-12-09 NOTE — Progress Notes (Signed)
Mobility Specialist: Progress Note   12/09/20 1502  Mobility  Activity Ambulated in hall  Level of Assistance Standby assist, set-up cues, supervision of patient - no hands on  Assistive Device Four wheel walker  Distance Ambulated (ft) 210 ft  Mobility Response Tolerated well  Mobility performed by Mobility specialist  $Mobility charge 1 Mobility   Pre-Mobility: 63 HR, 94% SpO2 During Mobility: 83 HR, 95% SpO2 Post-Mobility: 80 HR, 145/75 BP, 97% SpO2  Pt c/o feeling slightly out of breath halfway through ambulation. Pt otherwise asx. Pt back to bed after walk with call bell at his side.   [facility] Christopher Pena Mobility Specialist Mobility Specialist Phone: [phone]

## 2020-12-09 NOTE — Evaluation (Signed)
Occupational Therapy Evaluation Patient Details Name: Christopher Pena MRN: [id number] DOB: 09-20-1945 Today's Date: 12/09/2020    History of Present Illness 75 y.o. male with abrupt onset of left lower extremity pain about the thigh, knee, calf, and foot.  PMH includes:Anxiety, Chronic low back pain, Claudication, Constipation due to pain medication, COPD (chronic obstructive pulmonary disease) (HCC), Hypertension, PAD.  Underwent LEFT FEMORAL ARTERY ENDARTERECTOMY.   Clinical Impression   Patient presents with the above diagnosis and procedure.  PTA he lived alone in a bottom floor apartment with PRN assist from family.  He was essentially independent with bathing/dressing, meal prep, meds and community mobility.  He occasionally uses a RW if he is SOB.  Barriers are listed below.  Currently he admits to being a little stiff and unsteady with in room mobility/toielting, and needed up to Min A for ADL due to L groin soreness.  No real acute OT needs identified, and he is not interested in [facility] services.  Encouraged patient to walk the halls multiple times each day with staff.  PT eval pending.  Family can provide assist as needed at his apartment.      Follow Up Recommendations  No OT follow up    Equipment Recommendations  None recommended by OT    Recommendations for Other Services       Precautions / Restrictions Precautions Precautions: Fall Precaution Comments: wound vac Restrictions Weight Bearing Restrictions: No Other Position/Activity Restrictions: Chronic O2      Mobility Bed Mobility Overal bed mobility: Independent               Patient Response: Cooperative  Transfers Overall transfer level: Modified independent Equipment used: Rolling walker (2 wheeled)                  Balance Overall balance assessment: Needs assistance Sitting-balance support: Feet supported Sitting balance-Leahy Scale: Good     Standing balance support: Bilateral upper  extremity supported Standing balance-Leahy Scale: Fair Standing balance comment: able to let go of RW in static stand                           ADL either performed or assessed with clinical judgement   ADL                                         General ADL Comments: Needed Min A to donn socks due to soreness at groin wound.  Otherwise he is at baseline.  Admits to being a little unsteady, but has not been up and moving since admission.  PT eval pending     Vision Patient Visual Report: No change from baseline                  Pertinent Vitals/Pain Pain Assessment: 0-10 Pain Score: 2  Pain Location: groin wound Pain Descriptors / Indicators: Sore Pain Intervention(s): Monitored during session     Hand Dominance Right   Extremity/Trunk Assessment Upper Extremity Assessment Upper Extremity Assessment: Overall WFL for tasks assessed   Lower Extremity Assessment Lower Extremity Assessment: Defer to PT evaluation   Cervical / Trunk Assessment Cervical / Trunk Assessment: Normal   Communication Communication Communication: No difficulties   Cognition Arousal/Alertness: Awake/alert Behavior During Therapy: WFL for tasks assessed/performed Overall Cognitive Status: Within Functional Limits for tasks assessed  Home Living Family/patient expects to be discharged to:: Private residence Living Arrangements: Alone Available Help at Discharge: Family;Available PRN/intermittently Type of Home: Apartment Home Access: Level entry     Home Layout: One level     Bathroom Shower/Tub: Chief Strategy Officer: Handicapped height Bathroom Accessibility: Yes How Accessible: Accessible via walker Home Equipment: Walker - 2 wheels;Walker - 4 wheels;Grab bars - tub/shower;Grab bars - toilet;Bedside commode          Prior Functioning/Environment Level of  Independence: Independent with assistive device(s)        Comments: daughters assist as needed, but he tries not to bother them.  drives to grocery store and [place] apts.  Completes his own ADL/IADL without assist.  RW if needed, depends if he is SOB.        OT Problem List: Decreased activity tolerance;Impaired balance (sitting and/or standing);Pain      OT Treatment/Interventions:      OT Goals(Current goals can be found in the care plan section) Acute Rehab OT Goals Patient Stated Goal: I'm hoping to go home tomorrow OT Goal Formulation: With patient Time For Goal Achievement: 12/09/20 Potential to Achieve Goals: Good  OT Frequency:     Barriers to D/C:  none noted          Co-evaluation              AM-PAC OT "6 Clicks" Daily Activity     Outcome Measure Help from another person eating meals?: None Help from another person taking care of personal grooming?: None Help from another person toileting, which includes using toliet, bedpan, or urinal?: None Help from another person bathing (including washing, rinsing, drying)?: A Little Help from another person to put on and taking off regular upper body clothing?: None Help from another person to put on and taking off regular lower body clothing?: A Little 6 Click Score: 22   End of Session Equipment Utilized During Treatment: Rolling walker;Oxygen Nurse Communication: Mobility status  Activity Tolerance: Patient tolerated treatment well Patient left: in chair;with call bell/phone within reach  OT Visit Diagnosis: Unsteadiness on feet (R26.81);Pain Pain - Right/Left: Left Pain - part of body: Leg                Time: 7654-6503 OT Time Calculation (min): 29 min Charges:  OT General Charges $OT Visit: 1 Visit OT Evaluation $OT Eval Moderate Complexity: 1 Mod OT Treatments $Self Care/Home Management : 8-22 mins  12/09/2020  Rich, OTR/L  Acute Rehabilitation Services  Office:  [phone]   Suzanna Obey 12/09/2020, 11:09 AM

## 2020-12-10 MED ORDER — ROSUVASTATIN CALCIUM 20 MG PO TABS: 20.0000 mg | ORAL_TABLET | Freq: Every day | ORAL | 11 refills | Status: DC

## 2020-12-10 MED ORDER — ASPIRIN 81 MG PO TBEC: 81.0000 mg | DELAYED_RELEASE_TABLET | Freq: Every day | ORAL | Status: DC

## 2020-12-10 NOTE — Discharge Instructions (Signed)
 [facility] [place]  Discharge instructions  Lower Extremity Bypass Surgery  Please refer to the following instruction for your post-procedure care. Your surgeon or physician assistant will discuss any changes with you.  Activity  You are encouraged to walk as much as you can. You can slowly return to normal activities during the month after your surgery. Avoid strenuous activity and heavy lifting until your doctor tells you it's OK. Avoid activities such as vacuuming or swinging a golf club. Do not drive until your doctor give the OK and you are no longer taking prescription pain medications. It is also normal to have difficulty with sleep habits, eating and bowel movement after surgery. These will go away with time.  Bathing/Showering  Shower daily after you go home. Do not soak in a bathtub, hot tub, or swim until the incision heals completely.  Incision Care  Clean your incision with mild soap and water. Shower every day. Pat the area dry with a clean towel. You do not need a bandage unless otherwise instructed. Do not apply any ointments or creams to your incision. If you have open wounds you will be instructed how to care for them or a visiting nurse may be arranged for you. If you have staples or sutures along your incision they will be removed at your post-op appointment. You may have skin glue on your incision. Do not peel it off. It will come off on its own in about one week.  Keep Pravena wound vac on your groin incision until it loses it seal in about 7-10 days.  Once that happens, you can remove and then wash the groin wound with soap and water daily and pat dry. (No tub bath-only shower)  Then put a dry gauze or washcloth in the groin to keep this area dry to help prevent wound infection.  Do this daily and as needed.  Do not use Vaseline or neosporin on your incisions.  Only use soap and water on your incisions and then protect and keep  dry.   Diet  Resume your normal diet. There are no special food restrictions following this procedure. A low fat/ low cholesterol diet is recommended for all patients with vascular disease. In order to heal from your surgery, it is CRITICAL to get adequate nutrition. Your body requires vitamins, minerals, and protein. Vegetables are the best source of vitamins and minerals. Vegetables also provide the perfect balance of protein. Processed food has little nutritional value, so try to avoid this.  Medications  Resume taking all your medications unless your doctor or physician assistant tells you not to. If your incision is causing pain, you may take over-the-counter pain relievers such as acetaminophen (Tylenol). If you were prescribed a stronger pain medication, please aware these medication can cause nausea and constipation. Prevent nausea by taking the medication with a snack or meal. Avoid constipation by drinking plenty of fluids and eating foods with high amount of fiber, such as fruits, vegetables, and grains. Take Colace 100 mg (an over-the-counter stool softener) twice a day as needed for constipation.  Do not take Tylenol if you are taking prescription pain medications.  Follow Up  Our office will schedule a follow up appointment 2-3 weeks following discharge.  Please call us immediately for any of the following conditions  Severe or worsening pain in your legs or feet while at rest or while walking Increase pain, redness, warmth, or drainage (pus) from your incision site(s) Fever of 101   degree or higher The swelling in your leg with the bypass suddenly worsens and becomes more painful than when you were in the hospital If you have been instructed to feel your graft pulse then you should do so every day. If you can no longer feel this pulse, call the office immediately. Not all patients are given this instruction.  Leg swelling is common after leg bypass surgery.  The swelling should  improve over a few months following surgery. To improve the swelling, you may elevate your legs above the level of your heart while you are sitting or resting. Your surgeon or physician assistant may ask you to apply an ACE wrap or wear compression (TED) stockings to help to reduce swelling.  Reduce your risk of vascular disease  Stop smoking. If you would like help call [facility] at 1-800-QUIT-NOW (1-800-784-8669) or  at 336-586-4000.  Manage your cholesterol Maintain a desired weight Control your diabetes weight Control your diabetes Keep your blood pressure down  If you have any questions, please call the office at [phone]  

## 2020-12-10 NOTE — Discharge Summary (Signed)
Discharge Summary     ERLE GUSTER 1946/07/14 75 y.o. male  [id number]  Admission Date: 12/06/2020  Discharge Date: 12/10/2020  Physician: Leonie Douglas, MD  Admission Diagnosis: Arterial occlusion, lower extremity (HCC) [I70.209] Atherosclerosis of left lower extremity with rest pain (HCC) [I70.222] Atherosclerosis of artery of extremity with rest pain [facility]) [I70.229]  HPI:   This is a 75 y.o. male well-known to our service.  He is a patient of Dr. Estanislado Spire who is undergone multiple revascularization efforts in the bilateral lower extremities, see below for details.  Patient was lost to follow-up in 2020.  He reports he had been doing well with ambulation until earlier today.  He has severe COPD and has a difficult time ambulating because of dyspnea.  I suspect he does not walk fast enough to claudicate.  Earlier today he had abrupt onset of left lower extremity pain about the thigh, knee, calf, and foot.  Gradually the pain improved in his thigh and calf.  The pain persists in his foot.  He reports diminished sensation across the left foot, but states this is improving.  He has a difficult time walking since onset of symptoms, but is able to move his lower extremity.  Hospital Course:  The patient was admitted to the hospital and taken to the operating room on 12/08/2020 and underwent: Redo exposure left femoral artery with iliofemoral endarterectomy with bovine pericardial patch     Findings: Occlusive thrombus within the common femoral artery.  I removed the prior Gore-Tex graft off of the common femoral artery and performed a thrombectomy as well as endarterectomy of the common femoral artery and the distal external iliac artery.  A 6 cm bovine pericardial patch was placed on the common femoral artery.  There was dense scar tissue surrounding the artery which was sharply dissected free.  There remains a crossing circumflex iliac vein which is in close proximity to the  proximal portion of the patch.  I isolated 2 main profunda branches.  One was medial to the common femoral artery and the other was lateral.  The pt tolerated the procedure well and was transported to the PACU in good condition.   By POD 1, he was doing well.  He had a faint left AT doppler signal.  Motor and sensation were in tact.   POD 2, pt was doing well.  Continued to have faint left AT doppler signal.  Motor and sensation in tact.  He walked a couple of times on POD 1 and did quite well.  Pt walked again a couple more times and did well.  He did have a little SOB during walking but recovered quickly after returning to room.     CBC    Component Value Date/Time   WBC 15.4 (H) 12/09/2020 0035   RBC 3.99 (L) 12/09/2020 0035   HGB 12.3 (L) 12/09/2020 0035   HCT 38.1 (L) 12/09/2020 0035   PLT 207 12/09/2020 0035   MCV 95.5 12/09/2020 0035   MCH 30.8 12/09/2020 0035   MCHC 32.3 12/09/2020 0035   RDW 14.5 12/09/2020 0035   LYMPHSABS 1.0 12/06/2020 1858   MONOABS 0.7 12/06/2020 1858   EOSABS 0.2 12/06/2020 1858   BASOSABS 0.1 12/06/2020 1858    BMET    Component Value Date/Time   NA 136 12/09/2020 0035   K 4.2 12/09/2020 0035   CL 103 12/09/2020 0035   CO2 26 12/09/2020 0035   GLUCOSE 142 (H) 12/09/2020 0035   BUN 12  12/09/2020 0035   CREATININE 0.93 12/09/2020 0035   CREATININE 0.69 05/11/2014 1533   CALCIUM 8.3 (L) 12/09/2020 0035   GFRNONAA >60 12/09/2020 0035   GFRNONAA >89 04/19/2014 1418   GFRAA >60 12/24/2018 0224   GFRAA >89 04/19/2014 1418     Discharge Instructions    Discharge patient   Complete by: As directed    Discharge pt later today after he has walked in the hallway 2-3 more times at his comfort level.   Discharge disposition: 01-Home or Self Care   Discharge patient date: 12/10/2020      Discharge Diagnosis:  Arterial occlusion, lower extremity (HCC) [I70.209] Atherosclerosis of left lower extremity with rest pain (HCC)  [I70.222] Atherosclerosis of artery of extremity with rest pain (HCC) [I70.229]  Secondary Diagnosis: Patient Active Problem List   Diagnosis Date Noted  . Atherosclerosis of artery of extremity with rest pain (HCC) 12/07/2020  . Atherosclerosis of left lower extremity with rest pain (HCC) 12/06/2020  . DOE (dyspnea on exertion) 09/06/2019  . Chronic respiratory failure with hypoxia (HCC) 02/05/2019  . Acute on chronic respiratory failure with hypoxia (HCC) 12/17/2018  . Hyperlipidemia 02/12/2017  . Physical exam 08/14/2016  . Femoral-popliteal bypass graft occlusion, left (HCC) 05/21/2016  . Allergic rhinitis 05/13/2016  . Protein-calorie malnutrition, severe (HCC) 04/08/2016  . Pain in joint, lower leg 10/17/2014  . Encounter for post surgical wound check 12/09/2013  . Peripheral vascular disease, unspecified (HCC) 11/08/2013  . Leg edema, left, foot 06/22/2013  . Cellulitis of foot, left 06/09/2013  . Non-healing wound of lower extremity, lt great toe 06/09/2013  . Cigarette smoker 06/09/2013  . Claudication, lifestyle limiting 05/11/2013  . Unexplained weight loss 04/28/2013  . PAD (peripheral artery disease), PTA/stent IDEV & chocolate baloon 06/08/13, Previous PTA/Stent to Rt SFA 05/10/13 04/13/2013  . COPD III 04/13/2013  . HTN (hypertension) 04/13/2013  . Anxiety 04/13/2013  . Low back pain 04/13/2013   Past Medical History:  Diagnosis Date  . Anxiety   . Chronic low back pain    "degenerative spine dx'd ~ 7 yr ago" (06/08/2013)  . Claudication (HCC)   . Constipation due to pain medication   . COPD (chronic obstructive pulmonary disease) (HCC)   . Hypertension    "moderately high; RX didn't help" (06/08/2013) - no longer on meds (as of 09/19/16)  . Neuromuscular disorder (HCC)    degen spine  . Non-healing wound of lower extremity 06/09/2013  . PAD (peripheral artery disease), PTA/stent IDEV & chocolate baloon 06/08/13 04/13/2013   Severely reduced ABI's of 0.3  bilaterally   . Tobacco use 06/09/2013  . Unexplained weight loss      Allergies as of 12/10/2020      Reactions   Clindamycin/lincomycin Diarrhea      Medication List    STOP taking these medications   azithromycin 250 MG tablet Commonly known as: Zithromax Z-Pak     TAKE these medications   albuterol 108 (90 Base) MCG/ACT inhaler Commonly known as: VENTOLIN HFA Inhale 2 puffs into the lungs every 6 (six) hours as needed for wheezing or shortness of breath.   aspirin 81 MG EC tablet Take 1 tablet (81 mg total) by mouth daily at 6 (six) AM. Swallow whole. Start taking on: December 11, 2020   busPIRone 7.5 MG tablet Commonly known as: BUSPAR TAKE 1 TABLET BY MOUTH TWICE A DAY   cilostazol 100 MG tablet Commonly known as: PLETAL TAKE ONE TABLET BY MOUTH TWICE DAILY BEFORE MEALS  What changed:   how much to take  how to take this  when to take this  additional instructions   clopidogrel 75 MG tablet Commonly known as: PLAVIX TAKE 1 TABLET BY MOUTH EVERY DAY   ipratropium-albuterol 0.5-2.5 (3) MG/3ML Soln Commonly known as: DUONEB TAKE THREE MLS VIA NEBULIZER EVERY 6 HOURS AS NEEDED What changed: See the new instructions.   oxyCODONE 15 MG immediate release tablet Commonly known as: ROXICODONE Take 15 mg by mouth every 4 (four) hours.   rosuvastatin 20 MG tablet Commonly known as: CRESTOR Take 1 tablet (20 mg total) by mouth daily.   sertraline 25 MG tablet Commonly known as: ZOLOFT Take 1 tablet (25 mg total) by mouth daily.       Discharge Instructions: [facility] Discharge instructions Lower Extremity Bypass Surgery  Please refer to the following instruction for your post-procedure care. Your surgeon or physician assistant will discuss any changes with you.  Activity  You are encouraged to walk as much as you can. You can slowly return to normal activities during the month after your surgery. Avoid strenuous  activity and heavy lifting until your doctor tells you it's OK. Avoid activities such as vacuuming or swinging a golf club. Do not drive until your doctor give the OK and you are no longer taking prescription pain medications. It is also normal to have difficulty with sleep habits, eating and bowel movement after surgery. These will go away with time.  Bathing/Showering  You may shower after you go home. Do not soak in a bathtub, hot tub, or swim until the incision heals completely.  Incision Care  Clean your incision with mild soap and water. Shower every day. Pat the area dry with a clean towel. You do not need a bandage unless otherwise instructed. Do not apply any ointments or creams to your incision. If you have open wounds you will be instructed how to care for them or a visiting nurse may be arranged for you. If you have staples or sutures along your incision they will be removed at your post-op appointment. You may have skin glue on your incision. Do not peel it off. It will come off on its own in about one week.  Wash the groin wound with soap and water daily and pat dry. (No tub bath-only shower)  Then put a dry gauze or washcloth in the groin to keep this area dry to help prevent wound infection.  Do this daily and as needed.  Do not use Vaseline or neosporin on your incisions.  Only use soap and water on your incisions and then protect and keep dry.  Diet  Resume your normal diet. There are no special food restrictions following this procedure. A low fat/ low cholesterol diet is recommended for all patients with vascular disease. In order to heal from your surgery, it is CRITICAL to get adequate nutrition. Your body requires vitamins, minerals, and protein. Vegetables are the best source of vitamins and minerals. Vegetables also provide the perfect balance of protein. Processed food has little nutritional value, so try to avoid this.  Medications  Resume taking all your medications  unless your doctor or Physician Assistant tells you not to. If your incision is causing pain, you may take over-the-counter pain relievers such as acetaminophen (Tylenol). If you were prescribed a stronger pain medication, please aware these medication can cause nausea and constipation. Prevent nausea by taking the medication with a snack or meal. Avoid  constipation by drinking plenty of fluids and eating foods with high amount of fiber, such as fruits, vegetables, and grains. Take Colace 100 mg (an over-the-counter stool softener) twice a day as needed for constipation.  Do not take Tylenol if you are taking prescription pain medications.  Follow Up  Our office will schedule a follow up appointment 2-3 weeks following discharge.  Please call us immediately for any of the following conditions  .Severe or worsening pain in your legs or feet while at rest or while walking .Increase pain, redness, warmth, or drainage (pus) from your incision site(s) . Fever of 101 degree or higher . The swelling in your leg with the bypass suddenly worsens and becomes more painful than when you were in the hospital . If you have been instructed to feel your graft pulse then you should do so every day. If you can no longer feel this pulse, call the office immediately. Not all patients are given this instruction. .  Leg swelling is common after leg bypass surgery.  The swelling should improve over a few months following surgery. To improve the swelling, you may elevate your legs above the level of your heart while you are sitting or resting. Your surgeon or physician assistant may ask you to apply an ACE wrap or wear compression (TED) stockings to help to reduce swelling.  Reduce your risk of vascular disease  Stop smoking. If you would like help call [facility] at 1-800-QUIT-NOW (838-675-4024) or [place] at [phone].  . Manage your cholesterol . Maintain a desired weight . Control your diabetes  weight . Control your diabetes . Keep your blood pressure down .  If you have any questions, please call the office at [phone]   Prescriptions given: None, pt has pain management contract  Disposition: home  Patient's condition: is Good  Follow up: 1. [facility] in 2-3 weeks   Doreatha Massed, PA-C [facility] [phone] 12/10/2020  9:03 AM  - For VQI Registry use ---   Post-op:  Wound infection: No  Graft infection: No  Transfusion: No    If yes, n/a units given New Arrhythmia: No Ipsilateral amputation: No, [ ]  Minor, [ ]  BKA, [ ]  AKA Discharge patency:  n/a Patency judged by: [x]  Dopper only, [ ]  Palpable graft pulse, []  Palpable distal pulse, [ ]  ABI inc. > 0.15, [ ]  Duplex Discharge ABI: R not done, L  D/C Ambulatory Status: Ambulatory  Complications: MI: No, [ ]  Troponin only, [ ]  EKG or Clinical CHF: No Resp failure:No, [ ]  Pneumonia, [ ]  Ventilator Chg in renal function: No, [ ]  Inc. Cr > 0.5, [ ]  Temp. Dialysis,  [ ]  Permanent dialysis Stroke: No, [ ]  Minor, [ ]  Major Return to OR: No  Reason for return to OR: [ ]  Bleeding, [ ]  Infection, [ ]  Thrombosis, [ ]  Revision  Discharge medications: Statin use:  yes ASA use:  yes Plavix use:  yes Beta blocker use: no CCB use:  No ACEI use:   no ARB use:  no Coumadin use: no

## 2020-12-10 NOTE — Progress Notes (Signed)
Mobility Specialist: Progress Note   12/10/20 1331  Mobility  Activity Ambulated in hall  Level of Assistance Independent after set-up  Assistive Device Four wheel walker  Distance Ambulated (ft) 240 ft  Mobility Response Tolerated well  Mobility performed by Mobility specialist  $Mobility charge 1 Mobility   Pt c/o feeling a little SOB during ambulation but recovered quickly after returning to room. Pt hopeful for discharge soon.   [facility] Izza Bickle Mobility Specialist Mobility Specialist Phone: [phone]

## 2020-12-10 NOTE — Progress Notes (Signed)
Mobility Specialist: Progress Note   12/10/20 1051  Mobility  Activity Ambulated in hall  Level of Assistance Independent after set-up  Assistive Device Front wheel walker  Distance Ambulated (ft) 240 ft  Mobility Response Tolerated well  Mobility performed by Mobility specialist  $Mobility charge 1 Mobility   Pre-Mobility on 2 L/min [place]: 70 HR, 97% SpO2 During Mobility on 1-2 L/min [place]: 91% SpO2 Post-Mobility on 2 L/min [place]: 93 HR, 162/86 BP, 93% SpO2  Pt began ambulation on 1 L/min [place]. Pt stopped for two brief standing breaks due to feeling SOB, sats as seen above. O2 flow increased to 2 L/min to finish walk. Pt back to bed after walk and is requesting to walk one more time before discharge. Will f/u as able.   [facility] Michele Judy Mobility Specialist Mobility Specialist Phone: [phone]

## 2020-12-10 NOTE — Progress Notes (Signed)
Pt discharged to home with family.  Pt's IV's removed.  Pt taken off telemetry and CCMD notified.  Pt left with all of their personal belongings.  AVS documentation reviewed and sent home with PT and all questions answered.

## 2020-12-10 NOTE — Progress Notes (Addendum)
  Progress Note    12/10/2020 8:59 AM 2 Days Post-Op  Subjective:  Says he did well walking yesterday and wants to go home.  Says he is on oxygen at home.  Says he has a pain management doctor and does not want pain meds at discharge.  Afebrile HR 60's-80's  110's-130's systolic 100% 2LO2NC  Vitals:   12/10/20 0351 12/10/20 0823  BP: 118/67 134/75  Pulse: 62 66  Resp: 16 18  Temp: 98.5 F (36.9 C) 97.9 F (36.6 C)  SpO2: 98% 98%    Physical Exam: Cardiac:  regular Lungs:  Non labored Incisions:  Prevena vac in place with good seal Extremities:  Faint left AT doppler signal; foot warm to just below toes and toes are cooler to the touch  CBC    Component Value Date/Time   WBC 15.4 (H) 12/09/2020 0035   RBC 3.99 (L) 12/09/2020 0035   HGB 12.3 (L) 12/09/2020 0035   HCT 38.1 (L) 12/09/2020 0035   PLT 207 12/09/2020 0035   MCV 95.5 12/09/2020 0035   MCH 30.8 12/09/2020 0035   MCHC 32.3 12/09/2020 0035   RDW 14.5 12/09/2020 0035   LYMPHSABS 1.0 12/06/2020 1858   MONOABS 0.7 12/06/2020 1858   EOSABS 0.2 12/06/2020 1858   BASOSABS 0.1 12/06/2020 1858    BMET    Component Value Date/Time   NA 136 12/09/2020 0035   K 4.2 12/09/2020 0035   CL 103 12/09/2020 0035   CO2 26 12/09/2020 0035   GLUCOSE 142 (H) 12/09/2020 0035   BUN 12 12/09/2020 0035   CREATININE 0.93 12/09/2020 0035   CREATININE 0.69 05/11/2014 1533   CALCIUM 8.3 (L) 12/09/2020 0035   GFRNONAA >60 12/09/2020 0035   GFRNONAA >89 04/19/2014 1418   GFRAA >60 12/24/2018 0224   GFRAA >89 04/19/2014 1418    INR    Component Value Date/Time   INR 1.1 12/07/2020 0653     Intake/Output Summary (Last 24 hours) at 12/10/2020 0859 Last data filed at 12/10/2020 0351 Gross per 24 hour  Intake --  Output 775 ml  Net -775 ml     Assessment:  75 y.o. male is s/p:  Redo exposure left femoral artery with iliofemoral endarterectomy with bovine pericardial patch  2 Days Post-Op  Plan: -pt with faint  left AT doppler signal.  Motor and sensation continue to be in tact. -pt did walk a couple of times yesterday and did quite well.  He would like to walk a few more times today and discharge home later this afternoon.  He does have family support near by.   -pt has been started on statin/asa this hospitalization and will go home with rx for this.   -no pain medication given due to pain management contract.  -discussed with pt about Prevena vac and when he can remove this and shower.  This is in his discharge instructions. -pt on O2.  He is on continuous O2 at home.    Doreatha Massed, PA-C [facility] [phone] 12/10/2020 8:59 AM  VASCULAR STAFF ADDENDUM: I have independently interviewed and examined the patient. I agree with the above.  Ready for DC. Follow up with PA, me, or Dr. Myra Gianotti in next 2-4 weeks for wound check, ABI.  Rande Brunt. Lenell Antu, MD [facility] Phone Number: [phone] 12/10/2020 10:26 AM

## 2020-12-11 ENCOUNTER — Encounter (HOSPITAL_COMMUNITY): Payer: Self-pay | Admitting: Surgery

## 2020-12-24 DIAGNOSIS — J9621 Acute and chronic respiratory failure with hypoxia: Secondary | ICD-10-CM | POA: Diagnosis not present

## 2020-12-24 DIAGNOSIS — J441 Chronic obstructive pulmonary disease with (acute) exacerbation: Secondary | ICD-10-CM | POA: Diagnosis not present

## 2020-12-27 DIAGNOSIS — M79604 Pain in right leg: Secondary | ICD-10-CM | POA: Diagnosis not present

## 2020-12-27 DIAGNOSIS — M5432 Sciatica, left side: Secondary | ICD-10-CM | POA: Diagnosis not present

## 2020-12-27 DIAGNOSIS — M545 Low back pain, unspecified: Secondary | ICD-10-CM | POA: Diagnosis not present

## 2020-12-27 DIAGNOSIS — M5431 Sciatica, right side: Secondary | ICD-10-CM | POA: Diagnosis not present

## 2020-12-27 DIAGNOSIS — G8929 Other chronic pain: Secondary | ICD-10-CM | POA: Diagnosis not present

## 2020-12-27 DIAGNOSIS — Z79891 Long term (current) use of opiate analgesic: Secondary | ICD-10-CM | POA: Diagnosis not present

## 2020-12-27 DIAGNOSIS — G890 Central pain syndrome: Secondary | ICD-10-CM | POA: Diagnosis not present

## 2020-12-27 DIAGNOSIS — G894 Chronic pain syndrome: Secondary | ICD-10-CM | POA: Diagnosis not present

## 2020-12-27 DIAGNOSIS — M79605 Pain in left leg: Secondary | ICD-10-CM | POA: Diagnosis not present

## 2021-01-02 ENCOUNTER — Telehealth: Payer: Self-pay

## 2021-01-02 NOTE — Chronic Care Management (AMB) (Signed)
Chronic Care Management Pharmacy Assistant   Name: Christopher Pena  MRN: [id number] DOB: [birth year]  Reason for Encounter: Medication Coordination Call  Recent office visits:  No visits noted  Recent consult visits:  No visits noted  Hospital visits:  Medication Reconciliation was completed by comparing discharge summary, patient's EMR and Pharmacy list, and upon discussion with patient.  Admitted to the hospital on 12/06/2020 due to Arterial occlusion, lower extremity . Discharge date was 12/10/2020. Discharged from [facility].    started rosuvastatin 20 mg Started aspirin 81 mg  All other medications  remain the same.    Medications: Outpatient Encounter Medications as of 01/02/2021  Medication Sig Note  . albuterol (VENTOLIN HFA) 108 (90 Base) MCG/ACT inhaler Inhale 2 puffs into the lungs every 6 (six) hours as needed for wheezing or shortness of breath.   Marland Kitchen aspirin EC 81 MG EC tablet Take 1 tablet (81 mg total) by mouth daily at 6 (six) AM. Swallow whole.   . busPIRone (BUSPAR) 7.5 MG tablet TAKE 1 TABLET BY MOUTH TWICE A DAY (Patient taking differently: Take 7.5 mg by mouth 2 (two) times daily.)   . cilostazol (PLETAL) 100 MG tablet TAKE ONE TABLET BY MOUTH TWICE DAILY BEFORE MEALS (Patient taking differently: Take 100 mg by mouth 2 (two) times daily. BEFORE MEALS) 12/07/2020: New RX  . clopidogrel (PLAVIX) 75 MG tablet TAKE 1 TABLET BY MOUTH EVERY DAY (Patient taking differently: Take 75 mg by mouth daily.)   . ipratropium-albuterol (DUONEB) 0.5-2.5 (3) MG/3ML SOLN TAKE THREE MLS VIA NEBULIZER EVERY 6 HOURS AS NEEDED (Patient taking differently: Inhale 3 mLs into the lungs every 6 (six) hours as needed (shortness of breath).)   . oxyCODONE (ROXICODONE) 15 MG immediate release tablet Take 15 mg by mouth every 4 (four) hours. 12/07/2020: LF on 11-29-20 #180 DS 30  . rosuvastatin (CRESTOR) 20 MG tablet Take 1 tablet (20 mg total) by mouth daily.   . sertraline  (ZOLOFT) 25 MG tablet Take 1 tablet (25 mg total) by mouth daily.    No facility-administered encounter medications on file as of 01/02/2021.    Reviewed chart for medication changes ahead of medication coordination call.  No OVs, Consults, or hospital visits since last care coordination call/Pharmacist visit. (If appropriate, list visit date, provider name)  No medication changes indicated OR if recent visit, treatment plan here.  BP Readings from Last 3 Encounters:  12/10/20 116/76  08/08/20 124/88  09/06/19 (!) 142/84    Lab Results  Component Value Date   HGBA1C 5.7 (H) 12/17/2018     Patient obtains medications through Vials  90 Days   Last adherence delivery included: Cilostazol 100 mg tab Take one tab twice daily before meals Sertraline 25 mg Tab Take one tab by mouth daily Clopidogrel 75 mg tablet Take one tablet by mouth once daily Buspirone 7.5mg  Tab Take one tab by mouth twice daily  Patient is due for next adherence delivery on: 01/12/2021. Called patient and reviewed medications and coordinated delivery.  This delivery to include: Clopidogrel 75 mg tablet Take one tablet by mouth once daily  Patient declined the following medications due to a month supply remaining: Cilostazol 100 mg tab Take one tab twice daily before meals Sertraline 25 mg Tab Take one tab by mouth daily Buspirone 7.5mg  Tab Take one tab by mouth twice daily  Confirmed delivery date of 01/12/2021, advised patient that pharmacy will contact them the morning of delivery.  Purvis Kilts  CPA. CMA

## 2021-01-03 ENCOUNTER — Other Ambulatory Visit: Payer: Self-pay | Admitting: Family Medicine

## 2021-01-10 ENCOUNTER — Telehealth: Payer: Self-pay

## 2021-01-10 ENCOUNTER — Telehealth: Payer: Medicare Other

## 2021-01-10 NOTE — Telephone Encounter (Signed)
  Chronic Care Management   Outreach Note   Name: Christopher Pena MRN: [id number] DOB: Mar 03, 1946  Referred by: Sheliah Hatch, MD Reason for referral: Telephone Appointment with [facility] Clinical Pharmacist, Dahlia Byes.   An unsuccessful telephone outreach was attempted today. The patient was referred to the pharmacist for assistance with care management and care coordination.   Telephone appointment with clinical pharmacist today (01/10/2021) at 11am. If patient returns call, transfer to [phone]. Otherwise, please provide this number so patient can reschedule visit.   Dahlia Byes, Pharm.D., BCGP Clinical Pharmacist [facility] [phone]

## 2021-01-17 ENCOUNTER — Ambulatory Visit (INDEPENDENT_AMBULATORY_CARE_PROVIDER_SITE_OTHER): Payer: Medicare Other | Admitting: Physician Assistant

## 2021-01-17 ENCOUNTER — Other Ambulatory Visit: Payer: Self-pay

## 2021-01-17 VITALS — Resp 20 | Ht 67.0 in

## 2021-01-17 DIAGNOSIS — I779 Disorder of arteries and arterioles, unspecified: Secondary | ICD-10-CM

## 2021-01-17 DIAGNOSIS — I714 Abdominal aortic aneurysm, without rupture, unspecified: Secondary | ICD-10-CM

## 2021-01-17 NOTE — Progress Notes (Signed)
POST OPERATIVE OFFICE NOTE    CC:  F/u for surgery  HPI:  This is a 75 y.o. male who is s/p left femoral thrombectomy with left iliofemoral endarterectomy with bovine pericardial patch angioplasty by Dr. Myra Gianotti on 12/08/2020. He had previously undergone a vein femoral tibial bypass and a Gore-Tex femoral-tibial bypass.  His bypass graft is chronic occluded.  He developed rest pain in his left leg and a CT scan showed common femoral occlusion.  Instructions were to follow-up in 2-3 weeks on Dr. Estanislado Spire clinic day; however, the patient is on chronic O2 supplementation and was not able to ambulate on the day of his appointment due to fatigue.  He is currently without significant pain.  No rest pain.  No skin ulceration.  Prior history of right femoral to below knee popliteal bypass graft with vein. Subsequent occlusion and thrombectomy and Dr. Derrill Center to redo his bypass graft, this time using PTFE down from the common femoral to the anterior tibial artery on 09-20-16. Abnormal dilatation of proximal aorta to 3.2 cm in 2019.  He is not diabetic and quit smoking in 2018. He continues Pletal for claudication. He is compliant with aspirin, Plavix and statin.  Allergies  Allergen Reactions  . Clindamycin/Lincomycin Diarrhea    Current Outpatient Medications  Medication Sig Dispense Refill  . albuterol (VENTOLIN HFA) 108 (90 Base) MCG/ACT inhaler Inhale 2 puffs into the lungs every 6 (six) hours as needed for wheezing or shortness of breath. 18 g 3  . aspirin EC 81 MG EC tablet Take 1 tablet (81 mg total) by mouth daily at 6 (six) AM. Swallow whole.    . busPIRone (BUSPAR) 7.5 MG tablet TAKE 1 TABLET BY MOUTH TWICE A DAY (Patient taking differently: Take 7.5 mg by mouth 2 (two) times daily.) 180 tablet 1  . cilostazol (PLETAL) 100 MG tablet TAKE ONE TABLET BY MOUTH TWICE DAILY BEFORE MEALS (Patient taking differently: Take 100 mg by mouth 2 (two) times daily. BEFORE MEALS) 180 tablet 1  .  clopidogrel (PLAVIX) 75 MG tablet TAKE 1 TABLET BY MOUTH EVERY DAY (Patient taking differently: Take 75 mg by mouth daily.) 90 tablet 1  . ipratropium-albuterol (DUONEB) 0.5-2.5 (3) MG/3ML SOLN TAKE THREE MLS VIA NEBULIZER EVERY 6 HOURS AS NEEDED (Patient taking differently: Inhale 3 mLs into the lungs every 6 (six) hours as needed (shortness of breath).) 360 mL 0  . oxyCODONE (ROXICODONE) 15 MG immediate release tablet Take 15 mg by mouth every 4 (four) hours.    . rosuvastatin (CRESTOR) 20 MG tablet Take 1 tablet (20 mg total) by mouth daily. 30 tablet 11  . sertraline (ZOLOFT) 25 MG tablet Take 1 tablet (25 mg total) by mouth daily. 90 tablet 2   No current facility-administered medications for this visit.     ROS:  See HPI  Vitals:   01/17/21 1326  Height: 5\' 7"  (1.702 m)   Physical Exam:  General appearance: In no apparent distress Cardiac: Rate and rhythm are regular Respiratory: Nonlabored Incision: Left groin incision has 1 small area of skin edge separation without drainage or signs of cellulitis. Extremities: Both feet are warm and appear well-perfused.  2+ bilateral femoral artery pulses. No skin breakdown. Neuro: Oriented x4. Abdomen: Soft, nondistended no palpable mass  Left groin: superior aspect on left >>>inferior aspect on right    Assessment/Plan:  This is a 75 y.o. male who is s/p: left femoral thrombectomy with left iliofemoral endarterectomy with bovine pericardial patch angioplasty.  A mild  degree of delayed healing of the left groin but no signs of dehiscence or cellulitis.  We will recheck in 1 month.  At that time we will bring his noninvasive studies up-to-date to include bilateral ABIs, lower extremity arterial duplex and aorta ultrasound.  Wendi Maya, PA-C [facility] [phone]  Clinic MD: Dr. Edilia Bo

## 2021-01-23 ENCOUNTER — Other Ambulatory Visit: Payer: Self-pay

## 2021-01-23 DIAGNOSIS — I714 Abdominal aortic aneurysm, without rupture, unspecified: Secondary | ICD-10-CM

## 2021-01-23 DIAGNOSIS — M5431 Sciatica, right side: Secondary | ICD-10-CM | POA: Diagnosis not present

## 2021-01-23 DIAGNOSIS — M79604 Pain in right leg: Secondary | ICD-10-CM | POA: Diagnosis not present

## 2021-01-23 DIAGNOSIS — M5432 Sciatica, left side: Secondary | ICD-10-CM | POA: Diagnosis not present

## 2021-01-23 DIAGNOSIS — I779 Disorder of arteries and arterioles, unspecified: Secondary | ICD-10-CM

## 2021-01-23 DIAGNOSIS — G890 Central pain syndrome: Secondary | ICD-10-CM | POA: Diagnosis not present

## 2021-01-23 DIAGNOSIS — M545 Low back pain, unspecified: Secondary | ICD-10-CM | POA: Diagnosis not present

## 2021-01-24 DIAGNOSIS — J9621 Acute and chronic respiratory failure with hypoxia: Secondary | ICD-10-CM | POA: Diagnosis not present

## 2021-01-24 DIAGNOSIS — G894 Chronic pain syndrome: Secondary | ICD-10-CM | POA: Diagnosis not present

## 2021-01-24 DIAGNOSIS — Z79891 Long term (current) use of opiate analgesic: Secondary | ICD-10-CM | POA: Diagnosis not present

## 2021-01-24 DIAGNOSIS — G8929 Other chronic pain: Secondary | ICD-10-CM | POA: Diagnosis not present

## 2021-01-24 DIAGNOSIS — J441 Chronic obstructive pulmonary disease with (acute) exacerbation: Secondary | ICD-10-CM | POA: Diagnosis not present

## 2021-02-02 ENCOUNTER — Other Ambulatory Visit: Payer: Self-pay | Admitting: Family Medicine

## 2021-02-14 ENCOUNTER — Encounter: Payer: Self-pay | Admitting: *Deleted

## 2021-02-21 DIAGNOSIS — G8929 Other chronic pain: Secondary | ICD-10-CM | POA: Diagnosis not present

## 2021-02-21 DIAGNOSIS — M545 Low back pain, unspecified: Secondary | ICD-10-CM | POA: Diagnosis not present

## 2021-02-21 DIAGNOSIS — M79605 Pain in left leg: Secondary | ICD-10-CM | POA: Diagnosis not present

## 2021-02-21 DIAGNOSIS — M5431 Sciatica, right side: Secondary | ICD-10-CM | POA: Diagnosis not present

## 2021-02-21 DIAGNOSIS — M5432 Sciatica, left side: Secondary | ICD-10-CM | POA: Diagnosis not present

## 2021-02-21 DIAGNOSIS — Z79891 Long term (current) use of opiate analgesic: Secondary | ICD-10-CM | POA: Diagnosis not present

## 2021-02-21 DIAGNOSIS — M79604 Pain in right leg: Secondary | ICD-10-CM | POA: Diagnosis not present

## 2021-02-21 DIAGNOSIS — G890 Central pain syndrome: Secondary | ICD-10-CM | POA: Diagnosis not present

## 2021-02-21 DIAGNOSIS — G894 Chronic pain syndrome: Secondary | ICD-10-CM | POA: Diagnosis not present

## 2021-02-22 ENCOUNTER — Other Ambulatory Visit: Payer: Self-pay | Admitting: Family Medicine

## 2021-02-23 DIAGNOSIS — J441 Chronic obstructive pulmonary disease with (acute) exacerbation: Secondary | ICD-10-CM | POA: Diagnosis not present

## 2021-02-23 DIAGNOSIS — J9621 Acute and chronic respiratory failure with hypoxia: Secondary | ICD-10-CM | POA: Diagnosis not present

## 2021-03-06 ENCOUNTER — Encounter (HOSPITAL_COMMUNITY): Payer: Medicare Other

## 2021-03-06 ENCOUNTER — Other Ambulatory Visit (HOSPITAL_COMMUNITY): Payer: Medicare Other

## 2021-03-09 ENCOUNTER — Ambulatory Visit: Payer: Medicare Other

## 2021-03-20 DIAGNOSIS — Z79891 Long term (current) use of opiate analgesic: Secondary | ICD-10-CM | POA: Diagnosis not present

## 2021-03-20 DIAGNOSIS — G894 Chronic pain syndrome: Secondary | ICD-10-CM | POA: Diagnosis not present

## 2021-03-20 DIAGNOSIS — M5432 Sciatica, left side: Secondary | ICD-10-CM | POA: Diagnosis not present

## 2021-03-20 DIAGNOSIS — M79605 Pain in left leg: Secondary | ICD-10-CM | POA: Diagnosis not present

## 2021-03-20 DIAGNOSIS — M545 Low back pain, unspecified: Secondary | ICD-10-CM | POA: Diagnosis not present

## 2021-03-20 DIAGNOSIS — M5431 Sciatica, right side: Secondary | ICD-10-CM | POA: Diagnosis not present

## 2021-03-20 DIAGNOSIS — M79604 Pain in right leg: Secondary | ICD-10-CM | POA: Diagnosis not present

## 2021-03-26 DIAGNOSIS — J441 Chronic obstructive pulmonary disease with (acute) exacerbation: Secondary | ICD-10-CM | POA: Diagnosis not present

## 2021-03-26 DIAGNOSIS — J9621 Acute and chronic respiratory failure with hypoxia: Secondary | ICD-10-CM | POA: Diagnosis not present

## 2021-03-30 ENCOUNTER — Other Ambulatory Visit: Payer: Self-pay | Admitting: Vascular Surgery

## 2021-03-30 ENCOUNTER — Telehealth: Payer: Self-pay

## 2021-03-30 DIAGNOSIS — I779 Disorder of arteries and arterioles, unspecified: Secondary | ICD-10-CM

## 2021-03-30 NOTE — Chronic Care Management (AMB) (Signed)
    Chronic Care Management Pharmacy Assistant   Name: Christopher Pena  MRN: [id number] DOB: 1945/10/18   Reason for Encounter: Disease State Medication Coordination call    Medications: Outpatient Encounter Medications as of 03/30/2021  Medication Sig Note   albuterol (VENTOLIN HFA) 108 (90 Base) MCG/ACT inhaler Inhale 2 puffs into the lungs every 6 (six) hours as needed for wheezing or shortness of breath.    aspirin EC 81 MG EC tablet Take 1 tablet (81 mg total) by mouth daily at 6 (six) AM. Swallow whole.    busPIRone (BUSPAR) 7.5 MG tablet TAKE 1 TABLET BY MOUTH TWICE A DAY (Patient taking differently: Take 7.5 mg by mouth 2 (two) times daily.)    cilostazol (PLETAL) 100 MG tablet TAKE ONE TABLET BY MOUTH TWICE DAILY BEFORE MEALS (Patient taking differently: Take 100 mg by mouth 2 (two) times daily. BEFORE MEALS) 12/07/2020: New RX   clopidogrel (PLAVIX) 75 MG tablet Take 1 tablet (75 mg total) by mouth daily.    gabapentin (NEURONTIN) 300 MG capsule Take 1 capsule by mouth every 8 (eight) hours.    ipratropium-albuterol (DUONEB) 0.5-2.5 (3) MG/3ML SOLN TAKE THREE MLS VIA NEBULIZER EVERY 6 HOURS AS NEEDED (Patient taking differently: Inhale 3 mLs into the lungs every 6 (six) hours as needed (shortness of breath).)    oxyCODONE (ROXICODONE) 15 MG immediate release tablet Take 15 mg by mouth every 4 (four) hours. 12/07/2020: LF on 11-29-20 #180 DS 30   rosuvastatin (CRESTOR) 20 MG tablet Take 1 tablet (20 mg total) by mouth daily.    sertraline (ZOLOFT) 25 MG tablet Take 1 tablet (25 mg total) by mouth daily.    No facility-administered encounter medications on file as of 03/30/2021.    BP Readings from Last 3 Encounters:  12/10/20 116/76  08/08/20 124/88  09/06/19 (!) 142/84    Lab Results  Component Value Date   HGBA1C 5.7 (H) 12/17/2018     Patient obtains medications through Vials  90 Days    Unsuccessful attempts to reach patient for his medication coordination call. I have  Attempted 2x and left 2 voicemail's to return my phone call as well.   Rance Muir, RMA Health Concierge

## 2021-04-10 ENCOUNTER — Encounter (HOSPITAL_COMMUNITY): Payer: Medicare Other

## 2021-04-10 ENCOUNTER — Other Ambulatory Visit (HOSPITAL_COMMUNITY): Payer: Medicare Other

## 2021-04-10 ENCOUNTER — Ambulatory Visit: Payer: Medicare Other

## 2021-04-17 DIAGNOSIS — G894 Chronic pain syndrome: Secondary | ICD-10-CM | POA: Diagnosis not present

## 2021-04-17 DIAGNOSIS — Z79891 Long term (current) use of opiate analgesic: Secondary | ICD-10-CM | POA: Diagnosis not present

## 2021-04-17 DIAGNOSIS — M79604 Pain in right leg: Secondary | ICD-10-CM | POA: Diagnosis not present

## 2021-04-17 DIAGNOSIS — M5431 Sciatica, right side: Secondary | ICD-10-CM | POA: Diagnosis not present

## 2021-04-17 DIAGNOSIS — M5432 Sciatica, left side: Secondary | ICD-10-CM | POA: Diagnosis not present

## 2021-04-17 DIAGNOSIS — M545 Low back pain, unspecified: Secondary | ICD-10-CM | POA: Diagnosis not present

## 2021-04-17 DIAGNOSIS — M79605 Pain in left leg: Secondary | ICD-10-CM | POA: Diagnosis not present

## 2021-04-26 DIAGNOSIS — J9621 Acute and chronic respiratory failure with hypoxia: Secondary | ICD-10-CM | POA: Diagnosis not present

## 2021-04-26 DIAGNOSIS — J441 Chronic obstructive pulmonary disease with (acute) exacerbation: Secondary | ICD-10-CM | POA: Diagnosis not present

## 2021-05-01 ENCOUNTER — Telehealth: Payer: Self-pay

## 2021-05-01 NOTE — Progress Notes (Signed)
    Chronic Care Management Pharmacy Assistant   Name: Christopher Pena  MRN: [id number] DOB: 01/29/1946   Reason for Encounter: Disease State - General Adherence Call / Schedule CPP Follow up    Recent office visits:  None noted.   Recent consult visits:  None noted.   Hospital visits: 12/06/20 - 12/10/20  Medication Reconciliation was completed by comparing discharge summary, patient's EMR and Pharmacy list, and upon discussion with patient.   Admitted to the hospital on 12/06/2020 due to Arterial occlusion, lower extremity . Discharge date was 12/10/2020. Discharged from [facility]     Started rosuvastatin 20 mg and Aspirin 81 mg.   All other medications  remain the same.     Medications: Outpatient Encounter Medications as of 05/01/2021  Medication Sig Note   albuterol (VENTOLIN HFA) 108 (90 Base) MCG/ACT inhaler Inhale 2 puffs into the lungs every 6 (six) hours as needed for wheezing or shortness of breath.    aspirin EC 81 MG EC tablet Take 1 tablet (81 mg total) by mouth daily at 6 (six) AM. Swallow whole.    busPIRone (BUSPAR) 7.5 MG tablet TAKE 1 TABLET BY MOUTH TWICE A DAY (Patient taking differently: Take 7.5 mg by mouth 2 (two) times daily.)    cilostazol (PLETAL) 100 MG tablet TAKE ONE TABLET BY MOUTH TWICE DAILY BEFORE MEALS (Patient taking differently: Take 100 mg by mouth 2 (two) times daily. BEFORE MEALS) 12/07/2020: New RX   clopidogrel (PLAVIX) 75 MG tablet Take 1 tablet (75 mg total) by mouth daily.    gabapentin (NEURONTIN) 300 MG capsule Take 1 capsule by mouth every 8 (eight) hours.    ipratropium-albuterol (DUONEB) 0.5-2.5 (3) MG/3ML SOLN TAKE THREE MLS VIA NEBULIZER EVERY 6 HOURS AS NEEDED (Patient taking differently: Inhale 3 mLs into the lungs every 6 (six) hours as needed (shortness of breath).)    oxyCODONE (ROXICODONE) 15 MG immediate release tablet Take 15 mg by mouth every 4 (four) hours. 12/07/2020: LF on 11-29-20 #180 DS 30    rosuvastatin (CRESTOR) 20 MG tablet Take 1 tablet (20 mg total) by mouth daily.    sertraline (ZOLOFT) 25 MG tablet Take 1 tablet (25 mg total) by mouth daily.    No facility-administered encounter medications on file as of 05/01/2021.   Have you had any problems recently with your health?   Have you had any problems with your pharmacy?   What issues or side effects are you having with your medications?   What would you like me to pass along to Dahlia Byes, CPP for them to help you with?    What can we do to take care of you better?   Care Gaps  AWV: overdue  Colonoscopy: never done  DM Eye Exam: N/A DEXA: never Mammogram: N/A   Star Rating Drugs: Rosuvastatin (CRESTOR) 20 MG tablet - last filled 03/22/21 90 days   No future appointments.  *Multiple unsuccessful attempts were made to reach patient. Chart review completed.  Eugenie Filler, CCMA Clinical Pharmacist Assistant  [phone]   Eugenie Filler, [facility] Clinical Pharmacist Assistant  [phone]  Time Spent: 22 minutes

## 2021-05-15 DIAGNOSIS — M5431 Sciatica, right side: Secondary | ICD-10-CM | POA: Diagnosis not present

## 2021-05-15 DIAGNOSIS — G890 Central pain syndrome: Secondary | ICD-10-CM | POA: Diagnosis not present

## 2021-05-15 DIAGNOSIS — M5432 Sciatica, left side: Secondary | ICD-10-CM | POA: Diagnosis not present

## 2021-05-15 DIAGNOSIS — M79604 Pain in right leg: Secondary | ICD-10-CM | POA: Diagnosis not present

## 2021-05-15 DIAGNOSIS — Z79891 Long term (current) use of opiate analgesic: Secondary | ICD-10-CM | POA: Diagnosis not present

## 2021-05-15 DIAGNOSIS — M545 Low back pain, unspecified: Secondary | ICD-10-CM | POA: Diagnosis not present

## 2021-05-15 DIAGNOSIS — M79605 Pain in left leg: Secondary | ICD-10-CM | POA: Diagnosis not present

## 2021-05-15 DIAGNOSIS — G894 Chronic pain syndrome: Secondary | ICD-10-CM | POA: Diagnosis not present

## 2021-05-26 DIAGNOSIS — J441 Chronic obstructive pulmonary disease with (acute) exacerbation: Secondary | ICD-10-CM | POA: Diagnosis not present

## 2021-05-26 DIAGNOSIS — J9621 Acute and chronic respiratory failure with hypoxia: Secondary | ICD-10-CM | POA: Diagnosis not present

## 2021-06-04 ENCOUNTER — Telehealth: Payer: Self-pay

## 2021-06-04 NOTE — Progress Notes (Signed)
    Chronic Care Management Pharmacy Assistant   Name: Christopher Pena  MRN: [id number] DOB: Nov 23, 1945   Reason for Encounter: Disease State - General Adherence Call    Recent office visits:  None noted.   Recent consult visits:  None noted.   Hospital visits: 12/06/20 - 12/10/20   Medication Reconciliation was completed by comparing discharge summary, patient's EMR and Pharmacy list, and upon discussion with patient.   Admitted to the hospital on 12/06/2020 due to Arterial occlusion, lower extremity . Discharge date was 12/10/2020. Discharged from [facility].     Started rosuvastatin 20 mg and Aspirin 81 mg.   All other medications  remain the same.  Medications: Outpatient Encounter Medications as of 06/04/2021  Medication Sig Note   albuterol (VENTOLIN HFA) 108 (90 Base) MCG/ACT inhaler Inhale 2 puffs into the lungs every 6 (six) hours as needed for wheezing or shortness of breath.    aspirin EC 81 MG EC tablet Take 1 tablet (81 mg total) by mouth daily at 6 (six) AM. Swallow whole.    busPIRone (BUSPAR) 7.5 MG tablet TAKE 1 TABLET BY MOUTH TWICE A DAY (Patient taking differently: Take 7.5 mg by mouth 2 (two) times daily.)    cilostazol (PLETAL) 100 MG tablet TAKE ONE TABLET BY MOUTH TWICE DAILY BEFORE MEALS (Patient taking differently: Take 100 mg by mouth 2 (two) times daily. BEFORE MEALS) 12/07/2020: New RX   clopidogrel (PLAVIX) 75 MG tablet Take 1 tablet (75 mg total) by mouth daily.    gabapentin (NEURONTIN) 300 MG capsule Take 1 capsule by mouth every 8 (eight) hours.    ipratropium-albuterol (DUONEB) 0.5-2.5 (3) MG/3ML SOLN TAKE THREE MLS VIA NEBULIZER EVERY 6 HOURS AS NEEDED (Patient taking differently: Inhale 3 mLs into the lungs every 6 (six) hours as needed (shortness of breath).)    oxyCODONE (ROXICODONE) 15 MG immediate release tablet Take 15 mg by mouth every 4 (four) hours. 12/07/2020: LF on 11-29-20 #180 DS 30   rosuvastatin (CRESTOR) 20 MG  tablet Take 1 tablet (20 mg total) by mouth daily.    sertraline (ZOLOFT) 25 MG tablet Take 1 tablet (25 mg total) by mouth daily.    No facility-administered encounter medications on file as of 06/04/2021.    Have you had any problems recently with your health? Patient reported  Have you had any problems with your pharmacy? Patient reported  What issues or side effects are you having with your medications? Patient   What would you like me to pass along to Dahlia Byes, CPP for them to help you with?  Patient reported  What can we do to take care of you better? Patient reported  Care Gaps  AWV: overdue Colonoscopy: unknown DM Eye Exam: N/A DM Foot Exam: N/A Microalbumin: N/A HbgAIC: N/A DEXA: N/A Mammogram: N/A   Star Rating Drugs: Rosuvastatin (CRESTOR) 20 MG tablet - last filled 03/22/21 90 days   No future appointments.   Multiple attempts were made to contact patient. Attempts were unsuccessful. / ls,CMA    Eugenie Filler, CCMA Clinical Pharmacist Assistant  [phone]  Time Spent: 27 minutes

## 2021-06-12 DIAGNOSIS — M5431 Sciatica, right side: Secondary | ICD-10-CM | POA: Diagnosis not present

## 2021-06-12 DIAGNOSIS — M79605 Pain in left leg: Secondary | ICD-10-CM | POA: Diagnosis not present

## 2021-06-12 DIAGNOSIS — Z79891 Long term (current) use of opiate analgesic: Secondary | ICD-10-CM | POA: Diagnosis not present

## 2021-06-12 DIAGNOSIS — G894 Chronic pain syndrome: Secondary | ICD-10-CM | POA: Diagnosis not present

## 2021-06-12 DIAGNOSIS — M5432 Sciatica, left side: Secondary | ICD-10-CM | POA: Diagnosis not present

## 2021-06-12 DIAGNOSIS — G890 Central pain syndrome: Secondary | ICD-10-CM | POA: Diagnosis not present

## 2021-06-12 DIAGNOSIS — M545 Low back pain, unspecified: Secondary | ICD-10-CM | POA: Diagnosis not present

## 2021-06-12 DIAGNOSIS — M79604 Pain in right leg: Secondary | ICD-10-CM | POA: Diagnosis not present

## 2021-06-20 ENCOUNTER — Telehealth: Payer: Self-pay

## 2021-06-20 NOTE — Progress Notes (Signed)
    Chronic Care Management Pharmacy Assistant   Name: Christopher Pena  MRN: [id number] DOB: February 27, 1946   Reason for Encounter: Disease State - General Adherence Call     Recent office visits:  None noted.   Recent consult visits:  None noted.   Hospital visits: 12/06/20 - 12/10/20   Medication Reconciliation was completed by comparing discharge summary, patient's EMR and Pharmacy list, and upon discussion with patient.   Admitted to the hospital on 12/06/2020 due to Arterial occlusion, lower extremity . Discharge date was 12/10/2020. Discharged from [facility]     Started rosuvastatin 20 mg and Aspirin 81 mg.   All other medications  remain the same.    Medications: Outpatient Encounter Medications as of 06/20/2021  Medication Sig Note   albuterol (VENTOLIN HFA) 108 (90 Base) MCG/ACT inhaler Inhale 2 puffs into the lungs every 6 (six) hours as needed for wheezing or shortness of breath.    aspirin EC 81 MG EC tablet Take 1 tablet (81 mg total) by mouth daily at 6 (six) AM. Swallow whole.    busPIRone (BUSPAR) 7.5 MG tablet TAKE 1 TABLET BY MOUTH TWICE A DAY (Patient taking differently: Take 7.5 mg by mouth 2 (two) times daily.)    cilostazol (PLETAL) 100 MG tablet TAKE ONE TABLET BY MOUTH TWICE DAILY BEFORE MEALS (Patient taking differently: Take 100 mg by mouth 2 (two) times daily. BEFORE MEALS) 12/07/2020: New RX   clopidogrel (PLAVIX) 75 MG tablet Take 1 tablet (75 mg total) by mouth daily.    gabapentin (NEURONTIN) 300 MG capsule Take 1 capsule by mouth every 8 (eight) hours.    ipratropium-albuterol (DUONEB) 0.5-2.5 (3) MG/3ML SOLN TAKE THREE MLS VIA NEBULIZER EVERY 6 HOURS AS NEEDED (Patient taking differently: Inhale 3 mLs into the lungs every 6 (six) hours as needed (shortness of breath).)    oxyCODONE (ROXICODONE) 15 MG immediate release tablet Take 15 mg by mouth every 4 (four) hours. 12/07/2020: LF on 11-29-20 #180 DS 30   rosuvastatin (CRESTOR) 20 MG  tablet Take 1 tablet (20 mg total) by mouth daily.    sertraline (ZOLOFT) 25 MG tablet Take 1 tablet (25 mg total) by mouth daily.    No facility-administered encounter medications on file as of 06/20/2021.    Have you had any problems recently with your health?   Have you had any problems with your pharmacy?   What issues or side effects are you having with your medications?   What would you like me to pass along to Christopher Pena, CPP for them to help you with?    What can we do to take care of you better?   Care Gaps   AWV: overdue Colonoscopy: unknown DM Eye Exam: N/A DM Foot Exam: N/A Microalbumin: N/A HbgAIC: N/A DEXA: N/A Mammogram: N/A   Star Rating Drugs: Rosuvastatin (CRESTOR) 20 MG tablet - last filled 03/22/21 90 days   No future appointments.  Eugenie Filler, CCMA Clinical Pharmacist Assistant  [phone]  Time Spent: 19 minutes  Multiple attempts were made to contact patient. Attempts were unsuccessful. / ls,CMA

## 2021-06-24 ENCOUNTER — Other Ambulatory Visit: Payer: Self-pay | Admitting: Family Medicine

## 2021-06-24 ENCOUNTER — Other Ambulatory Visit: Payer: Self-pay | Admitting: Surgery

## 2021-06-24 DIAGNOSIS — F419 Anxiety disorder, unspecified: Secondary | ICD-10-CM

## 2021-06-24 DIAGNOSIS — I779 Disorder of arteries and arterioles, unspecified: Secondary | ICD-10-CM

## 2021-06-26 DIAGNOSIS — J9621 Acute and chronic respiratory failure with hypoxia: Secondary | ICD-10-CM | POA: Diagnosis not present

## 2021-06-26 DIAGNOSIS — J441 Chronic obstructive pulmonary disease with (acute) exacerbation: Secondary | ICD-10-CM | POA: Diagnosis not present

## 2021-06-28 ENCOUNTER — Other Ambulatory Visit: Payer: Self-pay | Admitting: Surgery

## 2021-06-28 DIAGNOSIS — I779 Disorder of arteries and arterioles, unspecified: Secondary | ICD-10-CM

## 2021-06-29 ENCOUNTER — Telehealth: Payer: Self-pay

## 2021-06-29 NOTE — Progress Notes (Signed)
Chronic Care Management Pharmacy Assistant   Name: Christopher Pena  MRN: [id number] DOB: Oct 21, 1945   Reason for Encounter: Monthly Medication Coordination Call     Medications: Outpatient Encounter Medications as of 06/29/2021  Medication Sig Note   albuterol (VENTOLIN HFA) 108 (90 Base) MCG/ACT inhaler Inhale 2 puffs into the lungs every 6 (six) hours as needed for wheezing or shortness of breath.    aspirin EC 81 MG EC tablet Take 1 tablet (81 mg total) by mouth daily at 6 (six) AM. Swallow whole.    busPIRone (BUSPAR) 7.5 MG tablet TAKE 1 TABLET BY MOUTH TWICE A DAY (Patient taking differently: Take 7.5 mg by mouth 2 (two) times daily.)    cilostazol (PLETAL) 100 MG tablet TAKE ONE TABLET BY MOUTH TWICE DAILY BEFORE MEALS (Patient taking differently: Take 100 mg by mouth 2 (two) times daily. BEFORE MEALS) 12/07/2020: New RX   gabapentin (NEURONTIN) 300 MG capsule Take 1 capsule by mouth every 8 (eight) hours.    ipratropium-albuterol (DUONEB) 0.5-2.5 (3) MG/3ML SOLN TAKE THREE MLS VIA NEBULIZER EVERY 6 HOURS AS NEEDED (Patient taking differently: Inhale 3 mLs into the lungs every 6 (six) hours as needed (shortness of breath).)    oxyCODONE (ROXICODONE) 15 MG immediate release tablet Take 15 mg by mouth every 4 (four) hours. 12/07/2020: LF on 11-29-20 #180 DS 30   rosuvastatin (CRESTOR) 20 MG tablet Take 1 tablet (20 mg total) by mouth daily.    sertraline (ZOLOFT) 25 MG tablet Take 1 tablet (25 mg total) by mouth daily.    No facility-administered encounter medications on file as of 06/29/2021.   Reviewed chart for medication changes ahead of medication coordination call.  No OVs, Consults, or hospital visits since last care coordination call/Pharmacist visit. (If appropriate, list visit date, provider name)  No medication changes indicated OR if recent visit, treatment plan here.  BP Readings from Last 3 Encounters:  12/10/20 116/76  08/08/20 124/88  09/06/19 (!) 142/84     Lab Results  Component Value Date   HGBA1C 5.7 (H) 12/17/2018     Patient obtains medications through Vials w/o Safety vials   90 Days   Last adherence delivery included: (medication name and frequency) Unclear if previous delivery was successful based off chart review of last CPA call on 04/04/21. (See below)  Unsuccessful attempts to reach patient for his medication coordination call. I have Attempted 2x and left 2 voicemail's to return my phone call as well.  (Myriam Carolin Coy, [facility]cierge)   Patient is due for next adherence delivery on: 07/11/21.   This delivery to include: Sertraline 25 mg 1 tablet daily Clopidogrel 75 mg 1 tablet daily  Docusate 100 mg 1 capsule daily Cliostazol 100 mg 1 tablet twice daily  Patient declined the following medications (meds) due to (reason)  Patient needs refills for: Sertraline 25 mg 1 tablet daily 90 days supply.  Confirmed delivery date of 07/11/21, advised patient that pharmacy will contact them the morning of delivery.   Care Gaps   AWV: overdue Colonoscopy: unknown DM Eye Exam: N/A DM Foot Exam: N/A Microalbumin: N/A HbgAIC: N/A DEXA: N/A Mammogram: N/A   Star Rating Drugs: Rosuvastatin (CRESTOR) 20 MG tablet - last filled 03/22/21 90 days   No future appointments.  Called patient and LM. Having hard time connecting with patient previous calls. Called daughter Efraim Kaufmann whom PHI release was signed and spoke to her. She stated she would call patient and ask him to return  my call to complete so we may get his medication delivery completed in a timely manner. Hopefully this will also help with future connection with patient.    07/02/21 Called patient again at [facility]. Never heard back from patient after speaking to daughter will send notification to pharmacy.   Christopher Pena, CCMA Clinical Pharmacist Assistant  [phone]  Time Spent:

## 2021-07-02 ENCOUNTER — Telehealth: Payer: Self-pay

## 2021-07-02 NOTE — Telephone Encounter (Signed)
Patient calls today to report ulcers on his leg. "Upper, knee, and down below." Speech very rapid. Would like to be seen for "five or ten minutes today." Advised patient that first thing Thursday was the first appointment and that he needed at least the ABI and Le art ultrasounds. Says, "he can't wait that long and I'll just have to get a new doctor." He missed the last 3 appts, but says he is going to try to come on Thursday for evaluation.

## 2021-07-04 ENCOUNTER — Other Ambulatory Visit: Payer: Self-pay | Admitting: Surgery

## 2021-07-04 ENCOUNTER — Other Ambulatory Visit: Payer: Self-pay | Admitting: Family Medicine

## 2021-07-04 DIAGNOSIS — F419 Anxiety disorder, unspecified: Secondary | ICD-10-CM

## 2021-07-04 DIAGNOSIS — I779 Disorder of arteries and arterioles, unspecified: Secondary | ICD-10-CM

## 2021-07-05 ENCOUNTER — Ambulatory Visit (HOSPITAL_COMMUNITY)
Admission: RE | Admit: 2021-07-05 | Discharge: 2021-07-05 | Disposition: A | Discharge status: Home / Self Care | Payer: Medicare Other | Source: Ambulatory Visit | Attending: Physician Assistant | Admitting: Physician Assistant

## 2021-07-05 ENCOUNTER — Other Ambulatory Visit: Payer: Self-pay

## 2021-07-05 ENCOUNTER — Other Ambulatory Visit: Payer: Self-pay | Admitting: Physician Assistant

## 2021-07-05 ENCOUNTER — Encounter (HOSPITAL_COMMUNITY): Payer: Self-pay | Admitting: Vascular Surgery

## 2021-07-05 ENCOUNTER — Encounter (HOSPITAL_COMMUNITY)
Admission: AD | Disposition: A | Discharge status: Home / Self Care | Payer: Self-pay | Source: Ambulatory Visit | Attending: Vascular Surgery

## 2021-07-05 ENCOUNTER — Inpatient Hospital Stay (HOSPITAL_COMMUNITY)
Admission: AD | Admit: 2021-07-05 | Discharge: 2021-07-12 | DRG: 253 | Disposition: A | Discharge status: Home Health | Payer: Medicare Other | Source: Ambulatory Visit | Attending: Vascular Surgery | Admitting: Vascular Surgery

## 2021-07-05 ENCOUNTER — Ambulatory Visit: Payer: Medicare Other | Admitting: Physician Assistant

## 2021-07-05 ENCOUNTER — Ambulatory Visit (INDEPENDENT_AMBULATORY_CARE_PROVIDER_SITE_OTHER)
Admission: RE | Admit: 2021-07-05 | Discharge: 2021-07-05 | Disposition: A | Discharge status: Home / Self Care | Payer: Medicare Other | Source: Ambulatory Visit | Attending: Physician Assistant | Admitting: Physician Assistant

## 2021-07-05 ENCOUNTER — Inpatient Hospital Stay (HOSPITAL_COMMUNITY): Payer: Medicare Other | Admitting: Anesthesiology

## 2021-07-05 ENCOUNTER — Telehealth: Payer: Self-pay

## 2021-07-05 ENCOUNTER — Other Ambulatory Visit: Payer: Self-pay | Admitting: Family Medicine

## 2021-07-05 VITALS — BP 126/74 | HR 114 | Temp 97.9°F | Resp 20 | Ht 67.0 in | Wt 161.6 lb

## 2021-07-05 DIAGNOSIS — I10 Essential (primary) hypertension: Secondary | ICD-10-CM | POA: Diagnosis not present

## 2021-07-05 DIAGNOSIS — Z8249 Family history of ischemic heart disease and other diseases of the circulatory system: Secondary | ICD-10-CM

## 2021-07-05 DIAGNOSIS — T827XXA Infection and inflammatory reaction due to other cardiac and vascular devices, implants and grafts, initial encounter: Secondary | ICD-10-CM | POA: Diagnosis not present

## 2021-07-05 DIAGNOSIS — Y712 Prosthetic and other implants, materials and accessory cardiovascular devices associated with adverse incidents: Secondary | ICD-10-CM | POA: Diagnosis present

## 2021-07-05 DIAGNOSIS — I714 Abdominal aortic aneurysm, without rupture, unspecified: Secondary | ICD-10-CM | POA: Diagnosis present

## 2021-07-05 DIAGNOSIS — T82392A Other mechanical complication of femoral arterial graft (bypass), initial encounter: Secondary | ICD-10-CM | POA: Diagnosis not present

## 2021-07-05 DIAGNOSIS — J449 Chronic obstructive pulmonary disease, unspecified: Secondary | ICD-10-CM | POA: Diagnosis present

## 2021-07-05 DIAGNOSIS — Z20822 Contact with and (suspected) exposure to covid-19: Secondary | ICD-10-CM | POA: Diagnosis present

## 2021-07-05 DIAGNOSIS — Z8349 Family history of other endocrine, nutritional and metabolic diseases: Secondary | ICD-10-CM | POA: Diagnosis not present

## 2021-07-05 DIAGNOSIS — F419 Anxiety disorder, unspecified: Secondary | ICD-10-CM | POA: Diagnosis present

## 2021-07-05 DIAGNOSIS — Z7902 Long term (current) use of antithrombotics/antiplatelets: Secondary | ICD-10-CM

## 2021-07-05 DIAGNOSIS — D62 Acute posthemorrhagic anemia: Secondary | ICD-10-CM | POA: Diagnosis not present

## 2021-07-05 DIAGNOSIS — I70322 Atherosclerosis of unspecified type of bypass graft(s) of the extremities with rest pain, left leg: Secondary | ICD-10-CM | POA: Diagnosis not present

## 2021-07-05 DIAGNOSIS — R739 Hyperglycemia, unspecified: Secondary | ICD-10-CM | POA: Diagnosis not present

## 2021-07-05 DIAGNOSIS — I70201 Unspecified atherosclerosis of native arteries of extremities, right leg: Secondary | ICD-10-CM | POA: Diagnosis present

## 2021-07-05 DIAGNOSIS — I70222 Atherosclerosis of native arteries of extremities with rest pain, left leg: Secondary | ICD-10-CM | POA: Diagnosis present

## 2021-07-05 DIAGNOSIS — Z833 Family history of diabetes mellitus: Secondary | ICD-10-CM

## 2021-07-05 DIAGNOSIS — I779 Disorder of arteries and arterioles, unspecified: Secondary | ICD-10-CM

## 2021-07-05 DIAGNOSIS — Z23 Encounter for immunization: Secondary | ICD-10-CM

## 2021-07-05 DIAGNOSIS — M545 Low back pain, unspecified: Secondary | ICD-10-CM | POA: Diagnosis not present

## 2021-07-05 DIAGNOSIS — Z87891 Personal history of nicotine dependence: Secondary | ICD-10-CM

## 2021-07-05 DIAGNOSIS — L03116 Cellulitis of left lower limb: Secondary | ICD-10-CM | POA: Diagnosis present

## 2021-07-05 DIAGNOSIS — Z881 Allergy status to other antibiotic agents status: Secondary | ICD-10-CM | POA: Diagnosis not present

## 2021-07-05 DIAGNOSIS — Z800 Family history of malignant neoplasm of digestive organs: Secondary | ICD-10-CM | POA: Diagnosis not present

## 2021-07-05 DIAGNOSIS — J9611 Chronic respiratory failure with hypoxia: Secondary | ICD-10-CM | POA: Diagnosis present

## 2021-07-05 DIAGNOSIS — E785 Hyperlipidemia, unspecified: Secondary | ICD-10-CM | POA: Diagnosis not present

## 2021-07-05 DIAGNOSIS — Z79899 Other long term (current) drug therapy: Secondary | ICD-10-CM | POA: Diagnosis not present

## 2021-07-05 DIAGNOSIS — J9621 Acute and chronic respiratory failure with hypoxia: Secondary | ICD-10-CM | POA: Diagnosis not present

## 2021-07-05 DIAGNOSIS — Z7982 Long term (current) use of aspirin: Secondary | ICD-10-CM

## 2021-07-05 DIAGNOSIS — G8929 Other chronic pain: Secondary | ICD-10-CM | POA: Diagnosis not present

## 2021-07-05 DIAGNOSIS — S80852A Superficial foreign body, left lower leg, initial encounter: Secondary | ICD-10-CM | POA: Diagnosis present

## 2021-07-05 DIAGNOSIS — T82898A Other specified complication of vascular prosthetic devices, implants and grafts, initial encounter: Secondary | ICD-10-CM | POA: Diagnosis not present

## 2021-07-05 DIAGNOSIS — Z9981 Dependence on supplemental oxygen: Secondary | ICD-10-CM

## 2021-07-05 DIAGNOSIS — I7143 Infrarenal abdominal aortic aneurysm, without rupture: Secondary | ICD-10-CM | POA: Diagnosis not present

## 2021-07-05 DIAGNOSIS — Z9889 Other specified postprocedural states: Secondary | ICD-10-CM | POA: Diagnosis not present

## 2021-07-05 DIAGNOSIS — T82868A Thrombosis of vascular prosthetic devices, implants and grafts, initial encounter: Secondary | ICD-10-CM | POA: Diagnosis present

## 2021-07-05 DIAGNOSIS — S81802A Unspecified open wound, left lower leg, initial encounter: Secondary | ICD-10-CM | POA: Diagnosis not present

## 2021-07-05 DIAGNOSIS — E43 Unspecified severe protein-calorie malnutrition: Secondary | ICD-10-CM | POA: Diagnosis not present

## 2021-07-05 HISTORY — PX: I & D EXTREMITY: SHX5045

## 2021-07-05 HISTORY — PX: REMOVAL OF GRAFT: SHX6361

## 2021-07-05 HISTORY — PX: VEIN HARVEST: SHX6363

## 2021-07-05 HISTORY — PX: PATCH ANGIOPLASTY: SHX6230

## 2021-07-05 LAB — SARS CORONAVIRUS 2 BY RT PCR (HOSPITAL ORDER, PERFORMED IN ~~LOC~~ HOSPITAL LAB): SARS Coronavirus 2: NEGATIVE

## 2021-07-05 LAB — POCT I-STAT 7, (LYTES, BLD GAS, ICA,H+H)
Acid-Base Excess: 0 mmol/L (ref 0.0–2.0)
Bicarbonate: 26.1 mmol/L (ref 20.0–28.0)
Calcium, Ion: 1.09 mmol/L — ABNORMAL LOW (ref 1.15–1.40)
HCT: 28 % — ABNORMAL LOW (ref 39.0–52.0)
Hemoglobin: 9.5 g/dL — ABNORMAL LOW (ref 13.0–17.0)
O2 Saturation: 93 %
Patient temperature: 98.1
Potassium: 4.1 mmol/L (ref 3.5–5.1)
Sodium: 135 mmol/L (ref 135–145)
TCO2: 28 mmol/L (ref 22–32)
pCO2 arterial: 47.9 mmHg (ref 32.0–48.0)
pH, Arterial: 7.343 — ABNORMAL LOW (ref 7.350–7.450)
pO2, Arterial: 71 mmHg — ABNORMAL LOW (ref 83.0–108.0)

## 2021-07-05 LAB — COMPREHENSIVE METABOLIC PANEL
ALT: 13 U/L (ref 0–44)
AST: 26 U/L (ref 15–41)
Albumin: 3 g/dL — ABNORMAL LOW (ref 3.5–5.0)
Alkaline Phosphatase: 64 U/L (ref 38–126)
Anion gap: 13 (ref 5–15)
BUN: 9 mg/dL (ref 8–23)
CO2: 26 mmol/L (ref 22–32)
Calcium: 8.3 mg/dL — ABNORMAL LOW (ref 8.9–10.3)
Chloride: 95 mmol/L — ABNORMAL LOW (ref 98–111)
Creatinine, Ser: 1 mg/dL (ref 0.61–1.24)
GFR, Estimated: >60 mL/min (ref >60)
Glucose, Bld: 146 mg/dL — ABNORMAL HIGH (ref 70–99)
Potassium: 3.3 mmol/L — ABNORMAL LOW (ref 3.5–5.1)
Sodium: 134 mmol/L — ABNORMAL LOW (ref 135–145)
Total Bilirubin: 0.6 mg/dL (ref 0.3–1.2)
Total Protein: 7.5 g/dL (ref 6.5–8.1)

## 2021-07-05 LAB — POCT I-STAT EG7
Acid-Base Excess: 2 mmol/L (ref 0.0–2.0)
Acid-Base Excess: 3 mmol/L — ABNORMAL HIGH (ref 0.0–2.0)
Bicarbonate: 30.6 mmol/L — ABNORMAL HIGH (ref 20.0–28.0)
Bicarbonate: 30.6 mmol/L — ABNORMAL HIGH (ref 20.0–28.0)
Calcium, Ion: 1.11 mmol/L — ABNORMAL LOW (ref 1.15–1.40)
Calcium, Ion: 1.12 mmol/L — ABNORMAL LOW (ref 1.15–1.40)
HCT: 33 % — ABNORMAL LOW (ref 39.0–52.0)
HCT: 33 % — ABNORMAL LOW (ref 39.0–52.0)
Hemoglobin: 11.2 g/dL — ABNORMAL LOW (ref 13.0–17.0)
Hemoglobin: 11.2 g/dL — ABNORMAL LOW (ref 13.0–17.0)
O2 Saturation: 49 %
O2 Saturation: 53 %
Patient temperature: 35.7
Patient temperature: 35.7
Potassium: 3.9 mmol/L (ref 3.5–5.1)
Potassium: 3.9 mmol/L (ref 3.5–5.1)
Sodium: 137 mmol/L (ref 135–145)
Sodium: 137 mmol/L (ref 135–145)
TCO2: 33 mmol/L — ABNORMAL HIGH (ref 22–32)
TCO2: 33 mmol/L — ABNORMAL HIGH (ref 22–32)
pCO2, Ven: 61.5 mmHg — ABNORMAL HIGH (ref 44.0–60.0)
pCO2, Ven: 61.8 mmHg — ABNORMAL HIGH (ref 44.0–60.0)
pH, Ven: 7.297 (ref 7.250–7.430)
pH, Ven: 7.298 (ref 7.250–7.430)
pO2, Ven: 28 mmHg — CL (ref 32.0–45.0)
pO2, Ven: 30 mmHg — CL (ref 32.0–45.0)

## 2021-07-05 LAB — CBC
HCT: 33.8 % — ABNORMAL LOW (ref 39.0–52.0)
Hemoglobin: 10.4 g/dL — ABNORMAL LOW (ref 13.0–17.0)
MCH: 25.7 pg — ABNORMAL LOW (ref 26.0–34.0)
MCHC: 30.8 g/dL (ref 30.0–36.0)
MCV: 83.7 fL (ref 80.0–100.0)
Platelets: 406 10*3/uL — ABNORMAL HIGH (ref 150–400)
RBC: 4.04 MIL/uL — ABNORMAL LOW (ref 4.22–5.81)
RDW: 14.7 % (ref 11.5–15.5)
WBC: 12.4 10*3/uL — ABNORMAL HIGH (ref 4.0–10.5)
nRBC: 0 % (ref 0.0–0.2)

## 2021-07-05 LAB — PROTIME-INR
INR: 1.2 (ref 0.8–1.2)
Prothrombin Time: 15.3 seconds — ABNORMAL HIGH (ref 11.4–15.2)

## 2021-07-05 SURGERY — IRRIGATION AND DEBRIDEMENT EXTREMITY
Anesthesia: General | Site: Leg Lower | Laterality: Left

## 2021-07-05 MED ORDER — VANCOMYCIN HCL 1500 MG/300ML IV SOLN
1500.0000 mg | Freq: Once | INTRAVENOUS | Status: DC
  Filled 2021-07-05: qty 300

## 2021-07-05 MED ORDER — CILOSTAZOL 50 MG PO TABS
100.0000 mg | ORAL_TABLET | Freq: Every day | ORAL | Status: DC
  Administered 2021-07-06 – 2021-07-11 (×6): 100 mg via ORAL
  Filled 2021-07-05 (×6): qty 2

## 2021-07-05 MED ORDER — ROCURONIUM BROMIDE 10 MG/ML (PF) SYRINGE
PREFILLED_SYRINGE | INTRAVENOUS | Status: DC | PRN
  Administered 2021-07-05: 50 mg via INTRAVENOUS

## 2021-07-05 MED ORDER — ALBUTEROL SULFATE (2.5 MG/3ML) 0.083% IN NEBU: 2.5000 mg | INHALATION_SOLUTION | Freq: Four times a day (QID) | RESPIRATORY_TRACT | Status: DC | PRN

## 2021-07-05 MED ORDER — HEPARIN 6000 UNIT IRRIGATION SOLUTION
Status: DC | PRN
  Administered 2021-07-05: 1

## 2021-07-05 MED ORDER — PROTAMINE SULFATE 10 MG/ML IV SOLN
INTRAVENOUS | Status: DC | PRN
  Administered 2021-07-05: 50 mg via INTRAVENOUS

## 2021-07-05 MED ORDER — DOCUSATE SODIUM 100 MG PO CAPS
100.0000 mg | ORAL_CAPSULE | Freq: Every day | ORAL | Status: DC
  Administered 2021-07-06 – 2021-07-08 (×3): 100 mg via ORAL
  Filled 2021-07-05 (×7): qty 1

## 2021-07-05 MED ORDER — HYDROMORPHONE HCL 2 MG/ML IJ SOLN: 2.0000 mg | INTRAMUSCULAR | Status: DC | PRN

## 2021-07-05 MED ORDER — SENNOSIDES-DOCUSATE SODIUM 8.6-50 MG PO TABS: 1.0000 | ORAL_TABLET | Freq: Every evening | ORAL | Status: DC | PRN

## 2021-07-05 MED ORDER — HYDROMORPHONE HCL 1 MG/ML IJ SOLN: 0.5000 mg | INTRAMUSCULAR | Status: DC | PRN

## 2021-07-05 MED ORDER — SODIUM CHLORIDE 0.9 % IV SOLN: INTRAVENOUS | Status: DC

## 2021-07-05 MED ORDER — POTASSIUM CHLORIDE CRYS ER 20 MEQ PO TBCR: 20.0000 meq | EXTENDED_RELEASE_TABLET | Freq: Every day | ORAL | Status: DC | PRN

## 2021-07-05 MED ORDER — BISACODYL 5 MG PO TBEC: 5.0000 mg | DELAYED_RELEASE_TABLET | Freq: Every day | ORAL | Status: DC | PRN

## 2021-07-05 MED ORDER — ORAL CARE MOUTH RINSE: 15.0000 mL | Freq: Once | OROMUCOSAL | Status: AC

## 2021-07-05 MED ORDER — ACETAMINOPHEN 325 MG RE SUPP
325.0000 mg | RECTAL | Status: DC | PRN
  Filled 2021-07-05: qty 2

## 2021-07-05 MED ORDER — ACETAMINOPHEN 325 MG PO TABS: 325.0000 mg | ORAL_TABLET | ORAL | Status: DC | PRN

## 2021-07-05 MED ORDER — HEMOSTATIC AGENTS (NO CHARGE) OPTIME
TOPICAL | Status: DC | PRN
  Administered 2021-07-05 (×5): 1 via TOPICAL

## 2021-07-05 MED ORDER — ALUM & MAG HYDROXIDE-SIMETH 200-200-20 MG/5ML PO SUSP: 15.0000 mL | ORAL | Status: DC | PRN

## 2021-07-05 MED ORDER — ASPIRIN EC 81 MG PO TBEC
81.0000 mg | DELAYED_RELEASE_TABLET | Freq: Every day | ORAL | Status: DC
  Administered 2021-07-06 – 2021-07-12 (×7): 81 mg via ORAL
  Filled 2021-07-05 (×7): qty 1

## 2021-07-05 MED ORDER — FENTANYL CITRATE (PF) 250 MCG/5ML IJ SOLN
INTRAMUSCULAR | Status: DC | PRN
  Administered 2021-07-05: 50 ug via INTRAVENOUS
  Administered 2021-07-05: 100 ug via INTRAVENOUS
  Administered 2021-07-05 (×2): 25 ug via INTRAVENOUS

## 2021-07-05 MED ORDER — SODIUM CHLORIDE 0.9 % IV SOLN
INTRAVENOUS | Status: DC
  Filled 2021-07-05 (×2): qty 10

## 2021-07-05 MED ORDER — LABETALOL HCL 5 MG/ML IV SOLN: 10.0000 mg | INTRAVENOUS | Status: DC | PRN

## 2021-07-05 MED ORDER — LACTATED RINGERS IV SOLN: INTRAVENOUS | Status: DC

## 2021-07-05 MED ORDER — CHLORHEXIDINE GLUCONATE 0.12 % MT SOLN: 15.0000 mL | Freq: Once | OROMUCOSAL | Status: AC

## 2021-07-05 MED ORDER — SODIUM CHLORIDE 0.9 % IV SOLN
500.0000 mL | Freq: Once | INTRAVENOUS | Status: AC | PRN
  Administered 2021-07-06: 500 mL via INTRAVENOUS

## 2021-07-05 MED ORDER — ONDANSETRON HCL 4 MG/2ML IJ SOLN: 4.0000 mg | Freq: Four times a day (QID) | INTRAMUSCULAR | Status: DC | PRN

## 2021-07-05 MED ORDER — PHENYLEPHRINE HCL-NACL 20-0.9 MG/250ML-% IV SOLN
INTRAVENOUS | Status: DC | PRN
  Administered 2021-07-05: 25 ug/min via INTRAVENOUS

## 2021-07-05 MED ORDER — 0.9 % SODIUM CHLORIDE (POUR BTL) OPTIME
TOPICAL | Status: DC | PRN
  Administered 2021-07-05 (×2): 1000 mL

## 2021-07-05 MED ORDER — ALBUTEROL SULFATE HFA 108 (90 BASE) MCG/ACT IN AERS: 2.0000 | INHALATION_SPRAY | Freq: Four times a day (QID) | RESPIRATORY_TRACT | Status: DC | PRN

## 2021-07-05 MED ORDER — LIDOCAINE 2% (20 MG/ML) 5 ML SYRINGE
INTRAMUSCULAR | Status: DC | PRN
  Administered 2021-07-05: 60 mg via INTRAVENOUS

## 2021-07-05 MED ORDER — BUSPIRONE HCL 5 MG PO TABS
7.5000 mg | ORAL_TABLET | Freq: Two times a day (BID) | ORAL | Status: DC
  Administered 2021-07-06 – 2021-07-08 (×5): 7.5 mg via ORAL
  Filled 2021-07-05 (×6): qty 2

## 2021-07-05 MED ORDER — FENTANYL CITRATE (PF) 250 MCG/5ML IJ SOLN
INTRAMUSCULAR | Status: AC
  Filled 2021-07-05: qty 5

## 2021-07-05 MED ORDER — OXYCODONE-ACETAMINOPHEN 5-325 MG PO TABS: 1.0000 | ORAL_TABLET | ORAL | Status: DC | PRN

## 2021-07-05 MED ORDER — PHENYLEPHRINE 40 MCG/ML (10ML) SYRINGE FOR IV PUSH (FOR BLOOD PRESSURE SUPPORT)
PREFILLED_SYRINGE | INTRAVENOUS | Status: DC | PRN
  Administered 2021-07-05 (×2): 120 ug via INTRAVENOUS

## 2021-07-05 MED ORDER — MAGNESIUM SULFATE 2 GM/50ML IV SOLN: 2.0000 g | Freq: Every day | INTRAVENOUS | Status: DC | PRN

## 2021-07-05 MED ORDER — FENTANYL CITRATE (PF) 100 MCG/2ML IJ SOLN
INTRAMUSCULAR | Status: AC
  Filled 2021-07-05: qty 2

## 2021-07-05 MED ORDER — VANCOMYCIN HCL 1500 MG/300ML IV SOLN
1500.0000 mg | INTRAVENOUS | Status: DC
  Administered 2021-07-05 (×2): 1500 mg via INTRAVENOUS
  Filled 2021-07-05: qty 300

## 2021-07-05 MED ORDER — ONDANSETRON HCL 4 MG/2ML IJ SOLN
INTRAMUSCULAR | Status: DC | PRN
  Administered 2021-07-05: 4 mg via INTRAVENOUS

## 2021-07-05 MED ORDER — OXYCODONE HCL 5 MG PO TABS
15.0000 mg | ORAL_TABLET | ORAL | Status: DC
  Administered 2021-07-05 – 2021-07-12 (×38): 15 mg via ORAL
  Filled 2021-07-05 (×39): qty 3

## 2021-07-05 MED ORDER — PHENOL 1.4 % MT LIQD: 1.0000 | OROMUCOSAL | Status: DC | PRN

## 2021-07-05 MED ORDER — HYDRALAZINE HCL 20 MG/ML IJ SOLN: 5.0000 mg | INTRAMUSCULAR | Status: DC | PRN

## 2021-07-05 MED ORDER — GABAPENTIN 300 MG PO CAPS
300.0000 mg | ORAL_CAPSULE | Freq: Three times a day (TID) | ORAL | Status: DC
  Administered 2021-07-06 – 2021-07-09 (×7): 300 mg via ORAL
  Filled 2021-07-05 (×9): qty 1

## 2021-07-05 MED ORDER — PROPOFOL 10 MG/ML IV BOLUS
INTRAVENOUS | Status: DC | PRN
  Administered 2021-07-05: 130 mg via INTRAVENOUS

## 2021-07-05 MED ORDER — METOPROLOL TARTRATE 5 MG/5ML IV SOLN: 2.0000 mg | INTRAVENOUS | Status: AC | PRN

## 2021-07-05 MED ORDER — PIPERACILLIN-TAZOBACTAM 3.375 G IVPB
3.3750 g | Freq: Three times a day (TID) | INTRAVENOUS | Status: DC
  Administered 2021-07-05 – 2021-07-12 (×20): 3.375 g via INTRAVENOUS
  Filled 2021-07-05 (×21): qty 50

## 2021-07-05 MED ORDER — POTASSIUM CHLORIDE CRYS ER 20 MEQ PO TBCR: 20.0000 meq | EXTENDED_RELEASE_TABLET | Freq: Once | ORAL | Status: DC

## 2021-07-05 MED ORDER — HEPARIN SODIUM (PORCINE) 5000 UNIT/ML IJ SOLN
5000.0000 [IU] | Freq: Three times a day (TID) | INTRAMUSCULAR | Status: DC
  Administered 2021-07-06 – 2021-07-08 (×7): 5000 [IU] via SUBCUTANEOUS
  Filled 2021-07-05 (×6): qty 1

## 2021-07-05 MED ORDER — SERTRALINE HCL 25 MG PO TABS: 25.0000 mg | ORAL_TABLET | Freq: Every day | ORAL | 2 refills | Status: DC

## 2021-07-05 MED ORDER — HEPARIN SODIUM (PORCINE) 1000 UNIT/ML IJ SOLN
INTRAMUSCULAR | Status: DC | PRN
  Administered 2021-07-05: 5000 [IU] via INTRAVENOUS
  Administered 2021-07-05 (×3): 3000 [IU] via INTRAVENOUS

## 2021-07-05 MED ORDER — ACETAMINOPHEN 10 MG/ML IV SOLN
INTRAVENOUS | Status: AC
  Filled 2021-07-05: qty 100

## 2021-07-05 MED ORDER — ROSUVASTATIN CALCIUM 20 MG PO TABS
20.0000 mg | ORAL_TABLET | Freq: Every day | ORAL | Status: DC
  Administered 2021-07-06 – 2021-07-12 (×7): 20 mg via ORAL
  Filled 2021-07-05 (×7): qty 1

## 2021-07-05 MED ORDER — PIPERACILLIN-TAZOBACTAM 3.375 G IVPB
3.3750 g | Freq: Three times a day (TID) | INTRAVENOUS | Status: DC
  Filled 2021-07-05 (×2): qty 50

## 2021-07-05 MED ORDER — ACETAMINOPHEN 10 MG/ML IV SOLN
1000.0000 mg | Freq: Once | INTRAVENOUS | Status: DC | PRN
  Administered 2021-07-05: 23:00:00 1000 mg via INTRAVENOUS

## 2021-07-05 MED ORDER — HEPARIN 6000 UNIT IRRIGATION SOLUTION
Status: AC
  Filled 2021-07-05: qty 500

## 2021-07-05 MED ORDER — SERTRALINE HCL 25 MG PO TABS
25.0000 mg | ORAL_TABLET | Freq: Every day | ORAL | Status: DC
  Administered 2021-07-06 – 2021-07-12 (×7): 25 mg via ORAL
  Filled 2021-07-05 (×7): qty 1

## 2021-07-05 MED ORDER — IPRATROPIUM-ALBUTEROL 0.5-2.5 (3) MG/3ML IN SOLN: 3.0000 mL | Freq: Four times a day (QID) | RESPIRATORY_TRACT | Status: DC | PRN

## 2021-07-05 MED ORDER — CHLORHEXIDINE GLUCONATE 0.12 % MT SOLN
OROMUCOSAL | Status: AC
  Administered 2021-07-05: 17:00:00 15 mL via OROMUCOSAL
  Filled 2021-07-05: qty 15

## 2021-07-05 MED ORDER — SODIUM CHLORIDE 0.9 % IV SOLN
INTRAVENOUS | Status: DC | PRN
  Administered 2021-07-05: 22:00:00 500 mL

## 2021-07-05 MED ORDER — FENTANYL CITRATE (PF) 100 MCG/2ML IJ SOLN
25.0000 ug | INTRAMUSCULAR | Status: DC | PRN
  Administered 2021-07-05: 23:00:00 50 ug via INTRAVENOUS
  Administered 2021-07-05 (×2): 25 ug via INTRAVENOUS
  Administered 2021-07-05: 50 ug via INTRAVENOUS

## 2021-07-05 MED ORDER — PANTOPRAZOLE SODIUM 40 MG PO TBEC
40.0000 mg | DELAYED_RELEASE_TABLET | Freq: Every day | ORAL | Status: DC
  Administered 2021-07-06 – 2021-07-12 (×7): 40 mg via ORAL
  Filled 2021-07-05 (×7): qty 1

## 2021-07-05 MED ORDER — SUGAMMADEX SODIUM 200 MG/2ML IV SOLN
INTRAVENOUS | Status: DC | PRN
  Administered 2021-07-05: 200 mg via INTRAVENOUS

## 2021-07-05 MED ORDER — DEXAMETHASONE SODIUM PHOSPHATE 10 MG/ML IJ SOLN
INTRAMUSCULAR | Status: DC | PRN
  Administered 2021-07-05: 5 mg via INTRAVENOUS

## 2021-07-05 MED ORDER — GUAIFENESIN-DM 100-10 MG/5ML PO SYRP: 15.0000 mL | ORAL_SOLUTION | ORAL | Status: DC | PRN

## 2021-07-05 SURGICAL SUPPLY — 83 items
BAG COUNTER SPONGE SURGICOUNT (BAG) ×2 IMPLANT
BAG SPNG CNTER NS LX DISP (BAG) ×1
BANDAGE ESMARK 6X9 LF (GAUZE/BANDAGES/DRESSINGS) ×1 IMPLANT
BNDG CMPR 9X6 STRL LF SNTH (GAUZE/BANDAGES/DRESSINGS) ×1
BNDG COHESIVE 6X5 TAN ST LF (GAUZE/BANDAGES/DRESSINGS) ×2 IMPLANT
BNDG ELASTIC 4X5.8 VLCR STR LF (GAUZE/BANDAGES/DRESSINGS) IMPLANT
BNDG ELASTIC 6X5.8 VLCR STR LF (GAUZE/BANDAGES/DRESSINGS) ×2 IMPLANT
BNDG ESMARK 6X9 LF (GAUZE/BANDAGES/DRESSINGS) ×2
BNDG GAUZE ELAST 4 BULKY (GAUZE/BANDAGES/DRESSINGS) ×2 IMPLANT
CANISTER SUCT 1200ML W/VALVE (MISCELLANEOUS) ×2 IMPLANT
CANISTER SUCT 3000ML PPV (MISCELLANEOUS) ×2 IMPLANT
CANNULA VESSEL 3MM 2 BLNT TIP (CANNULA) IMPLANT
CATH EMB 2FR 60CM (CATHETERS) ×2 IMPLANT
CLIP VESOCCLUDE MED 24/CT (CLIP) ×2 IMPLANT
CLIP VESOCCLUDE SM WIDE 24/CT (CLIP) ×2 IMPLANT
COVER SURGICAL LIGHT HANDLE (MISCELLANEOUS) ×4 IMPLANT
CUFF TOURN SGL QUICK 24 (TOURNIQUET CUFF) ×2
CUFF TRNQT CYL 24X4X16.5-23 (TOURNIQUET CUFF) IMPLANT
CUFF TRNQT CYL 24X4X40X1 (TOURNIQUET CUFF) ×1 IMPLANT
DERMABOND ADVANCED (GAUZE/BANDAGES/DRESSINGS) ×1
DERMABOND ADVANCED .7 DNX12 (GAUZE/BANDAGES/DRESSINGS) ×1 IMPLANT
DRAIN CHANNEL 15F RND FF W/TCR (WOUND CARE) IMPLANT
DRAPE ORTHO SPLIT 77X108 STRL (DRAPES)
DRAPE SURG ORHT 6 SPLT 77X108 (DRAPES) IMPLANT
DRAPE X-RAY CASS 24X20 (DRAPES) IMPLANT
DRSG PAD ABDOMINAL 8X10 ST (GAUZE/BANDAGES/DRESSINGS) ×2 IMPLANT
ELECT REM PT RETURN 9FT ADLT (ELECTROSURGICAL) ×2
ELECTRODE REM PT RTRN 9FT ADLT (ELECTROSURGICAL) ×1 IMPLANT
EVACUATOR SILICONE 100CC (DRAIN) IMPLANT
GAUZE PACKING IODOFORM 1X5 (PACKING) ×2 IMPLANT
GAUZE SPONGE 4X4 12PLY STRL (GAUZE/BANDAGES/DRESSINGS) ×2 IMPLANT
GAUZE XEROFORM 1X8 LF (GAUZE/BANDAGES/DRESSINGS) ×2 IMPLANT
GLOVE SRG 8 PF TXTR STRL LF DI (GLOVE) ×2 IMPLANT
GLOVE SURG POLYISO LF SZ7.5 (GLOVE) ×2 IMPLANT
GLOVE SURG POLYISO LF SZ8 (GLOVE) IMPLANT
GLOVE SURG UNDER POLY LF SZ7.5 (GLOVE) ×2 IMPLANT
GLOVE SURG UNDER POLY LF SZ8 (GLOVE) ×4
GOWN STRL REUS W/ TWL LRG LVL3 (GOWN DISPOSABLE) ×3 IMPLANT
GOWN STRL REUS W/TWL 2XL LVL3 (GOWN DISPOSABLE) ×2 IMPLANT
GOWN STRL REUS W/TWL LRG LVL3 (GOWN DISPOSABLE) ×6
HEMOSTAT SNOW SURGICEL 2X4 (HEMOSTASIS) ×10 IMPLANT
INSERT FOGARTY SM (MISCELLANEOUS) IMPLANT
KIT BASIN OR (CUSTOM PROCEDURE TRAY) ×2 IMPLANT
KIT TURNOVER KIT B (KITS) ×2 IMPLANT
MARKER GRAFT CORONARY BYPASS (MISCELLANEOUS) IMPLANT
NS IRRIG 1000ML POUR BTL (IV SOLUTION) ×4 IMPLANT
PACK CV ACCESS (CUSTOM PROCEDURE TRAY) ×2 IMPLANT
PACK GENERAL/GYN (CUSTOM PROCEDURE TRAY) IMPLANT
PACK PERIPHERAL VASCULAR (CUSTOM PROCEDURE TRAY) ×2 IMPLANT
PAD ARMBOARD 7.5X6 YLW CONV (MISCELLANEOUS) ×4 IMPLANT
SET COLLECT BLD 21X3/4 12 (NEEDLE) IMPLANT
SHEET MEDIUM DRAPE 40X70 STRL (DRAPES) IMPLANT
SPONGE T-LAP 18X18 ~~LOC~~+RFID (SPONGE) ×8 IMPLANT
STAPLER VISISTAT 35W (STAPLE) ×4 IMPLANT
STOPCOCK 4 WAY LG BORE MALE ST (IV SETS) IMPLANT
SUT ETHILON 2 0 PSLX (SUTURE) ×4 IMPLANT
SUT ETHILON 3 0 PS 1 (SUTURE) IMPLANT
SUT GORETEX 6.0 TT13 (SUTURE) IMPLANT
SUT GORETEX 6.0 TT9 (SUTURE) IMPLANT
SUT MNCRL AB 4-0 PS2 18 (SUTURE) IMPLANT
SUT PROLENE 5 0 C 1 24 (SUTURE) ×2 IMPLANT
SUT PROLENE 6 0 BV (SUTURE) ×2 IMPLANT
SUT PROLENE 7 0 BV 1 (SUTURE) IMPLANT
SUT SILK 2 0 SH (SUTURE) ×2 IMPLANT
SUT SILK 2 0SH CR/8 30 (SUTURE) ×2 IMPLANT
SUT SILK 3 0 (SUTURE)
SUT SILK 3-0 18XBRD TIE 12 (SUTURE) IMPLANT
SUT VIC AB 2-0 CT1 27 (SUTURE) ×4
SUT VIC AB 2-0 CT1 TAPERPNT 27 (SUTURE) ×2 IMPLANT
SUT VIC AB 3-0 SH 27 (SUTURE) ×4
SUT VIC AB 3-0 SH 27X BRD (SUTURE) ×2 IMPLANT
SUT VICRYL 4-0 PS2 18IN ABS (SUTURE) ×4 IMPLANT
SWAB COLLECTION DEVICE MRSA (MISCELLANEOUS) ×2 IMPLANT
SWAB CULTURE ESWAB REG 1ML (MISCELLANEOUS) ×2 IMPLANT
SYR 3ML LL SCALE MARK (SYRINGE) ×2 IMPLANT
SYR BULB IRRIG 60ML STRL (SYRINGE) ×4 IMPLANT
TAPE UMBILICAL COTTON 1/8X30 (MISCELLANEOUS) IMPLANT
TOWEL GREEN STERILE (TOWEL DISPOSABLE) ×2 IMPLANT
TOWEL GREEN STERILE FF (TOWEL DISPOSABLE) ×2 IMPLANT
TRAY FOLEY MTR SLVR 16FR STAT (SET/KITS/TRAYS/PACK) ×2 IMPLANT
TUBING EXTENTION W/L.L. (IV SETS) IMPLANT
UNDERPAD 30X36 HEAVY ABSORB (UNDERPADS AND DIAPERS) ×2 IMPLANT
WATER STERILE IRR 1000ML POUR (IV SOLUTION) ×2 IMPLANT

## 2021-07-05 NOTE — Anesthesia Procedure Notes (Signed)
Procedure Name: Intubation Date/Time: 07/05/2021 7:46 PM Performed by: Clearnce Sorrel, CRNA Pre-anesthesia Checklist: Patient identified, Emergency Drugs available, Suction available and Patient being monitored Patient Re-evaluated:Patient Re-evaluated prior to induction Oxygen Delivery Method: Circle System Utilized Preoxygenation: Pre-oxygenation with 100% oxygen Induction Type: IV induction Ventilation: Mask ventilation without difficulty Laryngoscope Size: Mac and 4 Grade View: Grade I Tube type: Oral Tube size: 7.5 mm Number of attempts: 1 Airway Equipment and Method: Stylet and Oral airway Placement Confirmation: ETT inserted through vocal cords under direct vision, positive ETCO2 and breath sounds checked- equal and bilateral Secured at: 23 cm Tube secured with: Tape Dental Injury: Teeth and Oropharynx as per pre-operative assessment

## 2021-07-05 NOTE — Progress Notes (Signed)
Pt admitted from home accompanied by daughter.  Alert, oriented and ambulatory, with home O2 on.  CHG performed, VS taken, tele applied, CCMD notified.  Pt connected to room O2 at 3L, per home use.  Dr Karin Lieu in to see Pt, states surgery planned for this afternoon.  IV team consult placed as Pt states "my veins are small and hard to find."  Will cont plan of care.

## 2021-07-05 NOTE — Progress Notes (Signed)
ERROR

## 2021-07-05 NOTE — Progress Notes (Signed)
Refilled at pt's request 

## 2021-07-05 NOTE — Progress Notes (Addendum)
[facility] [place] HISTORY AND PHYSICAL   History of Present Illness:  Patient is a 75 y.o. year old male who presents for evaluation of PAD.  He is s/p  left femoral thrombectomy with left iliofemoral endarterectomy with bovine pericardial patch angioplasty by Dr. Myra Gianotti on 12/08/2020. He had previously undergone a vein femoral tibial bypass and a Gore-Tex femoral-tibial bypass.  His bypass graft is chronic occluded.  He developed rest pain in his left leg and a CT scan showed common femoral occlusion.  He has a small AAA last measured at 3.2.  He is asymptomatic for abdominal.    He has had a slow healing left groin incision without evidence of infection on his last visit.    Prior history of right femoral to below knee popliteal bypass graft with vein. Subsequent occlusion and thrombectomy and Dr. Myra Gianotti elected to redo his bypass graft, this time using PTFE down from the common femoral to the anterior tibial artery on 09-20-16. Abnormal dilatation of proximal aorta to 3.2 cm in 2019.  He is not diabetic and quit smoking in 2018.  Chronic COPD with 24 hour O2 needs. He continues Pletal for claudication. He is compliant with aspirin, Plavix and statin.  Past Medical History:  Diagnosis Date   Anxiety    Chronic low back pain    "degenerative spine dx'd ~ 7 yr ago" (06/08/2013)   Claudication [facility])    Constipation due to pain medication    COPD (chronic obstructive pulmonary disease) (HCC)    Hypertension    "moderately high; RX didn't help" (06/08/2013) - no longer on meds (as of 09/19/16)   Neuromuscular disorder (HCC)    degen spine   Non-healing wound of lower extremity 06/09/2013   PAD (peripheral artery disease), PTA/stent IDEV & chocolate baloon 06/08/13 04/13/2013   Severely reduced ABI's of 0.3 bilaterally    Tobacco use 06/09/2013   Unexplained weight loss     Past Surgical History:  Procedure Laterality Date   ABDOMINAL AORTAGRAM  05/10/2013    Procedure: ABDOMINAL Ronny Flurry;  Surgeon: Runell Gess, MD;  Location: [facility] CATH LAB;  Service: Cardiovascular;;   ABI     04/09/13; abnormal   ANGIOPLASTY Left 05/21/2016   Procedure: ballon ANGIOPLASTY of femoral to anterior to tibial bypass graft;  Surgeon: Nada Libman, MD;  Location: [facility] OR;  Service: Vascular;  Laterality: Left;   APPENDECTOMY  05/2001   ENDARTERECTOMY FEMORAL Left 12/08/2020   Procedure: REDO LEFT FEMORAL ARTERY ENDARTERECTOMY, LEFT FEMORAL ARTERY THROMBECTOMY;  Surgeon: Nada Libman, MD;  Location: [facility] OR;  Service: Vascular;  Laterality: Left;   FEMORAL ARTERY STENT Right 05/10/13   2 stents Rt SFA   FEMORAL ARTERY STENT Left 06/08/2013   FEMORAL-POPLITEAL BYPASS GRAFT Right 11/12/2013   Procedure: RIGHT  FEMORAL-BELOW KNEE POPLITEAL ARTERY BYPASS GRAFT USING NON- REVERSE RIGHT GREATER SAPHENOUS VEIN.;  Surgeon: Nada Libman, MD;  Location: [facility] OR;  Service: Vascular;  Laterality: Right;   FEMORAL-TIBIAL BYPASS GRAFT Left 12/08/2015   Procedure:  LEFT FEMORAL-ANTERIOR TIBIAL ARTERY BYPASS GRAFT;  Surgeon: Nada Libman, MD;  Location: [facility] OR;  Service: Vascular;  Laterality: Left;   FEMORAL-TIBIAL BYPASS GRAFT Left 09/20/2016   Procedure: REDO BYPASS GRAFT FEMORAL-ANTERIOR TIBIAL ARTERY;  Surgeon: Nada Libman, MD;  Location: [facility] OR;  Service: Vascular;  Laterality: Left;   LOWER EXTREMITY ANGIOGRAM Bilateral 05/10/2013   Procedure: LOWER EXTREMITY ANGIOGRAM;  Surgeon: Runell Gess, MD;  Location: [facility] CATH  LAB;  Service: Cardiovascular;  Laterality: Bilateral;   LOWER EXTREMITY ANGIOGRAM N/A 06/08/2013   Procedure: LOWER EXTREMITY ANGIOGRAM;  Surgeon: Runell Gess, MD;  Location: [facility] CATH LAB;  Service: Cardiovascular;  Laterality: N/A;   LOWER EXTREMITY ANGIOGRAM N/A 10/28/2013   Procedure: LOWER EXTREMITY ANGIOGRAM;  Surgeon: Runell Gess, MD;  Location: [facility] CATH LAB;  Service: Cardiovascular;  Laterality: N/A;   LOWER EXTREMITY ANGIOGRAM N/A 05/16/2014    Procedure: LOWER EXTREMITY ANGIOGRAM;  Surgeon: Runell Gess, MD;  Location: [facility] CATH LAB;  Service: Cardiovascular;  Laterality: N/A;   PATCH ANGIOPLASTY Left 12/08/2020   Procedure: WITH PATCH ANGIOPLASTY USING 1 X 6 CM XENOSURE BIOLOGIC PATCH;  Surgeon: Nada Libman, MD;  Location: [facility] OR;  Service: Vascular;  Laterality: Left;   PERCUTANEOUS STENT INTERVENTION Right 05/10/2013   Procedure: PERCUTANEOUS STENT INTERVENTION;  Surgeon: Runell Gess, MD;  Location: [facility] CATH LAB;  Service: Cardiovascular;  Laterality: Right;  rt sfa and popliteal stent x2   PERIPHERAL VASCULAR CATHETERIZATION N/A 12/06/2015   Procedure: Abdominal Aortogram w/Lower Extremity;  Surgeon: Nada Libman, MD;  Location: [facility] INVASIVE CV LAB;  Service: Cardiovascular;  Laterality: N/A;   PERIPHERAL VASCULAR CATHETERIZATION N/A 05/22/2016   Procedure: Abdominal Aortogram w/Lower Extremity;  Surgeon: Maeola Harman, MD;  Location: [facility] INVASIVE CV LAB;  Service: Cardiovascular;  Laterality: N/A;   PERIPHERAL VASCULAR CATHETERIZATION N/A 09/17/2016   Procedure: Abdominal Aortogram w/Lower Extremity;  Surgeon: Nada Libman, MD;  Location: [facility] INVASIVE CV LAB;  Service: Cardiovascular;  Laterality: N/A;   THROMBECTOMY FEMORAL ARTERY Left 05/21/2016   Procedure: THROMBECTOMY FEMORAL TO ANTERIOR TIBIAL BYPASS GRAFT;  Surgeon: Nada Libman, MD;  Location: [facility] OR;  Service: Vascular;  Laterality: Left;   VEIN HARVEST Left 12/08/2015   Procedure: USING NON REVERSE  LEFT GREATER SAPHENOUS VEIN;  Surgeon: Nada Libman, MD;  Location: [facility] OR;  Service: Vascular;  Laterality: Left;    ROS:   General:  No weight loss, Fever, chills  HEENT: No recent headaches, no nasal bleeding, no visual changes, no sore throat  Neurologic: No dizziness, blackouts, seizures. No recent symptoms of stroke or mini- stroke. No recent episodes of slurred speech, or temporary blindness.  Cardiac: No recent episodes of chest  pain/pressure, positive shortness of breath at rest.  positive shortness of breath with exertion.  Denies history of atrial fibrillation or irregular heartbeat  Vascular: No history of rest pain in feet.  No history of claudication.  No history of non-healing ulcer, No history of DVT   Pulmonary: No home oxygen, no productive cough, no hemoptysis,  No asthma or wheezing  Musculoskeletal:  [ ]  Arthritis, [ ]  Low back pain,  [ ]  Joint pain  Hematologic:No history of hypercoagulable state.  No history of easy bleeding.  No history of anemia  Gastrointestinal: No hematochezia or melena,  No gastroesophageal reflux, no trouble swallowing  Urinary: [ ]  chronic Kidney disease, [ ]  on HD - [ ]  MWF or [ ]  TTHS, [ ]  Burning with urination, [ ]  Frequent urination, [ ]  Difficulty urinating;   Skin: No rashes  Psychological: No history of anxiety,  No history of depression  Social History Social History   Tobacco Use   Smoking status: Former    Packs/day: 0.25    Years: 50.00    Pack years: 12.50    Types: Cigarettes    Start date: 11/14/2015    Quit date: 11/13/2016    Years since  quitting: 4.6   Smokeless tobacco: Never  Vaping Use   Vaping Use: Never used  Substance Use Topics   Alcohol use: No    Alcohol/week: 0.0 standard drinks    Comment: 06/08/2013 "quit 02/2007; drank my share before that though"   Drug use: No    Family History Family History  Problem Relation Age of Onset   Hyperlipidemia Mother    Heart disease Mother    Diabetes Mother    Cancer Father        Liver    Allergies  Allergies  Allergen Reactions   Clindamycin/Lincomycin Diarrhea     Current Outpatient Medications  Medication Sig Dispense Refill   albuterol (VENTOLIN HFA) 108 (90 Base) MCG/ACT inhaler Inhale 2 puffs into the lungs every 6 (six) hours as needed for wheezing or shortness of breath. 18 g 3   aspirin EC 81 MG EC tablet Take 1 tablet (81 mg total) by mouth daily at 6 (six) AM. Swallow  whole.     busPIRone (BUSPAR) 7.5 MG tablet TAKE 1 TABLET BY MOUTH TWICE A DAY (Patient taking differently: Take 7.5 mg by mouth 2 (two) times daily.) 180 tablet 1   cilostazol (PLETAL) 100 MG tablet TAKE ONE TABLET BY MOUTH TWICE DAILY BEFORE meals 180 tablet 1   gabapentin (NEURONTIN) 300 MG capsule Take 1 capsule by mouth every 8 (eight) hours.     ipratropium-albuterol (DUONEB) 0.5-2.5 (3) MG/3ML SOLN TAKE THREE MLS VIA NEBULIZER EVERY 6 HOURS AS NEEDED (Patient taking differently: Inhale 3 mLs into the lungs every 6 (six) hours as needed (shortness of breath).) 360 mL 0   naloxone (NARCAN) nasal spray 4 mg/0.1 mL SMARTSIG:In Nostril     oxyCODONE (ROXICODONE) 15 MG immediate release tablet Take 15 mg by mouth every 4 (four) hours.     rosuvastatin (CRESTOR) 20 MG tablet Take 1 tablet (20 mg total) by mouth daily. 30 tablet 11   sertraline (ZOLOFT) 25 MG tablet Take 1 tablet (25 mg total) by mouth daily. 90 tablet 2   No current facility-administered medications for this visit.    Physical Examination  Vitals:   07/05/21 0932  BP: 126/74  Pulse: (!) 114  Resp: 20  Temp: 97.9 F (36.6 C)  TempSrc: Temporal  SpO2: 98%  Weight: 161 lb 9.6 oz (73.3 kg)  Height: 5\' 7"  (1.702 m)    Body mass index is 25.31 kg/m.  General:  Alert and oriented, no acute distress HEENT: Normal Neck: No bruit or JVD Pulmonary: decreased breath sounds Cardiac: tachy cardia without murmur Abdomen: Soft, non-tender, non-distended, no mass, no scars Skin: No rash       Musculoskeletal: left LE mild edema and cellulitis  Neurologic: Upper and lower extremity motor 5/5 and symmetric  DATA:      ABI Findings:  +---------+------------------+-----+----------+--------+  Right    Rt Pressure (mmHg)IndexWaveform  Comment   +---------+------------------+-----+----------+--------+  Brachial 131                                         +---------+------------------+-----+----------+--------+  PTA      65                0.50 monophasic          +---------+------------------+-----+----------+--------+  Great Toe  Absent              +---------+------------------+-----+----------+--------+   +---------+------------------+-----+-------------------+------------+  Left     Lt Pressure (mmHg)IndexWaveform           Comment       +---------+------------------+-----+-------------------+------------+  Brachial 121                                                     +---------+------------------+-----+-------------------+------------+  PTA      58                0.44 dampened monophasic              +---------+------------------+-----+-------------------+------------+  PERO                            absent                           +---------+------------------+-----+-------------------+------------+  DP                              absent                           +---------+------------------+-----+-------------------+------------+  Great Toe                                          not obtained  +---------+------------------+-----+-------------------+------------+   +-------+-----------+------------+------------+------------+  ABI/TBIToday's ABIToday's TBI Previous ABIPrevious TBI  +-------+-----------+------------+------------+------------+  Right  0.50       not obtained0.51        not obtained  +-------+-----------+------------+------------+------------+  Left   0.44       not obtained0.54        0             +-------+-----------+------------+------------+------------+    Left ABIs appear decreased since prior exam of 03/01/2019 .     Summary:  Right: Resting right ankle-brachial index indicates moderate right lower  extremity arterial disease.   Left: Resting left ankle-brachial index indicates severe left lower  extremity  arterial disease.    ASSESSMENT:  PAD known left LE bypass chronically occluded Cellulitis/infection  Failed chronically occluded bypass graft  He presents today with his bypass graft hanging out of the leg and still attached to the distal tibial anastomosis.  The bypass lateral thigh skin appears infected as well as the distal anastomosis site with hypergranulation.  I cut the residual external portion of the graft off and applied clean dry dressing to the left LE.  PLAN: Direct admit to Dr. Karin Lieu for irrigation and debridement with removal of distal retained bypass graft.   He has been NPO today and will remain NPO.  I will order empiric IV antibiotics.     Mosetta Pigeon PA-C [facility] [place] Office: [phone]  MD in clinic [place]

## 2021-07-05 NOTE — Telephone Encounter (Signed)
Per information received from OR staff Jill Alexanders- spoke with patient and advised to arrive at [facility] admissions on today by 3:15- 3:30 pm for surgical procedure. Pt verbalized understanding.

## 2021-07-05 NOTE — Progress Notes (Addendum)
Pharmacy Antibiotic Note  Christopher Pena is a 75 y.o. male admitted on 07/05/2021 with PAD and known chronically-occluded LLE bypass, and cellulitis.  Pharmacy has been consulted for vancomycin dosing X 7 days.  WBC 15.4, afebrile, Scr 1, CrCl ~60  ml/min  Pt to OR today for  irrigation and debridement with removal of distal retained bypass graft.  Plan: Vancomycin 1500 mg IV Q 24 hrs (estimated vancomycin AUC, using Scr 1, is 532; goal vancomycin AUC is 400-550) Monitor WBC, temp, clinical course, renal function  Temp (24hrs), Avg:97.9 F (36.6 C), Min:97.9 F (36.6 C), Max:97.9 F (36.6 C)  Recent Labs  Lab 07/05/21 1539  WBC 12.4*  CREATININE 1.00    Estimated Creatinine Clearance: 59.7 mL/min (by C-G formula based on SCr of 1 mg/dL).    Allergies  Allergen Reactions   Clindamycin/Lincomycin Diarrhea    Antimicrobials this admission: Zosyn 11/17 >> Vancomycin 11/17 >>  Microbiology results: None ordered yet during this admission  Thank you for allowing pharmacy to be a part of this patient's care.  Vicki Mallet, PharmD, BCPS, [facility] Clinical Pharmacist 07/05/2021 3:22 PM

## 2021-07-05 NOTE — Anesthesia Preprocedure Evaluation (Signed)
Anesthesia Evaluation  Patient identified by MRN, date of birth, ID band Patient awake    Reviewed: Allergy & Precautions, NPO status , Patient's Chart, lab work & pertinent test results  Airway Mallampati: I  TM Distance: >3 FB Neck ROM: Full    Dental no notable dental hx. (+) Edentulous Upper, Edentulous Lower   Pulmonary COPD,  oxygen dependent, Patient abstained from smoking., former smoker,    Pulmonary exam normal breath sounds clear to auscultation       Cardiovascular hypertension, Pt. on medications + Peripheral Vascular Disease and + DOE  Normal cardiovascular exam Rhythm:Regular Rate:Normal     Neuro/Psych Anxiety  Neuromuscular disease    GI/Hepatic negative GI ROS, (+)     substance abuse  ,   Endo/Other  negative endocrine ROS  Renal/GU Lab Results      Component                Value               Date                      CREATININE               1.00                07/05/2021                BUN                      9                   07/05/2021                NA                       134 (L)             07/05/2021                K                        3.3 (L)             07/05/2021                CL                       95 (L)              07/05/2021                CO2                      26                  07/05/2021                Musculoskeletal negative musculoskeletal ROS (+)   Abdominal   Peds  Hematology  (+) anemia , Lab Results      Component                Value               Date                      WBC  12.4 (H)            07/05/2021                HGB                      10.4 (L)            07/05/2021                HCT                      33.8 (L)            07/05/2021                MCV                      83.7                07/05/2021                PLT                      406 (H)             07/05/2021              Anesthesia Other Findings    Reproductive/Obstetrics negative OB ROS                            Anesthesia Physical Anesthesia Plan  ASA: 3 and emergent  Anesthesia Plan: General   Post-op Pain Management:    Induction: Intravenous  PONV Risk Score and Plan: 3 and Treatment may vary due to age or medical condition and Ondansetron  Airway Management Planned: Oral ETT  Additional Equipment: None  Intra-op Plan:   Post-operative Plan: Extubation in OR  Informed Consent: I have reviewed the patients History and Physical, chart, labs and discussed the procedure including the risks, benefits and alternatives for the proposed anesthesia with the patient or authorized representative who has indicated his/her understanding and acceptance.     Dental advisory given  Plan Discussed with:   Anesthesia Plan Comments:        Anesthesia Quick Evaluation

## 2021-07-05 NOTE — H&P (Signed)
[facility] [place] HISTORY AND PHYSICAL   Patient has been admitted to my service from clinic Previous history of multiple left leg revascularizations, bypasses. Presented to clinic today with infected bypass hanging out of his leg. At his last operation, the bypass was ligated proximally and transected.  On exam, Christopher Pena had no complaints.  No fevers, no chills. He appreciated erythema and induration for some time, but did not want come to the hospital, as he knew he would need admission and likely surgery. No change from physical exam as outlined below Medications outlined below After discussing risk and benefits of left lower extremity bypass excision, left leg revascularization, Christopher Pena elected proceed.  He is aware, graft excision may require arterial patch with vein.  He is also aware that left lower extremity perfusion could worsen with surgery.  Current pathology is a limb threatening event.  Christopher Sparrow, MD    History of Present Illness:  Patient is a 75 y.o. year old male who presents for evaluation of PAD.  He is s/p  left femoral thrombectomy with left iliofemoral endarterectomy with bovine pericardial patch angioplasty by Dr. Myra Gianotti on 12/08/2020. He had previously undergone a vein femoral tibial bypass and a Gore-Tex femoral-tibial bypass.  His bypass graft is chronic occluded.  He developed rest pain in his left leg and a CT scan showed common femoral occlusion.  He has a small AAA last measured at 3.2.  He is asymptomatic for abdominal.    He has had a slow healing left groin incision without evidence of infection on his last visit.    Prior history of right femoral to below knee popliteal bypass graft with vein. Subsequent occlusion and thrombectomy and Dr. Myra Gianotti elected to redo his bypass graft, this time using PTFE down from the common femoral to the anterior tibial artery on 09-20-16. Abnormal dilatation of proximal aorta to 3.2 cm in 2019.  He is  not diabetic and quit smoking in 2018.  Chronic COPD with 24 hour O2 needs. He continues Pletal for claudication. He is compliant with aspirin, Plavix and statin.  Past Medical History:  Diagnosis Date   Anxiety    Chronic low back pain    "degenerative spine dx'd ~ 7 yr ago" (06/08/2013)   Claudication [facility])    Constipation due to pain medication    COPD (chronic obstructive pulmonary disease) (HCC)    Hypertension    "moderately high; RX didn't help" (06/08/2013) - no longer on meds (as of 09/19/16)   Neuromuscular disorder (HCC)    degen spine   Non-healing wound of lower extremity 06/09/2013   PAD (peripheral artery disease), PTA/stent IDEV & chocolate baloon 06/08/13 04/13/2013   Severely reduced ABI's of 0.3 bilaterally    Tobacco use 06/09/2013   Unexplained weight loss     Past Surgical History:  Procedure Laterality Date   ABDOMINAL AORTAGRAM  05/10/2013   Procedure: ABDOMINAL Ronny Flurry;  Surgeon: Runell Gess, MD;  Location: [facility] CATH LAB;  Service: Cardiovascular;;   ABI     04/09/13; abnormal   ANGIOPLASTY Left 05/21/2016   Procedure: ballon ANGIOPLASTY of femoral to anterior to tibial bypass graft;  Surgeon: Nada Libman, MD;  Location: [facility] OR;  Service: Vascular;  Laterality: Left;   APPENDECTOMY  05/2001   ENDARTERECTOMY FEMORAL Left 12/08/2020   Procedure: REDO LEFT FEMORAL ARTERY ENDARTERECTOMY, LEFT FEMORAL ARTERY THROMBECTOMY;  Surgeon: Nada Libman, MD;  Location: [facility] OR;  Service: Vascular;  Laterality: Left;  FEMORAL ARTERY STENT Right 05/10/13   2 stents Rt SFA   FEMORAL ARTERY STENT Left 06/08/2013   FEMORAL-POPLITEAL BYPASS GRAFT Right 11/12/2013   Procedure: RIGHT  FEMORAL-BELOW KNEE POPLITEAL ARTERY BYPASS GRAFT USING NON- REVERSE RIGHT GREATER SAPHENOUS VEIN.;  Surgeon: Nada Libman, MD;  Location: [facility] OR;  Service: Vascular;  Laterality: Right;   FEMORAL-TIBIAL BYPASS GRAFT Left 12/08/2015   Procedure:  LEFT FEMORAL-ANTERIOR TIBIAL ARTERY BYPASS  GRAFT;  Surgeon: Nada Libman, MD;  Location: [facility] OR;  Service: Vascular;  Laterality: Left;   FEMORAL-TIBIAL BYPASS GRAFT Left 09/20/2016   Procedure: REDO BYPASS GRAFT FEMORAL-ANTERIOR TIBIAL ARTERY;  Surgeon: Nada Libman, MD;  Location: [facility] OR;  Service: Vascular;  Laterality: Left;   LOWER EXTREMITY ANGIOGRAM Bilateral 05/10/2013   Procedure: LOWER EXTREMITY ANGIOGRAM;  Surgeon: Runell Gess, MD;  Location: [facility] CATH LAB;  Service: Cardiovascular;  Laterality: Bilateral;   LOWER EXTREMITY ANGIOGRAM N/A 06/08/2013   Procedure: LOWER EXTREMITY ANGIOGRAM;  Surgeon: Runell Gess, MD;  Location: [facility] CATH LAB;  Service: Cardiovascular;  Laterality: N/A;   LOWER EXTREMITY ANGIOGRAM N/A 10/28/2013   Procedure: LOWER EXTREMITY ANGIOGRAM;  Surgeon: Runell Gess, MD;  Location: [facility] CATH LAB;  Service: Cardiovascular;  Laterality: N/A;   LOWER EXTREMITY ANGIOGRAM N/A 05/16/2014   Procedure: LOWER EXTREMITY ANGIOGRAM;  Surgeon: Runell Gess, MD;  Location: [facility] CATH LAB;  Service: Cardiovascular;  Laterality: N/A;   PATCH ANGIOPLASTY Left 12/08/2020   Procedure: WITH PATCH ANGIOPLASTY USING 1 X 6 CM XENOSURE BIOLOGIC PATCH;  Surgeon: Nada Libman, MD;  Location: [facility] OR;  Service: Vascular;  Laterality: Left;   PERCUTANEOUS STENT INTERVENTION Right 05/10/2013   Procedure: PERCUTANEOUS STENT INTERVENTION;  Surgeon: Runell Gess, MD;  Location: [facility] CATH LAB;  Service: Cardiovascular;  Laterality: Right;  rt sfa and popliteal stent x2   PERIPHERAL VASCULAR CATHETERIZATION N/A 12/06/2015   Procedure: Abdominal Aortogram w/Lower Extremity;  Surgeon: Nada Libman, MD;  Location: [facility] INVASIVE CV LAB;  Service: Cardiovascular;  Laterality: N/A;   PERIPHERAL VASCULAR CATHETERIZATION N/A 05/22/2016   Procedure: Abdominal Aortogram w/Lower Extremity;  Surgeon: Maeola Harman, MD;  Location: [facility] INVASIVE CV LAB;  Service: Cardiovascular;  Laterality: N/A;   PERIPHERAL VASCULAR CATHETERIZATION N/A  09/17/2016   Procedure: Abdominal Aortogram w/Lower Extremity;  Surgeon: Nada Libman, MD;  Location: [facility] INVASIVE CV LAB;  Service: Cardiovascular;  Laterality: N/A;   THROMBECTOMY FEMORAL ARTERY Left 05/21/2016   Procedure: THROMBECTOMY FEMORAL TO ANTERIOR TIBIAL BYPASS GRAFT;  Surgeon: Nada Libman, MD;  Location: [facility] OR;  Service: Vascular;  Laterality: Left;   VEIN HARVEST Left 12/08/2015   Procedure: USING NON REVERSE  LEFT GREATER SAPHENOUS VEIN;  Surgeon: Nada Libman, MD;  Location: [facility] OR;  Service: Vascular;  Laterality: Left;    ROS:   General:  No weight loss, Fever, chills  HEENT: No recent headaches, no nasal bleeding, no visual changes, no sore throat  Neurologic: No dizziness, blackouts, seizures. No recent symptoms of stroke or mini- stroke. No recent episodes of slurred speech, or temporary blindness.  Cardiac: No recent episodes of chest pain/pressure, positive shortness of breath at rest.  positive shortness of breath with exertion.  Denies history of atrial fibrillation or irregular heartbeat  Vascular: No history of rest pain in feet.  No history of claudication.  No history of non-healing ulcer, No history of DVT   Pulmonary: No home oxygen, no productive cough, no hemoptysis,  No asthma or  wheezing  Musculoskeletal:  [ ]  Arthritis, [ ]  Low back pain,  [ ]  Joint pain  Hematologic:No history of hypercoagulable state.  No history of easy bleeding.  No history of anemia  Gastrointestinal: No hematochezia or melena,  No gastroesophageal reflux, no trouble swallowing  Urinary: [ ]  chronic Kidney disease, [ ]  on HD - [ ]  MWF or [ ]  TTHS, [ ]  Burning with urination, [ ]  Frequent urination, [ ]  Difficulty urinating;   Skin: No rashes  Psychological: No history of anxiety,  No history of depression  Social History Social History   Tobacco Use   Smoking status: Former    Packs/day: 0.25    Years: 50.00    Pack years: 12.50    Types: Cigarettes    Start  date: 11/14/2015    Quit date: 11/13/2016    Years since quitting: 4.6   Smokeless tobacco: Never  Vaping Use   Vaping Use: Never used  Substance Use Topics   Alcohol use: No    Alcohol/week: 0.0 standard drinks    Comment: 06/08/2013 "quit 02/2007; drank my share before that though"   Drug use: No    Family History Family History  Problem Relation Age of Onset   Hyperlipidemia Mother    Heart disease Mother    Diabetes Mother    Cancer Father        Liver    Allergies  Allergies  Allergen Reactions   Clindamycin/Lincomycin Diarrhea     Current Facility-Administered Medications  Medication Dose Route Frequency Provider Last Rate Last Admin   0.9 %  sodium chloride infusion   Intravenous Continuous , Emma M, PA-C       albuterol (PROVENTIL) (2.5 MG/3ML) 0.083% nebulizer solution 2.5 mg  2.5 mg Nebulization Q6H PRN , MD       alum & mag hydroxide-simeth (MAALOX/MYLANTA) 200-200-20 MG/5ML suspension 15-30 mL  15-30 mL Oral Q2H PRN 11/16/2015 M, PA-C       [START ON 07/06/2021] aspirin EC tablet 81 mg  81 mg Oral Q0600 Thomasena Edis M, PA-C       busPIRone (BUSPAR) tablet 7.5 mg  7.5 mg Oral BID M, Emma M, PA-C       cilostazol (PLETAL) tablet 100 mg  100 mg Oral QHS Collins, Emma M, PA-C       gabapentin (NEURONTIN) capsule 300 mg  300 mg Oral Q8H Collins, Emma M, PA-C       heparin injection 5,000 Units  5,000 Units Subcutaneous Q8H Collins, Emma M, PA-C       hydrALAZINE (APRESOLINE) injection 5 mg  5 mg Intravenous Q20 Min PRN Thomasena Edis M, PA-C       HYDROmorphone (DILAUDID) injection 0.5-1 mg  0.5-1 mg Intravenous Q2H PRN M M, PA-C       ipratropium-albuterol (DUONEB) 0.5-2.5 (3) MG/3ML nebulizer solution 3 mL  3 mL Inhalation Q6H PRN M M, PA-C       labetalol (NORMODYNE) injection 10 mg  10 mg Intravenous Q10 min PRN M M, PA-C       metoprolol tartrate (LOPRESSOR) injection 2-5 mg  2-5 mg Intravenous  Q2H PRN M M, PA-C       ondansetron [employer]) injection 4 mg  4 mg Intravenous Q6H PRN M M, PA-C       oxyCODONE (Oxy IR/ROXICODONE) immediate release tablet 15 mg  15 mg Oral Q4H M, PA-C  pantoprazole (PROTONIX) EC tablet 40 mg  40 mg Oral Daily Clinton Gallant M, PA-C       piperacillin-tazobactam (ZOSYN) IVPB 3.375 g  3.375 g Intravenous Q8H Collins, Emma M, PA-C       potassium chloride SA (KLOR-CON) CR tablet 20-40 mEq  20-40 mEq Oral Once [place], Kara Mead M, PA-C       rosuvastatin (CRESTOR) tablet 20 mg  20 mg Oral Daily Clinton Gallant M, PA-C       sertraline (ZOLOFT) tablet 25 mg  25 mg Oral Daily Collins, Emma M, PA-C       vancomycin (VANCOREADY) IVPB 1500 mg/300 mL  1,500 mg Intravenous Once Christopher Sparrow, MD        Physical Examination  Vitals:   07/05/21 1515  BP: 128/68  Pulse: (!) 121  Resp: 17  Temp: 97.9 F (36.6 C)  TempSrc: Oral  SpO2: 98%  Weight: 72.6 kg  Height: 5\' 7"  (1.702 m)    Body mass index is 25.06 kg/m.  General:  Alert and oriented, no acute distress HEENT: Normal Neck: No bruit or JVD Pulmonary: decreased breath sounds Cardiac: tachy cardia without murmur Abdomen: Soft, non-tender, non-distended, no mass, no scars Skin: No rash       Musculoskeletal: left LE mild edema and cellulitis  Neurologic: Upper and lower extremity motor 5/5 and symmetric  DATA:      ABI Findings:  +---------+------------------+-----+----------+--------+  Right    Rt Pressure (mmHg)IndexWaveform  Comment   +---------+------------------+-----+----------+--------+  Brachial 131                                        +---------+------------------+-----+----------+--------+  PTA      65                0.50 monophasic          +---------+------------------+-----+----------+--------+  Great Toe                       Absent              +---------+------------------+-----+----------+--------+    +---------+------------------+-----+-------------------+------------+  Left     Lt Pressure (mmHg)IndexWaveform           Comment       +---------+------------------+-----+-------------------+------------+  Brachial 121                                                     +---------+------------------+-----+-------------------+------------+  PTA      58                0.44 dampened monophasic              +---------+------------------+-----+-------------------+------------+  PERO                            absent                           +---------+------------------+-----+-------------------+------------+  DP                              absent                           +---------+------------------+-----+-------------------+------------+  Great Toe                                          not obtained  +---------+------------------+-----+-------------------+------------+   +-------+-----------+------------+------------+------------+  ABI/TBIToday's ABIToday's TBI Previous ABIPrevious TBI  +-------+-----------+------------+------------+------------+  Right  0.50       not obtained0.51        not obtained  +-------+-----------+------------+------------+------------+  Left   0.44       not obtained0.54        0             +-------+-----------+------------+------------+------------+    Left ABIs appear decreased since prior exam of 03/01/2019 .     Summary:  Right: Resting right ankle-brachial index indicates moderate right lower  extremity arterial disease.   Left: Resting left ankle-brachial index indicates severe left lower  extremity arterial disease.    ASSESSMENT:  PAD known left LE bypass chronically occluded Cellulitis/infection  Failed chronically occluded bypass graft  He presents today with his bypass graft hanging out of the leg and still attached to the distal tibial anastomosis.  The bypass lateral thigh skin  appears infected as well as the distal anastomosis site with hypergranulation.  I cut the residual external portion of the graft off and applied clean dry dressing to the left LE.  PLAN: Direct admit to Dr. Karin Lieu for irrigation and debridement with removal of distal retained bypass graft.   He has been NPO today and will remain NPO.  I will order empiric IV antibiotics.     Christopher Sparrow PA-C [facility] [place] Office: [phone]  MD in clinic [place]

## 2021-07-05 NOTE — Transfer of Care (Signed)
Immediate Anesthesia Transfer of Care Note  Patient: Christopher Pena  Procedure(s) Performed: IRRIGATION AND DEBRIDEMENT LEFT LOWER EXTREMITY (Left: Leg Lower) RESECTION OF LEFT LOWER EXTREMITY BYPASS GRAFT (Left: Leg Lower) VEIN HARVEST LEFT GREATER SAPHENOUS (Left: Leg Lower) ANTERIOR TIBIAL ARTERY PATCH USING GREATER SAPHENOUS VEIN (Left: Leg Lower)  Patient Location: PACU  Anesthesia Type:General  Level of Consciousness: awake, drowsy and patient cooperative  Airway & Oxygen Therapy: Patient Spontanous Breathing and Patient connected to face mask oxygen  Post-op Assessment: Report given to RN, Post -op Vital signs reviewed and stable and Patient moving all extremities X 4  Post vital signs: Reviewed and stable  Last Vitals:  Vitals Value Taken Time  BP 109/65 07/05/21 2241  Temp    Pulse 117 07/05/21 2242  Resp 16 07/05/21 2242  SpO2 96 % 07/05/21 2242  Vitals shown include unvalidated device data.  Last Pain:  Vitals:   07/05/21 1719  TempSrc: Oral         Complications: No notable events documented.

## 2021-07-05 NOTE — Op Note (Addendum)
NAME: Christopher Pena    MRN: [id number] DOB: 1946/05/19    DATE OF OPERATION: 07/05/2021  PREOP DIAGNOSIS:    Infected Bypass graft  POSTOP DIAGNOSIS:    Same  PROCEDURE:    Left lower extremity bypass excision Left greater saphenous vein harvest Left anterior tibial artery vein patch  SURGEON: Victorino Sparrow  ASSIST: Nathanial Rancher  ANESTHESIA: General anesthesia  EBL: 500 mL  INDICATIONS:    Christopher Pena is a 75 y.o. male with history of multiple left lower extremity revascularizations, including left anterior tibial artery bypass with greater saphenous vein, left anterior tibial artery bypass with PTFE, left iliofemoral endarterectomy, embolectomy for acute limb ischemia. The patient presented to our office earlier today with his bypass graft dragging the floor.  During his last operation, left iliofemoral endarterectomy, embolectomy, the previously occluded left anterior tibial artery PTFE bypass was transected.  Over the last several weeks, Christopher Pena has appreciated erythema on the lateral aspect of his left thigh with a wound at his knee.  This wound has been draining and several weeks ago he appreciated the bypass graft exposed through the skin.  He chose not to seek treatment, as he knew it would end up in hospitalization and operative excision.  He presented today after the thigh portion of his bypass graft pulled through and was dangling from his knee.  I had a long discussion with the patient and his daughter regarding need for bypass excision, and the likelihood that the excision would affect the collaterals he has built.  Furthermore, anterior tibial artery patency distal to the bypass graft was unknown.  My plan was to remove the infected bypass graft and possibly patch the anterior tibial artery should flow be present.Christopher Pena, at baseline, was a severe, claudicant, but remained sensorimotor intact.  He was aware this operation compression to rest pain, or into acute  limb ischemia.  I told both he and his daughter his current pathology is high risk of limb loss.  FINDINGS:   Purulent bypass graft Minimal flow in the anterior tibial artery Peroneal signal at case completion  TECHNIQUE:   Patient was brought to the OR laid in the supine position.  General anesthesia was induced and the patient was prepped and draped in standard fashion. The case began with ultrasound insonation of left lower extremity superficial veins.  At the distal tibia, remnant greater saphenous vein was appreciated distal to previous saphenectomy.  This was marked for later saphenectomy.  A longitudinal incision was made on the patient's calf along the track of the anterior tibial artery PTFE bypass.  This tract readily apparent as there was purulence throughout.  The incision was taken down to the head of the bypass that was sewn to the anterior tibial artery.  Diffuse bleeding was appreciated due to the granulation tissue that surrounded all of the PTFE bypass tract.  Furthermore, the patient was on Plavix.  I elected to heparinize the patient and use a tourniquet on the thigh.  An Esmarch thrombectomy was used to bleed the leg.  Next, the hood of the bypass graft was removed from the anterior tibial artery.  The bypass graft pulled off, and did not require cutting the Prolene suture.  The proximal and distal ends of the arteriotomy were examined and there appeared to be a small flow lumen.  The tourniquet was deflated demonstrating a small ooze both proximally and distally, and immediately reinflated.  I was worried that ligation would  hurt his perfusion, and wanted to do everything possible to keep Christopher Pena out of acute limb ischemia.  Therefore, I elected to patch the anterior tibial artery using greater saphenous vein.  The distal greater saphenous vein that was marked at the beginning of the case was harvested in standard fashion.  A longitudinal incision was made along the vein which was  splayed open.  This was sewn in using 6-0 Prolene suture.  Coronary dilators were used prior to completing the anastomosis to ensure there was no narrowing at the proximal and distal endpoints.  Prior to completion, the tourniquet was deflated with a small amount of bleeding appreciated from the vein patch.  A venous signal was appreciated distally.  Once completed, my attention turned to the bypass tract that ran from the left groin to the calf.  The bypass graft had worked its way to the dermis throughout the thigh.  It was devitalized skin overlying the bypass tract.  I chose to resect this area in the thigh, leaving the proximal segment as the tissue appeared healthy.  A small granuloma was seen at the most proximal portion in the groin.  When antibiotic saline was used, the tract opened up and pressurized in the tract, the granuloma drained the antibiotic saline.  This was opened further and devitalized tissue resected.  All wound beds were irrigated with antibiotic laden saline.  Being that the entire bypass tract was infected, I elected to place a piece of 1 inch iodoform gauze throughout the tract.  Skin was undermined in both the calf and thigh in an effort to close the bypass tract.  As much of the overlying dermis had to be resected due to being nonviable in the thigh.  A mix of 2-0 nylon suture in vertical mattress fashion as well as staples were used to close the thigh.  The proximal portion was left open with iodoform gauze out.  Iodoform gauze was left open to air at the knee.  The distal portion of the incision was closed with 2-0 Vicryl and Vesseloops in Roman sandal fashion as I was afraid closing under tension would lead to further deficits in perfusion. The saphenectomy site was closed with staples.  There was diffuse ooze due to the Plavix as well as from the granulation tissue.  I elected to cover the incisions with dry gauze and compression.  At case completion, the patient had a  monophasic peroneal signal. Venous PT signal.  Given the complexity of the case a first assistant was necessary in order to expedite the procedure and safely perform the technical aspects of the operation.  Patient is high risk for poor wound healing, future amputation.  Ladonna Snide, MD [facility]  DATE OF DICTATION:   07/05/2021

## 2021-07-06 ENCOUNTER — Encounter (HOSPITAL_COMMUNITY): Payer: Self-pay | Admitting: Vascular Surgery

## 2021-07-06 DIAGNOSIS — Z9889 Other specified postprocedural states: Secondary | ICD-10-CM

## 2021-07-06 LAB — BASIC METABOLIC PANEL
Anion gap: 8 (ref 5–15)
BUN: 10 mg/dL (ref 8–23)
CO2: 27 mmol/L (ref 22–32)
Calcium: 7.8 mg/dL — ABNORMAL LOW (ref 8.9–10.3)
Chloride: 102 mmol/L (ref 98–111)
Creatinine, Ser: 1.12 mg/dL (ref 0.61–1.24)
GFR, Estimated: >60 mL/min (ref >60)
Glucose, Bld: 165 mg/dL — ABNORMAL HIGH (ref 70–99)
Potassium: 4.5 mmol/L (ref 3.5–5.1)
Sodium: 137 mmol/L (ref 135–145)

## 2021-07-06 LAB — LIPID PANEL
Cholesterol: 74 mg/dL (ref 0–200)
HDL: 45 mg/dL (ref >40)
LDL Cholesterol: 19 mg/dL (ref 0–99)
Total CHOL/HDL Ratio: 1.6 RATIO
Triglycerides: 48 mg/dL (ref <150)
VLDL: 10 mg/dL (ref 0–40)

## 2021-07-06 LAB — CBC
HCT: 28.7 % — ABNORMAL LOW (ref 39.0–52.0)
Hemoglobin: 8.6 g/dL — ABNORMAL LOW (ref 13.0–17.0)
MCH: 25.7 pg — ABNORMAL LOW (ref 26.0–34.0)
MCHC: 30 g/dL (ref 30.0–36.0)
MCV: 85.9 fL (ref 80.0–100.0)
Platelets: 498 10*3/uL — ABNORMAL HIGH (ref 150–400)
RBC: 3.34 MIL/uL — ABNORMAL LOW (ref 4.22–5.81)
RDW: 14.9 % (ref 11.5–15.5)
WBC: 20.9 10*3/uL — ABNORMAL HIGH (ref 4.0–10.5)
nRBC: 0 % (ref 0.0–0.2)

## 2021-07-06 LAB — HEMOGLOBIN A1C
Hgb A1c MFr Bld: 5.8 % — ABNORMAL HIGH (ref 4.8–5.6)
Mean Plasma Glucose: 119.76 mg/dL

## 2021-07-06 LAB — VANCOMYCIN, RANDOM: Vancomycin Rm: 23

## 2021-07-06 LAB — SURGICAL PCR SCREEN
MRSA, PCR: NEGATIVE
Staphylococcus aureus: NEGATIVE

## 2021-07-06 LAB — GLUCOSE, CAPILLARY
Glucose-Capillary: 143 mg/dL — ABNORMAL HIGH (ref 70–99)
Glucose-Capillary: 124 mg/dL — ABNORMAL HIGH (ref 70–99)
Glucose-Capillary: 138 mg/dL — ABNORMAL HIGH (ref 70–99)

## 2021-07-06 MED ORDER — VANCOMYCIN HCL 1500 MG/300ML IV SOLN
1500.0000 mg | INTRAVENOUS | Status: DC
  Administered 2021-07-07 – 2021-07-08 (×2): 1500 mg via INTRAVENOUS
  Filled 2021-07-06 (×2): qty 300

## 2021-07-06 MED ORDER — INSULIN ASPART 100 UNIT/ML IJ SOLN
0.0000 [IU] | Freq: Three times a day (TID) | INTRAMUSCULAR | Status: DC
  Administered 2021-07-07 – 2021-07-08 (×2): 1 [IU] via SUBCUTANEOUS

## 2021-07-06 MED ORDER — ORAL CARE MOUTH RINSE
15.0000 mL | Freq: Two times a day (BID) | OROMUCOSAL | Status: DC
  Administered 2021-07-07 – 2021-07-12 (×9): 15 mL via OROMUCOSAL

## 2021-07-06 MED ORDER — INFLUENZA VAC A&B SA ADJ QUAD 0.5 ML IM PRSY
0.5000 mL | PREFILLED_SYRINGE | INTRAMUSCULAR | Status: AC
  Administered 2021-07-07: 0.5 mL via INTRAMUSCULAR
  Filled 2021-07-06: qty 0.5

## 2021-07-06 MED ORDER — HYDROMORPHONE HCL 1 MG/ML IJ SOLN
2.0000 mg | INTRAMUSCULAR | Status: DC | PRN
  Administered 2021-07-06: 01:00:00 2 mg via INTRAVENOUS
  Filled 2021-07-06: qty 2

## 2021-07-06 NOTE — Progress Notes (Addendum)
[facility] [place]  Subjective  - No new issues   Objective 90/69 73 97.6 F (36.4 C) (Oral) 14 99%  Intake/Output Summary (Last 24 hours) at 07/06/2021 1006 Last data filed at 07/06/2021 7371 Gross per 24 hour  Intake 2884.34 ml  Output 800 ml  Net 2084.34 ml    Peroneal signal left LE Dressing clean and intact left LE IV antibiotics active Lungs non labored breathing   Assessment/Planning: POD # 1  Left lower extremity bypass excision Left greater saphenous vein harvest Left anterior tibial artery vein patch  Plan to change dressing tomorrow and remove Iodoform packing Zenaida Niece and Zosyn IV antibiotics continued Afebrile with WBC 20 HGB 8,6 acute blood loss anemia will follow labs 02 24 hrs.  COPD Hyperglycemia noted on labs Glucose 165 will order A1C and SS  VASCULAR STAFF ADDENDUM: I have independently interviewed and examined the patient. I agree with the above.  OOB today with PT/OT  Monophasic peroneal signal Dressings down tomorrow, iodoform gauze out tomorrow  Fara Olden, MD [facility] Phone Number: [phone] 07/06/2021 4:54 PM     Mosetta Pigeon 07/06/2021 10:06 AM --  Laboratory Lab Results: Recent Labs    07/05/21 1539 07/05/21 2149 07/05/21 2305 07/06/21 0115  WBC 12.4*  --   --  20.9*  HGB 10.4*   < > 9.5* 8.6*  HCT 33.8*   < > 28.0* 28.7*  PLT 406*  --   --  498*   < > = values in this interval not displayed.   BMET Recent Labs    07/05/21 1539 07/05/21 2149 07/05/21 2305 07/06/21 0115  NA 134*   < > 135 137  K 3.3*   < > 4.1 4.5  CL 95*  --   --  102  CO2 26  --   --  27  GLUCOSE 146*  --   --  165*  BUN 9  --   --  10  CREATININE 1.00  --   --  1.12  CALCIUM 8.3*  --   --  7.8*   < > = values in this interval not displayed.    COAG Lab Results  Component Value Date   INR 1.2 07/05/2021   INR 1.1 12/07/2020   INR 1.0 12/06/2020    No results found for: PTT

## 2021-07-06 NOTE — Progress Notes (Signed)
Pharmacy Antibiotic Note  Christopher Pena is a 75 y.o. male admitted on 07/05/2021 with PAD and known chronically-occluded LLE bypass, and cellulitis.  Pharmacy has been consulted for vancomycin dosing X 7 days.   WBC 15 > 20, afebrile, Scr 1.12, CrCl ~53 ml/min   Pt to OR today for irrigation and debridement with removal of distal retained bypass graft. 2 vancomycin 1500mg  doses charted 11/17 @ 1836 and 1951 resulting in a load of roughly 43mg /kg. Random vancomycin level from 11/18 23, elevated from goal trough 15-20 but not at steady state.   Estimated AUC for vancomycin 3000mg  Q48H is 541. Will forgo vancomycin dose this evening and resume 1500mg  dosing 11/19. Will continue to watch renal function closely.   Plan: - No vancomycin dose 11/18  - Resume vancomycin 1500mg  IV Q24H on 11/19 @ 1800 - Continue monitoring renal function for s/sx renal toxicity - Monitor WBC, temp, clinical course, and cultures  Height: 5\' 7"  (170.2 cm) Weight: 72.6 kg (160 lb) (stated per pt) IBW/kg (Calculated) : 66.1  Temp (24hrs), Avg:97.7 F (36.5 C), Min:97.4 F (36.3 C), Max:98.1 F (36.7 C)  Recent Labs  Lab 07/05/21 1539 07/06/21 0115  WBC 12.4* 20.9*  CREATININE 1.00 1.12  VANCORANDOM  --  23    Estimated Creatinine Clearance: 53.3 mL/min (by C-G formula based on SCr of 1.12 mg/dL).    Allergies  Allergen Reactions   Clindamycin/Lincomycin Diarrhea    Antimicrobials this admission: Zosyn 11/17 >>   Vanc   11/17 >>     Microbiology results: 11/17 surgical cx: pending 11/17 MRSA PCR: negative   Thank you for allowing pharmacy to be a part of this patient's care.  07/07/21, PharmD Clinical Pharmacist

## 2021-07-06 NOTE — Evaluation (Signed)
Occupational Therapy Evaluation Patient Details Name: Christopher Pena MRN: [id number] DOB: 1945/12/28 Today's Date: 07/06/2021   History of Present Illness Pt is a 75 y.o. male admitted on 07/05/21 with an infected bypass graft of the LLE. S/p bypass excision on 11/17.  Pt has underwent multiple LLE revascularizations and a R femoral to popliteal graft. PMH includes claudication, non healing LE wound, tobacco use, COPD, chronic low back pain   Clinical Impression   PTA, pt lives alone in level entry apartment. Pt reports typically Independent with ADLs, IADLs and mobility. Pt was driving and grocery shopping but has had family assist prn since initial procedure 6/22. Pt presents now with pain controlled, able to mobilize on a min guard to supervision basis using RW. Pt Setup for UB ADLs and Min A for LB ADLs (assist for socks, does not wear at baseline). Educated re: use of RW for safe mobility, sponge bathing until cleared for showering tasks, initial family supervision for tub transfers, compensatory strategies for LB ADLs, and family assist for relevant IADLs as needed.  Pt verbalized understanding with no OT needs anticipated at DC. Will continue to follow acutely to progress ADL independence and assess tub transfer abilities.  BP soft 90s/60s, denies dizziness with activity. See PT eval for BP assessment with hallway mobility SpO2 WFL on 3 L O2 (reports wearing this at baseline)     Recommendations for follow up therapy are one component of a multi-disciplinary discharge planning process, led by the attending physician.  Recommendations may be updated based on patient status, additional functional criteria and insurance authorization.   Follow Up Recommendations  No OT follow up    Assistance Recommended at Discharge PRN  Functional Status Assessment  Patient has had a recent decline in their functional status and demonstrates the ability to make significant improvements in function in a  reasonable and predictable amount of time.  Equipment Recommendations  None recommended by OT    Recommendations for Other Services       Precautions / Restrictions Precautions Precautions: Fall;Other (comment) Precaution Comments: monitor O2, soft BP Restrictions Weight Bearing Restrictions: No      Mobility Bed Mobility Overal bed mobility: Modified Independent             General bed mobility comments: HOB elevated, use of bed rails    Transfers Overall transfer level: Needs assistance Equipment used: Rolling walker (2 wheels) Transfers: Sit to/from Stand Sit to Stand: Min guard           General transfer comment: for safety, progressing to supervision. able to demo safe hand placement      Balance Overall balance assessment: Needs assistance Sitting-balance support: No upper extremity supported;Feet supported Sitting balance-Leahy Scale: Good     Standing balance support: Bilateral upper extremity supported;No upper extremity supported;During functional activity Standing balance-Leahy Scale: Fair Standing balance comment: fair static standing, requested RW use of mobility                           ADL either performed or assessed with clinical judgement   ADL Overall ADL's : Needs assistance/impaired Eating/Feeding: Independent;Sitting   Grooming: Set up;Standing   Upper Body Bathing: Set up;Sitting   Lower Body Bathing: Min guard;Sit to/from stand   Upper Body Dressing : Set up;Sitting   Lower Body Dressing: Minimal assistance;Sit to/from stand Lower Body Dressing Details (indicate cue type and reason): assist to don socks (does not wear at baseline);  able to demo ability to reach to B feet without groin or LE discomfort. Likely able to manage underwear/pants at home Toilet Transfer: Min guard;Ambulation;Rolling walker (2 wheels) Toilet Transfer Details (indicate cue type and reason): for safety progressing to supervision Toileting-  Clothing Manipulation and Hygiene: Min guard;Sit to/from stand       Functional mobility during ADLs: Min guard;Rolling walker (2 wheels) General ADL Comments: Pt with managed pain, able to mobilize well with questionable soft BP readings (denies dizziness). No difficulties in reaching B feet at this time. Educated on compensatory strategies for LB dressing and easier clothing to manage at home. Encouraged sponge bathing until cleared for showering from MD, then encouraged family being present for initial showering tasks     Vision Ability to See in Adequate Light: 0 Adequate Patient Visual Report: No change from baseline Vision Assessment?: No apparent visual deficits     Perception     Praxis      Pertinent Vitals/Pain Pain Assessment: Faces Faces Pain Scale: Hurts a little bit Pain Location: L LE Pain Descriptors / Indicators: Operative site guarding Pain Intervention(s): Monitored during session;Limited activity within patient's tolerance;Premedicated before session     Hand Dominance Right   Extremity/Trunk Assessment Upper Extremity Assessment Upper Extremity Assessment: Overall WFL for tasks assessed   Lower Extremity Assessment Lower Extremity Assessment: Defer to PT evaluation   Cervical / Trunk Assessment Cervical / Trunk Assessment: Normal   Communication Communication Communication: No difficulties   Cognition Arousal/Alertness: Awake/alert Behavior During Therapy: WFL for tasks assessed/performed Overall Cognitive Status: Within Functional Limits for tasks assessed                                       General Comments  BP readings 90s/60s supine and in sitting; denies dizziness throughout. coordinated for PT for BP readings after mobility in hallway (70s/40s)    Exercises     Shoulder Instructions      Home Living Family/patient expects to be discharged to:: Private residence Living Arrangements: Alone Available Help at Discharge:  Family;Available PRN/intermittently Type of Home: Apartment Home Access: Level entry     Home Layout: One level     Bathroom Shower/Tub: Chief Strategy Officer: Standard Bathroom Accessibility: Yes   Home Equipment: Agricultural consultant (2 wheels)   Additional Comments: home O2 (wears it "most of the time"). Family can assist with transportation, groceries      Prior Functioning/Environment Prior Level of Function : Independent/Modified Independent;Driving             Mobility Comments: reports typically independent without AD, used RW initially after previous procedure ADLs Comments: Able to complete ADLs, no use of shower chair for bathing tasks. Reports typically able to complete IADLs, driving and grocery shopping but assistance provided after most recent procedure        OT Problem List: Decreased activity tolerance;Impaired balance (sitting and/or standing);Cardiopulmonary status limiting activity      OT Treatment/Interventions: Self-care/ADL training;Therapeutic exercise;DME and/or AE instruction;Energy conservation;Therapeutic activities;Patient/family education;Balance training    OT Goals(Current goals can be found in the care plan section) Acute Rehab OT Goals Patient Stated Goal: have wound care assist at home to optimize healing OT Goal Formulation: With patient Time For Goal Achievement: 07/20/21 Potential to Achieve Goals: Good  OT Frequency: Min 2X/week   Barriers to D/C:  Co-evaluation              AM-PAC OT "6 Clicks" Daily Activity     Outcome Measure Help from another person eating meals?: None Help from another person taking care of personal grooming?: A Little Help from another person toileting, which includes using toliet, bedpan, or urinal?: A Little Help from another person bathing (including washing, rinsing, drying)?: A Little Help from another person to put on and taking off regular upper body clothing?: A  Little Help from another person to put on and taking off regular lower body clothing?: A Little 6 Click Score: 19   End of Session Equipment Utilized During Treatment: Gait belt;Rolling walker (2 wheels);Oxygen Nurse Communication: Mobility status;Other (comment) (BP, request for wound care assist at home)  Activity Tolerance: Patient tolerated treatment well Patient left: Other (comment) (to ambulate in hallway with PT)  OT Visit Diagnosis: Unsteadiness on feet (R26.81);Other abnormalities of gait and mobility (R26.89)                Time: 1941-7408 OT Time Calculation (min): 25 min Charges:  OT General Charges $OT Visit: 1 Visit OT Evaluation $OT Eval Low Complexity: 1 Low OT Treatments $Self Care/Home Management : 8-22 mins  Bradd Canary, OTR/L Acute Rehab Services Office: [phone]   Lorre Munroe 07/06/2021, 8:35 AM

## 2021-07-06 NOTE — Progress Notes (Signed)
PHARMACIST LIPID MONITORING   Christopher Pena is a 75 y.o. male  with history of multiple left lower extremity revascularizations who is now admitted on 07/05/2021 with infected bypass graft. Pharmacy has been consulted to optimize lipid-lowering therapy with the indication of secondary prevention for clinical ASCVD.  Recent Labs:  Lipid Panel (last 6 months):   Lab Results  Component Value Date   CHOL 74 07/06/2021   TRIG 48 07/06/2021   HDL 45 07/06/2021   CHOLHDL 1.6 07/06/2021   VLDL 10 07/06/2021   LDLCALC 19 07/06/2021    Hepatic function panel (last 6 months):   Lab Results  Component Value Date   AST 26 07/05/2021   ALT 13 07/05/2021   ALKPHOS 64 07/05/2021   BILITOT 0.6 07/05/2021    SCr (since admission):   Serum creatinine: 1.12 mg/dL 49/67/59 1638 Estimated creatinine clearance: 53.3 mL/min  Current therapy and lipid therapy tolerance Current lipid-lowering therapy: rosuvastatin 20mg  QD Previous lipid-lowering therapies (if applicable): rosuvastatin 10mg   Documented or reported allergies or intolerances to lipid-lowering therapies (if applicable): N/A  Assessment:   Patient agrees with changes to lipid-lowering therapy  Plan:    1.Statin intensity (high intensity recommended for all patients regardless of the LDL):  No statin changes. The patient is already on a high intensity statin.  2.Add ezetimibe (if any one of the following):   Not indicated at this time.  3.Refer to lipid clinic:   No  4.Follow-up with:  Primary care provider - , MD  5.Follow-up labs after discharge:  No changes in lipid therapy, repeat a lipid panel in one year.      Thank you for allowing pharmacy to be a part of this patient's care.  , PharmD Clinical Pharmacist

## 2021-07-06 NOTE — Evaluation (Signed)
Physical Therapy Evaluation Patient Details Name: Christopher Pena MRN: [id number] DOB: 1946-03-06 Today's Date: 07/06/2021  History of Present Illness  Pt is a 75 y.o. male admitted on 07/05/21 with an infected bypass graft of the LLE. S/p bypass excision on 11/17. PMH includes claudication, non healing LE wound, tobacco use, COPD, chronic low back pain; of note, pt has undergone multiple LLE revascularizations and a R femoral to popliteal graft  Clinical Impression  Pt presents with generalized LE weakness, decreased functional mobility, and decreased activity tolerance secondary to above. PTA, pt was independent in ADLs and ambulated in his home and short distances in the community without DME (did use a RW initially after his last procedure). Currently, pt ambulating with supervision and RW. Soft BP at rest and with mobility with no complaints of dizziness. Recommend HH PT after discharge to increase functional mobility, exercise tolerance, and strength to maximize independence, as patient lives alone with intermittent help from family. Will continue to follow acutely to address short-term PT goals.   BP supine: 92/61 BP post-ambulation: 79/40 BP sitting ~5 minutes: 92/61      Recommendations for follow up therapy are one component of a multi-disciplinary discharge planning process, led by the attending physician.  Recommendations may be updated based on patient status, additional functional criteria and insurance authorization.  Follow Up Recommendations Home health PT    Assistance Recommended at Discharge PRN  Functional Status Assessment Patient has had a recent decline in their functional status and demonstrates the ability to make significant improvements in function in a reasonable and predictable amount of time.  Equipment Recommendations  None recommended by PT    Recommendations for Other Services       Precautions / Restrictions Precautions Precautions: Fall;Other  (comment) Precaution Comments: monitor O2, soft BP Restrictions Weight Bearing Restrictions: No      Mobility  Bed Mobility Overal bed mobility: Modified Independent             General bed mobility comments: HOB elevated, use of bed rails    Transfers Overall transfer level: Needs assistance Equipment used: Rolling walker (2 wheels) Transfers: Sit to/from Stand Sit to Stand: Min guard           General transfer comment: Pt stood from EOB with min gaurd from OT    Ambulation/Gait Ambulation/Gait assistance: Supervision Gait Distance (Feet): 120 Feet Assistive device: Rolling walker (2 wheels) Gait Pattern/deviations: Step-through pattern;Decreased stride length Gait velocity: decreased     General Gait Details: supevision for safety; pt took a couple short standing rest breaks  Stairs            Wheelchair Mobility    Modified Rankin (Stroke Patients Only)       Balance Overall balance assessment: Needs assistance Sitting-balance support: No upper extremity supported;Feet supported Sitting balance-Leahy Scale: Fair Sitting balance - Comments: Pt able to sit at edge of recliner with no UE support   Standing balance support: Bilateral upper extremity supported;No upper extremity supported;During functional activity Standing balance-Leahy Scale: Fair Standing balance comment: fair static standing, requested RW use of mobility                             Pertinent Vitals/Pain Pain Assessment: Faces Faces Pain Scale: Hurts little more Pain Location: L LE Pain Descriptors / Indicators: Operative site guarding Pain Intervention(s): Limited activity within patient's tolerance;Monitored during session    Home Living Family/patient expects to be  discharged to:: Private residence Living Arrangements: Alone Available Help at Discharge: Family;Available PRN/intermittently Type of Home: Apartment Home Access: Level entry       Home  Layout: One level Home Equipment: Agricultural consultant (2 wheels) Additional Comments: home O2 (wears it "most of the time"). Family can assist with transportation, groceries    Prior Function Prior Level of Function : Independent/Modified Independent;Driving             Mobility Comments: reports typically independent without DME, used RW initially after previous procedure. Uses electric buggy while shopping but can walk into the store with no DME. ADLs Comments: Able to complete ADLs, no use of shower chair for bathing tasks. Reports typically able to complete IADLs, driving and grocery shopping but assistance provided after most recent procedure     Hand Dominance   Dominant Hand: Right    Extremity/Trunk Assessment   Upper Extremity Assessment Upper Extremity Assessment: Overall WFL for tasks assessed    Lower Extremity Assessment Lower Extremity Assessment: Generalized weakness    Cervical / Trunk Assessment Cervical / Trunk Assessment: Normal  Communication   Communication: No difficulties  Cognition Arousal/Alertness: Awake/alert Behavior During Therapy: WFL for tasks assessed/performed Overall Cognitive Status: Within Functional Limits for tasks assessed                                          General Comments General comments (skin integrity, edema, etc.): BP 92/61 supine, 79/40 sitting after mobility, 92/61 at end of session, no reports of dizziness. SpO2 pleth unreliable during mobility, 99% on 3L after mobility. Educated pt on using RW for at least a few days when returning home to help offload pain and increase stability. Also talked to him about the importance of staying physically active - pt reported decreased motivation to move at home.    Exercises     Assessment/Plan    PT Assessment Patient needs continued PT services  PT Problem List Decreased strength;Decreased mobility;Decreased activity tolerance;Decreased balance       PT  Treatment Interventions DME instruction;Therapeutic exercise;Gait training;Balance training;Functional mobility training;Therapeutic activities;Patient/family education    PT Goals (Current goals can be found in the Care Plan section)  Acute Rehab PT Goals Patient Stated Goal: to return home PT Goal Formulation: With patient Time For Goal Achievement: 07/20/21 Potential to Achieve Goals: Good    Frequency Min 3X/week   Barriers to discharge        Co-evaluation               AM-PAC PT "6 Clicks" Mobility  Outcome Measure Help needed turning from your back to your side while in a flat bed without using bedrails?: A Little Help needed moving from lying on your back to sitting on the side of a flat bed without using bedrails?: A Little Help needed moving to and from a bed to a chair (including a wheelchair)?: A Little Help needed standing up from a chair using your arms (e.g., wheelchair or bedside chair)?: A Little Help needed to walk in hospital room?: A Little Help needed climbing 3-5 steps with a railing? : A Little 6 Click Score: 18    End of Session Equipment Utilized During Treatment: Gait belt Activity Tolerance: Patient tolerated treatment well Patient left: in chair;with call bell/phone within reach Nurse Communication: Mobility status;Other (comment) (notified nurse about soft BPs) PT Visit Diagnosis: Unsteadiness on  feet (R26.81);Other abnormalities of gait and mobility (R26.89);Muscle weakness (generalized) (M62.81);Pain Pain - Right/Left: Left Pain - part of body: Leg    Time: 0820-0838 PT Time Calculation (min) (ACUTE ONLY): 18 min   Charges:   PT Evaluation $PT Eval Low Complexity: 1 Low          Daryel November, SPT   Daryel November 07/06/2021, 10:38 AM

## 2021-07-06 NOTE — Anesthesia Postprocedure Evaluation (Signed)
Anesthesia Post Note  Patient: Christopher Pena  Procedure(s) Performed: IRRIGATION AND DEBRIDEMENT LEFT LOWER EXTREMITY (Left: Leg Lower) RESECTION OF LEFT LOWER EXTREMITY BYPASS GRAFT (Left: Leg Lower) VEIN HARVEST LEFT GREATER SAPHENOUS (Left: Leg Lower) ANTERIOR TIBIAL ARTERY PATCH USING GREATER SAPHENOUS VEIN (Left: Leg Lower)     Patient location during evaluation: PACU Anesthesia Type: General Level of consciousness: awake and alert Pain management: pain level controlled Vital Signs Assessment: post-procedure vital signs reviewed and stable Respiratory status: spontaneous breathing, nonlabored ventilation, respiratory function stable and patient connected to nasal cannula oxygen Cardiovascular status: blood pressure returned to baseline and stable Postop Assessment: no apparent nausea or vomiting Anesthetic complications: no   No notable events documented.  Last Vitals:  Vitals:   07/06/21 0345 07/06/21 0626  BP: (!) 101/58 101/66  Pulse: 96 88  Resp: 20 18  Temp: (!) 36.4 C 36.4 C  SpO2: 96% 97%    Last Pain:  Vitals:   07/06/21 0626  TempSrc: Oral  PainSc:                  Nelle Don Deajah Erkkila

## 2021-07-07 LAB — GLUCOSE, CAPILLARY
Glucose-Capillary: 139 mg/dL — ABNORMAL HIGH (ref 70–99)
Glucose-Capillary: 118 mg/dL — ABNORMAL HIGH (ref 70–99)
Glucose-Capillary: 98 mg/dL (ref 70–99)
Glucose-Capillary: 121 mg/dL — ABNORMAL HIGH (ref 70–99)

## 2021-07-07 NOTE — Progress Notes (Addendum)
[facility]  Subjective  - Doing OK no new complaints   Objective (!) 101/53 100 97.7 F (36.5 C) (Oral) 20 92%  Intake/Output Summary (Last 24 hours) at 07/07/2021 0914 Last data filed at 07/07/2021 9201 Gross per 24 hour  Intake 616.11 ml  Output 1125 ml  Net -508.89 ml                Peroneal left LE doppler signal, AT right LE signal Iodoform guaze D/C today, wet to dry packing at groin and BK open wounds, ABD lateral lower leg, dry guaze and ace wrap to left LE Left LE Motor intact, decreased erythema Lungs non labored breathing on O2 24 h/day prior to admission   Assessment/Planning: POD # 2 Left lower extremity bypass excision Left greater saphenous vein harvest Left anterior tibial artery vein patch  Inline flow with peroneal doppler signals Leukocytosis improved now 15.4 continue IV antibiotics, Culture no growth to date.  He lives alone will consult TOC  HH PT, RN for wound checks   Christopher Pena 07/07/2021 9:14 AM --  Laboratory Lab Results: Recent Labs    07/05/21 1539 07/05/21 2149 07/05/21 2305 07/06/21 0115  WBC 12.4*  --   --  20.9*  HGB 10.4*   < > 9.5* 8.6*  HCT 33.8*   < > 28.0* 28.7*  PLT 406*  --   --  498*   < > = values in this interval not displayed.   BMET Recent Labs    07/05/21 1539 07/05/21 2149 07/05/21 2305 07/06/21 0115  NA 134*   < > 135 137  K 3.3*   < > 4.1 4.5  CL 95*  --   --  102  CO2 26  --   --  27  GLUCOSE 146*  --   --  165*  BUN 9  --   --  10  CREATININE 1.00  --   --  1.12  CALCIUM 8.3*  --   --  7.8*   < > = values in this interval not displayed.    COAG Lab Results  Component Value Date   INR 1.2 07/05/2021   INR 1.1 12/07/2020   INR 1.0 12/06/2020   No results found for: PTT  VASCULAR STAFF ADDENDUM: I have independently interviewed and examined the patient. I agree with the above.  Looks good after excision of exposed prosthetic  vascular graft. Continue local wound care and antibiotics. Mobilize as able.  Rande Brunt. Lenell Antu, MD [facility] Phone Number: [phone] 07/07/2021 10:50 AM

## 2021-07-07 NOTE — Progress Notes (Signed)
Mobility Specialist: Progress Note   07/07/21 1407  Mobility  Activity Ambulated in hall  Level of Assistance Modified independent, requires aide device or extra time  Assistive Device Front wheel walker  Distance Ambulated (ft) 160 ft  Mobility Ambulated with assistance in hallway  Mobility Response Tolerated well  Mobility performed by Mobility specialist  Bed Position Chair  $Mobility charge 1 Mobility   Pre-Mobility on 3 L/min [place]: 80 HR, 101/65 BP, 100% SpO2 During Mobility 3 L/min [place]: 112 HR, 95% SpO2 Post-Mobility 2 L/min [place]: 110 HR, 123/56 BP, 93% SpO2  Pt began ambulation on 3 L/min [employer] but was able to wean down to 2 L/min  by the end of the walk, RN present in the room. Pt c/o 6-7/10 LLE pain, otherwise no c/o. Pt to recliner after walk with call bell at his side.   [facility] Noel Rodier Mobility Specialist Mobility Specialist Phone #1: [phone] Mobility Specialist Phone #2: [phone]

## 2021-07-08 ENCOUNTER — Inpatient Hospital Stay (HOSPITAL_COMMUNITY): Payer: Medicare Other

## 2021-07-08 LAB — CBC WITH DIFFERENTIAL/PLATELET
Abs Immature Granulocytes: 0.12 10*3/uL — ABNORMAL HIGH (ref 0.00–0.07)
Basophils Absolute: 0.1 10*3/uL (ref 0.0–0.1)
Basophils Relative: 1 %
Eosinophils Absolute: 0.1 10*3/uL (ref 0.0–0.5)
Eosinophils Relative: 1 %
HCT: 22.9 % — ABNORMAL LOW (ref 39.0–52.0)
Hemoglobin: 7 g/dL — ABNORMAL LOW (ref 13.0–17.0)
Immature Granulocytes: 1 %
Lymphocytes Relative: 6 %
Lymphs Abs: 0.6 10*3/uL — ABNORMAL LOW (ref 0.7–4.0)
MCH: 26.2 pg (ref 26.0–34.0)
MCHC: 30.6 g/dL (ref 30.0–36.0)
MCV: 85.8 fL (ref 80.0–100.0)
Monocytes Absolute: 1 10*3/uL (ref 0.1–1.0)
Monocytes Relative: 10 %
Neutro Abs: 8.5 10*3/uL — ABNORMAL HIGH (ref 1.7–7.7)
Neutrophils Relative %: 81 %
Platelets: 440 10*3/uL — ABNORMAL HIGH (ref 150–400)
RBC: 2.67 MIL/uL — ABNORMAL LOW (ref 4.22–5.81)
RDW: 15.1 % (ref 11.5–15.5)
WBC: 10.3 10*3/uL (ref 4.0–10.5)
nRBC: 0.3 % — ABNORMAL HIGH (ref 0.0–0.2)

## 2021-07-08 LAB — BASIC METABOLIC PANEL
Anion gap: 5 (ref 5–15)
Anion gap: 7 (ref 5–15)
BUN: 16 mg/dL (ref 8–23)
BUN: 12 mg/dL (ref 8–23)
CO2: 32 mmol/L (ref 22–32)
CO2: 29 mmol/L (ref 22–32)
Calcium: 7.6 mg/dL — ABNORMAL LOW (ref 8.9–10.3)
Calcium: 7.9 mg/dL — ABNORMAL LOW (ref 8.9–10.3)
Chloride: 101 mmol/L (ref 98–111)
Chloride: 99 mmol/L (ref 98–111)
Creatinine, Ser: 0.91 mg/dL (ref 0.61–1.24)
Creatinine, Ser: 0.98 mg/dL (ref 0.61–1.24)
GFR, Estimated: >60 mL/min (ref >60)
GFR, Estimated: >60 mL/min (ref >60)
Glucose, Bld: 113 mg/dL — ABNORMAL HIGH (ref 70–99)
Glucose, Bld: 119 mg/dL — ABNORMAL HIGH (ref 70–99)
Potassium: 3.8 mmol/L (ref 3.5–5.1)
Potassium: 4.1 mmol/L (ref 3.5–5.1)
Sodium: 138 mmol/L (ref 135–145)
Sodium: 135 mmol/L (ref 135–145)

## 2021-07-08 LAB — URINALYSIS, COMPLETE (UACMP) WITH MICROSCOPIC
Bacteria, UA: NONE SEEN
Bilirubin Urine: NEGATIVE
Glucose, UA: NEGATIVE mg/dL
Ketones, ur: NEGATIVE mg/dL
Leukocytes,Ua: NEGATIVE
Nitrite: NEGATIVE
Protein, ur: NEGATIVE mg/dL
Specific Gravity, Urine: 1.014 (ref 1.005–1.030)
pH: 7 (ref 5.0–8.0)

## 2021-07-08 LAB — LACTIC ACID, PLASMA
Lactic Acid, Venous: 0.9 mmol/L (ref 0.5–1.9)
Lactic Acid, Venous: 1.1 mmol/L (ref 0.5–1.9)

## 2021-07-08 LAB — PREPARE RBC (CROSSMATCH)

## 2021-07-08 LAB — GLUCOSE, CAPILLARY
Glucose-Capillary: 110 mg/dL — ABNORMAL HIGH (ref 70–99)
Glucose-Capillary: 116 mg/dL — ABNORMAL HIGH (ref 70–99)
Glucose-Capillary: 124 mg/dL — ABNORMAL HIGH (ref 70–99)
Glucose-Capillary: 110 mg/dL — ABNORMAL HIGH (ref 70–99)

## 2021-07-08 MED ORDER — HEPARIN (PORCINE) 25000 UT/250ML-% IV SOLN
1250.0000 [IU]/h | INTRAVENOUS | Status: DC
  Administered 2021-07-08 – 2021-07-09 (×2): 950 [IU]/h via INTRAVENOUS
  Filled 2021-07-08 (×3): qty 250

## 2021-07-08 MED ORDER — METOPROLOL TARTRATE 5 MG/5ML IV SOLN
5.0000 mg | Freq: Four times a day (QID) | INTRAVENOUS | Status: DC
  Administered 2021-07-08 – 2021-07-09 (×3): 5 mg via INTRAVENOUS
  Filled 2021-07-08 (×4): qty 5

## 2021-07-08 MED ORDER — IOHEXOL 350 MG/ML SOLN
100.0000 mL | Freq: Once | INTRAVENOUS | Status: AC | PRN
  Administered 2021-07-08 (×2): 100 mL via INTRAVENOUS

## 2021-07-08 MED ORDER — LACTATED RINGERS IV SOLN: INTRAVENOUS | Status: DC

## 2021-07-08 NOTE — Progress Notes (Signed)
ANTICOAGULATION CONSULT NOTE - Initial Consult  Pharmacy Consult for heparin Indication:  Ischemic leg and high risk for PE  Allergies  Allergen Reactions   Clindamycin/Lincomycin Diarrhea    Patient Measurements: Height: 5\' 7"  (170.2 cm) Weight: 72.6 kg (160 lb) (stated per pt) IBW/kg (Calculated) : 66.1 Heparin Dosing Weight: 61 kg  Vital Signs: Temp: 99.6 F (37.6 C) (11/20 1125) Temp Source: Oral (11/20 1125) BP: 117/84 (11/20 1125) Pulse Rate: 112 (11/20 1125)  Labs: Recent Labs    07/05/21 1539 07/05/21 2149 07/05/21 2305 07/06/21 0115 07/08/21 0106 07/08/21 1039  HGB 10.4*   < > 9.5* 8.6*  --  7.0*  HCT 33.8*   < > 28.0* 28.7*  --  22.9*  PLT 406*  --   --  498*  --  440*  LABPROT 15.3*  --   --   --   --   --   INR 1.2  --   --   --   --   --   CREATININE 1.00  --   --  1.12 0.91 0.98   < > = values in this interval not displayed.    Estimated Creatinine Clearance: 60.9 mL/min (by C-G formula based on SCr of 0.98 mg/dL).   Medical History: Past Medical History:  Diagnosis Date   Anxiety    Chronic low back pain    "degenerative spine dx'd ~ 7 yr ago" (06/08/2013)   Claudication [facility])    Constipation due to pain medication    COPD (chronic obstructive pulmonary disease) (HCC)    Hypertension    "moderately high; RX didn't help" (06/08/2013) - no longer on meds (as of 09/19/16)   Neuromuscular disorder (HCC)    degen spine   Non-healing wound of lower extremity 06/09/2013   PAD (peripheral artery disease), PTA/stent IDEV & chocolate baloon 06/08/13 04/13/2013   Severely reduced ABI's of 0.3 bilaterally    Tobacco use 06/09/2013   Unexplained weight loss     Medications:  Medications Prior to Admission  Medication Sig Dispense Refill Last Dose   acetaminophen (TYLENOL) 500 MG tablet Take 500 mg by mouth every 6 (six) hours as needed for headache.   unk   albuterol (VENTOLIN HFA) 108 (90 Base) MCG/ACT inhaler Inhale 2 puffs into the lungs every 6  (six) hours as needed for wheezing or shortness of breath. 18 g 3 07/05/2021   aspirin EC 81 MG EC tablet Take 1 tablet (81 mg total) by mouth daily at 6 (six) AM. Swallow whole.   07/05/2021   cilostazol (PLETAL) 100 MG tablet TAKE ONE TABLET BY MOUTH TWICE DAILY BEFORE meals (Patient taking differently: Take 100 mg by mouth 2 (two) times daily.) 180 tablet 1 07/04/2021 at 2100   clopidogrel (PLAVIX) 75 MG tablet Take 75 mg by mouth daily.   07/05/2021 at 0700   ipratropium-albuterol (DUONEB) 0.5-2.5 (3) MG/3ML SOLN TAKE THREE MLS VIA NEBULIZER EVERY 6 HOURS AS NEEDED (Patient taking differently: Inhale 3 mLs into the lungs every 6 (six) hours as needed (shortness of breath).) 360 mL 0 unk   naloxone (NARCAN) nasal spray 4 mg/0.1 mL SMARTSIG:In Nostril   unk   oxyCODONE (ROXICODONE) 15 MG immediate release tablet Take 15 mg by mouth every 4 (four) hours as needed for pain.   07/05/2021   rosuvastatin (CRESTOR) 20 MG tablet Take 1 tablet (20 mg total) by mouth daily. 30 tablet 11 07/05/2021   sertraline (ZOLOFT) 25 MG tablet Take 1 tablet (25 mg  total) by mouth daily. 90 tablet 2 07/05/2021   busPIRone (BUSPAR) 7.5 MG tablet TAKE 1 TABLET BY MOUTH TWICE A DAY (Patient not taking: Reported on 07/06/2021) 180 tablet 1 Not Taking   gabapentin (NEURONTIN) 300 MG capsule Take 1 capsule by mouth every 8 (eight) hours. (Patient not taking: Reported on 07/06/2021)   Not Taking   Scheduled:   aspirin EC  81 mg Oral Q0600   busPIRone  7.5 mg Oral BID   cilostazol  100 mg Oral QHS   docusate sodium  100 mg Oral Daily   gabapentin  300 mg Oral Q8H   heparin  5,000 Units Subcutaneous Q8H   insulin aspart  0-9 Units Subcutaneous TID WC   mouth rinse  15 mL Mouth Rinse BID   metoprolol tartrate  5 mg Intravenous Q6H   oxyCODONE  15 mg Oral Q4H   pantoprazole  40 mg Oral Daily   potassium chloride  20-40 mEq Oral Once   rosuvastatin  20 mg Oral Daily   sertraline  25 mg Oral Daily   Infusions:   sodium  chloride     sodium chloride Stopped (07/06/21 1453)   lactated ringers 100 mL/hr at 07/08/21 0955   magnesium sulfate bolus IVPB     piperacillin-tazobactam (ZOSYN)  IV 3.375 g (07/08/21 1008)   vancomycin 1,500 mg (07/07/21 1709)    Assessment: Patient reports increased soreness in left leg and shortness of breath. Pharmacy consulted to start heparin for limb ischemia and risk for PE. Hemoglobin is 7 today, MD aware. Platelets are normal. Patient is on concomitant aspirin and Pletal. No signs or symptoms of bleeding noted, however patient is high risk. Will initiate heparin infusion at lower dose with careful titration.  Goal of Therapy:  Heparin level 0.3-0.7 units/ml Monitor platelets by anticoagulation protocol: Yes   Plan:  Start heparin 950 units/hour Heparin level in 8 hours Daily heparin level and CBC  Thank you for allowing pharmacy to participate in this patient's care.  Enos Fling, PharmD PGY1 Pharmacy Resident 07/08/2021 1:06 PM Check [email or web address] for unit specific pharmacy number

## 2021-07-08 NOTE — Progress Notes (Addendum)
[facility]  Subjective  - Increased soreness in the left leg.   Objective (!) 121/55 (!) 118 97.9 F (36.6 C) (Oral) 20 92%  Intake/Output Summary (Last 24 hours) at 07/08/2021 0834 Last data filed at 07/08/2021 0805 Gross per 24 hour  Intake 559.68 ml  Output 1275 ml  Net -715.32 ml    Left LE dressing clean and dry Peroneal signal via doppler intact Left LE Motor intact, toes/SLR Lungs non labored breathing O2 24 hours at home Heart Tachy 118-132, BP stable 121/55   Assessment/Planning: POD # 3  Left lower extremity bypass excision Left greater saphenous vein harvest Left anterior tibial artery vein patch  Inflow peroneal intact signal Motor intact, denise chills, afebrile with leukocytosis WBC 07/06/21 20.9.  Will draw cbc in the am.  Continue empiric IV antibiotics.  Culture without growth to date. Will change dressing tomorrow with Dr. Karin Lieu. Christopher Pena is ambulatory and has a daughter that helps with Christopher Pena care some.  Notified by RN tachy 124 bpm sustained, Tm 102, oxygen demand increased requiring 5L [place] with O2 SAT 87.  Christopher Pena is alert and oriented x 3.  Limp well perfused  Ordered CBC, lactic acid, blood cultures, CTA r/o PE, EKG and metoprolol IV 5 mg QID.    Christopher Pena 07/08/2021 8:34 AM --  Laboratory Lab Results: Recent Labs    07/05/21 1539 07/05/21 2149 07/05/21 2305 07/06/21 0115  WBC 12.4*  --   --  20.9*  HGB 10.4*   < > 9.5* 8.6*  HCT 33.8*   < > 28.0* 28.7*  PLT 406*  --   --  498*   < > = values in this interval not displayed.   BMET Recent Labs    07/06/21 0115 07/08/21 0106  NA 137 138  K 4.5 3.8  CL 102 101  CO2 27 32  GLUCOSE 165* 113*  BUN 10 16  CREATININE 1.12 0.91  CALCIUM 7.8* 7.6*    COAG Lab Results  Component Value Date   INR 1.2 07/05/2021   INR 1.1 12/07/2020   INR 1.0 12/06/2020   No results found for: PTT  VASCULAR STAFF ADDENDUM: I have independently  interviewed and examined the Christopher Pena. I agree with the above.  Tachycardia, increased oxygen requirement, fever this AM. Check labs, EKG, CT angiogram chest to look for PE. Start metoprolol. Start heparin.  Rande Brunt. Lenell Antu, MD [facility] Phone Number: [phone] 07/08/2021 11:28 AM

## 2021-07-09 LAB — TYPE AND SCREEN
ABO/RH(D): A NEG
Antibody Screen: NEGATIVE
Unit division: 0

## 2021-07-09 LAB — BPAM RBC
Blood Product Expiration Date: 202211292359
ISSUE DATE / TIME: 202211202101
Unit Type and Rh: 600

## 2021-07-09 LAB — AEROBIC/ANAEROBIC CULTURE W GRAM STAIN (SURGICAL/DEEP WOUND): Culture: NEGATIVE

## 2021-07-09 LAB — CBC
HCT: 25.3 % — ABNORMAL LOW (ref 39.0–52.0)
Hemoglobin: 7.8 g/dL — ABNORMAL LOW (ref 13.0–17.0)
MCH: 26.5 pg (ref 26.0–34.0)
MCHC: 30.8 g/dL (ref 30.0–36.0)
MCV: 86.1 fL (ref 80.0–100.0)
Platelets: 387 10*3/uL (ref 150–400)
RBC: 2.94 MIL/uL — ABNORMAL LOW (ref 4.22–5.81)
RDW: 15.3 % (ref 11.5–15.5)
WBC: 12.4 10*3/uL — ABNORMAL HIGH (ref 4.0–10.5)
nRBC: 0.2 % (ref 0.0–0.2)

## 2021-07-09 LAB — GLUCOSE, CAPILLARY
Glucose-Capillary: 111 mg/dL — ABNORMAL HIGH (ref 70–99)
Glucose-Capillary: 110 mg/dL — ABNORMAL HIGH (ref 70–99)
Glucose-Capillary: 121 mg/dL — ABNORMAL HIGH (ref 70–99)

## 2021-07-09 LAB — HEPARIN LEVEL (UNFRACTIONATED): Heparin Unfractionated: 0.4 IU/mL (ref 0.30–0.70)

## 2021-07-09 MED ORDER — VANCOMYCIN HCL 1250 MG/250ML IV SOLN
1250.0000 mg | INTRAVENOUS | Status: AC
  Administered 2021-07-09 – 2021-07-11 (×3): 1250 mg via INTRAVENOUS
  Filled 2021-07-09 (×4): qty 250

## 2021-07-09 MED ORDER — METOPROLOL SUCCINATE ER 50 MG PO TB24: 50.0000 mg | ORAL_TABLET | Freq: Every day | ORAL | Status: DC

## 2021-07-09 MED ORDER — LOPERAMIDE HCL 2 MG PO CAPS
2.0000 mg | ORAL_CAPSULE | ORAL | Status: DC | PRN
  Administered 2021-07-10: 2 mg via ORAL
  Filled 2021-07-09: qty 1

## 2021-07-09 NOTE — Progress Notes (Signed)
Pharmacy Antibiotic Note  Christopher Pena is a 75 y.o. male admitted on 07/05/2021 with PAD and known chronically-occluded LLE bypass, and cellulitis.  Pharmacy has been consulted for vancomycin dosing X 7 days. Also on piperacillin/tazobactam per team, which puits him at risk for AKI.     Tissue culture growing rare finegoldia. Per MD, prefer to keep current antibiotics given large amount of pus from wound in OR.   11/21 Vancomycin 1500mg  Q 24 hr Scr used: 1 mg/dL Weight: kg Vd coeff: 0.72 L/kg Est AUC: 443  Plan: Decrease vancomycin to 1250mg  IV Q24H Piperacillin/tazobactam  per team  Both abx ending 11/23, order vanc level if prolonging course Monitor cultures, clinical status, renal fx   Height: 5\' 7"  (170.2 cm) Weight: 72.6 kg (160 lb) (stated per pt) IBW/kg (Calculated) : 66.1  Temp (24hrs), Avg:99.1 F (37.3 C), Min:97.6 F (36.4 C), Max:102.6 F (39.2 C)  Recent Labs  Lab 07/05/21 1539 07/06/21 0115 07/08/21 0106 07/08/21 1039 07/08/21 1040 07/08/21 1241 07/09/21 0341  WBC 12.4* 20.9*  --  10.3  --   --  12.4*  CREATININE 1.00 1.12 0.91 0.98  --   --   --   LATICACIDVEN  --   --   --   --  0.9 1.1  --   VANCORANDOM  --  23  --   --   --   --   --      Estimated Creatinine Clearance: 60.9 mL/min (by C-G formula based on SCr of 0.98 mg/dL).    Allergies  Allergen Reactions   Clindamycin/Lincomycin Diarrhea    Antimicrobials this admission: Zosyn 11/17 >>  11/23 Vanc   11/17 >>  11/23  Microbiology results: 11/17 Tissue: rare finegoldia magna  11/18 MRSA neg  11/20 bcx:  pend   Thank you for allowing pharmacy to be a part of this patient's care.  12/17, PharmD, BCPS, BCCP Clinical Pharmacist  Please check AMION for all [facility] Pharmacy phone numbers After 10:00 PM, call Main Pharmacy [phone]

## 2021-07-09 NOTE — Progress Notes (Signed)
CH attempted to visit pt. per [facility] consult for assistance w/AD but pt. was asleep.  Chaplains will follow up at a later time.

## 2021-07-09 NOTE — Care Management Important Message (Signed)
Important Message  Patient Details  Name: Christopher Pena MRN: [id number] Date of Birth: [birth year]   Medicare Important Message Given:  Yes     Renie Ora 07/09/2021, 9:46 AM

## 2021-07-09 NOTE — Progress Notes (Signed)
Occupational Therapy Treatment Patient Details Name: Christopher Pena MRN: [id number] DOB: 15-Jan-1946 Today's Date: 07/09/2021   History of present illness Pt is a 75 y.o. male admitted on 07/05/21 with an infected bypass graft of the LLE. S/p bypass excision on 11/17. PMH includes claudication, non healing LE wound, tobacco use, COPD, chronic low back pain; of note, pt has undergone multiple LLE revascularizations and a R femoral to popliteal graft.   OT comments  Pt progressing towards OT goals with session focused on safety with tub transfers at home once cleared for showering tasks. Pt able to complete simulated tub transfer at min guard with one UE support (has grab bars at home). Noted limitations in balance with mobility without DME use, so recommended pt use RW for mobility initially at home. Began education on techniques to gather items at home safely while using RW with plans to further assess s/p planned debridement tomorrow. DC recs remain appropriate.    Recommendations for follow up therapy are one component of a multi-disciplinary discharge planning process, led by the attending physician.  Recommendations may be updated based on patient status, additional functional criteria and insurance authorization.    Follow Up Recommendations  No OT follow up    Assistance Recommended at Discharge PRN  Equipment Recommendations  None recommended by OT    Recommendations for Other Services      Precautions / Restrictions Precautions Precautions: Fall;Other (comment) Precaution Comments: monitor O2, soft BP Restrictions Weight Bearing Restrictions: No       Mobility Bed Mobility Overal bed mobility: Modified Independent             General bed mobility comments: up in chair on entry    Transfers Overall transfer level: Needs assistance Equipment used: None Transfers: Sit to/from Stand Sit to Stand: Supervision           General transfer comment: stood from EOB;  cues for hand placement with RW, supervision for safety. Able to stand from commode with increased time and use of grab bar     Balance Overall balance assessment: Needs assistance Sitting-balance support: No upper extremity supported;Feet supported Sitting balance-Leahy Scale: Good Sitting balance - Comments: able to sit EOB with no UE support or physical assist   Standing balance support: No upper extremity supported;During functional activity Standing balance-Leahy Scale: Fair Standing balance comment: fair static standing, reaches out for support if not using RW for mobility                           ADL either performed or assessed with clinical judgement   ADL Overall ADL's : Needs assistance/impaired                       Lower Body Dressing Details (indicate cue type and reason): reports being able to don socks over the weekend without assist and without difficulty         Tub/ Shower Transfer: Tub transfer;Min guard;Ambulation Tub/Shower Transfer Details (indicate cue type and reason): simulated tub transfer and use of grab bars based on pt setup. Pt able to hold to "grab bars" and step over tub height x 2 without difficulty weightbearing through L LE Functional mobility during ADLs: Min guard General ADL Comments: Trialed without AD for short distance with limitations noted and pt reaching out for support. Educated on use of RW at home initially and strategies for carrying items with RW at home.  Extremity/Trunk Assessment Upper Extremity Assessment Upper Extremity Assessment: Overall WFL for tasks assessed   Lower Extremity Assessment Lower Extremity Assessment: Defer to PT evaluation        Vision   Vision Assessment?: No apparent visual deficits   Perception     Praxis      Cognition Arousal/Alertness: Awake/alert Behavior During Therapy: WFL for tasks assessed/performed Overall Cognitive Status: No family/caregiver present to  determine baseline cognitive functioning Area of Impairment: Safety/judgement;Following commands                       Following Commands: Follows one step commands with increased time Safety/Judgement: Decreased awareness of safety     General Comments: some decreased awareness of deficits, need for DME but receptive to education. Pleasant, follows all directions          Exercises     Shoulder Instructions       General Comments SpO2 WFL on 3 L O2    Pertinent Vitals/ Pain       Pain Assessment: 0-10 Pain Score: 6  Pain Location: L LE Pain Descriptors / Indicators: Operative site guarding Pain Intervention(s): Monitored during session;Limited activity within patient's tolerance;RN gave pain meds during session  Home Living                                          Prior Functioning/Environment              Frequency  Min 2X/week        Progress Toward Goals  OT Goals(current goals can now be found in the care plan section)  Progress towards OT goals: Progressing toward goals  Acute Rehab OT Goals Patient Stated Goal: for wound to heal completely OT Goal Formulation: With patient Time For Goal Achievement: 07/20/21 Potential to Achieve Goals: Good ADL Goals Pt Will Perform Lower Body Bathing: with modified independence;sit to/from stand Pt Will Transfer to Toilet: with modified independence;ambulating Pt Will Perform Tub/Shower Transfer: Tub transfer;with modified independence;ambulating;rolling walker Additional ADL Goal #1: Pt to gather ADL items in room at Mod I  Plan Discharge plan remains appropriate    Co-evaluation                 AM-PAC OT "6 Clicks" Daily Activity     Outcome Measure   Help from another person eating meals?: None Help from another person taking care of personal grooming?: A Little Help from another person toileting, which includes using toliet, bedpan, or urinal?: A Little Help from  another person bathing (including washing, rinsing, drying)?: A Little Help from another person to put on and taking off regular upper body clothing?: A Little Help from another person to put on and taking off regular lower body clothing?: A Little 6 Click Score: 19    End of Session Equipment Utilized During Treatment: Oxygen  OT Visit Diagnosis: Unsteadiness on feet (R26.81);Other abnormalities of gait and mobility (R26.89)   Activity Tolerance Patient tolerated treatment well   Patient Left in chair;with call bell/phone within reach   Nurse Communication Mobility status;Other (comment) (placement of BSC close to recliner due to pt diarrhea)        Time: 6073-7106 OT Time Calculation (min): 18 min  Charges: OT General Charges $OT Visit: 1 Visit OT Treatments $Self Care/Home Management : 8-22 mins  Bradd Canary, OTR/L Acute Rehab Services Office:  [phone]   Lorre Munroe 07/09/2021, 9:57 AM

## 2021-07-09 NOTE — Progress Notes (Signed)
    RN addressed change in medication from 5mg  metoprolol to 50 QD.  He had an event of tachycardia with TM 102 and decreased O2 SAT 07/08/21.     Vitals:   07/09/21 0343 07/09/21 0744  BP: 111/64 122/69  Pulse: 85 80  Resp: 18 16  Temp: 97.8 F (36.6 C) 97.6 F (36.4 C)  SpO2: 91% 96%    Vitals stable currently with improved HR.  He is actively working with PT.  I have D/C'd the metoprolol.  We will observe there are PRN ordered's for Tachycardia.    07/11/21  PA-c

## 2021-07-09 NOTE — Progress Notes (Signed)
[facility]  Subjective  - pain improved   Objective 122/69 80 97.6 F (36.4 C) (Oral) 16 96%  Intake/Output Summary (Last 24 hours) at 07/09/2021 0746 Last data filed at 07/09/2021 0744 Gross per 24 hour  Intake 2539.88 ml  Output 1800 ml  Net 739.88 ml     Left LE dressing clean and dry Peroneal signal via doppler intact Left LE, DP signal present, venous PT signal Motor intact, toes/SLR Lungs non labored breathing O2 24 hours at home Heart Tachy 118-132, BP stable 121/55   Assessment/Planning: 4 Days Post-Op Left lower extremity bypass excision Left greater saphenous vein harvest Left anterior tibial artery vein patch  Inflow peroneal intact peroneal, DP, PT signals Motor intact, denies,chills, afebrile  Leukocytosis improved.  Daily CBC, BMP. Continue empiric IV antibiotics.  Culture without growth to date. Will change dressing tomorrow with Dr. Karin Lieu. He is ambulatory and has a daughter that helps with his care some.  OR tomorrow for wound debridement and washout of left tibial incision NPO midnight   Christopher Pena 07/09/2021 7:46 AM --  Laboratory Lab Results: Recent Labs    07/08/21 1039 07/09/21 0341  WBC 10.3 12.4*  HGB 7.0* 7.8*  HCT 22.9* 25.3*  PLT 440* 387    BMET Recent Labs    07/08/21 0106 07/08/21 1039  NA 138 135  K 3.8 4.1  CL 101 99  CO2 32 29  GLUCOSE 113* 119*  BUN 16 12  CREATININE 0.91 0.98  CALCIUM 7.6* 7.9*     COAG Lab Results  Component Value Date   INR 1.2 07/05/2021   INR 1.1 12/07/2020   INR 1.0 12/06/2020   No results found for: PTT  VASCULAR STAFF ADDENDUM: I have independently interviewed and examined the patient. I agree with the above.  Tachycardia, increased oxygen requirement, fever this AM. Check labs, EKG, CT angiogram chest to look for PE. Start metoprolol. Start heparin.  Rande Brunt. Lenell Antu, MD [facility]  Phone Number: [phone] 07/09/2021 7:46 AM

## 2021-07-09 NOTE — Progress Notes (Signed)
Physical Therapy Treatment Patient Details Name: Christopher Pena MRN: [id number] DOB: 1945/11/17 Today's Date: 07/09/2021   History of Present Illness Pt is a 75 y.o. male admitted on 07/05/21 with an infected bypass graft of the LLE. S/p bypass excision on 11/17. PMH includes claudication, non healing LE wound, tobacco use, COPD, chronic low back pain; of note, pt has undergone multiple LLE revascularizations and a R femoral to popliteal graft.    PT Comments    Pt is progressing with mobility. Today's session focused on functional activities. Pt able to transfer and ambulate to restroom with supervision. Further OOB mobility limited by worry of having to use restroom again. Pt continues to be limited by generalized weakness and decreased activity tolerance. Continue to recommend [facility] PT after discharge to maximize independence. Will continue to follow acutely to address short-term PT goals.   Recommendations for follow up therapy are one component of a multi-disciplinary discharge planning process, led by the attending physician.  Recommendations may be updated based on patient status, additional functional criteria and insurance authorization.  Follow Up Recommendations  Home health PT     Assistance Recommended at Discharge PRN  Equipment Recommendations  None recommended by PT    Recommendations for Other Services       Precautions / Restrictions Precautions Precautions: Fall;Other (comment) Precaution Comments: monitor O2, soft BP Restrictions Weight Bearing Restrictions: No     Mobility  Bed Mobility Overal bed mobility: Modified Independent             General bed mobility comments: HOB elevated, use of bed rails    Transfers Overall transfer level: Needs assistance Equipment used: Rolling walker (2 wheels) Transfers: Sit to/from Stand Sit to Stand: Supervision           General transfer comment: stood from EOB; cues for hand placement with RW,  supervision for safety. Able to stand from commode with increased time and use of grab bar    Ambulation/Gait Ambulation/Gait assistance: Supervision Gait Distance (Feet): 30 Feet Assistive device: Rolling walker (2 wheels) Gait Pattern/deviations: Step-through pattern;Decreased stride length Gait velocity: decreased     General Gait Details: Ambulated to restroom; supervision for safety. Did not complete further ambulation due to pt having to use the restroom.   Stairs             Wheelchair Mobility    Modified Rankin (Stroke Patients Only)       Balance Overall balance assessment: Needs assistance Sitting-balance support: No upper extremity supported;Feet supported Sitting balance-Leahy Scale: Fair Sitting balance - Comments: able to sit EOB with no UE support or physical assist   Standing balance support: Bilateral upper extremity supported;During functional activity Standing balance-Leahy Scale: Fair Standing balance comment: use of RW during static standing and mobility                            Cognition Arousal/Alertness: Awake/alert Behavior During Therapy: WFL for tasks assessed/performed Overall Cognitive Status: No family/caregiver present to determine baseline cognitive functioning Area of Impairment: Safety/judgement;Following commands                       Following Commands: Follows one step commands with increased time Safety/Judgement: Decreased awareness of safety     General Comments: Required some repeition of commands but able to follow; cues for safe RW use        Exercises      General  Comments General comments (skin integrity, edema, etc.): BP 114/67 supine, 2L O2 throughout treatment      Pertinent Vitals/Pain Pain Assessment: No/denies pain    Home Living                          Prior Function            PT Goals (current goals can now be found in the care plan section) Acute Rehab PT  Goals Patient Stated Goal: to return home PT Goal Formulation: With patient Time For Goal Achievement: 07/20/21 Potential to Achieve Goals: Good Progress towards PT goals: Progressing toward goals    Frequency    Min 3X/week      PT Plan Current plan remains appropriate    Co-evaluation              AM-PAC PT "6 Clicks" Mobility   Outcome Measure  Help needed turning from your back to your side while in a flat bed without using bedrails?: A Little Help needed moving from lying on your back to sitting on the side of a flat bed without using bedrails?: A Little Help needed moving to and from a bed to a chair (including a wheelchair)?: A Little Help needed standing up from a chair using your arms (e.g., wheelchair or bedside chair)?: A Little Help needed to walk in hospital room?: A Little Help needed climbing 3-5 steps with a railing? : A Little 6 Click Score: 18    End of Session Equipment Utilized During Treatment: Gait belt Activity Tolerance: Patient tolerated treatment well Patient left: in chair;with nursing/sitter in room;Other (comment) (with OT in room) Nurse Communication: Mobility status PT Visit Diagnosis: Unsteadiness on feet (R26.81);Other abnormalities of gait and mobility (R26.89);Muscle weakness (generalized) (M62.81)     Time: 6967-8938 PT Time Calculation (min) (ACUTE ONLY): 36 min  Charges:  $Therapeutic Activity: 8-22 mins                     Daryel November, SPT    Daryel November 07/09/2021, 9:20 AM

## 2021-07-09 NOTE — Progress Notes (Signed)
ANTICOAGULATION CONSULT NOTE - Initial Consult  Pharmacy Consult for heparin Indication: severe LLE PAD, hx thrombectomy   Allergies  Allergen Reactions   Clindamycin/Lincomycin Diarrhea    Patient Measurements: Height: 5\' 7"  (170.2 cm) Weight: 72.6 kg (160 lb) (stated per pt) IBW/kg (Calculated) : 66.1 Heparin Dosing Weight: 61 kg  Vital Signs: Temp: 97.6 F (36.4 C) (11/21 0744) Temp Source: Oral (11/21 0744) BP: 122/69 (11/21 0744) Pulse Rate: 80 (11/21 0744)  Labs: Recent Labs    07/08/21 0106 07/08/21 1039 07/09/21 0341  HGB  --  7.0* 7.8*  HCT  --  22.9* 25.3*  PLT  --  440* 387  HEPARINUNFRC  --   --  0.40  CREATININE 0.91 0.98  --      Estimated Creatinine Clearance: 60.9 mL/min (by C-G formula based on SCr of 0.98 mg/dL).   Medical History: Past Medical History:  Diagnosis Date   Anxiety    Chronic low back pain    "degenerative spine dx'd ~ 7 yr ago" (06/08/2013)   Claudication [facility])    Constipation due to pain medication    COPD (chronic obstructive pulmonary disease) (HCC)    Hypertension    "moderately high; RX didn't help" (06/08/2013) - no longer on meds (as of 09/19/16)   Neuromuscular disorder (HCC)    degen spine   Non-healing wound of lower extremity 06/09/2013   PAD (peripheral artery disease), PTA/stent IDEV & chocolate baloon 06/08/13 04/13/2013   Severely reduced ABI's of 0.3 bilaterally    Tobacco use 06/09/2013   Unexplained weight loss     Medications:  Medications Prior to Admission  Medication Sig Dispense Refill Last Dose   acetaminophen (TYLENOL) 500 MG tablet Take 500 mg by mouth every 6 (six) hours as needed for headache.   unk   albuterol (VENTOLIN HFA) 108 (90 Base) MCG/ACT inhaler Inhale 2 puffs into the lungs every 6 (six) hours as needed for wheezing or shortness of breath. 18 g 3 07/05/2021   aspirin EC 81 MG EC tablet Take 1 tablet (81 mg total) by mouth daily at 6 (six) AM. Swallow whole.   07/05/2021   cilostazol  (PLETAL) 100 MG tablet TAKE ONE TABLET BY MOUTH TWICE DAILY BEFORE meals (Patient taking differently: Take 100 mg by mouth 2 (two) times daily.) 180 tablet 1 07/04/2021 at 2100   clopidogrel (PLAVIX) 75 MG tablet Take 75 mg by mouth daily.   07/05/2021 at 0700   ipratropium-albuterol (DUONEB) 0.5-2.5 (3) MG/3ML SOLN TAKE THREE MLS VIA NEBULIZER EVERY 6 HOURS AS NEEDED (Patient taking differently: Inhale 3 mLs into the lungs every 6 (six) hours as needed (shortness of breath).) 360 mL 0 unk   naloxone (NARCAN) nasal spray 4 mg/0.1 mL SMARTSIG:In Nostril   unk   oxyCODONE (ROXICODONE) 15 MG immediate release tablet Take 15 mg by mouth every 4 (four) hours as needed for pain.   07/05/2021   rosuvastatin (CRESTOR) 20 MG tablet Take 1 tablet (20 mg total) by mouth daily. 30 tablet 11 07/05/2021   sertraline (ZOLOFT) 25 MG tablet Take 1 tablet (25 mg total) by mouth daily. 90 tablet 2 07/05/2021   busPIRone (BUSPAR) 7.5 MG tablet TAKE 1 TABLET BY MOUTH TWICE A DAY (Patient not taking: Reported on 07/06/2021) 180 tablet 1 Not Taking   gabapentin (NEURONTIN) 300 MG capsule Take 1 capsule by mouth every 8 (eight) hours. (Patient not taking: Reported on 07/06/2021)   Not Taking   Scheduled:   aspirin EC  81  mg Oral Q0600   busPIRone  7.5 mg Oral BID   cilostazol  100 mg Oral QHS   docusate sodium  100 mg Oral Daily   gabapentin  300 mg Oral Q8H   insulin aspart  0-9 Units Subcutaneous TID WC   mouth rinse  15 mL Mouth Rinse BID   metoprolol tartrate  5 mg Intravenous Q6H   oxyCODONE  15 mg Oral Q4H   pantoprazole  40 mg Oral Daily   potassium chloride  20-40 mEq Oral Once   rosuvastatin  20 mg Oral Daily   sertraline  25 mg Oral Daily   Infusions:   sodium chloride     sodium chloride Stopped (07/06/21 1453)   heparin 950 Units/hr (07/08/21 1536)   lactated ringers 100 mL/hr at 07/08/21 0955   magnesium sulfate bolus IVPB     piperacillin-tazobactam (ZOSYN)  IV 3.375 g (07/09/21 0046)    vancomycin 1,500 mg (07/08/21 1847)    Assessment: 75 yo M with history of RLE bypass c/b occlusion and thrombectomy as well as hx LLE femoral thrombectomy with left iliofemoral endarterectomy with bovine pericardial patch angioplasty by Dr. Myra Gianotti on 12/08/2020 with chronically occluded bypass graft. Presented to clinic 11/17 with infected bypass hanging out of his leg. Planning for left lower extremity bypass excision, left leg revascularization and is at high risk for decreased perfusion and ischemia. No anticoagulation prior to admission. Pharmacy consulted for heparin.    Heparin level 0.4 is therapeutic on 950 units/hr. H/H 7.8 after 1 unit RBC 11/20, plt stable. Will return to OR 11/22 for wound debridement and washout of left tibial incision.  Goal of Therapy:  Heparin level 0.3-0.7 units/ml Monitor platelets by anticoagulation protocol: Yes   Plan:  Continue heparin 950 units/hour Monitor daily HL, CBC/plt Monitor for signs/symptoms of bleeding    Thank you for allowing pharmacy to participate in this patient's care.  Alphia Moh, PharmD, BCPS, BCCP Clinical Pharmacist  Please check AMION for all [facility] Pharmacy phone numbers After 10:00 PM, call Main Pharmacy [phone]

## 2021-07-09 NOTE — Progress Notes (Signed)
Mobility Specialist: Progress Note   07/09/21 1315  Mobility  Activity Ambulated in hall  Level of Assistance Standby assist, set-up cues, supervision of patient - no hands on  Assistive Device Front wheel walker  Distance Ambulated (ft) 190 ft  Mobility Ambulated with assistance in hallway  Mobility Response Tolerated well  Mobility performed by Mobility specialist  Bed Position Chair  $Mobility charge 1 Mobility   Pre-Mobility: 81 HR, 100%SpO2 During Mobility: 104 HR Post-Mobility: 98 HR, 92% SpO2  Pt ambulated on 2 L/min [place] during ambulation. Pt had no c/o during ambulation. Pt to recliner after walk with call bell and phone at his side.   [facility] Christopher Pena Mobility Specialist Mobility Specialist Phone #1: [phone] Mobility Specialist Phone #2: [phone]

## 2021-07-10 ENCOUNTER — Encounter (HOSPITAL_COMMUNITY)
Admission: AD | Disposition: A | Discharge status: Home / Self Care | Payer: Self-pay | Source: Ambulatory Visit | Attending: Vascular Surgery

## 2021-07-10 ENCOUNTER — Inpatient Hospital Stay (HOSPITAL_COMMUNITY): Payer: Medicare Other | Admitting: Anesthesiology

## 2021-07-10 ENCOUNTER — Encounter (HOSPITAL_COMMUNITY): Payer: Self-pay | Admitting: Vascular Surgery

## 2021-07-10 HISTORY — PX: I & D EXTREMITY: SHX5045

## 2021-07-10 LAB — CBC
HCT: 26.4 % — ABNORMAL LOW (ref 39.0–52.0)
Hemoglobin: 8 g/dL — ABNORMAL LOW (ref 13.0–17.0)
MCH: 26.5 pg (ref 26.0–34.0)
MCHC: 30.3 g/dL (ref 30.0–36.0)
MCV: 87.4 fL (ref 80.0–100.0)
Platelets: 425 10*3/uL — ABNORMAL HIGH (ref 150–400)
RBC: 3.02 MIL/uL — ABNORMAL LOW (ref 4.22–5.81)
RDW: 15.6 % — ABNORMAL HIGH (ref 11.5–15.5)
WBC: 13.8 10*3/uL — ABNORMAL HIGH (ref 4.0–10.5)
nRBC: 0 % (ref 0.0–0.2)

## 2021-07-10 LAB — BASIC METABOLIC PANEL
Anion gap: 7 (ref 5–15)
BUN: 12 mg/dL (ref 8–23)
CO2: 30 mmol/L (ref 22–32)
Calcium: 8.1 mg/dL — ABNORMAL LOW (ref 8.9–10.3)
Chloride: 99 mmol/L (ref 98–111)
Creatinine, Ser: 0.82 mg/dL (ref 0.61–1.24)
GFR, Estimated: >60 mL/min (ref >60)
Glucose, Bld: 118 mg/dL — ABNORMAL HIGH (ref 70–99)
Potassium: 4.2 mmol/L (ref 3.5–5.1)
Sodium: 136 mmol/L (ref 135–145)

## 2021-07-10 LAB — GLUCOSE, CAPILLARY
Glucose-Capillary: 99 mg/dL (ref 70–99)
Glucose-Capillary: 96 mg/dL (ref 70–99)
Glucose-Capillary: 104 mg/dL — ABNORMAL HIGH (ref 70–99)
Glucose-Capillary: 126 mg/dL — ABNORMAL HIGH (ref 70–99)

## 2021-07-10 LAB — HEPARIN LEVEL (UNFRACTIONATED)
Heparin Unfractionated: 0.25 IU/mL — ABNORMAL LOW (ref 0.30–0.70)
Heparin Unfractionated: 0.12 IU/mL — ABNORMAL LOW (ref 0.30–0.70)

## 2021-07-10 SURGERY — IRRIGATION AND DEBRIDEMENT EXTREMITY
Anesthesia: General | Laterality: Left

## 2021-07-10 MED ORDER — FENTANYL CITRATE (PF) 250 MCG/5ML IJ SOLN
INTRAMUSCULAR | Status: AC
  Filled 2021-07-10: qty 5

## 2021-07-10 MED ORDER — LIDOCAINE 2% (20 MG/ML) 5 ML SYRINGE
INTRAMUSCULAR | Status: DC | PRN
  Administered 2021-07-10: 100 mg via INTRAVENOUS

## 2021-07-10 MED ORDER — FENTANYL CITRATE (PF) 100 MCG/2ML IJ SOLN
25.0000 ug | INTRAMUSCULAR | Status: DC | PRN
  Administered 2021-07-10 (×2): 50 ug via INTRAVENOUS

## 2021-07-10 MED ORDER — PROPOFOL 10 MG/ML IV BOLUS
INTRAVENOUS | Status: AC
  Filled 2021-07-10: qty 20

## 2021-07-10 MED ORDER — 0.9 % SODIUM CHLORIDE (POUR BTL) OPTIME
TOPICAL | Status: DC | PRN
  Administered 2021-07-10: 1000 mL

## 2021-07-10 MED ORDER — ACETAMINOPHEN 325 MG PO TABS: 325.0000 mg | ORAL_TABLET | ORAL | Status: DC | PRN

## 2021-07-10 MED ORDER — ACETAMINOPHEN 160 MG/5ML PO SOLN: 325.0000 mg | ORAL | Status: DC | PRN

## 2021-07-10 MED ORDER — PHENYLEPHRINE HCL-NACL 20-0.9 MG/250ML-% IV SOLN
INTRAVENOUS | Status: DC | PRN
  Administered 2021-07-10: 25 ug/min via INTRAVENOUS

## 2021-07-10 MED ORDER — FENTANYL CITRATE (PF) 100 MCG/2ML IJ SOLN
INTRAMUSCULAR | Status: AC
  Filled 2021-07-10: qty 2

## 2021-07-10 MED ORDER — FENTANYL CITRATE (PF) 100 MCG/2ML IJ SOLN
INTRAMUSCULAR | Status: DC | PRN
  Administered 2021-07-10: 50 ug via INTRAVENOUS
  Administered 2021-07-10: 25 ug via INTRAVENOUS

## 2021-07-10 MED ORDER — ACETAMINOPHEN 10 MG/ML IV SOLN: 1000.0000 mg | Freq: Once | INTRAVENOUS | Status: DC | PRN

## 2021-07-10 MED ORDER — PROPOFOL 10 MG/ML IV BOLUS
INTRAVENOUS | Status: DC | PRN
  Administered 2021-07-10: 150 mg via INTRAVENOUS

## 2021-07-10 MED ORDER — OXYCODONE HCL 5 MG/5ML PO SOLN: 5.0000 mg | Freq: Once | ORAL | Status: DC | PRN

## 2021-07-10 MED ORDER — ONDANSETRON HCL 4 MG/2ML IJ SOLN
INTRAMUSCULAR | Status: DC | PRN
  Administered 2021-07-10: 4 mg via INTRAVENOUS

## 2021-07-10 MED ORDER — OXYCODONE HCL 5 MG PO TABS: 5.0000 mg | ORAL_TABLET | Freq: Once | ORAL | Status: DC | PRN

## 2021-07-10 SURGICAL SUPPLY — 40 items
ADH SKN CLS APL DERMABOND .7 (GAUZE/BANDAGES/DRESSINGS)
BAG COUNTER SPONGE SURGICOUNT (BAG) ×2 IMPLANT
BAG SPNG CNTER NS LX DISP (BAG) ×1
BNDG ELASTIC 4X5.8 VLCR STR LF (GAUZE/BANDAGES/DRESSINGS) ×2 IMPLANT
BNDG ELASTIC 6X5.8 VLCR STR LF (GAUZE/BANDAGES/DRESSINGS) ×2 IMPLANT
BNDG GAUZE ELAST 4 BULKY (GAUZE/BANDAGES/DRESSINGS) IMPLANT
CANISTER SUCT 3000ML PPV (MISCELLANEOUS) ×2 IMPLANT
COVER SURGICAL LIGHT HANDLE (MISCELLANEOUS) ×4 IMPLANT
DERMABOND ADVANCED (GAUZE/BANDAGES/DRESSINGS)
DERMABOND ADVANCED .7 DNX12 (GAUZE/BANDAGES/DRESSINGS) IMPLANT
DRAPE HALF SHEET 70X43 (DRAPES) ×2 IMPLANT
DRAPE ORTHO SPLIT 77X108 STRL (DRAPES) ×4
DRAPE SURG ORHT 6 SPLT 77X108 (DRAPES) ×2 IMPLANT
ELECT REM PT RETURN 9FT ADLT (ELECTROSURGICAL) ×2
ELECTRODE REM PT RTRN 9FT ADLT (ELECTROSURGICAL) ×1 IMPLANT
GAUZE SPONGE 4X4 12PLY STRL (GAUZE/BANDAGES/DRESSINGS) ×2 IMPLANT
GAUZE XEROFORM 5X9 LF (GAUZE/BANDAGES/DRESSINGS) ×2 IMPLANT
GLOVE SRG 8 PF TXTR STRL LF DI (GLOVE) ×2 IMPLANT
GLOVE SURG POLYISO LF SZ8 (GLOVE) IMPLANT
GLOVE SURG UNDER POLY LF SZ8 (GLOVE) ×4
GOWN STRL REUS W/ TWL LRG LVL3 (GOWN DISPOSABLE) ×2 IMPLANT
GOWN STRL REUS W/TWL 2XL LVL3 (GOWN DISPOSABLE) ×2 IMPLANT
GOWN STRL REUS W/TWL LRG LVL3 (GOWN DISPOSABLE) ×4
KIT BASIN OR (CUSTOM PROCEDURE TRAY) ×2 IMPLANT
KIT TURNOVER KIT B (KITS) ×2 IMPLANT
NS IRRIG 1000ML POUR BTL (IV SOLUTION) ×2 IMPLANT
PACK CV ACCESS (CUSTOM PROCEDURE TRAY) IMPLANT
PACK GENERAL/GYN (CUSTOM PROCEDURE TRAY) ×2 IMPLANT
PAD ABD 8X10 STRL (GAUZE/BANDAGES/DRESSINGS) ×2 IMPLANT
PAD ARMBOARD 7.5X6 YLW CONV (MISCELLANEOUS) ×4 IMPLANT
STAPLER VISISTAT 35W (STAPLE) ×2 IMPLANT
SUT ETHILON 2 0 PSLX (SUTURE) ×4 IMPLANT
SUT ETHILON 3 0 PS 1 (SUTURE) IMPLANT
SUT MNCRL AB 4-0 PS2 18 (SUTURE) IMPLANT
SUT VIC AB 2-0 CT1 27 (SUTURE)
SUT VIC AB 2-0 CT1 TAPERPNT 27 (SUTURE) IMPLANT
SUT VIC AB 3-0 SH 27 (SUTURE)
SUT VIC AB 3-0 SH 27X BRD (SUTURE) IMPLANT
TOWEL GREEN STERILE (TOWEL DISPOSABLE) ×2 IMPLANT
WATER STERILE IRR 1000ML POUR (IV SOLUTION) ×2 IMPLANT

## 2021-07-10 NOTE — Anesthesia Postprocedure Evaluation (Signed)
Anesthesia Post Note  Patient: Christopher Pena Dry  Procedure(s) Performed: IRRIGATION AND DEBRIDEMENT LEFT LEG WOUND (Left)     Patient location during evaluation: PACU Anesthesia Type: General Level of consciousness: awake and alert Pain management: pain level controlled Vital Signs Assessment: post-procedure vital signs reviewed and stable Respiratory status: spontaneous breathing, nonlabored ventilation, respiratory function stable and patient connected to nasal cannula oxygen Cardiovascular status: blood pressure returned to baseline and stable Postop Assessment: no apparent nausea or vomiting Anesthetic complications: no   No notable events documented.  Last Vitals:  Vitals:   07/10/21 1011 07/10/21 1130  BP: 110/64 104/62  Pulse: 84 88  Resp: 19 19  Temp: 36.6 C 36.4 C  SpO2: 96% 97%    Last Pain:  Vitals:   07/10/21 1130  TempSrc: Oral  PainSc:                  Shelton Silvas

## 2021-07-10 NOTE — Progress Notes (Signed)
Physical Therapy Treatment Patient Details Name: Christopher Pena MRN: [id number] DOB: 08-13-1946 Today's Date: 07/10/2021   History of Present Illness Pt is a 75 y.o. male admitted on 07/05/21 with an infected bypass graft of the LLE. S/p bypass excision on 11/17. S/p LLE washout and debridement, wound closure 11/22. PMH includes claudication, non healing LE wound, tobacco use, COPD, chronic low back pain; of note, pt has undergone multiple LLE revascularizations and a R femoral to popliteal graft.   PT Comments    Pt progressing with mobility, now s/p LLE wound washout and closure. Today's session focused on LLE therex as pt declining ambulation secondary to pain and fatigue. Pt continues to demonstrate good LLE AROM; HEP handout and activity recommendations provided. Continue to recommend HHPT to maximize functional mobility and independence prior to return home.  Sitting BP 111/53 Standing ~4-min BP 95/87    Recommendations for follow up therapy are one component of a multi-disciplinary discharge planning process, led by the attending physician.  Recommendations may be updated based on patient status, additional functional criteria and insurance authorization.  Follow Up Recommendations  Home health PT     Assistance Recommended at Discharge PRN  Equipment Recommendations  None recommended by PT    Recommendations for Other Services       Precautions / Restrictions Precautions Precautions: Fall;Other (comment) Precaution Comments: monitor O2, soft BP Restrictions Weight Bearing Restrictions: No     Mobility  Bed Mobility Overal bed mobility: Modified Independent                  Transfers Overall transfer level: Needs assistance Equipment used: Rolling walker (2 wheels) Transfers: Sit to/from Stand;Bed to chair/wheelchair/BSC Sit to Stand: Supervision     Step pivot transfers: Supervision     General transfer comment: Supervision for safety/lines to stand  from EOB and take pivotal steps to recliner    Ambulation/Gait               General Gait Details: pt declined secondary to LLE pain and fatigue post-procedure today   Stairs             Wheelchair Mobility    Modified Rankin (Stroke Patients Only)       Balance Overall balance assessment: Needs assistance Sitting-balance support: No upper extremity supported;Feet supported Sitting balance-Leahy Scale: Good       Standing balance-Leahy Scale: Fair Standing balance comment: can static stand wtihout UE support; preference for RW to offload painful LLE                            Cognition Arousal/Alertness: Awake/alert Behavior During Therapy: WFL for tasks assessed/performed Overall Cognitive Status: No family/caregiver present to determine baseline cognitive functioning Area of Impairment: Safety/judgement;Following commands                       Following Commands: Follows one step commands consistently;Follows one step commands with increased time Safety/Judgement: Decreased awareness of safety     General Comments: Pt robust with speech, but able to be redirected; some cues for safety. Pleasant and agreeable        Exercises Other Exercises Other Exercises: Prolonged static standing with L heel to floor, progressing to knee forward for increasing calf stretch; supine heel slides; seated LAQ; ankle pumps in recliner Other Exercises: Med[employer] HEP handout (Access Code [email or web address]) provided    General Comments General comments (skin integrity, edema, etc.):  Seated BP 111/53, standing ~4-min BP 95/87, pt denies dizziness. HR up to 116; SpO2 94% on O2 [place]. Noted increased L foot swelling      Pertinent Vitals/Pain Pain Assessment: Faces Faces Pain Scale: Hurts little more Pain Location: L LE Pain Descriptors / Indicators: Operative site guarding;Sore Pain Intervention(s): Monitored during session;Limited activity within patient's  tolerance    Home Living                          Prior Function            PT Goals (current goals can now be found in the care plan section) Progress towards PT goals: Progressing toward goals    Frequency    Min 3X/week      PT Plan Current plan remains appropriate    Co-evaluation              AM-PAC PT "6 Clicks" Mobility   Outcome Measure  Help needed turning from your back to your side while in a flat bed without using bedrails?: None Help needed moving from lying on your back to sitting on the side of a flat bed without using bedrails?: A Little Help needed moving to and from a bed to a chair (including a wheelchair)?: A Little Help needed standing up from a chair using your arms (e.g., wheelchair or bedside chair)?: A Little Help needed to walk in hospital room?: A Little Help needed climbing 3-5 steps with a railing? : A Little 6 Click Score: 19    End of Session   Activity Tolerance: Patient tolerated treatment well Patient left: in chair;with nursing/sitter in room Nurse Communication: Mobility status PT Visit Diagnosis: Unsteadiness on feet (R26.81);Other abnormalities of gait and mobility (R26.89);Muscle weakness (generalized) (M62.81) Pain - Right/Left: Left Pain - part of body: Leg     Time: 3474-2595 PT Time Calculation (min) (ACUTE ONLY): 18 min  Charges:  $Therapeutic Exercise: 8-22 mins                     Ina Homes, PT, DPT Acute Rehabilitation Services  Pager [phone] Office [phone]  Malachy Chamber 07/10/2021, 3:46 PM

## 2021-07-10 NOTE — Op Note (Signed)
    NAME: AULTON ROUTT    MRN: [id number] DOB: 11/19/1945    DATE OF OPERATION: 07/10/2021  PREOP DIAGNOSIS:    Open left leg wound  POSTOP DIAGNOSIS:    Same  PROCEDURE:    Left leg washout, debridement, closure  SURGEON: Victorino Sparrow  ASSIST: Nathanial Rancher, PA  ANESTHESIA: General  EBL: 77ml  INDICATIONS:    LADARIEN BEEKS is a 75 y.o. male with multiple of lower extremity revascularizations, most recently infected bypass, excision, left anterior tibial artery vein patch.  The distal incision was left open due to tension, with plans to return to the OR for closure.  Patient is doing well postoperatively, and it appears his infection is resolving.  After discussing the risks and benefits of return to the OR for left leg washout, debridement, closure, Kerem elected to proceed  FINDINGS:   Healthy-appearing muscle in the anterior compartment of the left leg  TECHNIQUE:   Patient was brought to the OR and laid in supine position.  General anesthesia was induced and the patient was prepped and draped in standard fashion.  Case began with removal of the Vesseloops and staples overlying the wound in Roman sandal fashion.  The wound was assessed, irrigated, nonviable soft tissue debrided. The wound size was 15 x 5 x 4cm   I undermined the skin for closure.  The wound was once again irrigated with saline and closed using 2-0 nylon suture, and staples at the skin.  Given the complexity of the case a first assistant was necessary in order to expedient the procedure and safely perform the technical aspects of the operation.  Ladonna Snide, MD [facility]  DATE OF DICTATION:   07/10/2021

## 2021-07-10 NOTE — Progress Notes (Signed)
[facility] [place]   Subjective  - pain improved, feels good this morning NPO for washout   Objective 125/68 89 98.3 F (36.8 C) (Oral) 20 95%  Intake/Output Summary (Last 24 hours) at 07/10/2021 0719 Last data filed at 07/10/2021 0217 Gross per 24 hour  Intake 1077.03 ml  Output 1075 ml  Net 2.03 ml     Left LE dressing clean and dry Peroneal signal via doppler intact Left LE, DP signal present, venous PT signal Motor intact, toes/SLR Lungs non labored breathing O2 24 hours at home Heart Tachy 118-132, BP stable 121/55   Assessment/Planning: Day of Surgery Left lower extremity bypass excision Left greater saphenous vein harvest Left anterior tibial artery vein patch  Inflow peroneal intact peroneal, DP, PT signals Motor intact, denies,chills, afebrile  Leukocytosis improved.  Daily CBC, BMP. Continue empiric IV antibiotics.  Culture without growth to date.   OR today After discussing the risks and benefits of L leg washout and debridement, wound closure, pt elected to proceed.    Victorino Sparrow 07/10/2021 7:19 AM --  Laboratory Lab Results: Recent Labs    07/09/21 0341 07/10/21 0118  WBC 12.4* 13.8*  HGB 7.8* 8.0*  HCT 25.3* 26.4*  PLT 387 425*    BMET Recent Labs    07/08/21 1039 07/10/21 0118  NA 135 136  K 4.1 4.2  CL 99 99  CO2 29 30  GLUCOSE 119* 118*  BUN 12 12  CREATININE 0.98 0.82  CALCIUM 7.9* 8.1*     COAG Lab Results  Component Value Date   INR 1.2 07/05/2021   INR 1.1 12/07/2020   INR 1.0 12/06/2020   No results found for: PTT

## 2021-07-10 NOTE — Anesthesia Procedure Notes (Signed)
Procedure Name: LMA Insertion Date/Time: 07/10/2021 7:50 AM Performed by: Lynnell Chad, CRNA Pre-anesthesia Checklist: Patient identified, Emergency Drugs available, Suction available and Patient being monitored Patient Re-evaluated:Patient Re-evaluated prior to induction Oxygen Delivery Method: Circle System Utilized Preoxygenation: Pre-oxygenation with 100% oxygen Induction Type: IV induction Ventilation: Mask ventilation without difficulty LMA: LMA inserted LMA Size: 4.0 Number of attempts: 1 Airway Equipment and Method: Bite block Placement Confirmation: positive ETCO2 Tube secured with: Tape Dental Injury: Teeth and Oropharynx as per pre-operative assessment

## 2021-07-10 NOTE — Transfer of Care (Signed)
Immediate Anesthesia Transfer of Care Note  Patient: Christopher Pena  Procedure(s) Performed: IRRIGATION AND DEBRIDEMENT LEFT LEG WOUND (Left)  Patient Location: PACU  Anesthesia Type:General  Level of Consciousness: awake, drowsy and patient cooperative  Airway & Oxygen Therapy: Patient Spontanous Breathing and Patient connected to nasal cannula oxygen  Post-op Assessment: Report given to RN and Post -op Vital signs reviewed and stable  Post vital signs: Reviewed and stable  Last Vitals:  Vitals Value Taken Time  BP 152/141 07/10/21 0845  Temp    Pulse 83 07/10/21 0847  Resp 21 07/10/21 0847  SpO2 93 % 07/10/21 0847  Vitals shown include unvalidated device data.  Last Pain:  Vitals:   07/10/21 0508  TempSrc: Oral  PainSc:       Patients Stated Pain Goal: 3 (07/07/21 0433)  Complications: No notable events documented.

## 2021-07-10 NOTE — Anesthesia Preprocedure Evaluation (Addendum)
Anesthesia Evaluation  Patient identified by MRN, date of birth, ID band Patient awake    Reviewed: Allergy & Precautions, NPO status , Patient's Chart, lab work & pertinent test results  Airway Mallampati: I       Dental  (+) Edentulous Upper, Edentulous Lower   Pulmonary COPD, former smoker,    Pulmonary exam normal        Cardiovascular hypertension, + Peripheral Vascular Disease and + DOE   Rhythm:Regular Rate:Normal     Neuro/Psych Anxiety  Neuromuscular disease    GI/Hepatic negative GI ROS, Neg liver ROS,   Endo/Other  negative endocrine ROS  Renal/GU negative Renal ROS     Musculoskeletal negative musculoskeletal ROS (+)   Abdominal Normal abdominal exam  (+)   Peds  Hematology negative hematology ROS (+)   Anesthesia Other Findings   Reproductive/Obstetrics                            Anesthesia Physical Anesthesia Plan  ASA: 3  Anesthesia Plan: General   Post-op Pain Management:    Induction: Intravenous  PONV Risk Score and Plan: 3 and Ondansetron, Dexamethasone and Midazolam  Airway Management Planned: LMA  Additional Equipment: None  Intra-op Plan:   Post-operative Plan: Extubation in OR  Informed Consent: I have reviewed the patients History and Physical, chart, labs and discussed the procedure including the risks, benefits and alternatives for the proposed anesthesia with the patient or authorized representative who has indicated his/her understanding and acceptance.     Dental advisory given  Plan Discussed with: CRNA  Anesthesia Plan Comments:       Anesthesia Quick Evaluation

## 2021-07-10 NOTE — Progress Notes (Signed)
Mobility Specialist: Progress Note   07/10/21 1820  Mobility  Activity Transferred:  Chair to bed  Level of Assistance Contact guard assist, steadying assist  Assistive Device None  Distance Ambulated (ft) 4 ft  Mobility Out of bed to chair with meals  Mobility Response Tolerated well  Mobility performed by Mobility specialist  $Mobility charge 1 Mobility   Post-Mobility: 103 HR, 93% SpO2  Pt independent to stand and contact guard during ambulation. Pt back to bed per request with call bell at his side and family member present in the room.   [facility] Emeree Mahler Mobility Specialist Mobility Specialist Phone #1: [phone] Mobility Specialist Phone #2: [phone]

## 2021-07-10 NOTE — Progress Notes (Addendum)
ANTICOAGULATION CONSULT NOTE - Initial Consult  Pharmacy Consult for heparin Indication: severe LLE PAD, hx thrombectomy   Allergies  Allergen Reactions   Clindamycin/Lincomycin Diarrhea    Patient Measurements: Height: 5\' 7"  (170.2 cm) Weight: 72.6 kg (160 lb) (stated per pt) IBW/kg (Calculated) : 66.1 Heparin Dosing Weight: 61 kg  Vital Signs: Temp: 97.8 F (36.6 C) (11/22 1011) Temp Source: Oral (11/22 1011) BP: 110/64 (11/22 1011) Pulse Rate: 84 (11/22 1011)  Labs: Recent Labs    07/08/21 0106 07/08/21 1039 07/08/21 1039 07/09/21 0341 07/10/21 0118  HGB  --  7.0*   < > 7.8* 8.0*  HCT  --  22.9*  --  25.3* 26.4*  PLT  --  440*  --  387 425*  HEPARINUNFRC  --   --   --  0.40 0.25*  CREATININE 0.91 0.98  --   --  0.82   < > = values in this interval not displayed.    Estimated Creatinine Clearance: 72.8 mL/min (by C-G formula based on SCr of 0.82 mg/dL).   Medical History: Past Medical History:  Diagnosis Date   Anxiety    Chronic low back pain    "degenerative spine dx'd ~ 7 yr ago" (06/08/2013)   Claudication [facility])    Constipation due to pain medication    COPD (chronic obstructive pulmonary disease) (HCC)    Hypertension    "moderately high; RX didn't help" (06/08/2013) - no longer on meds (as of 09/19/16)   Neuromuscular disorder (HCC)    degen spine   Non-healing wound of lower extremity 06/09/2013   PAD (peripheral artery disease), PTA/stent IDEV & chocolate baloon 06/08/13 04/13/2013   Severely reduced ABI's of 0.3 bilaterally    Tobacco use 06/09/2013   Unexplained weight loss     Medications:  Medications Prior to Admission  Medication Sig Dispense Refill Last Dose   acetaminophen (TYLENOL) 500 MG tablet Take 500 mg by mouth every 6 (six) hours as needed for headache.   unk   albuterol (VENTOLIN HFA) 108 (90 Base) MCG/ACT inhaler Inhale 2 puffs into the lungs every 6 (six) hours as needed for wheezing or shortness of breath. 18 g 3 07/05/2021    aspirin EC 81 MG EC tablet Take 1 tablet (81 mg total) by mouth daily at 6 (six) AM. Swallow whole.   07/05/2021   cilostazol (PLETAL) 100 MG tablet TAKE ONE TABLET BY MOUTH TWICE DAILY BEFORE meals (Patient taking differently: Take 100 mg by mouth 2 (two) times daily.) 180 tablet 1 07/04/2021 at 2100   clopidogrel (PLAVIX) 75 MG tablet Take 75 mg by mouth daily.   07/05/2021 at 0700   ipratropium-albuterol (DUONEB) 0.5-2.5 (3) MG/3ML SOLN TAKE THREE MLS VIA NEBULIZER EVERY 6 HOURS AS NEEDED (Patient taking differently: Inhale 3 mLs into the lungs every 6 (six) hours as needed (shortness of breath).) 360 mL 0 unk   naloxone (NARCAN) nasal spray 4 mg/0.1 mL SMARTSIG:In Nostril   unk   oxyCODONE (ROXICODONE) 15 MG immediate release tablet Take 15 mg by mouth every 4 (four) hours as needed for pain.   07/05/2021   rosuvastatin (CRESTOR) 20 MG tablet Take 1 tablet (20 mg total) by mouth daily. 30 tablet 11 07/05/2021   sertraline (ZOLOFT) 25 MG tablet Take 1 tablet (25 mg total) by mouth daily. 90 tablet 2 07/05/2021   Scheduled:   aspirin EC  81 mg Oral Q0600   cilostazol  100 mg Oral QHS   docusate sodium  100  mg Oral Daily   fentaNYL       insulin aspart  0-9 Units Subcutaneous TID WC   mouth rinse  15 mL Mouth Rinse BID   oxyCODONE  15 mg Oral Q4H   pantoprazole  40 mg Oral Daily   potassium chloride  20-40 mEq Oral Once   rosuvastatin  20 mg Oral Daily   sertraline  25 mg Oral Daily   Infusions:   sodium chloride     heparin 950 Units/hr (07/10/21 0846)   lactated ringers 0 mL/hr at 07/09/21 1559   magnesium sulfate bolus IVPB     piperacillin-tazobactam (ZOSYN)  IV 3.375 g (07/10/21 0217)   vancomycin 1,250 mg (07/09/21 1815)    Assessment: 75 yo M with history of RLE bypass c/b occlusion and thrombectomy as well as hx LLE femoral thrombectomy with left iliofemoral endarterectomy with bovine pericardial patch angioplasty by Dr. Myra Gianotti on 12/08/2020 with chronically occluded  bypass graft. Presented to clinic 11/17 with infected bypass hanging out of his leg. Planning for left lower extremity bypass excision, left leg revascularization and is at high risk for decreased perfusion and ischemia. Additionally, 11/20 CT shows infrarenal abdominal aortic aneurysm with a large burden of mural thrombus. No anticoagulation prior to admission. Pharmacy consulted for heparin.    Heparin level down to 0.25 is subtherapeutic on 950 units/hr.  H/H 8 stable after 1 unit RBC 11/20, plt stable. S/p OR 11/22 wound debridement and washout of left tibial incision. No issues with infusion or bleeding per RN. Will target higher end of goal given severe PAD.   Goal of Therapy:  Heparin level 0.3-0.7 units/ml Monitor platelets by anticoagulation protocol: Yes   Plan:  Increase heparin 1050 units/hour F/u 8hr HL Monitor daily HL, CBC/plt Monitor for signs/symptoms of bleeding    Thank you for allowing pharmacy to participate in this patient's care.  Alphia Moh, PharmD, BCPS, BCCP Clinical Pharmacist  Please check AMION for all [facility] Pharmacy phone numbers After 10:00 PM, call Main Pharmacy [phone]

## 2021-07-10 NOTE — Progress Notes (Signed)
Patient returned from OR. He is alert and orineted x4. Pulses in left lower extremity ( dorsalis pedis, posterior tibial and peroneal) dopplerable. Foot has +2 pitting edema. It is warm to touch. VS WNL. Currently resting in bed in no acute distress. Brynda Rim, RN

## 2021-07-10 NOTE — Progress Notes (Signed)
   07/10/21 0850  Clinical Encounter Type  Visited With Patient not available  Visit Type Follow-up  Referral From Nurse  Consult/Referral To Chaplain   Chaplain Tery Sanfilippo responded to the follow up request for an Advance Directive. Nurse in the Short Stay dept said patient was in surgery. Chaplain will follow up. This note was prepared by Deneen Harts, M.Div..  For questions please contact by phone [phone].

## 2021-07-11 ENCOUNTER — Encounter (HOSPITAL_COMMUNITY): Payer: Self-pay | Admitting: Vascular Surgery

## 2021-07-11 LAB — GLUCOSE, CAPILLARY
Glucose-Capillary: 126 mg/dL — ABNORMAL HIGH (ref 70–99)
Glucose-Capillary: 119 mg/dL — ABNORMAL HIGH (ref 70–99)
Glucose-Capillary: 105 mg/dL — ABNORMAL HIGH (ref 70–99)
Glucose-Capillary: 120 mg/dL — ABNORMAL HIGH (ref 70–99)

## 2021-07-11 LAB — CBC
HCT: 26.3 % — ABNORMAL LOW (ref 39.0–52.0)
Hemoglobin: 8 g/dL — ABNORMAL LOW (ref 13.0–17.0)
MCH: 26.3 pg (ref 26.0–34.0)
MCHC: 30.4 g/dL (ref 30.0–36.0)
MCV: 86.5 fL (ref 80.0–100.0)
Platelets: 444 10*3/uL — ABNORMAL HIGH (ref 150–400)
RBC: 3.04 MIL/uL — ABNORMAL LOW (ref 4.22–5.81)
RDW: 15.7 % — ABNORMAL HIGH (ref 11.5–15.5)
WBC: 13.2 10*3/uL — ABNORMAL HIGH (ref 4.0–10.5)
nRBC: 0 % (ref 0.0–0.2)

## 2021-07-11 LAB — BASIC METABOLIC PANEL
Anion gap: 8 (ref 5–15)
BUN: 13 mg/dL (ref 8–23)
CO2: 29 mmol/L (ref 22–32)
Calcium: 8.2 mg/dL — ABNORMAL LOW (ref 8.9–10.3)
Chloride: 100 mmol/L (ref 98–111)
Creatinine, Ser: 0.81 mg/dL (ref 0.61–1.24)
GFR, Estimated: >60 mL/min (ref >60)
Glucose, Bld: 116 mg/dL — ABNORMAL HIGH (ref 70–99)
Potassium: 4.9 mmol/L (ref 3.5–5.1)
Sodium: 137 mmol/L (ref 135–145)

## 2021-07-11 LAB — HEPARIN LEVEL (UNFRACTIONATED): Heparin Unfractionated: 0.19 IU/mL — ABNORMAL LOW (ref 0.30–0.70)

## 2021-07-11 MED ORDER — CLOPIDOGREL BISULFATE 75 MG PO TABS
75.0000 mg | ORAL_TABLET | Freq: Every day | ORAL | Status: DC
  Administered 2021-07-11 – 2021-07-12 (×2): 75 mg via ORAL
  Filled 2021-07-11 (×2): qty 1

## 2021-07-11 NOTE — Discharge Summary (Signed)
[facility] Discharge Summary   Patient ID:  Christopher Pena MRN: [id number] DOB/AGE: February 19, 1946 75 y.o.  Admit date: 07/05/2021 Discharge date: 07/12/21 Date of Surgery: 07/10/2021 Surgeon: Surgeon(s): Broadus John, MD  Admission Diagnosis: Superficial foreign body of left leg without major open wound and without infection [S80.852A]  Discharge Diagnoses:  Superficial foreign body of left leg without major open wound and without infection [S80.852A]  Secondary Diagnoses: Past Medical History:  Diagnosis Date   Anxiety    Chronic low back pain    "degenerative spine dx'd ~ 7 yr ago" (06/08/2013)   Claudication [facility])    Constipation due to pain medication    COPD (chronic obstructive pulmonary disease) ([place])    Hypertension    "moderately high; RX didn't help" (06/08/2013) - no longer on meds (as of 09/19/16)   Neuromuscular disorder ([place])    degen spine   Non-healing wound of lower extremity 06/09/2013   PAD (peripheral artery disease), PTA/stent IDEV & chocolate baloon 06/08/13 04/13/2013   Severely reduced ABI's of 0.3 bilaterally    Tobacco use 06/09/2013   Unexplained weight loss     Procedure(s): IRRIGATION AND DEBRIDEMENT LEFT LEG WOUND  Discharged Condition: stable  HPI: Patient presented to the office with left LE bypass graft on the exterior of his left leg with signs of infection.  He was ambulatory and there were no open wound on his left foot.  He was sent to [place] for urgent surgery.  Patient is a 75 y.o. year old male who presents for evaluation of PAD.  He is s/p  left femoral thrombectomy with left iliofemoral endarterectomy with bovine pericardial patch angioplasty by Dr. Trula Slade on 12/08/2020. He had previously undergone a vein femoral tibial bypass and a Gore-Tex femoral-tibial bypass.  His bypass graft is chronic occluded.  He developed rest pain in his left leg and a CT scan showed common femoral occlusion.  Hospital  Course:  Christopher Pena is a 75 y.o. male is S/P Left Procedure(s): IRRIGATION AND DEBRIDEMENT LEFT LEG WOUND Left lower extremity bypass excision Left greater saphenous vein harvest Left anterior tibial artery vein patch He was placed on IV antibiotics, ambulating with PT daily, pain well controlled with PO's.    On 07/09/21 he spiked a temp of 102, tachycardia, and had a drop in O2 SATs requiring 5L O2 [place].  His baseline is 2L O2 [place].  CTA chest was negative for PE He was placed on Heparin empirically, cont. IV antibiotics, and weaned off O2 slowly.    Cultures grew  Specimen Description WOUND LEFT GRAFT   Special Requests PT ON VANCOMYCIN   Gram Stain ABUNDANT WBC PRESENT,BOTH PMN AND MONONUCLEAR  NO ORGANISMS SEEN   Culture RARE Shaw will be to discharge on Augmentin for an additional 3 days PO.  He has received 1 full week of IV antibiotics.  BP stable, HR WNL, O2 SAT 91 at rest on 2L [place].  Plan for discharge with [facility] RN for wound checks.           Significant Diagnostic Studies: CBC Lab Results  Component Value Date   WBC 13.2 (H) 07/11/2021   HGB 8.0 (L) 07/11/2021   HCT 26.3 (L) 07/11/2021   MCV 86.5 07/11/2021   PLT 444 (H) 07/11/2021    BMET    Component Value Date/Time   NA 137 07/11/2021 0046   K 4.9 07/11/2021 0046   CL 100 07/11/2021 0046  CO2 29 07/11/2021 0046   GLUCOSE 116 (H) 07/11/2021 0046   BUN 13 07/11/2021 0046   CREATININE 0.81 07/11/2021 0046   CREATININE 0.69 05/11/2014 1533   CALCIUM 8.2 (L) 07/11/2021 0046   GFRNONAA >60 07/11/2021 0046   GFRNONAA >89 04/19/2014 1418   GFRAA >60 12/24/2018 0224   GFRAA >89 04/19/2014 1418   COAG Lab Results  Component Value Date   INR 1.2 07/05/2021   INR 1.1 12/07/2020   INR 1.0 12/06/2020     Disposition:  Discharge to :Home  Allergies as of 07/12/2021       Reactions   Clindamycin/lincomycin Diarrhea        Medication List     STOP taking these medications     cilostazol 100 MG tablet Commonly known as: PLETAL       TAKE these medications    acetaminophen 500 MG tablet Commonly known as: TYLENOL Take 500 mg by mouth every 6 (six) hours as needed for headache.   albuterol 108 (90 Base) MCG/ACT inhaler Commonly known as: VENTOLIN HFA Inhale 2 puffs into the lungs every 6 (six) hours as needed for wheezing or shortness of breath.   amoxicillin-clavulanate 875-125 MG tablet Commonly known as: Augmentin Take 1 tablet by mouth 2 (two) times daily for 10 days.   aspirin 81 MG EC tablet Take 1 tablet (81 mg total) by mouth daily at 6 (six) AM. Swallow whole.   clopidogrel 75 MG tablet Commonly known as: PLAVIX Take 75 mg by mouth daily.   ipratropium-albuterol 0.5-2.5 (3) MG/3ML Soln Commonly known as: DUONEB TAKE THREE MLS VIA NEBULIZER EVERY 6 HOURS AS NEEDED What changed: See the new instructions.   naloxone 4 MG/0.1ML Liqd nasal spray kit Commonly known as: NARCAN SMARTSIG:In Nostril   oxyCODONE 15 MG immediate release tablet Commonly known as: ROXICODONE Take 1 tablet (15 mg total) by mouth every 6 (six) hours as needed for pain. What changed: when to take this   rosuvastatin 20 MG tablet Commonly known as: CRESTOR Take 1 tablet (20 mg total) by mouth daily.   sertraline 25 MG tablet Commonly known as: ZOLOFT Take 1 tablet (25 mg total) by mouth daily.       Verbal and written Discharge instructions given to the patient. Wound care per Discharge AVS  Follow-up Information     [facility] Follow up.   Specialty: Home Health Services Why: go by [facility] now- HHRN/PT arranged- they will contact you to schedule initial visit- due to holiday initial visit may not be until 11/29- will try to schedule sooner if able. Contact information: [place] [address] [phone]         [facility] Follow up in 2 week(s).   Specialty: Vascular Surgery Contact  information: [address] [place] [phone]                Signed: Roxy Horseman 07/18/2021, 10:04 AM

## 2021-07-11 NOTE — Progress Notes (Addendum)
[facility] [place]  Subjective  - doing OK over all   Objective 131/69 69 98 F (36.7 C) (Oral) 20 91%  Intake/Output Summary (Last 24 hours) at 07/11/2021 0739 Last data filed at 07/11/2021 0076 Gross per 24 hour  Intake 1461.16 ml  Output 2770 ml  Net -1308.84 ml    Left LE dressing clean and dry Motor and sensation intact left LE with doppler peroneal and AT Heart RRR Lungs non labored at rest on 2 L [place] O2  Assessment/Planning: POD# 1 Irrigation and debridement primary wound closure later left distal incision Left lower extremity bypass excision Left greater saphenous vein harvest Left anterior tibial artery vein patch  Good inflow Dressing will be changed We will d/c heparin asa and Plavix O2 support at baseline   Plan for d/c home tomorrow    Mosetta Pigeon 07/11/2021 7:39 AM --  VASCULAR STAFF ADDENDUM: I have independently interviewed and examined the patient. I agree with the above.  Home tomorrow pending PT clearance and home nursing set up  DAPT, heparin d/c'd  Fara Olden, MD [facility] Phone Number: [phone] 07/11/2021 12:18 PM    Laboratory Lab Results: Recent Labs    07/10/21 0118 07/11/21 0046  WBC 13.8* 13.2*  HGB 8.0* 8.0*  HCT 26.4* 26.3*  PLT 425* 444*   BMET Recent Labs    07/10/21 0118 07/11/21 0046  NA 136 137  K 4.2 4.9  CL 99 100  CO2 30 29  GLUCOSE 118* 116*  BUN 12 13  CREATININE 0.82 0.81  CALCIUM 8.1* 8.2*    COAG Lab Results  Component Value Date   INR 1.2 07/05/2021   INR 1.1 12/07/2020   INR 1.0 12/06/2020   No results found for: PTT

## 2021-07-11 NOTE — Progress Notes (Addendum)
ANTICOAGULATION CONSULT NOTE - Initial Consult  Pharmacy Consult for heparin Indication: severe LLE PAD, hx thrombectomy   Allergies  Allergen Reactions   Clindamycin/Lincomycin Diarrhea    Patient Measurements: Height: 5\' 7"  (170.2 cm) Weight: 72.6 kg (160 lb) (stated per pt) IBW/kg (Calculated) : 66.1 Heparin Dosing Weight: 61 kg  Vital Signs: Temp: 98 F (36.7 C) (11/23 0400) Temp Source: Oral (11/23 0400) BP: 131/69 (11/23 0400) Pulse Rate: 69 (11/23 0400)  Labs: Recent Labs    07/08/21 1039 07/08/21 1039 07/09/21 0341 07/10/21 0118 07/10/21 1750 07/11/21 0041 07/11/21 0046  HGB 7.0*  --  7.8* 8.0*  --   --  8.0*  HCT 22.9*  --  25.3* 26.4*  --   --  26.3*  PLT 440*  --  387 425*  --   --  444*  HEPARINUNFRC  --    < > 0.40 0.25* 0.12* 0.19*  --   CREATININE 0.98  --   --  0.82  --   --  0.81   < > = values in this interval not displayed.    Estimated Creatinine Clearance: 73.7 mL/min (by C-G formula based on SCr of 0.81 mg/dL).   Medical History: Past Medical History:  Diagnosis Date   Anxiety    Chronic low back pain    "degenerative spine dx'd ~ 7 yr ago" (06/08/2013)   Claudication [facility])    Constipation due to pain medication    COPD (chronic obstructive pulmonary disease) (HCC)    Hypertension    "moderately high; RX didn't help" (06/08/2013) - no longer on meds (as of 09/19/16)   Neuromuscular disorder (HCC)    degen spine   Non-healing wound of lower extremity 06/09/2013   PAD (peripheral artery disease), PTA/stent IDEV & chocolate baloon 06/08/13 04/13/2013   Severely reduced ABI's of 0.3 bilaterally    Tobacco use 06/09/2013   Unexplained weight loss     Medications:  Medications Prior to Admission  Medication Sig Dispense Refill Last Dose   acetaminophen (TYLENOL) 500 MG tablet Take 500 mg by mouth every 6 (six) hours as needed for headache.   unk   albuterol (VENTOLIN HFA) 108 (90 Base) MCG/ACT inhaler Inhale 2 puffs into the lungs every  6 (six) hours as needed for wheezing or shortness of breath. 18 g 3 07/05/2021   aspirin EC 81 MG EC tablet Take 1 tablet (81 mg total) by mouth daily at 6 (six) AM. Swallow whole.   07/05/2021   cilostazol (PLETAL) 100 MG tablet TAKE ONE TABLET BY MOUTH TWICE DAILY BEFORE meals (Patient taking differently: Take 100 mg by mouth 2 (two) times daily.) 180 tablet 1 07/04/2021 at 2100   clopidogrel (PLAVIX) 75 MG tablet Take 75 mg by mouth daily.   07/05/2021 at 0700   ipratropium-albuterol (DUONEB) 0.5-2.5 (3) MG/3ML SOLN TAKE THREE MLS VIA NEBULIZER EVERY 6 HOURS AS NEEDED (Patient taking differently: Inhale 3 mLs into the lungs every 6 (six) hours as needed (shortness of breath).) 360 mL 0 unk   naloxone (NARCAN) nasal spray 4 mg/0.1 mL SMARTSIG:In Nostril   unk   oxyCODONE (ROXICODONE) 15 MG immediate release tablet Take 15 mg by mouth every 4 (four) hours as needed for pain.   07/05/2021   rosuvastatin (CRESTOR) 20 MG tablet Take 1 tablet (20 mg total) by mouth daily. 30 tablet 11 07/05/2021   sertraline (ZOLOFT) 25 MG tablet Take 1 tablet (25 mg total) by mouth daily. 90 tablet 2 07/05/2021  Scheduled:   aspirin EC  81 mg Oral Q0600   cilostazol  100 mg Oral QHS   docusate sodium  100 mg Oral Daily   insulin aspart  0-9 Units Subcutaneous TID WC   mouth rinse  15 mL Mouth Rinse BID   oxyCODONE  15 mg Oral Q4H   pantoprazole  40 mg Oral Daily   potassium chloride  20-40 mEq Oral Once   rosuvastatin  20 mg Oral Daily   sertraline  25 mg Oral Daily   Infusions:   sodium chloride     heparin 1,050 Units/hr (07/10/21 2127)   lactated ringers 0 mL/hr at 07/09/21 1559   magnesium sulfate bolus IVPB     piperacillin-tazobactam (ZOSYN)  IV 3.375 g (07/11/21 0205)   vancomycin 166.7 mL/hr at 07/11/21 0351    Assessment: 75 yo M with history of RLE bypass c/b occlusion and thrombectomy as well as hx LLE femoral thrombectomy with left iliofemoral endarterectomy with bovine pericardial patch  angioplasty by Dr. Myra Gianotti on 12/08/2020 with chronically occluded bypass graft. Presented to clinic 11/17 with infected bypass hanging out of his leg. Planning for left lower extremity bypass excision, left leg revascularization and is at high risk for decreased perfusion and ischemia. Additionally, 11/20 CT shows infrarenal abdominal aortic aneurysm with a large burden of mural thrombus. No anticoagulation prior to admission. Pharmacy consulted for heparin.    Heparin level down to 0.19 is subtherapeutic despite increase to 1050 units/hr.  H/H 8 stable, plt stable.  Confirmed no issues with infusion or bleeding per RN. Will target higher end of goal given severe PAD.   Goal of Therapy:  Heparin level 0.3-0.7 units/ml Monitor platelets by anticoagulation protocol: Yes   Plan:  Increase heparin 1250 units/hour F/u 8hr HL Monitor daily HL, CBC/plt Monitor for signs/symptoms of bleeding    ADDENDUM 8:10: Heparin DC'd by team > all orders discontinued  Thank you for allowing pharmacy to participate in this patient's care.  Alphia Moh, PharmD, BCPS, BCCP Clinical Pharmacist  Please check AMION for all [facility] Pharmacy phone numbers After 10:00 PM, call Main Pharmacy [phone]

## 2021-07-11 NOTE — TOC Initial Note (Signed)
Transition of Care (TOC) - Initial/Assessment Note  Donn Pierini RN, BSN Transitions of Care Unit 4E- RN Case Manager See Treatment Team for direct phone #    Patient Details  Name: Christopher Pena MRN: [id number] Date of Birth: [birth year]  Transition of Care [facility]) CM/SW Contact:    Darrold Span, RN Phone Number: 07/11/2021, 12:15 PM  Clinical Narrative:                 Noted orders for HHRN/PT- pt s/p infected graft removal and I&D. From home alone.  CM in to speak with pt at bedside- discussed transition plans and needs. Per pt he plans to return to his apartment, reports that his daughters and grandkids will be able to assist him as needed on return home.  Pt has both RW and rollator at home. No further DME needs noted at this time. Discussed HH orders for PT and RN. Patient agreeable to services and reports he has used HH in the past- list provided for choice Per CMS guidelines from medicare.gov website with star ratings (copy placed in shadow chart) - per pt he thinks he has used [facility] in past, also discussed other options including option with [facility] as vascular as a working relationship with them. Pt voiced that he does not really have a preference and is open to using any agency that will provide services that he needs and work with his insurance. Have spoken with Misty Stanley at [facility] and they can accept referral under vascular however due to holiday there is a slight delay in [facility]- until 11/29- this was discussed with pt who is agreeable to the delay and voiced understanding. - referral has been accept by [facility] for [facility] RN/PT-   Address, phone # and PCP all confirmed with pt in epic, pt reports family to transport home.   Expected Discharge Plan: Home w Home Health Services Barriers to Discharge: Continued Medical Work up   Patient Goals and CMS Choice Patient states their goals for this hospitalization and ongoing recovery are:: return home when MD says i'm ready CMS  Medicare.gov Compare Post Acute Care list provided to:: Patient Choice offered to / list presented to : Patient  Expected Discharge Plan and Services Expected Discharge Plan: Home w Home Health Services   Discharge Planning Services: CM Consult Post Acute Care Choice: Home Health Living arrangements for the past 2 months: Apartment                 DME Arranged: N/A DME Agency: NA       HH Arranged: RN, PT HH Agency: [facility] Date HH Agency Contacted: 07/11/21 Time HH Agency Contacted: 1214 Representative spoke with at [facility] Agency: Misty Stanley  Prior Living Arrangements/Services Living arrangements for the past 2 months: Apartment Lives with:: Self Patient language and need for interpreter reviewed:: Yes Do you feel safe going back to the place where you live?: Yes      Need for Family Participation in Patient Care: Yes (Comment) Care giver support system in place?: Yes (comment) Current home services: DME (RW, rollator) Criminal Activity/Legal Involvement Pertinent to Current Situation/Hospitalization: No - Comment as needed  Activities of Daily Living Home Assistive Devices/Equipment: Environmental consultant (specify type), Oxygen, Nebulizer, Shower chair with back, Hearing aid, Raised toilet seat with rails, Grab bars in shower, Grab bars around toilet, Eyeglasses, Dentures (specify type), Blood pressure cuff ADL Screening (condition at time of admission) Patient's cognitive ability adequate to safely complete daily activities?: Yes Is the  patient deaf or have difficulty hearing?: Yes Does the patient have difficulty seeing, even when wearing glasses/contacts?: No Does the patient have difficulty concentrating, remembering, or making decisions?: Yes Patient able to express need for assistance with ADLs?: Yes Does the patient have difficulty dressing or bathing?: Yes Independently performs ADLs?: No Communication: Independent Dressing (OT): Needs assistance Is this a change from  baseline?: Pre-admission baseline Grooming: Needs assistance Is this a change from baseline?: Pre-admission baseline Feeding: Independent Bathing: Needs assistance Is this a change from baseline?: Pre-admission baseline Toileting: Needs assistance Is this a change from baseline?: Pre-admission baseline In/Out Bed: Needs assistance Is this a change from baseline?: Pre-admission baseline Walks in Home: Independent (minimal walking - short) Does the patient have difficulty walking or climbing stairs?: Yes (sob) Weakness of Legs: None Weakness of Arms/Hands: None  Permission Sought/Granted Permission sought to share information with : Facility Industrial/product designer granted to share information with : Yes, Verbal Permission Granted     Permission granted to share info w AGENCY: HH        Emotional Assessment Appearance:: Appears stated age Attitude/Demeanor/Rapport: Engaged Affect (typically observed): Accepting, Appropriate, Pleasant Orientation: : Oriented to Self, Oriented to Place, Oriented to  Time, Oriented to Situation Alcohol / Substance Use: Not Applicable Psych Involvement: No (comment)  Admission diagnosis:  Superficial foreign body of left leg without major open wound and without infection [S80.852A] Patient Active Problem List   Diagnosis Date Noted   Superficial foreign body of left leg without major open wound and without infection 07/05/2021   Atherosclerosis of artery of extremity with rest pain (HCC) 12/07/2020   Atherosclerosis of left lower extremity with rest pain (HCC) 12/06/2020   DOE (dyspnea on exertion) 09/06/2019   Chronic respiratory failure with hypoxia (HCC) 02/05/2019   Acute on chronic respiratory failure with hypoxia (HCC) 12/17/2018   Hyperlipidemia 02/12/2017   Physical exam 08/14/2016   Femoral-popliteal bypass graft occlusion, left (HCC) 05/21/2016   Allergic rhinitis 05/13/2016   Protein-calorie malnutrition, severe (HCC)  04/08/2016   Pain in joint, lower leg 10/17/2014   Encounter for post surgical wound check 12/09/2013   Peripheral vascular disease, unspecified (HCC) 11/08/2013   Leg edema, left, foot 06/22/2013   Cellulitis of foot, left 06/09/2013   Non-healing wound of lower extremity, lt great toe 06/09/2013   Claudication, lifestyle limiting 05/11/2013   PAD (peripheral artery disease), PTA/stent IDEV & chocolate baloon 06/08/13, Previous PTA/Stent to Rt SFA 05/10/13 04/13/2013   COPD III 04/13/2013   HTN (hypertension) 04/13/2013   Anxiety 04/13/2013   Low back pain 04/13/2013   PCP:  Sheliah Hatch, MD Pharmacy:   [facility] [facility] [address] - [place], [place] - [address] [place] [address] Phone: [phone] Fax: [phone]  [facility] - [place], [place] - [place] [address] [place] [address] Phone: [phone] Fax: [phone]     Social Determinants of Health (SDOH) Interventions    Readmission Risk Interventions No flowsheet data found.

## 2021-07-11 NOTE — Progress Notes (Signed)
Mobility Specialist: Progress Note   07/11/21 1729  Mobility  Activity Ambulated in hall  Level of Assistance Modified independent, requires aide device or extra time  Assistive Device Front wheel walker  Distance Ambulated (ft) 132 ft  Mobility Ambulated with assistance in hallway  Mobility Response Tolerated well  Mobility performed by Mobility specialist  $Mobility charge 1 Mobility   Pre-Mobility: 92 HR, 97% SpO2 Post-Mobility: 97 HR  Pt ambulated on 2 L/min [place], no c/o throughout. Pt back to bed after walk per request with call bell and phone at his side.   [facility] Christopher Pena Mobility Specialist Mobility Specialist Phone #1: [phone] Mobility Specialist Phone #2: [phone]

## 2021-07-11 NOTE — Progress Notes (Signed)
Occupational Therapy Treatment Patient Details Name: Christopher Pena MRN: [id number] DOB: Jul 30, 1946 Today's Date: 07/11/2021   History of present illness Pt is a 75 y.o. male admitted on 07/05/21 with an infected bypass graft of the LLE. S/p bypass excision on 11/17. S/p LLE washout and debridement, wound closure 11/22. PMH includes claudication, non healing LE wound, tobacco use, COPD, chronic low back pain; of note, pt has undergone multiple LLE revascularizations and a R femoral to popliteal graft.   OT comments  Pt progressing well towards OT goals. Session focused on progression of standing balance to improve ability to gather ADL/IADL items safely while using DME in the home. Pt able to demo mobility with Supervision with challenges to reach overhead, laterally outside of BOS and to pick up items from floor with no UE to one UE support. No LOB during these tasks. Pt shows good insight into energy conservation strategies for implementation in home environment. Pt did require Min A for sock mgmt during session though pt attributes this to bandages on L LE. DC recs remain appropriate with PRN support for transportation, groceries at DC.  SpO2 >93% on 3 L O2 with mobility HR up to 126bpm with activity BP stable pre and post activity (130s/60s-80s)   Recommendations for follow up therapy are one component of a multi-disciplinary discharge planning process, led by the attending physician.  Recommendations may be updated based on patient status, additional functional criteria and insurance authorization.    Follow Up Recommendations  No OT follow up    Assistance Recommended at Discharge PRN  Equipment Recommendations  None recommended by OT    Recommendations for Other Services      Precautions / Restrictions Precautions Precautions: Fall;Other (comment) Precaution Comments: monitor O2, soft BP Restrictions Weight Bearing Restrictions: No       Mobility Bed Mobility Overal bed  mobility: Modified Independent                  Transfers Overall transfer level: Modified independent Equipment used: Rolling walker (2 wheels) Transfers: Sit to/from Stand Sit to Stand: Modified independent (Device/Increase time)                 Balance Overall balance assessment: Needs assistance Sitting-balance support: No upper extremity supported;Feet supported Sitting balance-Leahy Scale: Good     Standing balance support: No upper extremity supported;Bilateral upper extremity supported;Single extremity supported;During functional activity Standing balance-Leahy Scale: Fair Standing balance comment: can static stand wtihout UE support and reach outside BOS statically; at least one UE support for dynamic tasks                           ADL either performed or assessed with clinical judgement   ADL Overall ADL's : Needs assistance/impaired                 Upper Body Dressing : Independent;Sitting   Lower Body Dressing: Minimal assistance;Sit to/from stand Lower Body Dressing Details (indicate cue type and reason): able to don R sock without issue, assist to don L sock around toes - reports difficulty bending knee to reach due to bandage s/p debridement             Functional mobility during ADLs: Supervision/safety;Rolling walker (2 wheels) General ADL Comments: Improving insight into dynamic balance deficits though skills are improving. Good awareness of energy conservation strategies and when to implement these skills at home. Guided pt in simulated gathering of ADL  items reaching above and laterally outside of BOS, able to do so with one to no UE support when standing statically and place items in bag tied to RW. Pt able to bend to pick up item from floor without LOB but endorses increased SOB with this - educated to use reacher at home as needed to access items from floor    Extremity/Trunk Assessment Upper Extremity Assessment Upper  Extremity Assessment: Overall WFL for tasks assessed   Lower Extremity Assessment Lower Extremity Assessment: Defer to PT evaluation        Vision   Vision Assessment?: No apparent visual deficits   Perception     Praxis      Cognition Arousal/Alertness: Awake/alert Behavior During Therapy: WFL for tasks assessed/performed Overall Cognitive Status: No family/caregiver present to determine baseline cognitive functioning Area of Impairment: Safety/judgement;Following commands                       Following Commands: Follows one step commands consistently;Follows one step commands with increased time;Follows multi-step commands consistently;Follows multi-step commands with increased time Safety/Judgement: Decreased awareness of safety     General Comments: improving awareness of safety techniques and current deficits - reports plan to use DME for mobility at home; shows good insight into cardiopulmonary function/endurance          Exercises     Shoulder Instructions       General Comments BP 130s/60s pre-activity and 1302/80s post activity. HR up to 126 with activity, decreases to 106 at rest. SpO2 94% on 3 L during mobility    Pertinent Vitals/ Pain       Pain Assessment: No/denies pain Pain Intervention(s): Monitored during session  Home Living                                          Prior Functioning/Environment              Frequency  Min 2X/week        Progress Toward Goals  OT Goals(current goals can now be found in the care plan section)  Progress towards OT goals: Progressing toward goals  Acute Rehab OT Goals Patient Stated Goal: do whatever I need to heal wound and return to independence OT Goal Formulation: With patient Time For Goal Achievement: 07/20/21 Potential to Achieve Goals: Good ADL Goals Pt Will Perform Lower Body Bathing: with modified independence;sit to/from stand Pt Will Transfer to Toilet: with  modified independence;ambulating Pt Will Perform Tub/Shower Transfer: Tub transfer;with modified independence;ambulating;rolling walker Additional ADL Goal #1: Pt to gather ADL items in room at Mod I  Plan Discharge plan remains appropriate    Co-evaluation                 AM-PAC OT "6 Clicks" Daily Activity     Outcome Measure   Help from another person eating meals?: None Help from another person taking care of personal grooming?: None Help from another person toileting, which includes using toliet, bedpan, or urinal?: A Little Help from another person bathing (including washing, rinsing, drying)?: A Little Help from another person to put on and taking off regular upper body clothing?: None Help from another person to put on and taking off regular lower body clothing?: A Little 6 Click Score: 21    End of Session Equipment Utilized During Treatment: Rolling walker (2 wheels);Oxygen  OT  Visit Diagnosis: Unsteadiness on feet (R26.81);Other abnormalities of gait and mobility (R26.89)   Activity Tolerance Patient tolerated treatment well   Patient Left in chair;with call bell/phone within reach   Nurse Communication          Time: 2440-1027 OT Time Calculation (min): 34 min  Charges: OT General Charges $OT Visit: 1 Visit OT Treatments $Self Care/Home Management : 8-22 mins $Therapeutic Activity: 8-22 mins  Bradd Canary, OTR/L Acute Rehab Services Office: [phone]   Lorre Munroe 07/11/2021, 12:29 PM

## 2021-07-12 LAB — GLUCOSE, CAPILLARY
Glucose-Capillary: 120 mg/dL — ABNORMAL HIGH (ref 70–99)
Glucose-Capillary: 111 mg/dL — ABNORMAL HIGH (ref 70–99)

## 2021-07-12 MED ORDER — OXYCODONE HCL 15 MG PO TABS: 15.0000 mg | ORAL_TABLET | Freq: Four times a day (QID) | ORAL | 0 refills | Status: DC | PRN

## 2021-07-12 MED ORDER — AMOXICILLIN-POT CLAVULANATE 875-125 MG PO TABS: 1.0000 | ORAL_TABLET | Freq: Two times a day (BID) | ORAL | 0 refills | Status: AC

## 2021-07-12 NOTE — Progress Notes (Signed)
  Progress Note    07/12/2021 10:47 AM 2 Days Post-Op  Subjective:  ready for d/c home   Vitals:   07/12/21 0335 07/12/21 0729  BP: 128/74 138/76  Pulse: 92 99  Resp: 20 19  Temp: 97.9 F (36.6 C) 98 F (36.7 C)  SpO2: 95% 96%   Physical Exam: Lungs:  non labored Incisions:  L LE incisions c/d/I; some dusky skin edges of L thigh Extremities:  L ATA and PTA by doppler Neurologic: A&O  CBC    Component Value Date/Time   WBC 13.2 (H) 07/11/2021 0046   RBC 3.04 (L) 07/11/2021 0046   HGB 8.0 (L) 07/11/2021 0046   HCT 26.3 (L) 07/11/2021 0046   PLT 444 (H) 07/11/2021 0046   MCV 86.5 07/11/2021 0046   MCH 26.3 07/11/2021 0046   MCHC 30.4 07/11/2021 0046   RDW 15.7 (H) 07/11/2021 0046   LYMPHSABS 0.6 (L) 07/08/2021 1039   MONOABS 1.0 07/08/2021 1039   EOSABS 0.1 07/08/2021 1039   BASOSABS 0.1 07/08/2021 1039    BMET    Component Value Date/Time   NA 137 07/11/2021 0046   K 4.9 07/11/2021 0046   CL 100 07/11/2021 0046   CO2 29 07/11/2021 0046   GLUCOSE 116 (H) 07/11/2021 0046   BUN 13 07/11/2021 0046   CREATININE 0.81 07/11/2021 0046   CREATININE 0.69 05/11/2014 1533   CALCIUM 8.2 (L) 07/11/2021 0046   GFRNONAA >60 07/11/2021 0046   GFRNONAA >89 04/19/2014 1418   GFRAA >60 12/24/2018 0224   GFRAA >89 04/19/2014 1418    INR    Component Value Date/Time   INR 1.2 07/05/2021 1539     Intake/Output Summary (Last 24 hours) at 07/12/2021 1047 Last data filed at 07/12/2021 8938 Gross per 24 hour  Intake 640.04 ml  Output 2130 ml  Net -1489.96 ml      Assessment/Plan:  75 y.o. male is s/p removal of infected LLE bypass with ATA patch 2 Days Post-Op   LLE well perfused by doppler exam LLE incisions well appearing; dusky skin edges at thigh incision HH RN and PT arranged by [facility] team Home today; office will arrange 2 week follow up    Emilie Rutter, PA-C [facility] [phone] 07/12/2021 10:47 AM  VASCULAR STAFF  ADDENDUM: I have independently interviewed and examined the patient. I agree with the above.    Fara Olden, MD [facility] Phone Number: [phone] 07/12/2021 10:48 AM

## 2021-07-12 NOTE — Progress Notes (Signed)
  Progress Note    07/12/2021 9:26 AM 2 Days Post-Op  Subjective:  ready for d/c home   Vitals:   07/12/21 0335 07/12/21 0729  BP: 128/74 138/76  Pulse: 92 99  Resp: 20 19  Temp: 97.9 F (36.6 C) 98 F (36.7 C)  SpO2: 95% 96%   Physical Exam: Lungs:  non labored Incisions:  L LE incisions c/d/I; some dusky skin edges of L thigh Extremities:  L ATA and PTA by doppler Neurologic: A&O  CBC    Component Value Date/Time   WBC 13.2 (H) 07/11/2021 0046   RBC 3.04 (L) 07/11/2021 0046   HGB 8.0 (L) 07/11/2021 0046   HCT 26.3 (L) 07/11/2021 0046   PLT 444 (H) 07/11/2021 0046   MCV 86.5 07/11/2021 0046   MCH 26.3 07/11/2021 0046   MCHC 30.4 07/11/2021 0046   RDW 15.7 (H) 07/11/2021 0046   LYMPHSABS 0.6 (L) 07/08/2021 1039   MONOABS 1.0 07/08/2021 1039   EOSABS 0.1 07/08/2021 1039   BASOSABS 0.1 07/08/2021 1039    BMET    Component Value Date/Time   NA 137 07/11/2021 0046   K 4.9 07/11/2021 0046   CL 100 07/11/2021 0046   CO2 29 07/11/2021 0046   GLUCOSE 116 (H) 07/11/2021 0046   BUN 13 07/11/2021 0046   CREATININE 0.81 07/11/2021 0046   CREATININE 0.69 05/11/2014 1533   CALCIUM 8.2 (L) 07/11/2021 0046   GFRNONAA >60 07/11/2021 0046   GFRNONAA >89 04/19/2014 1418   GFRAA >60 12/24/2018 0224   GFRAA >89 04/19/2014 1418    INR    Component Value Date/Time   INR 1.2 07/05/2021 1539     Intake/Output Summary (Last 24 hours) at 07/12/2021 0926 Last data filed at 07/12/2021 0258 Gross per 24 hour  Intake 640.04 ml  Output 2130 ml  Net -1489.96 ml     Assessment/Plan:  75 y.o. male is s/p removal of infected LLE bypass with ATA patch 2 Days Post-Op   LLE well perfused by doppler exam LLE incisions well appearing; dusky skin edges at thigh incision HH RN and PT arranged by [facility] team Home today; office will arrange 2 week follow up    Emilie Rutter, PA-C [facility] [phone] 07/12/2021 9:26 AM

## 2021-07-13 LAB — CULTURE, BLOOD (SINGLE): Culture: NO GROWTH

## 2021-07-13 LAB — CULTURE, BLOOD (ROUTINE X 2)
Culture: NO GROWTH
Culture: NO GROWTH

## 2021-07-13 NOTE — Discharge Summary (Signed)
Discharge Summary  Patient ID: Christopher Pena [id number] 75 y.o. 1946-05-13  Admit date: 07/05/2021  Discharge date and time: 07/12/2021  4:28 PM   Admitting Physician: Broadus John, MD   Discharge Physician: same  Admission Diagnoses: Superficial foreign body of left leg without major open wound and without infection [S80.852A]  Discharge Diagnoses: same  Admission Condition: fair  Discharged Condition: fair  Indication for Admission: Infected bypass graft  Hospital Course: Christopher Pena is a 75 year old male who presented to the office on 07/09/2021.  He clearly had an infected bypass graft which was external on his left lower extremity.  He was directly admitted to the hospital.  The following day he underwent removal of infected bypass graft with left leg washout, debridement, and closure.  He tolerated the procedure well and remained hospitalized.  Throughout the course of his hospital stay he maintained left anterior tibial and posterior tibial artery signal by Doppler.  He was evaluated by therapy teams who recommended home health PT.  TOC also arranged home health RN to evaluate wounds.  He was prescribed narcotic pain medication for continued postoperative pain control.  He will follow-up for wound check in 2 weeks.  He was also sent home on Augmentin.  He was discharged home in stable condition.  Consults: None  Treatments: surgery: Removal of infected bypass graft with left leg washout, debridement, and closure by Dr. Unk Lightning on 07/10/2021  Discharge Exam: See progress note 07/12/21 Vitals:   07/12/21 0729 07/12/21 1247  BP: 138/76 122/73  Pulse: 99 88  Resp: 19 19  Temp: 98 F (36.7 C) 97.7 F (36.5 C)  SpO2: 96% 91%     Disposition: Discharge disposition: 01-Home or Self Care       Patient Instructions:  Allergies as of 07/12/2021       Reactions   Clindamycin/lincomycin Diarrhea        Medication List     STOP taking these  medications    cilostazol 100 MG tablet Commonly known as: PLETAL       TAKE these medications    acetaminophen 500 MG tablet Commonly known as: TYLENOL Take 500 mg by mouth every 6 (six) hours as needed for headache.   albuterol 108 (90 Base) MCG/ACT inhaler Commonly known as: VENTOLIN HFA Inhale 2 puffs into the lungs every 6 (six) hours as needed for wheezing or shortness of breath.   amoxicillin-clavulanate 875-125 MG tablet Commonly known as: Augmentin Take 1 tablet by mouth 2 (two) times daily for 10 days.   aspirin 81 MG EC tablet Take 1 tablet (81 mg total) by mouth daily at 6 (six) AM. Swallow whole.   clopidogrel 75 MG tablet Commonly known as: PLAVIX Take 75 mg by mouth daily.   ipratropium-albuterol 0.5-2.5 (3) MG/3ML Soln Commonly known as: DUONEB TAKE THREE MLS VIA NEBULIZER EVERY 6 HOURS AS NEEDED What changed: See the new instructions.   naloxone 4 MG/0.1ML Liqd nasal spray kit Commonly known as: NARCAN SMARTSIG:In Nostril   oxyCODONE 15 MG immediate release tablet Commonly known as: ROXICODONE Take 1 tablet (15 mg total) by mouth every 6 (six) hours as needed for pain. What changed: when to take this   rosuvastatin 20 MG tablet Commonly known as: CRESTOR Take 1 tablet (20 mg total) by mouth daily.   sertraline 25 MG tablet Commonly known as: ZOLOFT Take 1 tablet (25 mg total) by mouth daily.       Activity: activity as tolerated Diet: regular diet  Wound Care: keep wound clean and dry  Follow-up with [facility] in 2 weeks.  Signed: Dagoberto Ligas, PA-C 07/13/2021 11:08 AM [facility] Office: [phone]

## 2021-07-17 DIAGNOSIS — M79605 Pain in left leg: Secondary | ICD-10-CM | POA: Diagnosis not present

## 2021-07-17 DIAGNOSIS — Z79891 Long term (current) use of opiate analgesic: Secondary | ICD-10-CM | POA: Diagnosis not present

## 2021-07-17 DIAGNOSIS — M545 Low back pain, unspecified: Secondary | ICD-10-CM | POA: Diagnosis not present

## 2021-07-17 DIAGNOSIS — G894 Chronic pain syndrome: Secondary | ICD-10-CM | POA: Diagnosis not present

## 2021-07-17 DIAGNOSIS — G890 Central pain syndrome: Secondary | ICD-10-CM | POA: Diagnosis not present

## 2021-07-17 DIAGNOSIS — M79604 Pain in right leg: Secondary | ICD-10-CM | POA: Diagnosis not present

## 2021-07-17 DIAGNOSIS — M5432 Sciatica, left side: Secondary | ICD-10-CM | POA: Diagnosis not present

## 2021-07-17 DIAGNOSIS — M5431 Sciatica, right side: Secondary | ICD-10-CM | POA: Diagnosis not present

## 2021-07-18 DIAGNOSIS — E785 Hyperlipidemia, unspecified: Secondary | ICD-10-CM | POA: Diagnosis not present

## 2021-07-18 DIAGNOSIS — I998 Other disorder of circulatory system: Secondary | ICD-10-CM | POA: Diagnosis not present

## 2021-07-18 DIAGNOSIS — T829XXD Unspecified complication of cardiac and vascular prosthetic device, implant and graft, subsequent encounter: Secondary | ICD-10-CM | POA: Diagnosis not present

## 2021-07-18 DIAGNOSIS — F32A Depression, unspecified: Secondary | ICD-10-CM | POA: Diagnosis not present

## 2021-07-18 DIAGNOSIS — K5900 Constipation, unspecified: Secondary | ICD-10-CM | POA: Diagnosis not present

## 2021-07-18 DIAGNOSIS — Z7982 Long term (current) use of aspirin: Secondary | ICD-10-CM | POA: Diagnosis not present

## 2021-07-18 DIAGNOSIS — I739 Peripheral vascular disease, unspecified: Secondary | ICD-10-CM | POA: Diagnosis not present

## 2021-07-18 DIAGNOSIS — J449 Chronic obstructive pulmonary disease, unspecified: Secondary | ICD-10-CM | POA: Diagnosis not present

## 2021-07-18 DIAGNOSIS — I10 Essential (primary) hypertension: Secondary | ICD-10-CM | POA: Diagnosis not present

## 2021-07-18 DIAGNOSIS — L03116 Cellulitis of left lower limb: Secondary | ICD-10-CM | POA: Diagnosis not present

## 2021-07-20 ENCOUNTER — Telehealth: Payer: Self-pay

## 2021-07-20 DIAGNOSIS — T829XXD Unspecified complication of cardiac and vascular prosthetic device, implant and graft, subsequent encounter: Secondary | ICD-10-CM | POA: Diagnosis not present

## 2021-07-20 DIAGNOSIS — K5900 Constipation, unspecified: Secondary | ICD-10-CM | POA: Diagnosis not present

## 2021-07-20 DIAGNOSIS — I739 Peripheral vascular disease, unspecified: Secondary | ICD-10-CM | POA: Diagnosis not present

## 2021-07-20 DIAGNOSIS — I998 Other disorder of circulatory system: Secondary | ICD-10-CM | POA: Diagnosis not present

## 2021-07-20 DIAGNOSIS — E785 Hyperlipidemia, unspecified: Secondary | ICD-10-CM | POA: Diagnosis not present

## 2021-07-20 DIAGNOSIS — Z7982 Long term (current) use of aspirin: Secondary | ICD-10-CM | POA: Diagnosis not present

## 2021-07-20 DIAGNOSIS — L03116 Cellulitis of left lower limb: Secondary | ICD-10-CM | POA: Diagnosis not present

## 2021-07-20 DIAGNOSIS — J449 Chronic obstructive pulmonary disease, unspecified: Secondary | ICD-10-CM | POA: Diagnosis not present

## 2021-07-20 DIAGNOSIS — F32A Depression, unspecified: Secondary | ICD-10-CM | POA: Diagnosis not present

## 2021-07-20 DIAGNOSIS — I10 Essential (primary) hypertension: Secondary | ICD-10-CM | POA: Diagnosis not present

## 2021-07-20 NOTE — Telephone Encounter (Signed)
Marisue Ivan from [facility] calls today to ask about wound care orders for the patient. They have been doing wet to dry 3x a week, but would like to up that to once a day. Advised this was fine.

## 2021-07-23 DIAGNOSIS — T829XXD Unspecified complication of cardiac and vascular prosthetic device, implant and graft, subsequent encounter: Secondary | ICD-10-CM | POA: Diagnosis not present

## 2021-07-23 DIAGNOSIS — K5900 Constipation, unspecified: Secondary | ICD-10-CM | POA: Diagnosis not present

## 2021-07-23 DIAGNOSIS — I998 Other disorder of circulatory system: Secondary | ICD-10-CM | POA: Diagnosis not present

## 2021-07-23 DIAGNOSIS — I10 Essential (primary) hypertension: Secondary | ICD-10-CM | POA: Diagnosis not present

## 2021-07-23 DIAGNOSIS — E785 Hyperlipidemia, unspecified: Secondary | ICD-10-CM | POA: Diagnosis not present

## 2021-07-23 DIAGNOSIS — I739 Peripheral vascular disease, unspecified: Secondary | ICD-10-CM | POA: Diagnosis not present

## 2021-07-23 DIAGNOSIS — Z7982 Long term (current) use of aspirin: Secondary | ICD-10-CM | POA: Diagnosis not present

## 2021-07-23 DIAGNOSIS — F32A Depression, unspecified: Secondary | ICD-10-CM | POA: Diagnosis not present

## 2021-07-23 DIAGNOSIS — J449 Chronic obstructive pulmonary disease, unspecified: Secondary | ICD-10-CM | POA: Diagnosis not present

## 2021-07-23 DIAGNOSIS — L03116 Cellulitis of left lower limb: Secondary | ICD-10-CM | POA: Diagnosis not present

## 2021-07-24 DIAGNOSIS — J449 Chronic obstructive pulmonary disease, unspecified: Secondary | ICD-10-CM | POA: Diagnosis not present

## 2021-07-24 DIAGNOSIS — I998 Other disorder of circulatory system: Secondary | ICD-10-CM | POA: Diagnosis not present

## 2021-07-24 DIAGNOSIS — E785 Hyperlipidemia, unspecified: Secondary | ICD-10-CM | POA: Diagnosis not present

## 2021-07-24 DIAGNOSIS — T829XXD Unspecified complication of cardiac and vascular prosthetic device, implant and graft, subsequent encounter: Secondary | ICD-10-CM | POA: Diagnosis not present

## 2021-07-24 DIAGNOSIS — I739 Peripheral vascular disease, unspecified: Secondary | ICD-10-CM | POA: Diagnosis not present

## 2021-07-24 DIAGNOSIS — K5900 Constipation, unspecified: Secondary | ICD-10-CM | POA: Diagnosis not present

## 2021-07-24 DIAGNOSIS — F32A Depression, unspecified: Secondary | ICD-10-CM | POA: Diagnosis not present

## 2021-07-24 DIAGNOSIS — Z7982 Long term (current) use of aspirin: Secondary | ICD-10-CM | POA: Diagnosis not present

## 2021-07-24 DIAGNOSIS — I10 Essential (primary) hypertension: Secondary | ICD-10-CM | POA: Diagnosis not present

## 2021-07-24 DIAGNOSIS — L03116 Cellulitis of left lower limb: Secondary | ICD-10-CM | POA: Diagnosis not present

## 2021-07-25 DIAGNOSIS — J449 Chronic obstructive pulmonary disease, unspecified: Secondary | ICD-10-CM | POA: Diagnosis not present

## 2021-07-25 DIAGNOSIS — Z7982 Long term (current) use of aspirin: Secondary | ICD-10-CM | POA: Diagnosis not present

## 2021-07-25 DIAGNOSIS — E785 Hyperlipidemia, unspecified: Secondary | ICD-10-CM | POA: Diagnosis not present

## 2021-07-25 DIAGNOSIS — T829XXD Unspecified complication of cardiac and vascular prosthetic device, implant and graft, subsequent encounter: Secondary | ICD-10-CM | POA: Diagnosis not present

## 2021-07-25 DIAGNOSIS — I739 Peripheral vascular disease, unspecified: Secondary | ICD-10-CM | POA: Diagnosis not present

## 2021-07-25 DIAGNOSIS — L03116 Cellulitis of left lower limb: Secondary | ICD-10-CM | POA: Diagnosis not present

## 2021-07-25 DIAGNOSIS — I10 Essential (primary) hypertension: Secondary | ICD-10-CM | POA: Diagnosis not present

## 2021-07-25 DIAGNOSIS — K5900 Constipation, unspecified: Secondary | ICD-10-CM | POA: Diagnosis not present

## 2021-07-25 DIAGNOSIS — F32A Depression, unspecified: Secondary | ICD-10-CM | POA: Diagnosis not present

## 2021-07-25 DIAGNOSIS — I998 Other disorder of circulatory system: Secondary | ICD-10-CM | POA: Diagnosis not present

## 2021-07-26 DIAGNOSIS — F32A Depression, unspecified: Secondary | ICD-10-CM | POA: Diagnosis not present

## 2021-07-26 DIAGNOSIS — Z7982 Long term (current) use of aspirin: Secondary | ICD-10-CM | POA: Diagnosis not present

## 2021-07-26 DIAGNOSIS — J441 Chronic obstructive pulmonary disease with (acute) exacerbation: Secondary | ICD-10-CM | POA: Diagnosis not present

## 2021-07-26 DIAGNOSIS — I739 Peripheral vascular disease, unspecified: Secondary | ICD-10-CM | POA: Diagnosis not present

## 2021-07-26 DIAGNOSIS — L03116 Cellulitis of left lower limb: Secondary | ICD-10-CM | POA: Diagnosis not present

## 2021-07-26 DIAGNOSIS — T829XXD Unspecified complication of cardiac and vascular prosthetic device, implant and graft, subsequent encounter: Secondary | ICD-10-CM | POA: Diagnosis not present

## 2021-07-26 DIAGNOSIS — I10 Essential (primary) hypertension: Secondary | ICD-10-CM | POA: Diagnosis not present

## 2021-07-26 DIAGNOSIS — I998 Other disorder of circulatory system: Secondary | ICD-10-CM | POA: Diagnosis not present

## 2021-07-26 DIAGNOSIS — J9621 Acute and chronic respiratory failure with hypoxia: Secondary | ICD-10-CM | POA: Diagnosis not present

## 2021-07-26 DIAGNOSIS — E785 Hyperlipidemia, unspecified: Secondary | ICD-10-CM | POA: Diagnosis not present

## 2021-07-26 DIAGNOSIS — J449 Chronic obstructive pulmonary disease, unspecified: Secondary | ICD-10-CM | POA: Diagnosis not present

## 2021-07-26 DIAGNOSIS — K5900 Constipation, unspecified: Secondary | ICD-10-CM | POA: Diagnosis not present

## 2021-07-27 DIAGNOSIS — I998 Other disorder of circulatory system: Secondary | ICD-10-CM | POA: Diagnosis not present

## 2021-07-27 DIAGNOSIS — J449 Chronic obstructive pulmonary disease, unspecified: Secondary | ICD-10-CM | POA: Diagnosis not present

## 2021-07-27 DIAGNOSIS — T829XXD Unspecified complication of cardiac and vascular prosthetic device, implant and graft, subsequent encounter: Secondary | ICD-10-CM | POA: Diagnosis not present

## 2021-07-27 DIAGNOSIS — F32A Depression, unspecified: Secondary | ICD-10-CM | POA: Diagnosis not present

## 2021-07-27 DIAGNOSIS — K5900 Constipation, unspecified: Secondary | ICD-10-CM | POA: Diagnosis not present

## 2021-07-27 DIAGNOSIS — Z7982 Long term (current) use of aspirin: Secondary | ICD-10-CM | POA: Diagnosis not present

## 2021-07-27 DIAGNOSIS — E785 Hyperlipidemia, unspecified: Secondary | ICD-10-CM | POA: Diagnosis not present

## 2021-07-27 DIAGNOSIS — I739 Peripheral vascular disease, unspecified: Secondary | ICD-10-CM | POA: Diagnosis not present

## 2021-07-27 DIAGNOSIS — L03116 Cellulitis of left lower limb: Secondary | ICD-10-CM | POA: Diagnosis not present

## 2021-07-27 DIAGNOSIS — I10 Essential (primary) hypertension: Secondary | ICD-10-CM | POA: Diagnosis not present

## 2021-07-31 DIAGNOSIS — I739 Peripheral vascular disease, unspecified: Secondary | ICD-10-CM | POA: Diagnosis not present

## 2021-07-31 DIAGNOSIS — K5900 Constipation, unspecified: Secondary | ICD-10-CM | POA: Diagnosis not present

## 2021-07-31 DIAGNOSIS — F32A Depression, unspecified: Secondary | ICD-10-CM | POA: Diagnosis not present

## 2021-07-31 DIAGNOSIS — J449 Chronic obstructive pulmonary disease, unspecified: Secondary | ICD-10-CM | POA: Diagnosis not present

## 2021-07-31 DIAGNOSIS — T829XXD Unspecified complication of cardiac and vascular prosthetic device, implant and graft, subsequent encounter: Secondary | ICD-10-CM | POA: Diagnosis not present

## 2021-07-31 DIAGNOSIS — L03116 Cellulitis of left lower limb: Secondary | ICD-10-CM | POA: Diagnosis not present

## 2021-07-31 DIAGNOSIS — I998 Other disorder of circulatory system: Secondary | ICD-10-CM | POA: Diagnosis not present

## 2021-07-31 DIAGNOSIS — Z7982 Long term (current) use of aspirin: Secondary | ICD-10-CM | POA: Diagnosis not present

## 2021-07-31 DIAGNOSIS — I10 Essential (primary) hypertension: Secondary | ICD-10-CM | POA: Diagnosis not present

## 2021-07-31 DIAGNOSIS — E785 Hyperlipidemia, unspecified: Secondary | ICD-10-CM | POA: Diagnosis not present

## 2021-08-02 DIAGNOSIS — I739 Peripheral vascular disease, unspecified: Secondary | ICD-10-CM | POA: Diagnosis not present

## 2021-08-02 DIAGNOSIS — I998 Other disorder of circulatory system: Secondary | ICD-10-CM | POA: Diagnosis not present

## 2021-08-02 DIAGNOSIS — F32A Depression, unspecified: Secondary | ICD-10-CM | POA: Diagnosis not present

## 2021-08-02 DIAGNOSIS — Z7982 Long term (current) use of aspirin: Secondary | ICD-10-CM | POA: Diagnosis not present

## 2021-08-02 DIAGNOSIS — K5900 Constipation, unspecified: Secondary | ICD-10-CM | POA: Diagnosis not present

## 2021-08-02 DIAGNOSIS — J449 Chronic obstructive pulmonary disease, unspecified: Secondary | ICD-10-CM | POA: Diagnosis not present

## 2021-08-02 DIAGNOSIS — E785 Hyperlipidemia, unspecified: Secondary | ICD-10-CM | POA: Diagnosis not present

## 2021-08-02 DIAGNOSIS — L03116 Cellulitis of left lower limb: Secondary | ICD-10-CM | POA: Diagnosis not present

## 2021-08-02 DIAGNOSIS — T829XXD Unspecified complication of cardiac and vascular prosthetic device, implant and graft, subsequent encounter: Secondary | ICD-10-CM | POA: Diagnosis not present

## 2021-08-02 DIAGNOSIS — I10 Essential (primary) hypertension: Secondary | ICD-10-CM | POA: Diagnosis not present

## 2021-08-08 DIAGNOSIS — I739 Peripheral vascular disease, unspecified: Secondary | ICD-10-CM | POA: Diagnosis not present

## 2021-08-08 DIAGNOSIS — I998 Other disorder of circulatory system: Secondary | ICD-10-CM | POA: Diagnosis not present

## 2021-08-08 DIAGNOSIS — E785 Hyperlipidemia, unspecified: Secondary | ICD-10-CM | POA: Diagnosis not present

## 2021-08-08 DIAGNOSIS — F32A Depression, unspecified: Secondary | ICD-10-CM | POA: Diagnosis not present

## 2021-08-08 DIAGNOSIS — Z7982 Long term (current) use of aspirin: Secondary | ICD-10-CM | POA: Diagnosis not present

## 2021-08-08 DIAGNOSIS — J449 Chronic obstructive pulmonary disease, unspecified: Secondary | ICD-10-CM | POA: Diagnosis not present

## 2021-08-08 DIAGNOSIS — T829XXD Unspecified complication of cardiac and vascular prosthetic device, implant and graft, subsequent encounter: Secondary | ICD-10-CM | POA: Diagnosis not present

## 2021-08-08 DIAGNOSIS — L03116 Cellulitis of left lower limb: Secondary | ICD-10-CM | POA: Diagnosis not present

## 2021-08-08 DIAGNOSIS — I10 Essential (primary) hypertension: Secondary | ICD-10-CM | POA: Diagnosis not present

## 2021-08-08 DIAGNOSIS — K5900 Constipation, unspecified: Secondary | ICD-10-CM | POA: Diagnosis not present

## 2021-08-09 DIAGNOSIS — I998 Other disorder of circulatory system: Secondary | ICD-10-CM | POA: Diagnosis not present

## 2021-08-09 DIAGNOSIS — I10 Essential (primary) hypertension: Secondary | ICD-10-CM | POA: Diagnosis not present

## 2021-08-09 DIAGNOSIS — K5900 Constipation, unspecified: Secondary | ICD-10-CM | POA: Diagnosis not present

## 2021-08-09 DIAGNOSIS — F32A Depression, unspecified: Secondary | ICD-10-CM | POA: Diagnosis not present

## 2021-08-09 DIAGNOSIS — T829XXD Unspecified complication of cardiac and vascular prosthetic device, implant and graft, subsequent encounter: Secondary | ICD-10-CM | POA: Diagnosis not present

## 2021-08-09 DIAGNOSIS — Z7982 Long term (current) use of aspirin: Secondary | ICD-10-CM | POA: Diagnosis not present

## 2021-08-09 DIAGNOSIS — I739 Peripheral vascular disease, unspecified: Secondary | ICD-10-CM | POA: Diagnosis not present

## 2021-08-09 DIAGNOSIS — L03116 Cellulitis of left lower limb: Secondary | ICD-10-CM | POA: Diagnosis not present

## 2021-08-09 DIAGNOSIS — J449 Chronic obstructive pulmonary disease, unspecified: Secondary | ICD-10-CM | POA: Diagnosis not present

## 2021-08-09 DIAGNOSIS — E785 Hyperlipidemia, unspecified: Secondary | ICD-10-CM | POA: Diagnosis not present

## 2021-08-14 DIAGNOSIS — M79605 Pain in left leg: Secondary | ICD-10-CM | POA: Diagnosis not present

## 2021-08-14 DIAGNOSIS — T829XXD Unspecified complication of cardiac and vascular prosthetic device, implant and graft, subsequent encounter: Secondary | ICD-10-CM | POA: Diagnosis not present

## 2021-08-14 DIAGNOSIS — L03116 Cellulitis of left lower limb: Secondary | ICD-10-CM | POA: Diagnosis not present

## 2021-08-14 DIAGNOSIS — I739 Peripheral vascular disease, unspecified: Secondary | ICD-10-CM | POA: Diagnosis not present

## 2021-08-14 DIAGNOSIS — J449 Chronic obstructive pulmonary disease, unspecified: Secondary | ICD-10-CM | POA: Diagnosis not present

## 2021-08-14 DIAGNOSIS — M5432 Sciatica, left side: Secondary | ICD-10-CM | POA: Diagnosis not present

## 2021-08-14 DIAGNOSIS — M5431 Sciatica, right side: Secondary | ICD-10-CM | POA: Diagnosis not present

## 2021-08-14 DIAGNOSIS — M545 Low back pain, unspecified: Secondary | ICD-10-CM | POA: Diagnosis not present

## 2021-08-14 DIAGNOSIS — I10 Essential (primary) hypertension: Secondary | ICD-10-CM | POA: Diagnosis not present

## 2021-08-14 DIAGNOSIS — G890 Central pain syndrome: Secondary | ICD-10-CM | POA: Diagnosis not present

## 2021-08-14 DIAGNOSIS — I998 Other disorder of circulatory system: Secondary | ICD-10-CM | POA: Diagnosis not present

## 2021-08-14 DIAGNOSIS — Z79891 Long term (current) use of opiate analgesic: Secondary | ICD-10-CM | POA: Diagnosis not present

## 2021-08-14 DIAGNOSIS — F32A Depression, unspecified: Secondary | ICD-10-CM | POA: Diagnosis not present

## 2021-08-14 DIAGNOSIS — G894 Chronic pain syndrome: Secondary | ICD-10-CM | POA: Diagnosis not present

## 2021-08-14 DIAGNOSIS — Z7982 Long term (current) use of aspirin: Secondary | ICD-10-CM | POA: Diagnosis not present

## 2021-08-14 DIAGNOSIS — M79604 Pain in right leg: Secondary | ICD-10-CM | POA: Diagnosis not present

## 2021-08-14 DIAGNOSIS — K5900 Constipation, unspecified: Secondary | ICD-10-CM | POA: Diagnosis not present

## 2021-08-14 DIAGNOSIS — E785 Hyperlipidemia, unspecified: Secondary | ICD-10-CM | POA: Diagnosis not present

## 2021-08-21 ENCOUNTER — Telehealth: Payer: Self-pay | Admitting: Pharmacist

## 2021-08-21 DIAGNOSIS — T829XXD Unspecified complication of cardiac and vascular prosthetic device, implant and graft, subsequent encounter: Secondary | ICD-10-CM | POA: Diagnosis not present

## 2021-08-21 DIAGNOSIS — E785 Hyperlipidemia, unspecified: Secondary | ICD-10-CM | POA: Diagnosis not present

## 2021-08-21 DIAGNOSIS — I10 Essential (primary) hypertension: Secondary | ICD-10-CM | POA: Diagnosis not present

## 2021-08-21 DIAGNOSIS — I739 Peripheral vascular disease, unspecified: Secondary | ICD-10-CM | POA: Diagnosis not present

## 2021-08-21 DIAGNOSIS — Z7982 Long term (current) use of aspirin: Secondary | ICD-10-CM | POA: Diagnosis not present

## 2021-08-21 DIAGNOSIS — J449 Chronic obstructive pulmonary disease, unspecified: Secondary | ICD-10-CM | POA: Diagnosis not present

## 2021-08-21 DIAGNOSIS — K5900 Constipation, unspecified: Secondary | ICD-10-CM | POA: Diagnosis not present

## 2021-08-21 DIAGNOSIS — L03116 Cellulitis of left lower limb: Secondary | ICD-10-CM | POA: Diagnosis not present

## 2021-08-21 DIAGNOSIS — F32A Depression, unspecified: Secondary | ICD-10-CM | POA: Diagnosis not present

## 2021-08-21 DIAGNOSIS — I998 Other disorder of circulatory system: Secondary | ICD-10-CM | POA: Diagnosis not present

## 2021-08-21 NOTE — Progress Notes (Signed)
° ° °  Chronic Care Management Pharmacy Assistant   Name: Christopher Pena  MRN: [id number] DOB: [birth year]   Reason for Encounter: Disease State - General Adherence Call     Recent office visits:  None noted  Recent consult visits:  None noted  Hospital visits: 07/05/21 - 07/12/21 Medication Reconciliation was completed by comparing discharge summary, patients EMR and Pharmacy list, and upon discussion with patient.  Admitted to the hospital on 07/05/21 due to Foreign body in left leg. Discharge date was 07/12/21. Discharged from [facility]    New?Medications Started at [facility] Discharge:?? amoxicillin-clavulanate (AUGMENTIN) 875-125 MG tablet Take 1 tablet by mouth 2 (two) times daily for 10 days  Medication Changes at Hospital Discharge: oxyCODONE (ROXICODONE) 15 MG immediate release tablet  Medications Discontinued at Hospital Discharge: Cilostazol  Medications that remain the same after Hospital Discharge:??  All other medications will remain the same.    Medications: Outpatient Encounter Medications as of 08/21/2021  Medication Sig   acetaminophen (TYLENOL) 500 MG tablet Take 500 mg by mouth every 6 (six) hours as needed for headache.   albuterol (VENTOLIN HFA) 108 (90 Base) MCG/ACT inhaler Inhale 2 puffs into the lungs every 6 (six) hours as needed for wheezing or shortness of breath.   aspirin EC 81 MG EC tablet Take 1 tablet (81 mg total) by mouth daily at 6 (six) AM. Swallow whole.   clopidogrel (PLAVIX) 75 MG tablet Take 75 mg by mouth daily.   ipratropium-albuterol (DUONEB) 0.5-2.5 (3) MG/3ML SOLN TAKE THREE MLS VIA NEBULIZER EVERY 6 HOURS AS NEEDED (Patient taking differently: Inhale 3 mLs into the lungs every 6 (six) hours as needed (shortness of breath).)   naloxone (NARCAN) nasal spray 4 mg/0.1 mL SMARTSIG:In Nostril   oxyCODONE (ROXICODONE) 15 MG immediate release tablet Take 1 tablet (15 mg total) by mouth every 6 (six) hours as needed for pain.    rosuvastatin (CRESTOR) 20 MG tablet Take 1 tablet (20 mg total) by mouth daily.   sertraline (ZOLOFT) 25 MG tablet Take 1 tablet (25 mg total) by mouth daily.   No facility-administered encounter medications on file as of 08/21/2021.    Have you had any problems recently with your health? Patient reported he was in the hospital since last visit. He is doing well and much better.   Have you had any problems with your pharmacy? He is happy with using [facility] as his pharmacy.   What issues or side effects are you having with your medications? Patient denied any side effects or issues with his current medications.   What would you like me to pass along to Erskine Emery, CPP for them to help you with?  Patient did not have anything to pass along at this time to CPP.   What can we do to take care of you better? Patient had no recommendations at this time and is currently happy with his current level of care.  Care Gaps   AWV: overdue Colonoscopy: unknown DM Eye Exam: N/A DM Foot Exam: N/A Microalbumin: N/A HbgAIC: N/A DEXA: N/A Mammogram: N/A   Star Rating Drugs: Rosuvastatin (CRESTOR) 20 MG tablet - last filled 06/15/21 90 days   Future Appointments  Date Time Provider Department Center  08/22/2021 12:00 PM [facility]    Christopher Pena, CCMA Clinical Pharmacist Assistant  [phone]

## 2021-08-22 ENCOUNTER — Ambulatory Visit (INDEPENDENT_AMBULATORY_CARE_PROVIDER_SITE_OTHER): Payer: Medicare Other | Admitting: Physician Assistant

## 2021-08-22 ENCOUNTER — Other Ambulatory Visit: Payer: Self-pay

## 2021-08-22 VITALS — BP 143/85 | HR 94 | Temp 98.2°F | Resp 20 | Ht 67.0 in | Wt 152.0 lb

## 2021-08-22 DIAGNOSIS — T827XXS Infection and inflammatory reaction due to other cardiac and vascular devices, implants and grafts, sequela: Secondary | ICD-10-CM

## 2021-08-22 DIAGNOSIS — I779 Disorder of arteries and arterioles, unspecified: Secondary | ICD-10-CM

## 2021-08-22 MED ORDER — CEPHALEXIN 500 MG PO CAPS: 500.0000 mg | ORAL_CAPSULE | Freq: Two times a day (BID) | ORAL | 0 refills | Status: DC

## 2021-08-22 NOTE — Progress Notes (Signed)
POST OPERATIVE OFFICE NOTE    CC:  F/u for surgery  HPI:  This is a 76 y.o. male who is s/p left lower extremity bypass excision, left greater saphenous vein harvest, left anterior tibial vein patch on 07/05/21 by Dr. Karin Lieu. This was performed secondary to infected bypass graft. He subsequently underwent a left leg washout, debridement and closure of his left leg wounds on 07/10/21 by Dr. Karin Lieu. He has been receiving at home wound care since his discharge. He was suppose to be seen on 12/23 but unfortunately due to a power outage at our office he returns today for his post operative follow up.  He denies any pain in his left leg on ambulation or rest. No new wounds. He does report that the lower aspect of his lateral incision started getting red and draining over the past several days but otherwise he feels that his incisions are healing well. He gets assistance with wound care by home health RN. He explains that he removed the staples and sutures from his thigh incision and some staples from his lateral leg incision. He says he told the home health RN he would take the rest out but she told him not to and that some were embedded and would need to be taken out at this visit. He denies any fever or chills.    Allergies  Allergen Reactions   Clindamycin/Lincomycin Diarrhea    Current Outpatient Medications  Medication Sig Dispense Refill   acetaminophen (TYLENOL) 500 MG tablet Take 500 mg by mouth every 6 (six) hours as needed for headache.     albuterol (VENTOLIN HFA) 108 (90 Base) MCG/ACT inhaler Inhale 2 puffs into the lungs every 6 (six) hours as needed for wheezing or shortness of breath. 18 g 3   aspirin EC 81 MG EC tablet Take 1 tablet (81 mg total) by mouth daily at 6 (six) AM. Swallow whole.     cephALEXin (KEFLEX) 500 MG capsule Take 1 capsule (500 mg total) by mouth 2 (two) times daily. 14 capsule 0   clopidogrel (PLAVIX) 75 MG tablet Take 75 mg by mouth daily.      ipratropium-albuterol (DUONEB) 0.5-2.5 (3) MG/3ML SOLN TAKE THREE MLS VIA NEBULIZER EVERY 6 HOURS AS NEEDED (Patient taking differently: Inhale 3 mLs into the lungs every 6 (six) hours as needed (shortness of breath).) 360 mL 0   naloxone (NARCAN) nasal spray 4 mg/0.1 mL SMARTSIG:In Nostril     oxyCODONE (ROXICODONE) 15 MG immediate release tablet Take 1 tablet (15 mg total) by mouth every 6 (six) hours as needed for pain. 20 tablet 0   rosuvastatin (CRESTOR) 20 MG tablet Take 1 tablet (20 mg total) by mouth daily. 30 tablet 11   sertraline (ZOLOFT) 25 MG tablet Take 1 tablet (25 mg total) by mouth daily. 90 tablet 2   No current facility-administered medications for this visit.     ROS:  See HPI  Physical Exam:  Vitals:   08/22/21 1227  BP: (!) 143/85  Pulse: 94  Resp: 20  Temp: 98.2 F (36.8 C)  TempSrc: Temporal  SpO2: 93%  Weight: 152 lb (68.9 kg)  Height: 5\' 7"  (1.702 m)   General: well appearing, not in any distress Incision:  left groin with small wound. Hypergranulation tissue present. Area cleaned and dry dressings applied.     Left lateral thigh incision. Healing well. Mild eschars present. Examined for any remaining staples or sutures and none identified  Left lateral knee wound almost completely  healed  Left lateral and medial leg incisions. Staples removed. Mattress sutures removed from lateral incision. Some sutures embedded. Distal aspect of incision erythematous, swollen and draining serous to purulent drainage.  Extremities:  well perfused and warm. Doppler right monophasic Pero and PT. Doppler left pero monophasic. Motor and sensation intact Neuro: alert and oriented  Assessment/Plan:  This is a 76 y.o. male who is s/p left lower extremity bypass excision, left greater saphenous vein harvest, left anterior tibial vein patch on 07/05/21 by Dr. Karin Lieu. This was performed secondary to infected bypass graft. He subsequently underwent a left leg washout, debridement  and closure of his left leg wounds on 07/10/21 by Dr. Karin Lieu. His wounds are surprisingly healing very well. The left lateral and medial lower leg incision staples were removed and to best of my ability the sutures were removed from the lateral lower leg incision. Several of the sutures are embedded and will eventually make there way out of the skin with time. The lateral incision does have some serous to purulent drainage and is swollen and erythematous. I have sent a Rx for Keflex 500 mg BID x 7 days to his pharmacy. -I will have him return in 3-4 weeks for wound check. He knows to call sooner if he has any concerns about appearance of his wounds   Graceann Congress, PA-C [facility] [phone]  Clinic MD:  Dickson/ Randie Heinz

## 2021-08-26 DIAGNOSIS — J9621 Acute and chronic respiratory failure with hypoxia: Secondary | ICD-10-CM | POA: Diagnosis not present

## 2021-08-26 DIAGNOSIS — J441 Chronic obstructive pulmonary disease with (acute) exacerbation: Secondary | ICD-10-CM | POA: Diagnosis not present

## 2021-08-29 DIAGNOSIS — I998 Other disorder of circulatory system: Secondary | ICD-10-CM | POA: Diagnosis not present

## 2021-08-29 DIAGNOSIS — Z7982 Long term (current) use of aspirin: Secondary | ICD-10-CM | POA: Diagnosis not present

## 2021-08-29 DIAGNOSIS — T829XXD Unspecified complication of cardiac and vascular prosthetic device, implant and graft, subsequent encounter: Secondary | ICD-10-CM | POA: Diagnosis not present

## 2021-08-29 DIAGNOSIS — I739 Peripheral vascular disease, unspecified: Secondary | ICD-10-CM | POA: Diagnosis not present

## 2021-08-29 DIAGNOSIS — K5900 Constipation, unspecified: Secondary | ICD-10-CM | POA: Diagnosis not present

## 2021-08-29 DIAGNOSIS — E785 Hyperlipidemia, unspecified: Secondary | ICD-10-CM | POA: Diagnosis not present

## 2021-08-29 DIAGNOSIS — I10 Essential (primary) hypertension: Secondary | ICD-10-CM | POA: Diagnosis not present

## 2021-08-29 DIAGNOSIS — L03116 Cellulitis of left lower limb: Secondary | ICD-10-CM | POA: Diagnosis not present

## 2021-08-29 DIAGNOSIS — F32A Depression, unspecified: Secondary | ICD-10-CM | POA: Diagnosis not present

## 2021-08-29 DIAGNOSIS — J449 Chronic obstructive pulmonary disease, unspecified: Secondary | ICD-10-CM | POA: Diagnosis not present

## 2021-09-06 DIAGNOSIS — I10 Essential (primary) hypertension: Secondary | ICD-10-CM | POA: Diagnosis not present

## 2021-09-06 DIAGNOSIS — T829XXD Unspecified complication of cardiac and vascular prosthetic device, implant and graft, subsequent encounter: Secondary | ICD-10-CM | POA: Diagnosis not present

## 2021-09-06 DIAGNOSIS — E785 Hyperlipidemia, unspecified: Secondary | ICD-10-CM | POA: Diagnosis not present

## 2021-09-06 DIAGNOSIS — J449 Chronic obstructive pulmonary disease, unspecified: Secondary | ICD-10-CM | POA: Diagnosis not present

## 2021-09-06 DIAGNOSIS — L03116 Cellulitis of left lower limb: Secondary | ICD-10-CM | POA: Diagnosis not present

## 2021-09-06 DIAGNOSIS — K5900 Constipation, unspecified: Secondary | ICD-10-CM | POA: Diagnosis not present

## 2021-09-06 DIAGNOSIS — Z7982 Long term (current) use of aspirin: Secondary | ICD-10-CM | POA: Diagnosis not present

## 2021-09-06 DIAGNOSIS — I998 Other disorder of circulatory system: Secondary | ICD-10-CM | POA: Diagnosis not present

## 2021-09-06 DIAGNOSIS — I739 Peripheral vascular disease, unspecified: Secondary | ICD-10-CM | POA: Diagnosis not present

## 2021-09-06 DIAGNOSIS — F32A Depression, unspecified: Secondary | ICD-10-CM | POA: Diagnosis not present

## 2021-09-10 DIAGNOSIS — T8189XA Other complications of procedures, not elsewhere classified, initial encounter: Secondary | ICD-10-CM | POA: Diagnosis not present

## 2021-09-10 DIAGNOSIS — S31104A Unspecified open wound of abdominal wall, left lower quadrant without penetration into peritoneal cavity, initial encounter: Secondary | ICD-10-CM | POA: Diagnosis not present

## 2021-09-10 DIAGNOSIS — S81812A Laceration without foreign body, left lower leg, initial encounter: Secondary | ICD-10-CM | POA: Diagnosis not present

## 2021-09-10 DIAGNOSIS — S71102A Unspecified open wound, left thigh, initial encounter: Secondary | ICD-10-CM | POA: Diagnosis not present

## 2021-09-11 DIAGNOSIS — M545 Low back pain, unspecified: Secondary | ICD-10-CM | POA: Diagnosis not present

## 2021-09-11 DIAGNOSIS — M79604 Pain in right leg: Secondary | ICD-10-CM | POA: Diagnosis not present

## 2021-09-11 DIAGNOSIS — M5432 Sciatica, left side: Secondary | ICD-10-CM | POA: Diagnosis not present

## 2021-09-11 DIAGNOSIS — M5431 Sciatica, right side: Secondary | ICD-10-CM | POA: Diagnosis not present

## 2021-09-11 DIAGNOSIS — G890 Central pain syndrome: Secondary | ICD-10-CM | POA: Diagnosis not present

## 2021-09-11 DIAGNOSIS — G894 Chronic pain syndrome: Secondary | ICD-10-CM | POA: Diagnosis not present

## 2021-09-11 DIAGNOSIS — M79605 Pain in left leg: Secondary | ICD-10-CM | POA: Diagnosis not present

## 2021-09-11 DIAGNOSIS — Z79891 Long term (current) use of opiate analgesic: Secondary | ICD-10-CM | POA: Diagnosis not present

## 2021-09-12 DIAGNOSIS — L03116 Cellulitis of left lower limb: Secondary | ICD-10-CM | POA: Diagnosis not present

## 2021-09-12 DIAGNOSIS — F32A Depression, unspecified: Secondary | ICD-10-CM | POA: Diagnosis not present

## 2021-09-12 DIAGNOSIS — T829XXD Unspecified complication of cardiac and vascular prosthetic device, implant and graft, subsequent encounter: Secondary | ICD-10-CM | POA: Diagnosis not present

## 2021-09-12 DIAGNOSIS — J449 Chronic obstructive pulmonary disease, unspecified: Secondary | ICD-10-CM | POA: Diagnosis not present

## 2021-09-12 DIAGNOSIS — I10 Essential (primary) hypertension: Secondary | ICD-10-CM | POA: Diagnosis not present

## 2021-09-12 DIAGNOSIS — K5900 Constipation, unspecified: Secondary | ICD-10-CM | POA: Diagnosis not present

## 2021-09-12 DIAGNOSIS — I739 Peripheral vascular disease, unspecified: Secondary | ICD-10-CM | POA: Diagnosis not present

## 2021-09-12 DIAGNOSIS — E785 Hyperlipidemia, unspecified: Secondary | ICD-10-CM | POA: Diagnosis not present

## 2021-09-12 DIAGNOSIS — I998 Other disorder of circulatory system: Secondary | ICD-10-CM | POA: Diagnosis not present

## 2021-09-12 DIAGNOSIS — Z7982 Long term (current) use of aspirin: Secondary | ICD-10-CM | POA: Diagnosis not present

## 2021-09-17 ENCOUNTER — Encounter: Payer: Self-pay | Admitting: Physician Assistant

## 2021-09-17 ENCOUNTER — Other Ambulatory Visit: Payer: Self-pay

## 2021-09-17 ENCOUNTER — Ambulatory Visit (INDEPENDENT_AMBULATORY_CARE_PROVIDER_SITE_OTHER): Payer: Medicare Other | Admitting: Physician Assistant

## 2021-09-17 VITALS — BP 144/75 | HR 69 | Temp 97.2°F | Resp 16 | Ht 67.0 in | Wt 152.0 lb

## 2021-09-17 DIAGNOSIS — I779 Disorder of arteries and arterioles, unspecified: Secondary | ICD-10-CM

## 2021-09-17 NOTE — Progress Notes (Signed)
[facility] [place] HISTORY AND PHYSICAL   History of Present Illness:  Patient is a 76 y.o. year old male who presents for evaluation of left LE wounds s/p left bypass  PTFE infection and removal  by Dr. Karin Lieuobins 07/05/21.   His bypass graft was chronically occluded.  Followed by I & D a second time  with incisional closures 07/10/21.   He had a previous  left femoral thrombectomy with left iliofemoral endarterectomy with bovine pericardial patch angioplasty by Dr. Myra GianottiBrabham on 12/08/2020. He had previously undergone a vein femoral tibial bypass and a Gore-Tex femoral-tibial bypass.     He is here today for incision checks.  On his last visit he had hit his shin on the left LE which caused erythema.  He was prescribed Keflex for 7 days which he completed.  [facility] for wound exam.    He denies fever and chills.  No drainage at this time from any incision.    He denies short distance claudication, rest pain or non healing wound.  His ambulation is limited severely by COPD and home oxygen needs.     Past Medical History:  Diagnosis Date   Anxiety    Chronic low back pain    "degenerative spine dx'd ~ 7 yr ago" (06/08/2013)   Claudication [facility](HCC)    Constipation due to pain medication    COPD (chronic obstructive pulmonary disease) (HCC)    Hypertension    "moderately high; RX didn't help" (06/08/2013) - no longer on meds (as of 09/19/16)   Neuromuscular disorder (HCC)    degen spine   Non-healing wound of lower extremity 06/09/2013   PAD (peripheral artery disease), PTA/stent IDEV & chocolate baloon 06/08/13 04/13/2013   Severely reduced ABI's of 0.3 bilaterally    Tobacco use 06/09/2013   Unexplained weight loss     Past Surgical History:  Procedure Laterality Date   ABDOMINAL AORTAGRAM  05/10/2013   Procedure: ABDOMINAL Ronny FlurryAORTAGRAM;  Surgeon: Runell GessJonathan J Berry, MD;  [facility];  Service: Cardiovascular;;   ABI     04/09/13; abnormal    ANGIOPLASTY Left 05/21/2016   Procedure: ballon ANGIOPLASTY of femoral to anterior to tibial bypass graft;  Surgeon: Nada LibmanVance W Brabham, MD;  [facility];  Service: Vascular;  Laterality: Left;   APPENDECTOMY  05/2001   ENDARTERECTOMY FEMORAL Left 12/08/2020   Procedure: REDO LEFT FEMORAL ARTERY ENDARTERECTOMY, LEFT FEMORAL ARTERY THROMBECTOMY;  Surgeon: Nada LibmanBrabham, Vance W, MD;  [facility]: [facility] OR;  Service: Vascular;  Laterality: Left;   FEMORAL ARTERY STENT Right 05/10/13   2 stents Rt SFA   FEMORAL ARTERY STENT Left 06/08/2013   FEMORAL-POPLITEAL BYPASS GRAFT Right 11/12/2013   Procedure: RIGHT  FEMORAL-BELOW KNEE POPLITEAL ARTERY BYPASS GRAFT USING NON- REVERSE RIGHT GREATER SAPHENOUS VEIN.;  Surgeon: Nada LibmanVance W Brabham, MD;  Location: [facility] OR;  Service: Vascular;  Laterality: Right;   FEMORAL-TIBIAL BYPASS GRAFT Left 12/08/2015   Procedure:  LEFT FEMORAL-ANTERIOR TIBIAL ARTERY BYPASS GRAFT;  Surgeon: Nada LibmanVance W Brabham, MD;  Location: [facility] OR;  Service: Vascular;  Laterality: Left;   FEMORAL-TIBIAL BYPASS GRAFT Left 09/20/2016   Procedure: REDO BYPASS GRAFT FEMORAL-ANTERIOR TIBIAL ARTERY;  Surgeon: Nada LibmanVance W Brabham, MD;  Location: [facility] OR;  Service: Vascular;  Laterality: Left;   I & D EXTREMITY Left 07/05/2021   Procedure: IRRIGATION AND DEBRIDEMENT LEFT LOWER EXTREMITY;  Surgeon: Victorino Sparrowobins, Joshua E, MD;  [facility];  Service: Vascular;  Laterality: Left;  I & D EXTREMITY Left 07/10/2021   Procedure: IRRIGATION AND DEBRIDEMENT LEFT LEG WOUND;  Surgeon: Victorino Sparrowobins, Joshua E, MD;  [facility];  Service: Vascular;  Laterality: Left;   LOWER EXTREMITY ANGIOGRAM Bilateral 05/10/2013   Procedure: LOWER EXTREMITY ANGIOGRAM;  Surgeon: Runell GessJonathan J Berry, MD;  [facility];  Service: Cardiovascular;  Laterality: Bilateral;   LOWER EXTREMITY ANGIOGRAM N/A 06/08/2013   Procedure: LOWER EXTREMITY ANGIOGRAM;  Surgeon: Runell GessJonathan J Berry, MD;  [facility];  Service: Cardiovascular;  Laterality: N/A;   LOWER  EXTREMITY ANGIOGRAM N/A 10/28/2013   Procedure: LOWER EXTREMITY ANGIOGRAM;  Surgeon: Runell GessJonathan J Berry, MD;  [facility];  Service: Cardiovascular;  Laterality: N/A;   LOWER EXTREMITY ANGIOGRAM N/A 05/16/2014   Procedure: LOWER EXTREMITY ANGIOGRAM;  Surgeon: Runell GessJonathan J Berry, MD;  [facility];  Service: Cardiovascular;  Laterality: N/A;   PATCH ANGIOPLASTY Left 12/08/2020   Procedure: WITH PATCH ANGIOPLASTY USING 1 X 6 CM XENOSURE BIOLOGIC PATCH;  Surgeon: Nada LibmanBrabham, Vance W, MD;  [facility];  Service: Vascular;  Laterality: Left;   PATCH ANGIOPLASTY Left 07/05/2021   Procedure: ANTERIOR TIBIAL ARTERY PATCH USING GREATER SAPHENOUS VEIN;  Surgeon: Victorino Sparrowobins, Joshua E, MD;  [facility];  Service: Vascular;  Laterality: Left;   PERCUTANEOUS STENT INTERVENTION Right 05/10/2013   Procedure: PERCUTANEOUS STENT INTERVENTION;  Surgeon: Runell GessJonathan J Berry, MD;  [facility];  Service: Cardiovascular;  Laterality: Right;  rt sfa and popliteal stent x2   PERIPHERAL VASCULAR CATHETERIZATION N/A 12/06/2015   Procedure: Abdominal Aortogram w/Lower Extremity;  Surgeon: Nada LibmanVance W Brabham, MD;  [facility] INVASIVE CV LAB;  Service: Cardiovascular;  Laterality: N/A;   PERIPHERAL VASCULAR CATHETERIZATION N/A 05/22/2016   Procedure: Abdominal Aortogram w/Lower Extremity;  Surgeon: Maeola HarmanBrandon Christopher Cain, MD;  [facility];  Service: Cardiovascular;  Laterality: N/A;   PERIPHERAL VASCULAR CATHETERIZATION N/A 09/17/2016   Procedure: Abdominal Aortogram w/Lower Extremity;  Surgeon: Nada LibmanVance W Brabham, MD;  [facility] INVASIVE CV LAB;  Service: Cardiovascular;  Laterality: N/A;   REMOVAL OF GRAFT Left 07/05/2021   Procedure: RESECTION OF LEFT LOWER EXTREMITY BYPASS GRAFT;  Surgeon: Victorino Sparrowobins, Joshua E, MD;  [facility];  Service: Vascular;  Laterality: Left;   THROMBECTOMY FEMORAL ARTERY Left 05/21/2016   Procedure: THROMBECTOMY FEMORAL TO ANTERIOR TIBIAL BYPASS GRAFT;  Surgeon:  Nada LibmanVance W Brabham, MD;  [facility];  Service: Vascular;  Laterality: Left;   VEIN HARVEST Left 12/08/2015   Procedure: USING NON REVERSE  LEFT GREATER SAPHENOUS VEIN;  Surgeon: Nada LibmanVance W Brabham, MD;  [facility];  Service: Vascular;  Laterality: Left;   VEIN HARVEST Left 07/05/2021   Procedure: VEIN HARVEST LEFT GREATER SAPHENOUS;  Surgeon: Victorino Sparrowobins, Joshua E, MD;  [facility]: [facility] OR;  Service: Vascular;  Laterality: Left;    ROS:   General:  No weight loss, Fever, chills  HEENT: No recent headaches, no nasal bleeding, no visual changes, no sore throat  Neurologic: No dizziness, blackouts, seizures. No recent symptoms of stroke or mini- stroke. No recent episodes of slurred speech, or temporary blindness.  Cardiac: No recent episodes of chest pain/pressure, no shortness of breath at rest.  No shortness of breath with exertion.  Denies history of atrial fibrillation or irregular heartbeat  Vascular: No history of rest pain in feet.  positive history of claudication.  positive history of non-healing ulcer, No history of DVT   Pulmonary: No home oxygen, no productive cough, no hemoptysis,  No asthma or wheezing  Musculoskeletal:  [ ]  Arthritis, [ ]  Low back pain,  [ ]  Joint pain  Hematologic:No history of hypercoagulable state.  No history of easy bleeding.  No history of anemia  Gastrointestinal: No hematochezia or melena,  No gastroesophageal reflux, no trouble swallowing  Urinary: [ ]  chronic Kidney disease, [ ]  on HD - [ ]  MWF or [ ]  TTHS, [ ]  Burning with urination, [ ]  Frequent urination, [ ]  Difficulty urinating;   Skin: No rashes  Psychological: No history of anxiety,  No history of depression  Social History Social History   Tobacco Use   Smoking status: Former    Packs/day: 0.25    Years: 50.00    Pack years: 12.50    Types: Cigarettes    Start date: 11/14/2015    Quit date: 11/13/2016    Years since quitting: 4.8   Smokeless tobacco: Never  Vaping Use   Vaping  Use: Never used  Substance Use Topics   Alcohol use: No    Alcohol/week: 0.0 standard drinks    Comment: 06/08/2013 "quit 02/2007; drank my share before that though"   Drug use: No    Family History Family History  Problem Relation Age of Onset   Hyperlipidemia Mother    Heart disease Mother    Diabetes Mother    Cancer Father        Liver    Allergies  Allergies  Allergen Reactions   Clindamycin/Lincomycin Diarrhea     Current Outpatient Medications  Medication Sig Dispense Refill   acetaminophen (TYLENOL) 500 MG tablet Take 500 mg by mouth every 6 (six) hours as needed for headache.     albuterol (VENTOLIN HFA) 108 (90 Base) MCG/ACT inhaler Inhale 2 puffs into the lungs every 6 (six) hours as needed for wheezing or shortness of breath. 18 g 3   aspirin EC 81 MG EC tablet Take 1 tablet (81 mg total) by mouth daily at 6 (six) AM. Swallow whole.     cephALEXin (KEFLEX) 500 MG capsule Take 1 capsule (500 mg total) by mouth 2 (two) times daily. 14 capsule 0   clopidogrel (PLAVIX) 75 MG tablet Take 75 mg by mouth daily.     ipratropium-albuterol (DUONEB) 0.5-2.5 (3) MG/3ML SOLN TAKE THREE MLS VIA NEBULIZER EVERY 6 HOURS AS NEEDED (Patient taking differently: Inhale 3 mLs into the lungs every 6 (six) hours as needed (shortness of breath).) 360 mL 0   naloxone (NARCAN) nasal spray 4 mg/0.1 mL SMARTSIG:In Nostril     oxyCODONE (ROXICODONE) 15 MG immediate release tablet Take 1 tablet (15 mg total) by mouth every 6 (six) hours as needed for pain. 20 tablet 0   rosuvastatin (CRESTOR) 20 MG tablet Take 1 tablet (20 mg total) by mouth daily. 30 tablet 11   sertraline (ZOLOFT) 25 MG tablet Take 1 tablet (25 mg total) by mouth daily. 90 tablet 2   No current facility-administered medications for this visit.    Physical Examination  Vitals:   09/17/21 1150  BP: (!) 144/75  Pulse: 69  Resp: 16  Temp: (!) 97.2 F (36.2 C)  TempSrc: Temporal  SpO2: 99%  Weight: 152 lb (68.9 kg)   Height: 5\' 7"  (1.702 m)    Body mass index is 23.81 kg/m.  General:  Alert and oriented, no acute distress HEENT: Normal Neck: No bruit or JVD Pulmonary: Clear with decreased breath sounds to auscultation bilaterally Cardiac: Regular Rate and Rhythm without murmur Abdomen: Soft, non-tender,  non-distended, no mass, no scars Skin: No rash         Extremity Pulses:   non palpable pedal pulses.  Doppler signal left peroneal and right PT both monophasic.   Musculoskeletal: No deformity or edema  Neurologic: Upper and lower extremity motor grossly intact and symmetric     ASSESSMENT/PLAN:  This is a 76 y.o. male who is s/p left lower extremity bypass excision, left greater saphenous vein harvest, left anterior tibial vein patch on 07/05/21 by Dr. Karin Lieu due to frank left LE residual PTFE bypass infection which was chronically occluded.    Minimal left leg edema and all incisions have healed.  He is ambulatory for short distances with main issue being COPD with 24 hour O2 dependence.    He will f/u in 6 months for surveillance studies.  If he has concerns he will call sooner than later.        Mosetta Pigeon PA-C [facility] [place] Office: [phone]  MD on call Chestine Spore

## 2021-09-19 DIAGNOSIS — E785 Hyperlipidemia, unspecified: Secondary | ICD-10-CM | POA: Diagnosis not present

## 2021-09-19 DIAGNOSIS — Z48812 Encounter for surgical aftercare following surgery on the circulatory system: Secondary | ICD-10-CM | POA: Diagnosis not present

## 2021-09-19 DIAGNOSIS — Z7982 Long term (current) use of aspirin: Secondary | ICD-10-CM | POA: Diagnosis not present

## 2021-09-19 DIAGNOSIS — I739 Peripheral vascular disease, unspecified: Secondary | ICD-10-CM | POA: Diagnosis not present

## 2021-09-19 DIAGNOSIS — S8992XD Unspecified injury of left lower leg, subsequent encounter: Secondary | ICD-10-CM | POA: Diagnosis not present

## 2021-09-19 DIAGNOSIS — J449 Chronic obstructive pulmonary disease, unspecified: Secondary | ICD-10-CM | POA: Diagnosis not present

## 2021-09-19 DIAGNOSIS — T829XXD Unspecified complication of cardiac and vascular prosthetic device, implant and graft, subsequent encounter: Secondary | ICD-10-CM | POA: Diagnosis not present

## 2021-09-19 DIAGNOSIS — K5900 Constipation, unspecified: Secondary | ICD-10-CM | POA: Diagnosis not present

## 2021-09-19 DIAGNOSIS — I10 Essential (primary) hypertension: Secondary | ICD-10-CM | POA: Diagnosis not present

## 2021-09-19 DIAGNOSIS — I998 Other disorder of circulatory system: Secondary | ICD-10-CM | POA: Diagnosis not present

## 2021-09-20 ENCOUNTER — Other Ambulatory Visit: Payer: Self-pay | Admitting: *Deleted

## 2021-09-20 DIAGNOSIS — I779 Disorder of arteries and arterioles, unspecified: Secondary | ICD-10-CM

## 2021-09-26 DIAGNOSIS — K5900 Constipation, unspecified: Secondary | ICD-10-CM | POA: Diagnosis not present

## 2021-09-26 DIAGNOSIS — Z7982 Long term (current) use of aspirin: Secondary | ICD-10-CM | POA: Diagnosis not present

## 2021-09-26 DIAGNOSIS — Z48812 Encounter for surgical aftercare following surgery on the circulatory system: Secondary | ICD-10-CM | POA: Diagnosis not present

## 2021-09-26 DIAGNOSIS — I10 Essential (primary) hypertension: Secondary | ICD-10-CM | POA: Diagnosis not present

## 2021-09-26 DIAGNOSIS — T829XXD Unspecified complication of cardiac and vascular prosthetic device, implant and graft, subsequent encounter: Secondary | ICD-10-CM | POA: Diagnosis not present

## 2021-09-26 DIAGNOSIS — I998 Other disorder of circulatory system: Secondary | ICD-10-CM | POA: Diagnosis not present

## 2021-09-26 DIAGNOSIS — J441 Chronic obstructive pulmonary disease with (acute) exacerbation: Secondary | ICD-10-CM | POA: Diagnosis not present

## 2021-09-26 DIAGNOSIS — J449 Chronic obstructive pulmonary disease, unspecified: Secondary | ICD-10-CM | POA: Diagnosis not present

## 2021-09-26 DIAGNOSIS — I739 Peripheral vascular disease, unspecified: Secondary | ICD-10-CM | POA: Diagnosis not present

## 2021-09-26 DIAGNOSIS — J9621 Acute and chronic respiratory failure with hypoxia: Secondary | ICD-10-CM | POA: Diagnosis not present

## 2021-09-26 DIAGNOSIS — E785 Hyperlipidemia, unspecified: Secondary | ICD-10-CM | POA: Diagnosis not present

## 2021-09-26 DIAGNOSIS — S8992XD Unspecified injury of left lower leg, subsequent encounter: Secondary | ICD-10-CM | POA: Diagnosis not present

## 2021-09-27 ENCOUNTER — Telehealth: Payer: Self-pay | Admitting: Pharmacist

## 2021-09-27 NOTE — Progress Notes (Addendum)
° ° °  Chronic Care Management Pharmacy Assistant   Name: RAYGEN DAHM  MRN: [id number] DOB: 1946/03/10   Reason for Encounter: Monthly Medication Coordination Call     Medications: Outpatient Encounter Medications as of 09/27/2021  Medication Sig   acetaminophen (TYLENOL) 500 MG tablet Take 500 mg by mouth every 6 (six) hours as needed for headache.   albuterol (VENTOLIN HFA) 108 (90 Base) MCG/ACT inhaler Inhale 2 puffs into the lungs every 6 (six) hours as needed for wheezing or shortness of breath.   aspirin EC 81 MG EC tablet Take 1 tablet (81 mg total) by mouth daily at 6 (six) AM. Swallow whole.   cephALEXin (KEFLEX) 500 MG capsule Take 1 capsule (500 mg total) by mouth 2 (two) times daily.   clopidogrel (PLAVIX) 75 MG tablet Take 75 mg by mouth daily.   ipratropium-albuterol (DUONEB) 0.5-2.5 (3) MG/3ML SOLN TAKE THREE MLS VIA NEBULIZER EVERY 6 HOURS AS NEEDED (Patient taking differently: Inhale 3 mLs into the lungs every 6 (six) hours as needed (shortness of breath).)   naloxone (NARCAN) nasal spray 4 mg/0.1 mL SMARTSIG:In Nostril   oxyCODONE (ROXICODONE) 15 MG immediate release tablet Take 1 tablet (15 mg total) by mouth every 6 (six) hours as needed for pain.   rosuvastatin (CRESTOR) 20 MG tablet Take 1 tablet (20 mg total) by mouth daily.   sertraline (ZOLOFT) 25 MG tablet Take 1 tablet (25 mg total) by mouth daily.   No facility-administered encounter medications on file as of 09/27/2021.    Reviewed chart for medication changes ahead of medication coordination call.  No OVs, Consults, or hospital visits since last care coordination call/Pharmacist visit. (If appropriate, list visit date, provider name)  No medication changes indicated OR if recent visit, treatment plan here.  BP Readings from Last 3 Encounters:  09/17/21 (!) 144/75  08/22/21 (!) 143/85  07/12/21 122/73    Lab Results  Component Value Date   HGBA1C 5.8 (H) 07/06/2021     Patient obtains medications  through Vials  90 Days   Last adherence delivery included: (medication name and frequency)  Sertraline 25 mg 1 tablet daily Clopidogrel 75 mg 1 tablet daily  Docusate 100 mg 1 capsule daily Cliostazol 100 mg 1 tablet twice daily   Patient is due for next adherence delivery on: 10/10/21. Called patient and reviewed medications and coordinated delivery.   This delivery to include:  Sertraline 25 mg 1 tablet daily Clopidogrel 75 mg 1 tablet daily  Docusate 100 mg 1 capsule daily Cliostazol 100 mg 1 tablet twice daily   Patient needs refills for None.  Confirmed delivery date of 10/10/21, advised patient that pharmacy will contact them the morning of delivery.   Care Gaps   AWV: overdue Colonoscopy: unknown DM Eye Exam: N/A DM Foot Exam: N/A Microalbumin: N/A HbgAIC: N/A DEXA: N/A Mammogram: N/A  Star Rating Drugs: Rosuvastatin (CRESTOR) 20 MG tablet - last filled 09/16/21 90 days   No future appointments.   Eugenie Filler, CCMA Clinical Pharmacist Assistant  [phone]  Multiple attempts were made to contact patient. Attempts were unsuccessful. / ls,CMA   30 minutes spent in review, coordination, and documentation.  I was able to reach patient.  He is no longer taking Cilostazol since hospital stay. It is no longer in his medication list. He is also taking Crestor but [facility]  We will have this transferred over for next delivery.  Reviewed by: Willa Frater, PharmD Clinical Pharmacist [phone]

## 2021-10-03 ENCOUNTER — Telehealth: Payer: Self-pay | Admitting: Pharmacist

## 2021-10-03 DIAGNOSIS — K5900 Constipation, unspecified: Secondary | ICD-10-CM | POA: Diagnosis not present

## 2021-10-03 DIAGNOSIS — J449 Chronic obstructive pulmonary disease, unspecified: Secondary | ICD-10-CM | POA: Diagnosis not present

## 2021-10-03 DIAGNOSIS — S8992XD Unspecified injury of left lower leg, subsequent encounter: Secondary | ICD-10-CM | POA: Diagnosis not present

## 2021-10-03 DIAGNOSIS — T829XXD Unspecified complication of cardiac and vascular prosthetic device, implant and graft, subsequent encounter: Secondary | ICD-10-CM | POA: Diagnosis not present

## 2021-10-03 DIAGNOSIS — I10 Essential (primary) hypertension: Secondary | ICD-10-CM | POA: Diagnosis not present

## 2021-10-03 DIAGNOSIS — I739 Peripheral vascular disease, unspecified: Secondary | ICD-10-CM | POA: Diagnosis not present

## 2021-10-03 DIAGNOSIS — I998 Other disorder of circulatory system: Secondary | ICD-10-CM | POA: Diagnosis not present

## 2021-10-03 DIAGNOSIS — Z48812 Encounter for surgical aftercare following surgery on the circulatory system: Secondary | ICD-10-CM | POA: Diagnosis not present

## 2021-10-03 DIAGNOSIS — Z7982 Long term (current) use of aspirin: Secondary | ICD-10-CM | POA: Diagnosis not present

## 2021-10-03 DIAGNOSIS — E785 Hyperlipidemia, unspecified: Secondary | ICD-10-CM | POA: Diagnosis not present

## 2021-10-03 NOTE — Progress Notes (Signed)
° ° °  Chronic Care Management Pharmacy Assistant   Name: Christopher Pena  MRN: [id number] DOB: 24-May-1946    Reason for Encounter: C[facility].    I contacted [facility] on behalf of patient to have his Crestor 20 mg rx transferred to [facility]. Per pharmacist patient did not have any refills left on this rx and last refill was used when he last filled rx in Jan. Notified CPP and a request is being sent to prescriber for future refills to be sent to [facility] for patient's next delivery.  Eugenie Filler, [facility] Clinical Pharmacist Assistant  [phone]

## 2021-10-09 DIAGNOSIS — M5136 Other intervertebral disc degeneration, lumbar region: Secondary | ICD-10-CM | POA: Diagnosis not present

## 2021-10-09 DIAGNOSIS — K5900 Constipation, unspecified: Secondary | ICD-10-CM | POA: Diagnosis not present

## 2021-10-09 DIAGNOSIS — G890 Central pain syndrome: Secondary | ICD-10-CM | POA: Diagnosis not present

## 2021-10-09 DIAGNOSIS — M5432 Sciatica, left side: Secondary | ICD-10-CM | POA: Diagnosis not present

## 2021-10-09 DIAGNOSIS — E785 Hyperlipidemia, unspecified: Secondary | ICD-10-CM | POA: Diagnosis not present

## 2021-10-09 DIAGNOSIS — S8992XD Unspecified injury of left lower leg, subsequent encounter: Secondary | ICD-10-CM | POA: Diagnosis not present

## 2021-10-09 DIAGNOSIS — M79604 Pain in right leg: Secondary | ICD-10-CM | POA: Diagnosis not present

## 2021-10-09 DIAGNOSIS — G894 Chronic pain syndrome: Secondary | ICD-10-CM | POA: Diagnosis not present

## 2021-10-09 DIAGNOSIS — Z48812 Encounter for surgical aftercare following surgery on the circulatory system: Secondary | ICD-10-CM | POA: Diagnosis not present

## 2021-10-09 DIAGNOSIS — Z79891 Long term (current) use of opiate analgesic: Secondary | ICD-10-CM | POA: Diagnosis not present

## 2021-10-09 DIAGNOSIS — J449 Chronic obstructive pulmonary disease, unspecified: Secondary | ICD-10-CM | POA: Diagnosis not present

## 2021-10-09 DIAGNOSIS — I10 Essential (primary) hypertension: Secondary | ICD-10-CM | POA: Diagnosis not present

## 2021-10-09 DIAGNOSIS — M79605 Pain in left leg: Secondary | ICD-10-CM | POA: Diagnosis not present

## 2021-10-09 DIAGNOSIS — I998 Other disorder of circulatory system: Secondary | ICD-10-CM | POA: Diagnosis not present

## 2021-10-09 DIAGNOSIS — I739 Peripheral vascular disease, unspecified: Secondary | ICD-10-CM | POA: Diagnosis not present

## 2021-10-09 DIAGNOSIS — T829XXD Unspecified complication of cardiac and vascular prosthetic device, implant and graft, subsequent encounter: Secondary | ICD-10-CM | POA: Diagnosis not present

## 2021-10-09 DIAGNOSIS — M545 Low back pain, unspecified: Secondary | ICD-10-CM | POA: Diagnosis not present

## 2021-10-09 DIAGNOSIS — Z7982 Long term (current) use of aspirin: Secondary | ICD-10-CM | POA: Diagnosis not present

## 2021-10-09 DIAGNOSIS — M5431 Sciatica, right side: Secondary | ICD-10-CM | POA: Diagnosis not present

## 2021-10-24 DIAGNOSIS — J9621 Acute and chronic respiratory failure with hypoxia: Secondary | ICD-10-CM | POA: Diagnosis not present

## 2021-10-24 DIAGNOSIS — J441 Chronic obstructive pulmonary disease with (acute) exacerbation: Secondary | ICD-10-CM | POA: Diagnosis not present

## 2021-11-06 DIAGNOSIS — M545 Low back pain, unspecified: Secondary | ICD-10-CM | POA: Diagnosis not present

## 2021-11-06 DIAGNOSIS — M5136 Other intervertebral disc degeneration, lumbar region: Secondary | ICD-10-CM | POA: Diagnosis not present

## 2021-11-06 DIAGNOSIS — M5431 Sciatica, right side: Secondary | ICD-10-CM | POA: Diagnosis not present

## 2021-11-06 DIAGNOSIS — M5432 Sciatica, left side: Secondary | ICD-10-CM | POA: Diagnosis not present

## 2021-11-06 DIAGNOSIS — G894 Chronic pain syndrome: Secondary | ICD-10-CM | POA: Diagnosis not present

## 2021-11-06 DIAGNOSIS — M79605 Pain in left leg: Secondary | ICD-10-CM | POA: Diagnosis not present

## 2021-11-06 DIAGNOSIS — Z79891 Long term (current) use of opiate analgesic: Secondary | ICD-10-CM | POA: Diagnosis not present

## 2021-11-06 DIAGNOSIS — M79604 Pain in right leg: Secondary | ICD-10-CM | POA: Diagnosis not present

## 2021-11-06 DIAGNOSIS — G890 Central pain syndrome: Secondary | ICD-10-CM | POA: Diagnosis not present

## 2021-11-08 ENCOUNTER — Other Ambulatory Visit: Payer: Self-pay | Admitting: Internal Medicine

## 2021-11-18 ENCOUNTER — Encounter: Payer: Self-pay | Admitting: Family Medicine

## 2021-11-24 DIAGNOSIS — J441 Chronic obstructive pulmonary disease with (acute) exacerbation: Secondary | ICD-10-CM | POA: Diagnosis not present

## 2021-11-24 DIAGNOSIS — J9621 Acute and chronic respiratory failure with hypoxia: Secondary | ICD-10-CM | POA: Diagnosis not present

## 2021-12-04 DIAGNOSIS — M5136 Other intervertebral disc degeneration, lumbar region: Secondary | ICD-10-CM | POA: Diagnosis not present

## 2021-12-04 DIAGNOSIS — M79604 Pain in right leg: Secondary | ICD-10-CM | POA: Diagnosis not present

## 2021-12-04 DIAGNOSIS — M545 Low back pain, unspecified: Secondary | ICD-10-CM | POA: Diagnosis not present

## 2021-12-04 DIAGNOSIS — G894 Chronic pain syndrome: Secondary | ICD-10-CM | POA: Diagnosis not present

## 2021-12-04 DIAGNOSIS — M5432 Sciatica, left side: Secondary | ICD-10-CM | POA: Diagnosis not present

## 2021-12-04 DIAGNOSIS — M5137 Other intervertebral disc degeneration, lumbosacral region: Secondary | ICD-10-CM | POA: Diagnosis not present

## 2021-12-04 DIAGNOSIS — M5431 Sciatica, right side: Secondary | ICD-10-CM | POA: Diagnosis not present

## 2021-12-04 DIAGNOSIS — Z79891 Long term (current) use of opiate analgesic: Secondary | ICD-10-CM | POA: Diagnosis not present

## 2021-12-04 DIAGNOSIS — M79605 Pain in left leg: Secondary | ICD-10-CM | POA: Diagnosis not present

## 2021-12-20 ENCOUNTER — Telehealth: Payer: Self-pay | Admitting: Internal Medicine

## 2021-12-20 ENCOUNTER — Telehealth: Payer: Self-pay | Admitting: Pharmacist

## 2021-12-20 NOTE — Telephone Encounter (Signed)
Called and spoke with pt letting him know that his med refill request was denied due to him not being seen since 2021 and he verbalized understanding. Pt said he would call PCP about seeing if they would be okay refilling med for him. Nothing further needed. ?

## 2021-12-20 NOTE — Progress Notes (Deleted)
? ? ?Chronic Care Management ?Pharmacy Assistant  ? ?Name: Christopher Pena  MRN: [id number] DOB: 1946/02/12 ? ? ?Reason for Encounter: Disease State - General Adherence Call  ?  ? ? ?Recent office visits:  ?None noted.  ? ?Recent consult visits:  ?None noted.  ? ?Hospital visits: 07/05/21 - 07/12/21 ?Medication Reconciliation was completed by comparing discharge summary, patient?s EMR and Pharmacy list, and upon discussion with patient. ?  ?Admitted to the hospital on 07/05/21 due to Foreign body in left leg. Discharge date was 07/12/21. Discharged from [facility].   ?  ?New?Medications Started at [facility] Discharge:?? ?amoxicillin-clavulanate (AUGMENTIN) 875-125 MG tablet Take 1 tablet by mouth 2 (two) times daily for 10 days ?  ?Medication Changes at Hospital Discharge: ?oxyCODONE (ROXICODONE) 15 MG immediate release tablet ?  ?Medications Discontinued at Hospital Discharge: ?Cilostazol ?  ?Medications that remain the same after Hospital Discharge:??  ?All other medications will remain the same.   ? ?Medications: ?Outpatient Encounter Medications as of 12/20/2021  ?Medication Sig  ? acetaminophen (TYLENOL) 500 MG tablet Take 500 mg by mouth every 6 (six) hours as needed for headache.  ? albuterol (VENTOLIN HFA) 108 (90 Base) MCG/ACT inhaler Inhale 2 puffs into the lungs every 6 (six) hours as needed for wheezing or shortness of breath.  ? aspirin EC 81 MG EC tablet Take 1 tablet (81 mg total) by mouth daily at 6 (six) AM. Swallow whole.  ? cephALEXin (KEFLEX) 500 MG capsule Take 1 capsule (500 mg total) by mouth 2 (two) times daily.  ? clopidogrel (PLAVIX) 75 MG tablet Take 75 mg by mouth daily.  ? ipratropium-albuterol (DUONEB) 0.5-2.5 (3) MG/3ML SOLN TAKE THREE MLS VIA NEBULIZER EVERY 6 HOURS AS NEEDED (Patient taking differently: Inhale 3 mLs into the lungs every 6 (six) hours as needed (shortness of breath).)  ? naloxone (NARCAN) nasal spray 4 mg/0.1 mL SMARTSIG:In Nostril  ? oxyCODONE (ROXICODONE) 15 MG  immediate release tablet Take 1 tablet (15 mg total) by mouth every 6 (six) hours as needed for pain.  ? rosuvastatin (CRESTOR) 20 MG tablet Take 1 tablet (20 mg total) by mouth daily.  ? sertraline (ZOLOFT) 25 MG tablet Take 1 tablet (25 mg total) by mouth daily.  ? ?No facility-administered encounter medications on file as of 12/20/2021.  ? ? ?Contacted Christopher Pena for General Review Call ? ? ?Chart Review: ? ?Have there been any documented new, changed, or discontinued medications since last visit? No (If yes, include name, dose, frequency, date) ?Has there been any documented recent hospitalizations or ED visits since last visit with Clinical Pharmacist? No ?Brief Summary (including medication and/or Diagnosis changes): ? ? ?Adherence Review: ? ?Does the Clinical Pharmacist Assistant have access to adherence rates? Yes ?Adherence rates for STAR metric medications (List medication(s)/day supply/ last 2 fill dates). ?Adherence rates for medications indicated for disease state being reviewed (List medication(s)/day supply/ last 2 fill dates). ?Does the patient have >5 day gap between last estimated fill dates for any of the above medications or other medication gaps? No ?Reason for medication gaps. ? ? ?Disease State Questions: ? ?Able to connect with Patient? {yes/no:[id number]} ?Did patient have any problems with their health recently? No ?Note problems and Concerns: ?Have you had any admissions or emergency room visits or worsening of your condition(s) since last visit? No ?Details of ED visit, hospital visit and/or worsening condition(s): ?Have you had any visits with new specialists or providers since your last visit? No ?Explain: ?Have you  had any new health care problem(s) since your last visit? No ?New problem(s) reported: ?Have you run out of any of your medications since you last spoke with clinical pharmacist? No ?What caused you to run out of your medications? ?Are there any medications you are not taking  as prescribed? No ?What kept you from taking your medications as prescribed? ?Are you having any issues or side effects with your medications? No ?Note of issues or side effects: ?Do you have any other health concerns or questions you want to discuss with your Clinical Pharmacist before your next visit? No ?Note additional concerns and questions from Patient. ?Are there any health concerns that you feel we can do a better job addressing? No ?Note Patient's response. ?Are you having any problems with any of the following since the last visit: (select all that apply) ? None ? Details: ?12. Any falls since last visit? No ? Details: ?13. Any increased or uncontrolled pain since last visit? No ? Details: ?14. Next visit Type: None scheduled  ?      Visit with: ?       Date: ?       Time: ? ?11. Additional Details? No  ? ? ?Care Gaps ?  ?AWV: overdue ?Colonoscopy: unknown ?DM Eye Exam: N/A ?DM Foot Exam: N/A ?Microalbumin: N/A ?HbgAIC: N/A ?DEXA: N/A ?Mammogram: N/A ?  ?  ?Star Rating Drugs: ?Rosuvastatin (CRESTOR) 20 MG tablet - last filled 09/16/21 90 days  ? ?No future appointments. ? ? ?Christopher Pena, CCMA ?Clinical Pharmacist Assistant  ?[phone] ? ? ?

## 2021-12-27 ENCOUNTER — Other Ambulatory Visit: Payer: Self-pay | Admitting: Physician Assistant

## 2021-12-28 ENCOUNTER — Telehealth: Payer: Self-pay | Admitting: Pharmacist

## 2021-12-28 NOTE — Progress Notes (Signed)
? ? ?  Chronic Care Management ?Pharmacy Assistant  ? ?Name: Christopher Pena  MRN: [id number] DOB: February 14, 1946 ? ? ?Reason for Encounter: Monthly Medication Coordination Call  ?  ? ? ?Medications: ?Outpatient Encounter Medications as of 12/28/2021  ?Medication Sig  ? acetaminophen (TYLENOL) 500 MG tablet Take 500 mg by mouth every 6 (six) hours as needed for headache.  ? albuterol (VENTOLIN HFA) 108 (90 Base) MCG/ACT inhaler Inhale 2 puffs into the lungs every 6 (six) hours as needed for wheezing or shortness of breath.  ? aspirin EC 81 MG EC tablet Take 1 tablet (81 mg total) by mouth daily at 6 (six) AM. Swallow whole.  ? cephALEXin (KEFLEX) 500 MG capsule Take 1 capsule (500 mg total) by mouth 2 (two) times daily.  ? clopidogrel (PLAVIX) 75 MG tablet Take 75 mg by mouth daily.  ? ipratropium-albuterol (DUONEB) 0.5-2.5 (3) MG/3ML SOLN TAKE THREE MLS VIA NEBULIZER EVERY 6 HOURS AS NEEDED (Patient taking differently: Inhale 3 mLs into the lungs every 6 (six) hours as needed (shortness of breath).)  ? naloxone (NARCAN) nasal spray 4 mg/0.1 mL SMARTSIG:In Nostril  ? oxyCODONE (ROXICODONE) 15 MG immediate release tablet Take 1 tablet (15 mg total) by mouth every 6 (six) hours as needed for pain.  ? rosuvastatin (CRESTOR) 20 MG tablet TAKE 1 TABLET BY MOUTH EVERY DAY  ? sertraline (ZOLOFT) 25 MG tablet Take 1 tablet (25 mg total) by mouth daily.  ? ?No facility-administered encounter medications on file as of 12/28/2021.  ? ? ?Reviewed chart for medication changes ahead of medication coordination call. ? ?No OVs, Consults, or hospital visits since last care coordination call/Pharmacist visit. (If appropriate, list visit date, provider name) ? ?No medication changes indicated OR if recent visit, treatment plan here. ? ?BP Readings from Last 3 Encounters:  ?09/17/21 (!) 144/75  ?08/22/21 (!) 143/85  ?07/12/21 122/73  ?  ?Lab Results  ?Component Value Date  ? HGBA1C 5.8 (H) 07/06/2021  ?  ? ?Patient obtains medications through  Vials w Easy open caps 90 Days  ? ?Last adherence delivery included: (medication name and frequency) ? ?Sertraline 25 mg 1 tablet daily ?Clopidogrel 75 mg 1 tablet daily  ?Docusate 100 mg 1 capsule daily ?Cliostazol 100 mg 1 tablet twice daily ? ?Patient is due for next adherence delivery on: 01/08/22. ?Called patient and reviewed medications and coordinated delivery. ? ?This delivery to include: ? ?Sertraline 25 mg 1 tablet daily ?Clopidogrel 75 mg 1 tablet daily  ?Docusate 100 mg 1 capsule daily ? ?Declined ?Rosuvastatin 20 mg 1 tablet daily  - got filled last week at [facility] ? ?Patient declined the following medications (meds) due to (reason) ? ?Patient needs refills for: None. ? ?Confirmed delivery date of 01/08/22, advised patient that pharmacy will contact them the morning of delivery. ? ? ?Care Gaps ?  ?AWV: overdue ?Colonoscopy: unknown ?DM Eye Exam: N/A ?DM Foot Exam: N/A ?Microalbumin: N/A ?HbgAIC: N/A ?DEXA: N/A ?Mammogram: N/A ?  ?Star Rating Drugs: ?Rosuvastatin (CRESTOR) 20 MG tablet - last filled 09/16/21 90 days  ? ? ? ?No future appointments. ? ? ?Liza Showfety, CCMA ?Clinical Pharmacist Assistant  ?[phone] ? ?

## 2021-12-28 NOTE — Progress Notes (Signed)
Duplicate

## 2022-01-01 DIAGNOSIS — M5432 Sciatica, left side: Secondary | ICD-10-CM | POA: Diagnosis not present

## 2022-01-01 DIAGNOSIS — G890 Central pain syndrome: Secondary | ICD-10-CM | POA: Diagnosis not present

## 2022-01-01 DIAGNOSIS — G894 Chronic pain syndrome: Secondary | ICD-10-CM | POA: Diagnosis not present

## 2022-01-01 DIAGNOSIS — M5136 Other intervertebral disc degeneration, lumbar region: Secondary | ICD-10-CM | POA: Diagnosis not present

## 2022-01-01 DIAGNOSIS — M79605 Pain in left leg: Secondary | ICD-10-CM | POA: Diagnosis not present

## 2022-01-01 DIAGNOSIS — M79604 Pain in right leg: Secondary | ICD-10-CM | POA: Diagnosis not present

## 2022-01-01 DIAGNOSIS — Z79891 Long term (current) use of opiate analgesic: Secondary | ICD-10-CM | POA: Diagnosis not present

## 2022-01-01 DIAGNOSIS — M545 Low back pain, unspecified: Secondary | ICD-10-CM | POA: Diagnosis not present

## 2022-01-01 DIAGNOSIS — M5431 Sciatica, right side: Secondary | ICD-10-CM | POA: Diagnosis not present

## 2022-01-02 ENCOUNTER — Ambulatory Visit (INDEPENDENT_AMBULATORY_CARE_PROVIDER_SITE_OTHER): Payer: Medicare Other | Admitting: Family Medicine

## 2022-01-02 ENCOUNTER — Encounter: Payer: Self-pay | Admitting: Family Medicine

## 2022-01-02 VITALS — BP 116/80 | HR 125 | Temp 97.8°F | Resp 17 | Ht 67.0 in | Wt 157.4 lb

## 2022-01-02 DIAGNOSIS — J9611 Chronic respiratory failure with hypoxia: Secondary | ICD-10-CM | POA: Diagnosis not present

## 2022-01-02 DIAGNOSIS — I739 Peripheral vascular disease, unspecified: Secondary | ICD-10-CM | POA: Diagnosis not present

## 2022-01-02 DIAGNOSIS — F419 Anxiety disorder, unspecified: Secondary | ICD-10-CM | POA: Diagnosis not present

## 2022-01-02 DIAGNOSIS — E785 Hyperlipidemia, unspecified: Secondary | ICD-10-CM

## 2022-01-02 LAB — HEPATIC FUNCTION PANEL
ALT: 25 U/L (ref 0–53)
AST: 37 U/L (ref 0–37)
Albumin: 4.2 g/dL (ref 3.5–5.2)
Alkaline Phosphatase: 88 U/L (ref 39–117)
Bilirubin, Direct: 0.1 mg/dL (ref 0.0–0.3)
Total Bilirubin: 0.3 mg/dL (ref 0.2–1.2)
Total Protein: 8 g/dL (ref 6.0–8.3)

## 2022-01-02 LAB — CBC WITH DIFFERENTIAL/PLATELET
Basophils Absolute: 0.2 10*3/uL — ABNORMAL HIGH (ref 0.0–0.1)
Basophils Relative: 1.2 % (ref 0.0–3.0)
Eosinophils Absolute: 0.3 10*3/uL (ref 0.0–0.7)
Eosinophils Relative: 2.3 % (ref 0.0–5.0)
HCT: 40.1 % (ref 39.0–52.0)
Hemoglobin: 12.6 g/dL — ABNORMAL LOW (ref 13.0–17.0)
Lymphocytes Relative: 7.2 % — ABNORMAL LOW (ref 12.0–46.0)
Lymphs Abs: 1 10*3/uL (ref 0.7–4.0)
MCHC: 31.5 g/dL (ref 30.0–36.0)
MCV: 84.2 fl (ref 78.0–100.0)
Monocytes Absolute: 1.1 10*3/uL — ABNORMAL HIGH (ref 0.1–1.0)
Monocytes Relative: 8.4 % (ref 3.0–12.0)
Neutro Abs: 11 10*3/uL — ABNORMAL HIGH (ref 1.4–7.7)
Neutrophils Relative %: 80.9 % — ABNORMAL HIGH (ref 43.0–77.0)
Platelets: 288 10*3/uL (ref 150.0–400.0)
RBC: 4.76 Mil/uL (ref 4.22–5.81)
RDW: 17.3 % — ABNORMAL HIGH (ref 11.5–15.5)
WBC: 13.6 10*3/uL — ABNORMAL HIGH (ref 4.0–10.5)

## 2022-01-02 LAB — BASIC METABOLIC PANEL
BUN: 13 mg/dL (ref 6–23)
CO2: 30 mEq/L (ref 19–32)
Calcium: 9 mg/dL (ref 8.4–10.5)
Chloride: 100 mEq/L (ref 96–112)
Creatinine, Ser: 0.89 mg/dL (ref 0.40–1.50)
GFR: 83.8 mL/min (ref >60.00)
Glucose, Bld: 156 mg/dL — ABNORMAL HIGH (ref 70–99)
Potassium: 4.2 mEq/L (ref 3.5–5.1)
Sodium: 139 mEq/L (ref 135–145)

## 2022-01-02 LAB — LIPID PANEL
Cholesterol: 136 mg/dL (ref 0–200)
HDL: 79.1 mg/dL (ref >39.00)
LDL Cholesterol: 42 mg/dL (ref 0–99)
NonHDL: 57.34
Total CHOL/HDL Ratio: 2
Triglycerides: 76 mg/dL (ref 0.0–149.0)
VLDL: 15.2 mg/dL (ref 0.0–40.0)

## 2022-01-02 LAB — TSH: TSH: 1.43 u[IU]/mL (ref 0.35–5.50)

## 2022-01-02 MED ORDER — CLOPIDOGREL BISULFATE 75 MG PO TABS: 75.0000 mg | ORAL_TABLET | Freq: Every day | ORAL | 1 refills | Status: DC

## 2022-01-02 MED ORDER — ALBUTEROL SULFATE HFA 108 (90 BASE) MCG/ACT IN AERS: 2.0000 | INHALATION_SPRAY | Freq: Four times a day (QID) | RESPIRATORY_TRACT | 6 refills | Status: DC | PRN

## 2022-01-02 MED ORDER — BUSPIRONE HCL 7.5 MG PO TABS: 7.5000 mg | ORAL_TABLET | Freq: Two times a day (BID) | ORAL | 1 refills | Status: DC

## 2022-01-02 MED ORDER — SERTRALINE HCL 25 MG PO TABS: 25.0000 mg | ORAL_TABLET | Freq: Every day | ORAL | 3 refills | Status: DC

## 2022-01-02 NOTE — Assessment & Plan Note (Signed)
Chronic problem.  Follows w/ Vascular.  Needs refills on Plavix.  Prescription sent. ?

## 2022-01-02 NOTE — Patient Instructions (Addendum)
Schedule your complete physical in 6 months ?We'll notify you of your lab results and make any changes if needed ?[facility] will call you to set up things ?RESTART the Buspirone twice daily ?Call with any questions or concerns ?Have a great summer!!! ?

## 2022-01-02 NOTE — Assessment & Plan Note (Signed)
Ongoing issue for pt.  Currently on Sertraline 25mg  daily.  Would like to restart Buspar 7.5mg  BID.  He has been out of this for ~1 yr and has noticed the difference.  Prescription sent. ?

## 2022-01-02 NOTE — Assessment & Plan Note (Signed)
Chronic problem.  Tolerating Crestor 20mg daily w/o difficulty.  Check labs.  Adjust meds prn  

## 2022-01-02 NOTE — Assessment & Plan Note (Signed)
Chronic problem.  Pt has been seeing Dr Sherene Sires but not in the last year.  He is now on 2-3L via [place] at all times.  He is unhappy w/ his current O2/DME provider ([facility]) and would like to switch to [facility].  Referral placed.  Also refilled albuterol HFA.  Pt reports sxs are severe but stable at this time. ?

## 2022-01-02 NOTE — Progress Notes (Signed)
? ?  Subjective:  ? ? Patient ID: Christopher Pena, male    DOB: [birth year], 76 y.o.   MRN: [id number] ? ?HPI ?Hyperlipidemia- chronic problem, on Crestor 20mg  daily.  No CP, abd pain, N/V ? ?Anxiety- pt is on Sertraline 25mg  daily.  Was previously on Buspar 7.5mg  BID and would like to restart ? ?PAD- chronic problem.  Pt follows w/ Vascular.  Has femoral artery stents and LLE bypass grafts.  Needs refill on Plavix.  No LE edema. ? ?COPD- chronic problem, follows w/ Pulmonary.  Now wearing O2 at all times.  Pt currently uses [facility] for home O2.  Would like to switch to [facility].  Uses [facility] prn.  Needs Albuterol inhaler refill.  Pt feels SOB is stable ? ?Review of Systems ?For ROS see HPI  ? ?   ?Objective:  ? Physical Exam ?Vitals reviewed.  ?Constitutional:   ?   General: He is not in acute distress. ?   Appearance: He is well-developed.  ?   Comments: O2 in place via [place]  ?HENT:  ?   Head: Normocephalic and atraumatic.  ?Eyes:  ?   Extraocular Movements: Extraocular movements intact.  ?   Conjunctiva/sclera: Conjunctivae normal.  ?   Pupils: Pupils are equal, round, and reactive to light.  ?Neck:  ?   Thyroid: No thyromegaly.  ?Cardiovascular:  ?   Rate and Rhythm: Normal rate and regular rhythm.  ?   Heart sounds: No murmur heard. ?   Comments: Decreased DP pulses bilaterally ?Faint, distant heart sounds ?Pulmonary:  ?   Effort: Pulmonary effort is normal. No respiratory distress.  ?   Breath sounds: Wheezing (diffuse wheezing) present.  ?Abdominal:  ?   General: Bowel sounds are normal. There is no distension.  ?   Palpations: Abdomen is soft.  ?Musculoskeletal:  ?   Cervical back: Normal range of motion and neck supple.  ?   Right lower leg: No edema.  ?   Left lower leg: No edema.  ?Lymphadenopathy:  ?   Cervical: No cervical adenopathy.  ?Skin: ?   General: Skin is warm and dry.  ?Neurological:  ?   General: No focal deficit present.  ?   Mental Status: He is alert and oriented to person, place, and time.  ?    Cranial Nerves: No cranial nerve deficit.  ?Psychiatric:     ?   Mood and Affect: Mood normal.     ?   Behavior: Behavior normal.  ? ? ? ? ? ?   ?Assessment & Plan:  ? ? ?

## 2022-01-03 ENCOUNTER — Telehealth: Payer: Self-pay

## 2022-01-03 MED ORDER — PREDNISONE 10 MG PO TABS: ORAL_TABLET | ORAL | 0 refills | Status: DC

## 2022-01-03 NOTE — Telephone Encounter (Signed)
Prednisone sent in at pt's request to have on hand should he develop SOB

## 2022-01-03 NOTE — Telephone Encounter (Signed)
-----   Message from [name], MD sent at 01/02/2022  8:50 PM EDT ----- Labs look good w/ exception of elevated sugar (but my guess is that you had lunch prior to your appt)  Your white blood cell count is also elevated but this is pretty consistent w/ what it has been.  If you start feeling ill or develop a fever, please let us know!

## 2022-01-03 NOTE — Telephone Encounter (Signed)
Spoke w/ pt and advised of lab results  

## 2022-01-29 DIAGNOSIS — Z79891 Long term (current) use of opiate analgesic: Secondary | ICD-10-CM | POA: Diagnosis not present

## 2022-01-29 DIAGNOSIS — M545 Low back pain, unspecified: Secondary | ICD-10-CM | POA: Diagnosis not present

## 2022-01-29 DIAGNOSIS — M5136 Other intervertebral disc degeneration, lumbar region: Secondary | ICD-10-CM | POA: Diagnosis not present

## 2022-01-29 DIAGNOSIS — G894 Chronic pain syndrome: Secondary | ICD-10-CM | POA: Diagnosis not present

## 2022-01-29 DIAGNOSIS — M5432 Sciatica, left side: Secondary | ICD-10-CM | POA: Diagnosis not present

## 2022-01-29 DIAGNOSIS — M79605 Pain in left leg: Secondary | ICD-10-CM | POA: Diagnosis not present

## 2022-01-29 DIAGNOSIS — M79604 Pain in right leg: Secondary | ICD-10-CM | POA: Diagnosis not present

## 2022-01-29 DIAGNOSIS — G890 Central pain syndrome: Secondary | ICD-10-CM | POA: Diagnosis not present

## 2022-01-29 DIAGNOSIS — M5431 Sciatica, right side: Secondary | ICD-10-CM | POA: Diagnosis not present

## 2022-02-22 ENCOUNTER — Telehealth: Payer: Self-pay | Admitting: Pharmacist

## 2022-02-22 NOTE — Progress Notes (Signed)
Chronic Care Management Pharmacy Assistant   Name: Christopher Pena  MRN: [id number] DOB: [birth year]   Reason for Encounter: Disease State - General Adherence Call    Recent office visits:  01/02/22 Christopher Pena <D - Family Medicine - Anxiety - RESTART the Buspirone twice daily. Follow up in 6 months for physical.  Recent consult visits:  None noted.   Hospital visits:  None in previous 6 months  Medications: Outpatient Encounter Medications as of 02/22/2022  Medication Sig   acetaminophen (TYLENOL) 500 MG tablet Take 500 mg by mouth every 6 (six) hours as needed for headache.   albuterol (VENTOLIN HFA) 108 (90 Base) MCG/ACT inhaler Inhale 2 puffs into the lungs every 6 (six) hours as needed for wheezing or shortness of breath.   albuterol (VENTOLIN HFA) 108 (90 Base) MCG/ACT inhaler Inhale 2 puffs into the lungs every 6 (six) hours as needed for wheezing or shortness of breath.   aspirin EC 81 MG EC tablet Take 1 tablet (81 mg total) by mouth daily at 6 (six) AM. Swallow whole.   busPIRone (BUSPAR) 7.5 MG tablet Take 1 tablet (7.5 mg total) by mouth 2 (two) times daily.   clopidogrel (PLAVIX) 75 MG tablet Take 75 mg by mouth daily.   clopidogrel (PLAVIX) 75 MG tablet Take 1 tablet (75 mg total) by mouth daily.   ipratropium-albuterol (DUONEB) 0.5-2.5 (3) MG/3ML SOLN TAKE THREE MLS VIA NEBULIZER EVERY 6 HOURS AS NEEDED (Patient taking differently: Inhale 3 mLs into the lungs every 6 (six) hours as needed (shortness of breath).)   naloxone (NARCAN) nasal spray 4 mg/0.1 mL SMARTSIG:In Nostril   oxyCODONE (ROXICODONE) 15 MG immediate release tablet Take 1 tablet (15 mg total) by mouth every 6 (six) hours as needed for pain.   predniSONE (DELTASONE) 10 MG tablet 3 tabs x3 days and then 2 tabs x3 days and then 1 tab x3 days.  Take w/ food.   rosuvastatin (CRESTOR) 20 MG tablet TAKE 1 TABLET BY MOUTH EVERY DAY   sertraline (ZOLOFT) 25 MG tablet Take 1 tablet (25 mg total) by mouth  daily.   sertraline (ZOLOFT) 25 MG tablet Take 1 tablet (25 mg total) by mouth daily.   No facility-administered encounter medications on file as of 02/22/2022.    Contacted [name] Dillehay for General Review Call   Chart Review:  Have there been any documented new, changed, or discontinued medications since last visit? No (If yes, include name, dose, frequency, date) Has there been any documented recent hospitalizations or ED visits since last visit with Clinical Pharmacist? No Brief Summary (including medication and/or Diagnosis changes):   Adherence Review:  Does the Clinical Pharmacist Assistant have access to adherence rates? Yes Adherence rates for STAR metric medications (List medication(s)/day supply/ last 2 fill dates). Adherence rates for medications indicated for disease state being reviewed (List medication(s)/day supply/ last 2 fill dates). Does the patient have >5 day gap between last estimated fill dates for any of the above medications or other medication gaps? No Reason for medication gaps.   Disease State Questions:  Able to connect with Patient? Yes Did patient have any problems with their health recently? No Note problems and Concerns: Have you had any admissions or emergency room visits or worsening of your condition(s) since last visit? No Details of ED visit, hospital visit and/or worsening condition(s): Have you had any visits with new specialists or providers since your last visit? No Explain: Have you had any new health care  problem(s) since your last visit? No New problem(s) reported: Have you run out of any of your medications since you last spoke with clinical pharmacist? No What caused you to run out of your medications? Are there any medications you are not taking as prescribed? No What kept you from taking your medications as prescribed? Are you having any issues or side effects with your medications? No Note of issues or side effects: Do you  have any other health concerns or questions you want to discuss with your Clinical Pharmacist before your next visit? No Note additional concerns and questions from Patient. Are there any health concerns that you feel we can do a better job addressing? No Note Patient's response. Are you having any problems with any of the following since the last visit: (select all that apply)  None  Details: 12. Any falls since last visit? No  Details: 13. Any increased or uncontrolled pain since last visit? No  Details: 14. Next visit Type: telephone       Visit with: AWV        Date:02/27/22         Time: 3:15pm  15. Additional Details? No Patient reported he is doing well.    Care Gaps   AWV: scheduled 02/27/22 Colonoscopy: unknown DM Eye Exam: N/A DM Foot Exam: N/A Microalbumin: N/A HbgAIC: N/A DEXA: N/A Mammogram: N/A   Star Rating Drugs: Rosuvastatin (CRESTOR) 20 MG tablet - last filled 12/27/21 90 days    Future Appointments  Date Time Provider Department Center  02/27/2022  3:15 PM [facility]  07/03/2022 12:40 PM Sheliah Hatch, MD [facility]    Eugenie Filler, [facility] Clinical Pharmacist Assistant  [phone]

## 2022-02-26 DIAGNOSIS — M5136 Other intervertebral disc degeneration, lumbar region: Secondary | ICD-10-CM | POA: Diagnosis not present

## 2022-02-26 DIAGNOSIS — M79605 Pain in left leg: Secondary | ICD-10-CM | POA: Diagnosis not present

## 2022-02-26 DIAGNOSIS — M5432 Sciatica, left side: Secondary | ICD-10-CM | POA: Diagnosis not present

## 2022-02-26 DIAGNOSIS — M545 Low back pain, unspecified: Secondary | ICD-10-CM | POA: Diagnosis not present

## 2022-02-26 DIAGNOSIS — G894 Chronic pain syndrome: Secondary | ICD-10-CM | POA: Diagnosis not present

## 2022-02-26 DIAGNOSIS — M5431 Sciatica, right side: Secondary | ICD-10-CM | POA: Diagnosis not present

## 2022-02-26 DIAGNOSIS — G890 Central pain syndrome: Secondary | ICD-10-CM | POA: Diagnosis not present

## 2022-02-26 DIAGNOSIS — M79604 Pain in right leg: Secondary | ICD-10-CM | POA: Diagnosis not present

## 2022-02-26 DIAGNOSIS — Z79891 Long term (current) use of opiate analgesic: Secondary | ICD-10-CM | POA: Diagnosis not present

## 2022-02-27 ENCOUNTER — Ambulatory Visit (INDEPENDENT_AMBULATORY_CARE_PROVIDER_SITE_OTHER): Payer: Medicare Other

## 2022-02-27 DIAGNOSIS — Z0000 Encounter for general adult medical examination without abnormal findings: Secondary | ICD-10-CM

## 2022-02-27 NOTE — Progress Notes (Signed)
Subjective:   Christopher Pena is a 76 y.o. male who presents for an Initial Medicare Annual Wellness Visit.   I connected with Christopher Pena  today by telephone and verified that I am speaking with the correct person using two identifiers. Location patient: home Location provider: work Persons participating in the virtual visit: patient, provider.   I discussed the limitations, risks, security and privacy concerns of performing an evaluation and management service by telephone and the availability of in person appointments. I also discussed with the patient that there may be a patient responsible charge related to this service. The patient expressed understanding and verbally consented to this telephonic visit.    Interactive audio and video telecommunications were attempted between this provider and patient, however failed, due to patient having technical difficulties OR patient did not have access to video capability.  We continued and completed visit with audio only.    Review of Systems     Cardiac Risk Factors include: advanced age (>62men, >102 women);male gender     Objective:    Today's Vitals   There is no height or weight on file to calculate BMI.     02/27/2022    3:29 PM 07/06/2021   10:00 AM 12/17/2018    1:24 PM 06/29/2018    9:56 AM 09/20/2016    9:57 AM 09/17/2016    9:11 AM 09/10/2016   10:40 AM  Advanced Directives  Does Patient Have a Medical Advance Directive? No No No No No No No  Would patient like information on creating a medical advance directive? No - Patient declined Yes (Inpatient - patient requests chaplain consult to create a medical advance directive)   No - Patient declined  No - Patient declined    Current Medications (verified) Outpatient Encounter Medications as of 02/27/2022  Medication Sig   acetaminophen (TYLENOL) 500 MG tablet Take 500 mg by mouth every 6 (six) hours as needed for headache.   albuterol (VENTOLIN HFA) 108 (90 Base) MCG/ACT  inhaler Inhale 2 puffs into the lungs every 6 (six) hours as needed for wheezing or shortness of breath.   albuterol (VENTOLIN HFA) 108 (90 Base) MCG/ACT inhaler Inhale 2 puffs into the lungs every 6 (six) hours as needed for wheezing or shortness of breath.   aspirin EC 81 MG EC tablet Take 1 tablet (81 mg total) by mouth daily at 6 (six) AM. Swallow whole.   busPIRone (BUSPAR) 7.5 MG tablet Take 1 tablet (7.5 mg total) by mouth 2 (two) times daily.   clopidogrel (PLAVIX) 75 MG tablet Take 75 mg by mouth daily.   clopidogrel (PLAVIX) 75 MG tablet Take 1 tablet (75 mg total) by mouth daily.   ipratropium-albuterol (DUONEB) 0.5-2.5 (3) MG/3ML SOLN TAKE THREE MLS VIA NEBULIZER EVERY 6 HOURS AS NEEDED (Patient taking differently: Inhale 3 mLs into the lungs every 6 (six) hours as needed (shortness of breath).)   naloxone (NARCAN) nasal spray 4 mg/0.1 mL SMARTSIG:In Nostril   oxyCODONE (ROXICODONE) 15 MG immediate release tablet Take 1 tablet (15 mg total) by mouth every 6 (six) hours as needed for pain.   predniSONE (DELTASONE) 10 MG tablet 3 tabs x3 days and then 2 tabs x3 days and then 1 tab x3 days.  Take w/ food.   rosuvastatin (CRESTOR) 20 MG tablet TAKE 1 TABLET BY MOUTH EVERY DAY   sertraline (ZOLOFT) 25 MG tablet Take 1 tablet (25 mg total) by mouth daily.   sertraline (ZOLOFT) 25 MG tablet Take  1 tablet (25 mg total) by mouth daily.   No facility-administered encounter medications on file as of 02/27/2022.    Allergies (verified) Clindamycin/lincomycin   History: Past Medical History:  Diagnosis Date   Anxiety    Chronic low back pain    "degenerative spine dx'd ~ 7 yr ago" (06/08/2013)   Claudication [facility])    Constipation due to pain medication    COPD (chronic obstructive pulmonary disease) (HCC)    Hypertension    "moderately high; RX didn't help" (06/08/2013) - no longer on meds (as of 09/19/16)   Neuromuscular disorder (HCC)    degen spine   Non-healing wound of lower  extremity 06/09/2013   PAD (peripheral artery disease), PTA/stent IDEV & chocolate baloon 06/08/13 04/13/2013   Severely reduced ABI's of 0.3 bilaterally    Tobacco use 06/09/2013   Unexplained weight loss    Past Surgical History:  Procedure Laterality Date   ABDOMINAL AORTAGRAM  05/10/2013   Procedure: ABDOMINAL Ronny Flurry;  Surgeon: Runell Gess, MD;  Location: [facility] CATH LAB;  Service: Cardiovascular;;   ABI     04/09/13; abnormal   ANGIOPLASTY Left 05/21/2016   Procedure: ballon ANGIOPLASTY of femoral to anterior to tibial bypass graft;  Surgeon: Nada Libman, MD;  Location: [facility] OR;  Service: Vascular;  Laterality: Left;   APPENDECTOMY  05/2001   ENDARTERECTOMY FEMORAL Left 12/08/2020   Procedure: REDO LEFT FEMORAL ARTERY ENDARTERECTOMY, LEFT FEMORAL ARTERY THROMBECTOMY;  Surgeon: Nada Libman, MD;  Location: [facility] OR;  Service: Vascular;  Laterality: Left;   FEMORAL ARTERY STENT Right 05/10/13   2 stents Rt SFA   FEMORAL ARTERY STENT Left 06/08/2013   FEMORAL-POPLITEAL BYPASS GRAFT Right 11/12/2013   Procedure: RIGHT  FEMORAL-BELOW KNEE POPLITEAL ARTERY BYPASS GRAFT USING NON- REVERSE RIGHT GREATER SAPHENOUS VEIN.;  Surgeon: Nada Libman, MD;  Location: [facility] OR;  Service: Vascular;  Laterality: Right;   FEMORAL-TIBIAL BYPASS GRAFT Left 12/08/2015   Procedure:  LEFT FEMORAL-ANTERIOR TIBIAL ARTERY BYPASS GRAFT;  Surgeon: Nada Libman, MD;  Location: [facility] OR;  Service: Vascular;  Laterality: Left;   FEMORAL-TIBIAL BYPASS GRAFT Left 09/20/2016   Procedure: REDO BYPASS GRAFT FEMORAL-ANTERIOR TIBIAL ARTERY;  Surgeon: Nada Libman, MD;  Location: [facility] OR;  Service: Vascular;  Laterality: Left;   I & D EXTREMITY Left 07/05/2021   Procedure: IRRIGATION AND DEBRIDEMENT LEFT LOWER EXTREMITY;  Surgeon: Victorino Sparrow, MD;  Location: [facility] OR;  Service: Vascular;  Laterality: Left;   I & D EXTREMITY Left 07/10/2021   Procedure: IRRIGATION AND DEBRIDEMENT LEFT LEG WOUND;  Surgeon: Victorino Sparrow,  MD;  Location: [facility] OR;  Service: Vascular;  Laterality: Left;   LOWER EXTREMITY ANGIOGRAM Bilateral 05/10/2013   Procedure: LOWER EXTREMITY ANGIOGRAM;  Surgeon: Runell Gess, MD;  Location: [facility] CATH LAB;  Service: Cardiovascular;  Laterality: Bilateral;   LOWER EXTREMITY ANGIOGRAM N/A 06/08/2013   Procedure: LOWER EXTREMITY ANGIOGRAM;  Surgeon: Runell Gess, MD;  Location: [facility] CATH LAB;  Service: Cardiovascular;  Laterality: N/A;   LOWER EXTREMITY ANGIOGRAM N/A 10/28/2013   Procedure: LOWER EXTREMITY ANGIOGRAM;  Surgeon: Runell Gess, MD;  Location: [facility] CATH LAB;  Service: Cardiovascular;  Laterality: N/A;   LOWER EXTREMITY ANGIOGRAM N/A 05/16/2014   Procedure: LOWER EXTREMITY ANGIOGRAM;  Surgeon: Runell Gess, MD;  Location: [facility] CATH LAB;  Service: Cardiovascular;  Laterality: N/A;   PATCH ANGIOPLASTY Left 12/08/2020   Procedure: WITH PATCH ANGIOPLASTY USING 1 X 6 CM XENOSURE BIOLOGIC PATCH;  Surgeon:  Nada Libman, MD;  Location: [facility] OR;  Service: Vascular;  Laterality: Left;   PATCH ANGIOPLASTY Left 07/05/2021   Procedure: ANTERIOR TIBIAL ARTERY PATCH USING GREATER SAPHENOUS VEIN;  Surgeon: Victorino Sparrow, MD;  Location: [facility] OR;  Service: Vascular;  Laterality: Left;   PERCUTANEOUS STENT INTERVENTION Right 05/10/2013   Procedure: PERCUTANEOUS STENT INTERVENTION;  Surgeon: Runell Gess, MD;  Location: [facility] CATH LAB;  Service: Cardiovascular;  Laterality: Right;  rt sfa and popliteal stent x2   PERIPHERAL VASCULAR CATHETERIZATION N/A 12/06/2015   Procedure: Abdominal Aortogram w/Lower Extremity;  Surgeon: Nada Libman, MD;  Location: [facility] INVASIVE CV LAB;  Service: Cardiovascular;  Laterality: N/A;   PERIPHERAL VASCULAR CATHETERIZATION N/A 05/22/2016   Procedure: Abdominal Aortogram w/Lower Extremity;  Surgeon: Maeola Harman, MD;  Location: [facility] INVASIVE CV LAB;  Service: Cardiovascular;  Laterality: N/A;   PERIPHERAL VASCULAR CATHETERIZATION N/A 09/17/2016   Procedure:  Abdominal Aortogram w/Lower Extremity;  Surgeon: Nada Libman, MD;  Location: [facility] INVASIVE CV LAB;  Service: Cardiovascular;  Laterality: N/A;   REMOVAL OF GRAFT Left 07/05/2021   Procedure: RESECTION OF LEFT LOWER EXTREMITY BYPASS GRAFT;  Surgeon: Victorino Sparrow, MD;  Location: [facility] OR;  Service: Vascular;  Laterality: Left;   THROMBECTOMY FEMORAL ARTERY Left 05/21/2016   Procedure: THROMBECTOMY FEMORAL TO ANTERIOR TIBIAL BYPASS GRAFT;  Surgeon: Nada Libman, MD;  Location: [facility] OR;  Service: Vascular;  Laterality: Left;   VEIN HARVEST Left 12/08/2015   Procedure: USING NON REVERSE  LEFT GREATER SAPHENOUS VEIN;  Surgeon: Nada Libman, MD;  Location: [facility] OR;  Service: Vascular;  Laterality: Left;   VEIN HARVEST Left 07/05/2021   Procedure: VEIN HARVEST LEFT GREATER SAPHENOUS;  Surgeon: Victorino Sparrow, MD;  Location: [facility] OR;  Service: Vascular;  Laterality: Left;   Family History  Problem Relation Age of Onset   Hyperlipidemia Mother    Heart disease Mother    Diabetes Mother    Cancer Father        Liver   Social History   Socioeconomic History   Marital status: Divorced    Spouse name: Not on file   Number of children: Not on file   Years of education: Not on file   Highest education level: Not on file  Occupational History   Occupation: former golf pro  Tobacco Use   Smoking status: Former    Packs/day: 0.25    Years: 50.00    Total pack years: 12.50    Types: Cigarettes    Start date: 11/14/2015    Quit date: 11/13/2016    Years since quitting: 5.2   Smokeless tobacco: Never  Vaping Use   Vaping Use: Never used  Substance and Sexual Activity   Alcohol use: No    Alcohol/week: 0.0 standard drinks of alcohol    Comment: 06/08/2013 "quit 02/2007; drank my share before that though"   Drug use: No   Sexual activity: Yes  Other Topics Concern   Not on file  Social History Narrative   Not on file   Social Determinants of Health   Financial Resource Strain: Low Risk   (02/27/2022)   Overall Financial Resource Strain (CARDIA)    Difficulty of Paying Living Expenses: Not hard at all  Food Insecurity: No Food Insecurity (02/27/2022)   Hunger Vital Sign    Worried About Running Out of Food in the Last Year: Never true    Ran Out of Food in the Last Year: Never true  Transportation Needs: No Transportation Needs (02/27/2022)   PRAPARE - Hydrologist (Medical): No    Lack of Transportation (Non-Medical): No  Physical Activity: Inactive (02/27/2022)   Exercise Vital Sign    Days of Exercise per Week: 0 days    Minutes of Exercise per Session: 0 min  Stress: No Stress Concern Present (02/27/2022)   [place]    Feeling of Stress : Not at all  Social Connections: Socially Isolated (02/27/2022)   Social Connection and Isolation Panel [NHANES]    Frequency of Communication with Friends and Family: Twice a week    Frequency of Social Gatherings with Friends and Family: Twice a week    Attends Religious Services: Never    Marine scientist or Organizations: No    Attends Music therapist: Never    Marital Status: Divorced    Tobacco Counseling Counseling given: Not Answered   Clinical Intake:  Pre-visit preparation completed: Yes  Pain : No/denies pain     Nutritional Risks: None Diabetes: No  How often do you need to have someone help you when you read instructions, pamphlets, or other written materials from your doctor or pharmacy?: 1 - Never What is the last grade level you completed in school?: [place]  Interpreter Needed?: No  Information entered by :: L.Farris Blash,LPN   Activities of Daily Living    02/27/2022    3:32 PM 07/06/2021   10:00 AM  In your present state of health, do you have any difficulty performing the following activities:  Hearing? 0 1  Vision? 0 0  Difficulty concentrating or making decisions?  0 1  Walking or climbing stairs? 0 1  Comment  sob  Dressing or bathing? 0 1  Doing errands, shopping? 0 0  Preparing Food and eating ? N   Using the Toilet? N   In the past six months, have you accidently leaked urine? N   Do you have problems with loss of bowel control? N   Managing your Medications? N   Managing your Finances? N   Housekeeping or managing your Housekeeping? N     Patient Care Team: [name], MD as PCP - General (Family Medicine) Lorretta Harp, MD as Consulting Physician (Cardiology) [facility] (Pain Medicine) Serafina Mitchell, MD as Consulting Physician (Vascular Surgery) Madelin Rear, [facility] as Pharmacist (Pharmacist)  Indicate any recent Medical Services you may have received from other than [facility] providers in the past year (date may be approximate).     Assessment:   This is a routine wellness examination for Vash.  Hearing/Vision screen Vision Screening - Comments:: Declined   Dietary issues and exercise activities discussed: Current Exercise Habits: The patient does not participate in regular exercise at present, Exercise limited by: None identified   Goals Addressed   None    Depression Screen    02/27/2022    3:30 PM 02/27/2022    3:28 PM 01/02/2022   12:54 PM 03/31/2019    9:45 AM 02/12/2017    1:12 PM 08/14/2016    2:19 PM 08/14/2016    1:35 PM  PHQ 2/9 Scores  PHQ - 2 Score 0 0 1 0 0 0 0  PHQ- 9 Score   7 0 0  0    Fall Risk    02/27/2022    3:30 PM 01/02/2022   12:54 PM 03/31/2019  9:45 AM 08/14/2016    2:19 PM 08/14/2016    1:35 PM  Fall Risk   Falls in the past year? 0 1 0 No No  Number falls in past yr: 0 0 0    Injury with Fall? 0 0 0    Risk for fall due to :  History of fall(s)     Follow up Education provided Falls evaluation completed       [place]:  Any stairs in or around the home? No  If so, are there any without handrails? No  Home free of loose  throw rugs in walkways, pet beds, electrical cords, etc? Yes  Adequate lighting in your home to reduce risk of falls? Yes   ASSISTIVE DEVICES UTILIZED TO PREVENT FALLS:  Life alert? No  Use of a cane, walker or w/c? No  Grab bars in the bathroom? Yes  Shower chair or bench in shower? Yes  Elevated toilet seat or a handicapped toilet? No     Cognitive Function:    Normal cognitive status assessed by telephone conversation  by this Nurse Health Advisor. No abnormalities found.      Immunizations Immunization History  Administered Date(s) Administered   Fluad Quad(high Dose 65+) 04/15/2019, 07/07/2021   Influenza Whole 06/10/2016   Influenza, High Dose Seasonal PF 05/11/2014, 06/16/2018   Influenza-Unspecified 05/19/2013, 06/16/2018, 05/15/2020, 07/10/2021   PFIZER(Purple Top)SARS-COV-2 Vaccination 10/02/2019, 10/25/2019   Pneumococcal Conjugate-13 05/13/2016    TDAP status: Due, Education has been provided regarding the importance of this vaccine. Advised may receive this vaccine at local pharmacy or Health Dept. Aware to provide a copy of the vaccination record if obtained from local pharmacy or Health Dept. Verbalized acceptance and understanding.  Flu Vaccine status: Up to date  Pneumococcal vaccine status: Up to date  Covid-19 vaccine status: Completed vaccines  Qualifies for Shingles Vaccine? Yes   Zostavax completed No   Shingrix Completed?: No.    Education has been provided regarding the importance of this vaccine. Patient has been advised to call insurance company to determine out of pocket expense if they have not yet received this vaccine. Advised may also receive vaccine at local pharmacy or Health Dept. Verbalized acceptance and understanding.  Screening Tests Health Maintenance  Topic Date Due   COLONOSCOPY (Pts 45-32yrs Insurance coverage will need to be confirmed)  Never done   Zoster Vaccines- Shingrix (1 of 2) 04/04/2022 (Originally 06/15/1996)    INFLUENZA VACCINE  03/19/2022   HPV VACCINES  Aged Out   Pneumonia Vaccine 39+ Years old  Discontinued   TETANUS/TDAP  Discontinued   COVID-19 Vaccine  Discontinued   Hepatitis C Screening  Discontinued    Health Maintenance  Health Maintenance Due  Topic Date Due   COLONOSCOPY (Pts 45-45yrs Insurance coverage will need to be confirmed)  Never done    Colorectal cancer screening: No longer required.   Lung Cancer Screening: (Low Dose CT Chest recommended if Age 9-80 years, 30 pack-year currently smoking OR have quit w/in 15years.) does not qualify.   Lung Cancer Screening Referral: n/a  Additional Screening:  Hepatitis C Screening: does not qualify;  Vision Screening: Recommended annual ophthalmology exams for early detection of glaucoma and other disorders of the eye. Is the patient up to date with their annual eye exam?  No  Who is the provider or what is the name of the office in which the patient attends annual eye exams? No  If pt is  not established with a provider, would they like to be referred to a provider to establish care? No .   Dental Screening: Recommended annual dental exams for proper oral hygiene  Community Resource Referral / Chronic Care Management: CRR required this visit?  No   CCM required this visit?  No      Plan:     I have personally reviewed and noted the following in the patient's chart:   Medical and social history Use of alcohol, tobacco or illicit drugs  Current medications and supplements including opioid prescriptions. Patient is not currently taking opioid prescriptions. Functional ability and status Nutritional status Physical activity Advanced directives List of other physicians Hospitalizations, surgeries, and ER visits in previous 12 months Vitals Screenings to include cognitive, depression, and falls Referrals and appointments  In addition, I have reviewed and discussed with patient certain preventive protocols, quality  metrics, and best practice recommendations. A written personalized care plan for preventive services as well as general preventive health recommendations were provided to patient.     Randel Pigg, LPN   [id number]   Nurse Notes: none

## 2022-03-22 ENCOUNTER — Telehealth: Payer: Self-pay | Admitting: Pharmacist

## 2022-03-22 NOTE — Progress Notes (Signed)
Chronic Care Management Pharmacy Assistant   Name: Christopher Pena  MRN: [id number] DOB: 11-May-1946   Reason for Encounter: Disease State - General Adherence Call     Recent office visits:  02/27/22 Annual Medicare Wellness Completed  Recent consult visits:  None noted.   Hospital visits:  None in previous 6 months  Medications: Outpatient Encounter Medications as of 03/22/2022  Medication Sig   acetaminophen (TYLENOL) 500 MG tablet Take 500 mg by mouth every 6 (six) hours as needed for headache.   albuterol (VENTOLIN HFA) 108 (90 Base) MCG/ACT inhaler Inhale 2 puffs into the lungs every 6 (six) hours as needed for wheezing or shortness of breath.   albuterol (VENTOLIN HFA) 108 (90 Base) MCG/ACT inhaler Inhale 2 puffs into the lungs every 6 (six) hours as needed for wheezing or shortness of breath.   aspirin EC 81 MG EC tablet Take 1 tablet (81 mg total) by mouth daily at 6 (six) AM. Swallow whole.   busPIRone (BUSPAR) 7.5 MG tablet Take 1 tablet (7.5 mg total) by mouth 2 (two) times daily.   clopidogrel (PLAVIX) 75 MG tablet Take 75 mg by mouth daily.   clopidogrel (PLAVIX) 75 MG tablet Take 1 tablet (75 mg total) by mouth daily.   ipratropium-albuterol (DUONEB) 0.5-2.5 (3) MG/3ML SOLN TAKE THREE MLS VIA NEBULIZER EVERY 6 HOURS AS NEEDED (Patient taking differently: Inhale 3 mLs into the lungs every 6 (six) hours as needed (shortness of breath).)   naloxone (NARCAN) nasal spray 4 mg/0.1 mL SMARTSIG:In Nostril   oxyCODONE (ROXICODONE) 15 MG immediate release tablet Take 1 tablet (15 mg total) by mouth every 6 (six) hours as needed for pain.   predniSONE (DELTASONE) 10 MG tablet 3 tabs x3 days and then 2 tabs x3 days and then 1 tab x3 days.  Take w/ food.   rosuvastatin (CRESTOR) 20 MG tablet TAKE 1 TABLET BY MOUTH EVERY DAY   sertraline (ZOLOFT) 25 MG tablet Take 1 tablet (25 mg total) by mouth daily.   sertraline (ZOLOFT) 25 MG tablet Take 1 tablet (25 mg total) by mouth daily.    No facility-administered encounter medications on file as of 03/22/2022.    Contacted [name] Kowalke for General Review Call   Chart Review:  Have there been any documented new, changed, or discontinued medications since last visit? No (If yes, include name, dose, frequency, date) Has there been any documented recent hospitalizations or ED visits since last visit with Clinical Pharmacist? No Brief Summary (including medication and/or Diagnosis changes):   Adherence Review:  Does the Clinical Pharmacist Assistant have access to adherence rates? Yes Adherence rates for STAR metric medications (List medication(s)/day supply/ last 2 fill dates). Adherence rates for medications indicated for disease state being reviewed (List medication(s)/day supply/ last 2 fill dates). Does the patient have >5 day gap between last estimated fill dates for any of the above medications or other medication gaps? No Reason for medication gaps.   Disease State Questions:  Able to connect with Patient? Yes Did patient have any problems with their health recently? No Note problems and Concerns: Have you had any admissions or emergency room visits or worsening of your condition(s) since last visit? No Details of ED visit, hospital visit and/or worsening condition(s): Have you had any visits with new specialists or providers since your last visit? No Explain: Have you had any new health care problem(s) since your last visit? No New problem(s) reported: Have you run out of any of  your medications since you last spoke with clinical pharmacist? No What caused you to run out of your medications? Are there any medications you are not taking as prescribed? No What kept you from taking your medications as prescribed? Are you having any issues or side effects with your medications? No Note of issues or side effects: Do you have any other health concerns or questions you want to discuss with your Clinical  Pharmacist before your next visit? No Note additional concerns and questions from Patient. Are there any health concerns that you feel we can do a better job addressing? No Note Patient's response. Are you having any problems with any of the following since the last visit: (select all that apply)  None  Details: 12. Any falls since last visit? No  Details: 13. Any increased or uncontrolled pain since last visit? No  Details: 14. Next visit Type: office       Visit with: Beverely Low (for physical)        Date: 07/04/23        Time: 12:40pm  15. Additional Details? No     Care Gaps   AWV: done 02/17/22 Colonoscopy: unknown DM Eye Exam: N/A DM Foot Exam: N/A Microalbumin: N/A HbgAIC: N/A DEXA: N/A Mammogram: N/A   Star Rating Drugs: Rosuvastatin (CRESTOR) 20 MG tablet - last filled 12/27/21 90 days   Future Appointments  Date Time Provider Department Center  05/15/2022  2:00 PM [facility] PHARMACIST [facility]  07/03/2022 12:40 PM Sheliah Hatch, MD [facility]      Eugenie Filler, [facility] Clinical Pharmacist Assistant  [phone]

## 2022-03-26 DIAGNOSIS — Z79891 Long term (current) use of opiate analgesic: Secondary | ICD-10-CM | POA: Diagnosis not present

## 2022-03-26 DIAGNOSIS — G894 Chronic pain syndrome: Secondary | ICD-10-CM | POA: Diagnosis not present

## 2022-03-26 DIAGNOSIS — M5136 Other intervertebral disc degeneration, lumbar region: Secondary | ICD-10-CM | POA: Diagnosis not present

## 2022-03-26 DIAGNOSIS — M5431 Sciatica, right side: Secondary | ICD-10-CM | POA: Diagnosis not present

## 2022-03-26 DIAGNOSIS — M79605 Pain in left leg: Secondary | ICD-10-CM | POA: Diagnosis not present

## 2022-03-26 DIAGNOSIS — G890 Central pain syndrome: Secondary | ICD-10-CM | POA: Diagnosis not present

## 2022-03-26 DIAGNOSIS — M5432 Sciatica, left side: Secondary | ICD-10-CM | POA: Diagnosis not present

## 2022-03-26 DIAGNOSIS — M79604 Pain in right leg: Secondary | ICD-10-CM | POA: Diagnosis not present

## 2022-03-26 DIAGNOSIS — M545 Low back pain, unspecified: Secondary | ICD-10-CM | POA: Diagnosis not present

## 2022-03-28 ENCOUNTER — Telehealth: Payer: Self-pay | Admitting: Pharmacist

## 2022-03-28 NOTE — Progress Notes (Signed)
Chronic Care Management Pharmacy Assistant   Name: ZACKARI RUANE  MRN: [id number] DOB: 07/07/1946   Reason for Encounter: Monthly Medication Coordination Call     Medications: Outpatient Encounter Medications as of 03/28/2022  Medication Sig   acetaminophen (TYLENOL) 500 MG tablet Take 500 mg by mouth every 6 (six) hours as needed for headache.   albuterol (VENTOLIN HFA) 108 (90 Base) MCG/ACT inhaler Inhale 2 puffs into the lungs every 6 (six) hours as needed for wheezing or shortness of breath.   albuterol (VENTOLIN HFA) 108 (90 Base) MCG/ACT inhaler Inhale 2 puffs into the lungs every 6 (six) hours as needed for wheezing or shortness of breath.   aspirin EC 81 MG EC tablet Take 1 tablet (81 mg total) by mouth daily at 6 (six) AM. Swallow whole.   busPIRone (BUSPAR) 7.5 MG tablet Take 1 tablet (7.5 mg total) by mouth 2 (two) times daily.   clopidogrel (PLAVIX) 75 MG tablet Take 75 mg by mouth daily.   clopidogrel (PLAVIX) 75 MG tablet Take 1 tablet (75 mg total) by mouth daily.   ipratropium-albuterol (DUONEB) 0.5-2.5 (3) MG/3ML SOLN TAKE THREE MLS VIA NEBULIZER EVERY 6 HOURS AS NEEDED (Patient taking differently: Inhale 3 mLs into the lungs every 6 (six) hours as needed (shortness of breath).)   naloxone (NARCAN) nasal spray 4 mg/0.1 mL SMARTSIG:In Nostril   oxyCODONE (ROXICODONE) 15 MG immediate release tablet Take 1 tablet (15 mg total) by mouth every 6 (six) hours as needed for pain.   predniSONE (DELTASONE) 10 MG tablet 3 tabs x3 days and then 2 tabs x3 days and then 1 tab x3 days.  Take w/ food.   rosuvastatin (CRESTOR) 20 MG tablet TAKE 1 TABLET BY MOUTH EVERY DAY   sertraline (ZOLOFT) 25 MG tablet Take 1 tablet (25 mg total) by mouth daily.   sertraline (ZOLOFT) 25 MG tablet Take 1 tablet (25 mg total) by mouth daily.   No facility-administered encounter medications on file as of 03/28/2022.    Reviewed chart for medication changes ahead of medication coordination  call.  No OVs, Consults, or hospital visits since last care coordination call/Pharmacist visit. (If appropriate, list visit date, provider name)  No medication changes indicated OR if recent visit, treatment plan here.  BP Readings from Last 3 Encounters:  01/02/22 116/80  09/17/21 (!) 144/75  08/22/21 (!) 143/85    Lab Results  Component Value Date   HGBA1C 5.8 (H) 07/06/2021     Patient obtains medications through Vials  w easy open caps 90 Days    Last adherence delivery included: (medication name and frequency) Sertraline 25 mg 1 tablet daily Clopidogrel 75 mg 1 tablet daily  Docusate 100 mg 1 capsule daily  Declined: [facility]atin 20 mg 1 tablet daily   Patient is due for next adherence delivery on: 04/09/22. Called patient and reviewed medications and coordinated delivery.   This delivery to include: Sertraline 25 mg 1 tablet daily Clopidogrel 75 mg 1 tablet daily  Docusate 100 mg 1 capsule daily Rosuvastatin 20 mg 1 tablet daily Buspirone 7.5mg  1 tablet twice daily    Patient needs refills from PCP for 90 days Sertraline 25 mg 1 tablet daily Buspirone 7.5mg  1 tablet twice daily    Confirmed delivery date of 04/09/22, advised patient that pharmacy will contact them the morning of delivery.   Care Gaps   AWV: overdue Colonoscopy: unknown DM Eye Exam: N/A DM Foot Exam: N/A Microalbumin: N/A HbgAIC:  N/A DEXA: N/A Mammogram: N/A    Star Rating Drugs: Rosuvastatin (CRESTOR) 20 MG tablet - last filled 03/23/22 90 days    Future Appointments  Date Time Provider Department Center  05/15/2022  2:00 PM [facility] PHARMACIST [facility]  07/03/2022 12:40 PM Sheliah Hatch, MD [facility]    Eugenie Filler, [facility] Clinical Pharmacist Assistant  [phone]

## 2022-04-04 ENCOUNTER — Other Ambulatory Visit: Payer: Self-pay | Admitting: Family Medicine

## 2022-04-04 ENCOUNTER — Other Ambulatory Visit: Payer: Self-pay

## 2022-04-04 DIAGNOSIS — F419 Anxiety disorder, unspecified: Secondary | ICD-10-CM

## 2022-04-04 MED ORDER — SERTRALINE HCL 25 MG PO TABS: 25.0000 mg | ORAL_TABLET | Freq: Every day | ORAL | 1 refills | Status: DC

## 2022-04-04 MED ORDER — ROSUVASTATIN CALCIUM 20 MG PO TABS: 20.0000 mg | ORAL_TABLET | Freq: Every day | ORAL | 1 refills | Status: DC

## 2022-04-04 MED ORDER — BUSPIRONE HCL 7.5 MG PO TABS
7.5000 mg | ORAL_TABLET | Freq: Two times a day (BID) | ORAL | 1 refills | Status: DC
Start: 2022-04-04 — End: 2023-04-29

## 2022-04-18 DIAGNOSIS — M5431 Sciatica, right side: Secondary | ICD-10-CM | POA: Diagnosis not present

## 2022-04-18 DIAGNOSIS — M5432 Sciatica, left side: Secondary | ICD-10-CM | POA: Diagnosis not present

## 2022-04-18 DIAGNOSIS — M79605 Pain in left leg: Secondary | ICD-10-CM | POA: Diagnosis not present

## 2022-04-18 DIAGNOSIS — Z79891 Long term (current) use of opiate analgesic: Secondary | ICD-10-CM | POA: Diagnosis not present

## 2022-04-18 DIAGNOSIS — M79604 Pain in right leg: Secondary | ICD-10-CM | POA: Diagnosis not present

## 2022-04-18 DIAGNOSIS — G890 Central pain syndrome: Secondary | ICD-10-CM | POA: Diagnosis not present

## 2022-04-18 DIAGNOSIS — G894 Chronic pain syndrome: Secondary | ICD-10-CM | POA: Diagnosis not present

## 2022-04-18 DIAGNOSIS — M5136 Other intervertebral disc degeneration, lumbar region: Secondary | ICD-10-CM | POA: Diagnosis not present

## 2022-04-18 DIAGNOSIS — M545 Low back pain, unspecified: Secondary | ICD-10-CM | POA: Diagnosis not present

## 2022-04-20 IMAGING — CT CT ANGIO CHEST
3 of 7 series · 18 of 36 positions shown · IV contrast (omnipaque)
Comparison: CT chest angiogram, 08/08/2020, 12/17/2018, CT
angiogram abdomen pelvis 12/06/2020

CLINICAL DATA: PE suspected

EXAM:
CT ANGIOGRAPHY CHEST WITH CONTRAST
TECHNIQUE: Multidetector CT imaging of the chest was performed using the
standard protocol during bolus administration of intravenous
contrast. Multiplanar CT image reconstructions and MIPs were
obtained to evaluate the vascular anatomy.
CONTRAST:  100 mL OMNIPAQUE IOHEXOL 350 MG/ML SOLN

[Series 7: pe lung · axial · 0.85mm/px · z∈[-831,-761]mm · 2 of 141 slices shown]
[im 36/141  mediastinal]
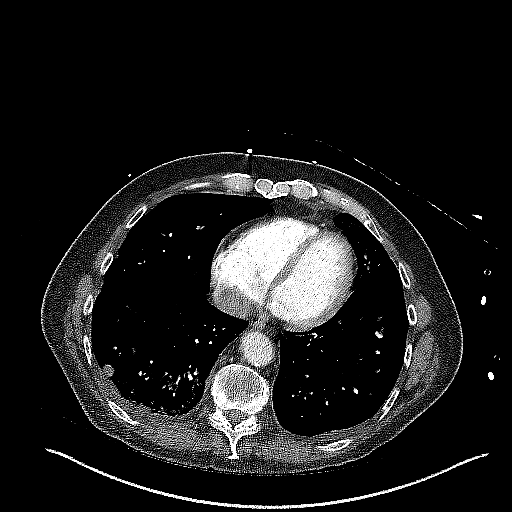
[im 71/141  mediastinal]
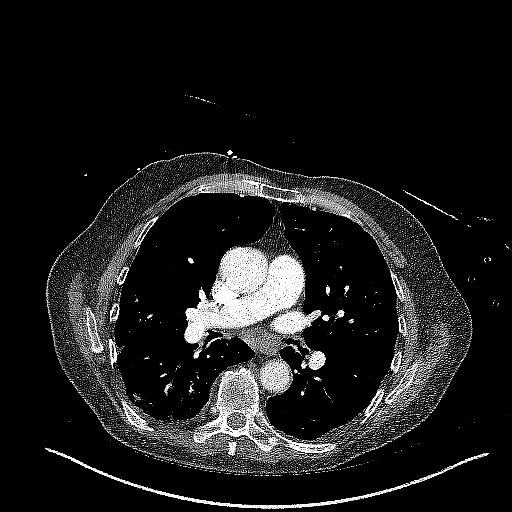

[Series 8: pe thins · axial · 0.85mm/px · z∈[-946,-643]mm · 15 of 494 slices shown]
[im 31/494  lung]
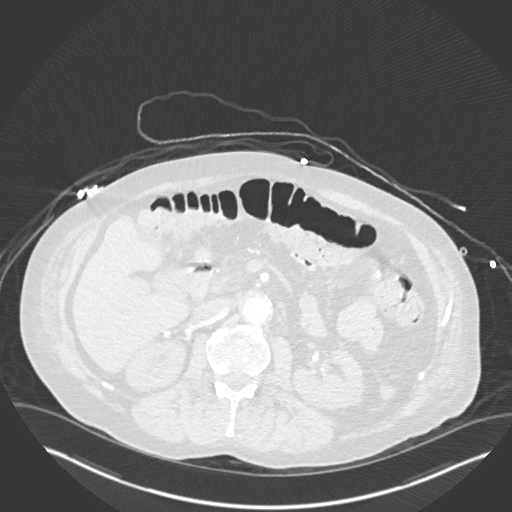
[im 62/494  mediastinal]
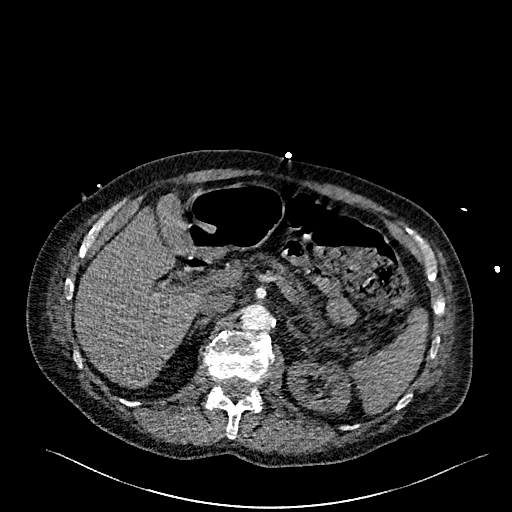
[im 93/494  lung]
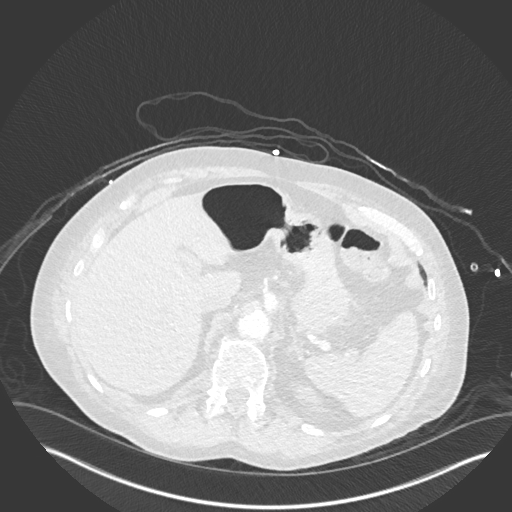
[im 124/494  mediastinal]
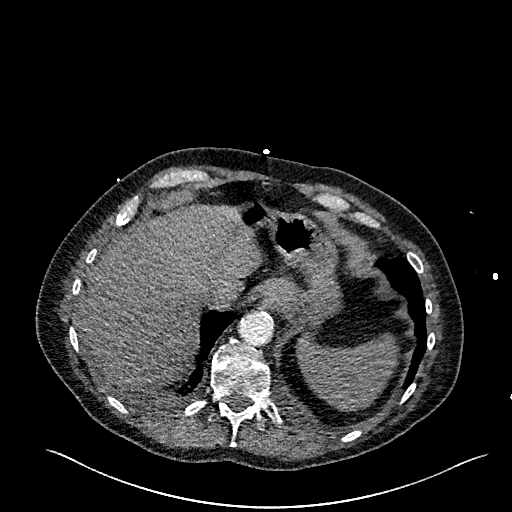
[im 155/494  lung]
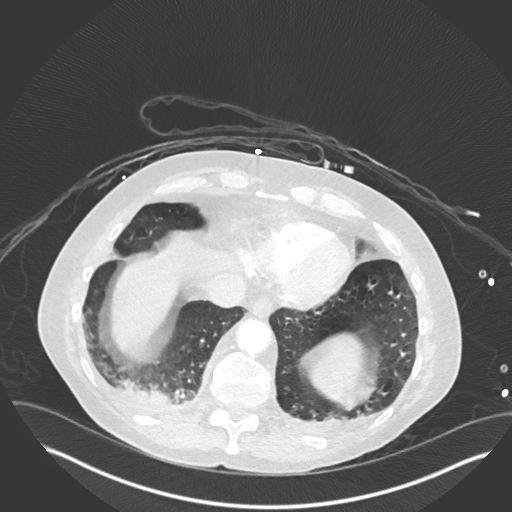
[im 185/494  mediastinal]
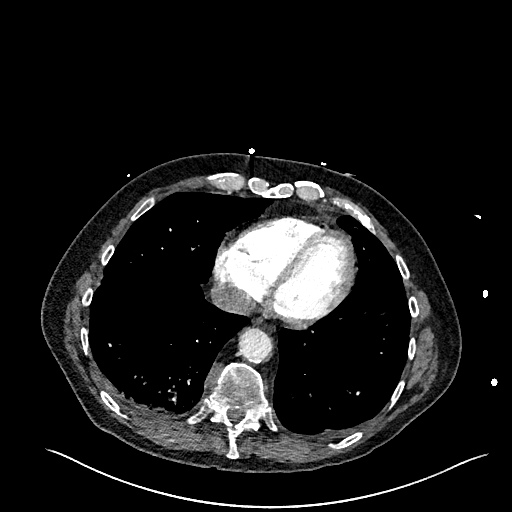
[im 216/494  lung]
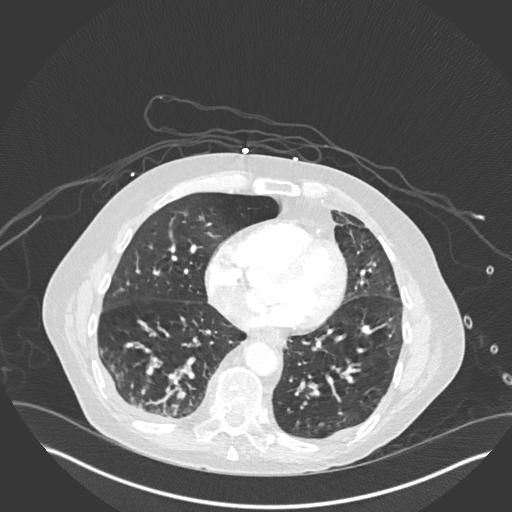
[im 247/494  mediastinal]
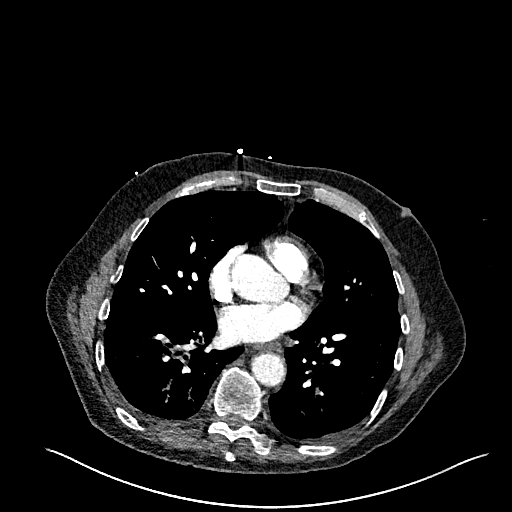
[im 278/494  lung]
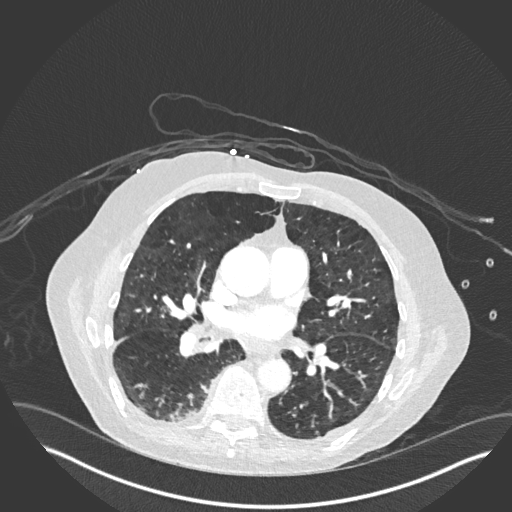
[im 309/494  mediastinal]
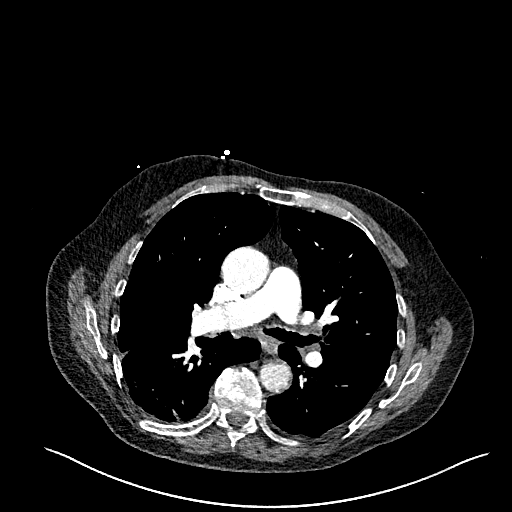
[im 339/494  lung]
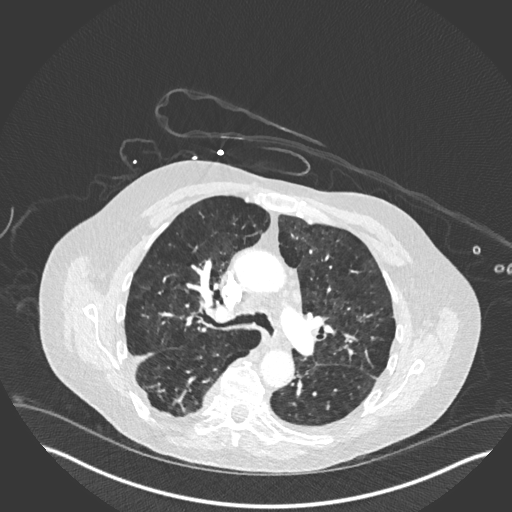
[im 370/494  mediastinal]
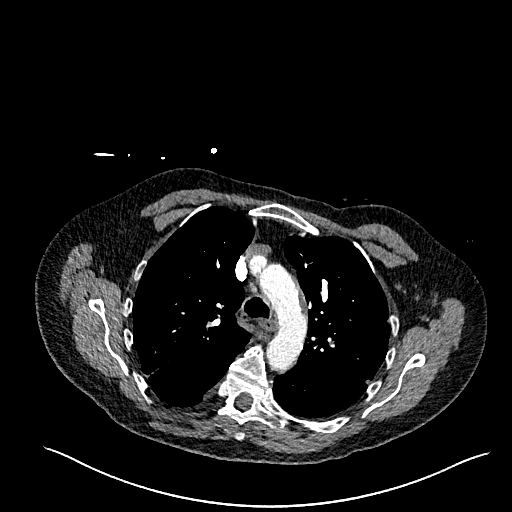
[im 401/494  lung]
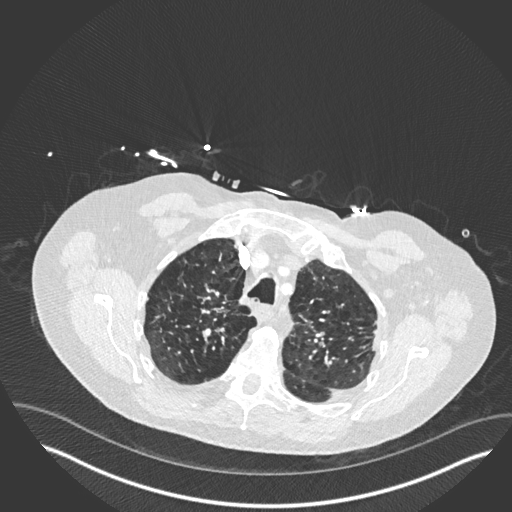
[im 432/494  mediastinal]
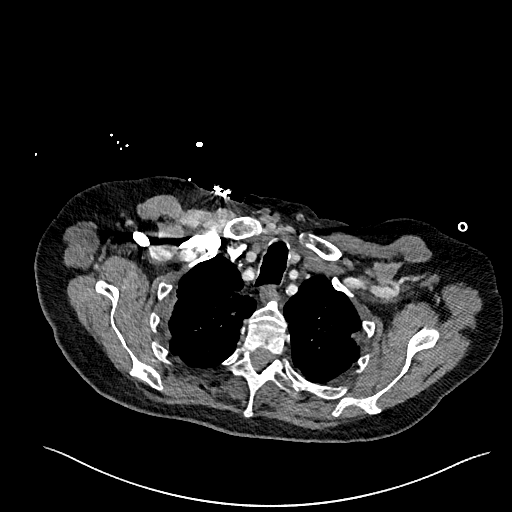
[im 463/494  lung]
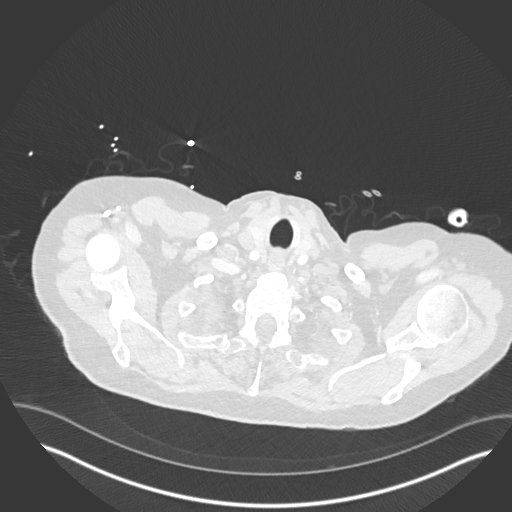

[Series 9: pe 2mm cor · coronal · 0.68mm/px · 1 of 150 slices shown]
[im 75/150  mediastinal]
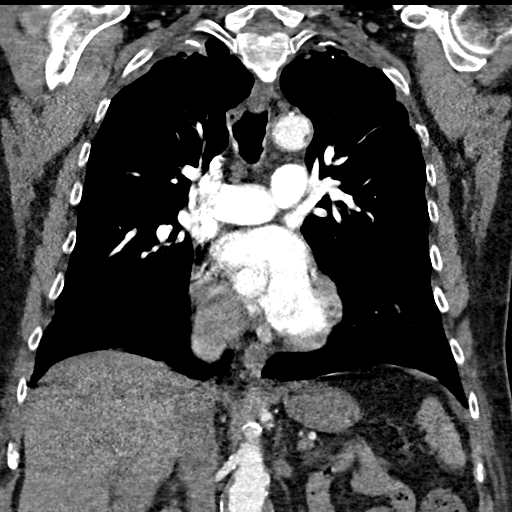

[18 of 36 positions shown; findings below may reference images not displayed]

FINDINGS: Cardiovascular: Satisfactory opacification of the pulmonary arteries
to the segmental level. No evidence of pulmonary embolism. Normal
heart size. No pericardial effusion. Aortic atherosclerosis.

Mediastinum/Nodes: No enlarged mediastinal, hilar, or axillary lymph
nodes. Thyroid gland, trachea, and esophagus demonstrate no
significant findings.

Lungs/Pleura: Moderate centrilobular emphysema. Diffuse bilateral
bronchial wall thickening. Bronchial wall thickening and bronchiolar
plugging most notably in the dependent bilateral lung bases, similar
to prior examination. Heterogeneous airspace opacity and
consolidation of the dependent right lung base. Stable, benign
cm subpleural nodule of the dependent right lower lobe (series 7,
image 106). No pleural effusion or pneumothorax.

Upper Abdomen: No acute abnormality. Incompletely imaged infrarenal
abdominal aortic aneurysm with a large burden of mural thrombus,
greatest caliber included on this examination 4.3 x 3.9 cm.

Musculoskeletal: No chest wall abnormality. No acute or significant
osseous findings.

Review of the MIP images confirms the above findings.
IMPRESSION: 1. Negative examination for pulmonary embolism.
2. Diffuse bilateral bronchial wall thickening and bronchiolar
plugging most notably in the dependent bilateral lung bases, similar
to prior examination, as well as heterogeneous airspace opacity and
consolidation of the dependent right lung base, findings strongly
suggestive of aspiration, likely ongoing.
3. Emphysema.
4. Incompletely imaged infrarenal abdominal aortic aneurysm with a
large burden of mural thrombus, greatest caliber included on this
examination 4.3 x 3.9 cm. Recommend follow-up every 12 months and
vascular consultation. This recommendation follows ACR consensus
guidelines: White Paper of the ACR Incidental Findings Committee II

Aortic Atherosclerosis (0Y13N-HV4.4) and Emphysema (0Y13N-TZM.3).

## 2022-05-06 NOTE — Progress Notes (Signed)
Chronic Care Management Pharmacy Note  05/15/2022 Name:  Christopher Pena MRN:  [id number] DOB:  [birth year]  Summary: PharmD FU visit.  Severe COPD patient not on maintenance inhaler therapy.  I would classify him as sever based on CAT and reported symptoms.  Reports daily sputum production.  Oxygen use 24/7  Recommendations/Changes made from today's visit: Consider LABA/LAMA combo - consider Bevespi for ease of access with assistance program.  Will consult with pulm  Plan: FU 6 months   Subjective: Christopher Pena is an 76 y.o. year old male who is a primary patient of Tabori, Aundra Millet, MD.  The CCM team was consulted for assistance with disease management and care coordination needs.    Engaged with patient by telephone for follow up visit in response to provider referral for pharmacy case management and/or care coordination services.   Consent to Services:  The patient was given the following information about Chronic Care Management services today, agreed to services, and gave verbal consent: 1. CCM service includes personalized support from designated clinical staff supervised by the primary care provider, including individualized plan of care and coordination with other care providers 2. 24/7 contact phone numbers for assistance for urgent and routine care needs. 3. Service will only be billed when office clinical staff spend 20 minutes or more in a month to coordinate care. 4. Only one practitioner may furnish and bill the service in a calendar month. 5.The patient may stop CCM services at any time (effective at the end of the month) by phone call to the office staff. 6. The patient will be responsible for cost sharing (co-pay) of up to 20% of the service fee (after annual deductible is met). Patient agreed to services and consent obtained.  Patient Care Team: [name], MD as PCP - General (Family Medicine) Lorretta Harp, MD as Consulting Physician  (Cardiology) Center, [facility] (Pain Medicine) Serafina Mitchell, MD as Consulting Physician (Vascular Surgery) Edythe Clarity, [facility] (Pharmacist)  Recent office visits:  01/02/22 Christopher Pena <D - Family Medicine - Anxiety - RESTART the Buspirone twice daily. Follow up in 6 months for physical.   Recent consult visits:  None noted.    Hospital visits:  None in previous 6 months  Objective:  Lab Results  Component Value Date   CREATININE 0.89 01/02/2022   BUN 13 01/02/2022   GFR 83.80 01/02/2022   GFRNONAA >60 07/11/2021   GFRAA >60 12/24/2018   NA 139 01/02/2022   K 4.2 01/02/2022   CALCIUM 9.0 01/02/2022   CO2 30 01/02/2022   GLUCOSE 156 (H) 01/02/2022    Lab Results  Component Value Date/Time   HGBA1C 5.8 (H) 07/06/2021 11:04 AM   HGBA1C 5.7 (H) 12/17/2018 07:09 PM   GFR 83.80 01/02/2022 01:38 PM   GFR 72.36 09/06/2019 11:09 AM    Last diabetic Eye exam: No results found for: "HMDIABEYEEXA"  Last diabetic Foot exam: No results found for: "HMDIABFOOTEX"   Lab Results  Component Value Date   CHOL 136 01/02/2022   HDL 79.10 01/02/2022   LDLCALC 42 01/02/2022   TRIG 76.0 01/02/2022   CHOLHDL 2 01/02/2022       Latest Ref Rng & Units 01/02/2022    1:38 PM 07/05/2021    3:39 PM 12/07/2020    6:53 AM  Hepatic Function  Total Protein 6.0 - 8.3 g/dL 8.0  7.5  7.2   Albumin 3.5 - 5.2 g/dL 4.2  3.0  3.5  AST 0 - 37 U/L 37  26  21   ALT 0 - 53 U/L _0 Alk Phosphatase 39 - 117 U/L 88  64  54   Total Bilirubin 0.2 - 1.2 mg/dL 0.3  0.6  0.7   Bilirubin, Direct 0.0 - 0.3 mg/dL 0.1       Lab Results  Component Value Date/Time   TSH 1.43 01/02/2022 01:38 PM   TSH 1.53 09/06/2019 11:09 AM       Latest Ref Rng & Units 01/02/2022    1:38 PM 07/11/2021   12:46 AM 07/10/2021    1:18 AM  CBC  WBC 4.0 - 10.5 K/uL 13.6  13.2  13.8   Hemoglobin 13.0 - 17.0 g/dL 12.6  8.0  8.0   Hematocrit 39.0 - 52.0 % 40.1  26.3  26.4   Platelets 150.0 -  400.0 K/uL 288.0  444  425     No results found for: "VD25OH"      02/27/2022    3:30 PM 02/27/2022    3:28 PM 01/02/2022   12:54 PM  Depression screen PHQ 2/9  Decreased Interest 0 0 1  Down, Depressed, Hopeless 0 0   PHQ - 2 Score 0 0 1  Altered sleeping   1  Tired, decreased energy   2  Change in appetite   1  Feeling bad or failure about yourself    0  Trouble concentrating   2  Moving slowly or fidgety/restless   0  Suicidal thoughts   0  PHQ-9 Score   7  Difficult doing work/chores   Very difficult    Social History   Tobacco Use  Smoking Status Former   Packs/day: 0.25   Years: 50.00   Total pack years: 12.50   Types: Cigarettes   Start date: 11/14/2015   Quit date: 11/13/2016   Years since quitting: 5.5  Smokeless Tobacco Never   BP Readings from Last 3 Encounters:  01/02/22 116/80  09/17/21 (!) 144/75  08/22/21 (!) 143/85   Pulse Readings from Last 3 Encounters:  01/02/22 (!) 125  09/17/21 69  08/22/21 94   Wt Readings from Last 3 Encounters:  01/02/22 157 lb 6.4 oz (71.4 kg)  09/17/21 152 lb (68.9 kg)  08/22/21 152 lb (68.9 kg)   BMI Readings from Last 3 Encounters:  01/02/22 24.65 kg/m  09/17/21 23.81 kg/m  08/22/21 23.81 kg/m    Assessment/Interventions: Review of patient past medical history, allergies, medications, health status, including review of consultants reports, laboratory and other test data, was performed as part of comprehensive evaluation and provision of chronic care management services.   SDOH:  (Social Determinants of Health) assessments and interventions performed: No Financial Resource Strain: Low Risk  (02/27/2022)   Overall Financial Resource Strain (CARDIA)    Difficulty of Paying Living Expenses: Not hard at all    SDOH Interventions    Flowsheet Row Clinical Support from 02/27/2022 in [place] Management from 12/23/2019 in [place]  SDOH Interventions    Food Insecurity Interventions Intervention Not Indicated Other (Comment)  Housing Interventions Intervention Not Indicated --  Transportation Interventions Intervention Not Indicated Other (Comment)  Financial Strain Interventions Intervention Not Indicated --  Physical Activity Interventions Intervention Not Indicated --  Stress Interventions Intervention Not Indicated --  Social Connections Interventions Intervention Not Indicated --      SDOH Screenings   Food Insecurity: No  Food Insecurity (02/27/2022)  Housing: Low Risk  (02/27/2022)  Transportation Needs: No Transportation Needs (02/27/2022)  Alcohol Screen: Low Risk  (02/27/2022)  Depression (PHQ2-9): Low Risk  (02/27/2022)  Recent Concern: Depression (PHQ2-9) - Medium Risk (01/02/2022)  Financial Resource Strain: Low Risk  (02/27/2022)  Physical Activity: Inactive (02/27/2022)  Social Connections: Socially Isolated (02/27/2022)  Stress: No Stress Concern Present (02/27/2022)  Tobacco Use: Medium Risk (02/27/2022)    CCM Care Plan  Allergies  Allergen Reactions   Clindamycin/Lincomycin Diarrhea    Medications Reviewed Today     Reviewed by Edythe Clarity, [facility] (Pharmacist) on 05/15/22 at 1446  Med List Status: <None>   Medication Order Taking? Sig Documenting Provider Last Dose Status Informant  acetaminophen (TYLENOL) 500 MG tablet [id number] Yes Take 500 mg by mouth every 6 (six) hours as needed for headache. [provider] Taking Active Self  albuterol (VENTOLIN HFA) 108 (90 Base) MCG/ACT inhaler [id number]  Inhale 2 puffs into the lungs every 6 (six) hours as needed for wheezing or shortness of breath. Tanda Rockers, MD  Active Self  albuterol (VENTOLIN HFA) 108 (90 Base) MCG/ACT inhaler [id number] Yes Inhale 2 puffs into the lungs every 6 (six) hours as needed for wheezing or shortness of breath. [name], MD Taking Active   aspirin EC 81 MG EC tablet  [id number] Yes Take 1 tablet (81 mg total) by mouth daily at 6 (six) AM. Swallow whole. Gabriel Earing, PA-C Taking Active Self  busPIRone (BUSPAR) 7.5 MG tablet [id number] Yes Take 1 tablet (7.5 mg total) by mouth 2 (two) times daily. [name], MD Taking Active   clopidogrel (PLAVIX) 75 MG tablet [id number]  Take 75 mg by mouth daily. [provider]  Active Self  clopidogrel (PLAVIX) 75 MG tablet [id number] Yes Take 1 tablet (75 mg total) by mouth daily. [name], MD Taking Active   ipratropium-albuterol (DUONEB) 0.5-2.5 (3) MG/3ML Bailey Mech [id number] Yes TAKE THREE MLS VIA NEBULIZER EVERY 6 HOURS AS NEEDED  Patient taking differently: Inhale 3 mLs into the lungs every 6 (six) hours as needed (shortness of breath).   Tanda Rockers, MD Taking Active Self  naloxone [facility]) nasal spray 4 mg/0.1 mL [id number] Yes SMARTSIG:In Nostril [provider] Taking Active Self  oxyCODONE (ROXICODONE) 15 MG immediate release tablet [id number] Yes Take 1 tablet (15 mg total) by mouth every 6 (six) hours as needed for pain. Dagoberto Ligas, PA-C Taking Active   predniSONE (DELTASONE) 10 MG tablet [id number] Yes 3 tabs x3 days and then 2 tabs x3 days and then 1 tab x3 days.  Take w/ food. [name], MD Taking Active   rosuvastatin (CRESTOR) 20 MG tablet [id number] Yes Take 1 tablet (20 mg total) by mouth daily. [name], MD Taking Active   sertraline (ZOLOFT) 25 MG tablet [id number] Yes TAKE ONE TABLET BY MOUTH ONCE DAILY [name], MD Taking Active   sertraline (ZOLOFT) 25 MG tablet [id number]  Take 1 tablet (25 mg total) by mouth daily. [name], MD  Active             Patient Active Problem List   Diagnosis Date Noted   Superficial foreign body of left leg without major open wound and without infection 07/05/2021   Atherosclerosis of artery of extremity with rest pain ([place]) 12/07/2020   Atherosclerosis of left lower extremity with  rest pain ([place]) 12/06/2020   DOE (dyspnea on exertion) 09/06/2019   Chronic respiratory  failure with hypoxia ([place]) 02/05/2019   Acute on chronic respiratory failure with hypoxia ([place]) 12/17/2018   Hyperlipidemia 02/12/2017   Physical exam 08/14/2016   Femoral-popliteal bypass graft occlusion, left (HCC) 05/21/2016   Allergic rhinitis 05/13/2016   Protein-calorie malnutrition, severe ([place]) 04/08/2016   Pain in joint, lower leg 10/17/2014   Encounter for post surgical wound check 12/09/2013   Peripheral vascular disease, unspecified ([place]) 11/08/2013   Leg edema, left, foot 06/22/2013   Cellulitis of foot, left 06/09/2013   Non-healing wound of lower extremity, lt great toe 06/09/2013   Claudication, lifestyle limiting 05/11/2013   PAD (peripheral artery disease), PTA/stent IDEV & chocolate baloon 06/08/13, Previous PTA/Stent to Rt SFA 05/10/13 04/13/2013   COPD III 04/13/2013   HTN (hypertension) 04/13/2013   Anxiety 04/13/2013   Low back pain 04/13/2013    Immunization History  Administered Date(s) Administered   Fluad Quad(high Dose 65+) 04/15/2019, 07/07/2021   Influenza Whole 06/10/2016   Influenza, High Dose Seasonal PF 05/11/2014, 06/16/2018   Influenza-Unspecified 05/19/2013, 06/16/2018, 05/15/2020, 07/10/2021   PFIZER(Purple Top)SARS-COV-2 Vaccination 10/02/2019, 10/25/2019   Pneumococcal Conjugate-13 05/13/2016    Conditions to be addressed/monitored:  Hypertension, Hyperlipidemia, and COPD  Care Plan : General Pharmacy (Adult)  Updates made by Edythe Clarity, RPH since 05/15/2022 12:00 AM     Problem: COPD, HLD   Priority: High  Onset Date: 05/15/2022     Long-Range Goal: Patient-Specific Goal   Start Date: 05/15/2022  Expected End Date: 11/13/2022  This Visit's Progress: On track  Priority: High  Note:   Current Barriers:  Suboptimal therapeutic regimen for COPD  Pharmacist Clinical Goal(s):  Patient will adhere to plan to optimize therapeutic regimen  for COPD as evidenced by report of adherence to recommended medication management changes through collaboration with PharmD and provider.   Interventions: 1:1 collaboration with [name], MD regarding development and update of comprehensive plan of care as evidenced by provider attestation and co-signature Inter-disciplinary care team collaboration (see longitudinal plan of care) Comprehensive medication review performed; medication list updated in electronic medical record   Hyperlipidemia: (LDL goal < 100) 05/15/22 -Controlled -Current treatment: Rosuvastatin 75m Appropriate, Effective, Safe, Accessible -Medications previously tried: none noted -Educated on Cholesterol goals;  Benefits of statin for ASCVD risk reduction; -Recommended to continue current medication  COPD (Goal: control symptoms and prevent exacerbations) 05/15/22 -Not ideally controlled -Current treatment  Albuterol HFA 987m prn Query appropriate -Medications previously tried: Pulmicort, Breo  -Current COPD Classification:  Group B -MMRC/CAT score: > 10 CAT score based off of discussion today -Pulmonary function testing: none recent -Exacerbations requiring treatment in last 6 months: none -Patient denies consistent use of maintenance inhaler -He is on Oxygen 100% of the time -Frequency of rescue inhaler use: once or twwce per day  TLC  Date Value Ref Range Status  06/18/2016 6.54 L Preliminary    -Counseled on [place] inhaler technique; Benefits of consistent maintenance inhaler use When to use rescue inhaler Differences between maintenance and rescue inhalers -Based on new treatment guidelines he would be candidate for LABA/LAMA - we could try to get him approved for Bevespi through [employer] which is free. Consult with pulmonology - patient willing to try.  Patient Goals/Self-Care Activities Patient will:  - collaborate with provider on medication access solutions  Follow Up Plan: The care  management team will reach out to the patient again over the next 90 days.       Medication Assistance: None required.  Patient affirms  current coverage meets needs.  Compliance/Adherence/Medication fill history: Care Gaps: Colonoscopy  Star-Rating Drugs: Rosuvastatin 57m 03/23/22 90ds  Patient[facility]: [phone]: [phone] [facility] [place] [place]Phone: [phone]: [phone] [id number] [place], [place]: [phone]: [phone] We discussed: Benefits of medication synchronization, packaging and delivery as well as enhanced pharmacist oversight with [facility]. Patient decided to: Continue current medication management strategy  Care Plan and Follow Up Patient Decision:  Patient agrees to Care Plan and Follow-up.  Plan: The care management team will reach out to the patient again over the next 180 days.  ChBeverly MilchPharmD Clinical Pharmacist  [phone]

## 2022-05-15 ENCOUNTER — Ambulatory Visit (INDEPENDENT_AMBULATORY_CARE_PROVIDER_SITE_OTHER): Payer: Medicare Other | Admitting: Pharmacist

## 2022-05-15 DIAGNOSIS — J449 Chronic obstructive pulmonary disease, unspecified: Secondary | ICD-10-CM

## 2022-05-15 DIAGNOSIS — E785 Hyperlipidemia, unspecified: Secondary | ICD-10-CM

## 2022-05-15 NOTE — Patient Instructions (Addendum)
Visit Information   Goals Addressed             This Visit's Progress    Track and Manage My Symptoms-COPD       Timeframe:  Long-Range Goal Priority:  High Start Date:      05/15/22                       Expected End Date:    11/12/21                   Follow Up Date 08/14/22    - eliminate symptom triggers at home - follow rescue plan if symptoms flare-up - keep follow-up appointments    Why is this important?   Tracking your symptoms and other information about your health helps your doctor plan your care.  Write down the symptoms, the time of day, what you were doing and what medicine you are taking.  You will soon learn how to manage your symptoms.     Notes:        Patient Care Plan: General Pharmacy (Adult)     Problem Identified: COPD, HLD   Priority: High  Onset Date: 05/15/2022     Long-Range Goal: Patient-Specific Goal   Start Date: 05/15/2022  Expected End Date: 11/13/2022  This Visit's Progress: On track  Priority: High  Note:   Current Barriers:  Suboptimal therapeutic regimen for COPD  Pharmacist Clinical Goal(s):  Patient will adhere to plan to optimize therapeutic regimen for COPD as evidenced by report of adherence to recommended medication management changes through collaboration with PharmD and provider.   Interventions: 1:1 collaboration with [name], MD regarding development and update of comprehensive plan of care as evidenced by provider attestation and co-signature Inter-disciplinary care team collaboration (see longitudinal plan of care) Comprehensive medication review performed; medication list updated in electronic medical record   Hyperlipidemia: (LDL goal < 100) 05/15/22 -Controlled -Current treatment: Rosuvastatin 20mg  Appropriate, Effective, Safe, Accessible -Medications previously tried: none noted -Educated on Cholesterol goals;  Benefits of statin for ASCVD risk reduction; -Recommended to continue current  medication  COPD (Goal: control symptoms and prevent exacerbations) 05/15/22 -Not ideally controlled -Current treatment  Albuterol HFA 64mcg prn Query appropriate -Medications previously tried: Pulmicort, Breo  -Current COPD Classification:  Group B -MMRC/CAT score: > 10 CAT score based off of discussion today -Pulmonary function testing: none recent -Exacerbations requiring treatment in last 6 months: none -Patient denies consistent use of maintenance inhaler -He is on Oxygen 100% of the time -Frequency of rescue inhaler use: once or twwce per day  TLC  Date Value Ref Range Status  06/18/2016 6.54 L Preliminary    -Counseled on Proper inhaler technique; Benefits of consistent maintenance inhaler use When to use rescue inhaler Differences between maintenance and rescue inhalers -Based on new treatment guidelines he would be candidate for LABA/LAMA - we could try to get him approved for Bevespi through [employer] which is free. Consult with pulmonology - patient willing to try.  Patient Goals/Self-Care Activities Patient will:  - collaborate with provider on medication access solutions  Follow Up Plan: The care management team will reach out to the patient again over the next 90 days.       The patient verbalized understanding of instructions, educational materials, and care plan provided today and DECLINED offer to receive copy of patient instructions, educational materials, and care plan.  Telephone follow up appointment with pharmacy team member scheduled  for: 6 months  Edythe Clarity, [place], PharmD Clinical Pharmacist  [facility] [phone]

## 2022-05-18 DIAGNOSIS — E785 Hyperlipidemia, unspecified: Secondary | ICD-10-CM | POA: Diagnosis not present

## 2022-05-18 DIAGNOSIS — J449 Chronic obstructive pulmonary disease, unspecified: Secondary | ICD-10-CM

## 2022-05-21 DIAGNOSIS — M79604 Pain in right leg: Secondary | ICD-10-CM | POA: Diagnosis not present

## 2022-05-21 DIAGNOSIS — M79605 Pain in left leg: Secondary | ICD-10-CM | POA: Diagnosis not present

## 2022-05-21 DIAGNOSIS — G890 Central pain syndrome: Secondary | ICD-10-CM | POA: Diagnosis not present

## 2022-05-21 DIAGNOSIS — M5432 Sciatica, left side: Secondary | ICD-10-CM | POA: Diagnosis not present

## 2022-05-21 DIAGNOSIS — G894 Chronic pain syndrome: Secondary | ICD-10-CM | POA: Diagnosis not present

## 2022-05-21 DIAGNOSIS — M5136 Other intervertebral disc degeneration, lumbar region: Secondary | ICD-10-CM | POA: Diagnosis not present

## 2022-05-21 DIAGNOSIS — M545 Low back pain, unspecified: Secondary | ICD-10-CM | POA: Diagnosis not present

## 2022-05-21 DIAGNOSIS — Z79891 Long term (current) use of opiate analgesic: Secondary | ICD-10-CM | POA: Diagnosis not present

## 2022-05-21 DIAGNOSIS — M5431 Sciatica, right side: Secondary | ICD-10-CM | POA: Diagnosis not present

## 2022-05-24 ENCOUNTER — Other Ambulatory Visit: Payer: Self-pay | Admitting: Family Medicine

## 2022-05-24 DIAGNOSIS — J9611 Chronic respiratory failure with hypoxia: Secondary | ICD-10-CM

## 2022-06-20 DIAGNOSIS — G894 Chronic pain syndrome: Secondary | ICD-10-CM | POA: Diagnosis not present

## 2022-06-20 DIAGNOSIS — M79605 Pain in left leg: Secondary | ICD-10-CM | POA: Diagnosis not present

## 2022-06-20 DIAGNOSIS — M5136 Other intervertebral disc degeneration, lumbar region: Secondary | ICD-10-CM | POA: Diagnosis not present

## 2022-06-20 DIAGNOSIS — M5432 Sciatica, left side: Secondary | ICD-10-CM | POA: Diagnosis not present

## 2022-06-20 DIAGNOSIS — M545 Low back pain, unspecified: Secondary | ICD-10-CM | POA: Diagnosis not present

## 2022-06-20 DIAGNOSIS — Z79891 Long term (current) use of opiate analgesic: Secondary | ICD-10-CM | POA: Diagnosis not present

## 2022-06-20 DIAGNOSIS — M5431 Sciatica, right side: Secondary | ICD-10-CM | POA: Diagnosis not present

## 2022-06-20 DIAGNOSIS — G890 Central pain syndrome: Secondary | ICD-10-CM | POA: Diagnosis not present

## 2022-06-20 DIAGNOSIS — M79604 Pain in right leg: Secondary | ICD-10-CM | POA: Diagnosis not present

## 2022-06-27 ENCOUNTER — Telehealth: Payer: Self-pay | Admitting: Pharmacist

## 2022-06-27 NOTE — Progress Notes (Addendum)
Chronic Care Management Pharmacy Assistant   Name: Christopher Pena  MRN: [id number] DOB: 04/03/1946   Reason for Encounter: Monthly Medication Coordination Call      Medications: Outpatient Encounter Medications as of 06/27/2022  Medication Sig   acetaminophen (TYLENOL) 500 MG tablet Take 500 mg by mouth every 6 (six) hours as needed for headache.   albuterol (VENTOLIN HFA) 108 (90 Base) MCG/ACT inhaler Inhale 2 puffs into the lungs every 6 (six) hours as needed for wheezing or shortness of breath.   albuterol (VENTOLIN HFA) 108 (90 Base) MCG/ACT inhaler INHALE TWO PUFFS BY MOUTH INTO LUNGS every SIX hours AS NEEDED FOR WHEEZING FOR SHORTNESS OF BREATH   aspirin EC 81 MG EC tablet Take 1 tablet (81 mg total) by mouth daily at 6 (six) AM. Swallow whole.   busPIRone (BUSPAR) 7.5 MG tablet Take 1 tablet (7.5 mg total) by mouth 2 (two) times daily.   clopidogrel (PLAVIX) 75 MG tablet Take 75 mg by mouth daily.   clopidogrel (PLAVIX) 75 MG tablet Take 1 tablet (75 mg total) by mouth daily.   ipratropium-albuterol (DUONEB) 0.5-2.5 (3) MG/3ML SOLN TAKE THREE MLS VIA NEBULIZER EVERY 6 HOURS AS NEEDED (Patient taking differently: Inhale 3 mLs into the lungs every 6 (six) hours as needed (shortness of breath).)   naloxone (NARCAN) nasal spray 4 mg/0.1 mL SMARTSIG:In Nostril   oxyCODONE (ROXICODONE) 15 MG immediate release tablet Take 1 tablet (15 mg total) by mouth every 6 (six) hours as needed for pain.   predniSONE (DELTASONE) 10 MG tablet 3 tabs x3 days and then 2 tabs x3 days and then 1 tab x3 days.  Take w/ food.   rosuvastatin (CRESTOR) 20 MG tablet Take 1 tablet (20 mg total) by mouth daily.   sertraline (ZOLOFT) 25 MG tablet TAKE ONE TABLET BY MOUTH ONCE DAILY   sertraline (ZOLOFT) 25 MG tablet Take 1 tablet (25 mg total) by mouth daily.   No facility-administered encounter medications on file as of 06/27/2022.    Reviewed chart for medication changes ahead of medication coordination  call.  No OVs, Consults, or hospital visits since last care coordination call/Pharmacist visit. (If appropriate, list visit date, provider name)  No medication changes indicated OR if recent visit, treatment plan here.  BP Readings from Last 3 Encounters:  01/02/22 116/80  09/17/21 (!) 144/75  08/22/21 (!) 143/85    Lab Results  Component Value Date   HGBA1C 5.8 (H) 07/06/2021     Patient obtains medications through Vials  90 Days   Last adherence delivery included: (medication name and frequency)  Sertraline 25 mg 1 tablet daily Clopidogrel 75 mg 1 tablet daily  Docusate 100 mg 1 capsule daily Rosuvastatin 20 mg 1 tablet daily Buspirone 7.5mg  1 tablet twice daily     Patient is due for next adherence delivery on: 07/08/22. Called patient and reviewed medications and coordinated delivery.  This delivery to include:  Sertraline 25 mg 1 tablet daily Clopidogrel 75 mg 1 tablet daily  Docusate 100 mg 1 capsule daily Rosuvastatin 20 mg 1 tablet daily Buspirone 7.5mg  1 tablet twice daily     Patient needs refills for 90 days from PCP Clopidogrel 75 mg 1 tablet daily    Confirmed delivery date of 07/08/22, advised patient that pharmacy will contact them the morning of delivery.   Care Gaps   AWV: overdue Colonoscopy: unknown DM Eye Exam: N/A DM Foot Exam: N/A Microalbumin: N/A HbgAIC: N/A DEXA: N/A Mammogram: N/A  Star Rating Drugs: Rosuvastatin (CRESTOR) 20 MG tablet - last filled 06/14/22 23 days    Future Appointments  Date Time Provider Department Center  07/03/2022 12:40 PM Sheliah Hatch, MD [facility]  11/20/2022  3:45 PM [facility] PHARMACIST [facility]   Patient uses [facility] for assistance with delivery due to requiring help with transportation, housework, and groceries.   Eugenie Filler, [facility] Clinical Pharmacist Assistant  [phone]

## 2022-07-01 ENCOUNTER — Other Ambulatory Visit: Payer: Self-pay | Admitting: Family Medicine

## 2022-07-01 DIAGNOSIS — I739 Peripheral vascular disease, unspecified: Secondary | ICD-10-CM

## 2022-07-03 ENCOUNTER — Telehealth: Payer: Self-pay | Admitting: Internal Medicine

## 2022-07-03 ENCOUNTER — Encounter: Payer: Self-pay | Admitting: Family Medicine

## 2022-07-03 ENCOUNTER — Ambulatory Visit (INDEPENDENT_AMBULATORY_CARE_PROVIDER_SITE_OTHER): Payer: Medicare Other | Admitting: Family Medicine

## 2022-07-03 VITALS — BP 116/60 | HR 116 | Temp 98.2°F | Resp 20 | Ht 67.0 in | Wt 153.4 lb

## 2022-07-03 DIAGNOSIS — Z0000 Encounter for general adult medical examination without abnormal findings: Secondary | ICD-10-CM | POA: Diagnosis not present

## 2022-07-03 DIAGNOSIS — E785 Hyperlipidemia, unspecified: Secondary | ICD-10-CM

## 2022-07-03 DIAGNOSIS — J9611 Chronic respiratory failure with hypoxia: Secondary | ICD-10-CM | POA: Diagnosis not present

## 2022-07-03 LAB — CBC WITH DIFFERENTIAL/PLATELET
Basophils Absolute: 0.2 10*3/uL — ABNORMAL HIGH (ref 0.0–0.1)
Basophils Relative: 1.5 % (ref 0.0–3.0)
Eosinophils Absolute: 0.4 10*3/uL (ref 0.0–0.7)
Eosinophils Relative: 3.2 % (ref 0.0–5.0)
HCT: 42 % (ref 39.0–52.0)
Hemoglobin: 13.7 g/dL (ref 13.0–17.0)
Lymphocytes Relative: 8.3 % — ABNORMAL LOW (ref 12.0–46.0)
Lymphs Abs: 0.9 10*3/uL (ref 0.7–4.0)
MCHC: 32.6 g/dL (ref 30.0–36.0)
MCV: 88.2 fl (ref 78.0–100.0)
Monocytes Absolute: 0.8 10*3/uL (ref 0.1–1.0)
Monocytes Relative: 7.2 % (ref 3.0–12.0)
Neutro Abs: 8.8 10*3/uL — ABNORMAL HIGH (ref 1.4–7.7)
Neutrophils Relative %: 79.8 % — ABNORMAL HIGH (ref 43.0–77.0)
Platelets: 339 10*3/uL (ref 150.0–400.0)
RBC: 4.77 Mil/uL (ref 4.22–5.81)
RDW: 14.9 % (ref 11.5–15.5)
WBC: 11 10*3/uL — ABNORMAL HIGH (ref 4.0–10.5)

## 2022-07-03 LAB — HEPATIC FUNCTION PANEL
ALT: 8 U/L (ref 0–53)
AST: 19 U/L (ref 0–37)
Albumin: 4 g/dL (ref 3.5–5.2)
Alkaline Phosphatase: 67 U/L (ref 39–117)
Bilirubin, Direct: 0.1 mg/dL (ref 0.0–0.3)
Total Bilirubin: 0.4 mg/dL (ref 0.2–1.2)
Total Protein: 7.9 g/dL (ref 6.0–8.3)

## 2022-07-03 LAB — BASIC METABOLIC PANEL
BUN: 16 mg/dL (ref 6–23)
CO2: 33 mEq/L — ABNORMAL HIGH (ref 19–32)
Calcium: 9 mg/dL (ref 8.4–10.5)
Chloride: 99 mEq/L (ref 96–112)
Creatinine, Ser: 0.98 mg/dL (ref 0.40–1.50)
GFR: 75.15 mL/min (ref >60.00)
Glucose, Bld: 178 mg/dL — ABNORMAL HIGH (ref 70–99)
Potassium: 4.3 mEq/L (ref 3.5–5.1)
Sodium: 138 mEq/L (ref 135–145)

## 2022-07-03 LAB — LIPID PANEL
Cholesterol: 140 mg/dL (ref 0–200)
HDL: 68.6 mg/dL (ref >39.00)
LDL Cholesterol: 46 mg/dL (ref 0–99)
NonHDL: 71.83
Total CHOL/HDL Ratio: 2
Triglycerides: 130 mg/dL (ref 0.0–149.0)
VLDL: 26 mg/dL (ref 0.0–40.0)

## 2022-07-03 LAB — TSH: TSH: 2.24 u[IU]/mL (ref 0.35–5.50)

## 2022-07-03 NOTE — Patient Instructions (Signed)
Follow up in 6 months to recheck cholesterol We'll notify you of your lab results and make any changes if needed We'll call you to schedule your new pulmonary appt Call with any questions or concerns Stay Safe!  Stay Healthy! Happy Holidays!!!

## 2022-07-03 NOTE — Progress Notes (Signed)
   Subjective:    Patient ID: Christopher Pena, male    DOB: 04-Aug-1946, 76 y.o.   MRN: [id number]  HPI CPE- UTD on flu, PNA.  Not a candidate for colonoscopy due to pulmonary issues.  Patient Care Team    Relationship Specialty Notifications Start End  Sheliah Hatch, MD PCP - General Family Medicine  04/08/16   Runell Gess, MD Consulting Physician Cardiology  02/21/14   [facility]  Pain Medicine  12/04/15    Comment: Dr. Jeanne Ivan, Fran Lowes, MD Consulting Physician Vascular Surgery  04/08/16   Erroll Luna, [facility]  Pharmacist  05/15/22    Comment: [phone]    Health Maintenance  Topic Date Due   Medicare Annual Wellness (AWV)  02/28/2023   INFLUENZA VACCINE  Completed   HPV VACCINES  Aged Out   Pneumonia Vaccine 57+ Years old  Discontinued   TETANUS/TDAP  Discontinued   COVID-19 Vaccine  Discontinued   Hepatitis C Screening  Discontinued   Zoster Vaccines- Shingrix  Discontinued      Review of Systems Patient reports no vision/hearing changes, anorexia, fever ,adenopathy, persistant/recurrent hoarseness, chest pain, palpitations, edema, hemoptysis, gastrointestinal  bleeding (melena, rectal bleeding), abdominal pain, excessive heart burn, GU symptoms (dysuria, hematuria, voiding/incontinence issues) syncope, focal weakness, memory loss, numbness & tingling, skin/hair/nail changes, depression, anxiety, abnormal bruising/bleeding, musculoskeletal symptoms/signs.   + chronic dyspnea- on O2 continuously due to chronic hypoxia + chronic cough + pill dysphagia- pt not interested in referral.    Objective:   Physical Exam General Appearance:    Alert, cooperative, appears stated age, pale, nasal cannula in place  Head:    Normocephalic, without obvious abnormality, atraumatic  Eyes:    PERRL, conjunctiva/corneas clear, EOM's intact both eyes       Ears:    Normal TM's and external ear canals, both ears  Nose:   Nares normal, septum midline, mucosa  normal, no drainage   or sinus tenderness  Throat:   Lips, mucosa, and tongue normal; gums normal, dentures  Neck:   Supple, symmetrical, trachea midline, no adenopathy;       thyroid:  No enlargement/tenderness/nodules  Back:     Symmetric, no curvature, ROM normal, no CVA tenderness  Lungs:     Clear to auscultation bilaterally, respirations unlabored  Chest wall:    No tenderness or deformity  Heart:    Regular rate and rhythm, S1 and S2 normal, no murmur, rub   or gallop  Abdomen:     Soft, non-tender, bowel sounds active all four quadrants,    no masses, no organomegaly  Genitalia:    deferred  Rectal:    Extremities:   Extremities normal, atraumatic, no cyanosis or edema  Pulses:   Decreased distal LE pulses bilaterally  Skin:   Skin texture, turgor normal, no rashes or lesions  Lymph nodes:   Cervical, supraclavicular, and axillary nodes normal  Neurologic:   CNII-XII intact. Normal strength, sensation and reflexes      throughout          Assessment & Plan:

## 2022-07-03 NOTE — Assessment & Plan Note (Signed)
Pt's PE WNL w/ exception of O2 via [place], distant heart sounds, and decreased distal pulses.  UTD on flu, PNA.  No longer a candidate for colon cancer screening due to pulmonary issues.  Check labs.  Anticipatory guidance provided.

## 2022-07-03 NOTE — Assessment & Plan Note (Signed)
Pt is asking to have new pulmonologist based on previous appts.  Referral placed

## 2022-07-04 ENCOUNTER — Telehealth: Payer: Self-pay

## 2022-07-04 ENCOUNTER — Other Ambulatory Visit: Payer: Self-pay

## 2022-07-04 ENCOUNTER — Other Ambulatory Visit (INDEPENDENT_AMBULATORY_CARE_PROVIDER_SITE_OTHER): Payer: Medicare Other

## 2022-07-04 DIAGNOSIS — Z131 Encounter for screening for diabetes mellitus: Secondary | ICD-10-CM

## 2022-07-04 LAB — HEMOGLOBIN A1C: Hgb A1c MFr Bld: 6.1 % (ref 4.6–6.5)

## 2022-07-04 NOTE — Telephone Encounter (Signed)
ATC pt LVMTCB please ask pt what provider he would like to switch to .

## 2022-07-04 NOTE — Telephone Encounter (Signed)
Informed pt of lab result 

## 2022-07-04 NOTE — Telephone Encounter (Signed)
-----   Message from Sheliah Hatch, MD sent at 07/04/2022 12:31 PM EST ----- Thankfully you are in the pre-diabetes range and do not have diabetes at this time.

## 2022-07-08 ENCOUNTER — Other Ambulatory Visit: Payer: Self-pay | Admitting: *Deleted

## 2022-07-08 DIAGNOSIS — I779 Disorder of arteries and arterioles, unspecified: Secondary | ICD-10-CM

## 2022-07-12 DIAGNOSIS — M5432 Sciatica, left side: Secondary | ICD-10-CM | POA: Diagnosis not present

## 2022-07-12 DIAGNOSIS — M5136 Other intervertebral disc degeneration, lumbar region: Secondary | ICD-10-CM | POA: Diagnosis not present

## 2022-07-12 DIAGNOSIS — M79604 Pain in right leg: Secondary | ICD-10-CM | POA: Diagnosis not present

## 2022-07-12 DIAGNOSIS — M5431 Sciatica, right side: Secondary | ICD-10-CM | POA: Diagnosis not present

## 2022-07-12 DIAGNOSIS — M79605 Pain in left leg: Secondary | ICD-10-CM | POA: Diagnosis not present

## 2022-07-12 DIAGNOSIS — M545 Low back pain, unspecified: Secondary | ICD-10-CM | POA: Diagnosis not present

## 2022-07-12 DIAGNOSIS — G890 Central pain syndrome: Secondary | ICD-10-CM | POA: Diagnosis not present

## 2022-07-12 DIAGNOSIS — Z79891 Long term (current) use of opiate analgesic: Secondary | ICD-10-CM | POA: Diagnosis not present

## 2022-07-12 DIAGNOSIS — G894 Chronic pain syndrome: Secondary | ICD-10-CM | POA: Diagnosis not present

## 2022-07-18 NOTE — Progress Notes (Unsigned)
Office Note     CC:  follow up Requesting Provider:  Sheliah Hatch, MD  HPI: Christopher Pena is a 76 y.o. (1945-08-31) male who presents for follow up of PAD.  He has had several left lower extremity interventions. He most recently is underwent excision of left lower extremity PTFE bypass due to infection by Dr. Karin Lieu on 07/05/21.   His bypass graft was chronically occluded.  He also required subsequent  I & D with incisional closures on 07/10/21. He had a previous  left femoral thrombectomy with left iliofemoral endarterectomy with bovine pericardial patch angioplasty by Dr. Myra Gianotti on 12/08/2020.  When he was seen last for post operative follow up in January of 2022, his incisions were healing very nicely.   Today he denies short distance claudication, rest pain or non healing wound.  His ambulation is limited severely by COPD and home oxygen needs.     His other interventions are listed as follows: 09/20/16 #1  Left femoral to anterior tibial bypass with 6mm ringed Propatent PTFE  #2:  Re-do exposure of left femoral and anterior tibial arteries by Dr. Myra Gianotti 05/22/16 Arteriogram with balloon angioplasty distal anastomosis and AT artery with 3mm balloon, Balloon angioplasty vein bypass with 4mm balloon, Balloon angioplasty proximal anastomosis with 5mm balloon by Dr. Randie Heinz 05/21/16 #1: Thrombectomy of left femoral anterior tibial bypass graft #2: Balloon angioplasty, left femoral anterior tibial bypass graft 12/08/15 Left Femoral to Anterior tibial bypass with ipsilateral non-reversed greater saphenous vein by Dr. Myra Gianotti 11/12/13 Right femoral to below knee popliteal artery bypass graft using ipsilateral non-reversed translocated vein by Dr. Myra Gianotti   The pt is on a statin for cholesterol management.  The pt is on a daily aspirin.   Other AC:  Plavix The pt is not on medication for hypertension.   The pt is not diabetic.   Tobacco hx:  Former, 2018  Past Medical History:  Diagnosis Date    Anxiety    Chronic low back pain    "degenerative spine dx'd ~ 7 yr ago" (06/08/2013)   Claudication [facility])    Constipation due to pain medication    COPD (chronic obstructive pulmonary disease) (HCC)    Hypertension    "moderately high; RX didn't help" (06/08/2013) - no longer on meds (as of 09/19/16)   Neuromuscular disorder (HCC)    degen spine   Non-healing wound of lower extremity 06/09/2013   PAD (peripheral artery disease), PTA/stent IDEV & chocolate baloon 06/08/13 04/13/2013   Severely reduced ABI's of 0.3 bilaterally    Tobacco use 06/09/2013   Unexplained weight loss     Past Surgical History:  Procedure Laterality Date   ABDOMINAL AORTAGRAM  05/10/2013   Procedure: ABDOMINAL AORTAGRAM;  Surgeon: Runell Gess, MD;  Location: [facility] CATH LAB;  Service: Cardiovascular;;   ABI     04/09/13; abnormal   ANGIOPLASTY Left 05/21/2016   Procedure: ballon ANGIOPLASTY of femoral to anterior to tibial bypass graft;  Surgeon: Nada Libman, MD;  Location: [facility] OR;  Service: Vascular;  Laterality: Left;   APPENDECTOMY  05/2001   ENDARTERECTOMY FEMORAL Left 12/08/2020   Procedure: REDO LEFT FEMORAL ARTERY ENDARTERECTOMY, LEFT FEMORAL ARTERY THROMBECTOMY;  Surgeon: Nada Libman, MD;  Location: [facility] OR;  Service: Vascular;  Laterality: Left;   FEMORAL ARTERY STENT Right 05/10/13   2 stents Rt SFA   FEMORAL ARTERY STENT Left 06/08/2013   FEMORAL-POPLITEAL BYPASS GRAFT Right 11/12/2013   Procedure: RIGHT  FEMORAL-BELOW KNEE POPLITEAL ARTERY  BYPASS GRAFT USING NON- REVERSE RIGHT GREATER SAPHENOUS VEIN.;  Surgeon: Nada Libman, MD;  Location: [facility] OR;  Service: Vascular;  Laterality: Right;   FEMORAL-TIBIAL BYPASS GRAFT Left 12/08/2015   Procedure:  LEFT FEMORAL-ANTERIOR TIBIAL ARTERY BYPASS GRAFT;  Surgeon: Nada Libman, MD;  Location: [facility] OR;  Service: Vascular;  Laterality: Left;   FEMORAL-TIBIAL BYPASS GRAFT Left 09/20/2016   Procedure: REDO BYPASS GRAFT FEMORAL-ANTERIOR TIBIAL ARTERY;   Surgeon: Nada Libman, MD;  Location: [facility] OR;  Service: Vascular;  Laterality: Left;   I & D EXTREMITY Left 07/05/2021   Procedure: IRRIGATION AND DEBRIDEMENT LEFT LOWER EXTREMITY;  Surgeon: Victorino Sparrow, MD;  Location: [facility] OR;  Service: Vascular;  Laterality: Left;   I & D EXTREMITY Left 07/10/2021   Procedure: IRRIGATION AND DEBRIDEMENT LEFT LEG WOUND;  Surgeon: Victorino Sparrow, MD;  Location: [facility] OR;  Service: Vascular;  Laterality: Left;   LOWER EXTREMITY ANGIOGRAM Bilateral 05/10/2013   Procedure: LOWER EXTREMITY ANGIOGRAM;  Surgeon: Runell Gess, MD;  Location: [facility] CATH LAB;  Service: Cardiovascular;  Laterality: Bilateral;   LOWER EXTREMITY ANGIOGRAM N/A 06/08/2013   Procedure: LOWER EXTREMITY ANGIOGRAM;  Surgeon: Runell Gess, MD;  Location: [facility] CATH LAB;  Service: Cardiovascular;  Laterality: N/A;   LOWER EXTREMITY ANGIOGRAM N/A 10/28/2013   Procedure: LOWER EXTREMITY ANGIOGRAM;  Surgeon: Runell Gess, MD;  Location: [facility] CATH LAB;  Service: Cardiovascular;  Laterality: N/A;   LOWER EXTREMITY ANGIOGRAM N/A 05/16/2014   Procedure: LOWER EXTREMITY ANGIOGRAM;  Surgeon: Runell Gess, MD;  Location: [facility] CATH LAB;  Service: Cardiovascular;  Laterality: N/A;   PATCH ANGIOPLASTY Left 12/08/2020   Procedure: WITH PATCH ANGIOPLASTY USING 1 X 6 CM XENOSURE BIOLOGIC PATCH;  Surgeon: Nada Libman, MD;  Location: [facility] OR;  Service: Vascular;  Laterality: Left;   PATCH ANGIOPLASTY Left 07/05/2021   Procedure: ANTERIOR TIBIAL ARTERY PATCH USING GREATER SAPHENOUS VEIN;  Surgeon: Victorino Sparrow, MD;  Location: [facility] OR;  Service: Vascular;  Laterality: Left;   PERCUTANEOUS STENT INTERVENTION Right 05/10/2013   Procedure: PERCUTANEOUS STENT INTERVENTION;  Surgeon: Runell Gess, MD;  Location: [facility] CATH LAB;  Service: Cardiovascular;  Laterality: Right;  rt sfa and popliteal stent x2   PERIPHERAL VASCULAR CATHETERIZATION N/A 12/06/2015   Procedure: Abdominal Aortogram w/Lower Extremity;   Surgeon: Nada Libman, MD;  [facility]: [facility] INVASIVE CV LAB;  Service: Cardiovascular;  Laterality: N/A;   PERIPHERAL VASCULAR CATHETERIZATION N/A 05/22/2016   Procedure: Abdominal Aortogram w/Lower Extremity;  Surgeon: Maeola Harman, MD;  Location: [facility] INVASIVE CV LAB;  Service: Cardiovascular;  Laterality: N/A;   PERIPHERAL VASCULAR CATHETERIZATION N/A 09/17/2016   Procedure: Abdominal Aortogram w/Lower Extremity;  Surgeon: Nada Libman, MD;  Location: [facility] INVASIVE CV LAB;  Service: Cardiovascular;  Laterality: N/A;   REMOVAL OF GRAFT Left 07/05/2021   Procedure: RESECTION OF LEFT LOWER EXTREMITY BYPASS GRAFT;  Surgeon: Victorino Sparrow, MD;  Location: [facility] OR;  Service: Vascular;  Laterality: Left;   THROMBECTOMY FEMORAL ARTERY Left 05/21/2016   Procedure: THROMBECTOMY FEMORAL TO ANTERIOR TIBIAL BYPASS GRAFT;  Surgeon: Nada Libman, MD;  Location: [facility] OR;  Service: Vascular;  Laterality: Left;   VEIN HARVEST Left 12/08/2015   Procedure: USING NON REVERSE  LEFT GREATER SAPHENOUS VEIN;  Surgeon: Nada Libman, MD;  Location: [facility] OR;  Service: Vascular;  Laterality: Left;   VEIN HARVEST Left 07/05/2021   Procedure: VEIN HARVEST LEFT GREATER SAPHENOUS;  Surgeon: Victorino Sparrow, MD;  Location: [facility] OR;  Service: Vascular;  Laterality: Left;    Social History   Socioeconomic History   Marital status: Divorced    Spouse name: Not on file   Number of children: Not on file   Years of education: Not on file   Highest education level: Not on file  Occupational History   Occupation: former golf pro  Tobacco Use   Smoking status: Former    Packs/day: 0.25    Years: 50.00    Total pack years: 12.50    Types: Cigarettes    Start date: 11/14/2015    Quit date: 11/13/2016    Years since quitting: 5.6   Smokeless tobacco: Never  Vaping Use   Vaping Use: Never used  Substance and Sexual Activity   Alcohol use: No    Alcohol/week: 0.0 standard drinks of alcohol    Comment: 06/08/2013  "quit 02/2007; drank my share before that though"   Drug use: No   Sexual activity: Yes  Other Topics Concern   Not on file  Social History Narrative   Not on file   Social Determinants of Health   Financial Resource Strain: Low Risk  (02/27/2022)   Overall Financial Resource Strain (CARDIA)    Difficulty of Paying Living Expenses: Not hard at all  Food Insecurity: No Food Insecurity (02/27/2022)   Hunger Vital Sign    Worried About Running Out of Food in the Last Year: Never true    Ran Out of Food in the Last Year: Never true  Transportation Needs: No Transportation Needs (02/27/2022)   PRAPARE - Administrator, Civil Service (Medical): No    Lack of Transportation (Non-Medical): No  Physical Activity: Inactive (02/27/2022)   Exercise Vital Sign    Days of Exercise per Week: 0 days    Minutes of Exercise per Session: 0 min  Stress: No Stress Concern Present (02/27/2022)   [employer] of Occupational Health - Occupational Stress Questionnaire    Feeling of Stress : Not at all  Social Connections: Socially Isolated (02/27/2022)   Social Connection and Isolation Panel [NHANES]    Frequency of Communication with Friends and Family: Twice a week    Frequency of Social Gatherings with Friends and Family: Twice a week    Attends Religious Services: Never    Database administrator or Organizations: No    Attends Banker Meetings: Never    Marital Status: Divorced  Catering manager Violence: Not At Risk (02/27/2022)   Humiliation, Afraid, Rape, and Kick questionnaire    Fear of Current or Ex-Partner: No    Emotionally Abused: No    Physically Abused: No    Sexually Abused: No   *** Family History  Problem Relation Age of Onset   Hyperlipidemia Mother    Heart disease Mother    Diabetes Mother    Cancer Father        Liver    Current Outpatient Medications  Medication Sig Dispense Refill   acetaminophen (TYLENOL) 500 MG tablet Take 500 mg by mouth  every 6 (six) hours as needed for headache.     albuterol (VENTOLIN HFA) 108 (90 Base) MCG/ACT inhaler INHALE TWO PUFFS BY MOUTH INTO LUNGS every SIX hours AS NEEDED FOR WHEEZING FOR SHORTNESS OF BREATH 8.5 g 6   aspirin EC 81 MG EC tablet Take 1 tablet (81 mg total) by mouth daily at 6 (six) AM. Swallow whole.     busPIRone (BUSPAR) 7.5 MG tablet  Take 1 tablet (7.5 mg total) by mouth 2 (two) times daily. 180 tablet 1   clopidogrel (PLAVIX) 75 MG tablet TAKE ONE TABLET BY MOUTH ONCE DAILY 90 tablet 1   ipratropium-albuterol (DUONEB) 0.5-2.5 (3) MG/3ML SOLN TAKE THREE MLS VIA NEBULIZER EVERY 6 HOURS AS NEEDED (Patient taking differently: Inhale 3 mLs into the lungs every 6 (six) hours as needed (shortness of breath).) 360 mL 0   naloxone (NARCAN) nasal spray 4 mg/0.1 mL SMARTSIG:In Nostril     oxyCODONE (ROXICODONE) 15 MG immediate release tablet Take 1 tablet (15 mg total) by mouth every 6 (six) hours as needed for pain. 20 tablet 0   predniSONE (DELTASONE) 10 MG tablet 3 tabs x3 days and then 2 tabs x3 days and then 1 tab x3 days.  Take w/ food. (Patient not taking: Reported on 07/03/2022) 18 tablet 0   rosuvastatin (CRESTOR) 20 MG tablet Take 1 tablet (20 mg total) by mouth daily. 90 tablet 1   sertraline (ZOLOFT) 25 MG tablet TAKE ONE TABLET BY MOUTH ONCE DAILY 30 tablet 3   No current facility-administered medications for this visit.    Allergies  Allergen Reactions   Clindamycin/Lincomycin Diarrhea     REVIEW OF SYSTEMS:  *** [X]  denotes positive finding, [ ]  denotes negative finding Cardiac  Comments:  Chest pain or chest pressure:    Shortness of breath upon exertion:    Short of breath when lying flat:    Irregular heart rhythm:        Vascular    Pain in calf, thigh, or hip brought on by ambulation:    Pain in feet at night that wakes you up from your sleep:     Blood clot in your veins:    Leg swelling:         Pulmonary    Oxygen at home:    Productive cough:      Wheezing:         Neurologic    Sudden weakness in arms or legs:     Sudden numbness in arms or legs:     Sudden onset of difficulty speaking or slurred speech:    Temporary loss of vision in one eye:     Problems with dizziness:         Gastrointestinal    Blood in stool:     Vomited blood:         Genitourinary    Burning when urinating:     Blood in urine:        Psychiatric    Major depression:         Hematologic    Bleeding problems:    Problems with blood clotting too easily:        Skin    Rashes or ulcers:        Constitutional    Fever or chills:      PHYSICAL EXAMINATION:  There were no vitals filed for this visit.  General:  WDWN in NAD; vital signs documented above Gait: Not observed HENT: WNL, normocephalic Pulmonary: normal non-labored breathing , without Rales, rhonchi,  wheezing Cardiac: {Desc; regular/irreg:14544} HR, without  Murmurs {With/Without:20273} carotid bruit*** Abdomen: soft, NT, no masses Skin: {With/Without:20273} rashes Vascular Exam/Pulses:  Right Left  Radial {Exam; arterial pulse strength 0-4:30167} {Exam; arterial pulse strength 0-4:30167}  Ulnar {Exam; arterial pulse strength 0-4:30167} {Exam; arterial pulse strength 0-4:30167}  Femoral {Exam; arterial pulse strength 0-4:30167} {Exam; arterial pulse strength 0-4:30167}  Popliteal {Exam; arterial pulse  strength 0-4:30167} {Exam; arterial pulse strength 0-4:30167}  DP {Exam; arterial pulse strength 0-4:30167} {Exam; arterial pulse strength 0-4:30167}  PT {Exam; arterial pulse strength 0-4:30167} {Exam; arterial pulse strength 0-4:30167}   Extremities: {With/Without:20273} ischemic changes, {With/Without:20273} Gangrene , {With/Without:20273} cellulitis; {With/Without:20273} open wounds;  Musculoskeletal: no muscle wasting or atrophy  Neurologic: A&O X 3;  No focal weakness or paresthesias are detected Psychiatric:  The pt has {Desc; normal/abnormal:11317::"Normal"}  affect.   Non-Invasive Vascular Imaging:   ***    ASSESSMENT/PLAN:: 76 y.o. male here for follow up for ***   -Has known AAA which will need to be evaluated in 1 year   Graceann Congressorrina Raylynn Hersh, PA-C [facility] [phone]  Clinic MD:   Myra GianottiBrabham

## 2022-07-22 ENCOUNTER — Ambulatory Visit (HOSPITAL_COMMUNITY): Payer: Medicare Other

## 2022-07-22 ENCOUNTER — Ambulatory Visit: Payer: Medicare Other

## 2022-07-22 DIAGNOSIS — I779 Disorder of arteries and arterioles, unspecified: Secondary | ICD-10-CM

## 2022-07-26 ENCOUNTER — Telehealth: Payer: Self-pay | Admitting: Family Medicine

## 2022-07-26 ENCOUNTER — Telehealth: Payer: Self-pay | Admitting: Internal Medicine

## 2022-07-26 MED ORDER — PREDNISONE 10 MG PO TABS: ORAL_TABLET | ORAL | 0 refills | Status: DC

## 2022-07-26 NOTE — Telephone Encounter (Signed)
Pt was contacted on 2 separate occasions by Pulmonary asking him who he wanted to switch to.  They stated he did not answer or return the call.  Per referral note- Pt has been contacted by our office to let us know who he wanted to switch to, but there has been no response. Closing referral, but will reopen if pt responds.  So he needs to return their call.  I will send in Prednisone for him since it's the weekend but if this doesn't improve his symptoms, he needs to be seen.  He can schedule an appt here or if we are closed, he needs to go to [facility] or ER.  He is asking for it to go to [facility] and I will send it there, but we need to make sure they will deliver it before the weekend.

## 2022-07-26 NOTE — Telephone Encounter (Signed)
Spoke with pt and advised he needs to call the Pulmonologist office and if symptoms become worse he will need to make an apt with our office or to the ER if we are closed . Pt expressed verbal understanding and I advised we will send in Prednisolone to [facility] . I did call [facility] and the pharmacist said they can deliver the medicine today

## 2022-07-26 NOTE — Telephone Encounter (Signed)
Patient last seen 09/06/2019. He is aware that an appt is needed prior to calling in pred as requested for wheezing x3w. Offered appt and he stated 'lets just forget it'. Recommended ED or UC if sx worsen.  Routing to Dr. Sherene Sires as an [place] Childes.

## 2022-07-26 NOTE — Telephone Encounter (Signed)
I spoke to the pt and he states he has been having some shortness of breath . He is on Oxygen 2.5 liters his machine cuts off if you set it higher .  When he walks in his apartment he has to sit and rest .  Pt states Dr Loura Back office called him to set up an apt but pt did not make the apt . He is asking for a Rx for Prednisone  he states we was suppose to refer him to a new pulmonologist

## 2022-07-26 NOTE — Telephone Encounter (Signed)
Caller name: WANDA CELLUCCI  On DPR?: Yes  Call back number: [phone] (mobile)  Provider they see: Sheliah Hatch, MD  Reason for call: Pt called to get a update about getting a new pulmonologist. Pt is current seeing Dr.Wart. pt also wants to know if Dr.Tabori would re-prescribe prednisone 10 mg.  Patient pharmacy is  [facility] - [place], [place] - [address]

## 2022-08-15 DIAGNOSIS — Z79891 Long term (current) use of opiate analgesic: Secondary | ICD-10-CM | POA: Diagnosis not present

## 2022-08-15 DIAGNOSIS — G894 Chronic pain syndrome: Secondary | ICD-10-CM | POA: Diagnosis not present

## 2022-09-12 DIAGNOSIS — Z79891 Long term (current) use of opiate analgesic: Secondary | ICD-10-CM | POA: Diagnosis not present

## 2022-09-12 DIAGNOSIS — G894 Chronic pain syndrome: Secondary | ICD-10-CM | POA: Diagnosis not present

## 2022-09-12 DIAGNOSIS — M545 Low back pain, unspecified: Secondary | ICD-10-CM | POA: Diagnosis not present

## 2022-09-12 DIAGNOSIS — M5136 Other intervertebral disc degeneration, lumbar region: Secondary | ICD-10-CM | POA: Diagnosis not present

## 2022-09-12 DIAGNOSIS — M5432 Sciatica, left side: Secondary | ICD-10-CM | POA: Diagnosis not present

## 2022-09-12 DIAGNOSIS — M79605 Pain in left leg: Secondary | ICD-10-CM | POA: Diagnosis not present

## 2022-09-12 DIAGNOSIS — G890 Central pain syndrome: Secondary | ICD-10-CM | POA: Diagnosis not present

## 2022-09-12 DIAGNOSIS — M79604 Pain in right leg: Secondary | ICD-10-CM | POA: Diagnosis not present

## 2022-09-12 DIAGNOSIS — M5431 Sciatica, right side: Secondary | ICD-10-CM | POA: Diagnosis not present

## 2022-09-20 ENCOUNTER — Other Ambulatory Visit: Payer: Self-pay | Admitting: Family Medicine

## 2022-09-24 ENCOUNTER — Telehealth: Payer: Self-pay | Admitting: Pharmacist

## 2022-09-24 NOTE — Progress Notes (Unsigned)
Care Management & Coordination Services Pharmacy Team  Reason for Encounter: Medication coordination and delivery  Contacted patient to discuss medications and coordinate delivery from [facility] pharmacy. {US HC Outreach:28874} Cycle dispensing form sent to Leata Mouse, CPP for review.   Last adherence delivery date: 07/08/2022      Patient is due for next adherence delivery on:  10/04/22  This delivery to include: Vials  90 Days   Sertraline 25 mg 1 tablet daily Clopidogrel 75 mg 1 tablet daily  Docusate 100 mg 1 capsule daily Rosuvastatin 20 mg 1 tablet daily Buspirone 7.5mg  1 tablet twice daily     Patient declined the following medications this month:   No refill request needed.  Delivery scheduled for 10/04/22. Unable to speak with patient to confirm date.    Any concerns about your medications? {yes/no:20286}  How often do you forget or accidentally miss a dose? {Missed doses:25554}  Do you use a pillbox? {yes/no:20286}  Is patient in packaging No  If yes  What is the date on your next pill pack?  Any concerns or issues with your packaging?   Recent blood pressure readings are as follows:***  Recent blood glucose readings are as follows: N/A    Medications: Outpatient Encounter Medications as of 09/24/2022  Medication Sig   acetaminophen (TYLENOL) 500 MG tablet Take 500 mg by mouth every 6 (six) hours as needed for headache.   albuterol (VENTOLIN HFA) 108 (90 Base) MCG/ACT inhaler INHALE TWO PUFFS BY MOUTH INTO LUNGS every SIX hours AS NEEDED FOR WHEEZING FOR SHORTNESS OF BREATH   aspirin EC 81 MG EC tablet Take 1 tablet (81 mg total) by mouth daily at 6 (six) AM. Swallow whole.   busPIRone (BUSPAR) 7.5 MG tablet Take 1 tablet (7.5 mg total) by mouth 2 (two) times daily.   clopidogrel (PLAVIX) 75 MG tablet TAKE ONE TABLET BY MOUTH ONCE DAILY   ipratropium-albuterol (DUONEB) 0.5-2.5 (3) MG/3ML SOLN TAKE THREE MLS VIA NEBULIZER EVERY 6 HOURS AS NEEDED (Patient  taking differently: Inhale 3 mLs into the lungs every 6 (six) hours as needed (shortness of breath).)   naloxone (NARCAN) nasal spray 4 mg/0.1 mL SMARTSIG:In Nostril   oxyCODONE (ROXICODONE) 15 MG immediate release tablet Take 1 tablet (15 mg total) by mouth every 6 (six) hours as needed for pain.   predniSONE (DELTASONE) 10 MG tablet 3 tabs x3 days and then 2 tabs x3 days and then 1 tab x3 days.  Take w/ food.   rosuvastatin (CRESTOR) 20 MG tablet TAKE ONE TABLET BY MOUTH ONCE DAILY   sertraline (ZOLOFT) 25 MG tablet TAKE ONE TABLET BY MOUTH ONCE DAILY   No facility-administered encounter medications on file as of 09/24/2022.   BP Readings from Last 3 Encounters:  07/03/22 116/60  01/02/22 116/80  09/17/21 (!) 144/75    Pulse Readings from Last 3 Encounters:  07/03/22 (!) 116  01/02/22 (!) 125  09/17/21 69    Lab Results  Component Value Date/Time   HGBA1C 6.1 07/04/2022 10:55 AM   HGBA1C 5.8 (H) 07/06/2021 11:04 AM   Lab Results  Component Value Date   CREATININE 0.98 07/03/2022   BUN 16 07/03/2022   GFR 75.15 07/03/2022   GFRNONAA >60 07/11/2021   GFRAA >60 12/24/2018   NA 138 07/03/2022   K 4.3 07/03/2022   CALCIUM 9.0 07/03/2022   CO2 33 (H) 07/03/2022    Future Appointments  Date Time Provider [place]  11/20/2022  1:00 PM Edythe Clarity, [facility]  [place], [facility]

## 2022-10-10 DIAGNOSIS — M5431 Sciatica, right side: Secondary | ICD-10-CM | POA: Diagnosis not present

## 2022-10-10 DIAGNOSIS — M5136 Other intervertebral disc degeneration, lumbar region: Secondary | ICD-10-CM | POA: Diagnosis not present

## 2022-10-10 DIAGNOSIS — M79604 Pain in right leg: Secondary | ICD-10-CM | POA: Diagnosis not present

## 2022-10-10 DIAGNOSIS — G894 Chronic pain syndrome: Secondary | ICD-10-CM | POA: Diagnosis not present

## 2022-10-10 DIAGNOSIS — G890 Central pain syndrome: Secondary | ICD-10-CM | POA: Diagnosis not present

## 2022-10-10 DIAGNOSIS — Z79891 Long term (current) use of opiate analgesic: Secondary | ICD-10-CM | POA: Diagnosis not present

## 2022-10-10 DIAGNOSIS — M5432 Sciatica, left side: Secondary | ICD-10-CM | POA: Diagnosis not present

## 2022-10-10 DIAGNOSIS — M545 Low back pain, unspecified: Secondary | ICD-10-CM | POA: Diagnosis not present

## 2022-10-10 DIAGNOSIS — M79605 Pain in left leg: Secondary | ICD-10-CM | POA: Diagnosis not present

## 2022-10-13 ENCOUNTER — Other Ambulatory Visit: Payer: Self-pay | Admitting: Family Medicine

## 2022-10-13 DIAGNOSIS — J9611 Chronic respiratory failure with hypoxia: Secondary | ICD-10-CM

## 2022-10-24 ENCOUNTER — Telehealth: Payer: Self-pay | Admitting: Pharmacist

## 2022-10-24 NOTE — Progress Notes (Signed)
Care Management & Coordination Services Pharmacy Team  Reason for Encounter: Medication coordination and delivery  Contacted patient to discuss medications and coordinate delivery from [facility] pharmacy.  Cycle dispensing form sent to Leata Mouse, CPP for review.   Last adherence delivery date:  07/08/22  Patient is due for next adherence delivery on: 11/05/22  This delivery to include: Vials  30 Days   Sertraline 25 mg 1 tablet daily Clopidogrel 75 mg 1 tablet daily  Docusate 100 mg 1 capsule daily Rosuvastatin 20 mg 1 tablet daily Buspirone 7.5mg  1 tablet twice daily    Patient declined the following medications this month: None  No refill request needed.  Delivery scheduled for 11/05/22. Unable to speak with patient to confirm date.    Any concerns about your medications? No  How often do you forget or accidentally miss a dose? Rarely  Do you use a pillbox? No  Is patient in packaging No  If yes  What is the date on your next pill pack?  Any concerns or issues with your packaging?   Recent blood pressure readings are as follows: N/A  Recent blood glucose readings are as follows: N/A    Medications: Outpatient Encounter Medications as of 10/24/2022  Medication Sig   acetaminophen (TYLENOL) 500 MG tablet Take 500 mg by mouth every 6 (six) hours as needed for headache.   albuterol (VENTOLIN HFA) 108 (90 Base) MCG/ACT inhaler INHALE TWO PUFFS BY MOUTH INTO LUNGS every SIX hours AS NEEDED FOR WHEEZING AND/OR SHORTNESS OF BREATH   aspirin EC 81 MG EC tablet Take 1 tablet (81 mg total) by mouth daily at 6 (six) AM. Swallow whole.   busPIRone (BUSPAR) 7.5 MG tablet Take 1 tablet (7.5 mg total) by mouth 2 (two) times daily.   clopidogrel (PLAVIX) 75 MG tablet TAKE ONE TABLET BY MOUTH ONCE DAILY   ipratropium-albuterol (DUONEB) 0.5-2.5 (3) MG/3ML SOLN TAKE THREE MLS VIA NEBULIZER EVERY 6 HOURS AS NEEDED (Patient taking differently: Inhale 3 mLs into the lungs every 6 (six)  hours as needed (shortness of breath).)   naloxone (NARCAN) nasal spray 4 mg/0.1 mL SMARTSIG:In Nostril   oxyCODONE (ROXICODONE) 15 MG immediate release tablet Take 1 tablet (15 mg total) by mouth every 6 (six) hours as needed for pain.   predniSONE (DELTASONE) 10 MG tablet 3 tabs x3 days and then 2 tabs x3 days and then 1 tab x3 days.  Take w/ food.   rosuvastatin (CRESTOR) 20 MG tablet TAKE ONE TABLET BY MOUTH ONCE DAILY   sertraline (ZOLOFT) 25 MG tablet TAKE ONE TABLET BY MOUTH ONCE DAILY   No facility-administered encounter medications on file as of 10/24/2022.   BP Readings from Last 3 Encounters:  07/03/22 116/60  01/02/22 116/80  09/17/21 (!) 144/75    Pulse Readings from Last 3 Encounters:  07/03/22 (!) 116  01/02/22 (!) 125  09/17/21 69    Lab Results  Component Value Date/Time   HGBA1C 6.1 07/04/2022 10:55 AM   HGBA1C 5.8 (H) 07/06/2021 11:04 AM   Lab Results  Component Value Date   CREATININE 0.98 07/03/2022   BUN 16 07/03/2022   GFR 75.15 07/03/2022   GFRNONAA >60 07/11/2021   GFRAA >60 12/24/2018   NA 138 07/03/2022   K 4.3 07/03/2022   CALCIUM 9.0 07/03/2022   CO2 33 (H) 07/03/2022     Future Appointments  Date Time Provider [place]  11/20/2022  1:00 PM Edythe Clarity, [place] None   Patient states  he has not opened his delivery from last month from going from 90 days to 30 days and he said he would not need a delivery this month and can wait until next month.   [facility], [facility]

## 2022-10-29 ENCOUNTER — Other Ambulatory Visit: Payer: Self-pay

## 2022-10-29 NOTE — Telephone Encounter (Signed)
Patient is requesting a refill of the following medications: Requested Prescriptions   Pending Prescriptions Disp Refills   predniSONE (DELTASONE) 10 MG tablet 18 tablet 0    Sig: 3 tabs x3 days and then 2 tabs x3 days and then 1 tab x3 days.  Take w/ food.    Date of patient request: 10/29/22 Last office visit: 07/03/22 Date of last refill: 07/26/22 Last refill amount: 30

## 2022-11-07 DIAGNOSIS — M79604 Pain in right leg: Secondary | ICD-10-CM | POA: Diagnosis not present

## 2022-11-07 DIAGNOSIS — M5431 Sciatica, right side: Secondary | ICD-10-CM | POA: Diagnosis not present

## 2022-11-07 DIAGNOSIS — M79605 Pain in left leg: Secondary | ICD-10-CM | POA: Diagnosis not present

## 2022-11-07 DIAGNOSIS — G890 Central pain syndrome: Secondary | ICD-10-CM | POA: Diagnosis not present

## 2022-11-07 DIAGNOSIS — Z79891 Long term (current) use of opiate analgesic: Secondary | ICD-10-CM | POA: Diagnosis not present

## 2022-11-07 DIAGNOSIS — M5432 Sciatica, left side: Secondary | ICD-10-CM | POA: Diagnosis not present

## 2022-11-07 DIAGNOSIS — M5136 Other intervertebral disc degeneration, lumbar region: Secondary | ICD-10-CM | POA: Diagnosis not present

## 2022-11-07 DIAGNOSIS — G894 Chronic pain syndrome: Secondary | ICD-10-CM | POA: Diagnosis not present

## 2022-11-07 DIAGNOSIS — M545 Low back pain, unspecified: Secondary | ICD-10-CM | POA: Diagnosis not present

## 2022-11-11 ENCOUNTER — Inpatient Hospital Stay (HOSPITAL_COMMUNITY)
Admission: EM | Admit: 2022-11-11 | Discharge: 2022-11-13 | DRG: 189 | Disposition: A | Discharge status: Home Health | Payer: Medicare Other | Attending: Internal Medicine | Admitting: Internal Medicine

## 2022-11-11 ENCOUNTER — Emergency Department (HOSPITAL_COMMUNITY): Payer: Medicare Other

## 2022-11-11 ENCOUNTER — Other Ambulatory Visit: Payer: Self-pay

## 2022-11-11 ENCOUNTER — Telehealth: Payer: Self-pay | Admitting: Family Medicine

## 2022-11-11 DIAGNOSIS — Z8249 Family history of ischemic heart disease and other diseases of the circulatory system: Secondary | ICD-10-CM

## 2022-11-11 DIAGNOSIS — I7143 Infrarenal abdominal aortic aneurysm, without rupture: Secondary | ICD-10-CM | POA: Diagnosis not present

## 2022-11-11 DIAGNOSIS — F32A Depression, unspecified: Secondary | ICD-10-CM | POA: Diagnosis present

## 2022-11-11 DIAGNOSIS — R0602 Shortness of breath: Secondary | ICD-10-CM | POA: Diagnosis not present

## 2022-11-11 DIAGNOSIS — J439 Emphysema, unspecified: Secondary | ICD-10-CM | POA: Diagnosis not present

## 2022-11-11 DIAGNOSIS — Z9981 Dependence on supplemental oxygen: Secondary | ICD-10-CM | POA: Diagnosis not present

## 2022-11-11 DIAGNOSIS — R739 Hyperglycemia, unspecified: Secondary | ICD-10-CM | POA: Diagnosis not present

## 2022-11-11 DIAGNOSIS — G894 Chronic pain syndrome: Secondary | ICD-10-CM | POA: Diagnosis not present

## 2022-11-11 DIAGNOSIS — I10 Essential (primary) hypertension: Secondary | ICD-10-CM | POA: Diagnosis present

## 2022-11-11 DIAGNOSIS — R6889 Other general symptoms and signs: Secondary | ICD-10-CM | POA: Diagnosis not present

## 2022-11-11 DIAGNOSIS — Z833 Family history of diabetes mellitus: Secondary | ICD-10-CM

## 2022-11-11 DIAGNOSIS — T380X5A Adverse effect of glucocorticoids and synthetic analogues, initial encounter: Secondary | ICD-10-CM | POA: Diagnosis not present

## 2022-11-11 DIAGNOSIS — Z1152 Encounter for screening for COVID-19: Secondary | ICD-10-CM | POA: Diagnosis not present

## 2022-11-11 DIAGNOSIS — Z83438 Family history of other disorder of lipoprotein metabolism and other lipidemia: Secondary | ICD-10-CM | POA: Diagnosis not present

## 2022-11-11 DIAGNOSIS — E871 Hypo-osmolality and hyponatremia: Secondary | ICD-10-CM | POA: Diagnosis not present

## 2022-11-11 DIAGNOSIS — R911 Solitary pulmonary nodule: Secondary | ICD-10-CM | POA: Diagnosis not present

## 2022-11-11 DIAGNOSIS — J9621 Acute and chronic respiratory failure with hypoxia: Principal | ICD-10-CM | POA: Diagnosis present

## 2022-11-11 DIAGNOSIS — R7989 Other specified abnormal findings of blood chemistry: Secondary | ICD-10-CM | POA: Diagnosis not present

## 2022-11-11 DIAGNOSIS — Z881 Allergy status to other antibiotic agents status: Secondary | ICD-10-CM

## 2022-11-11 DIAGNOSIS — Z6822 Body mass index (BMI) 22.0-22.9, adult: Secondary | ICD-10-CM

## 2022-11-11 DIAGNOSIS — Z7982 Long term (current) use of aspirin: Secondary | ICD-10-CM

## 2022-11-11 DIAGNOSIS — Z87891 Personal history of nicotine dependence: Secondary | ICD-10-CM | POA: Diagnosis not present

## 2022-11-11 DIAGNOSIS — J441 Chronic obstructive pulmonary disease with (acute) exacerbation: Secondary | ICD-10-CM | POA: Diagnosis present

## 2022-11-11 DIAGNOSIS — Z79899 Other long term (current) drug therapy: Secondary | ICD-10-CM

## 2022-11-11 DIAGNOSIS — Z7902 Long term (current) use of antithrombotics/antiplatelets: Secondary | ICD-10-CM

## 2022-11-11 DIAGNOSIS — Z95828 Presence of other vascular implants and grafts: Secondary | ICD-10-CM | POA: Diagnosis not present

## 2022-11-11 DIAGNOSIS — E785 Hyperlipidemia, unspecified: Secondary | ICD-10-CM | POA: Diagnosis not present

## 2022-11-11 DIAGNOSIS — Z800 Family history of malignant neoplasm of digestive organs: Secondary | ICD-10-CM

## 2022-11-11 DIAGNOSIS — F419 Anxiety disorder, unspecified: Secondary | ICD-10-CM | POA: Diagnosis present

## 2022-11-11 DIAGNOSIS — J449 Chronic obstructive pulmonary disease, unspecified: Secondary | ICD-10-CM | POA: Diagnosis present

## 2022-11-11 DIAGNOSIS — I739 Peripheral vascular disease, unspecified: Secondary | ICD-10-CM | POA: Diagnosis not present

## 2022-11-11 DIAGNOSIS — E440 Moderate protein-calorie malnutrition: Secondary | ICD-10-CM | POA: Diagnosis present

## 2022-11-11 DIAGNOSIS — Z743 Need for continuous supervision: Secondary | ICD-10-CM | POA: Diagnosis not present

## 2022-11-11 DIAGNOSIS — R059 Cough, unspecified: Secondary | ICD-10-CM | POA: Diagnosis not present

## 2022-11-11 DIAGNOSIS — J9611 Chronic respiratory failure with hypoxia: Secondary | ICD-10-CM

## 2022-11-11 DIAGNOSIS — R062 Wheezing: Secondary | ICD-10-CM | POA: Diagnosis not present

## 2022-11-11 LAB — CBC WITH DIFFERENTIAL/PLATELET
Abs Immature Granulocytes: 0.05 10*3/uL (ref 0.00–0.07)
Basophils Absolute: 0.1 10*3/uL (ref 0.0–0.1)
Basophils Relative: 1 %
Eosinophils Absolute: 0.1 10*3/uL (ref 0.0–0.5)
Eosinophils Relative: 1 %
HCT: 39.2 % (ref 39.0–52.0)
Hemoglobin: 12.2 g/dL — ABNORMAL LOW (ref 13.0–17.0)
Immature Granulocytes: 1 %
Lymphocytes Relative: 5 %
Lymphs Abs: 0.5 10*3/uL — ABNORMAL LOW (ref 0.7–4.0)
MCH: 28.8 pg (ref 26.0–34.0)
MCHC: 31.1 g/dL (ref 30.0–36.0)
MCV: 92.7 fL (ref 80.0–100.0)
Monocytes Absolute: 0.3 10*3/uL (ref 0.1–1.0)
Monocytes Relative: 3 %
Neutro Abs: 9.7 10*3/uL — ABNORMAL HIGH (ref 1.7–7.7)
Neutrophils Relative %: 89 %
Platelets: 273 10*3/uL (ref 150–400)
RBC: 4.23 MIL/uL (ref 4.22–5.81)
RDW: 13.8 % (ref 11.5–15.5)
WBC: 10.8 10*3/uL — ABNORMAL HIGH (ref 4.0–10.5)
nRBC: 0 % (ref 0.0–0.2)

## 2022-11-11 LAB — BASIC METABOLIC PANEL
Anion gap: 13 (ref 5–15)
BUN: 8 mg/dL (ref 8–23)
CO2: 26 mmol/L (ref 22–32)
Calcium: 8.8 mg/dL — ABNORMAL LOW (ref 8.9–10.3)
Chloride: 93 mmol/L — ABNORMAL LOW (ref 98–111)
Creatinine, Ser: 0.82 mg/dL (ref 0.61–1.24)
GFR, Estimated: >60 mL/min (ref >60)
Glucose, Bld: 151 mg/dL — ABNORMAL HIGH (ref 70–99)
Potassium: 3.7 mmol/L (ref 3.5–5.1)
Sodium: 132 mmol/L — ABNORMAL LOW (ref 135–145)

## 2022-11-11 LAB — TROPONIN I (HIGH SENSITIVITY): Troponin I (High Sensitivity): 42 ng/L — ABNORMAL HIGH (ref <18)

## 2022-11-11 NOTE — ED Provider Notes (Signed)
I provided a substantive portion of the care of this patient.  I personally made/approved the management plan for this patient and take responsibility for the patient management.  EKG Interpretation  Date/Time:  Monday November 11 2022 22:00:31 EDT Ventricular Rate:  84 PR Interval:  180 QRS Duration: 84 QT Interval:  382 QTC Calculation: 452 R Axis:   49 Text Interpretation: Sinus rhythm Consider left atrial enlargement Confirmed by Lacretia Leigh ([id number]) on 11/11/2022 11:35:83 PM   77 year old male who presents with acute onset shortness of breath.  History of COPD.  Patient states that his nebulizer did not help.  Concern for possible PE.  Workup is pending at this time   Lacretia Leigh, MD 11/11/22 2327

## 2022-11-11 NOTE — ED Provider Notes (Signed)
I received this patient handoff from previous PA.  Please see his note for original history and workup thus far.  In short patient is a 77 year old male with a past medical COPD presenting today with shortness of breath.  He usually is on 2.5 L of oxygen however for comfort he has been requiring 5 L.  He was given Solu-Medrol and DuoNeb with EMS and reported feeling somewhat better and was no longer wheezing on physical exam.  Still requiring 4 L to maintain oxygen saturations in the upper 80s/low 90s.  There was some concern for PE and current CT PE scan is ordered at this time.   BP (!) 150/76   Pulse 83   Temp 98 F (36.7 C) (Oral)   Resp (!) 27   Ht 5\' 7"  (1.702 m)   Wt 65.8 kg   SpO2 100%   BMI 22.71 kg/m   Physical Exam Vitals and nursing note reviewed.  Constitutional:      Appearance: Normal appearance.  HENT:     Head: Normocephalic and atraumatic.  Eyes:     General: No scleral icterus.    Conjunctiva/sclera: Conjunctivae normal.  Pulmonary:     Effort: Pulmonary effort is normal. No respiratory distress.  Skin:    Findings: No rash.  Neurological:     Mental Status: He is alert.  Psychiatric:        Mood and Affect: Mood normal.     Procedures  Procedures  ED Course / MDM   Clinical Course as of 11/12/22 0100  Mon Nov 11, 2022  2330 On exam, lungs sounded clear with good air entry.  Patient is still on 4 L of oxygen and stating that he is short of breath.  Given the acute nature of his respiratory distress this morning.  Concern for PE.  CT ordered.  Signed out patient to [facility] who will continue to manage and follow-up on CT results.  [JR]  Tue Nov 12, 2022  [id number] Patient reevaluated at bedside.  He is resting comfortably on 5 L of oxygen.  When I bumped his oxygen down to 2.5 he becomes tachypneic however not hypoxic.  I hoped to potentially be able to discharge this patient home however he tells me his at home oxygen machine turns off anytime he  tries to bump it higher than 2.5.  He says that he has had this machine for 3 years and nobody has come to check on it [MR]    Clinical Course User Index [JR] Harriet Pho, PA-C [MR] Tonianne Fine, Cecilio Asper, PA-C   Medical Decision Making Amount and/or Complexity of Data Reviewed Labs: ordered. Radiology: ordered.  Risk Prescription drug management. Decision regarding hospitalization.   No PE on CT scan.  Patient reevaluated and resting comfortably on 5 L of oxygen.  When oxygen is turned back down to his 2.5 he becomes tachypneic.  At that time his oxygen saturation goes to around 90%.  He tells me that at home a time that he walked his oxygen saturation would go to the low 80s so he had to turn it up to 5 L.  Patient placed back on 5 L of oxygen.  BNP is 271.5.  He does not appear fluid overloaded and no pulmonary edema on chest x-ray.  Will not give diuretic at this time.  Original troponin elevated to 60 and that is now 42.  Question some demand.  Previous team did not believe cardiology needed to be consulted.  EKG nonischemic.  At this time patient will be admitted to the hospital for COPD exacerbation and hypoxia.  He may need social work consult to help with outpatient oxygen machine  Admitted to Dr. Clint Lipps, [place] A, PA-C 11/12/22 0125    Lacretia Leigh, MD 11/12/22 1749

## 2022-11-11 NOTE — Telephone Encounter (Signed)
Encourage patient to contact the pharmacy for refills or they can request refills through [facility]:  [place], [place] - [address]   MEDICATION NAME & DOSE: predniSONE (DELTASONE) 10 MG tablet   NOTES/COMMENTS FROM PATIENT:Pt opened his box of medication and the Prednisone wasn't in there. He would like it filled and delivered.   [facility] office please notify patient: It takes 48-72 hours to process rx refill requests Ask patient to call pharmacy to ensure rx is ready before heading there.

## 2022-11-11 NOTE — ED Provider Notes (Signed)
[facility] Provider Note   CSN: [id number] Arrival date & time: 11/11/22  2152     History {Add pertinent medical, surgical, social history, OB history to HPI:1} Chief Complaint  Patient presents with   Shortness of Breath   HPI Christopher Pena is a 77 y.o. male with COPD, PAD, and hypertension presenting for shortness of breath.  Symptoms started this afternoon.  Shortness of breath is worse with exertion.  Also endorses associated productive cough with clear sputum.  Denies fever.  States normal oxygen requirement for COPD is around 2.5 L.  Today he had increased to 5 L because he was having trouble breathing.  States that O2 sats during that time however between 88%-96%.  Denies chest pain.  Patient brought in by EMS and received DuoNeb and Solu-Medrol en route.  EMS noted tachypnea and increased work of breathing.    Shortness of Breath      Home Medications Prior to Admission medications   Medication Sig Start Date End Date Taking? Authorizing Provider  acetaminophen (TYLENOL) 500 MG tablet Take 500 mg by mouth every 6 (six) hours as needed for headache.    [provider]  albuterol (VENTOLIN HFA) 108 (90 Base) MCG/ACT inhaler INHALE TWO PUFFS BY MOUTH INTO LUNGS every SIX hours AS NEEDED FOR WHEEZING AND/OR SHORTNESS OF BREATH 10/14/22   [name], MD  aspirin EC 81 MG EC tablet Take 1 tablet (81 mg total) by mouth daily at 6 (six) AM. Swallow whole. 12/11/20   Rhyne, Hulen Shouts, PA-C  busPIRone (BUSPAR) 7.5 MG tablet Take 1 tablet (7.5 mg total) by mouth 2 (two) times daily. 04/04/22   [name], MD  clopidogrel (PLAVIX) 75 MG tablet TAKE ONE TABLET BY MOUTH ONCE DAILY 07/01/22   [name], MD  FLUAD QUADRIVALENT 0.5 ML injection  06/13/22   [provider]  ipratropium-albuterol (DUONEB) 0.5-2.5 (3) MG/3ML SOLN TAKE THREE MLS VIA NEBULIZER EVERY 6 HOURS AS NEEDED Patient taking  differently: Inhale 3 mLs into the lungs every 6 (six) hours as needed (shortness of breath). 02/11/20   Tanda Rockers, MD  naloxone [facility]) nasal spray 4 mg/0.1 mL SMARTSIG:In Nostril 06/12/21   [provider]  oxyCODONE (ROXICODONE) 15 MG immediate release tablet Take 1 tablet (15 mg total) by mouth every 6 (six) hours as needed for pain. 07/12/21   Dagoberto Ligas, PA-C  predniSONE (DELTASONE) 10 MG tablet 3 tabs x3 days and then 2 tabs x3 days and then 1 tab x3 days.  Take w/ food. 07/26/22   [name], MD  rosuvastatin (CRESTOR) 20 MG tablet TAKE ONE TABLET BY MOUTH ONCE DAILY 09/20/22   [name], MD  sertraline (ZOLOFT) 25 MG tablet TAKE ONE TABLET BY MOUTH ONCE DAILY 04/04/22   [name], MD  [facility] syringe  06/13/22   [provider]      Allergies    Clindamycin/lincomycin    Review of Systems   Review of Systems  Respiratory:  Positive for shortness of breath.     Physical Exam   Vitals:   11/11/22 2215 11/11/22 2246  BP: (!) 150/76   Pulse: 83   Resp: (!) 27   Temp:  98 F (36.7 C)  SpO2: 100%     CONSTITUTIONAL:  well-appearing, NAD NEURO:  Alert and oriented x 3, CN 3-12 grossly intact EYES:  eyes equal and reactive ENT/NECK:  Supple, no stridor  CARDIO:  regular rate and rhythm, appears well-perfused  PULM:  No respiratory distress, CTAB GI/GU:  non-distended MSK/SPINE:  No gross deformities, no edema, moves all extremities  SKIN:  no rash, atraumatic  *Additional and/or pertinent findings included in MDM below  ED Results / Procedures / Treatments   Labs (all labs ordered are listed, but only abnormal results are displayed) Labs Reviewed  CBC WITH DIFFERENTIAL/PLATELET - Abnormal; Notable for the following components:      Result Value   WBC 10.8 (*)    Hemoglobin 12.2 (*)    Neutro Abs 9.7 (*)    Lymphs Abs 0.5 (*)    All other components within normal limits  BASIC METABOLIC PANEL - Abnormal;  Notable for the following components:   Sodium 132 (*)    Chloride 93 (*)    Glucose, Bld 151 (*)    Calcium 8.8 (*)    All other components within normal limits  BRAIN NATRIURETIC PEPTIDE - Abnormal; Notable for the following components:   B Natriuretic Peptide 271.5 (*)    All other components within normal limits  TROPONIN I (HIGH SENSITIVITY) - Abnormal; Notable for the following components:   Troponin I (High Sensitivity) 42 (*)    All other components within normal limits  RESP PANEL BY RT-PCR (RSV, FLU A&B, COVID)  [id number]    EKG EKG Interpretation  Date/Time:  Monday November 11 2022 22:00:31 EDT Ventricular Rate:  84 PR Interval:  180 QRS Duration: 84 QT Interval:  382 QTC Calculation: 452 R Axis:   49 Text Interpretation: Sinus rhythm Consider left atrial enlargement Confirmed by Lacretia Leigh ([id number]) on 11/11/2022 11:23:18 PM  Radiology DG Chest Port 1 View  Result Date: 11/11/2022 CLINICAL DATA:  Shortness of breath since this afternoon. EXAM: PORTABLE CHEST 1 VIEW COMPARISON:  08/08/2020 FINDINGS: Heart size and pulmonary vascularity are normal. Emphysematous changes in the lungs. Diffuse interstitial fibrosis and peribronchial thickening suggesting chronic bronchitis. Bilateral apical pleural thickening, scarring, and calcification, likely postinflammatory. No developing airspace disease or consolidation. Nodular opacity in the right lower lung likely represents a prominent nipple shadow. No pleural effusions. No pneumothorax. Calcification of the aorta. IMPRESSION: Emphysematous changes, fibrosis, and chronic bronchitic changes in the lungs. Bilateral apical pleural thickening and calcification is unchanged and likely postinflammatory. No developing consolidation or edema. Electronically Signed   By: Lucienne Capers M.D.   On: 11/11/2022 22:58    Procedures Procedures  {Document cardiac monitor, telemetry assessment procedure when appropriate:1}  Medications Ordered in  ED Medications - No data to display  ED Course/ Medical Decision Making/ A&P Clinical Course as of 11/12/22 0003  Mon Nov 11, 2022  2330 On exam, lungs sounded clear with good air entry.  Patient is still on 4 L of oxygen and stating that he is short of breath.  Given the acute nature of his respiratory distress this morning.  Concern for PE.  CT ordered.  Signed out patient to [facility] who will continue to manage and follow-up on CT results.  [JR]    Clinical Course User Index [JR] Harriet Pho, PA-C   {   Click here for ABCD2, HEART and other calculatorsREFRESH Note before signing :1}                          Medical Decision Making  Initial Impression and Ddx *** Patient PMH that increases complexity of ED encounter:  ***  Interpretation of Diagnostics  I independent reviewed and interpreted the labs as followed: ***  - I independently visualized the following imaging with scope of interpretation limited to determining acute life threatening conditions related to emergency care: ***, which revealed ***  Patient Reassessment and Ultimate Disposition/Management ***  Patient management required discussion with the following services or consulting groups:  {BEROCONSULT:26841}  Complexity of Problems Addressed {BEROCOPA:26833}  Additional Data Reviewed and Analyzed Further history obtained from: {BERODATA:26834}  Patient Encounter Risk Assessment {BERORISK:26838}   {Document critical care time when appropriate:1} {Document review of labs and clinical decision tools ie heart score, Chads2Vasc2 etc:1}  {Document your independent review of radiology images, and any outside records:1} {Document your discussion with family members, caretakers, and with consultants:1} {Document social determinants of health affecting pt's care:1} {Document your decision making why or why not admission, treatments were needed:1} Final Clinical Impression(s) / ED Diagnoses Final  diagnoses:  None    Rx / DC Orders ED Discharge Orders     None

## 2022-11-11 NOTE — ED Triage Notes (Addendum)
Pt arrives with [facility] since this afternoon. Pt reports feeling like he needed to increase his oxygen today due to being short of breath on exertion. Pt received 1 duo neb and solumedrol with EMS. EMS reports tachypnea and increased work of breathing. Pt wears 3-4L oxygen at baseline

## 2022-11-11 NOTE — Telephone Encounter (Signed)
Pt was given a short Rx prednisone in December and is now asking for it back seems he is under the impression this is a regular med, by everything I can tell he is not to continue this medication regularly correct?

## 2022-11-12 ENCOUNTER — Emergency Department (HOSPITAL_COMMUNITY): Payer: Medicare Other

## 2022-11-12 DIAGNOSIS — T380X5A Adverse effect of glucocorticoids and synthetic analogues, initial encounter: Secondary | ICD-10-CM | POA: Diagnosis present

## 2022-11-12 DIAGNOSIS — Z8249 Family history of ischemic heart disease and other diseases of the circulatory system: Secondary | ICD-10-CM | POA: Diagnosis not present

## 2022-11-12 DIAGNOSIS — E871 Hypo-osmolality and hyponatremia: Secondary | ICD-10-CM | POA: Diagnosis present

## 2022-11-12 DIAGNOSIS — I10 Essential (primary) hypertension: Secondary | ICD-10-CM | POA: Diagnosis present

## 2022-11-12 DIAGNOSIS — J9621 Acute and chronic respiratory failure with hypoxia: Secondary | ICD-10-CM | POA: Diagnosis present

## 2022-11-12 DIAGNOSIS — R739 Hyperglycemia, unspecified: Secondary | ICD-10-CM | POA: Diagnosis present

## 2022-11-12 DIAGNOSIS — J441 Chronic obstructive pulmonary disease with (acute) exacerbation: Secondary | ICD-10-CM

## 2022-11-12 DIAGNOSIS — Z95828 Presence of other vascular implants and grafts: Secondary | ICD-10-CM | POA: Diagnosis not present

## 2022-11-12 DIAGNOSIS — G894 Chronic pain syndrome: Secondary | ICD-10-CM | POA: Diagnosis present

## 2022-11-12 DIAGNOSIS — E785 Hyperlipidemia, unspecified: Secondary | ICD-10-CM | POA: Diagnosis present

## 2022-11-12 DIAGNOSIS — J449 Chronic obstructive pulmonary disease, unspecified: Secondary | ICD-10-CM | POA: Diagnosis present

## 2022-11-12 DIAGNOSIS — F32A Depression, unspecified: Secondary | ICD-10-CM | POA: Diagnosis present

## 2022-11-12 DIAGNOSIS — F419 Anxiety disorder, unspecified: Secondary | ICD-10-CM | POA: Diagnosis present

## 2022-11-12 DIAGNOSIS — Z833 Family history of diabetes mellitus: Secondary | ICD-10-CM | POA: Diagnosis not present

## 2022-11-12 DIAGNOSIS — R911 Solitary pulmonary nodule: Secondary | ICD-10-CM | POA: Diagnosis present

## 2022-11-12 DIAGNOSIS — I7143 Infrarenal abdominal aortic aneurysm, without rupture: Secondary | ICD-10-CM | POA: Diagnosis present

## 2022-11-12 DIAGNOSIS — Z1152 Encounter for screening for COVID-19: Secondary | ICD-10-CM | POA: Diagnosis not present

## 2022-11-12 DIAGNOSIS — I739 Peripheral vascular disease, unspecified: Secondary | ICD-10-CM | POA: Diagnosis present

## 2022-11-12 DIAGNOSIS — E440 Moderate protein-calorie malnutrition: Secondary | ICD-10-CM | POA: Diagnosis present

## 2022-11-12 DIAGNOSIS — Z881 Allergy status to other antibiotic agents status: Secondary | ICD-10-CM | POA: Diagnosis not present

## 2022-11-12 DIAGNOSIS — Z83438 Family history of other disorder of lipoprotein metabolism and other lipidemia: Secondary | ICD-10-CM | POA: Diagnosis not present

## 2022-11-12 DIAGNOSIS — R7989 Other specified abnormal findings of blood chemistry: Secondary | ICD-10-CM | POA: Diagnosis present

## 2022-11-12 DIAGNOSIS — Z87891 Personal history of nicotine dependence: Secondary | ICD-10-CM | POA: Diagnosis not present

## 2022-11-12 DIAGNOSIS — Z9981 Dependence on supplemental oxygen: Secondary | ICD-10-CM | POA: Diagnosis not present

## 2022-11-12 LAB — RESP PANEL BY RT-PCR (RSV, FLU A&B, COVID)  RVPGX2
Influenza A by PCR: NEGATIVE
Influenza B by PCR: NEGATIVE
Resp Syncytial Virus by PCR: NEGATIVE
SARS Coronavirus 2 by RT PCR: NEGATIVE

## 2022-11-12 LAB — COMPREHENSIVE METABOLIC PANEL
ALT: 13 U/L (ref 0–44)
AST: 30 U/L (ref 15–41)
Albumin: 3.4 g/dL — ABNORMAL LOW (ref 3.5–5.0)
Alkaline Phosphatase: 66 U/L (ref 38–126)
Anion gap: 11 (ref 5–15)
BUN: 7 mg/dL — ABNORMAL LOW (ref 8–23)
CO2: 26 mmol/L (ref 22–32)
Calcium: 8.7 mg/dL — ABNORMAL LOW (ref 8.9–10.3)
Chloride: 96 mmol/L — ABNORMAL LOW (ref 98–111)
Creatinine, Ser: 0.92 mg/dL (ref 0.61–1.24)
GFR, Estimated: >60 mL/min (ref >60)
Glucose, Bld: 202 mg/dL — ABNORMAL HIGH (ref 70–99)
Potassium: 4.2 mmol/L (ref 3.5–5.1)
Sodium: 133 mmol/L — ABNORMAL LOW (ref 135–145)
Total Bilirubin: 0.7 mg/dL (ref 0.3–1.2)
Total Protein: 7.5 g/dL (ref 6.5–8.1)

## 2022-11-12 LAB — BRAIN NATRIURETIC PEPTIDE: B Natriuretic Peptide: 271.5 pg/mL — ABNORMAL HIGH (ref 0.0–100.0)

## 2022-11-12 LAB — CBC
HCT: 39.1 % (ref 39.0–52.0)
Hemoglobin: 12.5 g/dL — ABNORMAL LOW (ref 13.0–17.0)
MCH: 29.1 pg (ref 26.0–34.0)
MCHC: 32 g/dL (ref 30.0–36.0)
MCV: 91.1 fL (ref 80.0–100.0)
Platelets: 262 10*3/uL (ref 150–400)
RBC: 4.29 MIL/uL (ref 4.22–5.81)
RDW: 13.7 % (ref 11.5–15.5)
WBC: 9 10*3/uL (ref 4.0–10.5)
nRBC: 0 % (ref 0.0–0.2)

## 2022-11-12 LAB — TROPONIN I (HIGH SENSITIVITY): Troponin I (High Sensitivity): 36 ng/L — ABNORMAL HIGH (ref <18)

## 2022-11-12 LAB — PHOSPHORUS: Phosphorus: 2 mg/dL — ABNORMAL LOW (ref 2.5–4.6)

## 2022-11-12 LAB — CBG MONITORING, ED
Glucose-Capillary: 159 mg/dL — ABNORMAL HIGH (ref 70–99)
Glucose-Capillary: 133 mg/dL — ABNORMAL HIGH (ref 70–99)
Glucose-Capillary: 139 mg/dL — ABNORMAL HIGH (ref 70–99)
Glucose-Capillary: 133 mg/dL — ABNORMAL HIGH (ref 70–99)
Glucose-Capillary: 157 mg/dL — ABNORMAL HIGH (ref 70–99)

## 2022-11-12 LAB — MAGNESIUM: Magnesium: 1.9 mg/dL (ref 1.7–2.4)

## 2022-11-12 LAB — GLUCOSE, CAPILLARY: Glucose-Capillary: 132 mg/dL — ABNORMAL HIGH (ref 70–99)

## 2022-11-12 MED ORDER — SERTRALINE HCL 25 MG PO TABS
25.0000 mg | ORAL_TABLET | Freq: Every day | ORAL | Status: DC
  Administered 2022-11-12 – 2022-11-13 (×2): 25 mg via ORAL
  Filled 2022-11-12 (×2): qty 1

## 2022-11-12 MED ORDER — INSULIN ASPART 100 UNIT/ML IJ SOLN: 0.0000 [IU] | Freq: Every day | INTRAMUSCULAR | Status: DC

## 2022-11-12 MED ORDER — CLOPIDOGREL BISULFATE 75 MG PO TABS
75.0000 mg | ORAL_TABLET | Freq: Every day | ORAL | Status: DC
  Administered 2022-11-12 – 2022-11-13 (×2): 75 mg via ORAL
  Filled 2022-11-12 (×2): qty 1

## 2022-11-12 MED ORDER — GUAIFENESIN ER 600 MG PO TB12
600.0000 mg | ORAL_TABLET | Freq: Two times a day (BID) | ORAL | Status: DC
  Administered 2022-11-12 – 2022-11-13 (×4): 600 mg via ORAL
  Filled 2022-11-12 (×4): qty 1

## 2022-11-12 MED ORDER — INSULIN ASPART 100 UNIT/ML IJ SOLN
0.0000 [IU] | Freq: Three times a day (TID) | INTRAMUSCULAR | Status: DC
  Administered 2022-11-12: 1 [IU] via SUBCUTANEOUS
  Administered 2022-11-12: 2 [IU] via SUBCUTANEOUS
  Administered 2022-11-12 – 2022-11-13 (×2): 1 [IU] via SUBCUTANEOUS

## 2022-11-12 MED ORDER — IPRATROPIUM-ALBUTEROL 0.5-2.5 (3) MG/3ML IN SOLN
3.0000 mL | Freq: Three times a day (TID) | RESPIRATORY_TRACT | Status: DC
  Administered 2022-11-13: 3 mL via RESPIRATORY_TRACT
  Filled 2022-11-12: qty 3

## 2022-11-12 MED ORDER — IPRATROPIUM-ALBUTEROL 0.5-2.5 (3) MG/3ML IN SOLN
3.0000 mL | Freq: Four times a day (QID) | RESPIRATORY_TRACT | Status: DC
  Administered 2022-11-12 (×4): 3 mL via RESPIRATORY_TRACT
  Filled 2022-11-12 (×4): qty 3

## 2022-11-12 MED ORDER — METHYLPREDNISOLONE SODIUM SUCC 40 MG IJ SOLR
40.0000 mg | Freq: Every day | INTRAMUSCULAR | Status: DC
  Administered 2022-11-12 – 2022-11-13 (×2): 40 mg via INTRAVENOUS
  Filled 2022-11-12 (×2): qty 1

## 2022-11-12 MED ORDER — ASPIRIN 81 MG PO TBEC
81.0000 mg | DELAYED_RELEASE_TABLET | Freq: Every day | ORAL | Status: DC
  Administered 2022-11-12 – 2022-11-13 (×2): 81 mg via ORAL
  Filled 2022-11-12 (×2): qty 1

## 2022-11-12 MED ORDER — AZITHROMYCIN 500 MG PO TABS
250.0000 mg | ORAL_TABLET | Freq: Every day | ORAL | Status: DC
  Administered 2022-11-13: 250 mg via ORAL
  Filled 2022-11-12: qty 1

## 2022-11-12 MED ORDER — AZITHROMYCIN 250 MG PO TABS
500.0000 mg | ORAL_TABLET | Freq: Every day | ORAL | Status: AC
  Administered 2022-11-12: 500 mg via ORAL
  Filled 2022-11-12: qty 2

## 2022-11-12 MED ORDER — IOHEXOL 350 MG/ML SOLN
75.0000 mL | Freq: Once | INTRAVENOUS | Status: AC | PRN
  Administered 2022-11-12: 75 mL via INTRAVENOUS

## 2022-11-12 MED ORDER — SODIUM CHLORIDE 0.9 % IV SOLN
1.0000 g | Freq: Every day | INTRAVENOUS | Status: DC
  Administered 2022-11-13: 1 g via INTRAVENOUS
  Filled 2022-11-12 (×2): qty 10

## 2022-11-12 MED ORDER — BUSPIRONE HCL 5 MG PO TABS
7.5000 mg | ORAL_TABLET | Freq: Two times a day (BID) | ORAL | Status: DC
  Administered 2022-11-12 – 2022-11-13 (×3): 7.5 mg via ORAL
  Filled 2022-11-12 (×2): qty 2
  Filled 2022-11-12 (×3): qty 1

## 2022-11-12 MED ORDER — ROSUVASTATIN CALCIUM 20 MG PO TABS
20.0000 mg | ORAL_TABLET | Freq: Every day | ORAL | Status: DC
  Administered 2022-11-12 – 2022-11-13 (×2): 20 mg via ORAL
  Filled 2022-11-12 (×2): qty 1

## 2022-11-12 MED ORDER — OXYCODONE HCL 5 MG PO TABS
5.0000 mg | ORAL_TABLET | Freq: Four times a day (QID) | ORAL | Status: DC | PRN
  Administered 2022-11-12 (×3): 5 mg via ORAL
  Filled 2022-11-12 (×3): qty 1

## 2022-11-12 MED ORDER — SODIUM PHOSPHATES 45 MMOLE/15ML IV SOLN
30.0000 mmol | Freq: Once | INTRAVENOUS | Status: AC
  Administered 2022-11-12: 30 mmol via INTRAVENOUS
  Filled 2022-11-12: qty 10

## 2022-11-12 MED ORDER — ENOXAPARIN SODIUM 40 MG/0.4ML IJ SOSY
40.0000 mg | PREFILLED_SYRINGE | Freq: Every day | INTRAMUSCULAR | Status: DC
  Administered 2022-11-12 – 2022-11-13 (×2): 40 mg via SUBCUTANEOUS
  Filled 2022-11-12 (×2): qty 0.4

## 2022-11-12 NOTE — Progress Notes (Addendum)
H/o PAD with Redo exposure left femoral artery with iliofemoral endarterectomy with bovine pericardial patch  in 2022, COPD on home o2 2.5liters, cigarette smoking presented with short of breath received prednisone from PCP without improvement increase home O2 from 2.5 L to 4 L, he called EMS was given IV Solu-Medrol and DuoNeb, CT angio negative for PE, multiple mucous filled bronchi in lower lobes bilaterally worse on the right than the left, WBC initially 10.8, on repeat 9.0, continue Zithromax, and Rocephin, check procalcitonin, sputum culture, continue steroids, nebs, remain bilateral diffuse wheezing, no sign of volume overload on exam, reports normally able to walk to the bathroom, reports currently too sob , not able to do that .  High-sensitivity troponin mild and flat, likely demand ischemia in the setting of COPD exacerbation No acute changes on EKG, denies chest pain  Mild hyponatremia improved on hydration Hypophosphatemia, being replaced  Prediabetes/versus early diabetes with hyperglycemia due to steroid A1c 5.7-6.1 in last few years, repeat A1c pending Diet modification   Incidental CT findings 4.2 cm infrarenal abdominal aortic aneurysm, recommend follow-up every 76-month and vascular consultation  6 mm solitary pulmonary nodule right upper lobe, recommend noncontrast CT 6 to 47-month done on order noncontrast CT 18 to 30-months  Stable 1 cm right lower lobe nodule unchanged over multiple previous exam felt to be benign in etiology

## 2022-11-12 NOTE — H&P (Addendum)
History and Physical  Christopher Pena [id number] DOB: August 18, 1946 DOA: 11/11/2022  Referring physician: Humberto Leep, PA-EDP. PCP: [name], MD  Outpatient Specialists: Pulmonology, vascular surgery, Patient coming from: Home.  Chief Complaint: Shortness of breath.  HPI: Christopher Pena is a 77 y.o. male with medical history significant for peripheral artery disease status post stent placement, COPD, chronic hypoxia on 2.5 L nasal cannula continuously, hypertension, chronic anxiety/depression, tobacco use disorder, who presented to [facility] ED from home due to progressive shortness of breath with minimal exertion for the past few days.  Associated with increase in oxygen requirement, up to 5 L nasal cannula.  EMS was activated.  Upon EMS arrival, the patient was noted to be significantly tachypneic.  He received IV Solu-Medrol, DuoNebs en route via EMS.  In the ED, the patient reported that his oxygen supplementation machine had malfunctioning issues.    On physical exam no evidence of volume overload.  He had a CT angio which was negative for pulmonary embolism.  Due to concern for acute COPD exacerbation and persistent symptomatology, EDP requested admission for further management.  The patient was admitted by [facility], hospitalist service.  ED Course: Tmax 98.  BP 160/89, pulse 91, respiratory 18, O2 saturation of 99% on 4 L.  Lab studies remarkable for Na+ 132, serum glucose 151.  Hemoglobin 12.2.  BNP 271.  Troponin 42.  Review of Systems: Review of systems as noted in the HPI. All other systems reviewed and are negative.  Wbc 10.8.   Past Medical History:  Diagnosis Date   Anxiety    Chronic low back pain    "degenerative spine dx'd ~ 7 yr ago" (06/08/2013)   Claudication [facility])    Constipation due to pain medication    COPD (chronic obstructive pulmonary disease) ([place])    Hypertension    "moderately high; RX didn't help" (06/08/2013) - no longer on meds (as of 09/19/16)    Neuromuscular disorder ([place])    degen spine   Non-healing wound of lower extremity 06/09/2013   PAD (peripheral artery disease), PTA/stent IDEV & chocolate baloon 06/08/13 04/13/2013   Severely reduced ABI's of 0.3 bilaterally    Tobacco use 06/09/2013   Unexplained weight loss    Past Surgical History:  Procedure Laterality Date   ABDOMINAL AORTAGRAM  05/10/2013   Procedure: ABDOMINAL Maxcine Ham;  Surgeon: Lorretta Harp, MD;  Location[facility] CATH LAB;  Service: Cardiovascular;;   ABI     04/09/13; abnormal   ANGIOPLASTY Left 05/21/2016   Procedure: ballon ANGIOPLASTY of femoral to anterior to tibial bypass graft;  Surgeon: Serafina Mitchell, MD;  Location: [facility] OR;  Service: Vascular;  Laterality: Left;   APPENDECTOMY  05/2001   ENDARTERECTOMY FEMORAL Left 12/08/2020   Procedure: REDO LEFT FEMORAL ARTERY ENDARTERECTOMY, LEFT FEMORAL ARTERY THROMBECTOMY;  Surgeon: Serafina Mitchell, MD;  Location: [facility] OR;  Service: Vascular;  Laterality: Left;   FEMORAL ARTERY STENT Right 05/10/13   2 stents Rt SFA   FEMORAL ARTERY STENT Left 06/08/2013   FEMORAL-POPLITEAL BYPASS GRAFT Right 11/12/2013   Procedure: RIGHT  FEMORAL-BELOW KNEE POPLITEAL ARTERY BYPASS GRAFT USING NON- REVERSE RIGHT GREATER SAPHENOUS VEIN.;  Surgeon: Serafina Mitchell, MD;  Location: [place];  Service: Vascular;  Laterality: Right;   FEMORAL-TIBIAL BYPASS GRAFT Left 12/08/2015   Procedure:  LEFT FEMORAL-ANTERIOR TIBIAL ARTERY BYPASS GRAFT;  Surgeon: Serafina Mitchell, MD;  Location: [place];  Service: Vascular;  Laterality: Left;   FEMORAL-TIBIAL BYPASS GRAFT Left 09/20/2016  Procedure: REDO BYPASS GRAFT FEMORAL-ANTERIOR TIBIAL ARTERY;  Surgeon: Serafina Mitchell, MD;  Location: [place];  Service: Vascular;  Laterality: Left;   I & D EXTREMITY Left 07/05/2021   Procedure: IRRIGATION AND DEBRIDEMENT LEFT LOWER EXTREMITY;  Surgeon: Broadus John, MD;  Location: [place];  Service: Vascular;  Laterality: Left;   I & D EXTREMITY Left 07/10/2021    Procedure: IRRIGATION AND DEBRIDEMENT LEFT LEG WOUND;  Surgeon: Broadus John, MD;  Location: [place];  Service: Vascular;  Laterality: Left;   LOWER EXTREMITY ANGIOGRAM Bilateral 05/10/2013   Procedure: LOWER EXTREMITY ANGIOGRAM;  Surgeon: Lorretta Harp, MD;  Location: [facility] CATH LAB;  Service: Cardiovascular;  Laterality: Bilateral;   LOWER EXTREMITY ANGIOGRAM N/A 06/08/2013   Procedure: LOWER EXTREMITY ANGIOGRAM;  Surgeon: Lorretta Harp, MD;  Location: [facility] CATH LAB;  Service: Cardiovascular;  Laterality: N/A;   LOWER EXTREMITY ANGIOGRAM N/A 10/28/2013   Procedure: LOWER EXTREMITY ANGIOGRAM;  Surgeon: Lorretta Harp, MD;  Location: [facility] CATH LAB;  Service: Cardiovascular;  Laterality: N/A;   LOWER EXTREMITY ANGIOGRAM N/A 05/16/2014   Procedure: LOWER EXTREMITY ANGIOGRAM;  Surgeon: Lorretta Harp, MD;  Location: [facility] CATH LAB;  Service: Cardiovascular;  Laterality: N/A;   PATCH ANGIOPLASTY Left 12/08/2020   Procedure: WITH PATCH ANGIOPLASTY USING 1 X 6 CM XENOSURE BIOLOGIC PATCH;  Surgeon: Serafina Mitchell, MD;  Location: [place];  Service: Vascular;  Laterality: Left;   PATCH ANGIOPLASTY Left 07/05/2021   Procedure: ANTERIOR TIBIAL ARTERY PATCH USING GREATER SAPHENOUS VEIN;  Surgeon: Broadus John, MD;  Location: [place];  Service: Vascular;  Laterality: Left;   PERCUTANEOUS STENT INTERVENTION Right 05/10/2013   Procedure: PERCUTANEOUS STENT INTERVENTION;  Surgeon: Lorretta Harp, MD;  Location: [facility] CATH LAB;  Service: Cardiovascular;  Laterality: Right;  rt sfa and popliteal stent x2   PERIPHERAL VASCULAR CATHETERIZATION N/A 12/06/2015   Procedure: Abdominal Aortogram w/Lower Extremity;  Surgeon: Serafina Mitchell, MD;  Location: [place] CV LAB;  Service: Cardiovascular;  Laterality: N/A;   PERIPHERAL VASCULAR CATHETERIZATION N/A 05/22/2016   Procedure: Abdominal Aortogram w/Lower Extremity;  Surgeon: Waynetta Sandy, MD;  Location: [place] CV LAB;  Service: Cardiovascular;   Laterality: N/A;   PERIPHERAL VASCULAR CATHETERIZATION N/A 09/17/2016   Procedure: Abdominal Aortogram w/Lower Extremity;  Surgeon: Serafina Mitchell, MD;  Location: [place] CV LAB;  Service: Cardiovascular;  Laterality: N/A;   REMOVAL OF GRAFT Left 07/05/2021   Procedure: RESECTION OF LEFT LOWER EXTREMITY BYPASS GRAFT;  Surgeon: Broadus John, MD;  Location: [place];  Service: Vascular;  Laterality: Left;   THROMBECTOMY FEMORAL ARTERY Left 05/21/2016   Procedure: THROMBECTOMY FEMORAL TO ANTERIOR TIBIAL BYPASS GRAFT;  Surgeon: Serafina Mitchell, MD;  Location: [place];  Service: Vascular;  Laterality: Left;   VEIN HARVEST Left 12/08/2015   Procedure: USING NON REVERSE  LEFT GREATER SAPHENOUS VEIN;  Surgeon: Serafina Mitchell, MD;  Location: [place];  Service: Vascular;  Laterality: Left;   VEIN HARVEST Left 07/05/2021   Procedure: VEIN HARVEST LEFT GREATER SAPHENOUS;  Surgeon: Broadus John, MD;  Location: [place];  Service: Vascular;  Laterality: Left;    Social History:  reports that he quit smoking about 6 years ago. His smoking use included cigarettes. He started smoking about 7 years ago. He has a 12.50 pack-year smoking history. He has never used smokeless tobacco. He reports that he does not drink alcohol and does not use drugs.   Allergies  Allergen  Reactions   Clindamycin/Lincomycin Diarrhea    Family History  Problem Relation Age of Onset   Hyperlipidemia Mother    Heart disease Mother    Diabetes Mother    Cancer Father        Liver      Prior to Admission medications   Medication Sig Start Date End Date Taking? Authorizing Provider  acetaminophen (TYLENOL) 500 MG tablet Take 500 mg by mouth every 6 (six) hours as needed for headache.    [provider]  albuterol (VENTOLIN HFA) 108 (90 Base) MCG/ACT inhaler INHALE TWO PUFFS BY MOUTH INTO LUNGS every SIX hours AS NEEDED FOR WHEEZING AND/OR SHORTNESS OF BREATH 10/14/22   [name], MD  aspirin EC 81 MG EC  tablet Take 1 tablet (81 mg total) by mouth daily at 6 (six) AM. Swallow whole. 12/11/20   Rhyne, Hulen Shouts, PA-C  busPIRone (BUSPAR) 7.5 MG tablet Take 1 tablet (7.5 mg total) by mouth 2 (two) times daily. 04/04/22   [name], MD  clopidogrel (PLAVIX) 75 MG tablet TAKE ONE TABLET BY MOUTH ONCE DAILY 07/01/22   [name], MD  FLUAD QUADRIVALENT 0.5 ML injection  06/13/22   [provider]  ipratropium-albuterol (DUONEB) 0.5-2.5 (3) MG/3ML SOLN TAKE THREE MLS VIA NEBULIZER EVERY 6 HOURS AS NEEDED Patient taking differently: Inhale 3 mLs into the lungs every 6 (six) hours as needed (shortness of breath). 02/11/20   Tanda Rockers, MD  naloxone [facility]) nasal spray 4 mg/0.1 mL SMARTSIG:In Nostril 06/12/21   [provider]  oxyCODONE (ROXICODONE) 15 MG immediate release tablet Take 1 tablet (15 mg total) by mouth every 6 (six) hours as needed for pain. 07/12/21   Dagoberto Ligas, PA-C  predniSONE (DELTASONE) 10 MG tablet 3 tabs x3 days and then 2 tabs x3 days and then 1 tab x3 days.  Take w/ food. 07/26/22   [name], MD  rosuvastatin (CRESTOR) 20 MG tablet TAKE ONE TABLET BY MOUTH ONCE DAILY 09/20/22   [name], MD  sertraline (ZOLOFT) 25 MG tablet TAKE ONE TABLET BY MOUTH ONCE DAILY 04/04/22   [name], MD  [facility] syringe  06/13/22   [provider]    Physical Exam: BP (!) 160/89 (BP Location: Right Arm)   Pulse 91   Temp 98 F (36.7 C) (Oral)   Resp 18   Ht 5\' 7"  (1.702 m)   Wt 65.8 kg   SpO2 99%   BMI 22.71 kg/m   General: 77 y.o. year-old male well developed well nourished in no acute distress.  Alert and oriented x3. Cardiovascular: Mild rales at bases, diffused wheezing bilaterally.  No thyromegaly or JVD noted.  No lower extremity edema. 2/4 pulses in all 4 extremities. Respiratory: Clear to auscultation with no wheezes or rales. Good inspiratory effort. Abdomen: Soft nontender nondistended with normal  bowel sounds x4 quadrants. Muskuloskeletal: No cyanosis, clubbing or edema noted bilaterally Neuro: CN II-XII intact, strength, sensation, reflexes Skin: No ulcerative lesions noted or rashes Psychiatry: Judgement and insight appear normal. Mood is appropriate for condition and setting          Labs on Admission:  Basic Metabolic Panel: Recent Labs  Lab 11/11/22 2242  NA 132*  K 3.7  CL 93*  CO2 26  GLUCOSE 151*  BUN 8  CREATININE 0.82  CALCIUM 8.8*   Liver Function Tests: No results for input(s): "AST", "ALT", "ALKPHOS", "BILITOT", "PROT", "ALBUMIN" in the last 168 hours.  No results for input(s): "LIPASE", "AMYLASE" in the last 168 hours. No results for input(s): "AMMONIA" in the last 168 hours. CBC: Recent Labs  Lab 11/11/22 2242  WBC 10.8*  NEUTROABS 9.7*  HGB 12.2*  HCT 39.2  MCV 92.7  PLT 273   Cardiac Enzymes: No results for input(s): "CKTOTAL", "CKMB", "CKMBINDEX", "TROPONINI" in the last 168 hours.  BNP (last 3 results) Recent Labs    11/11/22 2242  BNP 271.5*    ProBNP (last 3 results) No results for input(s): "PROBNP" in the last 8760 hours.  CBG: No results for input(s): "GLUCAP" in the last 168 hours.  Radiological Exams on Admission: CT Angio Chest PE W/Cm &/Or Wo Cm  Result Date: 11/12/2022 CLINICAL DATA:  Shortness of breath for several hours EXAM: CT ANGIOGRAPHY CHEST WITH CONTRAST TECHNIQUE: Multidetector CT imaging of the chest was performed using the standard protocol during bolus administration of intravenous contrast. Multiplanar CT image reconstructions and MIPs were obtained to evaluate the vascular anatomy. RADIATION DOSE REDUCTION: This exam was performed according to the departmental dose-optimization program which includes automated exposure control, adjustment of the mA and/or kV according to patient size and/or use of iterative reconstruction technique. CONTRAST:  23mL OMNIPAQUE IOHEXOL 350 MG/ML SOLN COMPARISON:  07/08/2021  FINDINGS: Cardiovascular: Atherosclerotic calcifications of the thoracic aorta are noted. No aneurysmal dilatation is seen. The degree of opacification is limited precluding adequate evaluation for dissection. The pulmonary artery shows a normal branching pattern bilaterally. No filling defects are identified to suggest pulmonary embolism. Mild coronary calcifications are seen. Mediastinum/Nodes: Thoracic inlet is within normal limits. No hilar or mediastinal adenopathy is noted. The esophagus as visualized is within normal limits. Lungs/Pleura: Lungs are well aerated bilaterally. Mild emphysematous changes are seen. Apical scarring is again noted particularly on the right stable from the prior exam. Nodular density is noted along the major fissure superiorly measuring 6 mm. This is best visualized on image number 46 of series 6 stable subpleural nodule in the right lower lobe is noted measuring 10 mm best seen on image number 115 of series 6. This is stable from prior exams and felt to be benign in etiology. Mucous filled bronchi are noted in the lower lobes bilaterally right greater than left. Upper Abdomen: Visualized upper abdomen demonstrates aneurysmal dilatation of the infrarenal aorta with considerable mural thrombus. This measures up to 4.2 cm in greatest dimension. This is slightly larger than that seen on a prior exam from 12/06/2020. Musculoskeletal: No acute rib abnormality is noted. Degenerative changes of the thoracic spine are noted. Review of the MIP images confirms the above findings. IMPRESSION: No evidence of pulmonary emboli. 4.2 cm infrarenal abdominal aortic aneurysm. Recommend follow-up every 12 months and vascular consultation. Reference: J Am Coll Radiol [id number]. 6 mm solid pulmonary nodule in the right upper lobe. Per Fleischner Society Guidelines, recommend a non-contrast Chest CT at 6-12 months, then another non-contrast Chest CT at 18-24 months. These guidelines do not apply to  immunocompromised patients and patients with cancer. Follow up in patients with significant comorbidities as clinically warranted. For lung cancer screening, adhere to Lung-RADS guidelines. Reference: Radiology. 2017; 284(1):228-43. Stable 1 cm right lower lobe nodule. This is unchanged over multiple previous exams and felt to be benign in etiology. Multiple mucous filled bronchi in the lower lobes bilaterally worse on the right than the left. Electronically Signed   By: Inez Catalina M.D.   On: 11/12/2022 00:45   DG Chest Port 1 View  Result Date: 11/11/2022  CLINICAL DATA:  Shortness of breath since this afternoon. EXAM: PORTABLE CHEST 1 VIEW COMPARISON:  08/08/2020 FINDINGS: Heart size and pulmonary vascularity are normal. Emphysematous changes in the lungs. Diffuse interstitial fibrosis and peribronchial thickening suggesting chronic bronchitis. Bilateral apical pleural thickening, scarring, and calcification, likely postinflammatory. No developing airspace disease or consolidation. Nodular opacity in the right lower lung likely represents a prominent nipple shadow. No pleural effusions. No pneumothorax. Calcification of the aorta. IMPRESSION: Emphysematous changes, fibrosis, and chronic bronchitic changes in the lungs. Bilateral apical pleural thickening and calcification is unchanged and likely postinflammatory. No developing consolidation or edema. Electronically Signed   By: Lucienne Capers M.D.   On: 11/11/2022 22:58    EKG: I independently viewed the EKG done and my findings are as followed: Sinus rhythm rate of 84.  Nonspecific ST-T changes.  QTc 452.  Assessment/Plan Present on Admission:  COPD with acute exacerbation ([place])  Principal Problem:   COPD with acute exacerbation ([place])  COPD with acute exacerbation Presented with progressive dyspnea and increase in O2 requirement Continue IV Solu-Medrol, DuoNebs, add azithromycin. Incentive spirometer Mobilize as tolerated  Acute on chronic  hypoxic respiratory failure At baseline on 2.5 L nasal cannula continuously Currently on 5 L due to severe tachypnea With normal oxygen supplementation as tolerated. Maintain O2 saturation greater than 90% CTA negative for PE.  Hyperglycemia, suspected exacerbated by steroids Heart healthy carb modified diet Obtain hemoglobin A1c Start insulin sliding scale.  Elevated troponin, suspect demand ischemia in the setting of acute COPD exacerbation First set of troponin 42, repeat 36. No evidence of acute ischemia on 12-lead EKG Monitor on telemetry.  Hypophosphatemia Serum phosphorus 2.0 Repleted intravenously Repeat level in the morning.  Chronic anxiety/depression Resume home buspirone and Zoloft  Hyperlipidemia Resume home Crestor  Peripheral artery disease status post stent placement Resume home regimen aspirin, Plavix and Crestor.  Physical debility PT OT assessment Fall precautions.  Chronic pain syndrome Resume home pain regimen at lower doses to avoid respiratory complications.   Moderate protein calorie malnutrition Albumin 3.4 BMI 22 Moderate muscle mass loss. Encourage oral protein calorie intake  Malfunctioning of home oxygen device. TOC consulted to assist    DVT prophylaxis: Subcu Lovenox daily  Code Status: Full code  Family Communication: None at bedside  Disposition Plan: Admitted to telemetry medical unit  Consults called: None.  Admission status: Observation status.   Status is: Observation    Kayleen Memos MD [facility] Pager [phone]  If 7PM-7AM, please contact night-coverage [email or web address] Password [email or web address]  11/12/2022, 1:27 AM

## 2022-11-12 NOTE — ED Notes (Signed)
ED TO INPATIENT HANDOFF REPORT  ED Nurse Name and Phone #: [phone]  S Name/Age/Gender Christopher Pena 77 y.o. male Room/Bed: 004C/004C  Code Status   Code Status: Full Code  Home/SNF/Other Home Patient oriented to: self, place, time, and situation Is this baseline? Yes   Triage Complete: Triage complete  Chief Complaint COPD with acute exacerbation [facility]) [J44.1]  Triage Note Pt arrives with [facility] since this afternoon. Pt reports feeling like he needed to increase his oxygen today due to being short of breath on exertion. Pt received 1 duo neb and solumedrol with EMS. EMS reports tachypnea and increased work of breathing. Pt wears 3-4L oxygen at baseline    Allergies Allergies  Allergen Reactions   Clindamycin/Lincomycin Diarrhea    Level of Care/Admitting Diagnosis ED Disposition     ED Disposition  Admit   Condition  --   [place]: [place] [[id number]]  Level of Care: Telemetry Medical [[id number]]  May place patient in observation at [facility] or [place] if equivalent level of care is available:: Yes  Covid Evaluation: Asymptomatic - no recent exposure (last 10 days) testing not required  Diagnosis: COPD with acute exacerbation [facility]) [id number]  Admitting Physician: Kayleen Memos [id number]  Attending Physician: Kayleen Memos [id number]          B Medical/Surgery History Past Medical History:  Diagnosis Date   Anxiety    Chronic low back pain    "degenerative spine dx'd ~ 7 yr ago" (06/08/2013)   Claudication ([place])    Constipation due to pain medication    COPD (chronic obstructive pulmonary disease) ([place])    Hypertension    "moderately high; RX didn't help" (06/08/2013) - no longer on meds (as of 09/19/16)   Neuromuscular disorder ([place])    degen spine   Non-healing wound of lower extremity 06/09/2013   PAD (peripheral artery disease), PTA/stent IDEV & chocolate baloon 06/08/13 04/13/2013   Severely reduced ABI's of  0.3 bilaterally    Tobacco use 06/09/2013   Unexplained weight loss    Past Surgical History:  Procedure Laterality Date   ABDOMINAL AORTAGRAM  05/10/2013   Procedure: ABDOMINAL Maxcine Ham;  Surgeon: Lorretta Harp, MD;  Location: [facility] CATH LAB;  Service: Cardiovascular;;   ABI     04/09/13; abnormal   ANGIOPLASTY Left 05/21/2016   Procedure: ballon ANGIOPLASTY of femoral to anterior to tibial bypass graft;  Surgeon: Serafina Mitchell, MD;  Location: [facility] OR;  Service: Vascular;  Laterality: Left;   APPENDECTOMY  05/2001   ENDARTERECTOMY FEMORAL Left 12/08/2020   Procedure: REDO LEFT FEMORAL ARTERY ENDARTERECTOMY, LEFT FEMORAL ARTERY THROMBECTOMY;  Surgeon: Serafina Mitchell, MD;  [facility] OR;  Service: Vascular;  Laterality: Left;   FEMORAL ARTERY STENT Right 05/10/13   2 stents Rt SFA   FEMORAL ARTERY STENT Left 06/08/2013   FEMORAL-POPLITEAL BYPASS GRAFT Right 11/12/2013   Procedure: RIGHT  FEMORAL-BELOW KNEE POPLITEAL ARTERY BYPASS GRAFT USING NON- REVERSE RIGHT GREATER SAPHENOUS VEIN.;  Surgeon: Serafina Mitchell, MD;  Location: [place];  Service: Vascular;  Laterality: Right;   FEMORAL-TIBIAL BYPASS GRAFT Left 12/08/2015   Procedure:  LEFT FEMORAL-ANTERIOR TIBIAL ARTERY BYPASS GRAFT;  Surgeon: Serafina Mitchell, MD;  Location: [place];  Service: Vascular;  Laterality: Left;   FEMORAL-TIBIAL BYPASS GRAFT Left 09/20/2016   Procedure: REDO BYPASS GRAFT FEMORAL-ANTERIOR TIBIAL ARTERY;  Surgeon: Serafina Mitchell, MD;  Location: [place];  Service: Vascular;  Laterality: Left;  I & D EXTREMITY Left 07/05/2021   Procedure: IRRIGATION AND DEBRIDEMENT LEFT LOWER EXTREMITY;  Surgeon: Broadus John, MD;  Location: [place];  Service: Vascular;  Laterality: Left;   I & D EXTREMITY Left 07/10/2021   Procedure: IRRIGATION AND DEBRIDEMENT LEFT LEG WOUND;  Surgeon: Broadus John, MD;  Location: [place];  Service: Vascular;  Laterality: Left;   LOWER EXTREMITY ANGIOGRAM Bilateral 05/10/2013   Procedure: LOWER EXTREMITY  ANGIOGRAM;  Surgeon: Lorretta Harp, MD;  Location: [facility] CATH LAB;  Service: Cardiovascular;  Laterality: Bilateral;   LOWER EXTREMITY ANGIOGRAM N/A 06/08/2013   Procedure: LOWER EXTREMITY ANGIOGRAM;  Surgeon: Lorretta Harp, MD;  Location: [facility] CATH LAB;  Service: Cardiovascular;  Laterality: N/A;   LOWER EXTREMITY ANGIOGRAM N/A 10/28/2013   Procedure: LOWER EXTREMITY ANGIOGRAM;  Surgeon: Lorretta Harp, MD;  Location: [facility] CATH LAB;  Service: Cardiovascular;  Laterality: N/A;   LOWER EXTREMITY ANGIOGRAM N/A 05/16/2014   Procedure: LOWER EXTREMITY ANGIOGRAM;  Surgeon: Lorretta Harp, MD;  Location: [facility] CATH LAB;  Service: Cardiovascular;  Laterality: N/A;   PATCH ANGIOPLASTY Left 12/08/2020   Procedure: WITH PATCH ANGIOPLASTY USING 1 X 6 CM XENOSURE BIOLOGIC PATCH;  Surgeon: Serafina Mitchell, MD;  Location: [place];  Service: Vascular;  Laterality: Left;   PATCH ANGIOPLASTY Left 07/05/2021   Procedure: ANTERIOR TIBIAL ARTERY PATCH USING GREATER SAPHENOUS VEIN;  Surgeon: Broadus John, MD;  Location: [place];  Service: Vascular;  Laterality: Left;   PERCUTANEOUS STENT INTERVENTION Right 05/10/2013   Procedure: PERCUTANEOUS STENT INTERVENTION;  Surgeon: Lorretta Harp, MD;  Location: [facility] CATH LAB;  Service: Cardiovascular;  Laterality: Right;  rt sfa and popliteal stent x2   PERIPHERAL VASCULAR CATHETERIZATION N/A 12/06/2015   Procedure: Abdominal Aortogram w/Lower Extremity;  Surgeon: Serafina Mitchell, MD;  Location: [place] CV LAB;  Service: Cardiovascular;  Laterality: N/A;   PERIPHERAL VASCULAR CATHETERIZATION N/A 05/22/2016   Procedure: Abdominal Aortogram w/Lower Extremity;  Surgeon: Waynetta Sandy, MD;  Location: [place] CV LAB;  Service: Cardiovascular;  Laterality: N/A;   PERIPHERAL VASCULAR CATHETERIZATION N/A 09/17/2016   Procedure: Abdominal Aortogram w/Lower Extremity;  Surgeon: Serafina Mitchell, MD;  Location: [place] CV LAB;  Service: Cardiovascular;  Laterality: N/A;    REMOVAL OF GRAFT Left 07/05/2021   Procedure: RESECTION OF LEFT LOWER EXTREMITY BYPASS GRAFT;  Surgeon: Broadus John, MD;  Location: [place];  Service: Vascular;  Laterality: Left;   THROMBECTOMY FEMORAL ARTERY Left 05/21/2016   Procedure: THROMBECTOMY FEMORAL TO ANTERIOR TIBIAL BYPASS GRAFT;  Surgeon: Serafina Mitchell, MD;  Location: [place];  Service: Vascular;  Laterality: Left;   VEIN HARVEST Left 12/08/2015   Procedure: USING NON REVERSE  LEFT GREATER SAPHENOUS VEIN;  Surgeon: Serafina Mitchell, MD;  Location: [place];  Service: Vascular;  Laterality: Left;   VEIN HARVEST Left 07/05/2021   Procedure: VEIN HARVEST LEFT GREATER SAPHENOUS;  Surgeon: Broadus John, MD;  Location: [facility] OR;  Service: Vascular;  Laterality: Left;     A IV Location/Drains/Wounds Patient Lines/Drains/Airways Status     Active Line/Drains/Airways     Name Placement date Placement time Site Days   Peripheral IV 11/11/22 20 G Left Antecubital 11/11/22  2156  Antecubital  1   Peripheral IV 11/12/22 22 G Anterior;Distal;Left Forearm 11/12/22  1113  Forearm  less than 1   Incision (Closed) 07/05/21 Leg Left 07/05/21  2141  -- 495   Incision (Closed) 07/05/21 Thigh Left 07/05/21  2205  -- 495   Incision (Closed) 07/05/21 Groin Left 07/05/21  2205  -- 495   Incision (Closed) 07/05/21 Leg Left 07/05/21  2219  -- 495   Incision (Closed) 07/05/21 Leg 07/05/21  2219  -- 495   Incision (Closed) 07/05/21 Thigh Left 07/05/21  2221  -- 495   Incision (Closed) 07/10/21 Leg Left 07/10/21  0819  -- 490            Intake/Output Last 24 hours  Intake/Output Summary (Last 24 hours) at 11/12/2022 1548 Last data filed at 11/12/2022 1158 Gross per 24 hour  Intake 100 ml  Output --  Net 100 ml    Labs/Imaging Results for orders placed or performed during the hospital encounter of 11/11/22 (from the past 48 hour(s))  Resp panel by RT-PCR (RSV, Flu A&B, Covid) Anterior Nasal Swab     Status: None   Collection Time: 11/11/22  10:37 PM   Specimen: Anterior Nasal Swab  Result Value Ref Range   SARS Coronavirus 2 by RT PCR NEGATIVE NEGATIVE   Influenza A by PCR NEGATIVE NEGATIVE   Influenza B by PCR NEGATIVE NEGATIVE    Comment: (NOTE) The Xpert Xpress SARS-CoV-2/FLU/RSV plus assay is intended as an aid in the diagnosis of influenza from Nasopharyngeal swab specimens and should not be used as a sole basis for treatment. Nasal washings and aspirates are unacceptable for Xpert Xpress SARS-CoV-2/FLU/RSV testing.  Fact Sheet for Patients: EntrepreneurPulse.com.au  Fact Sheet for Healthcare Providers: IncredibleEmployment.be  This test is not yet approved or cleared by the [place] FDA and has been authorized for detection and/or diagnosis of SARS-CoV-2 by FDA under an Emergency Use Authorization (EUA). This EUA will remain in effect (meaning this test can be used) for the duration of the COVID-19 declaration under Section 564(b)(1) of the Act, 21 U.S.C. section 360bbb-3(b)(1), unless the authorization is terminated or revoked.     Resp Syncytial Virus by PCR NEGATIVE NEGATIVE    Comment: (NOTE) Fact Sheet for Patients: EntrepreneurPulse.com.au  Fact Sheet for Healthcare Providers: IncredibleEmployment.be  This test is not yet approved or cleared by the [place] FDA and has been authorized for detection and/or diagnosis of SARS-CoV-2 by FDA under an Emergency Use Authorization (EUA). This EUA will remain in effect (meaning this test can be used) for the duration of the COVID-19 declaration under Section 564(b)(1) of the Act, 21 U.S.C. section 360bbb-3(b)(1), unless the authorization is terminated or revoked.  Performed at [facility], [place] [address], [place], [place] [address]   CBC with Differential/Platelet     Status: Abnormal   Collection Time: 11/11/22 10:42 PM  Result Value Ref Range   WBC 10.8 (H) 4.0 -  10.5 K/uL   RBC 4.23 4.22 - 5.81 MIL/uL   Hemoglobin 12.2 (L) 13.0 - 17.0 g/dL   HCT 39.2 39.0 - 52.0 %   MCV 92.7 80.0 - 100.0 fL   MCH 28.8 26.0 - 34.0 pg   MCHC 31.1 30.0 - 36.0 g/dL   RDW 13.8 11.5 - 15.5 %   Platelets 273 150 - 400 K/uL   nRBC 0.0 0.0 - 0.2 %   Neutrophils Relative % 89 %   Neutro Abs 9.7 (H) 1.7 - 7.7 K/uL   Lymphocytes Relative 5 %   Lymphs Abs 0.5 (L) 0.7 - 4.0 K/uL   Monocytes Relative 3 %   Monocytes Absolute 0.3 0.1 - 1.0 K/uL   Eosinophils Relative 1 %   Eosinophils Absolute 0.1  0.0 - 0.5 K/uL   Basophils Relative 1 %   Basophils Absolute 0.1 0.0 - 0.1 K/uL   Immature Granulocytes 1 %   Abs Immature Granulocytes 0.05 0.00 - 0.07 K/uL    Comment: Performed at [facility] [address], [place], [place] [id number]  Basic metabolic panel     Status: Abnormal   Collection Time: 11/11/22 10:42 PM  Result Value Ref Range   Sodium 132 (L) 135 - 145 mmol/L   Potassium 3.7 3.5 - 5.1 mmol/L   Chloride 93 (L) 98 - 111 mmol/L   CO2 26 22 - 32 mmol/L   Glucose, Bld 151 (H) 70 - 99 mg/dL    Comment: Glucose reference range applies only to samples taken after fasting for at least 8 hours.   BUN 8 8 - 23 mg/dL   Creatinine, Ser 0.82 0.61 - 1.24 mg/dL   Calcium 8.8 (L) 8.9 - 10.3 mg/dL   GFR, Estimated >60 >60 mL/min    Comment: (NOTE) Calculated using the CKD-EPI Creatinine Equation (2021)    Anion gap 13 5 - 15    Comment: Performed at [facility] [address], [place], [place] [address]  Brain natriuretic peptide     Status: Abnormal   Collection Time: 11/11/22 10:42 PM  Result Value Ref Range   B Natriuretic Peptide 271.5 (H) 0.0 - 100.0 pg/mL    Comment: Performed at [facility] [address], [place], [place] [address]  Troponin I (High Sensitivity)     Status: Abnormal   Collection Time: 11/11/22 10:42 PM  Result Value Ref Range   Troponin I (High Sensitivity) 42 (H) <18 ng/L    Comment: (NOTE) Elevated high sensitivity  troponin I (hsTnI) values and significant  changes across serial measurements may suggest ACS but many other  chronic and acute conditions are known to elevate hsTnI results.  Refer to the "Links" section for chest pain algorithms and additional  guidance. Performed at [facility], [place] [address], [place], [place] [address]   Troponin I (High Sensitivity)     Status: Abnormal   Collection Time: 11/12/22  1:08 AM  Result Value Ref Range   Troponin I (High Sensitivity) 36 (H) <18 ng/L    Comment: (NOTE) Elevated high sensitivity troponin I (hsTnI) values and significant  changes across serial measurements may suggest ACS but many other  chronic and acute conditions are known to elevate hsTnI results.  Refer to the "Links" section for chest pain algorithms and additional  guidance. Performed at [facility], [place] [address], [place],  [address]   CBG monitoring, ED     Status: Abnormal   Collection Time: 11/12/22  2:02 AM  Result Value Ref Range   Glucose-Capillary 159 (H) 70 - 99 mg/dL    Comment: Glucose reference range applies only to samples taken after fasting for at least 8 hours.  CBC     Status: Abnormal   Collection Time: 11/12/22  3:03 AM  Result Value Ref Range   WBC 9.0 4.0 - 10.5 K/uL   RBC 4.29 4.22 - 5.81 MIL/uL   Hemoglobin 12.5 (L) 13.0 - 17.0 g/dL   HCT 39.1 39.0 - 52.0 %   MCV 91.1 80.0 - 100.0 fL   MCH 29.1 26.0 - 34.0 pg   MCHC 32.0 30.0 - 36.0 g/dL   RDW 13.7 11.5 - 15.5 %   Platelets 262 150 - 400 K/uL  nRBC 0.0 0.0 - 0.2 %    Comment: Performed at [facility], [place] [address], [place], [place] [address]  Comprehensive metabolic panel     Status: Abnormal   Collection Time: 11/12/22  3:03 AM  Result Value Ref Range   Sodium 133 (L) 135 - 145 mmol/L   Potassium 4.2 3.5 - 5.1 mmol/L   Chloride 96 (L) 98 - 111 mmol/L   CO2 26 22 - 32 mmol/L   Glucose, Bld 202 (H) 70 - 99 mg/dL    Comment: Glucose reference range applies only  to samples taken after fasting for at least 8 hours.   BUN 7 (L) 8 - 23 mg/dL   Creatinine, Ser 0.92 0.61 - 1.24 mg/dL   Calcium 8.7 (L) 8.9 - 10.3 mg/dL   Total Protein 7.5 6.5 - 8.1 g/dL   Albumin 3.4 (L) 3.5 - 5.0 g/dL   AST 30 15 - 41 U/L   ALT 13 0 - 44 U/L   Alkaline Phosphatase 66 38 - 126 U/L   Total Bilirubin 0.7 0.3 - 1.2 mg/dL   GFR, Estimated >60 >60 mL/min    Comment: (NOTE) Calculated using the CKD-EPI Creatinine Equation (2021)    Anion gap 11 5 - 15    Comment: Performed at [facility], [place] [address], [place], [place] [address]  Magnesium     Status: None   Collection Time: 11/12/22  3:03 AM  Result Value Ref Range   Magnesium 1.9 1.7 - 2.4 mg/dL    Comment: Performed at [facility] [address], [place],  [address]  Phosphorus     Status: Abnormal   Collection Time: 11/12/22  3:03 AM  Result Value Ref Range   Phosphorus 2.0 (L) 2.5 - 4.6 mg/dL    Comment: Performed at [facility] [address], [place],  [address]  CBG monitoring, ED     Status: Abnormal   Collection Time: 11/12/22  8:44 AM  Result Value Ref Range   Glucose-Capillary 133 (H) 70 - 99 mg/dL    Comment: Glucose reference range applies only to samples taken after fasting for at least 8 hours.   Comment 1 Notify RN    Comment 2 Document in Chart   CBG monitoring, ED     Status: Abnormal   Collection Time: 11/12/22 11:57 AM  Result Value Ref Range   Glucose-Capillary 139 (H) 70 - 99 mg/dL    Comment: Glucose reference range applies only to samples taken after fasting for at least 8 hours.   CT Angio Chest PE W/Cm &/Or Wo Cm  Result Date: 11/12/2022 CLINICAL DATA:  Shortness of breath for several hours EXAM: CT ANGIOGRAPHY CHEST WITH CONTRAST TECHNIQUE: Multidetector CT imaging of the chest was performed using the standard protocol during bolus administration of intravenous contrast. Multiplanar CT image reconstructions and MIPs were obtained to evaluate the  vascular anatomy. RADIATION DOSE REDUCTION: This exam was performed according to the departmental dose-optimization program which includes automated exposure control, adjustment of the mA and/or kV according to patient size and/or use of iterative reconstruction technique. CONTRAST:  32mL OMNIPAQUE IOHEXOL 350 MG/ML SOLN COMPARISON:  07/08/2021 FINDINGS: Cardiovascular: Atherosclerotic calcifications of the thoracic aorta are noted. No aneurysmal dilatation is seen. The degree of opacification is limited precluding adequate evaluation for dissection. The pulmonary artery shows a normal branching pattern bilaterally. No filling defects are identified to suggest pulmonary embolism. Mild coronary calcifications are seen. Mediastinum/Nodes:  Thoracic inlet is within normal limits. No hilar or mediastinal adenopathy is noted. The esophagus as visualized is within normal limits. Lungs/Pleura: Lungs are well aerated bilaterally. Mild emphysematous changes are seen. Apical scarring is again noted particularly on the right stable from the prior exam. Nodular density is noted along the major fissure superiorly measuring 6 mm. This is best visualized on image number 46 of series 6 stable subpleural nodule in the right lower lobe is noted measuring 10 mm best seen on image number 115 of series 6. This is stable from prior exams and felt to be benign in etiology. Mucous filled bronchi are noted in the lower lobes bilaterally right greater than left. Upper Abdomen: Visualized upper abdomen demonstrates aneurysmal dilatation of the infrarenal aorta with considerable mural thrombus. This measures up to 4.2 cm in greatest dimension. This is slightly larger than that seen on a prior exam from 12/06/2020. Musculoskeletal: No acute rib abnormality is noted. Degenerative changes of the thoracic spine are noted. Review of the MIP images confirms the above findings. IMPRESSION: No evidence of pulmonary emboli. 4.2 cm infrarenal abdominal  aortic aneurysm. Recommend follow-up every 12 months and vascular consultation. Reference: J Am Coll Radiol [id number]. 6 mm solid pulmonary nodule in the right upper lobe. Per Fleischner Society Guidelines, recommend a non-contrast Chest CT at 6-12 months, then another non-contrast Chest CT at 18-24 months. These guidelines do not apply to immunocompromised patients and patients with cancer. Follow up in patients with significant comorbidities as clinically warranted. For lung cancer screening, adhere to Lung-RADS guidelines. Reference: Radiology. 2017; 284(1):228-43. Stable 1 cm right lower lobe nodule. This is unchanged over multiple previous exams and felt to be benign in etiology. Multiple mucous filled bronchi in the lower lobes bilaterally worse on the right than the left. Electronically Signed   By: Inez Catalina M.D.   On: 11/12/2022 00:45   DG Chest Port 1 View  Result Date: 11/11/2022 CLINICAL DATA:  Shortness of breath since this afternoon. EXAM: PORTABLE CHEST 1 VIEW COMPARISON:  08/08/2020 FINDINGS: Heart size and pulmonary vascularity are normal. Emphysematous changes in the lungs. Diffuse interstitial fibrosis and peribronchial thickening suggesting chronic bronchitis. Bilateral apical pleural thickening, scarring, and calcification, likely postinflammatory. No developing airspace disease or consolidation. Nodular opacity in the right lower lung likely represents a prominent nipple shadow. No pleural effusions. No pneumothorax. Calcification of the aorta. IMPRESSION: Emphysematous changes, fibrosis, and chronic bronchitic changes in the lungs. Bilateral apical pleural thickening and calcification is unchanged and likely postinflammatory. No developing consolidation or edema. Electronically Signed   By: Lucienne Capers M.D.   On: 11/11/2022 22:58    Pending Labs Unresulted Labs (From admission, onward)     Start     Ordered   11/19/22 0500  Creatinine, serum  (enoxaparin (LOVENOX)     CrCl >/= 30 ml/min)  Weekly,   R     Comments: while on enoxaparin therapy    11/12/22 0126   11/13/22 0500  Phosphorus  Tomorrow morning,   R        11/12/22 0459   11/13/22 [id number]  Basic metabolic panel  Tomorrow morning,   R        11/12/22 0500   11/13/22 0500  Procalcitonin  Tomorrow morning,   R       References:    Procalcitonin Lower Respiratory Tract Infection AND Sepsis Procalcitonin Algorithm   11/12/22 0815   11/12/22 0816  Expectorated Sputum Assessment w Gram Stain, Rflx to Resp  Cult  Once,   R        11/12/22 0815   11/12/22 0500  Hemoglobin A1c  Tomorrow morning,   R       Comments: To assess prior glycemic control    11/12/22 0144            Vitals/Pain Today's Vitals   11/12/22 1242 11/12/22 1243 11/12/22 1430 11/12/22 1515  BP:   108/63   Pulse:   70   Resp:   17   Temp:    98.4 F (36.9 C)  TempSrc:    Oral  SpO2:  98% 98%   Weight:      Height:      PainSc: 8        Isolation Precautions No active isolations  Medications Medications  enoxaparin (LOVENOX) injection 40 mg (40 mg Subcutaneous Given 11/12/22 0957)  methylPREDNISolone sodium succinate (SOLU-MEDROL) 40 mg/mL injection 40 mg (40 mg Intravenous Given 11/12/22 0238)  azithromycin (ZITHROMAX) tablet 500 mg (500 mg Oral Given 11/12/22 0235)    Followed by  azithromycin (ZITHROMAX) tablet 250 mg (has no administration in time range)  ipratropium-albuterol (DUONEB) 0.5-2.5 (3) MG/3ML nebulizer solution 3 mL (3 mLs Nebulization Given 11/12/22 1409)  guaiFENesin (MUCINEX) 12 hr tablet 600 mg (600 mg Oral Given 11/12/22 0958)  busPIRone (BUSPAR) tablet 7.5 mg (7.5 mg Oral Given 11/12/22 1138)  sertraline (ZOLOFT) tablet 25 mg (25 mg Oral Given 11/12/22 0959)  rosuvastatin (CRESTOR) tablet 20 mg (20 mg Oral Given 11/12/22 1140)  aspirin EC tablet 81 mg (81 mg Oral Given 11/12/22 0642)  clopidogrel (PLAVIX) tablet 75 mg (75 mg Oral Given 11/12/22 0959)  insulin aspart (novoLOG) injection 0-9 Units (1  Units Subcutaneous Given 11/12/22 1241)  insulin aspart (novoLOG) injection 0-5 Units ( Subcutaneous Not Given 11/12/22 0218)  oxyCODONE (Oxy IR/ROXICODONE) immediate release tablet 5 mg (5 mg Oral Given 11/12/22 1242)  cefTRIAXone (ROCEPHIN) 1 g in sodium chloride 0.9 % 100 mL IVPB (0 g Intravenous Stopped 11/12/22 1158)  iohexol (OMNIPAQUE) 350 MG/ML injection 75 mL (75 mLs Intravenous Contrast Given 11/12/22 0033)  sodium phosphate 30 mmol in dextrose 5 % 250 mL infusion (0 mmol Intravenous Stopped 11/12/22 1239)    Mobility walks with device     Focused Assessments Pulmonary Assessment Handoff:  Lung sounds: L Breath Sounds: Diminished R Breath Sounds: Diminished O2 Device: Nasal Cannula O2 Flow Rate (L/min): 4 L/min    R Recommendations: See Admitting Provider Note  Report given to:   Additional Notes: Patient walk with assist. Physical therapy work with him, but he said his gait was unsteady. Normally he was using 2L of oxygen at home, but is saturation would be dropping. Patient oxygen at home only go up to 2 Liters and he was putting 2 oxygen to make up for the 4. He is 4 liters, but sometime is oxygen will drop if he get work up in the 80's. Right now he is in the 90's he do get schedule breathing treatment.

## 2022-11-12 NOTE — Evaluation (Signed)
Physical Therapy Evaluation Patient Details Name: Christopher Pena MRN: [id number] DOB: 1946-08-19 Today's Date: 11/12/2022  History of Present Illness  77 y.o. male presents to [facility] hospital on 11/11/2022 with progressive SOB. Pt admitted for COPD exacerbation. PMH includes PAD, COPD, chronic hypoxia on 2.5L [place], HTN, anxiety/depression.  Clinical Impression  Pt presents to PT with deficits in functional mobility, gait, balance, endurance, strength, power. Pt reports DOE when mobilizing despite increased supplemental oxygen compared to baseline ans sats in low 90s. Pt demonstrate LE weakness and instability when ambulating, requiring intermittent assistance to correct lateral and anterior-posterior sway. Pt demonstrates poor management of RW, often with RW very close to thighs. Pt feels he will progress well and be able to discharge home, attributing current instability to his lack of mobility over the last 48 hours. Pt will benefit from aggressive mobilization in an effort to improve balance and to reduce falls risk. PT recommends discharge home with further post-acute PT services if the patients family can provide frequent assistance at the time of discharge.       Recommendations for follow up therapy are one component of a multi-disciplinary discharge planning process, led by the attending physician.  Recommendations may be updated based on patient status, additional functional criteria and insurance authorization.  Follow Up Recommendations       Assistance Recommended at Discharge Intermittent Supervision/Assistance  Patient can return home with the following  A little help with walking and/or transfers;A little help with bathing/dressing/bathroom;Assistance with cooking/housework;Assist for transportation;Help with stairs or ramp for entrance    Equipment Recommendations None recommended by PT  Recommendations for Other Services       Functional Status Assessment Patient has had a recent  decline in their functional status and demonstrates the ability to make significant improvements in function in a reasonable and predictable amount of time.     Precautions / Restrictions Precautions Precautions: Fall Precaution Comments: monitor sats Restrictions Weight Bearing Restrictions: No      Mobility  Bed Mobility Overal bed mobility: Needs Assistance Bed Mobility: Supine to Sit, Sit to Supine     Supine to sit: Supervision, HOB elevated Sit to supine: Supervision, HOB elevated        Transfers Overall transfer level: Needs assistance Equipment used: None Transfers: Sit to/from Stand Sit to Stand: Min assist           General transfer comment: posterior lean    Ambulation/Gait Ambulation/Gait assistance: Min assist Gait Distance (Feet): 20 Feet Assistive device: Rolling walker (2 wheels), None (5' without DME, 59' with RW) Gait Pattern/deviations: Step-to pattern, Wide base of support, Staggering left, Staggering right Gait velocity: reduced Gait velocity interpretation: <1.31 ft/sec, indicative of household ambulator   General Gait Details: pt with slowed step-to gait, high guard for initial steps without UE support, increased postural sway with posterior and lateral lean when utilizing RW. Poor walker management with thighs touching anterior bar of RW at times during gait  Stairs            Wheelchair Mobility    Modified Rankin (Stroke Patients Only)       Balance Overall balance assessment: Needs assistance Sitting-balance support: No upper extremity supported, Feet supported Sitting balance-Leahy Scale: Fair     Standing balance support: Single extremity supported, Bilateral upper extremity supported, Reliant on assistive device for balance Standing balance-Leahy Scale: Poor Standing balance comment: minA  Pertinent Vitals/Pain Pain Assessment Pain Assessment: No/denies pain    Home Living  Family/patient expects to be discharged to:: Private residence Living Arrangements: Alone Available Help at Discharge: Family;Available PRN/intermittently Type of Home: Apartment Home Access: Level entry       Home Layout: One level Home Equipment: Rollator (4 wheels)      Prior Function Prior Level of Function : Independent/Modified Independent;Driving             Mobility Comments: ambulates for household distances, utilizing UE support of furniture PRN. Reports he feels utilizing his rollator takes more of his energy       Hand Dominance   Dominant Hand: Right    Extremity/Trunk Assessment   Upper Extremity Assessment Upper Extremity Assessment: Overall WFL for tasks assessed    Lower Extremity Assessment Lower Extremity Assessment: Generalized weakness    Cervical / Trunk Assessment Cervical / Trunk Assessment: Normal  Communication   Communication: No difficulties  Cognition Arousal/Alertness: Awake/alert Behavior During Therapy: WFL for tasks assessed/performed Overall Cognitive Status: Impaired/Different from baseline Area of Impairment: Awareness, Safety/judgement                         Safety/Judgement: Decreased awareness of safety, Decreased awareness of deficits Awareness: Emergent            General Comments General comments (skin integrity, edema, etc.): pt on 5L [place] upon PT arrival with sats in mid-90s. PT weans to 4L [place] with mobility, sats from 89-92%, pt reports DOE. Pt placed back on 5L [place] at end of session.    Exercises     Assessment/Plan    PT Assessment Patient needs continued PT services  PT Problem List Decreased strength;Decreased balance;Decreased activity tolerance;Decreased mobility;Decreased knowledge of use of DME;Cardiopulmonary status limiting activity       PT Treatment Interventions DME instruction;Gait training;Functional mobility training;Therapeutic activities;Therapeutic exercise;Balance  training;Neuromuscular re-education;Patient/family education    PT Goals (Current goals can be found in the Care Plan section)  Acute Rehab PT Goals Patient Stated Goal: to improve activity tolerance and return home PT Goal Formulation: With patient Time For Goal Achievement: 11/26/22 Potential to Achieve Goals: Fair Additional Goals Additional Goal #1: Pt will score >45/56 on the BERG balance test to indicate a reduced risk for falls    Frequency Min 3X/week     Co-evaluation               AM-PAC PT "6 Clicks" Mobility  Outcome Measure Help needed turning from your back to your side while in a flat bed without using bedrails?: A Little Help needed moving from lying on your back to sitting on the side of a flat bed without using bedrails?: A Little Help needed moving to and from a bed to a chair (including a wheelchair)?: A Little Help needed standing up from a chair using your arms (e.g., wheelchair or bedside chair)?: A Little Help needed to walk in hospital room?: A Little Help needed climbing 3-5 steps with a railing? : A Lot 6 Click Score: 17    End of Session Equipment Utilized During Treatment: Oxygen Activity Tolerance: Patient limited by fatigue Patient left: in bed;with call bell/phone within reach Nurse Communication: Mobility status PT Visit Diagnosis: Other abnormalities of gait and mobility (R26.89);Muscle weakness (generalized) (M62.81)    Time: [id number] PT Time Calculation (min) (ACUTE ONLY): 27 min   Charges:   PT Evaluation $PT Eval Low Complexity: 1 Low  Zenaida Niece, PT, DPT Acute Rehabilitation Office [place] 11/12/2022, 9:58 AM

## 2022-11-13 ENCOUNTER — Other Ambulatory Visit (HOSPITAL_COMMUNITY): Payer: Self-pay

## 2022-11-13 DIAGNOSIS — I739 Peripheral vascular disease, unspecified: Secondary | ICD-10-CM | POA: Diagnosis not present

## 2022-11-13 DIAGNOSIS — J9621 Acute and chronic respiratory failure with hypoxia: Principal | ICD-10-CM

## 2022-11-13 DIAGNOSIS — J441 Chronic obstructive pulmonary disease with (acute) exacerbation: Secondary | ICD-10-CM | POA: Diagnosis not present

## 2022-11-13 LAB — BASIC METABOLIC PANEL
Anion gap: 9 (ref 5–15)
BUN: 17 mg/dL (ref 8–23)
CO2: 31 mmol/L (ref 22–32)
Calcium: 8.8 mg/dL — ABNORMAL LOW (ref 8.9–10.3)
Chloride: 97 mmol/L — ABNORMAL LOW (ref 98–111)
Creatinine, Ser: 0.89 mg/dL (ref 0.61–1.24)
GFR, Estimated: >60 mL/min (ref >60)
Glucose, Bld: 119 mg/dL — ABNORMAL HIGH (ref 70–99)
Potassium: 4.3 mmol/L (ref 3.5–5.1)
Sodium: 137 mmol/L (ref 135–145)

## 2022-11-13 LAB — PHOSPHORUS: Phosphorus: 2.9 mg/dL (ref 2.5–4.6)

## 2022-11-13 LAB — HEMOGLOBIN A1C
Hgb A1c MFr Bld: 5.8 % — ABNORMAL HIGH (ref 4.8–5.6)
Mean Plasma Glucose: 120 mg/dL

## 2022-11-13 LAB — GLUCOSE, CAPILLARY
Glucose-Capillary: 102 mg/dL — ABNORMAL HIGH (ref 70–99)
Glucose-Capillary: 121 mg/dL — ABNORMAL HIGH (ref 70–99)

## 2022-11-13 LAB — PROCALCITONIN: Procalcitonin: 0.18 ng/mL

## 2022-11-13 MED ORDER — GUAIFENESIN ER 600 MG PO TB12
600.0000 mg | ORAL_TABLET | Freq: Two times a day (BID) | ORAL | 2 refills | Status: DC
  Filled 2022-11-13: qty 20, 10d supply, fill #0

## 2022-11-13 MED ORDER — IPRATROPIUM-ALBUTEROL 0.5-2.5 (3) MG/3ML IN SOLN
3.0000 mL | RESPIRATORY_TRACT | 1 refills | Status: DC | PRN
  Filled 2022-11-13: qty 270, 15d supply, fill #0

## 2022-11-13 MED ORDER — AZITHROMYCIN 500 MG PO TABS
500.0000 mg | ORAL_TABLET | Freq: Every day | ORAL | 0 refills | Status: AC
  Filled 2022-11-13: qty 2, 2d supply, fill #0

## 2022-11-13 MED ORDER — OXYCODONE HCL 5 MG PO TABS
15.0000 mg | ORAL_TABLET | ORAL | Status: DC | PRN
  Administered 2022-11-13 (×3): 15 mg via ORAL
  Filled 2022-11-13 (×3): qty 3

## 2022-11-13 MED ORDER — BREZTRI AEROSPHERE 160-9-4.8 MCG/ACT IN AERO
2.0000 | INHALATION_SPRAY | Freq: Two times a day (BID) | RESPIRATORY_TRACT | 1 refills | Status: DC
  Filled 2022-11-13: qty 10.7, 30d supply, fill #0
  Filled 2022-11-13: qty 60, 30d supply, fill #0

## 2022-11-13 MED ORDER — PREDNISONE 10 MG PO TABS
ORAL_TABLET | ORAL | 0 refills | Status: DC
  Filled 2022-11-13: qty 10, 4d supply, fill #0

## 2022-11-13 MED ORDER — INCRUSE ELLIPTA 62.5 MCG/ACT IN AEPB
1.0000 | INHALATION_SPRAY | Freq: Every day | RESPIRATORY_TRACT | 1 refills | Status: DC
  Filled 2022-11-13: qty 30, 30d supply, fill #0

## 2022-11-13 NOTE — TOC Transition Note (Addendum)
Transition of Care [facility]) - CM/SW Discharge Note   Patient Details  Name: Christopher Pena MRN: [id number] Date of Birth: Mar 03, 1946  [facility] CM/SW Contact:  Levonne Lapping, RN Phone Number: 11/13/2022, 9:11 AM   Clinical Narrative:     Patient  will DC to Home today.   Home Health PT and OT have been recommended and [facility] Lemmings will provide services (Choice per insurance and star rating) .  [employer] will be delivering a nebulizer bedside prior to DC per choice.  All information entered onto AVS.   CM has contacted [employer] (current O2 provider) Patient states [employer] is to go to home to service home concentrator- States it has been over 3 years since [employer] has been out to do this. CM has asked Liaison to check on this  Patient states [employer] is to call his Daughter 1 hour before going to home so she can meet them.  Liaison has confirmed all of the above. Patient cannot dc until home concentrator is serviced and portable has been delivered to room.     Patient has expressed interest in switching O2 providers . Patient will need a new ambulation trial and new O2 orders   Family to transport home.  No DME has been recommended. Patient does have a rolling walker and a BSC at home if needed.             Patient Goals and CMS Choice      Discharge Placement                         Discharge Plan and Services Additional resources added to the After Visit Summary for                                       Social Determinants of Health (SDOH) Interventions SDOH Screenings   Food Insecurity: No Food Insecurity (11/12/2022)  Housing: Low Risk  (11/12/2022)  Transportation Needs: No Transportation Needs (11/12/2022)  Utilities: Not At Risk (11/12/2022)  Alcohol Screen: Low Risk  (02/27/2022)  Depression (PHQ2-9): Low Risk  (07/03/2022)  Financial Resource Strain: Low Risk  (02/27/2022)  Physical Activity: Inactive (02/27/2022)  Social Connections: Socially Isolated  (02/27/2022)  Stress: No Stress Concern Present (02/27/2022)  Tobacco Use: Medium Risk (07/03/2022)     Readmission Risk Interventions     No data to display

## 2022-11-13 NOTE — Progress Notes (Signed)
Physical Therapy Treatment Patient Details Name: Christopher Pena MRN: [id number] DOB: 05-06-1946 Today's Date: 11/13/2022   History of Present Illness 77 y.o. male presents to [facility] hospital on 11/11/2022 with progressive SOB. Pt admitted for COPD exacerbation. PMH includes PAD, COPD, chronic hypoxia on 2.5L [place], HTN, anxiety/depression.    PT Comments    Pt tolerated today's session well but limited by fatigue. Able to mildly increase ambulation distance, reports feeling more stable with his gait, but limited by endurance, no desat noted on 4L O2 [place] for mobility. Encouraged pt to mobilize later today with admission as pt declining further mobility attempts or sitting in chair at this time. Educated pt on having his family around for a few days upon discharge for safety with mobility, pt reports all needed DME and encouraged him to use his RW at discharge as well as utilize [facility] if the bathroom is an increased distance, pt verbalizing understanding. Acute PT will continue to follow to progress mobility, discharge recommendations remain appropriate.     Recommendations for follow up therapy are one component of a multi-disciplinary discharge planning process, led by the attending physician.  Recommendations may be updated based on patient status, additional functional criteria and insurance authorization.  Follow Up Recommendations       Assistance Recommended at Discharge Intermittent Supervision/Assistance  Patient can return home with the following A little help with walking and/or transfers;A little help with bathing/dressing/bathroom;Assistance with cooking/housework;Assist for transportation;Help with stairs or ramp for entrance   Equipment Recommendations  None recommended by PT    Recommendations for Other Services       Precautions / Restrictions Precautions Precautions: Fall Precaution Comments: monitor sats Restrictions Weight Bearing Restrictions: No     Mobility  Bed  Mobility Overal bed mobility: Needs Assistance Bed Mobility: Supine to Sit, Sit to Supine     Supine to sit: Supervision, HOB elevated Sit to supine: Supervision, HOB elevated   General bed mobility comments: performing without use of bedrails but HOB elevated, no difficulty    Transfers Overall transfer level: Needs assistance Equipment used: Rolling walker (2 wheels) Transfers: Sit to/from Stand Sit to Stand: Min guard           General transfer comment: minG for balance, cued for proper hand placement    Ambulation/Gait Ambulation/Gait assistance: Min guard Gait Distance (Feet): 30 Feet Assistive device: Rolling walker (2 wheels) Gait Pattern/deviations: Wide base of support, Step-through pattern, Drifts right/left, Trunk flexed, Decreased stride length Gait velocity: decreased     General Gait Details: balance appears to be improved but minG provided for mild deficits still present, no overt LOB, slow gait. Cued for upright posture and forward gaze   Stairs             Wheelchair Mobility    Modified Rankin (Stroke Patients Only)       Balance Overall balance assessment: Needs assistance Sitting-balance support: No upper extremity supported, Feet supported Sitting balance-Leahy Scale: Fair     Standing balance support: Bilateral upper extremity supported, During functional activity, Reliant on assistive device for balance Standing balance-Leahy Scale: Poor Standing balance comment: reliant on RW for ambulation                            Cognition Arousal/Alertness: Awake/alert Behavior During Therapy: WFL for tasks assessed/performed Overall Cognitive Status: Impaired/Different from baseline Area of Impairment: Safety/judgement  Safety/Judgement: Decreased awareness of safety, Decreased awareness of deficits     General Comments: Pt oriented and pleasant throughout session, focuses on returning home  but needs cueing for safety measures he could take        Exercises      General Comments General comments (skin integrity, edema, etc.): Pt on 2.5L upon arrival, pt and RN reporting use of 4L during mobility. VSS on 4L O2 [place] for mobility, returned to 3L at end of session and maintaining SPO2      Pertinent Vitals/Pain Pain Assessment Pain Assessment: Faces Faces Pain Scale: No hurt    Home Living Family/patient expects to be discharged to:: Private residence Living Arrangements: Alone Available Help at Discharge: Family;Available PRN/intermittently Type of Home: Apartment Home Access: Level entry       Home Layout: One level Home Equipment: Rollator (4 wheels)      Prior Function            PT Goals (current goals can now be found in the care plan section) Acute Rehab PT Goals Patient Stated Goal: to improve activity tolerance and return home PT Goal Formulation: With patient Time For Goal Achievement: 11/26/22 Potential to Achieve Goals: Fair Progress towards PT goals: Progressing toward goals    Frequency    Min 3X/week      PT Plan Current plan remains appropriate    Co-evaluation              AM-PAC PT "6 Clicks" Mobility   Outcome Measure  Help needed turning from your back to your side while in a flat bed without using bedrails?: A Little Help needed moving from lying on your back to sitting on the side of a flat bed without using bedrails?: A Little Help needed moving to and from a bed to a chair (including a wheelchair)?: A Little Help needed standing up from a chair using your arms (e.g., wheelchair or bedside chair)?: A Little Help needed to walk in hospital room?: A Little Help needed climbing 3-5 steps with a railing? : A Lot 6 Click Score: 17    End of Session Equipment Utilized During Treatment: Gait belt;Oxygen Activity Tolerance: Patient limited by fatigue Patient left: in bed;with call bell/phone within reach Nurse  Communication: Mobility status PT Visit Diagnosis: Other abnormalities of gait and mobility (R26.89);Muscle weakness (generalized) (M62.81)     Time: [id number] PT Time Calculation (min) (ACUTE ONLY): 20 min  Charges:  $Gait Training: 8-22 mins                     Charlynne Cousins, PT DPT Acute Rehabilitation Services Office [phone]    Luvenia Heller 11/13/2022, 11:50 AM

## 2022-11-13 NOTE — Evaluation (Signed)
Occupational Therapy Evaluation Patient Details Name: Christopher Pena MRN: [id number] DOB: April 10, 1946 Today's Date: 11/13/2022   History of Present Illness 77 y.o. male presents to [facility] hospital on 11/11/2022 with progressive SOB. Pt admitted for COPD exacerbation. PMH includes PAD, COPD, chronic hypoxia on 2.5L [place], HTN, anxiety/depression.   Clinical Impression   Smokey was evaluated s/p the above admission list. He is mod I with home O2 and use of DME at baseline. Upon evaluation he was limited by SOB, generalized anxiety due to O2 dropping, poor balance and decreased activity tolerance. Overall he requires up to min G for mobility with RW and min G fro ADLs for safety. Pt needed 3L throughout session with SpO2 >90%, several standing rest breaks and cues for PLB needed.  Pt will benefit from continued acute OT services. Pt does not have follow up OT needs.       Recommendations for follow up therapy are one component of a multi-disciplinary discharge planning process, led by the attending physician.  Recommendations may be updated based on patient status, additional functional criteria and insurance authorization.   Assistance Recommended at Discharge Intermittent Supervision/Assistance  Patient can return home with the following A little help with walking and/or transfers;A little help with bathing/dressing/bathroom;Assistance with cooking/housework;Assist for transportation;Help with stairs or ramp for entrance    Functional Status Assessment  Patient has had a recent decline in their functional status and demonstrates the ability to make significant improvements in function in a reasonable and predictable amount of time.  Equipment Recommendations  None recommended by OT       Precautions / Restrictions Precautions Precautions: Fall Precaution Comments: monitor sats Restrictions Weight Bearing Restrictions: No      Mobility Bed Mobility Overal bed mobility: Needs Assistance Bed  Mobility: Supine to Sit     Supine to sit: Supervision          Transfers Overall transfer level: Needs assistance Equipment used: Rolling walker (2 wheels) Transfers: Sit to/from Stand Sit to Stand: Min guard                  Balance Overall balance assessment: Needs assistance Sitting-balance support: No upper extremity supported, Feet supported Sitting balance-Leahy Scale: Fair     Standing balance support: Single extremity supported, During functional activity Standing balance-Leahy Scale: Fair                             ADL either performed or assessed with clinical judgement   ADL Overall ADL's : Needs assistance/impaired Eating/Feeding: Independent   Grooming: Supervision/safety;Standing   Upper Body Bathing: Set up;Sitting   Lower Body Bathing: Min guard;Sit to/from stand   Upper Body Dressing : Set up;Sitting   Lower Body Dressing: Min guard;Sit to/from stand   Toilet Transfer: Min guard;Ambulation;Rolling walker (2 wheels)   Toileting- Clothing Manipulation and Hygiene: Supervision/safety;Sitting/lateral lean       Functional mobility during ADLs: Min guard;Rolling walker (2 wheels) General ADL Comments: cues for safety and PLB, good management of RW.     Vision Baseline Vision/History: 1 Wears glasses Vision Assessment?: No apparent visual deficits     Perception Perception Perception Tested?: No   Praxis Praxis Praxis tested?: Not tested    Pertinent Vitals/Pain Pain Assessment Pain Assessment: Faces Faces Pain Scale: Hurts a little bit Pain Location: chronic back pain Pain Descriptors / Indicators: Discomfort Pain Intervention(s): Limited activity within patient's tolerance, Monitored during session  Hand Dominance Right   Extremity/Trunk Assessment Upper Extremity Assessment Upper Extremity Assessment: Generalized weakness   Lower Extremity Assessment Lower Extremity Assessment: Defer to PT evaluation    Cervical / Trunk Assessment Cervical / Trunk Assessment: Normal   Communication Communication Communication: No difficulties (verbose)   Cognition Arousal/Alertness: Awake/alert Behavior During Therapy: WFL for tasks assessed/performed Overall Cognitive Status: No family/caregiver present to determine baseline cognitive functioning                                 General Comments: Overall WFL for tasks assessed. Pt can be verbose and benefits from cues for PLB during activity     General Comments  VSS on 3L throughout    Exercises     Shoulder Instructions      Home Living Family/patient expects to be discharged to:: Private residence Living Arrangements: Alone Available Help at Discharge: Family;Available PRN/intermittently Type of Home: Apartment Home Access: Level entry     Home Layout: One level     Bathroom Shower/Tub: Teacher, early years/pre: Standard Bathroom Accessibility: Yes   Home Equipment: Rollator (4 wheels)          Prior Functioning/Environment Prior Level of Function : Independent/Modified Independent;Driving             Mobility Comments: ambulates for household distances, utilizing UE support of furniture PRN. Reports he feels utilizing his rollator takes more of his energy ADLs Comments: Typically mod I, states he takes a lot of breaks during and between each task to catch his breath. Family assists with IADLs        OT Problem List: Decreased strength;Decreased range of motion;Impaired balance (sitting and/or standing);Decreased activity tolerance;Decreased safety awareness      OT Treatment/Interventions: Self-care/ADL training;Therapeutic exercise;Energy conservation;DME and/or AE instruction;Therapeutic activities;Patient/family education;Balance training    OT Goals(Current goals can be found in the care plan section) Acute Rehab OT Goals Patient Stated Goal: home OT Goal Formulation: With patient Time  For Goal Achievement: 11/27/22 Potential to Achieve Goals: Good ADL Goals Additional ADL Goal #1: Pt will complete BADLs with mod I Additional ADL Goal #2: Pt will indep recall at least 3 energy conservation strategies to apply to the home setting  OT Frequency: Min 2X/week    Co-evaluation              AM-PAC OT "6 Clicks" Daily Activity     Outcome Measure Help from another person eating meals?: None Help from another person taking care of personal grooming?: A Little Help from another person toileting, which includes using toliet, bedpan, or urinal?: A Little Help from another person bathing (including washing, rinsing, drying)?: A Little Help from another person to put on and taking off regular upper body clothing?: None Help from another person to put on and taking off regular lower body clothing?: A Little 6 Click Score: 20   End of Session Equipment Utilized During Treatment: Rolling walker (2 wheels);Oxygen Nurse Communication: Mobility status (O2 sat)  Activity Tolerance: Patient tolerated treatment well Patient left: in bed  OT Visit Diagnosis: Unsteadiness on feet (R26.81);Other abnormalities of gait and mobility (R26.89);Muscle weakness (generalized) (M62.81)                Time: 1040-1104 OT Time Calculation (min): 24 min Charges:  OT General Charges $OT Visit: 1 Visit OT Evaluation $OT Eval Moderate Complexity: 1 Mod OT Treatments $Therapeutic Activity: 8-22 mins  Shade Flood, OTR/L Acute Rehabilitation Services Office [phone] Secure Chat Communication Preferred   Elliot Cousin 11/13/2022, 11:21 AM

## 2022-11-13 NOTE — Telephone Encounter (Signed)
error 

## 2022-11-13 NOTE — Plan of Care (Signed)

## 2022-11-13 NOTE — Progress Notes (Signed)
TRH night cross cover note:   I was notified by RN of the patient's request for resumption of his outpatient prn short acting oxycodone for chronic back pain.  No evidence of altered mental status reported, and renal function appears to be at baseline.  I subsequently resumed his outpatient prn oxycodone for his chronic back pain.     Babs Bertin, DO Hospitalist

## 2022-11-13 NOTE — Discharge Summary (Addendum)
PATIENT DETAILS Name: Christopher Pena Age: 77 y.o. Sex: male Date of Birth: 1946/02/04 MRN: [id number]. Admitting Physician: Florencia Reasons, MD CR:[id number], Aundra Millet, MD  Admit Date: 11/11/2022 Discharge date: 11/13/2022  Recommendations for Outpatient Follow-up:  Follow up with PCP in 1-2 weeks Please obtain CMP/CBC in one week See below for incidental findings-and recommendations-lung nodule/infrarenal AAA.  Admitted From:  Home  Disposition: Home   Discharge Condition: fair  CODE STATUS:   Code Status: Full Code   Diet recommendation:  Diet Order             Diet - low sodium heart healthy           Diet heart healthy/carb modified Room service appropriate? Yes; Fluid consistency: Thin  Diet effective now                    Brief Summary: 77 year old with history of COPD on home O2-2-3 L-presented with shortness of breath-found to have COPD exacerbation.  Brief Hospital Course: Acute on chronic hypoxic respiratory failure secondary to COPD exacerbation Significantly better after treatment with steroids/bronchodilators.  This morning he feels that he is back to his baseline-down to just 2-3 L of oxygen.  He does not feel as tight as he was when he first came in-he is moving air well without any rhonchi.  Plan is to discharge him home on tapering steroids-he is not on LABA/LAMA/inhaled steroids combination at home-we will plan on discharging him home with inhaled steroid/LABA/LAMA combination.  He will continue to use albuterol inhaler for rescue-he requests that we get him a nebulizer for a second rescue when albuterol does not work.  He will need to follow-up with his primary care practitioner, note-he used to follow-up with Dr. Melvyn Novas but has not had follow-up for the past couple of years.  I have encouraged him to make an appointment with pulmonology.  PAD Stable Continue aspirin/Plavix/Crestor  Chronic pain syndrome Continue usual narcotic regimen  Mood  disorder Stable Continue Zoloft/BuSpar.  4.2 cm infrarenal abdominal aortic aneurysm Incidental finding-radiology recommending repeat imaging in 12 months, and outpatient vascular surgery consultation.  This will be deferred to patient's primary care practitioner  6 mm solid pulmonary nodule right upper lobe Prior smoker-needs repeat noncontrast CT chest at 6 to 12 months followed by another noncontrast CT chest 18-24 months.  This is be deferred to patient's primary care practitioner.  1 cm right lower lobe lung nodule: This is stable-unchanged from previous multiple scans and felt to be benign in etiology.   Discharge Diagnoses:  Principal Problem:   COPD with acute exacerbation ([place]) Active Problems:   COPD (chronic obstructive pulmonary disease) ([place])   Discharge Instructions:  Activity:  As tolerated with Full fall precautions use walker/cane & assistance as needed  Discharge Instructions     Call MD for:  difficulty breathing, headache or visual disturbances   Complete by: As directed    Diet - low sodium heart healthy   Complete by: As directed    Discharge instructions   Complete by: As directed    Follow with Primary MD  Christopher Minium, MD in 1-2 weeks  You have a abdominal aortic aneurysm-you will need a repeat CT scan of your abdomen and 6 to 12 months, please ask your primary care practitioner for a referral to vascular surgery.  You were found to have a 6 mm right upper lung nodule-in some cases it can turn cancerous-you will need a repeat CT chest  in 6 to 12 months.  Please talk to your primary care practitioner about this finding.  Please get a complete blood count and chemistry panel checked by your Primary MD at your next visit, and again as instructed by your Primary MD.  Get Medicines reviewed and adjusted: Please take all your medications with you for your next visit with your Primary MD  Laboratory/radiological data: Please request your Primary  MD to go over all hospital tests and procedure/radiological results at the follow up, please ask your Primary MD to get all Hospital records sent to his/her office.  In some cases, they will be blood work, cultures and biopsy results pending at the time of your discharge. Please request that your primary care M.D. follows up on these results.  Also Note the following: If you experience worsening of your admission symptoms, develop shortness of breath, life threatening emergency, suicidal or homicidal thoughts you must seek medical attention immediately by calling 911 or calling your MD immediately  if symptoms less severe.  You must read complete instructions/literature along with all the possible adverse reactions/side effects for all the Medicines you take and that have been prescribed to you. Take any new Medicines after you have completely understood and accpet all the possible adverse reactions/side effects.   Do not drive when taking Pain medications or sleeping medications (Benzodaizepines)  Do not take more than prescribed Pain, Sleep and Anxiety Medications. It is not advisable to combine anxiety,sleep and pain medications without talking with your primary care practitioner  Special Instructions: If you have smoked or chewed Tobacco  in the last 2 yrs please stop smoking, stop any regular Alcohol  and or any Recreational drug use.  Wear Seat belts while driving.  Please note: You were cared for by a hospitalist during your hospital stay. Once you are discharged, your primary care physician will handle any further medical issues. Please note that NO REFILLS for any discharge medications will be authorized once you are discharged, as it is imperative that you return to your primary care physician (or establish a relationship with a primary care physician if you do not have one) for your post hospital discharge needs so that they can reassess your need for medications and monitor your lab  values.   For home use only DME Nebulizer machine   Complete by: As directed    Patient needs a nebulizer to treat with the following condition: COPD (chronic obstructive pulmonary disease) ([place])   Length of Need: Lifetime   Increase activity slowly   Complete by: As directed       Allergies as of 11/13/2022       Reactions   Clindamycin/lincomycin Diarrhea        Medication List     TAKE these medications    acetaminophen 500 MG tablet Commonly known as: TYLENOL Take 500 mg by mouth every 6 (six) hours as needed for headache.   albuterol 108 (90 Base) MCG/ACT inhaler Commonly known as: VENTOLIN HFA INHALE TWO PUFFS BY MOUTH INTO LUNGS every SIX hours AS NEEDED FOR WHEEZING AND/OR SHORTNESS OF BREATH   aspirin EC 81 MG tablet Take 1 tablet (81 mg total) by mouth daily at 6 (six) AM. Swallow whole.   azithromycin 500 MG tablet Commonly known as: Zithromax Take 1 tablet (500 mg total) by mouth daily for 2 days.   Breztri Aerosphere 160-9-4.8 MCG/ACT Aero Generic drug: Budeson-Glycopyrrol-Formoterol Inhale 2 puffs into the lungs 2 (two) times daily.   busPIRone 7.5  MG tablet Commonly known as: BUSPAR Take 1 tablet (7.5 mg total) by mouth 2 (two) times daily.   clopidogrel 75 MG tablet Commonly known as: PLAVIX TAKE ONE TABLET BY MOUTH ONCE DAILY   Fluad Quadrivalent 0.5 ML injection Generic drug: influenza vaccine adjuvanted   ipratropium-albuterol 0.5-2.5 (3) MG/3ML Soln Commonly known as: DUONEB Inhale 3 mLs by nebulization every 4 (four) hours as needed (shortness of breath). What changed: See the new instructions.   Mucus Relief 600 MG 12 hr tablet Generic drug: guaiFENesin Take 1 tablet (600 mg total) by mouth 2 (two) times daily.   naloxone 4 MG/0.1ML Liqd nasal spray kit Commonly known as: NARCAN SMARTSIG:In Nostril   oxyCODONE 15 MG immediate release tablet Commonly known as: ROXICODONE Take 1 tablet (15 mg total) by mouth every 6 (six) hours as  needed for pain.   predniSONE 10 MG tablet Commonly known as: DELTASONE Take 4 tablets (40 mg) daily for 1 day, 3 tablets (30 mg) daily for 1 day, 2 tablets (20 mg) daily for 1 days, 1 tablet (10 mg daily) for 1 day, then stop What changed: additional instructions   rosuvastatin 20 MG tablet Commonly known as: CRESTOR TAKE ONE TABLET BY MOUTH ONCE DAILY   sertraline 25 MG tablet Commonly known as: ZOLOFT TAKE ONE TABLET BY MOUTH ONCE DAILY   Spikevax syringe Generic drug: COVID-19 mRNA vaccine 2023-2024               Durable Medical Equipment  (From admission, onward)           Start     Ordered   11/13/22 0000  For home use only DME Nebulizer machine       Question Answer Comment  Patient needs a nebulizer to treat with the following condition COPD (chronic obstructive pulmonary disease) ([place])   Length of Need Lifetime      11/13/22 0846            Follow-up Information     Christopher Minium, MD. Schedule an appointment as soon as possible for a visit in 1 week(s).   Specialty: Family Medicine Contact information: [address] [place] [address] [phone]         Tanda Rockers, MD. Schedule an appointment as soon as possible for a visit in 2 week(s).   Specialty: Pulmonary Disease Contact information: [place] [address] [phone]         [facility] Follow up.   Specialty: Home Health Services Why: [facility] will provide Home Health PT and OT and will contact you to schedule initial home visit Contact information: [address] [place]  [address] [phone]         [facility] Follow up.   Why: [facility] is providing the nebulizer Contact information: [place] [id number] [place], [place]    [phone]               Allergies  Allergen Reactions   Clindamycin/Lincomycin Diarrhea     Other Procedures/Studies: CT Angio Chest PE W/Cm &/Or  Wo Cm  Result Date: 11/12/2022 CLINICAL DATA:  Shortness of breath for several hours EXAM: CT ANGIOGRAPHY CHEST WITH CONTRAST TECHNIQUE: Multidetector CT imaging of the chest was performed using the standard protocol during bolus administration of intravenous contrast. Multiplanar CT image reconstructions and MIPs were obtained to evaluate the vascular anatomy. RADIATION DOSE REDUCTION: This exam was performed according to  the departmental dose-optimization program which includes automated exposure control, adjustment of the mA and/or kV according to patient size and/or use of iterative reconstruction technique. CONTRAST:  32mL OMNIPAQUE IOHEXOL 350 MG/ML SOLN COMPARISON:  07/08/2021 FINDINGS: Cardiovascular: Atherosclerotic calcifications of the thoracic aorta are noted. No aneurysmal dilatation is seen. The degree of opacification is limited precluding adequate evaluation for dissection. The pulmonary artery shows a normal branching pattern bilaterally. No filling defects are identified to suggest pulmonary embolism. Mild coronary calcifications are seen. Mediastinum/Nodes: Thoracic inlet is within normal limits. No hilar or mediastinal adenopathy is noted. The esophagus as visualized is within normal limits. Lungs/Pleura: Lungs are well aerated bilaterally. Mild emphysematous changes are seen. Apical scarring is again noted particularly on the right stable from the prior exam. Nodular density is noted along the major fissure superiorly measuring 6 mm. This is best visualized on image number 46 of series 6 stable subpleural nodule in the right lower lobe is noted measuring 10 mm best seen on image number 115 of series 6. This is stable from prior exams and felt to be benign in etiology. Mucous filled bronchi are noted in the lower lobes bilaterally right greater than left. Upper Abdomen: Visualized upper abdomen demonstrates aneurysmal dilatation of the infrarenal aorta with considerable mural thrombus. This  measures up to 4.2 cm in greatest dimension. This is slightly larger than that seen on a prior exam from 12/06/2020. Musculoskeletal: No acute rib abnormality is noted. Degenerative changes of the thoracic spine are noted. Review of the MIP images confirms the above findings. IMPRESSION: No evidence of pulmonary emboli. 4.2 cm infrarenal abdominal aortic aneurysm. Recommend follow-up every 12 months and vascular consultation. Reference: J Am Coll Radiol [id number]. 6 mm solid pulmonary nodule in the right upper lobe. Per Fleischner Society Guidelines, recommend a non-contrast Chest CT at 6-12 months, then another non-contrast Chest CT at 18-24 months. These guidelines do not apply to immunocompromised patients and patients with cancer. Follow up in patients with significant comorbidities as clinically warranted. For lung cancer screening, adhere to Lung-RADS guidelines. Reference: Radiology. 2017; 284(1):228-43. Stable 1 cm right lower lobe nodule. This is unchanged over multiple previous exams and felt to be benign in etiology. Multiple mucous filled bronchi in the lower lobes bilaterally worse on the right than the left. Electronically Signed   By: Inez Catalina M.D.   On: 11/12/2022 00:45   DG Chest Port 1 View  Result Date: 11/11/2022 CLINICAL DATA:  Shortness of breath since this afternoon. EXAM: PORTABLE CHEST 1 VIEW COMPARISON:  08/08/2020 FINDINGS: Heart size and pulmonary vascularity are normal. Emphysematous changes in the lungs. Diffuse interstitial fibrosis and peribronchial thickening suggesting chronic bronchitis. Bilateral apical pleural thickening, scarring, and calcification, likely postinflammatory. No developing airspace disease or consolidation. Nodular opacity in the right lower lung likely represents a prominent nipple shadow. No pleural effusions. No pneumothorax. Calcification of the aorta. IMPRESSION: Emphysematous changes, fibrosis, and chronic bronchitic changes in the lungs.  Bilateral apical pleural thickening and calcification is unchanged and likely postinflammatory. No developing consolidation or edema. Electronically Signed   By: Lucienne Capers M.D.   On: 11/11/2022 22:58     TODAY-DAY OF DISCHARGE:  Subjective:   Jamison Neighbor today has no headache,no chest abdominal pain,no new weakness tingling or numbness, feels much better wants to go home today.   Objective:   Blood pressure 91/73, pulse 70, temperature 98 F (36.7 C), temperature source Oral, resp. rate 18, height 5\' 7"  (1.702 m), weight 65.8 kg, SpO2  96 %.  Intake/Output Summary (Last 24 hours) at 11/13/2022 1319 Last data filed at 11/13/2022 0800 Gross per 24 hour  Intake --  Output 800 ml  Net -800 ml   Filed Weights   11/11/22 2246  Weight: 65.8 kg    Exam: Awake Alert, Oriented *3, No new F.N deficits, Normal affect [place].AT,PERRAL Supple Neck,No JVD, No cervical lymphadenopathy appriciated.  Symmetrical Chest wall movement, Good air movement bilaterally, CTAB RRR,No Gallops,Rubs or new Murmurs, No Parasternal Heave +ve B.Sounds, Abd Soft, Non tender, No organomegaly appriciated, No rebound -guarding or rigidity. No Cyanosis, Clubbing or edema, No new Rash or bruise   PERTINENT RADIOLOGIC STUDIES: CT Angio Chest PE W/Cm &/Or Wo Cm  Result Date: 11/12/2022 CLINICAL DATA:  Shortness of breath for several hours EXAM: CT ANGIOGRAPHY CHEST WITH CONTRAST TECHNIQUE: Multidetector CT imaging of the chest was performed using the standard protocol during bolus administration of intravenous contrast. Multiplanar CT image reconstructions and MIPs were obtained to evaluate the vascular anatomy. RADIATION DOSE REDUCTION: This exam was performed according to the departmental dose-optimization program which includes automated exposure control, adjustment of the mA and/or kV according to patient size and/or use of iterative reconstruction technique. CONTRAST:  5mL OMNIPAQUE IOHEXOL 350 MG/ML SOLN  COMPARISON:  07/08/2021 FINDINGS: Cardiovascular: Atherosclerotic calcifications of the thoracic aorta are noted. No aneurysmal dilatation is seen. The degree of opacification is limited precluding adequate evaluation for dissection. The pulmonary artery shows a normal branching pattern bilaterally. No filling defects are identified to suggest pulmonary embolism. Mild coronary calcifications are seen. Mediastinum/Nodes: Thoracic inlet is within normal limits. No hilar or mediastinal adenopathy is noted. The esophagus as visualized is within normal limits. Lungs/Pleura: Lungs are well aerated bilaterally. Mild emphysematous changes are seen. Apical scarring is again noted particularly on the right stable from the prior exam. Nodular density is noted along the major fissure superiorly measuring 6 mm. This is best visualized on image number 46 of series 6 stable subpleural nodule in the right lower lobe is noted measuring 10 mm best seen on image number 115 of series 6. This is stable from prior exams and felt to be benign in etiology. Mucous filled bronchi are noted in the lower lobes bilaterally right greater than left. Upper Abdomen: Visualized upper abdomen demonstrates aneurysmal dilatation of the infrarenal aorta with considerable mural thrombus. This measures up to 4.2 cm in greatest dimension. This is slightly larger than that seen on a prior exam from 12/06/2020. Musculoskeletal: No acute rib abnormality is noted. Degenerative changes of the thoracic spine are noted. Review of the MIP images confirms the above findings. IMPRESSION: No evidence of pulmonary emboli. 4.2 cm infrarenal abdominal aortic aneurysm. Recommend follow-up every 12 months and vascular consultation. Reference: J Am Coll Radiol [id number]. 6 mm solid pulmonary nodule in the right upper lobe. Per Fleischner Society Guidelines, recommend a non-contrast Chest CT at 6-12 months, then another non-contrast Chest CT at 18-24 months. These  guidelines do not apply to immunocompromised patients and patients with cancer. Follow up in patients with significant comorbidities as clinically warranted. For lung cancer screening, adhere to Lung-RADS guidelines. Reference: Radiology. 2017; 284(1):228-43. Stable 1 cm right lower lobe nodule. This is unchanged over multiple previous exams and felt to be benign in etiology. Multiple mucous filled bronchi in the lower lobes bilaterally worse on the right than the left. Electronically Signed   By: Inez Catalina M.D.   On: 11/12/2022 00:45   DG Chest Port 1 View  Result Date:  11/11/2022 CLINICAL DATA:  Shortness of breath since this afternoon. EXAM: PORTABLE CHEST 1 VIEW COMPARISON:  08/08/2020 FINDINGS: Heart size and pulmonary vascularity are normal. Emphysematous changes in the lungs. Diffuse interstitial fibrosis and peribronchial thickening suggesting chronic bronchitis. Bilateral apical pleural thickening, scarring, and calcification, likely postinflammatory. No developing airspace disease or consolidation. Nodular opacity in the right lower lung likely represents a prominent nipple shadow. No pleural effusions. No pneumothorax. Calcification of the aorta. IMPRESSION: Emphysematous changes, fibrosis, and chronic bronchitic changes in the lungs. Bilateral apical pleural thickening and calcification is unchanged and likely postinflammatory. No developing consolidation or edema. Electronically Signed   By: Lucienne Capers M.D.   On: 11/11/2022 22:58     PERTINENT LAB RESULTS: CBC: Recent Labs    11/11/22 2242 11/12/22 0303  WBC 10.8* 9.0  HGB 12.2* 12.5*  HCT 39.2 39.1  PLT 273 262   CMET CMP     Component Value Date/Time   NA 137 11/13/2022 0338   K 4.3 11/13/2022 0338   CL 97 (L) 11/13/2022 0338   CO2 31 11/13/2022 0338   GLUCOSE 119 (H) 11/13/2022 0338   BUN 17 11/13/2022 0338   CREATININE 0.89 11/13/2022 0338   CREATININE 0.69 05/11/2014 1533   CALCIUM 8.8 (L) 11/13/2022 0338    PROT 7.5 11/12/2022 0303   ALBUMIN 3.4 (L) 11/12/2022 0303   AST 30 11/12/2022 0303   ALT 13 11/12/2022 0303   ALKPHOS 66 11/12/2022 0303   BILITOT 0.7 11/12/2022 0303   GFRNONAA >60 11/13/2022 0338   GFRNONAA >89 04/19/2014 1418   GFRAA >60 12/24/2018 0224   GFRAA >89 04/19/2014 1418    GFR Estimated Creatinine Clearance: 65.7 mL/min (by C-G formula based on SCr of 0.89 mg/dL). No results for input(s): "LIPASE", "AMYLASE" in the last 72 hours. No results for input(s): "CKTOTAL", "CKMB", "CKMBINDEX", "TROPONINI" in the last 72 hours. Invalid input(s): "POCBNP" No results for input(s): "DDIMER" in the last 72 hours. Recent Labs    11/12/22 0303  HGBA1C 5.8*   No results for input(s): "CHOL", "HDL", "LDLCALC", "TRIG", "CHOLHDL", "LDLDIRECT" in the last 72 hours. No results for input(s): "TSH", "T4TOTAL", "T3FREE", "THYROIDAB" in the last 72 hours.  Invalid input(s): "FREET3" No results for input(s): "VITAMINB12", "FOLATE", "FERRITIN", "TIBC", "IRON", "RETICCTPCT" in the last 72 hours. Coags: No results for input(s): "INR" in the last 72 hours.  Invalid input(s): "PT" Microbiology: Recent Results (from the past 240 hour(s))  Resp panel by RT-PCR (RSV, Flu A&B, Covid) Anterior Nasal Swab     Status: None   Collection Time: 11/11/22 10:37 PM   Specimen: Anterior Nasal Swab  Result Value Ref Range Status   SARS Coronavirus 2 by RT PCR NEGATIVE NEGATIVE Final   Influenza A by PCR NEGATIVE NEGATIVE Final   Influenza B by PCR NEGATIVE NEGATIVE Final    Comment: (NOTE) The Xpert Xpress SARS-CoV-2/FLU/RSV plus assay is intended as an aid in the diagnosis of influenza from Nasopharyngeal swab specimens and should not be used as a sole basis for treatment. Nasal washings and aspirates are unacceptable for Xpert Xpress SARS-CoV-2/FLU/RSV testing.  Fact Sheet for Patients: EntrepreneurPulse.com.au  Fact Sheet for Healthcare  Providers: IncredibleEmployment.be  This test is not yet approved or cleared by the [place] FDA and has been authorized for detection and/or diagnosis of SARS-CoV-2 by FDA under an Emergency Use Authorization (EUA). This EUA will remain in effect (meaning this test can be used) for the duration of the COVID-19 declaration under Section 564(b)(1) of  the Act, 21 U.S.C. section 360bbb-3(b)(1), unless the authorization is terminated or revoked.     Resp Syncytial Virus by PCR NEGATIVE NEGATIVE Final    Comment: (NOTE) Fact Sheet for Patients: EntrepreneurPulse.com.au  Fact Sheet for Healthcare Providers: IncredibleEmployment.be  This test is not yet approved or cleared by the [place] FDA and has been authorized for detection and/or diagnosis of SARS-CoV-2 by FDA under an Emergency Use Authorization (EUA). This EUA will remain in effect (meaning this test can be used) for the duration of the COVID-19 declaration under Section 564(b)(1) of the Act, 21 U.S.C. section 360bbb-3(b)(1), unless the authorization is terminated or revoked.  Performed at [facility], [place] [address] [place], [place] [address]     FURTHER DISCHARGE INSTRUCTIONS:  Get Medicines reviewed and adjusted: Please take all your medications with you for your next visit with your Primary MD  Laboratory/radiological data: Please request your Primary MD to go over all hospital tests and procedure/radiological results at the follow up, please ask your Primary MD to get all Hospital records sent to his/her office.  In some cases, they will be blood work, cultures and biopsy results pending at the time of your discharge. Please request that your primary care M.D. goes through all the records of your hospital data and follows up on these results.  Also Note the following: If you experience worsening of your admission symptoms, develop shortness of  breath, life threatening emergency, suicidal or homicidal thoughts you must seek medical attention immediately by calling 911 or calling your MD immediately  if symptoms less severe.  You must read complete instructions/literature along with all the possible adverse reactions/side effects for all the Medicines you take and that have been prescribed to you. Take any new Medicines after you have completely understood and accpet all the possible adverse reactions/side effects.   Do not drive when taking Pain medications or sleeping medications (Benzodaizepines)  Do not take more than prescribed Pain, Sleep and Anxiety Medications. It is not advisable to combine anxiety,sleep and pain medications without talking with your primary care practitioner  Special Instructions: If you have smoked or chewed Tobacco  in the last 2 yrs please stop smoking, stop any regular Alcohol  and or any Recreational drug use.  Wear Seat belts while driving.  Please note: You were cared for by a hospitalist during your hospital stay. Once you are discharged, your primary care physician will handle any further medical issues. Please note that NO REFILLS for any discharge medications will be authorized once you are discharged, as it is imperative that you return to your primary care physician (or establish a relationship with a primary care physician if you do not have one) for your post hospital discharge needs so that they can reassess your need for medications and monitor your lab values.  Total Time spent coordinating discharge including counseling, education and face to face time equals greater than 30 minutes.  Signed: Bon Dowis 11/13/2022 1:19 PM

## 2022-11-14 ENCOUNTER — Ambulatory Visit: Payer: Self-pay

## 2022-11-14 ENCOUNTER — Other Ambulatory Visit (HOSPITAL_COMMUNITY): Payer: Self-pay

## 2022-11-14 ENCOUNTER — Telehealth: Payer: Self-pay

## 2022-11-14 NOTE — Transitions of Care (Post Inpatient/ED Visit) (Signed)
   11/14/2022  Name: Christopher Pena MRN: [id number] DOB: 03-16-1946  Today's TOC FU Call Status: Today's TOC FU Call Status:: Unsuccessul Call (1st Attempt) Unsuccessful Call (1st Attempt) Date: 11/14/22  Attempted to reach the patient regarding the most recent Inpatient/ED visit.  Follow Up Plan: Additional outreach attempts will be made to reach the patient to complete the Transitions of Care (Post Inpatient/ED visit) call.      Enzo Montgomery, RN,BSN,CCM [facility] Care Management Care Management Community Coordinator Direct Phone: [phone] Toll Free: [phone] Fax: [phone]

## 2022-11-14 NOTE — Chronic Care Management (AMB) (Signed)
   11/14/2022  Christopher Pena [place] 1946/04/01 [id number]   Reason for Encounter: Patient is not currently enrolled in the CCM program. CCM status changed to previously enrolled.   Horris Latino RN Care Manager/Chronic Care Management [phone]

## 2022-11-14 NOTE — Transitions of Care (Post Inpatient/ED Visit) (Signed)
11/14/2022  Name: Christopher Pena MRN: [id number] DOB: 05-24-1946  Today's TOC FU Call Status: Today's TOC FU Call Status:: Successful TOC FU Call Competed TOC FU Call Complete Date: 11/14/22 (Incoming call from patient returning RN CM call)  Transition Care Management Follow-up Telephone Call Date of Discharge: 11/13/22 Discharge Facility: [facility]) Type of Discharge: Inpatient Admission Primary Inpatient Discharge Diagnosis:: "COPD exacerbation" How have you been since you were released from the hospital?: Better (Patient reports he is using oxygen at 2-2.5L/min cont. His sats have been 92-96% at rest. He voices his sats drop some to 88-90% with exertion/movement but easily resolved with rest and deep breathing. He is using nebs as ordered.) Any questions or concerns?: No  Items Reviewed: Did you receive and understand the discharge instructions provided?: No Medications obtained and verified?: Yes (Medications Reviewed) Any new allergies since your discharge?: No Dietary orders reviewed?: Yes Type of Diet Ordered:: heart healthy Do you have support at home?: Yes People in Home: alone Name of Support/Comfort Primary Source: son in law helping him out as needed  [place] and Equipment/Supplies: [place] Ordered?: Yes Name of [place]:: [place] set up a time to come to your home?: Yes [place] Visit Date: 11/15/22 (Patient states [place] wanted to visit today but he declined-wanted to rest-they will be out tomorrow to see him instead) Any new equipment or medical supplies ordered?: Yes Name of Medical supply agency?: [employer] Were you able to get the equipment/medical supplies?: Yes Do you have any questions related to the use of the equipment/supplies?: No  Functional Questionnaire: Do you need assistance with bathing/showering or dressing?: No Do you need assistance with meal preparation?: No Do you need assistance  with eating?: No Do you have difficulty maintaining continence: No Do you need assistance with getting out of bed/getting out of a chair/moving?: No Do you have difficulty managing or taking your medications?: No  Follow up appointments reviewed: PCP Follow-up appointment confirmed?: Yes Date of PCP follow-up appointment?: 11/20/22 Follow-up Provider: Dr. Birdie Riddle Specialist [facility] Follow-up appointment confirmed?: No Reason Specialist Follow-Up Not Confirmed: Patient has Specialist Provider Number and will Call for Appointment Do you need transportation to your follow-up appointment?: No Do you understand care options if your condition(s) worsen?: Yes-patient verbalized understanding  SDOH Interventions Today    Flowsheet Row Most Recent Value  SDOH Interventions   Food Insecurity Interventions Intervention Not Indicated  Transportation Interventions Intervention Not Indicated      TOC Interventions Today    Flowsheet Row Most Recent Value  TOC Interventions   TOC Interventions Discussed/Reviewed TOC Interventions Discussed, Post discharge activity limitations per provider, Arranged PCP follow up within 7 days/Care Guide scheduled      Interventions Today    Flowsheet Row Most Recent Value  Chronic Disease   Chronic disease during today's visit Chronic Obstructive Pulmonary Disease (COPD)  General Interventions   General Interventions Discussed/Reviewed General Interventions Discussed, Doctor Visits  [oxygen therapy]  Doctor Visits Discussed/Reviewed PCP, Specialist, Doctor Visits Discussed  [pt wishes to find another pulmonologist-provided with contact info on obtaining another provider]  PCP/Specialist Visits Compliance with follow-up visit  Education Interventions   Education Provided Provided Education  Provided Verbal Education On Nutrition, When to see the doctor, Medication  Nutrition Interventions   Nutrition Discussed/Reviewed Nutrition Discussed  Pharmacy  Interventions   Pharmacy Dicussed/Reviewed Pharmacy Topics Discussed, Medications and their functions  Safety Interventions   Safety Discussed/Reviewed Safety Discussed,  Home Safety  [facility] services orderd-home visit scheduled for tomorrow]       Hetty Blend [facility] Care Management Care Management Community Coordinator Direct Phone: [phone] Toll Free: [phone] Fax: [phone]

## 2022-11-15 DIAGNOSIS — I10 Essential (primary) hypertension: Secondary | ICD-10-CM | POA: Diagnosis not present

## 2022-11-18 ENCOUNTER — Telehealth: Payer: Self-pay

## 2022-11-18 DIAGNOSIS — I10 Essential (primary) hypertension: Secondary | ICD-10-CM | POA: Diagnosis not present

## 2022-11-18 DIAGNOSIS — J441 Chronic obstructive pulmonary disease with (acute) exacerbation: Secondary | ICD-10-CM | POA: Diagnosis not present

## 2022-11-18 DIAGNOSIS — J439 Emphysema, unspecified: Secondary | ICD-10-CM | POA: Diagnosis not present

## 2022-11-18 DIAGNOSIS — I739 Peripheral vascular disease, unspecified: Secondary | ICD-10-CM | POA: Diagnosis not present

## 2022-11-18 DIAGNOSIS — J9621 Acute and chronic respiratory failure with hypoxia: Secondary | ICD-10-CM | POA: Diagnosis not present

## 2022-11-18 NOTE — Telephone Encounter (Signed)
Correct.  Prednisone is not to be taken regularly.  Due to his increased issues w/ COPD- and recent admission- needs to schedule an appt w/ pulmonary

## 2022-11-18 NOTE — Patient Outreach (Signed)
  Care Coordination   Follow Up Visit Note   11/18/2022 Name: Christopher Pena MRN: [id number] DOB: 1945/10/10  Voicemail message received from patient. Return call placed to patienr. He states that he would like to have a bigger oxygen concentrator that goes up higher and wanted to know if MD that saw him in hospital can order it. He contacted oxygen supplier and was told he would need a script from MD. His previous oxygen orders were for 2-2.5L/min. Patient states he has been using about 4L. Advised patient that once he is discharged from hospital-orders must come from his providers.He called pulmonologist office to get order and since patient has not been seen in several yrs he has to be evaluated by MD first. Earliest appt available was 12/17/22. Patient has upcoming PCP appt and will discuss with provider during that appt about having oxygen orders changed and sent to DME supplier. He confirms he has enough oxygen/concentrator in the home at present.      Care Coordination Interventions:  Yes, provided   Follow up plan: No further intervention required.   Encounter Outcome:  Pt. Visit Completed   Hetty Blend [facility] Care Management Care Management Community Coordinator Direct Phone: [phone] Toll Free: [phone] Fax: [phone]

## 2022-11-19 ENCOUNTER — Telehealth: Payer: Self-pay | Admitting: Family Medicine

## 2022-11-19 ENCOUNTER — Telehealth: Payer: Self-pay | Admitting: Pharmacist

## 2022-11-19 NOTE — Telephone Encounter (Signed)
Received "for review" home health forms from [facility] no charge sheet attached please advise next steps if needed.

## 2022-11-19 NOTE — Progress Notes (Signed)
Care Management & Coordination Services Pharmacy Team  Reason for Encounter: Appointment Reminder  Contacted patient to confirm telephone appointment with Leata Mouse, PharmD on 11/20/22 at [place] with patient on 11/19/2022   Do you have any problems getting your medications? No If yes what types of problems are you experiencing?  NA  What is your top health concern you would like to discuss at your upcoming visit?  Hospital follow up with PCP 11/20/22 11AM  Have you seen any other providers since your last visit with PCP? Yes hospitalization 11/11/22 for COPD exacerbation.    Future Appointments  Date Time Provider [place]  11/20/2022 11:00 AM [name], MD [facility]  11/20/2022  1:00 PM Edythe Clarity, [place] None  12/17/2022  1:30 PM Melvyn Novas Christena Deem, MD [place], [facility]

## 2022-11-19 NOTE — Telephone Encounter (Signed)
Spoke to the pt and I advised him that Prednisone is not to be taken daily . He has an apt w/ Pulmonologist April 30

## 2022-11-20 ENCOUNTER — Encounter: Payer: Self-pay | Admitting: Family Medicine

## 2022-11-20 ENCOUNTER — Ambulatory Visit (INDEPENDENT_AMBULATORY_CARE_PROVIDER_SITE_OTHER): Payer: Medicare Other | Admitting: Family Medicine

## 2022-11-20 ENCOUNTER — Encounter: Payer: Medicare Other | Admitting: Pharmacist

## 2022-11-20 ENCOUNTER — Ambulatory Visit: Payer: Medicare Other | Admitting: Pharmacist

## 2022-11-20 VITALS — BP 130/70 | HR 124 | Temp 97.8°F | Resp 20

## 2022-11-20 DIAGNOSIS — R911 Solitary pulmonary nodule: Secondary | ICD-10-CM | POA: Diagnosis not present

## 2022-11-20 DIAGNOSIS — I7143 Infrarenal abdominal aortic aneurysm, without rupture: Secondary | ICD-10-CM | POA: Diagnosis not present

## 2022-11-20 DIAGNOSIS — J9621 Acute and chronic respiratory failure with hypoxia: Secondary | ICD-10-CM

## 2022-11-20 DIAGNOSIS — J9600 Acute respiratory failure, unspecified whether with hypoxia or hypercapnia: Secondary | ICD-10-CM | POA: Diagnosis not present

## 2022-11-20 LAB — HEPATIC FUNCTION PANEL
ALT: 189 U/L — ABNORMAL HIGH (ref 0–53)
AST: 312 U/L — ABNORMAL HIGH (ref 0–37)
Albumin: 3.8 g/dL (ref 3.5–5.2)
Alkaline Phosphatase: 96 U/L (ref 39–117)
Bilirubin, Direct: 0.2 mg/dL (ref 0.0–0.3)
Total Bilirubin: 0.5 mg/dL (ref 0.2–1.2)
Total Protein: 7.6 g/dL (ref 6.0–8.3)

## 2022-11-20 LAB — CBC WITH DIFFERENTIAL/PLATELET
Basophils Absolute: 0.2 10*3/uL — ABNORMAL HIGH (ref 0.0–0.1)
Basophils Relative: 1.1 % (ref 0.0–3.0)
Eosinophils Absolute: 0.2 10*3/uL (ref 0.0–0.7)
Eosinophils Relative: 1.1 % (ref 0.0–5.0)
HCT: 41.8 % (ref 39.0–52.0)
Hemoglobin: 13.6 g/dL (ref 13.0–17.0)
Lymphocytes Relative: 4.8 % — ABNORMAL LOW (ref 12.0–46.0)
Lymphs Abs: 0.7 10*3/uL (ref 0.7–4.0)
MCHC: 32.6 g/dL (ref 30.0–36.0)
MCV: 88.4 fl (ref 78.0–100.0)
Monocytes Absolute: 1.6 10*3/uL — ABNORMAL HIGH (ref 0.1–1.0)
Monocytes Relative: 11.1 % (ref 3.0–12.0)
Neutro Abs: 12.1 10*3/uL — ABNORMAL HIGH (ref 1.4–7.7)
Neutrophils Relative %: 81.9 % — ABNORMAL HIGH (ref 43.0–77.0)
Platelets: 345 10*3/uL (ref 150.0–400.0)
RBC: 4.73 Mil/uL (ref 4.22–5.81)
RDW: 14.8 % (ref 11.5–15.5)
WBC: 14.7 10*3/uL — ABNORMAL HIGH (ref 4.0–10.5)

## 2022-11-20 LAB — BASIC METABOLIC PANEL
BUN: 16 mg/dL (ref 6–23)
CO2: 29 mEq/L (ref 19–32)
Calcium: 8.8 mg/dL (ref 8.4–10.5)
Chloride: 97 mEq/L (ref 96–112)
Creatinine, Ser: 0.98 mg/dL (ref 0.40–1.50)
GFR: 74.94 mL/min (ref >60.00)
Glucose, Bld: 128 mg/dL — ABNORMAL HIGH (ref 70–99)
Potassium: 4.4 mEq/L (ref 3.5–5.1)
Sodium: 134 mEq/L — ABNORMAL LOW (ref 135–145)

## 2022-11-20 NOTE — Assessment & Plan Note (Signed)
New.  Found on CT scan done while in hospital.  Pt reports he was aware of an 'aneurysm in my stomach' but daughter was not aware.  He is well known to vascular given his PAD and will refer back to them for complete evaluation and possible treatment if needed.

## 2022-11-20 NOTE — Assessment & Plan Note (Signed)
New.  Also found on CT scan while hospitalized.  As he has a long smoking history this will need to be followed by pulmonary.  Referral placed.

## 2022-11-20 NOTE — Patient Instructions (Addendum)
Follow up as needed or as scheduled We'll notify you of your lab results and make any changes if needed [employer] will be changing your O2 tanks.  You can return our tank when everything is settled We will figure out what we need to get you a wheelchair They will call you to schedule your Pulmonary and Vascular appts IF your O2 is LESS THAN 84%- please go to the ER No med changes at this time Call with any questions or concerns Hang in there!

## 2022-11-20 NOTE — Telephone Encounter (Signed)
I have not seen these forms in [facility]

## 2022-11-20 NOTE — Progress Notes (Signed)
   Subjective:    Patient ID: Christopher Pena, male    DOB: 1945-09-26, 77 y.o.   MRN: [id number]  HPI Hospital f/u- pt was admitted 3/25-27 w/ a COPD exacerbation.  He was treated w/ steroids and bronchodilators.  Was d/c'd home on his usual home O2 (2-3L), a prednisone taper, and started on Breztri 160/9/4.62mcg 2 puffs BID.  He was advised to resume care w/ Pulmonary.  Appt scheduled for 4/30- is hoping for sooner appt.  He is currently on 4L and O2 is only 78-81%.  Needs a new O2 prescription  Was found on imaging to have incidental 4.2 cm infrarenal abdominal aortic aneurysm.  He already follows w/ Vascular- will refer back to them for evaluation.  Also found to have 56mm solid pulmonary nodule in RUL.  They are recommending repeat CT in 6-12 months and then again in 18-24 months.  D/C summary recommends CMP/CBC in 1 week.  Pt denies SOB while seated and on O2.  Will develop SOB w/ minimal exertion.  Continues to wheeze 'a good bit'.  + chronic cough.  Pt reports feeling better than when he was hospitalized.   Review of Systems For ROS see HPI     Objective:   Physical Exam Vitals reviewed.  Constitutional:      General: He is not in acute distress.    Appearance: He is ill-appearing (frail, elderly man sitting in wheelchair).  HENT:     Head: Normocephalic and atraumatic.  Cardiovascular:     Rate and Rhythm: Regular rhythm. Tachycardia present.     Comments: Distant heart sounds Pulmonary:     Effort: Pulmonary effort is normal. No respiratory distress.     Breath sounds: Rhonchi (end expiratory rhonchi throughout) present. No wheezing.  Skin:    General: Skin is warm and dry.  Neurological:     Mental Status: He is alert and oriented to person, place, and time.  Psychiatric:        Mood and Affect: Mood normal.        Behavior: Behavior normal.        Thought Content: Thought content normal.           Assessment & Plan:

## 2022-11-20 NOTE — Assessment & Plan Note (Signed)
Ongoing issue.  Pt's O2 was 78-81% on 5L O2 via his concentrator and back up tank.  Thankfully this improved to >90% when we switched him to our tank.  I sent in a new order for increased O2 flow and he took our tank home w/ him until that delivery could be arranged.  Also ordered a wheel chair as he struggles w/ minimal exertion.  Placed pulmonary referral as he is not scheduled until 4/30 and needs a sooner appt.  Encouraged him to take the [facility] and the nebulizers as prescribed.  Reviewed O2 threshold that should prompt ER evaluation.  Pt expressed understanding and is in agreement w/ plan.

## 2022-11-20 NOTE — Progress Notes (Signed)
Care Management & Coordination Services Pharmacy Note  11/20/2022 Name:  Christopher Pena MRN:  [id number] DOB:  1946-04-09  Summary: PharmD FU visit.  Recent COPD exacerbation.  They started him on maintenance Breztri which he reports he is using once or twice per day.  Counseled to please use as directed - 2 puffs twice daily.  This will prevent recurrent exacerbations.  Price is affordable at this time.  All other chronic conditions well controlled.  Recommendations/Changes made from today's visit: No changes - contact me with increased copays  Follow up plan: Fu 6 months   Subjective: Christopher Pena is an 77 y.o. year old male who is a primary patient of Birdie Riddle, Aundra Millet, MD.  The care coordination team was consulted for assistance with disease management and care coordination needs.    Engaged with patient by telephone for follow up visit.  Recent office visits:  01/02/22 Annye Asa <D - Family Medicine - Anxiety - RESTART the Buspirone twice daily. Follow up in 6 months for physical.   Recent consult visits:  None noted.    Hospital visits:  None in previous 6 months   Objective:  Lab Results  Component Value Date   CREATININE 0.89 11/13/2022   BUN 17 11/13/2022   GFR 75.15 07/03/2022   GFRNONAA >60 11/13/2022   GFRAA >60 12/24/2018   NA 137 11/13/2022   K 4.3 11/13/2022   CALCIUM 8.8 (L) 11/13/2022   CO2 31 11/13/2022   GLUCOSE 119 (H) 11/13/2022    Lab Results  Component Value Date/Time   HGBA1C 5.8 (H) 11/12/2022 03:03 AM   HGBA1C 6.1 07/04/2022 10:55 AM   GFR 75.15 07/03/2022 01:11 PM   GFR 83.80 01/02/2022 01:38 PM    Last diabetic Eye exam: No results found for: "HMDIABEYEEXA"  Last diabetic Foot exam: No results found for: "HMDIABFOOTEX"   Lab Results  Component Value Date   CHOL 140 07/03/2022   HDL 68.60 07/03/2022   LDLCALC 46 07/03/2022   TRIG 130.0 07/03/2022   CHOLHDL 2 07/03/2022       Latest Ref Rng & Units 11/12/2022     3:03 AM 07/03/2022    1:11 PM 01/02/2022    1:38 PM  Hepatic Function  Total Protein 6.5 - 8.1 g/dL 7.5  7.9  8.0   Albumin 3.5 - 5.0 g/dL 3.4  4.0  4.2   AST 15 - 41 U/L 30  19  37   ALT 0 - 44 U/L 13  8  25    Alk Phosphatase 38 - 126 U/L 66  67  88   Total Bilirubin 0.3 - 1.2 mg/dL 0.7  0.4  0.3   Bilirubin, Direct 0.0 - 0.3 mg/dL  0.1  0.1     Lab Results  Component Value Date/Time   TSH 2.24 07/03/2022 01:11 PM   TSH 1.43 01/02/2022 01:38 PM       Latest Ref Rng & Units 11/12/2022    3:03 AM 11/11/2022   10:42 PM 07/03/2022    1:11 PM  CBC  WBC 4.0 - 10.5 K/uL 9.0  10.8  11.0   Hemoglobin 13.0 - 17.0 g/dL 12.5  12.2  13.7   Hematocrit 39.0 - 52.0 % 39.1  39.2  42.0   Platelets 150 - 400 K/uL 262  273  339.0     No results found for: "VD25OH", "VITAMINB12"  Clinical ASCVD: No  The 10-year ASCVD risk score (Arnett DK, et al., 2019) is: 22.3%  Values used to calculate the score:     Age: 25 years     Sex: Male     Is Non-Hispanic African American: No     Diabetic: No     Tobacco smoker: No     Systolic Blood Pressure: [id number] mmHg     Is BP treated: No     HDL Cholesterol: 68.6 mg/dL     Total Cholesterol: 140 mg/dL        11/20/2022   11:24 AM 07/03/2022   12:45 PM 02/27/2022    3:30 PM  Depression screen PHQ 2/9  Decreased Interest 0 0 0  Down, Depressed, Hopeless 0 0 0  PHQ - 2 Score 0 0 0  Altered sleeping 0 0   Tired, decreased energy 1 0   Change in appetite 1 0   Feeling bad or failure about yourself  0 0   Trouble concentrating 0 0   Moving slowly or fidgety/restless 0 0   Suicidal thoughts 0 0   PHQ-9 Score 2 0   Difficult doing work/chores Not difficult at all Not difficult at all      Social History   Tobacco Use  Smoking Status Former   Packs/day: 0.25   Years: 50.00   Additional pack years: 0.00   Total pack years: 12.50   Types: Cigarettes   Start date: 11/14/2015   Quit date: 11/13/2016   Years since quitting: 6.0  Smokeless Tobacco  Never   BP Readings from Last 3 Encounters:  11/20/22 130/70  11/13/22 91/73  07/03/22 116/60   Pulse Readings from Last 3 Encounters:  11/20/22 (!) 124  11/13/22 70  07/03/22 (!) 116   Wt Readings from Last 3 Encounters:  11/11/22 145 lb (65.8 kg)  07/03/22 153 lb 6 oz (69.6 kg)  01/02/22 157 lb 6.4 oz (71.4 kg)   BMI Readings from Last 3 Encounters:  11/11/22 22.71 kg/m  07/03/22 24.02 kg/m  01/02/22 24.65 kg/m    Allergies  Allergen Reactions   Clindamycin/Lincomycin Diarrhea    Medications Reviewed Today     Reviewed by Edythe Clarity, RPH (Pharmacist) on 11/20/22 at 1525  Med List Status: <None>   Medication Order Taking? Sig Documenting Provider Last Dose Status Informant  acetaminophen (TYLENOL) 500 MG tablet [id number] Yes Take 500 mg by mouth every 6 (six) hours as needed for headache. [provider] Taking Active Self  albuterol (VENTOLIN HFA) 108 (90 Base) MCG/ACT inhaler [id number] Yes INHALE TWO PUFFS BY MOUTH INTO LUNGS every SIX hours AS NEEDED FOR WHEEZING AND/OR SHORTNESS OF BREATH [name], MD Taking Active   aspirin EC 81 MG EC tablet [id number] Yes Take 1 tablet (81 mg total) by mouth daily at 6 (six) AM. Swallow whole. Gabriel Earing, PA-C Taking Active Self  Budeson-Glycopyrrol-Formoterol (BREZTRI AEROSPHERE) 160-9-4.8 MCG/ACT AERO [id number] Yes Inhale 2 puffs into the lungs 2 (two) times daily. Ghimire, Henreitta Leber, MD Taking Active   busPIRone (BUSPAR) 7.5 MG tablet [id number] Yes Take 1 tablet (7.5 mg total) by mouth 2 (two) times daily. [name], MD Taking Active   clopidogrel (PLAVIX) 75 MG tablet [id number] Yes TAKE ONE TABLET BY MOUTH ONCE DAILY [name], MD Taking Active   FLUAD QUADRIVALENT 0.5 ML injection [id number] Yes  [provider] Taking Active   guaiFENesin (MUCINEX) 600 MG 12 hr tablet [id number] Yes Take 1 tablet (600 mg total) by mouth 2 (two) times daily. Jonetta Osgood, MD  Taking Active  ipratropium-albuterol (DUONEB) 0.5-2.5 (3) MG/3ML SOLN [id number] Yes Inhale 3 mLs by nebulization every 4 (four) hours as needed (shortness of breath). Ghimire, Henreitta Leber, MD Taking Active   naloxone [facility]) nasal spray 4 mg/0.1 mL [id number] Yes SMARTSIG:In Nostril [provider] Taking Active Self  oxyCODONE (ROXICODONE) 15 MG immediate release tablet [id number] Yes Take 1 tablet (15 mg total) by mouth every 6 (six) hours as needed for pain. Dagoberto Ligas, PA-C Taking Active   rosuvastatin (CRESTOR) 20 MG tablet [id number] Yes TAKE ONE TABLET BY MOUTH ONCE DAILY [name], MD Taking Active   sertraline (ZOLOFT) 25 MG tablet [id number] Yes TAKE ONE TABLET BY MOUTH ONCE DAILY [name], MD Taking Active             SDOH:  (Social Determinants of Health) assessments and interventions performed: No, done within year Financial Resource Strain: Low Risk  (02/27/2022)   Overall Financial Resource Strain (CARDIA)    Difficulty of Paying Living Expenses: Not hard at all   Food Insecurity: No Food Insecurity (11/14/2022)   Hunger Vital Sign    Worried About Running Out of Food in the Last Year: Never true    Ran Out of Food in the Last Year: Never true    SDOH Interventions    Flowsheet Row Telephone from 11/14/2022 in [place] from 02/27/2022 in [place] at [place] Management from 12/23/2019 in [place] at [place] Interventions Intervention Not Indicated Intervention Not Indicated Other (Comment)  Housing Interventions -- Intervention Not Indicated --  Transportation Interventions Intervention Not Indicated Intervention Not Indicated Other (Comment)  Financial Strain Interventions -- Intervention Not Indicated --  Physical Activity Interventions -- Intervention Not  Indicated --  Stress Interventions -- Intervention Not Indicated --  Social Connections Interventions -- Intervention Not Indicated --       Medication Assistance: None required.  Patient affirms current coverage meets needs.  Medication Access: Within the past 30 days, how often has patient missed a dose of medication? 0 Is a pillbox or other method used to improve adherence? No  Factors that may affect medication adherence? no barriers identified Are meds synced by current pharmacy? Yes  Are meds delivered by current pharmacy? Yes  Does patient experience delays in picking up medications due to transportation concerns? No   [facility] Services Reviewed: Is patient disadvantaged to use [facility]?: No  Current Rx insurance plan: [facility] Name and location of Current pharmacy:  [facility], [place] [address] Phone: [phone] Fax: [phone]  [facility] - [place], [place] - [address] [place] [address] Phone: [phone] Fax: [phone]  [id number] [place], [place] [address] [place] [address] Phone: [phone] Fax: [place] [id number] Phone: [phone] Fax: [phone]  [id number] [place], [place] - [address] [place] [address] Phone: [phone] Fax: [phone]  [facility] [address] [place] [address] Phone: [phone] Fax: [phone]  [facility] services reviewed with patient today?: Yes  Patient requests to transfer care to [facility]acy?: No  Reason patient declined to change  pharmacies: Patient is already actively enrolled with [facility]  Compliance/Adherence/Medication  fill history: Care Gaps: None  Star-Rating Drugs: Rosuvastatin 20mg  10/03/22 30ds - patient is [facility] patient was supposed to get delivery 11/05/22   Assessment/Plan      Hyperlipidemia: (LDL goal < 100) 11/20/22 -Controlled, LDL is 46 recently -Current treatment: Rosuvastatin 20mg  Appropriate, Effective, Safe, Accessible -Medications previously tried: none noted -Educated on Cholesterol goals;  Benefits of statin for ASCVD risk reduction; -Continue current medication, continue routine screenings.  -LDL well controlled at most recent CPE  COPD (Goal: control symptoms and prevent exacerbations) 11/20/22 -Not ideally controlled -Current treatment  Albuterol HFA 61mcg prn Appropriate, Effective, Safe, Accessible Breztri 160-9-4.8 mcg/act Appropriate, Effective, Safe, Accessible -Medications previously tried: Pulmicort, Breo  -Current COPD Classification:  Group B -Pulmonary function testing: none recent -Exacerbations requiring treatment in last 6 months: yes, 1  -He is on Oxygen 100% of the time -He was using maintenance inhaler "once or twice per day"  TLC  Date Value Ref Range Status  06/18/2016 6.54 L Preliminary    -Counseled on Proper inhaler technique; Benefits of consistent maintenance inhaler use When to use rescue inhaler Differences between maintenance and rescue inhalers Recent exacerbation, he was started on Breztri.  Counseled on using this as "maintenance" like his other medications he takes on daily basis.  This will prevent further exacerbation.  Use albuterol only prn for rescue.  Patient is agreeable, currently affordable - no changes at this time.       Beverly Milch, PharmD Clinical Pharmacist  [facility] [phone]

## 2022-11-21 ENCOUNTER — Telehealth: Payer: Self-pay

## 2022-11-21 ENCOUNTER — Other Ambulatory Visit: Payer: Self-pay

## 2022-11-21 ENCOUNTER — Telehealth: Payer: Self-pay | Admitting: Pharmacist

## 2022-11-21 ENCOUNTER — Telehealth: Payer: Self-pay | Admitting: Family Medicine

## 2022-11-21 DIAGNOSIS — D729 Disorder of white blood cells, unspecified: Secondary | ICD-10-CM

## 2022-11-21 DIAGNOSIS — R82998 Other abnormal findings in urine: Secondary | ICD-10-CM

## 2022-11-21 DIAGNOSIS — R7989 Other specified abnormal findings of blood chemistry: Secondary | ICD-10-CM

## 2022-11-21 MED ORDER — DOXYCYCLINE HYCLATE 100 MG PO TABS
100.0000 mg | ORAL_TABLET | Freq: Two times a day (BID) | ORAL | 0 refills | Status: DC
Start: 2022-11-21 — End: 2023-02-07

## 2022-11-21 NOTE — Telephone Encounter (Signed)
Pt aware of lab results and repeat CBC and LFT orders are in . Lab only visit has been scheduled

## 2022-11-21 NOTE — Telephone Encounter (Signed)
Charge sheet attached and placed in front bin.

## 2022-11-21 NOTE — Telephone Encounter (Signed)
No charge sheet needed they needed the office notes from 11/20/22 visit

## 2022-11-21 NOTE — Progress Notes (Signed)
Care Management & Coordination Services Pharmacy Team   Reason for Encounter: Medication coordination and delivery   Contacted patient to discuss medications and coordinate delivery from [facility]. Spoke with patient on 11/21/2022  Cycle dispensing form sent to Leata Mouse, CPP for review.   Last adherence delivery date:  11/05/22     Patient is due for next adherence delivery on: 12/04/22  This delivery to include: Vials  30 Days   Sertraline 25 mg 1 tablet daily Clopidogrel 75 mg 1 tablet daily  Docusate 100 mg 1 capsule daily Rosuvastatin 20 mg 1 tablet daily Buspirone 7.5mg  1 tablet twice daily  Patient declined the following medications this month: None  No refill request needed.  Confirmed delivery date of 12/04/22, advised patient that pharmacy will contact them the morning of delivery.   Any concerns about your medications? No  How often do you forget or accidentally miss a dose? Rarely  Do you use a pillbox? /Yes  Is patient in packaging No  If yes  What is the date on your next pill pack?  Any concerns or issues with your packaging?   Medications: Outpatient Encounter Medications as of 11/21/2022  Medication Sig   doxycycline (VIBRA-TABS) 100 MG tablet Take 1 tablet (100 mg total) by mouth 2 (two) times daily.   acetaminophen (TYLENOL) 500 MG tablet Take 500 mg by mouth every 6 (six) hours as needed for headache.   albuterol (VENTOLIN HFA) 108 (90 Base) MCG/ACT inhaler INHALE TWO PUFFS BY MOUTH INTO LUNGS every SIX hours AS NEEDED FOR WHEEZING AND/OR SHORTNESS OF BREATH   aspirin EC 81 MG EC tablet Take 1 tablet (81 mg total) by mouth daily at 6 (six) AM. Swallow whole.   Budeson-Glycopyrrol-Formoterol (BREZTRI AEROSPHERE) 160-9-4.8 MCG/ACT AERO Inhale 2 puffs into the lungs 2 (two) times daily.   busPIRone (BUSPAR) 7.5 MG tablet Take 1 tablet (7.5 mg total) by mouth 2 (two) times daily.   clopidogrel (PLAVIX) 75 MG tablet TAKE ONE TABLET BY MOUTH ONCE  DAILY   FLUAD QUADRIVALENT 0.5 ML injection    guaiFENesin (MUCINEX) 600 MG 12 hr tablet Take 1 tablet (600 mg total) by mouth 2 (two) times daily.   ipratropium-albuterol (DUONEB) 0.5-2.5 (3) MG/3ML SOLN Inhale 3 mLs by nebulization every 4 (four) hours as needed (shortness of breath).   naloxone (NARCAN) nasal spray 4 mg/0.1 mL SMARTSIG:In Nostril   oxyCODONE (ROXICODONE) 15 MG immediate release tablet Take 1 tablet (15 mg total) by mouth every 6 (six) hours as needed for pain.   rosuvastatin (CRESTOR) 20 MG tablet TAKE ONE TABLET BY MOUTH ONCE DAILY   sertraline (ZOLOFT) 25 MG tablet TAKE ONE TABLET BY MOUTH ONCE DAILY   No facility-administered encounter medications on file as of 11/21/2022.   BP Readings from Last 3 Encounters:  11/20/22 130/70  11/13/22 91/73  07/03/22 116/60    Pulse Readings from Last 3 Encounters:  11/20/22 (!) 124  11/13/22 70  07/03/22 (!) 116    Lab Results  Component Value Date/Time   HGBA1C 5.8 (H) 11/12/2022 03:03 AM   HGBA1C 6.1 07/04/2022 10:55 AM   Lab Results  Component Value Date   CREATININE 0.98 11/20/2022   BUN 16 11/20/2022   GFR 74.94 11/20/2022   GFRNONAA >60 11/13/2022   GFRAA >60 12/24/2018   NA 134 (L) 11/20/2022   K 4.4 11/20/2022   CALCIUM 8.8 11/20/2022   CO2 29 11/20/2022     Future Appointments  Date Time Provider [place]  11/27/2022 10:00 AM [facility]  12/17/2022  1:30 PM Tanda Rockers, MD [facility] None  05/28/2023  2:45 PM Rosana Hoes, Jerilynn Mages, [place], [facility]

## 2022-11-21 NOTE — Telephone Encounter (Signed)
-----   Message from [name], MD sent at 11/21/2022  7:30 AM EDT ----- Your white blood cell count is elevated compared to when you were in the hospital.  This could be due to your Prednisone but it could also mean there is an underlying infection.  We will start Doxycycline 100mg  twice daily x10 days (#20, no refills) and repeat your CBC in 1 week at a lab only visit (dx increased WBC)  Your liver enzymes have also increased considerably.  Please HOLD your Rosuvastatin (Crestor) and any Tylenol containing products.  We will also repeat your LFTs at that same lab only visit in 1 week (dx elevated LFTS)  If you develop fever, worsening shortness of breath, abd pain, confusion, or any other symptoms that are new or different- please go to the ER for a complete evaluation.

## 2022-11-22 ENCOUNTER — Telehealth: Payer: Self-pay | Admitting: Family Medicine

## 2022-11-22 DIAGNOSIS — F419 Anxiety disorder, unspecified: Secondary | ICD-10-CM

## 2022-11-22 DIAGNOSIS — Z9181 History of falling: Secondary | ICD-10-CM

## 2022-11-22 DIAGNOSIS — J439 Emphysema, unspecified: Secondary | ICD-10-CM | POA: Diagnosis not present

## 2022-11-22 DIAGNOSIS — Z955 Presence of coronary angioplasty implant and graft: Secondary | ICD-10-CM | POA: Diagnosis not present

## 2022-11-22 DIAGNOSIS — Z7902 Long term (current) use of antithrombotics/antiplatelets: Secondary | ICD-10-CM

## 2022-11-22 DIAGNOSIS — Z7982 Long term (current) use of aspirin: Secondary | ICD-10-CM

## 2022-11-22 DIAGNOSIS — R911 Solitary pulmonary nodule: Secondary | ICD-10-CM | POA: Diagnosis not present

## 2022-11-22 DIAGNOSIS — J9621 Acute and chronic respiratory failure with hypoxia: Secondary | ICD-10-CM | POA: Diagnosis not present

## 2022-11-22 DIAGNOSIS — Z9981 Dependence on supplemental oxygen: Secondary | ICD-10-CM

## 2022-11-22 DIAGNOSIS — G894 Chronic pain syndrome: Secondary | ICD-10-CM | POA: Diagnosis not present

## 2022-11-22 DIAGNOSIS — I739 Peripheral vascular disease, unspecified: Secondary | ICD-10-CM | POA: Diagnosis not present

## 2022-11-22 DIAGNOSIS — Z7951 Long term (current) use of inhaled steroids: Secondary | ICD-10-CM

## 2022-11-22 DIAGNOSIS — Z7952 Long term (current) use of systemic steroids: Secondary | ICD-10-CM

## 2022-11-22 DIAGNOSIS — Z87891 Personal history of nicotine dependence: Secondary | ICD-10-CM | POA: Diagnosis not present

## 2022-11-22 DIAGNOSIS — M549 Dorsalgia, unspecified: Secondary | ICD-10-CM | POA: Diagnosis not present

## 2022-11-22 DIAGNOSIS — F32A Depression, unspecified: Secondary | ICD-10-CM | POA: Diagnosis not present

## 2022-11-22 DIAGNOSIS — J441 Chronic obstructive pulmonary disease with (acute) exacerbation: Secondary | ICD-10-CM | POA: Diagnosis not present

## 2022-11-22 DIAGNOSIS — I10 Essential (primary) hypertension: Secondary | ICD-10-CM | POA: Diagnosis not present

## 2022-11-22 NOTE — Telephone Encounter (Signed)
Received home health from [facility]. Placed in front bin with charge sheet.

## 2022-11-22 NOTE — Telephone Encounter (Signed)
Forms faxed and placed in scan  

## 2022-11-22 NOTE — Telephone Encounter (Signed)
Forms completed and returned to [facility] 

## 2022-11-22 NOTE — Telephone Encounter (Signed)
Forms placed in Dr Tabori to be signed folder  

## 2022-11-25 ENCOUNTER — Telehealth: Payer: Self-pay | Admitting: Family Medicine

## 2022-11-25 DIAGNOSIS — I739 Peripheral vascular disease, unspecified: Secondary | ICD-10-CM | POA: Diagnosis not present

## 2022-11-25 DIAGNOSIS — J439 Emphysema, unspecified: Secondary | ICD-10-CM | POA: Diagnosis not present

## 2022-11-25 DIAGNOSIS — J9621 Acute and chronic respiratory failure with hypoxia: Secondary | ICD-10-CM | POA: Diagnosis not present

## 2022-11-25 DIAGNOSIS — I10 Essential (primary) hypertension: Secondary | ICD-10-CM | POA: Diagnosis not present

## 2022-11-25 DIAGNOSIS — J441 Chronic obstructive pulmonary disease with (acute) exacerbation: Secondary | ICD-10-CM | POA: Diagnosis not present

## 2022-11-25 NOTE — Telephone Encounter (Signed)
Placed in Dr Beverely Low to be reviewed folder

## 2022-11-25 NOTE — Telephone Encounter (Signed)
Received "for review" home health forms from [facility] no charge sheet attached please advise next steps if needed.  

## 2022-11-26 ENCOUNTER — Telehealth: Payer: Self-pay | Admitting: Family Medicine

## 2022-11-26 NOTE — Telephone Encounter (Signed)
Can we amend his last OV ?

## 2022-11-26 NOTE — Telephone Encounter (Signed)
Caller name: Chasity From [facility]   On DPR?: Yes  Call back number: [phone]  Provider they see: Sheliah Hatch, MD  Reason for call: Chasity called stating that the wheel chair narrative were missing OV notes. Chasity would like is wheel chair narrative to be faxed to [phone]

## 2022-11-26 NOTE — Telephone Encounter (Signed)
Faxed to the requested number

## 2022-11-27 ENCOUNTER — Other Ambulatory Visit (INDEPENDENT_AMBULATORY_CARE_PROVIDER_SITE_OTHER): Payer: Medicare Other

## 2022-11-27 ENCOUNTER — Telehealth: Payer: Self-pay

## 2022-11-27 DIAGNOSIS — D729 Disorder of white blood cells, unspecified: Secondary | ICD-10-CM | POA: Diagnosis not present

## 2022-11-27 DIAGNOSIS — R7989 Other specified abnormal findings of blood chemistry: Secondary | ICD-10-CM

## 2022-11-27 LAB — CBC WITH DIFFERENTIAL/PLATELET
Basophils Absolute: 0.1 10*3/uL (ref 0.0–0.1)
Basophils Relative: 1.1 % (ref 0.0–3.0)
Eosinophils Absolute: 0.1 10*3/uL (ref 0.0–0.7)
Eosinophils Relative: 1 % (ref 0.0–5.0)
HCT: 40.7 % (ref 39.0–52.0)
Hemoglobin: 13.3 g/dL (ref 13.0–17.0)
Lymphocytes Relative: 7.7 % — ABNORMAL LOW (ref 12.0–46.0)
Lymphs Abs: 1 10*3/uL (ref 0.7–4.0)
MCHC: 32.7 g/dL (ref 30.0–36.0)
MCV: 88.6 fl (ref 78.0–100.0)
Monocytes Absolute: 0.8 10*3/uL (ref 0.1–1.0)
Monocytes Relative: 6.4 % (ref 3.0–12.0)
Neutro Abs: 10.4 10*3/uL — ABNORMAL HIGH (ref 1.4–7.7)
Neutrophils Relative %: 83.8 % — ABNORMAL HIGH (ref 43.0–77.0)
Platelets: 437 10*3/uL — ABNORMAL HIGH (ref 150.0–400.0)
RBC: 4.6 Mil/uL (ref 4.22–5.81)
RDW: 14.5 % (ref 11.5–15.5)
WBC: 12.4 10*3/uL — ABNORMAL HIGH (ref 4.0–10.5)

## 2022-11-27 LAB — HEPATIC FUNCTION PANEL
ALT: 15 U/L (ref 0–53)
AST: 15 U/L (ref 0–37)
Albumin: 3.6 g/dL (ref 3.5–5.2)
Alkaline Phosphatase: 89 U/L (ref 39–117)
Bilirubin, Direct: 0.1 mg/dL (ref 0.0–0.3)
Total Bilirubin: 0.6 mg/dL (ref 0.2–1.2)
Total Protein: 7.3 g/dL (ref 6.0–8.3)

## 2022-11-27 NOTE — Telephone Encounter (Signed)
Pt aware of lab results 

## 2022-11-27 NOTE — Telephone Encounter (Signed)
-----   Message from Sheliah Hatch, MD sent at 11/27/2022  2:20 PM EDT ----- White blood cell counts are improving.  This is good news!  Your liver functions are back to normal- this is EXCELLENT!  No changes at this time

## 2022-11-28 DIAGNOSIS — J439 Emphysema, unspecified: Secondary | ICD-10-CM | POA: Diagnosis not present

## 2022-11-28 DIAGNOSIS — J441 Chronic obstructive pulmonary disease with (acute) exacerbation: Secondary | ICD-10-CM | POA: Diagnosis not present

## 2022-11-28 DIAGNOSIS — I739 Peripheral vascular disease, unspecified: Secondary | ICD-10-CM | POA: Diagnosis not present

## 2022-11-28 DIAGNOSIS — I10 Essential (primary) hypertension: Secondary | ICD-10-CM | POA: Diagnosis not present

## 2022-11-28 DIAGNOSIS — J9621 Acute and chronic respiratory failure with hypoxia: Secondary | ICD-10-CM | POA: Diagnosis not present

## 2022-11-30 DIAGNOSIS — I739 Peripheral vascular disease, unspecified: Secondary | ICD-10-CM | POA: Diagnosis not present

## 2022-11-30 DIAGNOSIS — J441 Chronic obstructive pulmonary disease with (acute) exacerbation: Secondary | ICD-10-CM | POA: Diagnosis not present

## 2022-11-30 DIAGNOSIS — I10 Essential (primary) hypertension: Secondary | ICD-10-CM | POA: Diagnosis not present

## 2022-11-30 DIAGNOSIS — J9621 Acute and chronic respiratory failure with hypoxia: Secondary | ICD-10-CM | POA: Diagnosis not present

## 2022-11-30 DIAGNOSIS — J439 Emphysema, unspecified: Secondary | ICD-10-CM | POA: Diagnosis not present

## 2022-12-02 DIAGNOSIS — J441 Chronic obstructive pulmonary disease with (acute) exacerbation: Secondary | ICD-10-CM | POA: Diagnosis not present

## 2022-12-02 DIAGNOSIS — I739 Peripheral vascular disease, unspecified: Secondary | ICD-10-CM | POA: Diagnosis not present

## 2022-12-02 DIAGNOSIS — I10 Essential (primary) hypertension: Secondary | ICD-10-CM | POA: Diagnosis not present

## 2022-12-02 DIAGNOSIS — J439 Emphysema, unspecified: Secondary | ICD-10-CM | POA: Diagnosis not present

## 2022-12-02 DIAGNOSIS — J9621 Acute and chronic respiratory failure with hypoxia: Secondary | ICD-10-CM | POA: Diagnosis not present

## 2022-12-04 DIAGNOSIS — I739 Peripheral vascular disease, unspecified: Secondary | ICD-10-CM | POA: Diagnosis not present

## 2022-12-04 DIAGNOSIS — J439 Emphysema, unspecified: Secondary | ICD-10-CM | POA: Diagnosis not present

## 2022-12-04 DIAGNOSIS — J441 Chronic obstructive pulmonary disease with (acute) exacerbation: Secondary | ICD-10-CM | POA: Diagnosis not present

## 2022-12-04 DIAGNOSIS — J9621 Acute and chronic respiratory failure with hypoxia: Secondary | ICD-10-CM | POA: Diagnosis not present

## 2022-12-04 DIAGNOSIS — I10 Essential (primary) hypertension: Secondary | ICD-10-CM | POA: Diagnosis not present

## 2022-12-05 DIAGNOSIS — M5136 Other intervertebral disc degeneration, lumbar region: Secondary | ICD-10-CM | POA: Diagnosis not present

## 2022-12-05 DIAGNOSIS — G894 Chronic pain syndrome: Secondary | ICD-10-CM | POA: Diagnosis not present

## 2022-12-05 DIAGNOSIS — M5431 Sciatica, right side: Secondary | ICD-10-CM | POA: Diagnosis not present

## 2022-12-05 DIAGNOSIS — G890 Central pain syndrome: Secondary | ICD-10-CM | POA: Diagnosis not present

## 2022-12-05 DIAGNOSIS — M79604 Pain in right leg: Secondary | ICD-10-CM | POA: Diagnosis not present

## 2022-12-05 DIAGNOSIS — M545 Low back pain, unspecified: Secondary | ICD-10-CM | POA: Diagnosis not present

## 2022-12-05 DIAGNOSIS — M79605 Pain in left leg: Secondary | ICD-10-CM | POA: Diagnosis not present

## 2022-12-05 DIAGNOSIS — Z79891 Long term (current) use of opiate analgesic: Secondary | ICD-10-CM | POA: Diagnosis not present

## 2022-12-05 DIAGNOSIS — M5432 Sciatica, left side: Secondary | ICD-10-CM | POA: Diagnosis not present

## 2022-12-06 DIAGNOSIS — J441 Chronic obstructive pulmonary disease with (acute) exacerbation: Secondary | ICD-10-CM | POA: Diagnosis not present

## 2022-12-06 DIAGNOSIS — I10 Essential (primary) hypertension: Secondary | ICD-10-CM | POA: Diagnosis not present

## 2022-12-06 DIAGNOSIS — J9621 Acute and chronic respiratory failure with hypoxia: Secondary | ICD-10-CM | POA: Diagnosis not present

## 2022-12-06 DIAGNOSIS — J439 Emphysema, unspecified: Secondary | ICD-10-CM | POA: Diagnosis not present

## 2022-12-06 DIAGNOSIS — I739 Peripheral vascular disease, unspecified: Secondary | ICD-10-CM | POA: Diagnosis not present

## 2022-12-09 DIAGNOSIS — J9621 Acute and chronic respiratory failure with hypoxia: Secondary | ICD-10-CM | POA: Diagnosis not present

## 2022-12-09 DIAGNOSIS — I10 Essential (primary) hypertension: Secondary | ICD-10-CM | POA: Diagnosis not present

## 2022-12-09 DIAGNOSIS — J439 Emphysema, unspecified: Secondary | ICD-10-CM | POA: Diagnosis not present

## 2022-12-09 DIAGNOSIS — I739 Peripheral vascular disease, unspecified: Secondary | ICD-10-CM | POA: Diagnosis not present

## 2022-12-09 DIAGNOSIS — J441 Chronic obstructive pulmonary disease with (acute) exacerbation: Secondary | ICD-10-CM | POA: Diagnosis not present

## 2022-12-10 DIAGNOSIS — J9621 Acute and chronic respiratory failure with hypoxia: Secondary | ICD-10-CM | POA: Diagnosis not present

## 2022-12-10 DIAGNOSIS — I739 Peripheral vascular disease, unspecified: Secondary | ICD-10-CM | POA: Diagnosis not present

## 2022-12-10 DIAGNOSIS — J441 Chronic obstructive pulmonary disease with (acute) exacerbation: Secondary | ICD-10-CM | POA: Diagnosis not present

## 2022-12-10 DIAGNOSIS — I10 Essential (primary) hypertension: Secondary | ICD-10-CM | POA: Diagnosis not present

## 2022-12-10 DIAGNOSIS — J439 Emphysema, unspecified: Secondary | ICD-10-CM | POA: Diagnosis not present

## 2022-12-12 DIAGNOSIS — J9621 Acute and chronic respiratory failure with hypoxia: Secondary | ICD-10-CM | POA: Diagnosis not present

## 2022-12-12 DIAGNOSIS — I10 Essential (primary) hypertension: Secondary | ICD-10-CM | POA: Diagnosis not present

## 2022-12-12 DIAGNOSIS — I739 Peripheral vascular disease, unspecified: Secondary | ICD-10-CM | POA: Diagnosis not present

## 2022-12-12 DIAGNOSIS — J439 Emphysema, unspecified: Secondary | ICD-10-CM | POA: Diagnosis not present

## 2022-12-12 DIAGNOSIS — J441 Chronic obstructive pulmonary disease with (acute) exacerbation: Secondary | ICD-10-CM | POA: Diagnosis not present

## 2022-12-17 ENCOUNTER — Institutional Professional Consult (permissible substitution): Payer: Medicare Other | Admitting: Internal Medicine

## 2022-12-18 DIAGNOSIS — I739 Peripheral vascular disease, unspecified: Secondary | ICD-10-CM | POA: Diagnosis not present

## 2022-12-18 DIAGNOSIS — I10 Essential (primary) hypertension: Secondary | ICD-10-CM | POA: Diagnosis not present

## 2022-12-18 DIAGNOSIS — J439 Emphysema, unspecified: Secondary | ICD-10-CM | POA: Diagnosis not present

## 2022-12-18 DIAGNOSIS — J441 Chronic obstructive pulmonary disease with (acute) exacerbation: Secondary | ICD-10-CM | POA: Diagnosis not present

## 2022-12-18 DIAGNOSIS — J9621 Acute and chronic respiratory failure with hypoxia: Secondary | ICD-10-CM | POA: Diagnosis not present

## 2022-12-20 DIAGNOSIS — J9600 Acute respiratory failure, unspecified whether with hypoxia or hypercapnia: Secondary | ICD-10-CM | POA: Diagnosis not present

## 2022-12-23 ENCOUNTER — Telehealth: Payer: Self-pay | Admitting: Pharmacist

## 2022-12-23 NOTE — Progress Notes (Signed)
Care Management & Coordination Services Pharmacy Team   Reason for Encounter: Medication coordination and delivery   Contacted patient to discuss medications and coordinate delivery from [facility]. Unsuccessful outreach. Left voicemail for patient to return call. Cycle dispensing form sent to Erskine Emery for review.   Last adherence delivery date: 12/04/22      Patient is due for next adherence delivery on: 01/02/23  This delivery to include: Vials  30 Days   Sertraline 25 mg 1 tablet daily Clopidogrel 75 mg 1 tablet daily  Docusate 100 mg 1 capsule daily Rosuvastatin 20 mg 1 tablet daily Buspirone 7.5mg  1 tablet twice daily  Patient declined the following medications this month: N/A  No refill request needed.  Confirmed delivery date of 01/02/23, advised patient that pharmacy will contact them the morning of delivery.   Any concerns about your medications? No  How often do you forget or accidentally miss a dose? Rarely  Do you use a pillbox? Yes  Is patient in packaging No  If yes  What is the date on your next pill pack?  Any concerns or issues with your packaging?    Medications: Outpatient Encounter Medications as of 12/23/2022  Medication Sig   doxycycline (VIBRA-TABS) 100 MG tablet Take 1 tablet (100 mg total) by mouth 2 (two) times daily.   acetaminophen (TYLENOL) 500 MG tablet Take 500 mg by mouth every 6 (six) hours as needed for headache.   albuterol (VENTOLIN HFA) 108 (90 Base) MCG/ACT inhaler INHALE TWO PUFFS BY MOUTH INTO LUNGS every SIX hours AS NEEDED FOR WHEEZING AND/OR SHORTNESS OF BREATH   aspirin EC 81 MG EC tablet Take 1 tablet (81 mg total) by mouth daily at 6 (six) AM. Swallow whole.   Budeson-Glycopyrrol-Formoterol (BREZTRI AEROSPHERE) 160-9-4.8 MCG/ACT AERO Inhale 2 puffs into the lungs 2 (two) times daily.   busPIRone (BUSPAR) 7.5 MG tablet Take 1 tablet (7.5 mg total) by mouth 2 (two) times daily.   clopidogrel (PLAVIX) 75 MG tablet  TAKE ONE TABLET BY MOUTH ONCE DAILY   FLUAD QUADRIVALENT 0.5 ML injection    guaiFENesin (MUCINEX) 600 MG 12 hr tablet Take 1 tablet (600 mg total) by mouth 2 (two) times daily.   ipratropium-albuterol (DUONEB) 0.5-2.5 (3) MG/3ML SOLN Inhale 3 mLs by nebulization every 4 (four) hours as needed (shortness of breath).   naloxone (NARCAN) nasal spray 4 mg/0.1 mL SMARTSIG:In Nostril   oxyCODONE (ROXICODONE) 15 MG immediate release tablet Take 1 tablet (15 mg total) by mouth every 6 (six) hours as needed for pain.   rosuvastatin (CRESTOR) 20 MG tablet TAKE ONE TABLET BY MOUTH ONCE DAILY   sertraline (ZOLOFT) 25 MG tablet TAKE ONE TABLET BY MOUTH ONCE DAILY   No facility-administered encounter medications on file as of 12/23/2022.   BP Readings from Last 3 Encounters:  11/20/22 130/70  11/13/22 91/73  07/03/22 116/60    Pulse Readings from Last 3 Encounters:  11/20/22 (!) 124  11/13/22 70  07/03/22 (!) 116    Lab Results  Component Value Date/Time   HGBA1C 5.8 (H) 11/12/2022 03:03 AM   HGBA1C 6.1 07/04/2022 10:55 AM   Lab Results  Component Value Date   CREATININE 0.98 11/20/2022   BUN 16 11/20/2022   GFR 74.94 11/20/2022   GFRNONAA >60 11/13/2022   GFRAA >60 12/24/2018   NA 134 (L) 11/20/2022   K 4.4 11/20/2022   CALCIUM 8.8 11/20/2022   CO2 29 11/20/2022   Future Appointments  Date Time Provider Department  Center  05/28/2023  2:45 PM Earlene Plater, Rita Ohara, RPH [facility] None    [facility], [address]

## 2023-01-01 ENCOUNTER — Telehealth: Payer: Self-pay

## 2023-01-01 DIAGNOSIS — I739 Peripheral vascular disease, unspecified: Secondary | ICD-10-CM | POA: Diagnosis not present

## 2023-01-01 DIAGNOSIS — J441 Chronic obstructive pulmonary disease with (acute) exacerbation: Secondary | ICD-10-CM | POA: Diagnosis not present

## 2023-01-01 DIAGNOSIS — J9621 Acute and chronic respiratory failure with hypoxia: Secondary | ICD-10-CM | POA: Diagnosis not present

## 2023-01-01 DIAGNOSIS — I10 Essential (primary) hypertension: Secondary | ICD-10-CM | POA: Diagnosis not present

## 2023-01-01 DIAGNOSIS — J439 Emphysema, unspecified: Secondary | ICD-10-CM | POA: Diagnosis not present

## 2023-01-01 NOTE — Telephone Encounter (Signed)
[facility] sent forms to be reviewed placed in review folder for Dr Beverely Low

## 2023-01-02 ENCOUNTER — Telehealth: Payer: Self-pay

## 2023-01-02 DIAGNOSIS — G894 Chronic pain syndrome: Secondary | ICD-10-CM | POA: Diagnosis not present

## 2023-01-02 DIAGNOSIS — M5136 Other intervertebral disc degeneration, lumbar region: Secondary | ICD-10-CM | POA: Diagnosis not present

## 2023-01-02 DIAGNOSIS — M79605 Pain in left leg: Secondary | ICD-10-CM | POA: Diagnosis not present

## 2023-01-02 DIAGNOSIS — M5431 Sciatica, right side: Secondary | ICD-10-CM | POA: Diagnosis not present

## 2023-01-02 DIAGNOSIS — G890 Central pain syndrome: Secondary | ICD-10-CM | POA: Diagnosis not present

## 2023-01-02 DIAGNOSIS — M79604 Pain in right leg: Secondary | ICD-10-CM | POA: Diagnosis not present

## 2023-01-02 DIAGNOSIS — M5432 Sciatica, left side: Secondary | ICD-10-CM | POA: Diagnosis not present

## 2023-01-02 DIAGNOSIS — Z79891 Long term (current) use of opiate analgesic: Secondary | ICD-10-CM | POA: Diagnosis not present

## 2023-01-02 DIAGNOSIS — M545 Low back pain, unspecified: Secondary | ICD-10-CM | POA: Diagnosis not present

## 2023-01-02 NOTE — Telephone Encounter (Signed)
Pt called and states his Crestor is on auto refill at pharmacy. He state someone told him to stop the medication but looking at his labs and charts and we did not state in his chart to stop the medication . Do you want the pt to continue this medication ?

## 2023-01-03 NOTE — Telephone Encounter (Signed)
I have informed pt that he is to continue his Crestor pt expressed verbal understanding

## 2023-01-03 NOTE — Telephone Encounter (Signed)
I don't see any documentation or reason for him to stop this medication.  He should continue it until told otherwise

## 2023-01-03 NOTE — Telephone Encounter (Signed)
Left vm to call office

## 2023-01-20 DIAGNOSIS — J9600 Acute respiratory failure, unspecified whether with hypoxia or hypercapnia: Secondary | ICD-10-CM | POA: Diagnosis not present

## 2023-01-21 ENCOUNTER — Other Ambulatory Visit: Payer: Self-pay | Admitting: Family Medicine

## 2023-01-21 DIAGNOSIS — F419 Anxiety disorder, unspecified: Secondary | ICD-10-CM

## 2023-01-21 DIAGNOSIS — I739 Peripheral vascular disease, unspecified: Secondary | ICD-10-CM

## 2023-01-22 ENCOUNTER — Telehealth: Payer: Self-pay | Admitting: Pharmacist

## 2023-01-22 NOTE — Progress Notes (Signed)
Care Management & Coordination Services Pharmacy Team   Reason for Encounter: Medication coordination and delivery   Contacted patient to discuss medications and coordinate delivery from [facility]. Spoke with patient on 01/22/2023  Cycle dispensing form sent to Erskine Emery, CPP for review.   Last adherence delivery date: 01/02/23      Patient is due for next adherence delivery on: 02/03/23  This delivery to include: Vials  30 Days   No delivery this month per patient has plenty of maintenance medications on hand until next month  Sent to [facility] (co pay card)  Ipratropium Bromide Albuterol solution 0.5/3mg  Breztri Inhaler   Patient declined the following medications this month:  Sertraline 25 mg 1 tablet daily Clopidogrel 75 mg 1 tablet daily  Docusate 100 mg 1 capsule daily Rosuvastatin 20 mg 1 tablet daily Buspirone 7.5mg  1 tablet twice daily   No refill request needed.  Confirmed delivery date of 02/03/23, advised patient that pharmacy will contact them the morning of delivery.   Any concerns about your medications? No  How often do you forget or accidentally miss a dose? Rarely  Do you use a pillbox? Yes  Is patient in packaging No  If yes  What is the date on your next pill pack?  Any concerns or issues with your packaging?     Medications: Outpatient Encounter Medications as of 01/22/2023  Medication Sig   doxycycline (VIBRA-TABS) 100 MG tablet Take 1 tablet (100 mg total) by mouth 2 (two) times daily.   acetaminophen (TYLENOL) 500 MG tablet Take 500 mg by mouth every 6 (six) hours as needed for headache.   albuterol (VENTOLIN HFA) 108 (90 Base) MCG/ACT inhaler INHALE TWO PUFFS BY MOUTH INTO LUNGS every SIX hours AS NEEDED FOR WHEEZING AND/OR SHORTNESS OF BREATH   aspirin EC 81 MG EC tablet Take 1 tablet (81 mg total) by mouth daily at 6 (six) AM. Swallow whole.   Budeson-Glycopyrrol-Formoterol (BREZTRI AEROSPHERE) 160-9-4.8 MCG/ACT AERO  Inhale 2 puffs into the lungs 2 (two) times daily.   busPIRone (BUSPAR) 7.5 MG tablet Take 1 tablet (7.5 mg total) by mouth 2 (two) times daily.   clopidogrel (PLAVIX) 75 MG tablet TAKE ONE TABLET BY MOUTH ONCE DAILY   FLUAD QUADRIVALENT 0.5 ML injection    guaiFENesin (MUCINEX) 600 MG 12 hr tablet Take 1 tablet (600 mg total) by mouth 2 (two) times daily.   ipratropium-albuterol (DUONEB) 0.5-2.5 (3) MG/3ML SOLN Inhale 3 mLs by nebulization every 4 (four) hours as needed (shortness of breath).   naloxone (NARCAN) nasal spray 4 mg/0.1 mL SMARTSIG:In Nostril   oxyCODONE (ROXICODONE) 15 MG immediate release tablet Take 1 tablet (15 mg total) by mouth every 6 (six) hours as needed for pain.   rosuvastatin (CRESTOR) 20 MG tablet TAKE ONE TABLET BY MOUTH ONCE DAILY   sertraline (ZOLOFT) 25 MG tablet TAKE ONE TABLET BY MOUTH ONCE DAILY   No facility-administered encounter medications on file as of 01/22/2023.   BP Readings from Last 3 Encounters:  11/20/22 130/70  11/13/22 91/73  07/03/22 116/60    Pulse Readings from Last 3 Encounters:  11/20/22 (!) 124  11/13/22 70  07/03/22 (!) 116    Lab Results  Component Value Date/Time   HGBA1C 5.8 (H) 11/12/2022 03:03 AM   HGBA1C 6.1 07/04/2022 10:55 AM   Lab Results  Component Value Date   CREATININE 0.98 11/20/2022   BUN 16 11/20/2022   GFR 74.94 11/20/2022   GFRNONAA >60 11/13/2022  GFRAA >60 12/24/2018   NA 134 (L) 11/20/2022   K 4.4 11/20/2022   CALCIUM 8.8 11/20/2022   CO2 29 11/20/2022     Future Appointments  Date Time Provider Department Center  05/28/2023  2:45 PM Erroll Luna, [facility] None     [facility], [address]

## 2023-01-24 NOTE — Telephone Encounter (Signed)
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

## 2023-01-28 ENCOUNTER — Other Ambulatory Visit (HOSPITAL_COMMUNITY): Payer: Self-pay

## 2023-01-30 ENCOUNTER — Ambulatory Visit (INDEPENDENT_AMBULATORY_CARE_PROVIDER_SITE_OTHER): Payer: Medicare Other | Admitting: *Deleted

## 2023-01-30 DIAGNOSIS — Z0000 Encounter for general adult medical examination without abnormal findings: Secondary | ICD-10-CM

## 2023-01-30 DIAGNOSIS — M5136 Other intervertebral disc degeneration, lumbar region: Secondary | ICD-10-CM | POA: Diagnosis not present

## 2023-01-30 DIAGNOSIS — M79605 Pain in left leg: Secondary | ICD-10-CM | POA: Diagnosis not present

## 2023-01-30 DIAGNOSIS — M5432 Sciatica, left side: Secondary | ICD-10-CM | POA: Diagnosis not present

## 2023-01-30 DIAGNOSIS — M5431 Sciatica, right side: Secondary | ICD-10-CM | POA: Diagnosis not present

## 2023-01-30 DIAGNOSIS — M545 Low back pain, unspecified: Secondary | ICD-10-CM | POA: Diagnosis not present

## 2023-01-30 DIAGNOSIS — M79604 Pain in right leg: Secondary | ICD-10-CM | POA: Diagnosis not present

## 2023-01-30 DIAGNOSIS — G894 Chronic pain syndrome: Secondary | ICD-10-CM | POA: Diagnosis not present

## 2023-01-30 DIAGNOSIS — Z79899 Other long term (current) drug therapy: Secondary | ICD-10-CM | POA: Diagnosis not present

## 2023-01-30 DIAGNOSIS — Z79891 Long term (current) use of opiate analgesic: Secondary | ICD-10-CM | POA: Diagnosis not present

## 2023-01-30 DIAGNOSIS — G890 Central pain syndrome: Secondary | ICD-10-CM | POA: Diagnosis not present

## 2023-01-30 NOTE — Patient Instructions (Signed)
[name] , Thank you for taking time to come for your Medicare Wellness Visit. I appreciate your ongoing commitment to your health goals. Please review the following plan we discussed and let me know if I can assist you in the future.   Screening recommendations/referrals: Colonoscopy: no longer required Recommended yearly ophthalmology/optometry visit for glaucoma screening and checkup Recommended yearly dental visit for hygiene and checkup  Vaccinations: Influenza vaccine: up to date Pneumococcal vaccine: Education provided Tdap vaccine: Education provided Shingles vaccine: Education provided    Advanced directives: Education provided      Preventive Care 65 Years and Older, Male Preventive care refers to lifestyle choices and visits with your health care provider that can promote health and wellness. What does preventive care include? A yearly physical exam. This is also called an annual well check. Dental exams once or twice a year. Routine eye exams. Ask your health care provider how often you should have your eyes checked. Personal lifestyle choices, including: Daily care of your teeth and gums. Regular physical activity. Eating a healthy diet. Avoiding tobacco and drug use. Limiting alcohol use. Practicing safe sex. Taking low doses of aspirin every day. Taking vitamin and mineral supplements as recommended by your health care provider. What happens during an annual well check? The services and screenings done by your health care provider during your annual well check will depend on your age, overall health, lifestyle risk factors, and family history of disease. Counseling  Your health care provider may ask you questions about your: Alcohol use. Tobacco use. Drug use. Emotional well-being. Home and relationship well-being. Sexual activity. Eating habits. History of falls. Memory and ability to understand (cognition). Work and work Astronomer. Screening  You  may have the following tests or measurements: Height, weight, and BMI. Blood pressure. Lipid and cholesterol levels. These may be checked every 5 years, or more frequently if you are over 31 years old. Skin check. Lung cancer screening. You may have this screening every year starting at age 5 if you have a 30-pack-year history of smoking and currently smoke or have quit within the past 15 years. Fecal occult blood test (FOBT) of the stool. You may have this test every year starting at age 48. Flexible sigmoidoscopy or colonoscopy. You may have a sigmoidoscopy every 5 years or a colonoscopy every 10 years starting at age 31. Prostate cancer screening. Recommendations will vary depending on your family history and other risks. Hepatitis C blood test. Hepatitis B blood test. Sexually transmitted disease (STD) testing. Diabetes screening. This is done by checking your blood sugar (glucose) after you have not eaten for a while (fasting). You may have this done every 1-3 years. Abdominal aortic aneurysm (AAA) screening. You may need this if you are a current or former smoker. Osteoporosis. You may be screened starting at age 33 if you are at high risk. Talk with your health care provider about your test results, treatment options, and if necessary, the need for more tests. Vaccines  Your health care provider may recommend certain vaccines, such as: Influenza vaccine. This is recommended every year. Tetanus, diphtheria, and acellular pertussis (Tdap, Td) vaccine. You may need a Td booster every 10 years. Zoster vaccine. You may need this after age 66. Pneumococcal 13-valent conjugate (PCV13) vaccine. One dose is recommended after age 71. Pneumococcal polysaccharide (PPSV23) vaccine. One dose is recommended after age 72. Talk to your health care provider about which screenings and vaccines you need and how often you need them. This  information is not intended to replace advice given to you by your  health care provider. Make sure you discuss any questions you have with your health care provider. Document Released: 09/01/2015 Document Revised: 04/24/2016 Document Reviewed: 06/06/2015 Elsevier Interactive Patient Education  2017 [place] Prevention in the Home Falls can cause injuries. They can happen to people of all ages. There are many things you can do to make your home safe and to help prevent falls. What can I do on the outside of my home? Regularly fix the edges of walkways and driveways and fix any cracks. Remove anything that might make you trip as you walk through a door, such as a raised step or threshold. Trim any bushes or trees on the path to your home. Use bright outdoor lighting. Clear any walking paths of anything that might make someone trip, such as rocks or tools. Regularly check to see if handrails are loose or broken. Make sure that both sides of any steps have handrails. Any raised decks and porches should have guardrails on the edges. Have any leaves, snow, or ice cleared regularly. Use sand or salt on walking paths during winter. Clean up any spills in your garage right away. This includes oil or grease spills. What can I do in the bathroom? Use night lights. Install grab bars by the toilet and in the tub and shower. Do not use towel bars as grab bars. Use non-skid mats or decals in the tub or shower. If you need to sit down in the shower, use a plastic, non-slip stool. Keep the floor dry. Clean up any water that spills on the floor as soon as it happens. Remove soap buildup in the tub or shower regularly. Attach bath mats securely with double-sided non-slip rug tape. Do not have throw rugs and other things on the floor that can make you trip. What can I do in the bedroom? Use night lights. Make sure that you have a light by your bed that is easy to reach. Do not use any sheets or blankets that are too big for your bed. They should not hang down  onto the floor. Have a firm chair that has side arms. You can use this for support while you get dressed. Do not have throw rugs and other things on the floor that can make you trip. What can I do in the kitchen? Clean up any spills right away. Avoid walking on wet floors. Keep items that you use a lot in easy-to-reach places. If you need to reach something above you, use a strong step stool that has a grab bar. Keep electrical cords out of the way. Do not use floor polish or wax that makes floors slippery. If you must use wax, use non-skid floor wax. Do not have throw rugs and other things on the floor that can make you trip. What can I do with my stairs? Do not leave any items on the stairs. Make sure that there are handrails on both sides of the stairs and use them. Fix handrails that are broken or loose. Make sure that handrails are as long as the stairways. Check any carpeting to make sure that it is firmly attached to the stairs. Fix any carpet that is loose or worn. Avoid having throw rugs at the top or bottom of the stairs. If you do have throw rugs, attach them to the floor with carpet tape. Make sure that you have a light switch at the top  of the stairs and the bottom of the stairs. If you do not have them, ask someone to add them for you. What else can I do to help prevent falls? Wear shoes that: Do not have high heels. Have rubber bottoms. Are comfortable and fit you well. Are closed at the toe. Do not wear sandals. If you use a stepladder: Make sure that it is fully opened. Do not climb a closed stepladder. Make sure that both sides of the stepladder are locked into place. Ask someone to hold it for you, if possible. Clearly mark and make sure that you can see: Any grab bars or handrails. First and last steps. Where the edge of each step is. Use tools that help you move around (mobility aids) if they are needed. These include: Canes. Walkers. Scooters. Crutches. Turn  on the lights when you go into a dark area. Replace any light bulbs as soon as they burn out. Set up your furniture so you have a clear path. Avoid moving your furniture around. If any of your floors are uneven, fix them. If there are any pets around you, be aware of where they are. Review your medicines with your doctor. Some medicines can make you feel dizzy. This can increase your chance of falling. Ask your doctor what other things that you can do to help prevent falls. This information is not intended to replace advice given to you by your health care provider. Make sure you discuss any questions you have with your health care provider. Document Released: 06/01/2009 Document Revised: 01/11/2016 Document Reviewed: 09/09/2014 Elsevier Interactive Patient Education  2017 [employer].

## 2023-01-30 NOTE — Progress Notes (Signed)
Subjective:   Christopher Pena is a 77 y.o. male who presents for Medicare Annual/Subsequent preventive examination.  I connected with  Christopher Pena on 01/30/23 by a telephone enabled telemedicine application and verified that I am speaking with the correct person using two identifiers.   I discussed the limitations of evaluation and management by telemedicine. The patient expressed understanding and agreed to proceed.  Patient location: home  Provider location: telephone/home    Review of Systems     Cardiac Risk Factors include: advanced age (>40men, >42 women);male gender;hypertension     Objective:    Today's Vitals   There is no height or weight on file to calculate BMI.     01/30/2023    2:13 PM 11/12/2022    8:02 AM 02/27/2022    3:29 PM 07/06/2021   10:00 AM 12/17/2018    1:24 PM 06/29/2018    9:56 AM 09/20/2016    9:57 AM  Advanced Directives  Does Patient Have a Medical Advance Directive? No Yes No No No No No  Does patient want to make changes to medical advance directive?  No - Patient declined       Would patient like information on creating a medical advance directive? No - Patient declined  No - Patient declined Yes (Inpatient - patient requests chaplain consult to create a medical advance directive)   No - Patient declined    Current Medications (verified) Outpatient Encounter Medications as of 01/30/2023  Medication Sig   acetaminophen (TYLENOL) 500 MG tablet Take 500 mg by mouth every 6 (six) hours as needed for headache.   albuterol (VENTOLIN HFA) 108 (90 Base) MCG/ACT inhaler INHALE TWO PUFFS BY MOUTH INTO LUNGS every SIX hours AS NEEDED FOR WHEEZING AND/OR SHORTNESS OF BREATH   aspirin EC 81 MG EC tablet Take 1 tablet (81 mg total) by mouth daily at 6 (six) AM. Swallow whole.   Budeson-Glycopyrrol-Formoterol (BREZTRI AEROSPHERE) 160-9-4.8 MCG/ACT AERO Inhale 2 puffs into the lungs 2 (two) times daily.   busPIRone (BUSPAR) 7.5 MG tablet Take 1 tablet  (7.5 mg total) by mouth 2 (two) times daily.   clopidogrel (PLAVIX) 75 MG tablet TAKE ONE TABLET BY MOUTH ONCE DAILY   doxycycline (VIBRA-TABS) 100 MG tablet Take 1 tablet (100 mg total) by mouth 2 (two) times daily.   FLUAD QUADRIVALENT 0.5 ML injection    guaiFENesin (MUCINEX) 600 MG 12 hr tablet Take 1 tablet (600 mg total) by mouth 2 (two) times daily.   ipratropium-albuterol (DUONEB) 0.5-2.5 (3) MG/3ML SOLN Inhale 3 mLs by nebulization every 4 (four) hours as needed (shortness of breath).   naloxone (NARCAN) nasal spray 4 mg/0.1 mL SMARTSIG:In Nostril   oxyCODONE (ROXICODONE) 15 MG immediate release tablet Take 1 tablet (15 mg total) by mouth every 6 (six) hours as needed for pain.   rosuvastatin (CRESTOR) 20 MG tablet TAKE ONE TABLET BY MOUTH ONCE DAILY   sertraline (ZOLOFT) 25 MG tablet TAKE ONE TABLET BY MOUTH ONCE DAILY   No facility-administered encounter medications on file as of 01/30/2023.    Allergies (verified) Clindamycin/lincomycin   History: Past Medical History:  Diagnosis Date   Anxiety    Chronic low back pain    "degenerative spine dx'd ~ 7 yr ago" (06/08/2013)   Claudication [facility])    Constipation due to pain medication    COPD (chronic obstructive pulmonary disease) (HCC)    Hypertension    "moderately high; RX didn't help" (06/08/2013) - no longer on meds (  as of 09/19/16)   Neuromuscular disorder (HCC)    degen spine   Non-healing wound of lower extremity 06/09/2013   PAD (peripheral artery disease), PTA/stent IDEV & chocolate baloon 06/08/13 04/13/2013   Severely reduced ABI's of 0.3 bilaterally    Tobacco use 06/09/2013   Unexplained weight loss    Past Surgical History:  Procedure Laterality Date   ABDOMINAL AORTAGRAM  05/10/2013   Procedure: ABDOMINAL Ronny Flurry;  Surgeon: Runell Gess, MD;  Location: [facility] CATH LAB;  Service: Cardiovascular;;   ABI     04/09/13; abnormal   ANGIOPLASTY Left 05/21/2016   Procedure: ballon ANGIOPLASTY of femoral to  anterior to tibial bypass graft;  Surgeon: Nada Libman, MD;  Location: [facility] OR;  Service: Vascular;  Laterality: Left;   APPENDECTOMY  05/2001   ENDARTERECTOMY FEMORAL Left 12/08/2020   Procedure: REDO LEFT FEMORAL ARTERY ENDARTERECTOMY, LEFT FEMORAL ARTERY THROMBECTOMY;  Surgeon: Nada Libman, MD;  Location: [facility] OR;  Service: Vascular;  Laterality: Left;   FEMORAL ARTERY STENT Right 05/10/13   2 stents Rt SFA   FEMORAL ARTERY STENT Left 06/08/2013   FEMORAL-POPLITEAL BYPASS GRAFT Right 11/12/2013   Procedure: RIGHT  FEMORAL-BELOW KNEE POPLITEAL ARTERY BYPASS GRAFT USING NON- REVERSE RIGHT GREATER SAPHENOUS VEIN.;  Surgeon: Nada Libman, MD;  Location: [facility] OR;  Service: Vascular;  Laterality: Right;   FEMORAL-TIBIAL BYPASS GRAFT Left 12/08/2015   Procedure:  LEFT FEMORAL-ANTERIOR TIBIAL ARTERY BYPASS GRAFT;  Surgeon: Nada Libman, MD;  Location: [facility] OR;  Service: Vascular;  Laterality: Left;   FEMORAL-TIBIAL BYPASS GRAFT Left 09/20/2016   Procedure: REDO BYPASS GRAFT FEMORAL-ANTERIOR TIBIAL ARTERY;  Surgeon: Nada Libman, MD;  Location: [facility] OR;  Service: Vascular;  Laterality: Left;   I & D EXTREMITY Left 07/05/2021   Procedure: IRRIGATION AND DEBRIDEMENT LEFT LOWER EXTREMITY;  Surgeon: Victorino Sparrow, MD;  Location: [facility] OR;  Service: Vascular;  Laterality: Left;   I & D EXTREMITY Left 07/10/2021   Procedure: IRRIGATION AND DEBRIDEMENT LEFT LEG WOUND;  Surgeon: Victorino Sparrow, MD;  Location: [facility] OR;  Service: Vascular;  Laterality: Left;   LOWER EXTREMITY ANGIOGRAM Bilateral 05/10/2013   Procedure: LOWER EXTREMITY ANGIOGRAM;  Surgeon: Runell Gess, MD;  Location: [facility] CATH LAB;  Service: Cardiovascular;  Laterality: Bilateral;   LOWER EXTREMITY ANGIOGRAM N/A 06/08/2013   Procedure: LOWER EXTREMITY ANGIOGRAM;  Surgeon: Runell Gess, MD;  Location: [facility] CATH LAB;  Service: Cardiovascular;  Laterality: N/A;   LOWER EXTREMITY ANGIOGRAM N/A 10/28/2013   Procedure: LOWER EXTREMITY ANGIOGRAM;   Surgeon: Runell Gess, MD;  Location: [facility] CATH LAB;  Service: Cardiovascular;  Laterality: N/A;   LOWER EXTREMITY ANGIOGRAM N/A 05/16/2014   Procedure: LOWER EXTREMITY ANGIOGRAM;  Surgeon: Runell Gess, MD;  Location: [facility] CATH LAB;  Service: Cardiovascular;  Laterality: N/A;   PATCH ANGIOPLASTY Left 12/08/2020   Procedure: WITH PATCH ANGIOPLASTY USING 1 X 6 CM XENOSURE BIOLOGIC PATCH;  Surgeon: Nada Libman, MD;  Location: [facility] OR;  Service: Vascular;  Laterality: Left;   PATCH ANGIOPLASTY Left 07/05/2021   Procedure: ANTERIOR TIBIAL ARTERY PATCH USING GREATER SAPHENOUS VEIN;  Surgeon: Victorino Sparrow, MD;  Location: [facility] OR;  Service: Vascular;  Laterality: Left;   PERCUTANEOUS STENT INTERVENTION Right 05/10/2013   Procedure: PERCUTANEOUS STENT INTERVENTION;  Surgeon: Runell Gess, MD;  Location: [facility] CATH LAB;  Service: Cardiovascular;  Laterality: Right;  rt sfa and popliteal stent x2   PERIPHERAL VASCULAR CATHETERIZATION N/A 12/06/2015   Procedure:  Abdominal Aortogram w/Lower Extremity;  Surgeon: Nada Libman, MD;  Location: [facility] INVASIVE CV LAB;  Service: Cardiovascular;  Laterality: N/A;   PERIPHERAL VASCULAR CATHETERIZATION N/A 05/22/2016   Procedure: Abdominal Aortogram w/Lower Extremity;  Surgeon: Maeola Harman, MD;  Location: [facility] INVASIVE CV LAB;  Service: Cardiovascular;  Laterality: N/A;   PERIPHERAL VASCULAR CATHETERIZATION N/A 09/17/2016   Procedure: Abdominal Aortogram w/Lower Extremity;  Surgeon: Nada Libman, MD;  Location: [facility] INVASIVE CV LAB;  Service: Cardiovascular;  Laterality: N/A;   REMOVAL OF GRAFT Left 07/05/2021   Procedure: RESECTION OF LEFT LOWER EXTREMITY BYPASS GRAFT;  Surgeon: Victorino Sparrow, MD;  Location: [facility] OR;  Service: Vascular;  Laterality: Left;   THROMBECTOMY FEMORAL ARTERY Left 05/21/2016   Procedure: THROMBECTOMY FEMORAL TO ANTERIOR TIBIAL BYPASS GRAFT;  Surgeon: Nada Libman, MD;  Location: [facility] OR;  Service: Vascular;  Laterality: Left;    VEIN HARVEST Left 12/08/2015   Procedure: USING NON REVERSE  LEFT GREATER SAPHENOUS VEIN;  Surgeon: Nada Libman, MD;  Location: [facility] OR;  Service: Vascular;  Laterality: Left;   VEIN HARVEST Left 07/05/2021   Procedure: VEIN HARVEST LEFT GREATER SAPHENOUS;  Surgeon: Victorino Sparrow, MD;  Location: [facility] OR;  Service: Vascular;  Laterality: Left;   Family History  Problem Relation Age of Onset   Hyperlipidemia Mother    Heart disease Mother    Diabetes Mother    Cancer Father        Liver   Social History   Socioeconomic History   Marital status: Divorced    Spouse name: Not on file   Number of children: Not on file   Years of education: Not on file   Highest education level: Not on file  Occupational History   Occupation: former golf pro  Tobacco Use   Smoking status: Former    Packs/day: 0.25    Years: 50.00    Additional pack years: 0.00    Total pack years: 12.50    Types: Cigarettes    Start date: 11/14/2015    Quit date: 11/13/2016    Years since quitting: 6.2   Smokeless tobacco: Never  Vaping Use   Vaping Use: Never used  Substance and Sexual Activity   Alcohol use: No    Alcohol/week: 0.0 standard drinks of alcohol    Comment: 06/08/2013 "quit 02/2007; drank my share before that though"   Drug use: No   Sexual activity: Yes  Other Topics Concern   Not on file  Social History Narrative   Not on file   Social Determinants of Health   Financial Resource Strain: Low Risk  (01/30/2023)   Overall Financial Resource Strain (CARDIA)    Difficulty of Paying Living Expenses: Not hard at all  Food Insecurity: No Food Insecurity (01/30/2023)   Hunger Vital Sign    Worried About Running Out of Food in the Last Year: Never true    Ran Out of Food in the Last Year: Never true  Transportation Needs: No Transportation Needs (01/30/2023)   PRAPARE - Administrator, Civil Service (Medical): No    Lack of Transportation (Non-Medical): No  Physical Activity:  Inactive (01/30/2023)   Exercise Vital Sign    Days of Exercise per Week: 0 days    Minutes of Exercise per Session: 0 min  Stress: No Stress Concern Present (01/30/2023)   [employer] of Occupational Health - Occupational Stress Questionnaire    Feeling of Stress : Not at all  Social Connections: Socially Isolated (01/30/2023)   Social Connection and Isolation Panel [NHANES]    Frequency of Communication with Friends and Family: Three times a week    Frequency of Social Gatherings with Friends and Family: Never    Attends Religious Services: Never    Database administrator or Organizations: No    Attends Engineer, structural: Never    Marital Status: Divorced    Tobacco Counseling Counseling given: Not Answered   Clinical Intake:  Pre-visit preparation completed: Yes  Pain : No/denies pain     Diabetes: No  How often do you need to have someone help you when you read instructions, pamphlets, or other written materials from your doctor or pharmacy?: 1 - Never  Diabetic?  no  Interpreter Needed?: No  Information entered by :: Remi Haggard LPN   Activities of Daily Living    01/30/2023    2:14 PM 11/12/2022    8:19 AM  In your present state of health, do you have any difficulty performing the following activities:  Hearing? 1   Vision? 0   Difficulty concentrating or making decisions? 0   Walking or climbing stairs? 1   Dressing or bathing? 0   Doing errands, shopping? 0 0  Preparing Food and eating ? N   Using the Toilet? N   In the past six months, have you accidently leaked urine? N   Do you have problems with loss of bowel control? N   Managing your Medications? N   Managing your Finances? N   Housekeeping or managing your Housekeeping? N     Patient Care Team: Sheliah Hatch, MD as PCP - General (Family Medicine) Runell Gess, MD as Consulting Physician (Cardiology) [facility] (Pain Medicine) Nada Libman, MD  as Consulting Physician (Vascular Surgery) Erroll Luna, [facility] (Pharmacist)  Indicate any recent Medical Services you may have received from other than [facility] providers in the past year (date may be approximate).     Assessment:   This is a routine wellness examination for [place].  Hearing/Vision screen Hearing Screening - Comments:: Bilateral hearing aids Vision Screening - Comments:: Not up to date  Dietary issues and exercise activities discussed: Current Exercise Habits: The patient does not participate in regular exercise at present, Exercise limited by: respiratory conditions(s)   Goals Addressed   None    Depression Screen    01/30/2023    2:18 PM 11/20/2022   11:24 AM 07/03/2022   12:45 PM 02/27/2022    3:30 PM 02/27/2022    3:28 PM 01/02/2022   12:54 PM 03/31/2019    9:45 AM  PHQ 2/9 Scores  PHQ - 2 Score 0 0 0 0 0 1 0  PHQ- 9 Score 5 2 0   7 0    Fall Risk    01/30/2023    2:11 PM 11/20/2022   11:24 AM 07/03/2022   12:45 PM 02/27/2022    3:30 PM 01/02/2022   12:54 PM  Fall Risk   Falls in the past year? 0 0 0 0 1  Number falls in past yr: 0 0  0 0  Injury with Fall? 0 0  0 0  Risk for fall due to :  No Fall Risks No Fall Risks  History of fall(s)  Follow up Falls evaluation completed;Education provided;Falls prevention discussed Falls evaluation completed Falls evaluation completed Education provided Falls evaluation completed    FALL RISK PREVENTION PERTAINING TO THE HOME:  Any stairs in or around the home? No  If so, are there any without handrails? No  Home free of loose throw rugs in walkways, pet beds, electrical cords, etc? Yes  Adequate lighting in your home to reduce risk of falls? Yes   ASSISTIVE DEVICES UTILIZED TO PREVENT FALLS:  Life alert? No  Use of a cane, walker or w/c? No  Grab bars in the bathroom? Yes  Shower chair or bench in shower? Yes  Elevated toilet seat or a handicapped toilet? Yes   TIMED UP AND GO:  Was the test performed?  No .   Cognitive Function:        01/30/2023    2:15 PM  6CIT Screen  What Year? 0 points  What month? 0 points  What time? 0 points  Count back from 20 0 points  Months in reverse 0 points  Repeat phrase 8 points  Total Score 8 points    Immunizations Immunization History  Administered Date(s) Administered   Fluad Quad(high Dose 65+) 04/15/2019, 07/07/2021   Influenza Split 06/13/2022   Influenza Whole 06/10/2016   Influenza, High Dose Seasonal PF 05/11/2014, 06/16/2018   Influenza-Unspecified 05/19/2013, 06/16/2018, 05/15/2020, 07/10/2021   PFIZER(Purple Top)SARS-COV-2 Vaccination 10/02/2019, 10/25/2019, 06/13/2022   Pneumococcal Conjugate-13 05/13/2016   Respiratory Syncytial Virus Vaccine,Recomb Aduvanted(Arexvy) 06/13/2022    TDAP status: Due, Education has been provided regarding the importance of this vaccine. Advised may receive this vaccine at local pharmacy or Health Dept. Aware to provide a copy of the vaccination record if obtained from local pharmacy or Health Dept. Verbalized acceptance and understanding.  Flu Vaccine status: Up to date  Pneumococcal vaccine status: Due, Education has been provided regarding the importance of this vaccine. Advised may receive this vaccine at local pharmacy or Health Dept. Aware to provide a copy of the vaccination record if obtained from local pharmacy or Health Dept. Verbalized acceptance and understanding.  Covid-19 vaccine status: Information provided on how to obtain vaccines.   Qualifies for Shingles Vaccine? Yes   Zostavax completed No   Shingrix Completed?: No.    Education has been provided regarding the importance of this vaccine. Patient has been advised to call insurance company to determine out of pocket expense if they have not yet received this vaccine. Advised may also receive vaccine at local pharmacy or Health Dept. Verbalized acceptance and understanding.  Screening Tests Health Maintenance  Topic Date Due    INFLUENZA VACCINE  03/20/2023   Medicare Annual Wellness (AWV)  01/30/2024   HPV VACCINES  Aged Out   DTaP/Tdap/Td  Discontinued   Pneumonia Vaccine 4+ Years old  Discontinued   COVID-19 Vaccine  Discontinued   Hepatitis C Screening  Discontinued   Zoster Vaccines- Shingrix  Discontinued    Health Maintenance  There are no preventive care reminders to display for this patient.   Colorectal cancer screening: No longer required.   Lung Cancer Screening: (Low Dose CT Chest recommended if Age 35-80 years, 30 pack-year currently smoking OR have quit w/in 15years.) does not qualify.   Lung Cancer Screening Referral:   Additional Screening:  Hepatitis C Screening  never done  Vision Screening: Recommended annual ophthalmology exams for early detection of glaucoma and other disorders of the eye. Is the patient up to date with their annual eye exam?   no Who is the provider or what is the name of the office in which the patient attends annual eye exams?  If pt is not established with a provider,  would they like to be referred to a provider to establish care? No .   Dental Screening: Recommended annual dental exams for proper oral hygiene  Community Resource Referral / Chronic Care Management: CRR required this visit?  No   CCM required this visit?  No      Plan:     I have personally reviewed and noted the following in the patient's chart:   Medical and social history Use of alcohol, tobacco or illicit drugs  Current medications and supplements including opioid prescriptions. Patient is currently taking opioid prescriptions. Information provided to patient regarding non-opioid alternatives. Patient advised to discuss non-opioid treatment plan with their provider. Functional ability and status Nutritional status Physical activity Advanced directives List of other physicians Hospitalizations, surgeries, and ER visits in previous 12 months Vitals Screenings to include  cognitive, depression, and falls Referrals and appointments  In addition, I have reviewed and discussed with patient certain preventive protocols, quality metrics, and best practice recommendations. A written personalized care plan for preventive services as well as general preventive health recommendations were provided to patient.     Remi Haggard, LPN   [id number]/56/[id number]   Nurse Notes:

## 2023-02-07 ENCOUNTER — Telehealth: Payer: Self-pay | Admitting: Pharmacist

## 2023-02-07 ENCOUNTER — Other Ambulatory Visit: Payer: Medicare Other | Admitting: Pharmacist

## 2023-02-07 NOTE — Progress Notes (Unsigned)
Pharmacy Quality Measure Review  This patient is appearing on a report for being at risk of failing the adherence measure for cholesterol (statin) medications this calendar year.   Medication: rosuvastatin 20mg  2024 fill dates: 02/15; 04/11; 05/15 - filled for 30 days. Patient is using adherence packaging with [facility]. He should have been due to have 30 days of meds filled 02/01/2023 but per patient he got ahead when pharmacy changed from filling 90ds to 30 ds. (It looks like rosuvastatin was possibly filled 90 tablets by [facility] 07/02/2022 and 90 tablets by [facility] on 07/01/2022)  He states he will not need refill until mid July.   Also noticed patient is using Breztri inhaler. He reports that cost is currently $47 / month but in past inhalers have been higher cost in coverage gap.  Screened and applied for medication assistance program for [place] thru [place] [employer] [place] [employer].  Patient was approved 02/07/2023 thru 08/19/2023.  Will just need Rx for [facility] sent to [facility]. Will request from PCP or pulmonologist.

## 2023-02-07 NOTE — Telephone Encounter (Signed)
Spoke with patient regarding statin adherence and medication cost for [employer] - see phone visit notes.

## 2023-02-10 ENCOUNTER — Telehealth: Payer: Self-pay

## 2023-02-10 MED ORDER — BREZTRI AEROSPHERE 160-9-4.8 MCG/ACT IN AERO: 2.0000 | INHALATION_SPRAY | Freq: Two times a day (BID) | RESPIRATORY_TRACT | 1 refills | Status: DC

## 2023-02-10 NOTE — Telephone Encounter (Signed)
[place] forms to be completed placed in Dr Beverely Low to be signed folder. Labels need to be printed printer down

## 2023-02-10 NOTE — Telephone Encounter (Signed)
Form completed and returned to [place] 

## 2023-02-10 NOTE — Telephone Encounter (Signed)
Forms faxed back and placed in my blue folder to the left of my computer

## 2023-02-19 DIAGNOSIS — J9600 Acute respiratory failure, unspecified whether with hypoxia or hypercapnia: Secondary | ICD-10-CM | POA: Diagnosis not present

## 2023-02-27 DIAGNOSIS — M79605 Pain in left leg: Secondary | ICD-10-CM | POA: Diagnosis not present

## 2023-02-27 DIAGNOSIS — M545 Low back pain, unspecified: Secondary | ICD-10-CM | POA: Diagnosis not present

## 2023-02-27 DIAGNOSIS — Z79891 Long term (current) use of opiate analgesic: Secondary | ICD-10-CM | POA: Diagnosis not present

## 2023-02-27 DIAGNOSIS — M5432 Sciatica, left side: Secondary | ICD-10-CM | POA: Diagnosis not present

## 2023-02-27 DIAGNOSIS — M5136 Other intervertebral disc degeneration, lumbar region: Secondary | ICD-10-CM | POA: Diagnosis not present

## 2023-02-27 DIAGNOSIS — M5431 Sciatica, right side: Secondary | ICD-10-CM | POA: Diagnosis not present

## 2023-02-27 DIAGNOSIS — G894 Chronic pain syndrome: Secondary | ICD-10-CM | POA: Diagnosis not present

## 2023-02-27 DIAGNOSIS — M79604 Pain in right leg: Secondary | ICD-10-CM | POA: Diagnosis not present

## 2023-02-27 DIAGNOSIS — Z79899 Other long term (current) drug therapy: Secondary | ICD-10-CM | POA: Diagnosis not present

## 2023-02-27 DIAGNOSIS — G890 Central pain syndrome: Secondary | ICD-10-CM | POA: Diagnosis not present

## 2023-03-04 ENCOUNTER — Other Ambulatory Visit: Payer: Medicare Other | Admitting: Pharmacist

## 2023-03-04 NOTE — Progress Notes (Unsigned)
03/04/2023 Name: Christopher Pena MRN: [id number] DOB: 26-May-1946  Chief Complaint  Patient presents with   Medication Management    Follow up   Christopher Pena is a 77 y.o. year old male who the Clinical Pharmacist has been following for adherence and assistance with medication access.  Coordinated with patient assistance program today.    They were referred to the pharmacist by a quality report for assistance in managing medication access.   Subjective: This patient is appearing on a report for being at risk of failing the adherence measure for cholesterol (statin) medications this calendar year.   Medication: rosuvastatin 20mg  2024 fill dates: 02/15; 04/11; 05/15 - filled for 30 days. Patient is using adherence packaging with [facility]. He should have been due to have 30 days of meds filled 02/01/2023 but per patient he got ahead when pharmacy changed from filling 90ds to 30 ds. (It looks like rosuvastatin was possibly filled 90 tablets by [facility] 07/02/2022 and [facility])  Reviewed refill history today and patient filled rosuvastatin along with other maintenance medications 02/28/2023 for 30 day supply.   At our last visit also applied for medication assistance program for [place] thru [place] [facility].  Patient was approved 02/07/2023 thru 08/19/2023.  Rx was sent to [facility] 02/10/2023   Medication Access/Adherence  Current Pharmacy:  [facility] [address] - [place], [place] - [address] [place] [address] Phone: [phone] Fax: [phone]  [facility] - [place], [place] - [address] [place] [address] Phone: [phone] Fax: [phone]  [facility]atsy Lager [address] [place] [address] Phone: [phone] Fax: [phone]  [id number] [place], [place] - [address] [place] [address] Phone: [phone] Fax: [phone]  [facility] [address] [place] [address] Phone: [phone] Fax: [phone]  [facility] - [place], [place] - [address] [place] [address] Phone: [phone] Fax: [phone]   Objective:  Lab Results  Component Value Date   HGBA1C 5.8 (H) 11/12/2022    Lab Results  Component Value Date   CREATININE 0.98 11/20/2022   BUN 16 11/20/2022   NA 134 (L) 11/20/2022   K 4.4 11/20/2022   CL 97 11/20/2022   CO2 29 11/20/2022    Lab Results  Component Value Date   CHOL 140 07/03/2022   HDL 68.60 07/03/2022   LDLCALC 46 07/03/2022   TRIG 130.0 07/03/2022   CHOLHDL 2 07/03/2022    Medications Reviewed Today     Reviewed by Henrene Pastor, RPH-CPP (Pharmacist) on 02/07/23 at 1643  Med List Status: <None>   Medication Order Taking? Sig Documenting Provider Last Dose Status Informant  acetaminophen (TYLENOL) 500 MG tablet [id number] Yes Take 500 mg by mouth every 6 (six) hours as needed for headache. [provider] Taking Active Self  albuterol (VENTOLIN HFA) 108 (90 Base) MCG/ACT inhaler [id number] Yes INHALE TWO PUFFS BY MOUTH INTO LUNGS every SIX hours AS NEEDED FOR WHEEZING AND/OR SHORTNESS OF BREATH Sheliah Hatch, MD Taking Active   aspirin EC 81 MG EC tablet [id number] Yes Take 1 tablet (81 mg total) by mouth daily at 6 (six) AM. Swallow whole. Dara Lords, PA-C Taking Active Self  Budeson-Glycopyrrol-Formoterol (BREZTRI AEROSPHERE) 160-9-4.8 MCG/ACT AERO  [id number] Yes Inhale 2 puffs into the lungs 2 (two) times daily. Ghimire, Werner Lean, MD Taking Active   busPIRone (BUSPAR) 7.5 MG tablet [id number] Yes Take 1 tablet (7.5 mg total) by mouth 2 (two) times daily. Sheliah Hatch, MD Taking Active   clopidogrel (PLAVIX) 75 MG tablet [id number] Yes TAKE ONE TABLET BY MOUTH ONCE DAILY Sheliah Hatch, MD Taking  Active   guaiFENesin (MUCINEX) 600 MG 12 hr tablet [id number] Yes Take 1 tablet (600 mg total) by mouth 2 (two) times daily. Ghimire, Werner Lean, MD Taking Active   ipratropium-albuterol (DUONEB) 0.5-2.5 (3) MG/3ML SOLN [id number] Yes Inhale 3 mLs by nebulization every 4 (four) hours as needed (shortness of breath). Maretta Bees, MD Taking Active   naloxone [facility]) nasal spray 4 mg/0.1 mL [id number] No SMARTSIG:In Nostril  Patient not taking: Reported on 02/07/2023   [provider] Not Taking Active Self  oxyCODONE (ROXICODONE) 15 MG immediate release tablet [id number] Yes Take 1 tablet (15 mg total) by mouth every 6 (six) hours as needed for pain. Emilie Rutter, PA-C Taking Active   rosuvastatin (CRESTOR) 20 MG tablet [id number] Yes TAKE ONE TABLET BY MOUTH ONCE DAILY Sheliah Hatch, MD Taking Active   sertraline (ZOLOFT) 25 MG tablet [id number] Yes TAKE ONE TABLET BY MOUTH ONCE DAILY Sheliah Hatch, MD Taking Active               Assessment/Plan:   Hyperlipidemia/ASCVD Risk Reduction: -continue rosuvastatin 20mg  daily - Patient filled for 30 days 02/28/2023 (last impactable date was 03/05/2023)  COPD: Continue to use Breztri inhaler - called [place] [facility] [place] and was provided with tracking number for his first shipment - [phone].  Per tracking report Christopher Pena was delivered 02/22/2023  Follow Up Plan: 1 month to review adherence.   Henrene Pastor, PharmD Clinical Pharmacist [facility]  [facility]

## 2023-03-22 DIAGNOSIS — J9600 Acute respiratory failure, unspecified whether with hypoxia or hypercapnia: Secondary | ICD-10-CM | POA: Diagnosis not present

## 2023-03-28 ENCOUNTER — Other Ambulatory Visit: Payer: Medicare Other | Admitting: Pharmacist

## 2023-03-28 DIAGNOSIS — J9611 Chronic respiratory failure with hypoxia: Secondary | ICD-10-CM

## 2023-03-28 DIAGNOSIS — J449 Chronic obstructive pulmonary disease, unspecified: Secondary | ICD-10-CM

## 2023-03-28 NOTE — Addendum Note (Signed)
Addended by: Henrene Pastor B on: 03/28/2023 03:28 PM   Modules accepted: Orders

## 2023-03-28 NOTE — Progress Notes (Addendum)
03/28/2023 Name: Christopher Pena MRN: [id number] DOB: 04-May-1946  Chief Complaint  Patient presents with   Medication Management   Hyperlipidemia    Statin adherence   Christopher Pena is a 77 y.o. year old male who the Clinical Pharmacist has been following for adherence and assistance with medication access. Spoke with patient for phone visit today.   They were referred to the pharmacist by a quality report for assistance in managing medication access.   Subjective: This patient is appearing on a report for being at risk of failing the adherence measure for cholesterol (statin) medications this calendar year.   Medication: rosuvastatin 20mg  2024 fill dates: 02/15; 04/11; 05/15 and 7/12  filled for 30 days each time.  Patient had gotten on rosuvastatin when pharmacy changed from filling 90ds to 30 ds. (It looks like rosuvastatin was possibly filled 90 tablets by [facility] 07/02/2022 and [facility])  Patient states he is due to have maintenance medications filled and expects delivery next week around 8/15.   COPD:  Current therapy: Breztri - inhaler 2 puff twice a day; Duo Neb - uses up to twice a day; albuterol inhaler as needed;  Uses supplemental O2 - 5L  Patient has received Breztri inhalers from [facility] and Me Program. Received 3 inhalers in July 2024.  Patient was approved 02/07/2023 thru 08/19/2023. He also asks for refill for DuoNeb.    He reports having more difficulty breathing especially when walking. He is finding it difficult to get to providers' offices for appointments. He sees pain management monthly and this has been harder for him. His daughter is going to go with him to appointment Monday .   Current Pharmacy:  [facility] - [place], [place] - [address] [place] [address] Phone: [phone] Fax: [phone]  Patsy Lager [address] [place] [address] Phone: [phone] Fax:  [phone]  [id number] - [place], [place] - [address] [place] [address] Phone: [phone] Fax: [phone]  [facility] [address] [place] [address] Phone: [phone] Fax: [phone]  [facility] - [place], [place] - [address] [place] [address] Phone: [phone] Fax: [phone]   Objective:  Lab Results  Component Value Date   HGBA1C 5.8 (H) 11/12/2022    Lab Results  Component Value Date   CREATININE 0.98 11/20/2022   BUN 16 11/20/2022   NA 134 (L) 11/20/2022   K 4.4 11/20/2022   CL 97 11/20/2022   CO2 29 11/20/2022    Lab Results  Component Value Date   CHOL 140 07/03/2022   HDL 68.60 07/03/2022   LDLCALC 46 07/03/2022   TRIG 130.0 07/03/2022   CHOLHDL 2 07/03/2022    Medications Reviewed Today     Reviewed by Henrene Pastor, RPH-CPP (Pharmacist) on 03/28/23 at 1238  Med List Status: <None>   Medication Order Taking? Sig Documenting Provider Last Dose Status Informant  acetaminophen (TYLENOL) 500 MG tablet [id number]  Take 500 mg by mouth every 6 (six) hours as needed for headache. [provider]  Active Self  albuterol (VENTOLIN HFA) 108 (90 Base) MCG/ACT inhaler [id number] Yes INHALE TWO PUFFS BY MOUTH INTO LUNGS every SIX hours AS NEEDED FOR WHEEZING AND/OR SHORTNESS OF BREATH Sheliah Hatch, MD Taking Active   aspirin EC  81 MG EC tablet [id number] Yes Take 1 tablet (81 mg total) by mouth daily at 6 (six) AM. Swallow whole. Dara Lords, PA-C Taking Active Self  Budeson-Glycopyrrol-Formoterol (BREZTRI AEROSPHERE) 160-9-4.8 MCG/ACT AERO [id number] Yes Inhale 2 puffs into the lungs 2 (two) times daily. Sheliah Hatch, MD Taking Active   busPIRone (BUSPAR) 7.5 MG tablet [id number] Yes Take 1 tablet (7.5 mg total) by mouth 2 (two) times daily. Sheliah Hatch, MD Taking Active   clopidogrel  (PLAVIX) 75 MG tablet [id number] Yes TAKE ONE TABLET BY MOUTH ONCE DAILY Sheliah Hatch, MD Taking Active   guaiFENesin (MUCINEX) 600 MG 12 hr tablet [id number]  Take 1 tablet (600 mg total) by mouth 2 (two) times daily. Ghimire, Werner Lean, MD  Active   ipratropium-albuterol (DUONEB) 0.5-2.5 (3) MG/3ML SOLN [id number] Yes Inhale 3 mLs by nebulization every 4 (four) hours as needed (shortness of breath). Maretta Bees, MD Taking Active   naloxone [facility]) nasal spray 4 mg/0.1 mL [id number] No SMARTSIG:In Nostril  Patient not taking: Reported on 02/07/2023   [provider] Not Taking Active Self  oxyCODONE (ROXICODONE) 15 MG immediate release tablet [id number] Yes Take 1 tablet (15 mg total) by mouth every 6 (six) hours as needed for pain. Emilie Rutter, PA-C Taking Active   rosuvastatin (CRESTOR) 20 MG tablet [id number] Yes TAKE ONE TABLET BY MOUTH ONCE DAILY Sheliah Hatch, MD Taking Active   sertraline (ZOLOFT) 25 MG tablet [id number] Yes TAKE ONE TABLET BY MOUTH ONCE DAILY Sheliah Hatch, MD Taking Active               Assessment/Plan:   Hyperlipidemia/ASCVD Risk Reduction: -continue rosuvastatin 20mg  daily - Patient requested refills for all maintenance meds an delivery /next week. Spoke with technician at [facility] - he is scheduled for refills next week and delivery 04/03/2023.   COPD: -Continue to use Breztri inhaler - 2 inhalation twice a day; Breztri was delivered 02/22/2023 -Reminded to rinse mouth after each use.  - Requested refill for Duo Neb for patient - [facility] reports that sent RF request but appears it was sent to hospitalist and not PCP. Requested refill request to be sent to Dr Beverely Low.  - will also make Dr Loman Chroman aware that patient is having difficulty with shortness of breath and walking from car to office. Consider if palliative care might be appropriate.   Follow Up Plan: 1 month to review adherence.   Henrene Pastor, PharmD Clinical  Pharmacist [facility]  [facility]  03/28/2023 Addendum:  Dr Beverely Low recommended patient see pulmonologist. He has previously seen Dr. Sherene Sires but has been > 2 years. Patient would like to see Dr Delton Coombes if possible. New referral to Dr Delton Coombes was sent to [facility].

## 2023-03-31 DIAGNOSIS — G894 Chronic pain syndrome: Secondary | ICD-10-CM | POA: Diagnosis not present

## 2023-03-31 DIAGNOSIS — M5431 Sciatica, right side: Secondary | ICD-10-CM | POA: Diagnosis not present

## 2023-03-31 DIAGNOSIS — Z79891 Long term (current) use of opiate analgesic: Secondary | ICD-10-CM | POA: Diagnosis not present

## 2023-03-31 DIAGNOSIS — M545 Low back pain, unspecified: Secondary | ICD-10-CM | POA: Diagnosis not present

## 2023-03-31 DIAGNOSIS — M79604 Pain in right leg: Secondary | ICD-10-CM | POA: Diagnosis not present

## 2023-03-31 DIAGNOSIS — M79605 Pain in left leg: Secondary | ICD-10-CM | POA: Diagnosis not present

## 2023-03-31 DIAGNOSIS — G890 Central pain syndrome: Secondary | ICD-10-CM | POA: Diagnosis not present

## 2023-03-31 DIAGNOSIS — M5432 Sciatica, left side: Secondary | ICD-10-CM | POA: Diagnosis not present

## 2023-04-03 ENCOUNTER — Other Ambulatory Visit: Payer: Self-pay | Admitting: Family Medicine

## 2023-04-03 DIAGNOSIS — J449 Chronic obstructive pulmonary disease, unspecified: Secondary | ICD-10-CM

## 2023-04-16 ENCOUNTER — Other Ambulatory Visit: Payer: Self-pay

## 2023-04-16 ENCOUNTER — Telehealth: Payer: Self-pay | Admitting: Family Medicine

## 2023-04-16 MED ORDER — BREZTRI AEROSPHERE 160-9-4.8 MCG/ACT IN AERO: 2.0000 | INHALATION_SPRAY | Freq: Two times a day (BID) | RESPIRATORY_TRACT | 1 refills | Status: DC

## 2023-04-16 NOTE — Telephone Encounter (Signed)
Forms placed in Dr Tabori to be signed folder  

## 2023-04-16 NOTE — Telephone Encounter (Signed)
[facility]  Prescriptions:  Missing Information   Charge sheet attached placed in front bin

## 2023-04-16 NOTE — Telephone Encounter (Signed)
Disregard message . [facility] is [facility] 05/15/23 and [facility] [place] . He only gets his inhaler Markus Daft does the mail order to [facility] . Pharmacy has been updated

## 2023-04-22 DIAGNOSIS — J9600 Acute respiratory failure, unspecified whether with hypoxia or hypercapnia: Secondary | ICD-10-CM | POA: Diagnosis not present

## 2023-04-29 ENCOUNTER — Other Ambulatory Visit: Payer: Self-pay | Admitting: Pharmacist

## 2023-04-29 ENCOUNTER — Telehealth: Payer: Self-pay | Admitting: Internal Medicine

## 2023-04-29 ENCOUNTER — Other Ambulatory Visit: Payer: Medicare Other | Admitting: Pharmacist

## 2023-04-29 NOTE — Telephone Encounter (Signed)
Tammy states patient would like to switch providers. Patient needs appointment scheduled with new provider. Tammy phone number is [phone].

## 2023-04-29 NOTE — Progress Notes (Signed)
04/29/2023 Name: Christopher Pena MRN: [id number] DOB: [birth year]  Chief Complaint  Patient presents with   Medication Management    adherence   Christopher Pena is a 77 y.o. year old male who the Clinical Pharmacist has been following for adherence and assistance with medication access. Spoke with patient for phone visit today.   They were referred to the pharmacist by a quality report for assistance in managing medication access and adherence  Subjective:  Medication management/ Aderence:  Patient has used [facility] in the past because they delivered his medication. He was not using their adherence packaging. [facility] would [facility] [place] [facility] and deliver since he already gets oxycodone filled with them.   Christopher Pena would also like to go back to getting 90 days filled at a time if able.   This patient has also appeared on a report for being at risk of failing the adherence measure for cholesterol (statin) medications this calendar year.   Medication: rosuvastatin 20mg  2024 fill dates: 02/15; 04/11; 05/15,  7/12 and 04/03/2023 filled for 30 days each time.  Adherence has improved over the last 2 to 3 months.  Patient had gotten off on rosuvastatin refills when pharmacy changed from filling 90ds to 30 ds. (It looks like rosuvastatin was possibly filled 90 tablets by [facility] 07/02/2022 and [facility])   COPD:  Current therapy: Breztri - inhaler 2 puff twice a day; Duo Neb - uses up to twice a day - can use up to every 4 hours if needed per instructions. albuterol inhaler as needed;  Uses supplemental O2 - 5L  Patient has received Breztri inhalers from [facility] and Me Program. Received 3 inhalers in July 2024 and reports he received 3 more inhalers last week - around 04/23/2023 [place] for [place] [facility] [place] [facility] 02/07/2023 thru 08/19/2023.  At our last visit, he reported having more difficulty breathing especially when  walking. Dr Christopher Pena recommended he follow up with pulmonology office but patient has not been seen there in over 2 years. I sent referred to [facility] with request to be seen by Dr Christopher Pena if possible but patient states today he has not received call about scheduling appointment yet.    Current Pharmacy:  [phone] [place], [place] - [address] [place] [address] Phone: [phone] Fax: [phone]  [facility] - [place], [place] - [address] [place] [address] Phone: [phone] Fax: [phone]  Patsy Lager [address] [place] [address] Phone: [phone] Fax: [phone]  [facility] [address] [place] [address] Phone: [phone] Fax: [phone]  [facility] - [place], [place] - [address] [place] [address] Phone: [phone] Fax: [phone]   Objective:  Lab Results  Component Value Date   HGBA1C 5.8 (H) 11/12/2022    Lab Results  Component Value Date   CREATININE 0.98 11/20/2022   BUN 16 11/20/2022   NA 134 (L) 11/20/2022   K 4.4 11/20/2022   CL 97 11/20/2022   CO2 29 11/20/2022    Lab Results  Component Value Date   CHOL 140 07/03/2022   HDL 68.60 07/03/2022   LDLCALC 46 07/03/2022   TRIG 130.0 07/03/2022   CHOLHDL 2 07/03/2022  Medications Reviewed Today     Reviewed by Christopher Pena, RPH-CPP (Pharmacist) on 04/29/23 at 1316  Med List Status: <None>   Medication Order Taking? Sig Documenting Provider Last Dose Status Informant  acetaminophen (TYLENOL) 500 MG tablet [id number] Yes Take 500 mg by mouth every 6 (six) hours as needed for headache. [provider] Taking Active Self  albuterol (VENTOLIN HFA) 108 (90 Base) MCG/ACT inhaler [id number] Yes INHALE TWO PUFFS BY MOUTH INTO LUNGS every SIX hours AS NEEDED FOR  WHEEZING AND/OR SHORTNESS OF BREATH Sheliah Hatch, MD Taking Active   aspirin EC 81 MG EC tablet [id number] Yes Take 1 tablet (81 mg total) by mouth daily at 6 (six) AM. Swallow whole. Dara Lords, PA-C Taking Active Self  Budeson-Glycopyrrol-Formoterol (BREZTRI AEROSPHERE) 160-9-4.8 MCG/ACT AERO [id number] Yes Inhale 2 puffs into the lungs 2 (two) times daily. Sheliah Hatch, MD Taking Active   busPIRone (BUSPAR) 7.5 MG tablet [id number] Yes Take 1 tablet (7.5 mg total) by mouth 2 (two) times daily. Sheliah Hatch, MD Taking Active   clopidogrel (PLAVIX) 75 MG tablet [id number] Yes TAKE ONE TABLET BY MOUTH ONCE DAILY Sheliah Hatch, MD Taking Active   docusate sodium (COLACE) 100 MG capsule [id number] Yes Take 100 mg by mouth daily as needed for mild constipation. [provider] Taking Active   guaiFENesin (MUCINEX) 600 MG 12 hr tablet [id number] Yes Take 1 tablet (600 mg total) by mouth 2 (two) times daily. Maretta Bees, MD Taking Active   ipratropium-albuterol (DUONEB) 0.5-2.5 (3) MG/3ML SOLN [id number] Yes INHALE CONTENTS OF 1 VIAL VIA NEBULIZER EVERY 4 HOURS AS NEEDED FOR SHORTNESS OF BREATH Sheliah Hatch, MD Taking Active   naloxone [facility]) nasal spray 4 mg/0.1 mL [id number]  SMARTSIG:In Nostril  Patient not taking: Reported on 02/07/2023   [provider]  Active Self  oxyCODONE (ROXICODONE) 15 MG immediate release tablet [id number] Yes Take 1 tablet (15 mg total) by mouth every 6 (six) hours as needed for pain. Emilie Rutter, PA-C Taking Active   rosuvastatin (CRESTOR) 20 MG tablet [id number] Yes TAKE ONE TABLET BY MOUTH ONCE DAILY Sheliah Hatch, MD Taking Active   sertraline (ZOLOFT) 25 MG tablet [id number] Yes TAKE ONE TABLET BY MOUTH ONCE DAILY Sheliah Hatch, MD Taking Active               Assessment/Plan:   Hyperlipidemia/ASCVD Risk Reduction: -continue rosuvastatin 20mg  daily - Patient will be due to have maintenance  medications filled in the next 5 to 7 days.  - [facility]. [facility] have [facility] all maintenance meds from [facility] except buspirone 7.5mg . Requested updated Rx from Dr Christopher Pena. They also did not have RF remaining on rosuvastatin - RF request also sent to Dr Christopher Pena.   Medication Management:  - [facility] for all meds.  - Also discussed delivery options [facility] - they can mail meds to him and take 2 to 3 days or they have a same day delivery service which costs $9 per episode or if he signs up with [facility] if $5 / month with a monthly $10 back to patient as [employer], then delivery is free. [facility] will [facility] with [facility] regarding his delivery preference.   COPD: -Continue to use Breztri inhaler - 2 inhalation twice a day; 3 more Breztri inhalers were received by patient from [facility] and me last week.  -Reminded to rinse mouth after each use.  -  Continue to use albuterol inhaler or Duo Nebs as needed for shortness of breath.  - Called pulmonology office to check on referral. The referral was hung up because Dr Christopher Pena is not currently accepting new patients. Pulmonology office will contact patient to schedule with another provider after they clear with Dr Sherene Sires that is ok to transfer care to another pulm provider.   Follow Up Plan: 1 month to review adherence.   Christopher Pena, PharmD Clinical Pharmacist [facility]  [facility]

## 2023-04-29 NOTE — Telephone Encounter (Signed)
[facility] 7.5mg  twice a day - if appropriate please send in Rx for 90 day supply.

## 2023-04-30 MED ORDER — BUSPIRONE HCL 7.5 MG PO TABS: 7.5000 mg | ORAL_TABLET | Freq: Two times a day (BID) | ORAL | 1 refills | Status: DC

## 2023-05-01 DIAGNOSIS — G894 Chronic pain syndrome: Secondary | ICD-10-CM | POA: Diagnosis not present

## 2023-05-01 DIAGNOSIS — Z79891 Long term (current) use of opiate analgesic: Secondary | ICD-10-CM | POA: Diagnosis not present

## 2023-05-01 NOTE — Telephone Encounter (Signed)
Kim called patient and patient will see if he can get in sooner somewhere else. Patient will call back if he cannot.

## 2023-05-06 DIAGNOSIS — G890 Central pain syndrome: Secondary | ICD-10-CM | POA: Diagnosis not present

## 2023-05-06 DIAGNOSIS — M545 Low back pain, unspecified: Secondary | ICD-10-CM | POA: Diagnosis not present

## 2023-05-06 DIAGNOSIS — G894 Chronic pain syndrome: Secondary | ICD-10-CM | POA: Diagnosis not present

## 2023-05-06 DIAGNOSIS — M5432 Sciatica, left side: Secondary | ICD-10-CM | POA: Diagnosis not present

## 2023-05-06 DIAGNOSIS — M79604 Pain in right leg: Secondary | ICD-10-CM | POA: Diagnosis not present

## 2023-05-06 DIAGNOSIS — M79605 Pain in left leg: Secondary | ICD-10-CM | POA: Diagnosis not present

## 2023-05-06 DIAGNOSIS — M5136 Other intervertebral disc degeneration, lumbar region: Secondary | ICD-10-CM | POA: Diagnosis not present

## 2023-05-06 DIAGNOSIS — M5431 Sciatica, right side: Secondary | ICD-10-CM | POA: Diagnosis not present

## 2023-05-22 DIAGNOSIS — J9600 Acute respiratory failure, unspecified whether with hypoxia or hypercapnia: Secondary | ICD-10-CM | POA: Diagnosis not present

## 2023-05-27 ENCOUNTER — Telehealth: Payer: Self-pay | Admitting: *Deleted

## 2023-05-27 ENCOUNTER — Other Ambulatory Visit: Payer: Medicare Other | Admitting: Pharmacist

## 2023-05-27 DIAGNOSIS — J9611 Chronic respiratory failure with hypoxia: Secondary | ICD-10-CM

## 2023-05-27 DIAGNOSIS — J449 Chronic obstructive pulmonary disease, unspecified: Secondary | ICD-10-CM

## 2023-05-27 NOTE — Progress Notes (Signed)
Care Coordination   Note   05/27/2023 Name: Christopher Pena MRN: [id number] DOB: 07/19/1946  Christopher Pena is a 77 y.o. year old male who sees Tabori, Helane Rima, MD for primary care. I reached out to Christopher Pena by phone today to offer care coordination services.  Mr. Berardino was given information about Care Coordination services today including:   The Care Coordination services include support from the care team which includes your Nurse Coordinator, Clinical Social Worker, or Pharmacist.  The Care Coordination team is here to help remove barriers to the health concerns and goals most important to you. Care Coordination services are voluntary, and the patient may decline or stop services at any time by request to their care team member.   Care Coordination Consent Status: Patient agreed to services and verbal consent obtained.   Follow up plan:  Telephone appointment with care coordination team member scheduled for:  06/05/2023  Encounter Outcome:  Patient Scheduled from referral   Burman Nieves, [facility] Care Coordination Care Guide Direct Dial: [phone]

## 2023-05-27 NOTE — Progress Notes (Signed)
05/27/2023 Name: Christopher Pena MRN: [id number] DOB: [birth year]  Chief Complaint  Patient presents with   Medication Management   Christopher Pena is a 77 y.o. year old male who the Clinical Pharmacist has been following for adherence and assistance with medication access. Spoke with patient for phone visit today.   They were referred to the pharmacist by a quality report for assistance in managing medication access and adherence  Subjective:  Medication management/ Aderence:  Patient used [facility] in the past because they delivered his medication. He was not using their adherence packaging. [facility] [place] over the last 2 months.   This patient has appeared on a report for being at risk of failing the adherence measure for cholesterol (statin) medications this calendar year. (Current percentage of days covered by statin is estimated at 77%.   Medication: rosuvastatin 20mg  2024 fill dates: 02/15; 04/11; 05/15; 7/12; 08/15 and 05/02/2023 filled for 30 days each time.  Adherence has improved over the last 2 to 3 months.  Patient states he had gotten off on rosuvastatin refills when pharmacy changed from filling 90ds to 30 ds. (It looks like rosuvastatin was possibly filled 90 tablets by [facility] 07/02/20[facility] and [facility])   COPD:  Current therapy: Breztri - inhaler 2 puff twice a day; Duo Neb - uses up to twice a day - can use up to every 4 hours if needed per instructions. albuterol inhaler as needed;  Uses supplemental O2 - 5L  Patient has received Breztri inhalers from [facility] and Me Program. Received 3 inhalers in July 2024  and September 2024. He states he has 4 Breztri inhalers on hand.  [place] for [place] 02/07/2023 thru 08/19/2023.  At our last visit, he reported having more difficulty breathing especially when walking. Dr Beverely Low recommended he follow up with pulmonology office but patient  has not been seen there in over 2 years. Referred to [facility] with request to be seen by Dr Delton Coombes if possible. It looks like [facility] did call patient but they are not able to get him in with new provider until 2025. Patient endorses today he would be willing to see pulmonologist outside of [facility].   Christopher Pena also asks about possible services for in home assistance (cleaning or meal prep). He reports it is getting harder for him to do housework himself. He does have some help from his family.   [facility]  [phone] [place], [place] - [address] [place] [address] Phone: [phone] Fax: [phone]  [facility]atsy Lager [address] [place] [address] Phone: [phone] Fax: [phone]  [facility] - [place], [place] - [address] [place] [address] Phone: [phone] Fax: [phone]   Objective:  Lab Results  Component Value Date   HGBA1C 5.8 (H) 11/12/2022    Lab Results  Component Value Date   CREATININE 0.98 11/20/2022   BUN 16 11/20/2022   NA 134 (L) 11/20/2022   K 4.4 11/20/2022   CL 97 11/20/2022   CO2 29 11/20/2022    Lab Results  Component Value Date   CHOL 140 07/03/2022   HDL 68.60 07/03/2022   LDLCALC 46 07/03/2022   TRIG 130.0 07/03/2022   CHOLHDL 2 07/03/2022    Medications Reviewed Today  Reviewed by Henrene Pastor, RPH-CPP (Pharmacist) on 05/27/23 at 1315  Med List Status: <None>   Medication Order Taking? Sig Documenting Provider Last Dose Status Informant  acetaminophen (TYLENOL) 500 MG tablet [id number] Yes Take 500 mg by mouth every 6 (six) hours as needed for headache. [provider] Taking Active Self  albuterol (VENTOLIN HFA) 108 (90 Base) MCG/ACT inhaler [id number] Yes INHALE TWO PUFFS BY MOUTH INTO LUNGS every SIX hours AS NEEDED FOR WHEEZING AND/OR SHORTNESS OF BREATH Sheliah Hatch, MD Taking  Active   aspirin EC 81 MG EC tablet [id number] Yes Take 1 tablet (81 mg total) by mouth daily at 6 (six) AM. Swallow whole. Dara Lords, PA-C Taking Active Self  Budeson-Glycopyrrol-Formoterol (BREZTRI AEROSPHERE) 160-9-4.8 MCG/ACT AERO [id number] Yes Inhale 2 puffs into the lungs 2 (two) times daily. Sheliah Hatch, MD Taking Active            Med Note [facility]   Tue May 27, 2023  9:54 AM) Receiving from [facility] and Me program thru 08/19/2023  busPIRone (BUSPAR) 7.5 MG tablet [id number] Yes Take 1 tablet (7.5 mg total) by mouth 2 (two) times daily. Sheliah Hatch, MD Taking Active   clopidogrel (PLAVIX) 75 MG tablet [id number] Yes TAKE ONE TABLET BY MOUTH ONCE DAILY Sheliah Hatch, MD Taking Active   docusate sodium (COLACE) 100 MG capsule [id number] Yes Take 100 mg by mouth daily as needed for mild constipation. [provider] Taking Active   guaiFENesin (MUCINEX) 600 MG 12 hr tablet [id number] Yes Take 1 tablet (600 mg total) by mouth 2 (two) times daily. Maretta Bees, MD Taking Active   ipratropium-albuterol (DUONEB) 0.5-2.5 (3) MG/3ML SOLN [id number] Yes INHALE CONTENTS OF 1 VIAL VIA NEBULIZER EVERY 4 HOURS AS NEEDED FOR SHORTNESS OF BREATH Sheliah Hatch, MD Taking Active   naloxone [facility]) nasal spray 4 mg/0.1 mL [id number] Yes  [provider] Taking Active Self  oxyCODONE (ROXICODONE) 15 MG immediate release tablet [id number] Yes Take 1 tablet (15 mg total) by mouth every 6 (six) hours as needed for pain. Emilie Rutter, PA-C Taking Active   rosuvastatin (CRESTOR) 20 MG tablet [id number] Yes TAKE ONE TABLET BY MOUTH ONCE DAILY Sheliah Hatch, MD Taking Active   sertraline (ZOLOFT) 25 MG tablet [id number] Yes TAKE ONE TABLET BY MOUTH ONCE DAILY Sheliah Hatch, MD Taking Active               Assessment/Plan:   Hyperlipidemia/ASCVD Risk Reduction: -continue rosuvastatin 20mg  daily - refill requested with patient's approval. He  will pick up when he fills pain medication later this week.    Medication Management: Adherence improving - reviewed current medication list and refill history. Marland Kitchen   COPD: -Continue to use Breztri inhaler - 2 inhalation twice a day; plan to re-enroll for [place] and [place] next month.   - Reminded to rinse mouth after each use.  - Continue to use albuterol inhaler or Duo Nebs as needed for shortness of breath.  - Will send a referral for pulmonology (OK if outside of [facility] pulmonology)    Will forward referral to care coordination for assistance from either care guide or social worker.    Follow Up Plan: 1 month to review adherence.   Henrene Pastor, PharmD Clinical Pharmacist [facility]  [facility]

## 2023-05-28 ENCOUNTER — Encounter: Payer: Medicare Other | Admitting: Pharmacist

## 2023-05-29 DIAGNOSIS — M5432 Sciatica, left side: Secondary | ICD-10-CM | POA: Diagnosis not present

## 2023-05-29 DIAGNOSIS — M5431 Sciatica, right side: Secondary | ICD-10-CM | POA: Diagnosis not present

## 2023-05-29 DIAGNOSIS — Z79891 Long term (current) use of opiate analgesic: Secondary | ICD-10-CM | POA: Diagnosis not present

## 2023-05-29 DIAGNOSIS — M79605 Pain in left leg: Secondary | ICD-10-CM | POA: Diagnosis not present

## 2023-05-29 DIAGNOSIS — M545 Low back pain, unspecified: Secondary | ICD-10-CM | POA: Diagnosis not present

## 2023-05-29 DIAGNOSIS — G890 Central pain syndrome: Secondary | ICD-10-CM | POA: Diagnosis not present

## 2023-05-29 DIAGNOSIS — M79604 Pain in right leg: Secondary | ICD-10-CM | POA: Diagnosis not present

## 2023-05-29 DIAGNOSIS — M5136 Other intervertebral disc degeneration, lumbar region with discogenic back pain only: Secondary | ICD-10-CM | POA: Diagnosis not present

## 2023-05-29 DIAGNOSIS — G894 Chronic pain syndrome: Secondary | ICD-10-CM | POA: Diagnosis not present

## 2023-06-05 ENCOUNTER — Ambulatory Visit: Payer: Self-pay

## 2023-06-05 NOTE — Patient Outreach (Signed)
Care Coordination   Initial Visit Note   06/05/2023 Name: Christopher Pena MRN: [id number] DOB: 04-Feb-1946  Christopher Pena is a 77 y.o. year old male who sees Tabori, Helane Rima, MD for primary care. I spoke with  Christopher Pena by phone today.  What matters to the patients health and wellness today?  Identify resources to assist with housekeeping and meal preparation.    Goals Addressed             This Visit's Progress    COMPLETED: Care Coordination Activities       Care Coordination Interventions: Discussed options for housekeeping in the home including private pay options and the [place] in home aid program Determined the patients two daughters assist in the home as needed. The patient declines a referral to the in home aid program due to length of current wait list Discussed the patient has recently stopped driving. He uses oxygen and is in a wheelchair. The patient reports his daughters and son-in-laws assist with care needs Education provided on Meals on Wheels program offered by [employer]. The patient is aware there is a wait list and is agreeable to a referral for this program Referral placed to [facility] [place] via secure e-mail Encouraged the patient to contact SW as needed         SDOH assessments and interventions completed:  Yes  SDOH Interventions Today    Flowsheet Row Most Recent Value  SDOH Interventions   Food Insecurity Interventions Other (Comment)  [Referral to meals on wheels]  Housing Interventions Intervention Not Indicated  Transportation Interventions Intervention Not Indicated        Care Coordination Interventions:  Yes, provided   Interventions Today    Flowsheet Row Most Recent Value  Chronic Disease   Chronic disease during today's visit Chronic Obstructive Pulmonary Disease (COPD)  General Interventions   General Interventions Discussed/Reviewed General Interventions Discussed, 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   Encounter Outcome:  Patient Visit Completed   Bevelyn Ngo, BSW, CDP [facility]  [facility], [facility] Social Worker Direct Dial: [phone]  Fax: [phone]

## 2023-06-05 NOTE — Patient Instructions (Signed)
Visit Information  Thank you for taking time to visit with me today. Please don't hesitate to contact me if I can be of assistance to you.   Following are the goals we discussed today:   Goals Addressed             This Visit's Progress    COMPLETED: Care Coordination Activities       Care Coordination Interventions: Discussed options for housekeeping in the home including private pay options and the [place] in home aid program Determined the patients two daughters assist in the home as needed. The patient declines a referral to the in home aid program due to length of current wait list Discussed the patient has recently stopped driving. He uses oxygen and is in a wheelchair. The patient reports his daughters and son-in-laws assist with care needs Education provided on Meals on Wheels program offered by [employer]. The patient is aware there is a wait list and is agreeable to a referral for this program Referral placed to [facility] [place] via secure e-mail Encouraged the patient to contact SW as needed         If you are experiencing a Mental Health or Behavioral Health Crisis or need someone to talk to, please call 1-800-273-TALK (toll free, 24 hour hotline) go to [facility] [address] [place] [phone]) call 911  Patient verbalizes understanding of instructions and care plan provided today and agrees to view in MyChart. Active MyChart status and patient understanding of how to access instructions and care plan via MyChart confirmed with patient.     No further follow up required: Please contact me as needed.  Bevelyn Ngo, BSW, CDP [facility]  [facility], [facility] Social Worker Direct Dial: [phone]  Fax: [phone]

## 2023-06-22 DIAGNOSIS — J9600 Acute respiratory failure, unspecified whether with hypoxia or hypercapnia: Secondary | ICD-10-CM | POA: Diagnosis not present

## 2023-07-01 ENCOUNTER — Other Ambulatory Visit: Payer: Medicare Other | Admitting: Pharmacist

## 2023-07-01 NOTE — Progress Notes (Signed)
   07/01/2023 Name: Christopher Pena MRN: [id number] DOB: Oct 03, 1945  Chief Complaint  Patient presents with   Medication Management   Christopher Pena is a 77 y.o. year old male who the Clinical Pharmacist has been following for adherence and assistance with medication access. Spoke with patient for phone visit today.   They were referred to the pharmacist by a quality report for assistance in managing medication access and adherence  Subjective:  Patient reports he just recently moved to a new apartment but in the same complex due to renovations.   He also has started Hospice care. They will provide him with medication but per patient they do not cover his inhalers.   Medication management/ Aderence:  Patient had appeared on a report for being at risk of failing the adherence measure for cholesterol (statin) medications this calendar year.  Medication: rosuvastatin 20mg  2024 fill dates: 02/15; 04/11; 05/15; 7/12; 08/15 and 05/02/2023 filled for 30 days each time.   Last refill was 05/27/2023 for 90 day supply.  Adherence has improved over the last 3 to 4 months.     COPD:  Current therapy: Breztri - inhaler 2 puff twice a day; Duo Neb - uses up to twice a day - can use up to every 4 hours if needed per instructions. albuterol inhaler as needed;  Uses supplemental O2 - 5L  Patient has received Breztri inhalers from [facility] and Me Program. Received 3 inhalers in July 2024  and September 2024. He states he has 4 Breztri inhalers on hand.  Patient [place] for [place] and [place] Program 02/07/2023 thru 08/19/2023.  [facility]:  [phone] [place], [place] - [address] [place] [address] Phone: [phone] Fax: [phone]  [facility] L[facility] [address] [place] [address] Phone: [phone] Fax: [phone]  [facility] - [place], [place] - [address] [place] [address] Phone:  [phone] Fax: [phone]   Objective:  Lab Results  Component Value Date   HGBA1C 5.8 (H) 11/12/2022    Lab Results  Component Value Date   CREATININE 0.98 11/20/2022   BUN 16 11/20/2022   NA 134 (L) 11/20/2022   K 4.4 11/20/2022   CL 97 11/20/2022   CO2 29 11/20/2022    Lab Results  Component Value Date   CHOL 140 07/03/2022   HDL 68.60 07/03/2022   LDLCALC 46 07/03/2022   TRIG 130.0 07/03/2022   CHOLHDL 2 07/03/2022    Medications Reviewed Today   Medications were not reviewed in this encounter       Assessment/Plan:   Hyperlipidemia/ASCVD Risk Reduction: improved adherence to statin -continue rosuvastatin 20mg  daily .    Medication Management: Adherence improving - reviewed current medication list and refill history. Marland Kitchen   COPD: -Continue to use Breztri inhaler - 2 inhalation twice a day; plan to re-enroll for [place] and [place]. Unable to complete on line re-enrollment today. Will mail patient application. - Reminded to rinse mouth after each use.  - Continue to use albuterol inhaler or Duo Nebs as needed for shortness of breath.   Follow Up Plan: 2 months to check on Breztri patient assistance program   Henrene Pastor, PharmD Clinical Pharmacist [facility]  Population Health

## 2023-08-06 ENCOUNTER — Telehealth: Payer: Self-pay

## 2023-08-06 ENCOUNTER — Telehealth: Payer: Self-pay | Admitting: Family Medicine

## 2023-08-06 NOTE — Telephone Encounter (Signed)
Signed faxed and placed in folder  for Christopher Pena

## 2023-08-06 NOTE — Telephone Encounter (Signed)
Type of form received: AZ&ME prescription savings form  Additional comments:   Received by: [employer] - Front Desk   Form should be Faxed/mailed to: (address/ fax #) [phone]  Is patient requesting call for pickup: No  Form placed:  Labeled & placed in provider bin  Attach charge sheet.  Provider will determine charge.  Individual made aware of 3-5 business day turn around? N/A

## 2023-08-06 NOTE — Telephone Encounter (Signed)
Mailed App to patient's home address for Re-Enroll 2025 Breztri (AZ&ME)

## 2023-08-07 ENCOUNTER — Other Ambulatory Visit: Payer: Self-pay | Admitting: Pharmacist

## 2023-08-07 MED ORDER — BREZTRI AEROSPHERE 160-9-4.8 MCG/ACT IN AERO: 2.0000 | INHALATION_SPRAY | Freq: Two times a day (BID) | RESPIRATORY_TRACT | 3 refills | Status: DC

## 2023-08-19 ENCOUNTER — Other Ambulatory Visit: Payer: Self-pay | Admitting: Family Medicine

## 2023-08-29 ENCOUNTER — Other Ambulatory Visit: Payer: Self-pay | Admitting: Pharmacist

## 2023-08-29 DIAGNOSIS — J449 Chronic obstructive pulmonary disease, unspecified: Secondary | ICD-10-CM

## 2023-09-09 NOTE — Progress Notes (Signed)
08/29/2023 Name: Christopher Pena MRN: [id number] DOB: [birth year]  Chief Complaint  Patient presents with   Medication Management   Christopher Pena is a 78 y.o. year old male who the Clinical Pharmacist has been following for adherence and assistance with medication access. Spoke with patient for phone visit today.   They were referred to the pharmacist by a quality report for assistance in managing medication access and adherence  Subjective: He is enrolled in Hospice care. They provide him with medication but per patient they do not cover his inhalers.   Medication management/ Aderence:  Patient had appeared on a report for being at risk of failing the adherence measure for cholesterol (statin) medications earlier in 2024 but he filled on time at the end of 2024 - looks like he passed MAC for 2024  Medication: rosuvastatin 20mg  2024 fill dates: 02/15; 04/11; 05/15; 7/12; 08/15 and 05/02/2023 filled for 30 days each time.   Filled 05/27/2023 for 90 day supply.   COPD:  Current therapy: Breztri - inhaler 2 puff twice a day; Duo Neb - uses up to twice a day - can use up to every 4 hours if needed per instructions. albuterol inhaler as needed;  Uses supplemental O2 - 5L  Patient has received Breztri inhalers from [facility] and Me Program. Received 3 inhalers in July 2024  and September 2024. He states he has 2 Breztri inhalers on hand.  Patient was approved for [place] and [place] Program 02/07/2023 thru 08/19/2023. Application for 2025 has been submitted.   [facility] Pharmacy:  [phone] [place] - [address] [place] [address] Phone: [phone] Fax: [phone]  [facility]y L[facility] [address] [place] [address] Phone: [phone] Fax: [phone]  [facility] - [place], [place] - [address] [place] [address] Phone: [phone] Fax: [phone]   Objective:  Lab Results  Component  Value Date   HGBA1C 5.8 (H) 11/12/2022    Lab Results  Component Value Date   CREATININE 0.98 11/20/2022   BUN 16 11/20/2022   NA 134 (L) 11/20/2022   K 4.4 11/20/2022   CL 97 11/20/2022   CO2 29 11/20/2022    Lab Results  Component Value Date   CHOL 140 07/03/2022   HDL 68.60 07/03/2022   LDLCALC 46 07/03/2022   TRIG 130.0 07/03/2022   CHOLHDL 2 07/03/2022    Medications Reviewed Today     Reviewed by Henrene Pastor, RPH-CPP (Pharmacist) on 08/29/23 at 1055  Med List Status: <None>   Medication Order Taking? Sig Documenting Provider Last Dose Status Informant  acetaminophen (TYLENOL) 500 MG tablet [id number] Yes Take 500 mg by mouth every 6 (six) hours as needed for headache. [provider] Taking Active Self  albuterol (VENTOLIN HFA) 108 (90 Base) MCG/ACT inhaler [id number] Yes INHALE TWO PUFFS BY MOUTH INTO LUNGS every SIX hours AS NEEDED FOR WHEEZING AND/OR SHORTNESS OF BREATH Sheliah Hatch, MD Taking Active   aspirin EC 81 MG EC tablet [id number] Yes Take 1 tablet (81 mg total) by mouth daily at 6 (six) AM. Swallow whole. Dara Lords, PA-C Taking Active Self  Budeson-Glycopyrrol-Formoterol (BREZTRI AEROSPHERE) 160-9-4.8 MCG/ACT AERO [id number] Yes Inhale 2 puffs into the lungs 2 (two) times daily. Sheliah Hatch, MD Taking Active   busPIRone (BUSPAR) 7.5 MG tablet [id number] Yes Take 1 tablet (7.5 mg total) by mouth 2 (  two) times daily. Sheliah Hatch, MD Taking Active   clopidogrel (PLAVIX) 75 MG tablet [id number] Yes TAKE ONE TABLET BY MOUTH ONCE DAILY Sheliah Hatch, MD Taking Active   docusate sodium (COLACE) 100 MG capsule [id number] Yes Take 100 mg by mouth daily as needed for mild constipation. [provider] Taking Active   guaiFENesin (MUCINEX) 600 MG 12 hr tablet [id number] Yes Take 1 tablet (600 mg total) by mouth 2 (two) times daily. Maretta Bees, MD Taking Active   ipratropium-albuterol (DUONEB) 0.5-2.5 (3) MG/3ML SOLN  [id number] Yes INHALE CONTENTS OF 1 VIAL VIA NEBULIZER EVERY 4 HOURS AS NEEDED FOR SHORTNESS OF BREATH Sheliah Hatch, MD Taking Active   naloxone [facility]) nasal spray 4 mg/0.1 mL [id number] Yes  [provider] Taking Active Self  oxyCODONE (ROXICODONE) 15 MG immediate release tablet [id number] Yes Take 1 tablet (15 mg total) by mouth every 6 (six) hours as needed for pain. Emilie Rutter, PA-C Taking Active   rosuvastatin (CRESTOR) 20 MG tablet [id number] Yes TAKE 1 TABLET BY MOUTH EVERY DAY Sheliah Hatch, MD Taking Active   sertraline (ZOLOFT) 25 MG tablet [id number] Yes TAKE ONE TABLET BY MOUTH ONCE DAILY Sheliah Hatch, MD Taking Active               Assessment/Plan:   Hyperlipidemia/ASCVD Risk Reduction: -continue rosuvastatin 20mg  daily .    Medication Management: Adherence improving - reviewed current medication list and refill history. Marland Kitchen   COPD: -Continue to use Breztri inhaler - 2 inhalation twice a day;   - Called [place] and [place] medication assistance program. Patient needs to provide his consent to re-enroll in program. Plan to assist patient with this.  - Continue to use albuterol inhaler or Duo Nebs as needed for shortness of breath.   Follow Up Plan: next week to assist with re-enrollment.   Henrene Pastor, PharmD Clinical Pharmacist [facility]  [facility]

## 2023-09-11 ENCOUNTER — Telehealth: Payer: Self-pay | Admitting: Pharmacist

## 2023-09-11 ENCOUNTER — Other Ambulatory Visit: Payer: Medicare Other | Admitting: Pharmacist

## 2023-09-11 NOTE — Telephone Encounter (Signed)
Attempt was made to contact patient by phone today for follow up by Clinical Pharmacist regarding [place] and Me medication assistance program (patient needs to provide [place] and Me with consent for re-enrollment - I was planning to assist patient with this today.  Unable to reach patient. LM on VM with my contact number [phone].

## 2023-09-11 NOTE — Telephone Encounter (Signed)
Following up with pt to check if he has mail back his PAP AZ&ME,pt did not answer will try again and follow up.

## 2023-09-17 NOTE — Telephone Encounter (Signed)
Following up on pt PAP re enroll (AZ&ME) application,spoke with pt by phone pt explain he has not had a change to mail the  application back,pt still has the application and will mail back today or tomorrow.

## 2023-09-25 ENCOUNTER — Other Ambulatory Visit: Payer: Self-pay | Admitting: Family Medicine

## 2023-09-25 DIAGNOSIS — F419 Anxiety disorder, unspecified: Secondary | ICD-10-CM

## 2023-10-01 ENCOUNTER — Telehealth: Payer: Self-pay

## 2023-10-01 NOTE — Telephone Encounter (Signed)
Spoke to Patient about returning APP for 2025 re-enroll with AZ&ME. He gave it to his Daughter to complete- advised to check and mail back to [place] if interested.

## 2023-10-01 NOTE — Telephone Encounter (Signed)
Following up on pt PAP AZ&ME,Spoke with pt explain RPH Tammy mail pt application a couple of weeks ago pt daughter has mail it back and is waiting on AZ&ME respond.

## 2023-10-07 NOTE — Telephone Encounter (Signed)
Per AZ&ME pt has been APPROVED for 2025

## 2023-10-28 ENCOUNTER — Other Ambulatory Visit: Payer: Self-pay | Admitting: Family Medicine

## 2023-11-18 DEATH — deceased

## 2024-01-06 ENCOUNTER — Encounter

## 2024-05-27 ENCOUNTER — Telehealth: Payer: Self-pay

## 2024-05-27 NOTE — Telephone Encounter (Signed)
 Gave pt a call,pt is coming up due for reenrollment, left a HIPAA VM will mail out pt portion and faxed provider portion.

## 2024-07-06 ENCOUNTER — Other Ambulatory Visit: Payer: Self-pay | Admitting: Pharmacist
